# Patient Record
Sex: Male | Born: 1955 | Race: White | Hispanic: No | Marital: Married | Smoking: Never smoker
Health system: Southern US, Community
[De-identification: ages and names within clinical notes are randomized; demographics above are authoritative.]

## PROBLEM LIST (undated history)

## (undated) DIAGNOSIS — I10 Essential (primary) hypertension: Secondary | ICD-10-CM

## (undated) DIAGNOSIS — E119 Type 2 diabetes mellitus without complications: Secondary | ICD-10-CM

## (undated) DIAGNOSIS — M5010 Cervical disc disorder with radiculopathy, unspecified cervical region: Secondary | ICD-10-CM

## (undated) DIAGNOSIS — C187 Malignant neoplasm of sigmoid colon: Secondary | ICD-10-CM

## (undated) DIAGNOSIS — Z95828 Presence of other vascular implants and grafts: Secondary | ICD-10-CM

## (undated) DIAGNOSIS — G5600 Carpal tunnel syndrome, unspecified upper limb: Secondary | ICD-10-CM

## (undated) HISTORY — DX: Malignant neoplasm of sigmoid colon: C18.7

## (undated) HISTORY — PX: TONSILLECTOMY: SUR1361

## (undated) HISTORY — PX: NO PAST SURGERIES: SHX2092

## (undated) HISTORY — DX: Cervical disc disorder with radiculopathy, unspecified cervical region: M50.10

## (undated) HISTORY — DX: Carpal tunnel syndrome, unspecified upper limb: G56.00

## (undated) HISTORY — DX: Essential (primary) hypertension: I10

---
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
## 2003-07-05 ENCOUNTER — Other Ambulatory Visit: Admission: RE | Admit: 2003-07-05 | Discharge: 2003-07-05 | Payer: Self-pay | Admitting: General Surgery

## 2004-08-15 ENCOUNTER — Ambulatory Visit (HOSPITAL_COMMUNITY): Admission: RE | Admit: 2004-08-15 | Discharge: 2004-08-15 | Payer: Self-pay | Admitting: Internal Medicine

## 2006-08-25 ENCOUNTER — Encounter (INDEPENDENT_AMBULATORY_CARE_PROVIDER_SITE_OTHER): Payer: Self-pay | Admitting: General Surgery

## 2006-08-25 ENCOUNTER — Ambulatory Visit (HOSPITAL_COMMUNITY): Admission: RE | Admit: 2006-08-25 | Discharge: 2006-08-25 | Payer: Self-pay | Admitting: General Surgery

## 2007-04-01 ENCOUNTER — Ambulatory Visit (HOSPITAL_COMMUNITY): Admission: RE | Admit: 2007-04-01 | Discharge: 2007-04-01 | Payer: Self-pay | Admitting: Family Medicine

## 2007-11-09 ENCOUNTER — Ambulatory Visit (HOSPITAL_COMMUNITY): Admission: RE | Admit: 2007-11-09 | Discharge: 2007-11-09 | Payer: Self-pay | Admitting: Urology

## 2010-02-09 HISTORY — PX: OTHER SURGICAL HISTORY: SHX169

## 2010-03-19 ENCOUNTER — Emergency Department (HOSPITAL_COMMUNITY): Payer: 59

## 2010-03-19 ENCOUNTER — Ambulatory Visit (HOSPITAL_COMMUNITY)
Admission: EM | Admit: 2010-03-19 | Discharge: 2010-03-19 | Disposition: A | Discharge status: Home / Self Care | Payer: 59 | Attending: General Surgery | Admitting: General Surgery

## 2010-03-19 ENCOUNTER — Other Ambulatory Visit: Payer: Self-pay | Admitting: General Surgery

## 2010-03-19 DIAGNOSIS — K3580 Unspecified acute appendicitis: Secondary | ICD-10-CM | POA: Insufficient documentation

## 2010-03-19 DIAGNOSIS — Z79899 Other long term (current) drug therapy: Secondary | ICD-10-CM | POA: Insufficient documentation

## 2010-03-19 DIAGNOSIS — I10 Essential (primary) hypertension: Secondary | ICD-10-CM | POA: Insufficient documentation

## 2010-03-19 LAB — COMPREHENSIVE METABOLIC PANEL WITH GFR
AST: 23 U/L (ref 0–37)
Albumin: 3.6 g/dL (ref 3.5–5.2)
Alkaline Phosphatase: 47 U/L (ref 39–117)
BUN: 14 mg/dL (ref 6–23)
Calcium: 9.2 mg/dL (ref 8.4–10.5)
Creatinine, Ser: 1.18 mg/dL (ref 0.4–1.5)
GFR calc Af Amer: >60 mL/min (ref >60)
Glucose, Bld: 220 mg/dL — ABNORMAL HIGH (ref 70–99)
Potassium: 4.7 meq/L (ref 3.5–5.1)
Total Bilirubin: 1.1 mg/dL (ref 0.3–1.2)
Total Protein: 6.2 g/dL (ref 6.0–8.3)

## 2010-03-19 LAB — CBC
HCT: 35.2 % — ABNORMAL LOW (ref 39.0–52.0)
Hemoglobin: 12.4 g/dL — ABNORMAL LOW (ref 13.0–17.0)
MCH: 28.9 pg (ref 26.0–34.0)
MCHC: 35.2 g/dL (ref 30.0–36.0)
MCV: 82.1 fL (ref 78.0–100.0)
Platelets: 142 K/uL — ABNORMAL LOW (ref 150–400)
RBC: 4.29 MIL/uL (ref 4.22–5.81)
RDW: 14.1 % (ref 11.5–15.5)
WBC: 13.5 10*3/uL — ABNORMAL HIGH (ref 4.0–10.5)

## 2010-03-19 LAB — URINALYSIS, ROUTINE W REFLEX MICROSCOPIC
Bilirubin Urine: NEGATIVE
Hgb urine dipstick: NEGATIVE
Ketones, ur: NEGATIVE mg/dL
Nitrite: NEGATIVE
Protein, ur: NEGATIVE mg/dL
Specific Gravity, Urine: 1.025 (ref 1.005–1.030)
Urine Glucose, Fasting: NEGATIVE mg/dL
Urobilinogen, UA: 0.2 mg/dL (ref 0.0–1.0)
pH: 6 (ref 5.0–8.0)

## 2010-03-19 LAB — DIFFERENTIAL
Basophils Absolute: 0 K/uL (ref 0.0–0.1)
Basophils Relative: 0 % (ref 0–1)
Eosinophils Absolute: 0 10*3/uL (ref 0.0–0.7)
Eosinophils Relative: 0 % (ref 0–5)
Lymphocytes Relative: 5 % — ABNORMAL LOW (ref 12–46)
Lymphs Abs: 0.6 K/uL — ABNORMAL LOW (ref 0.7–4.0)
Monocytes Absolute: 0.6 10*3/uL (ref 0.1–1.0)
Monocytes Relative: 4 % (ref 3–12)
Neutro Abs: 12.3 K/uL — ABNORMAL HIGH (ref 1.7–7.7)
Neutrophils Relative %: 91 % — ABNORMAL HIGH (ref 43–77)

## 2010-03-19 LAB — COMPREHENSIVE METABOLIC PANEL
ALT: 31 U/L (ref 0–53)
CO2: 27 mEq/L (ref 19–32)
Chloride: 100 mEq/L (ref 96–112)
GFR calc non Af Amer: >60 mL/min (ref >60)
Sodium: 137 mEq/L (ref 135–145)

## 2010-03-19 LAB — LIPASE, BLOOD: Lipase: 35 U/L (ref 11–59)

## 2010-03-19 MED ORDER — IOHEXOL 300 MG/ML  SOLN
100.0000 mL | Freq: Once | INTRAMUSCULAR | Status: AC | PRN
  Administered 2010-03-19: 100 mL via INTRAVENOUS

## 2010-03-20 LAB — URINE CULTURE
Colony Count: NO GROWTH
Culture  Setup Time: 201202081410
Culture: NO GROWTH

## 2010-03-23 NOTE — Op Note (Signed)
NAME:  Troy Smith, Troy Smith NO.:  [id number]  MEDICAL RECORD NO.:  [id number]           PATIENT TYPE:  O  LOCATION:  DAYP                          FACILITY:  [facility]  PHYSICIAN:  Dalia Heading, M.D.  DATE OF BIRTH:  07/11/1955  DATE OF PROCEDURE:  03/19/2010 DATE OF DISCHARGE:                              OPERATIVE REPORT   PREOPERATIVE DIAGNOSIS:  Acute appendicitis.  POSTOPERATIVE DIAGNOSIS:  Acute appendicitis.  PROCEDURE:  Laparoscopic appendectomy.  SURGEON:  Dalia Heading, M.D.  ANESTHESIA:  General endotracheal.  INDICATIONS:  The patient is a 55 year old white male who presents with a 24-hour history of worsening right lower quadrant abdominal pain.  CT scan of the abdomen and pelvis reveals acute appendicitis.  The patient now comes to the operating for laparoscopic appendectomy.  The risks and benefits of the procedure including bleeding, infection, and the possibility of an open procedure were fully explained to the patient, gave informed consent.  PROCEDURE NOTE:  The patient was placed in the supine position.  After induction of general endotracheal anesthesia, the abdomen was prepped and draped using the usual sterile technique with DuraPrep.  Surgical site confirmation was performed.  A supraumbilical incision was made down the fascia.  A Veress needle was introduced into the abdominal cavity and confirmation of placement was done using the saline drop test.  The abdomen was then insufflated to 16 mmHg pressure.  An 11-mm trocar was introduced into the abdominal cavity under direct visualization without difficulty.  The patient was placed in deeper Trendelenburg position and additional 5-mm trocar was placed in the right lower quadrant and left lower quadrant regions and a 12-mm trocar was placed in the suprapubic region.  The appendix was visualized and noted be somewhat retrocecal in nature.  The mesentery was divided using the harmonic  scalpel.  There was some serous fluid around this, though there was no evidence of free perforation of the appendix.  The inflammatory portion was the distal half of the appendix.  The dissection was taken to the base of the appendix and a vascular Endo-GIA was placed across the base of the appendix and fired.  The appendix was then removed using an EndoCatch bag and sent to Pathology for further examination.  The staple line was inspected and noted to be within normal limits.  The right lower quadrant was copiously irrigated with normal saline.  Some Surgicel was placed into the area where it had been attached in the retrocecal fashion.  All air and fluid were then evacuate from the abdominal cavity prior to removal of the trocars.  All wounds were irrigated with normal saline.  All wounds were injected with 0.5% Sensorcaine.  The supraumbilical fascia was reapproximated using an 0 Vicryl interrupted suture.  All skin incisions were closed using staples.  Betadine ointment, dry sterile dressings were applied.  All tape and needle counts were correct at the end of the procedure. The patient was extubated in the operating room and went back to recovery room in awake and stable condition.  COMPLICATIONS:  None.  SPECIMEN:  None.  ESTIMATED BLOOD LOSS:  Minimal.  Dalia Heading, M.D.     MAJ/MEDQ  D:  03/19/2010  T:  03/20/2010  Job:  [id number]  cc:   Madelin Rear. Sherwood Gambler, MD Fax: [phone]  Electronically Signed by Franky Macho M.D. on 03/21/2010 01:24:58 PM

## 2010-06-24 NOTE — H&P (Signed)
NAME:  Troy Smith, Troy Smith NO.:  [id number]   MEDICAL RECORD NO.:  [id number]          PATIENT TYPE:  AMB   LOCATION:  DAY                           FACILITY:  [facility]   PHYSICIAN:  Dalia Heading, M.D.  DATE OF BIRTH:  Sep 24, 1955   DATE OF ADMISSION:  DATE OF DISCHARGE:  LH                              HISTORY & PHYSICAL   CHIEF COMPLAINT:  Need for screening colonoscopy.   HISTORY OF PRESENT ILLNESS:  Patient is a 55 year old white male who was  referred for endoscopic evaluation.  He needs a colonoscopy for  screening purposes.  No abdominal pain, weight loss, nausea, vomiting,  diarrhea, constipation, melena, hematochezia have been noted.  He has  never had a colonoscopy.  There is no family history of colon carcinoma.   PAST MEDICAL HISTORY:  Hypertension.   PAST SURGICAL HISTORY:  Unremarkable.   CURRENT MEDICATIONS:  Zestril.   ALLERGIES:  No known drug allergies.   REVIEW OF SYSTEMS:  Noncontributory.   PHYSICAL EXAMINATION:  GENERAL:  Patient is a well-developed, well-  nourished white male in no acute distress.  LUNGS:  Clear to auscultation with equal breath sounds bilaterally.  HEART:  Examination reveals a regular rate and rhythm without S3, S4,  murmurs.  ABDOMEN:  Soft, nontender, nondistended.  No hepatosplenomegaly or  masses are noted.  RECTAL:  Examination was deferred to the procedure.   IMPRESSION:  Need for screening colonoscopy.   PLAN:  The patient is scheduled for a colonoscopy on August 25, 2006.  The  risks and benefits of the procedure including bleeding and perforation  were fully explained to the patient, gave informed consent.      Dalia Heading, M.D.  Electronically Signed     MAJ/MEDQ  D:  08/03/2006  T:  08/03/2006  Job:  [id number]   cc:   Mila Homer. Sudie Bailey, M.D.  Fax: [phone]

## 2010-08-28 ENCOUNTER — Ambulatory Visit (HOSPITAL_COMMUNITY)
Admission: RE | Admit: 2010-08-28 | Discharge: 2010-08-28 | Disposition: A | Discharge status: Home / Self Care | Payer: 59 | Source: Ambulatory Visit | Attending: Family Medicine | Admitting: Family Medicine

## 2010-08-28 ENCOUNTER — Other Ambulatory Visit (HOSPITAL_COMMUNITY): Payer: Self-pay | Admitting: Family Medicine

## 2010-08-28 DIAGNOSIS — M25579 Pain in unspecified ankle and joints of unspecified foot: Secondary | ICD-10-CM

## 2010-08-28 DIAGNOSIS — M25473 Effusion, unspecified ankle: Secondary | ICD-10-CM | POA: Insufficient documentation

## 2010-08-28 DIAGNOSIS — M25476 Effusion, unspecified foot: Secondary | ICD-10-CM | POA: Insufficient documentation

## 2012-03-17 ENCOUNTER — Ambulatory Visit (HOSPITAL_COMMUNITY)
Admission: RE | Admit: 2012-03-17 | Discharge: 2012-03-17 | Disposition: A | Discharge status: Home / Self Care | Payer: 59 | Source: Ambulatory Visit | Attending: Family Medicine | Admitting: Family Medicine

## 2012-03-17 ENCOUNTER — Other Ambulatory Visit (HOSPITAL_COMMUNITY): Payer: Self-pay | Admitting: Family Medicine

## 2012-03-17 DIAGNOSIS — R05 Cough: Secondary | ICD-10-CM

## 2012-03-17 DIAGNOSIS — R059 Cough, unspecified: Secondary | ICD-10-CM | POA: Insufficient documentation

## 2014-03-06 ENCOUNTER — Other Ambulatory Visit (HOSPITAL_COMMUNITY): Payer: Self-pay | Admitting: Physical Medicine and Rehabilitation

## 2014-03-06 DIAGNOSIS — R52 Pain, unspecified: Secondary | ICD-10-CM

## 2014-03-13 ENCOUNTER — Other Ambulatory Visit (HOSPITAL_COMMUNITY): Payer: Self-pay | Admitting: Family Medicine

## 2014-03-13 DIAGNOSIS — R410 Disorientation, unspecified: Secondary | ICD-10-CM

## 2014-03-13 DIAGNOSIS — R51 Headache: Principal | ICD-10-CM

## 2014-03-13 DIAGNOSIS — R519 Headache, unspecified: Secondary | ICD-10-CM

## 2014-03-13 DIAGNOSIS — H53459 Other localized visual field defect, unspecified eye: Secondary | ICD-10-CM

## 2014-03-14 ENCOUNTER — Ambulatory Visit (HOSPITAL_COMMUNITY)
Admission: RE | Admit: 2014-03-14 | Discharge: 2014-03-14 | Disposition: A | Discharge status: Home / Self Care | Payer: 59 | Source: Ambulatory Visit | Attending: Physical Medicine and Rehabilitation | Admitting: Physical Medicine and Rehabilitation

## 2014-03-14 ENCOUNTER — Ambulatory Visit (HOSPITAL_COMMUNITY)
Admission: RE | Admit: 2014-03-14 | Discharge: 2014-03-14 | Disposition: A | Discharge status: Home / Self Care | Payer: 59 | Source: Ambulatory Visit | Attending: Family Medicine | Admitting: Family Medicine

## 2014-03-14 DIAGNOSIS — R200 Anesthesia of skin: Secondary | ICD-10-CM | POA: Diagnosis not present

## 2014-03-14 DIAGNOSIS — R519 Headache, unspecified: Secondary | ICD-10-CM

## 2014-03-14 DIAGNOSIS — H539 Unspecified visual disturbance: Secondary | ICD-10-CM | POA: Insufficient documentation

## 2014-03-14 DIAGNOSIS — M542 Cervicalgia: Secondary | ICD-10-CM | POA: Insufficient documentation

## 2014-03-14 DIAGNOSIS — R52 Pain, unspecified: Secondary | ICD-10-CM

## 2014-03-14 DIAGNOSIS — R410 Disorientation, unspecified: Secondary | ICD-10-CM | POA: Diagnosis not present

## 2014-03-14 DIAGNOSIS — R51 Headache: Secondary | ICD-10-CM | POA: Insufficient documentation

## 2014-03-14 DIAGNOSIS — H53459 Other localized visual field defect, unspecified eye: Secondary | ICD-10-CM

## 2014-03-14 LAB — POCT I-STAT CREATININE: CREATININE: 1.6 mg/dL — AB (ref 0.50–1.35)

## 2014-03-14 MED ORDER — GADOBENATE DIMEGLUMINE 529 MG/ML IV SOLN
10.0000 mL | Freq: Once | INTRAVENOUS | Status: AC | PRN
  Administered 2014-03-14: 10 mL via INTRAVENOUS

## 2014-05-02 ENCOUNTER — Encounter: Payer: Self-pay | Admitting: Neurology

## 2014-05-02 ENCOUNTER — Ambulatory Visit (INDEPENDENT_AMBULATORY_CARE_PROVIDER_SITE_OTHER): Payer: 59 | Admitting: Neurology

## 2014-05-02 ENCOUNTER — Ambulatory Visit (INDEPENDENT_AMBULATORY_CARE_PROVIDER_SITE_OTHER): Payer: Self-pay | Admitting: Neurology

## 2014-05-02 DIAGNOSIS — G5601 Carpal tunnel syndrome, right upper limb: Secondary | ICD-10-CM

## 2014-05-02 DIAGNOSIS — M5010 Cervical disc disorder with radiculopathy, unspecified cervical region: Secondary | ICD-10-CM

## 2014-05-02 DIAGNOSIS — G5600 Carpal tunnel syndrome, unspecified upper limb: Secondary | ICD-10-CM

## 2014-05-02 DIAGNOSIS — G5602 Carpal tunnel syndrome, left upper limb: Secondary | ICD-10-CM

## 2014-05-02 DIAGNOSIS — M5033 Other cervical disc degeneration, cervicothoracic region: Secondary | ICD-10-CM

## 2014-05-02 DIAGNOSIS — G5603 Carpal tunnel syndrome, bilateral upper limbs: Secondary | ICD-10-CM

## 2014-05-02 DIAGNOSIS — M4723 Other spondylosis with radiculopathy, cervicothoracic region: Secondary | ICD-10-CM

## 2014-05-02 HISTORY — DX: Cervical disc disorder with radiculopathy, unspecified cervical region: M50.10

## 2014-05-02 HISTORY — DX: Carpal tunnel syndrome, unspecified upper limb: G56.00

## 2014-05-02 NOTE — Procedures (Signed)
     HISTORY:  Troy Smith is a 59 year old gentleman with a history of diabetes. He reports onset of neck pain and left arm discomfort that began in December 2015. The patient began having numbness down the left arm and hand in February 2016. He is being evaluated for a possible neuropathy or a cervical radiculopathy.  NERVE CONDUCTION STUDIES:  Nerve conduction studies were performed on both upper extremities. The distal motor latencies for the median nerves were prolonged bilaterally, with a borderline normal motor amplitude on the left, normal on the right. The distal motor latencies for the ulnar nerves were normal bilaterally, with a low motor amplitude on the left, normal on the right. The F wave latencies for the median nerves were borderline normal on the left, normal on the right, and normal for the ulnar nerves bilaterally. The sensory latencies for the median nerves were prolonged bilaterally, normal for the ulnar nerves bilaterally.    EMG STUDIES:  EMG study was performed on the left upper extremity:  The first dorsal interosseous muscle reveals 2 to 3 K units with decreased recruitment. 2+ fibrillations and positive waves were noted. The abductor pollicis brevis muscle reveals 2 to 3 K units with mildly decreased recruitment. 1+ fibrillations and positive waves were noted. The extensor indicis proprius muscle reveals 1 to 3 K units with decreased recruitment. 3+ fibrillations and positive waves were noted. The pronator teres muscle reveals 2 to 3 K units with full recruitment. No fibrillations or positive waves were noted. The biceps muscle reveals 1 to 2 K units with full recruitment. No fibrillations or positive waves were noted. The triceps muscle reveals 2 to 4 K units with full recruitment. No fibrillations or positive waves were noted. The anterior deltoid muscle reveals 2 to 3 K units with full recruitment. No fibrillations or positive waves were noted at the lower  level, 1+ positive waves seen at the upper level. The cervical paraspinal muscles were tested at 2 levels. No abnormalities of insertional activity were seen at either level tested. There was good relaxation.   IMPRESSION:  Nerve conduction studies done on both upper extremities shows evidence of bilateral carpal tunnel syndrome of mild severity. There appears to be low motor amplitudes for the left ulnar nerve as well. EMG evaluation of the left upper extremity shows distal acute denervation with some minimal acute denervation in the upper cervical paraspinal muscles as well. This study is most consistent with a left C8 radiculopathy. Cannot completely rule out an overlying left ulnar neuropathy as well. Clinical correlation is required.   Jill Alexanders MD 05/02/2014 4:33 PM  [facility] [address] [place], [place] [address]  Phone [phone] Fax [phone]

## 2014-05-02 NOTE — Progress Notes (Signed)
Please refer to EMG and nerve conduction study procedure note. 

## 2014-05-04 ENCOUNTER — Ambulatory Visit (HOSPITAL_COMMUNITY): Payer: 59 | Admitting: Occupational Therapy

## 2014-05-11 ENCOUNTER — Encounter (HOSPITAL_COMMUNITY): Payer: Self-pay | Admitting: Occupational Therapy

## 2014-05-11 ENCOUNTER — Ambulatory Visit (HOSPITAL_COMMUNITY): Payer: 59 | Attending: Physical Medicine and Rehabilitation | Admitting: Occupational Therapy

## 2014-05-11 DIAGNOSIS — M25512 Pain in left shoulder: Secondary | ICD-10-CM

## 2014-05-11 DIAGNOSIS — M4802 Spinal stenosis, cervical region: Secondary | ICD-10-CM

## 2014-05-11 DIAGNOSIS — M542 Cervicalgia: Secondary | ICD-10-CM | POA: Diagnosis not present

## 2014-05-11 DIAGNOSIS — M6281 Muscle weakness (generalized): Secondary | ICD-10-CM

## 2014-05-11 NOTE — Therapy (Signed)
[place], [place], [address] Phone: [phone]   Fax:  [phone]  Occupational Therapy Evaluation  Patient Details  Name: Troy Smith MRN: [id number] Date of Birth: [birth year] Referring Provider:  Drema Dallas,*  Encounter Date: 05/11/2014      OT End of Session - 05/11/14 1524    Visit Number 1   Number of Visits 16   Date for OT Re-Evaluation 07/10/14  Mini-reassessment 06/09/14   Authorization Type UHC   OT Start Time 1430   OT Stop Time 1505   OT Time Calculation (min) 35 min   Activity Tolerance Patient tolerated treatment well   Behavior During Therapy [facility] for tasks assessed/performed      Past Medical History  Diagnosis Date  . Carpal tunnel syndrome 05/02/2014    Bilateral  . 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  There were no vitals filed for this visit.  Visit Diagnosis:  Spinal stenosis in cervical region  Left shoulder pain  Cervical pain (neck)  Muscle weakness of left upper extremity      Subjective Assessment - 05/11/14 1510    Symptoms S: The pain gets worse throughout the day.    Pertinent History Pt is a 59 y/o male diagnosed with stenosis and left shoulder pain, presenting with increased pain & fascial restrictions, decreased shoulder range of motion, decreased grip and pinch strength limiting ability to complete work tasks as he usually would. Pt reports he wakes up with pain around his shoulder blade and in neck region at 1/10 and it gradually progresses throughout the day. Pt reports no change in ability to complete B/IADLs, however the pain is limiting. Pt reports stretching, medication, & ice helps to relieve the pain. Dr. Niel Hummer referred pt to occupational therapy for evaluation and treatment.     Special Tests FOTO Score: 73/100 (27% impairment)   Patient Stated Goals  To be able to hold onto my instruments without dropping them.    Currently in Pain? Yes   Pain Score 4    Pain Location Shoulder  neck   Pain Orientation Left   Pain Descriptors / Indicators Aching   Pain Type Chronic pain           [facility] OT Assessment - 05/11/14 1430    Assessment   Diagnosis Stenosis & left shoulder pain   Onset Date 11/09/13   Prior Therapy None   Precautions   Precautions None   Balance Screen   Has the patient fallen in the past 6 months No   Has the patient had a decrease in activity level because of a fear of falling?  No   Is the patient reluctant to leave their home because of a fear of falling?  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    Leisure going to gym, watching tv   ADL   ADL comments Pt continues to be independent in B/IADLs, pain creates difficulty during work tasks, gym workouts, and routine house keeping. Pt reports difficulty holding onto dental instruments, and experiences discomfort when bending over patients.    Written Expression   Dominant  Hand Right   Vision - History   Baseline Vision No visual deficits   Cognition   Overall Cognitive Status Within Functional Limits for tasks assessed   ROM / Strength   AROM / PROM / Strength AROM;Strength   Palpation   Palpation Mod-max fascial restrictions in left biceps, upper arm, trapezius, scapularis, and bilateral cervical regions   AROM   Overall AROM Comments Assessed in standing, IR/ER adducted    AROM Assessment Site Shoulder   Right/Left Shoulder Left   Left Shoulder Flexion 145 Degrees   Left Shoulder ABduction 160 Degrees   Left Shoulder Internal Rotation 90 Degrees   Left Shoulder External Rotation 85 Degrees   Cervical Flexion 3cm   Cervical  Extension 2cm   Cervical - Right Side Bend 12cm   Cervical - Left Side Bend 11cm   Cervical - Right Rotation 14cm   Cervical - Left Rotation 15cm   Strength   Strength Assessment Site Shoulder   Right/Left Shoulder Left   Left Shoulder Flexion 4+/5   Left Shoulder ABduction 4+/5   Left Shoulder Internal Rotation 4/5   Left Shoulder External Rotation 4/5   Grip (lbs) R: 113   Right Hand Lateral Pinch 22 lbs   Right Hand 3 Point Pinch 19 lbs   Grip (lbs) L: 56   Left Hand Lateral Pinch 9 lbs   Left Hand 3 Point Pinch 8 lbs            OT Education - 05/11/14 1524    Education provided Yes   Education Details Cervical AROM, shoulder stretches   Person(s) Educated Patient   Methods Explanation;Demonstration;Handout   Comprehension Verbalized understanding;Returned demonstration          OT Short Term Goals - 05/11/14 1532    OT SHORT TERM GOAL #1   Title Pt will be educated on HEP.    Time 4   Period Weeks   Status New   OT SHORT TERM GOAL #2   Title Pt will decrease fascial restrictions in LUE & cervical region from max to mod amount to decrease pain.    Time 4   Period Weeks   Status New   OT SHORT TERM GOAL #3   Title Pt will increase AROM of LUE to [facility] to increase ability to complete daily and leisure tasks without pain.    Time 4   Period Weeks   Status New   OT SHORT TERM GOAL #4   Title Pt will decrease pain to 4/10 or less when completing daily activities.    Time 4   Period Weeks   Status New   OT SHORT TERM GOAL #5   Title Pt will increase LUE grip strength by 10# and pinch strength by 4# to increase ability to complete work tasks.    Time 4   Period Weeks   Status New           OT Long Term Goals - 05/11/14 1536    OT LONG TERM GOAL #1   Title Pt will return to prior level of functioning and independence in all B/IADL tasks.    Time 8   Period Weeks   Status New   OT LONG TERM GOAL #2   Title Pt will decrease pain to 2/10 or less during  all daily activities.    Time 8   Period Weeks   Status New   OT LONG TERM GOAL #3   Title Pt will increase LUE  grip strength by 15# and pinch strength by 7# to increase ability to hold onto dental instruments throughout the day.    Time 8   Period Weeks   Status New   OT LONG TERM GOAL #4   Title Pt will decrease fascial restrictions in LUE & cervical region from mod to min amounts or less.    Time 8   Period Weeks   Status New   OT LONG TERM GOAL #5   Title Pt will increase cervical AROM to WNL to increase ability to look over shoulder when driving without pain.    Time 8   Period Weeks   Status New               Plan - 05/11/14 1525    Clinical Impression Statement A: Pt is 59 y/o male presenting with increased pain and fascial restrictions, decreased shoulder range of motion, and decreased grip and pinch strength in LUE limiting ability to complete daily tasks in a normal way. Pt reports he experiences increased pain during work tasks and housekeeping tasks, as well as tingling and numbness along ulnar side of LUE at times. Pt continues to go to the gym, however is limited in ability to complete exercises due to pain. Dr. Niel Hummer referred pt to occupational therapy for evaluation and treatment.     Pt will benefit from skilled therapeutic intervention in order to improve on the following deficits (Retired) Decreased strength;Impaired sensation;Pain;Impaired UE functional use;Decreased activity tolerance;Decreased range of motion;Increased fascial restricitons;Improper body mechanics;Decreased coordination   Rehab Potential Good   OT Frequency 2x / week   OT Duration 8 weeks   OT Treatment/Interventions Self-care/ADL training;Passive range of motion;Patient/family education;Cryotherapy;Electrical Stimulation;Therapeutic exercise;Moist Heat;Manual Therapy;Therapeutic activities   Plan P: 9 hole peg test. Theraputty HEP. Skilled OT intervention to decrease pain and  restrictions and improve pain free mobility in order to return prior level of independence with B/IADLs and leisure activites. Treatment Plan: MFR and manual stretching, P/AROM exercises, grip and pinch strengthening exercises, general LUE strengthening, cervical AROM/stretching.    OT Home Exercise Plan Cervical AROM, shoulder stretches   Consulted and Agree with Plan of Care Patient        Problem List Patient Active Problem List   Diagnosis Date Noted  . Carpal tunnel syndrome 05/02/2014  . Cervical disc disorder with radiculopathy of cervical region 05/02/2014    Guadelupe Sabin, OTR/L  [phone]  05/11/2014, 3:47 PM  [place], [place], [address] Phone: [phone]   Fax:  [phone]

## 2014-05-11 NOTE — Patient Instructions (Signed)
  AROM: Lateral Neck Flexion   Slowly tilt head toward one shoulder, then the other. Hold each position __5__ seconds. Repeat __10__ times per set. Do _1___ sets per session. Do _1-2___ sessions per day.  http://orth.exer.us/296   Copyright  VHI. All rights reserved.  AROM: Neck Extension   Bend head backward. Hold __5__ seconds. Repeat _10___ times per set. Do __1__ sets per session. Do _1-2___ sessions per day.  http://orth.exer.us/300   Copyright  VHI. All rights reserved.  AROM: Neck Flexion   Bend head forward. Hold __5__ seconds. Repeat _10___ times per set. Do __1__ sets per session. Do _1-2___ sessions per day.  http://orth.exer.us/298   Copyright  VHI. All rights reserved.  AROM: Neck Rotation   Turn head slowly to look over one shoulder, then the other. Hold each position __5__ seconds. Repeat __10__ times per set. Do __1__ sets per session. Do _1-2___ sessions per day.  http://orth.exer.us/294   Copyright  VHI. All rights reserved.  arom

## 2014-05-18 ENCOUNTER — Encounter (HOSPITAL_COMMUNITY): Payer: Self-pay | Admitting: Occupational Therapy

## 2014-05-18 ENCOUNTER — Ambulatory Visit (HOSPITAL_COMMUNITY): Payer: 59 | Admitting: Occupational Therapy

## 2014-05-18 DIAGNOSIS — M25512 Pain in left shoulder: Secondary | ICD-10-CM

## 2014-05-18 DIAGNOSIS — M6281 Muscle weakness (generalized): Secondary | ICD-10-CM

## 2014-05-18 DIAGNOSIS — M542 Cervicalgia: Secondary | ICD-10-CM

## 2014-05-18 NOTE — Therapy (Signed)
[place], [place], [address] Phone: [phone]   Fax:  [phone]  Occupational Therapy Treatment  Patient Details  Name: Troy Smith MRN: [id number] Date of Birth: 01/23/1956 Referring Provider:  [name], MD  Encounter Date: 05/18/2014      OT End of Session - 05/18/14 1601    Visit Number 2   Number of Visits 16   Date for OT Re-Evaluation 07/10/14  Mini-reassessment 06/09/14   Authorization Type UHC   OT Start Time 1516   OT Stop Time 1557   OT Time Calculation (min) 41 min   Activity Tolerance Patient tolerated treatment well   Behavior During Therapy [facility] for tasks assessed/performed      Past Medical History  Diagnosis Date  . Carpal tunnel syndrome 05/02/2014    Bilateral  . 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  There were no vitals filed for this visit.  Visit Diagnosis:  Left shoulder pain  Cervical pain (neck)  Muscle weakness of left upper extremity      Subjective Assessment - 05/18/14 1518    Subjective  S: It's feeling pretty good right now, it was numb earlier.    Currently in Pain? Yes   Pain Score 2    Pain Location Shoulder   Pain Orientation Left   Pain Descriptors / Indicators Aching   Pain Type Chronic pain                    OT Treatments/Exercises (OP) - 05/18/14 1518    Exercises   Exercises Shoulder;Neck   Neck Exercises: Stretches   Upper Trapezius Stretch 3 reps;10 seconds   Levator Stretch 3 reps;10 seconds   Corner Stretch 3 reps;10 seconds   Lower Cervical/Upper Thoracic Stretch 3 reps;10 seconds   Other Neck Stretches neck flex/ext 3 reps 10 seconds each   Neck Exercises: Seated   Shoulder Shrugs 10 reps   Shoulder Rolls Backwards;Forwards;10 reps   Shoulder Exercises: Supine   Protraction PROM;5 reps;AROM;12 reps   Horizontal ABduction PROM;5  reps;AROM;12 reps   External Rotation PROM;5 reps;AROM;12 reps   Internal Rotation PROM;5 reps;AROM;12 reps   Flexion PROM;5 reps;AROM;12 reps   ABduction PROM;5 reps;AROM;12 reps   Shoulder Exercises: Standing   Protraction AROM;12 reps   Horizontal ABduction AROM;12 reps   External Rotation AROM;12 reps   Internal Rotation AROM;12 reps   Flexion AROM;12 reps   ABduction AROM;12 reps   Shoulder Exercises: ROM/Strengthening   Prot/Ret//Elev/Dep 1'   Manual Therapy   Manual Therapy Myofascial release   Myofascial Release Myofascial release to left bicep, upper arm, trapezius, and scapularis regions to decrease fascial restrictions and pain, and to increase joint mobility                   OT Short Term Goals - 05/18/14 1603    OT SHORT TERM GOAL #1   Title Pt will be educated on HEP.    Time 4   Period Weeks   Status On-going   OT SHORT TERM GOAL #2   Title Pt will decrease fascial restrictions in LUE & cervical region from max to mod amount to decrease pain.    Time 4   Period Weeks   Status On-going   OT SHORT TERM GOAL #3   Title Pt will increase AROM of LUE to [facility]  to increase ability to complete daily and leisure tasks without pain.    Time 4   Period Weeks   Status On-going   OT SHORT TERM GOAL #4   Title Pt will decrease pain to 4/10 or less when completing daily activities.    Time 4   Period Weeks   Status On-going   OT SHORT TERM GOAL #5   Title Pt will increase LUE grip strength by 10# and pinch strength by 4# to increase ability to complete work tasks.    Time 4   Period Weeks   Status On-going           OT Long Term Goals - 05/18/14 1604    OT LONG TERM GOAL #1   Title Pt will return to prior level of functioning and independence in all B/IADL tasks.    Time 8   Period Weeks   Status On-going   OT LONG TERM GOAL #2   Title Pt will decrease pain to 2/10 or less during all daily activities.    Time 8   Period Weeks   Status On-going    OT LONG TERM GOAL #3   Title Pt will increase LUE grip strength by 15# and pinch strength by 7# to increase ability to hold onto dental instruments throughout the day.    Time 8   Period Weeks   Status On-going   OT LONG TERM GOAL #4   Title Pt will decrease fascial restrictions in LUE & cervical region from mod to min amounts or less.    Time 8   Period Weeks   Status On-going   OT LONG TERM GOAL #5   Title Pt will increase cervical AROM to WNL to increase ability to look over shoulder when driving without pain.    Time 8   Period Weeks   Status On-going               Plan - 05/18/14 1601    Clinical Impression Statement A: Initiated myofascial release, manual stretching, PROM/AROM this date. Pt reports less pain today than usual during work tasks. Pt reports he has been completing his HEP every day. Patient tolerated treatment well.    Plan P: 9 hole peg test. Theraputty HEP. Add scapular theraband. Grip strengthening exercises.         Problem List Patient Active Problem List   Diagnosis Date Noted  . Carpal tunnel syndrome 05/02/2014  . Cervical disc disorder with radiculopathy of cervical region 05/02/2014    Guadelupe Sabin, OTR/L  [phone]  05/18/2014, 4:04 PM  [place], [place], [address] Phone: [phone]   Fax:  [phone]

## 2014-05-23 ENCOUNTER — Encounter (HOSPITAL_COMMUNITY): Payer: Self-pay | Admitting: Occupational Therapy

## 2014-05-23 ENCOUNTER — Ambulatory Visit (HOSPITAL_COMMUNITY): Payer: 59 | Admitting: Occupational Therapy

## 2014-05-23 DIAGNOSIS — M542 Cervicalgia: Secondary | ICD-10-CM

## 2014-05-23 DIAGNOSIS — M6281 Muscle weakness (generalized): Secondary | ICD-10-CM

## 2014-05-23 DIAGNOSIS — M25512 Pain in left shoulder: Secondary | ICD-10-CM

## 2014-05-23 NOTE — Patient Instructions (Signed)
Home Exercises Program Theraputty Exercises  Do the following exercises 2 times a day using your affected hand.  1. Roll putty into a ball.  2. Make into a pancake.  3. Roll putty into a roll.  4. Pinch along log with first finger and thumb.   5. Make into a ball.  6. Roll it back into a log.   7. Pinch using thumb and side of first finger.  8. Roll into a ball, then flatten into a pancake.  9. Using your fingers, make putty into a mountain.   

## 2014-05-23 NOTE — Therapy (Signed)
[place], [place], [address] Phone: [phone]   Fax:  [phone]  Occupational Therapy Treatment  Patient Details  Name: Troy Smith MRN: [id number] Date of Birth: December 05, 1955 Referring Provider:  Drema Dallas,*  Encounter Date: 05/23/2014      OT End of Session - 05/23/14 1446    Visit Number 3   Number of Visits 16   Date for OT Re-Evaluation 07/10/14  Mini-reassessment 06/09/14   Authorization Type U[employer]   OT Start Time 1300   OT Stop Time 1343   OT Time Calculation (min) 43 min   Activity Tolerance Patient tolerated treatment well   Behavior During Therapy [facility] for tasks assessed/performed      Past Medical History  Diagnosis Date  . Carpal tunnel syndrome 05/02/2014    Bilateral  . 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  There were no vitals filed for this visit.  Visit Diagnosis:  Left shoulder pain  Cervical pain (neck)  Muscle weakness of left upper extremity      Subjective Assessment - 05/23/14 1257    Subjective  S: It's not feeling too bad today, a little tingling in my hand.    Currently in Pain? No/denies           [facility] OT Assessment - 05/23/14 1317    Assessment   Diagnosis Stenosis & left shoulder pain   Precautions   Precautions None   Coordination   9 Hole Peg Test Right;Left   Right 9 Hole Peg Test 21"   Left 9 Hole Peg Test 24"                  OT Treatments/Exercises (OP) - 05/23/14 1314    Exercises   Exercises Shoulder;Neck;Hand   Neck Exercises: Seated   Shoulder Shrugs 10 reps   Shoulder Exercises: Supine   Protraction PROM;5 reps;AROM;12 reps   Horizontal ABduction PROM;5 reps;AROM;12 reps   External Rotation PROM;5 reps;AROM;12 reps   Internal Rotation PROM;5 reps;AROM;12 reps   Flexion PROM;5 reps;AROM;12 reps   ABduction PROM;5 reps;AROM;12  reps   Shoulder Exercises: Standing   Protraction AROM;12 reps   Horizontal ABduction AROM;12 reps   External Rotation AROM;12 reps   Internal Rotation AROM;12 reps   Flexion AROM;12 reps   ABduction AROM;12 reps   Hand Exercises   Theraputty - Flatten red   Theraputty - Roll red   Theraputty - Grip red-pronated & supinated   Theraputty - Pinch 3 point   Hand Gripper with Large Beads 50# 6/6 beads   Hand Gripper with Medium Beads 55# 13/13 beads   Hand Gripper with Small Beads 55# 17/17 Beads   Manual Therapy   Manual Therapy Myofascial release   Myofascial Release Myofascial release to left bicep, upper arm, trapezius, and scapularis regions to decrease fascial restrictions and pain, and to increase joint mobility                OT Education - 05/23/14 1445    Education provided Yes   Education Details red theraputty   Person(s) Educated Patient   Methods Explanation;Demonstration;Handout   Comprehension Verbalized understanding;Returned demonstration          OT Short Term Goals - 05/18/14 1603    OT SHORT TERM GOAL #1   Title Pt will be educated on HEP.    Time  4   Period Weeks   Status On-going   OT SHORT TERM GOAL #2   Title Pt will decrease fascial restrictions in LUE & cervical region from max to mod amount to decrease pain.    Time 4   Period Weeks   Status On-going   OT SHORT TERM GOAL #3   Title Pt will increase AROM of LUE to [facility] to increase ability to complete daily and leisure tasks without pain.    Time 4   Period Weeks   Status On-going   OT SHORT TERM GOAL #4   Title Pt will decrease pain to 4/10 or less when completing daily activities.    Time 4   Period Weeks   Status On-going   OT SHORT TERM GOAL #5   Title Pt will increase LUE grip strength by 10# and pinch strength by 4# to increase ability to complete work tasks.    Time 4   Period Weeks   Status On-going           OT Long Term Goals - 05/18/14 1604    OT LONG TERM GOAL  #1   Title Pt will return to prior level of functioning and independence in all B/IADL tasks.    Time 8   Period Weeks   Status On-going   OT LONG TERM GOAL #2   Title Pt will decrease pain to 2/10 or less during all daily activities.    Time 8   Period Weeks   Status On-going   OT LONG TERM GOAL #3   Title Pt will increase LUE grip strength by 15# and pinch strength by 7# to increase ability to hold onto dental instruments throughout the day.    Time 8   Period Weeks   Status On-going   OT LONG TERM GOAL #4   Title Pt will decrease fascial restrictions in LUE & cervical region from mod to min amounts or less.    Time 8   Period Weeks   Status On-going   OT LONG TERM GOAL #5   Title Pt will increase cervical AROM to WNL to increase ability to look over shoulder when driving without pain.    Time 8   Period Weeks   Status On-going               Plan - 05/23/14 1446    Clinical Impression Statement A: Continued manual therapy, PROM/AROM this date. Conducted 9 hole peg test, no significant coordination deficits noted. Added grip strengthening exercises. Pt required verbal cuing to slow down during exercises. Pt reports less soreness in arm and shoulder today versus last session. Continues to experience tingling along forearm into fingers. Provided and educated pt on red theraputty HEP. Pt tolerated treatment well.    Plan P: Follow up on theraputty HEP. Resume cervical stretches, provide HEP if appropriate.         Problem List Patient Active Problem List   Diagnosis Date Noted  . Carpal tunnel syndrome 05/02/2014  . Cervical disc disorder with radiculopathy of cervical region 05/02/2014    Guadelupe Sabin, OTR/L  [phone]  05/23/2014, 2:51 PM  [place], [place], [address] Phone: [phone]   Fax:  [phone]

## 2014-05-25 ENCOUNTER — Encounter (HOSPITAL_COMMUNITY): Payer: Self-pay | Admitting: Occupational Therapy

## 2014-05-25 ENCOUNTER — Ambulatory Visit (HOSPITAL_COMMUNITY): Payer: 59 | Admitting: Occupational Therapy

## 2014-05-25 DIAGNOSIS — M6281 Muscle weakness (generalized): Secondary | ICD-10-CM

## 2014-05-25 DIAGNOSIS — M542 Cervicalgia: Secondary | ICD-10-CM

## 2014-05-25 DIAGNOSIS — M25512 Pain in left shoulder: Secondary | ICD-10-CM

## 2014-05-25 NOTE — Therapy (Signed)
[place], [place], [address] Phone: [phone]   Fax:  [phone]  Occupational Therapy Treatment  Patient Details  Name: Troy Smith MRN: [id number] Date of Birth: 02-03-1956 Referring Provider:  Drema Dallas,*  Encounter Date: 05/25/2014      OT End of Session - 05/25/14 1603    Visit Number 4   Number of Visits 16   Date for OT Re-Evaluation 07/10/14  Mini-reassessment 06/09/14   Authorization Type UHC   OT Start Time 1515   OT Stop Time 1601   OT Time Calculation (min) 46 min   Activity Tolerance Patient tolerated treatment well   Behavior During Therapy [facility] for tasks assessed/performed      Past Medical History  Diagnosis Date  . Carpal tunnel syndrome 05/02/2014    Bilateral  . 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  There were no vitals filed for this visit.  Visit Diagnosis:  Left shoulder pain  Cervical pain (neck)  Muscle weakness of left upper extremity      Subjective Assessment - 05/25/14 1606    Subjective  S: I'm not feeling it much today, but I only worked until two.    Currently in Pain? Yes   Pain Score 2    Pain Location Shoulder   Pain Orientation Left   Pain Descriptors / Indicators Aching   Pain Type Chronic pain            [facility] OT Assessment - 05/25/14 1603    Assessment   Diagnosis Stenosis & left shoulder pain   Precautions   Precautions None                OT Treatments/Exercises (OP) - 05/25/14 1534    Exercises   Exercises Shoulder;Neck;Hand   Neck Exercises: Stretches   Upper Trapezius Stretch 3 reps;10 seconds   Levator Stretch 3 reps;10 seconds   Neck Stretch 3 reps;10 seconds   Corner Stretch 3 reps;10 seconds   Lower Cervical/Upper Thoracic Stretch 3 reps;10 seconds   Neck Exercises: Seated   Shoulder Shrugs 10 reps   Shoulder Rolls  Backwards;Forwards;10 reps   Shoulder Exercises: Standing   Protraction AROM;15 reps   Horizontal ABduction AROM;15 reps   External Rotation AROM;15 reps   Internal Rotation AROM;15 reps   Flexion AROM;15 reps   ABduction AROM;15 reps   Hand Exercises   Theraputty - Roll red   Theraputty - Grip red-pronated & supinated   Theraputty - Pinch 3 point & lateral   Hand Gripper with Large Beads 55# 6/6   Hand Gripper with Medium Beads 55# 13/13 beads   Hand Gripper with Small Beads 55# 17/17 Beads  3 rest breaks   Sponges 22, 22   Manual Therapy   Manual Therapy Myofascial release   Myofascial Release Myofascial release to left bicep, upper arm, trapezius, and scapularis regions to decrease fascial restrictions and pain, and to increase joint mobility                 OT Education - 05/25/14 1603    Education provided Yes   Education Details Cervical spine stretches   Person(s) Educated Patient   Methods Explanation;Demonstration;Handout   Comprehension Verbalized understanding;Returned demonstration          OT Short Term Goals - 05/18/14 1603    OT SHORT TERM GOAL #1  Title Pt will be educated on HEP.    Time 4   Period Weeks   Status On-going   OT SHORT TERM GOAL #2   Title Pt will decrease fascial restrictions in LUE & cervical region from max to mod amount to decrease pain.    Time 4   Period Weeks   Status On-going   OT SHORT TERM GOAL #3   Title Pt will increase AROM of LUE to [facility] to increase ability to complete daily and leisure tasks without pain.    Time 4   Period Weeks   Status On-going   OT SHORT TERM GOAL #4   Title Pt will decrease pain to 4/10 or less when completing daily activities.    Time 4   Period Weeks   Status On-going   OT SHORT TERM GOAL #5   Title Pt will increase LUE grip strength by 10# and pinch strength by 4# to increase ability to complete work tasks.    Time 4   Period Weeks   Status On-going           OT Long Term  Goals - 05/18/14 1604    OT LONG TERM GOAL #1   Title Pt will return to prior level of functioning and independence in all B/IADL tasks.    Time 8   Period Weeks   Status On-going   OT LONG TERM GOAL #2   Title Pt will decrease pain to 2/10 or less during all daily activities.    Time 8   Period Weeks   Status On-going   OT LONG TERM GOAL #3   Title Pt will increase LUE grip strength by 15# and pinch strength by 7# to increase ability to hold onto dental instruments throughout the day.    Time 8   Period Weeks   Status On-going   OT LONG TERM GOAL #4   Title Pt will decrease fascial restrictions in LUE & cervical region from mod to min amounts or less.    Time 8   Period Weeks   Status On-going   OT LONG TERM GOAL #5   Title Pt will increase cervical AROM to WNL to increase ability to look over shoulder when driving without pain.    Time 8   Period Weeks   Status On-going               Plan - 05/25/14 1603    Clinical Impression Statement A: Added sponges, increased gripper resistance to 55# with all size beads. Resumed cervical stretches. Provided and educated pt on cervical spine stretch HEP. Pt reports less pain/soreness today, possibly due to it being a shorter day at work. Pt tolerated treatment well. Pt is completing theraputty HEP.    Plan P: Follow up on cervical spine HEP. Increase gripper resistance for large & medium beads during grip strengthening. Add scapular theraband if appropriate.         Problem List Patient Active Problem List   Diagnosis Date Noted  . Carpal tunnel syndrome 05/02/2014  . Cervical disc disorder with radiculopathy of cervical region 05/02/2014    Guadelupe Sabin, OTR/L  [phone]  05/25/2014, 4:09 PM  [place] [address] [place], [place], [address] Phone: [phone]   Fax:  [phone]

## 2014-05-25 NOTE — Patient Instructions (Signed)
Flexibility: Neck Stretch   Grasp left arm above wrist and pull down across body while gently tilting head same direction. Hold for 3 seconds. Repeat 10 times.   Levator Scapula Stretch   Place left hand on same side shoulder blade. With other hand, gently stretch head down and away. Hold 3 seconds. Repeat 10 times.    Flexibility: Neck Retraction   Pull head straight back, keeping eyes and jaw level. *Give yourself a double chin.* Hold 3 seconds. Repeat 10 times.   Lower Cervical / Upper Thoracic Stretch   Clasp hands together in front with arms extended. Gently pull shoulder blades apart and bend head forward. Hold 3 seconds. Repeat 10 times.

## 2014-05-30 ENCOUNTER — Encounter (HOSPITAL_COMMUNITY): Payer: Self-pay | Admitting: Occupational Therapy

## 2014-05-30 ENCOUNTER — Ambulatory Visit (HOSPITAL_COMMUNITY): Payer: 59 | Admitting: Occupational Therapy

## 2014-05-30 DIAGNOSIS — M25512 Pain in left shoulder: Secondary | ICD-10-CM

## 2014-05-30 DIAGNOSIS — M542 Cervicalgia: Secondary | ICD-10-CM

## 2014-05-30 DIAGNOSIS — M6281 Muscle weakness (generalized): Secondary | ICD-10-CM

## 2014-05-30 NOTE — Therapy (Signed)
[place], [place], [address] Phone: [phone]   Fax:  [phone]  Occupational Therapy Treatment  Patient Details  Name: Troy Smith MRN: [id number] Date of Birth: February 20, 1955 Referring Provider:  Drema Dallas,*  Encounter Date: 05/30/2014      OT End of Session - 05/30/14 1436    Visit Number 5   Number of Visits 16   Date for OT Re-Evaluation 07/10/14  Mini-reassessment 06/09/14   Authorization Type UHC   OT Start Time 1301   OT Stop Time 1345   OT Time Calculation (min) 44 min   Activity Tolerance Patient tolerated treatment well   Behavior During Therapy [facility] for tasks assessed/performed      Past Medical History  Diagnosis Date  . Carpal tunnel syndrome 05/02/2014    Bilateral  . 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  There were no vitals filed for this visit.  Visit Diagnosis:  Left shoulder pain  Cervical pain (neck)  Muscle weakness of left upper extremity      Subjective Assessment - 05/30/14 1304    Subjective  S: The pain in my shoulder blade is much better. I haven't taken medicine in a few days.    Currently in Pain? No/denies            [facility] OT Assessment - 05/30/14 1436    Assessment   Diagnosis Stenosis & left shoulder pain   Precautions   Precautions None                  OT Treatments/Exercises (OP) - 05/30/14 1318    Exercises   Exercises Shoulder;Neck;Hand   Neck Exercises: Seated   Shoulder Shrugs 15 reps   Shoulder Rolls Backwards;Forwards;15 reps   Shoulder Exercises: Supine   Protraction PROM;5 reps;AROM;15 reps   Horizontal ABduction PROM;5 reps;AROM;15 reps   External Rotation PROM;5 reps;AROM;15 reps   Internal Rotation PROM;5 reps;AROM;15 reps   Flexion PROM;5 reps;AROM;15 reps   ABduction PROM;5 reps;AROM;15 reps   Shoulder Exercises:  Standing   Protraction AROM;15 reps   Horizontal ABduction AROM;15 reps   External Rotation AROM;15 reps   Internal Rotation AROM;15 reps   Flexion AROM;15 reps   ABduction AROM;15 reps   Extension Theraband;10 reps   Theraband Level (Shoulder Extension) Level 2 (Red)   Row Theraband;10 reps   Theraband Level (Shoulder Row) Level 2 (Red)   Retraction Theraband;10 reps   Theraband Level (Shoulder Retraction) Level 2 (Red)   Hand Exercises   Theraputty - Roll red   Theraputty - Grip red-pronated & supinated   Hand Gripper with Large Beads 61# 6/6   Hand Gripper with Medium Beads 4/13 61#; 13/13 55#   Hand Gripper with Small Beads 55# 17/17   Sponges 26, 26   Manual Therapy   Manual Therapy Myofascial release   Myofascial Release Myofascial release to left bicep, upper arm, trapezius, and scapularis regions to decrease fascial restrictions and pain, and to increase joint mobility                 OT Education - 05/30/14 1439    Education provided Yes   Education Details scapular theraband    Person(s) Educated Patient   Methods Explanation;Demonstration;Handout   Comprehension Verbalized understanding;Returned demonstration          OT Short Term Goals - 05/18/14 1603  OT SHORT TERM GOAL #1   Title Pt will be educated on HEP.    Time 4   Period Weeks   Status On-going   OT SHORT TERM GOAL #2   Title Pt will decrease fascial restrictions in LUE & cervical region from max to mod amount to decrease pain.    Time 4   Period Weeks   Status On-going   OT SHORT TERM GOAL #3   Title Pt will increase AROM of LUE to [facility] to increase ability to complete daily and leisure tasks without pain.    Time 4   Period Weeks   Status On-going   OT SHORT TERM GOAL #4   Title Pt will decrease pain to 4/10 or less when completing daily activities.    Time 4   Period Weeks   Status On-going   OT SHORT TERM GOAL #5   Title Pt will increase LUE grip strength by 10# and pinch  strength by 4# to increase ability to complete work tasks.    Time 4   Period Weeks   Status On-going           OT Long Term Goals - 05/18/14 1604    OT LONG TERM GOAL #1   Title Pt will return to prior level of functioning and independence in all B/IADL tasks.    Time 8   Period Weeks   Status On-going   OT LONG TERM GOAL #2   Title Pt will decrease pain to 2/10 or less during all daily activities.    Time 8   Period Weeks   Status On-going   OT LONG TERM GOAL #3   Title Pt will increase LUE grip strength by 15# and pinch strength by 7# to increase ability to hold onto dental instruments throughout the day.    Time 8   Period Weeks   Status On-going   OT LONG TERM GOAL #4   Title Pt will decrease fascial restrictions in LUE & cervical region from mod to min amounts or less.    Time 8   Period Weeks   Status On-going   OT LONG TERM GOAL #5   Title Pt will increase cervical AROM to WNL to increase ability to look over shoulder when driving without pain.    Time 8   Period Weeks   Status On-going               Plan - 05/30/14 1436    Clinical Impression Statement A: Added scapular theraband, increased gripper resistance for large beads, unable to complete all medium beads with increased resistance. Provided and educated pt on scapular theraband HEP. Pt reports he is completing his previous HEPs with no problems. Pt reports he is having less pain under his shoulder blade and in his upper arm; hasn't had to take any prescription medications in several days. Pt tolerated treatment well.    Plan P: Follow up on scapular theraband HEP. Attempt medium beads at 61#. Add x to v arms.         Problem List Patient Active Problem List   Diagnosis Date Noted  . Carpal tunnel syndrome 05/02/2014  . Cervical disc disorder with radiculopathy of cervical region 05/02/2014    Guadelupe Sabin, OTR/L  [phone]  05/30/2014, 2:40 PM  [place] [address] [place], [place], [address] Phone: [phone]   Fax:  [phone]

## 2014-05-30 NOTE — Patient Instructions (Signed)

## 2014-06-05 ENCOUNTER — Telehealth (HOSPITAL_COMMUNITY): Payer: Self-pay

## 2014-06-05 NOTE — Telephone Encounter (Signed)
4/26 OT asked that he be called to schedule more appointments.  He was supposed to do that after his last appointment but he didn't

## 2014-07-04 ENCOUNTER — Ambulatory Visit (HOSPITAL_COMMUNITY): Payer: 59 | Attending: Physical Medicine and Rehabilitation

## 2014-07-04 DIAGNOSIS — M6281 Muscle weakness (generalized): Secondary | ICD-10-CM | POA: Insufficient documentation

## 2014-07-04 DIAGNOSIS — M542 Cervicalgia: Secondary | ICD-10-CM | POA: Diagnosis not present

## 2014-07-04 DIAGNOSIS — M25512 Pain in left shoulder: Secondary | ICD-10-CM | POA: Diagnosis not present

## 2014-07-04 NOTE — Therapy (Signed)
[place], [place], [address] Phone: [phone]   Fax:  [phone]  Occupational Therapy Treatment & Reassessment  Patient Details  Name: Troy Smith MRN: [id number] Date of Birth: May 20, 1955 Referring Provider:  [name], MD  Encounter Date: 07/04/2014      OT End of Session - 07/04/14 1729    Visit Number 6   Number of Visits 16   Date for OT Re-Evaluation 08/29/14  Mini reassessment: 08/01/14   Authorization Type UHC   OT Start Time 1600   OT Stop Time 1645   OT Time Calculation (min) 45 min   Activity Tolerance Patient tolerated treatment well   Behavior During Therapy [facility] for tasks assessed/performed      Past Medical History  Diagnosis Date  . Carpal tunnel syndrome 05/02/2014    Bilateral  . 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  There were no vitals filed for this visit.  Visit Diagnosis:  Left shoulder pain - Plan: Ot plan of care cert/re-cert, CANCELED: Ot plan of care cert/re-cert  Cervical pain (neck) - Plan: Ot plan of care cert/re-cert, CANCELED: Ot plan of care cert/re-cert  Muscle weakness of left upper extremity - Plan: Ot plan of care cert/re-cert, CANCELED: Ot plan of care cert/re-cert      Subjective Assessment - 07/04/14 1727    Subjective  S: This past week I started to notice just slight numbness in my distal fingertip on my right hand.    Currently in Pain? Yes   Pain Score 2    Pain Location Shoulder   Pain Orientation Left            [facility] OT Assessment - 07/04/14 1604    Assessment   Diagnosis Stenosis & left shoulder pain   Precautions   Precautions None   AROM   Overall AROM Comments Assessed in standing, IR/ER adducted    AROM Assessment Site Shoulder   Right/Left Shoulder Left   Left Shoulder Flexion 174 Degrees  on eval: 145   Left Shoulder ABduction 171  Degrees  on eval: 160   Left Shoulder Internal Rotation 90 Degrees  same at eval   Left Shoulder External Rotation 90 Degrees  on eval: 85   Cervical Flexion 90 degrees   Cervical Extension 70 degrees   Cervical - Right Side Bend 32 degrees   Cervical - Left Side Bend 40 degrees   Cervical - Right Rotation 55 degrees   Cervical - Left Rotation 72 degrees   Strength   Left Shoulder Flexion 5/5   Left Shoulder ABduction 5/5   Left Shoulder Internal Rotation 4+/5   Left Shoulder External Rotation 5/5   Left Hand Grip (lbs) 65  on eval: 56   Left Hand Lateral Pinch 11 lbs  on eval: 9   Left Hand 3 Point Pinch 13 lbs  on eval: 8                  OT Treatments/Exercises (OP) - 07/04/14 1643    Exercises   Exercises Shoulder;Neck;Hand   Shoulder Exercises: Supine   Protraction PROM;5 reps   Horizontal ABduction PROM;5 reps   External Rotation PROM;5 reps   Internal Rotation PROM;5 reps   Flexion PROM;5 reps   ABduction PROM;5 reps   Shoulder Exercises: ROM/Strengthening   "W" Arms 15X   X to V  Arms 15X   Manual Therapy   Manual Therapy Myofascial release   Myofascial Release Myofascial release to left bicep, upper arm, trapezius,scapularis and bilateral cervical regions to decrease fascial restrictions and pain, and to increase joint mobility                   OT Short Term Goals - 07/04/14 1617    OT SHORT TERM GOAL #1   Title Pt will be educated on HEP.    Time 4   Period Weeks   Status Achieved   OT SHORT TERM GOAL #2   Title Pt will decrease fascial restrictions in LUE & cervical region from max to mod amount to decrease pain.    Time 4   Period Weeks   Status Achieved   OT SHORT TERM GOAL #3   Title Pt will increase AROM of LUE to WFL to increase ability to complete daily and leisure tasks without pain.    Time 4   Period Weeks   Status Achieved   OT SHORT TERM GOAL #4   Title Pt will decrease pain to 4/10 or less when completing daily  activities.    Time 4   Period Weeks   Status On-going   OT SHORT TERM GOAL #5   Title Pt will increase LUE grip strength by 10# and pinch strength by 4# to increase ability to complete work tasks.    Time 4   Period Weeks   Status Partially Met           OT Long Term Goals - 07/04/14 1622    OT LONG TERM GOAL #1   Title Pt will return to prior level of functioning and independence in all B/IADL tasks.    Time 8   Period Weeks   Status Achieved   OT LONG TERM GOAL #2   Title Pt will decrease pain to 2/10 or less during all daily activities.    Time 8   Period Weeks   Status On-going   OT LONG TERM GOAL #3   Title Pt will increase LUE grip strength by 15# and pinch strength by 7# to increase ability to hold onto dental instruments throughout the day.    Time 8   Period Weeks   Status On-going   OT LONG TERM GOAL #4   Title Pt will decrease fascial restrictions in LUE & cervical region from mod to min amounts or less.    Time 8   Period Weeks   Status On-going   OT LONG TERM GOAL #5   Title Pt will increase cervical AROM to WNL to increase ability to look over shoulder when driving without pain.    Time 8   Period Weeks   Status Partially Met               Plan - 07/04/14 1730    Clinical Impression Statement A: Reassessment completed this date. Pt met 3/5 STGs and another one partly met. Patient reports that his pain has decreased since he has started therapy. All measurements have increased.   Plan P: Attempt medium beads at 61#.        Problem List Patient Active Problem List   Diagnosis Date Noted  . Carpal tunnel syndrome 05/02/2014  . Cervical disc disorder with radiculopathy of cervical region 05/02/2014     , OTR/L,CBIS  [phone]  07/04/2014, 6:00 PM  [facility] [address] [place], [place],   [address] Phone: [phone]   Fax:  [phone]    

## 2014-07-11 ENCOUNTER — Ambulatory Visit (HOSPITAL_COMMUNITY): Payer: 59 | Attending: Physical Medicine and Rehabilitation

## 2014-07-11 DIAGNOSIS — M542 Cervicalgia: Secondary | ICD-10-CM | POA: Diagnosis not present

## 2014-07-11 DIAGNOSIS — M25512 Pain in left shoulder: Secondary | ICD-10-CM | POA: Diagnosis not present

## 2014-07-11 DIAGNOSIS — M6281 Muscle weakness (generalized): Secondary | ICD-10-CM | POA: Insufficient documentation

## 2014-07-11 NOTE — Therapy (Signed)
[place], [place], [address] Phone: [phone]   Fax:  [phone]  Occupational Therapy Treatment  Patient Details  Name: Troy Smith MRN: [id number] Date of Birth: 1955/07/04 Referring Provider:  [name], MD  Encounter Date: 07/11/2014      OT End of Session - 07/11/14 1611    Visit Number 7   Number of Visits 16   Date for OT Re-Evaluation 08/29/14  Mini reassessment: 08/01/14   Authorization Type U[employer]   OT Start Time 1526   OT Stop Time 1605   OT Time Calculation (min) 39 min   Activity Tolerance Patient tolerated treatment well   Behavior During Therapy [facility] for tasks assessed/performed      Past Medical History  Diagnosis Date  . Carpal tunnel syndrome 05/02/2014    Bilateral  . 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  There were no vitals filed for this visit.  Visit Diagnosis:  Left shoulder pain  Cervical pain (neck)  Muscle weakness of left upper extremity      Subjective Assessment - 07/11/14 1554    Subjective  S: This week has been a busy one at work for [employer] feeling it.    Currently in Pain? Yes   Pain Score 3    Pain Location Shoulder   Pain Orientation Left   Pain Descriptors / Indicators Aching   Pain Type Chronic pain            [facility] OT Assessment - 07/11/14 1547    Assessment   Diagnosis Stenosis & left shoulder pain   Precautions   Precautions None                  OT Treatments/Exercises (OP) - 07/11/14 1547    Exercises   Exercises Shoulder;Neck;Hand   Neck Exercises: Seated   Shoulder Rolls Backwards;15 reps   Shoulder Exercises: Standing   Extension Theraband;10 reps   Theraband Level (Shoulder Extension) Level 3 (Green)   Row [employer] reps   Theraband Level (Shoulder Row) Level 3 (Green)   Retraction Theraband;10 reps   Theraband Level (Shoulder  Retraction) Level 3 (Green)   Shoulder Exercises: ROM/Strengthening   UBE (Upper Arm Bike) Level 1 3' forward 3' reverse   "W" Arms 15X   X to V Arms 15X   Manual Therapy   Manual Therapy Myofascial release   Myofascial Release Myofascial release to left bicep, upper arm, trapezius,scapularis and bilateral cervical regions to decrease fascial restrictions and pain, and to increase joint mobility                   OT Short Term Goals - 07/11/14 1614    OT SHORT TERM GOAL #1   Title Pt will be educated on HEP.    Time 4   Period Weeks   OT SHORT TERM GOAL #2   Title Pt will decrease fascial restrictions in LUE & cervical region from max to mod amount to decrease pain.    Time 4   Period Weeks   OT SHORT TERM GOAL #3   Title Pt will increase AROM of LUE to [facility] to increase ability to complete daily and leisure tasks without pain.    Time 4   Period Weeks   OT SHORT TERM GOAL #4   Title Pt will decrease pain to 4/10 or less  when completing daily activities.    Time 4   Period Weeks   Status On-going   OT SHORT TERM GOAL #5   Title Pt will increase LUE grip strength by 10# and pinch strength by 4# to increase ability to complete work tasks.    Time 4   Period Weeks   Status Partially Met           OT Long Term Goals - 07/11/14 1614    OT LONG TERM GOAL #1   Title Pt will return to prior level of functioning and independence in all B/IADL tasks.    Time 8   Period Weeks   OT LONG TERM GOAL #2   Title Pt will decrease pain to 2/10 or less during all daily activities.    Time 8   Period Weeks   Status On-going   OT LONG TERM GOAL #3   Title Pt will increase LUE grip strength by 15# and pinch strength by 7# to increase ability to hold onto dental instruments throughout the day.    Time 8   Period Weeks   Status On-going   OT LONG TERM GOAL #4   Title Pt will decrease fascial restrictions in LUE & cervical region from mod to min amounts or less.    Time 8    Period Weeks   Status On-going   OT LONG TERM GOAL #5   Title Pt will increase cervical AROM to WNL to increase ability to look over shoulder when driving without pain.    Time 8   Period Weeks   Status Partially Met               Plan - 07/11/14 1611    Clinical Impression Statement A:Pt reporting increased numbness and pain that is starting his left shoulder and then radiating down his arm to his 3rd, 4th, and 5th digits. Possibly sounds like an ulnar nerve entrapment?   Plan P: Add ulnar nerve glides and add to HEP. Attempt medium beads at 61#.        Problem List Patient Active Problem List   Diagnosis Date Noted  . Carpal tunnel syndrome 05/02/2014  . Cervical disc disorder with radiculopathy of cervical region 05/02/2014    Ailene Ravel, OTR/L,CBIS  [phone]  07/11/2014, 4:15 PM  [place], [place], [address] Phone: [phone]   Fax:  [phone]

## 2014-07-13 ENCOUNTER — Encounter (HOSPITAL_COMMUNITY): Payer: 59 | Admitting: Occupational Therapy

## 2014-07-18 ENCOUNTER — Ambulatory Visit (HOSPITAL_COMMUNITY): Payer: 59 | Admitting: Occupational Therapy

## 2014-07-18 DIAGNOSIS — M25512 Pain in left shoulder: Secondary | ICD-10-CM

## 2014-07-18 DIAGNOSIS — M542 Cervicalgia: Secondary | ICD-10-CM

## 2014-07-18 DIAGNOSIS — M6281 Muscle weakness (generalized): Secondary | ICD-10-CM

## 2014-07-18 NOTE — Therapy (Signed)
[place], [place], [address] Phone: [phone]   Fax:  [phone]  Occupational Therapy Treatment  Patient Details  Name: Troy Smith MRN: [id number] Date of Birth: [birth year] Referring Provider:  [name], MD  Encounter Date: 07/18/2014      OT End of Session - 07/18/14 1605    Visit Number 8   Number of Visits 16   Date for OT Re-Evaluation 08/29/14  Mini reassessment: 08/01/14   Authorization Type UHC   OT Start Time 1519   OT Stop Time 1601   OT Time Calculation (min) 42 min   Activity Tolerance Patient tolerated treatment well   Behavior During Therapy [facility] for tasks assessed/performed      Past Medical History  Diagnosis Date  . Carpal tunnel syndrome 05/02/2014    Bilateral  . 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  There were no vitals filed for this visit.  Visit Diagnosis:  Left shoulder pain  Cervical pain (neck)  Muscle weakness of left upper extremity      Subjective Assessment - 07/18/14 1520    Subjective  S: The numbness and tingling come and go in my arm.    Currently in Pain? Yes   Pain Score 4    Pain Location Shoulder   Pain Orientation Left   Pain Descriptors / Indicators Aching   Pain Type Chronic pain            [facility] OT Assessment - 07/18/14 1518    Assessment   Diagnosis Stenosis & left shoulder pain   Precautions   Precautions None          OT Treatments/Exercises (OP) - 07/18/14 1521    Exercises   Exercises Shoulder;Neck;Hand   Neck Exercises: Stretches   Upper Trapezius Stretch 3 reps;10 seconds   Levator Stretch 3 reps;10 seconds   Neck Exercises: Seated   Shoulder Rolls Backwards;15 reps   Shoulder Exercises: Supine   Protraction PROM;5 reps   Horizontal ABduction PROM;5 reps   External Rotation PROM;5 reps   Internal Rotation PROM;5 reps   Flexion  PROM;5 reps   ABduction PROM;5 reps   Shoulder Exercises: Seated   Other Seated Exercises ulnar nerve glides 15X   Shoulder Exercises: ROM/Strengthening   UBE (Upper Arm Bike) Level 1 3' forward 3' reverse   "W" Arms 15X   X to V Arms 15X   Hand Exercises   Hand Gripper with Medium Beads 8/13 61# 5/8 55#   Hand Gripper with Small Beads 17/17 55#   Manual Therapy   Manual Therapy Myofascial release   Myofascial Release Myofascial release to left bicep, upper arm, trapezius,scapularis and bilateral cervical regions to decrease fascial restrictions and pain, and to increase joint mobility             OT Short Term Goals - 07/11/14 1614    OT SHORT TERM GOAL #1   Title Pt will be educated on HEP.    Time 4   Period Weeks   OT SHORT TERM GOAL #2   Title Pt will decrease fascial restrictions in LUE & cervical region from max to mod amount to decrease pain.    Time 4   Period Weeks   OT SHORT TERM GOAL #3   Title Pt will increase AROM of LUE to [facility] to increase ability to complete daily  and leisure tasks without pain.    Time 4   Period Weeks   OT SHORT TERM GOAL #4   Title Pt will decrease pain to 4/10 or less when completing daily activities.    Time 4   Period Weeks   Status On-going   OT SHORT TERM GOAL #5   Title Pt will increase LUE grip strength by 10# and pinch strength by 4# to increase ability to complete work tasks.    Time 4   Period Weeks   Status Partially Met           OT Long Term Goals - 07/11/14 1614    OT LONG TERM GOAL #1   Title Pt will return to prior level of functioning and independence in all B/IADL tasks.    Time 8   Period Weeks   OT LONG TERM GOAL #2   Title Pt will decrease pain to 2/10 or less during all daily activities.    Time 8   Period Weeks   Status On-going   OT LONG TERM GOAL #3   Title Pt will increase LUE grip strength by 15# and pinch strength by 7# to increase ability to hold onto dental instruments throughout the day.     Time 8   Period Weeks   Status On-going   OT LONG TERM GOAL #4   Title Pt will decrease fascial restrictions in LUE & cervical region from mod to min amounts or less.    Time 8   Period Weeks   Status On-going   OT LONG TERM GOAL #5   Title Pt will increase cervical AROM to WNL to increase ability to look over shoulder when driving without pain.    Time 8   Period Weeks   Status Partially Met               Plan - 07/18/14 1605    Clinical Impression Statement A: Pt able to grasp 8/13 beads with gripper at 61# versus previous 4/13. Pt reports numbness/tingling & pain that "comes and goes" in LUE, added ulnar nerve glides however pt reports increased tingling sensation, therefore did not provide HEP.    Plan P: Follow up on tingling/pain. Attempt tendon glides. Complete AROM in standing.         Problem List Patient Active Problem List   Diagnosis Date Noted  . Carpal tunnel syndrome 05/02/2014  . Cervical disc disorder with radiculopathy of cervical region 05/02/2014    Troy Smith, OTR/L  [phone]  07/18/2014, 4:09 PM  [place] [address] [place], [place], [address] Phone: [phone]   Fax:  [phone]

## 2014-07-20 ENCOUNTER — Encounter (HOSPITAL_COMMUNITY): Payer: 59 | Admitting: Occupational Therapy

## 2014-07-20 ENCOUNTER — Ambulatory Visit (HOSPITAL_COMMUNITY): Payer: 59 | Admitting: Occupational Therapy

## 2014-07-20 ENCOUNTER — Encounter (HOSPITAL_COMMUNITY): Payer: Self-pay | Admitting: Occupational Therapy

## 2014-07-20 DIAGNOSIS — M542 Cervicalgia: Secondary | ICD-10-CM

## 2014-07-20 DIAGNOSIS — M6281 Muscle weakness (generalized): Secondary | ICD-10-CM

## 2014-07-20 DIAGNOSIS — M25512 Pain in left shoulder: Secondary | ICD-10-CM

## 2014-07-20 NOTE — Therapy (Addendum)
[place], [place], [address] Phone: [phone]   Fax:  [phone]  Occupational Therapy Treatment  Patient Details  Name: Troy Smith MRN: [id number] Date of Birth: 17-Feb-1955 Referring Provider:  Drema Dallas,*  Encounter Date: 07/20/2014      OT End of Session - 07/20/14 1611    Visit Number 9   Number of Visits 16   Date for OT Re-Evaluation 08/29/14  Mini reassessment: 08/01/14   Authorization Type UHC   Activity Tolerance Patient tolerated treatment well   Behavior During Therapy [facility] for tasks assessed/performed      Past Medical History  Diagnosis Date  . Carpal tunnel syndrome 05/02/2014    Bilateral  . 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  There were no vitals filed for this visit.  Visit Diagnosis:  Left shoulder pain  Cervical pain (neck)  Muscle weakness of left upper extremity      Subjective Assessment - 07/20/14 1611    Subjective  S: Sometimes that muscles under my shoulder blade is really tight.    Currently in Pain? No/denies           OT Treatments/Exercises (OP) - 07/20/14 1536    Exercises   Exercises Shoulder;Neck;Hand   Neck Exercises: Stretches   Upper Trapezius Stretch 3 reps;10 seconds   Shoulder Exercises: Standing   Protraction AROM;15 reps   Horizontal ABduction AROM;15 reps   Flexion AROM;15 reps   ABduction AROM;15 reps   Extension Theraband;15 reps   Theraband Level (Shoulder Extension) Level 2 (Red)   Row Theraband;15 reps   Theraband Level (Shoulder Row) Level 2 (Red)   Retraction Theraband;15 reps   Theraband Level (Shoulder Retraction) Level 3 (Green)   Shoulder Exercises: ROM/Strengthening   UBE (Upper Arm Bike) Level 1 3' forward 3' reverse   "W" Arms 15X   X to V Arms 15X   Prot/Ret//Elev/Dep 1'   Hand Exercises   Theraputty - Roll green   Theraputty - Grip green-pronated & supinated   Hand Gripper with Medium Beads 13/13 beads 61#   2 extended rest breaks   Hand Gripper with Small Beads 17/17 55#   Other Hand Exercises Pt used green high resistance clothespin to pick up high resistance sponges. Pt was asked to open the clothespin all the way to cover the entire sponge. 25X            OT Short Term Goals - 07/11/14 1614    OT SHORT TERM GOAL #1   Title Pt will be educated on HEP.    Time 4   Period Weeks   OT SHORT TERM GOAL #2   Title Pt will decrease fascial restrictions in LUE & cervical region from max to mod amount to decrease pain.    Time 4   Period Weeks   OT SHORT TERM GOAL #3   Title Pt will increase AROM of LUE to [facility] to increase ability to complete daily and leisure tasks without pain.    Time 4   Period Weeks   OT SHORT TERM GOAL #4   Title Pt will decrease pain to 4/10 or less when completing daily activities.    Time 4   Period Weeks   Status On-going   OT SHORT TERM GOAL #5   Title Pt will increase LUE grip strength by 10# and pinch  strength by 4# to increase ability to complete work tasks.    Time 4   Period Weeks   Status Partially Met           OT Long Term Goals - 07/11/14 1614    OT LONG TERM GOAL #1   Title Pt will return to prior level of functioning and independence in all B/IADL tasks.    Time 8   Period Weeks   OT LONG TERM GOAL #2   Title Pt will decrease pain to 2/10 or less during all daily activities.    Time 8   Period Weeks   Status On-going   OT LONG TERM GOAL #3   Title Pt will increase LUE grip strength by 15# and pinch strength by 7# to increase ability to hold onto dental instruments throughout the day.    Time 8   Period Weeks   Status On-going   OT LONG TERM GOAL #4   Title Pt will decrease fascial restrictions in LUE & cervical region from mod to min amounts or less.    Time 8   Period Weeks   Status On-going   OT LONG TERM GOAL #5   Title Pt will  increase cervical AROM to WNL to increase ability to look over shoulder when driving without pain.    Time 8   Period Weeks   Status Partially Met               Plan - 07/20/14 1611    Clinical Impression Statement A: Pt able to complete hand gripper at 61# with medium beads with multiple rest breaks. Added tendon gildes, resumed AROM exercises in standing. Pt continues to report tingling comes and goes in bilateral hands. Provided pt with and educated pt on tendon gliding HEP.    Plan P: Increase UBE to level 2. Resume MFR.  Add 1# weight in supine. Follow-up on tendon glide HEP.         Problem List Patient Active Problem List   Diagnosis Date Noted  . Carpal tunnel syndrome 05/02/2014  . Cervical disc disorder with radiculopathy of cervical region 05/02/2014    Guadelupe Sabin, OTR/L  [phone]  07/20/2014, 4:18 PM  [place], [place], [address] Phone: [phone]   Fax:  [phone]

## 2014-07-25 ENCOUNTER — Ambulatory Visit (HOSPITAL_COMMUNITY): Payer: 59 | Admitting: Occupational Therapy

## 2014-07-27 ENCOUNTER — Ambulatory Visit (HOSPITAL_COMMUNITY): Payer: 59 | Admitting: Occupational Therapy

## 2014-07-27 DIAGNOSIS — M25512 Pain in left shoulder: Secondary | ICD-10-CM | POA: Diagnosis not present

## 2014-07-27 DIAGNOSIS — M6281 Muscle weakness (generalized): Secondary | ICD-10-CM

## 2014-07-27 DIAGNOSIS — M542 Cervicalgia: Secondary | ICD-10-CM

## 2014-07-27 NOTE — Therapy (Signed)
[place], [place], [address] Phone: [phone]   Fax:  [phone]  Occupational Therapy Treatment  Patient Details  Name: Troy Smith MRN: [id number] Date of Birth: 1955-07-24 Referring Provider:  Drema Dallas,*  Encounter Date: 07/27/2014      OT End of Session - 07/27/14 1615    Visit Number 10   Number of Visits 16   Date for OT Re-Evaluation 08/29/14  Mini reassessment: 08/01/14   Authorization Type UHC   OT Start Time 1540   OT Stop Time 1618   OT Time Calculation (min) 38 min   Activity Tolerance Patient tolerated treatment well   Behavior During Therapy [facility] for tasks assessed/performed      Past Medical History  Diagnosis Date  . Carpal tunnel syndrome 05/02/2014    Bilateral  . 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  There were no vitals filed for this visit.  Visit Diagnosis:  Left shoulder pain  Cervical pain (neck)  Muscle weakness of left upper extremity      Subjective Assessment - 07/27/14 1544    Subjective  S: It hasn't been bad this week at all.    Currently in Pain? Yes   Pain Score 2    Pain Location Shoulder   Pain Orientation Left   Pain Descriptors / Indicators Aching   Pain Type Chronic pain           OT Treatments/Exercises (OP) - 07/27/14 1545    Exercises   Exercises Shoulder;Neck;Hand   Shoulder Exercises: Supine   Protraction PROM;5 reps;Strengthening;Weights;15 reps   Protraction Weight (lbs) 1   Horizontal ABduction PROM;5 reps;Strengthening;Weights;15 reps   Horizontal ABduction Weight (lbs) 1   External Rotation PROM;5 reps;Strengthening;Weights;15 reps   External Rotation Weight (lbs) 1   Internal Rotation PROM;5 reps;Strengthening;Weights;15 reps   Internal Rotation Weight (lbs) 1   Flexion PROM;5 reps;Strengthening;Weights;15 reps   Shoulder  Flexion Weight (lbs) 1   ABduction PROM;5 reps;Strengthening;Weights;15 reps   Shoulder ABduction Weight (lbs) 1   Shoulder Exercises: Standing   Protraction Strengthening;Weights;12 reps   Protraction Weight (lbs) 1   Horizontal ABduction Strengthening;Weights;12 reps   Horizontal ABduction Weight (lbs) 1   External Rotation Strengthening;Weights;12 reps   External Rotation Weight (lbs) 1   Internal Rotation Strengthening;Weights;12 reps   Internal Rotation Weight (lbs) 1   Flexion Strengthening;Weights;12 reps   Shoulder Flexion Weight (lbs) 1   ABduction Strengthening;Weights;12 reps   Shoulder ABduction Weight (lbs) 1   Extension Theraband;15 reps   Theraband Level (Shoulder Extension) Level 3 (Green)   Row [employer] reps   Theraband Level (Shoulder Row) Level 3 (Green)   Retraction Theraband;15 reps   Theraband Level (Shoulder Retraction) Level 3 (Green)   Shoulder Exercises: ROM/Strengthening   UBE (Upper Arm Bike) Level 2 3' forward 3' reverse   X to V Arms 12X with 1# weight   Proximal Shoulder Strengthening, Supine 12X with 1# weight no rest break   Proximal Shoulder Strengthening, Seated 12X with 1# weight   Ball on Wall 1' flexion 1' abduction   Manual Therapy   Manual Therapy Myofascial release   Myofascial Release Myofascial release to left bicep, upper arm, trapezius,scapularis and bilateral cervical regions to decrease fascial restrictions and pain, and to increase joint mobility            OT Short Term  Goals - 07/11/14 1614    OT SHORT TERM GOAL #1   Title Pt will be educated on HEP.    Time 4   Period Weeks   OT SHORT TERM GOAL #2   Title Pt will decrease fascial restrictions in LUE & cervical region from max to mod amount to decrease pain.    Time 4   Period Weeks   OT SHORT TERM GOAL #3   Title Pt will increase AROM of LUE to [facility] to increase ability to complete daily and leisure tasks without pain.    Time 4   Period Weeks   OT SHORT TERM GOAL  #4   Title Pt will decrease pain to 4/10 or less when completing daily activities.    Time 4   Period Weeks   Status On-going   OT SHORT TERM GOAL #5   Title Pt will increase LUE grip strength by 10# and pinch strength by 4# to increase ability to complete work tasks.    Time 4   Period Weeks   Status Partially Met           OT Long Term Goals - 07/11/14 1614    OT LONG TERM GOAL #1   Title Pt will return to prior level of functioning and independence in all B/IADL tasks.    Time 8   Period Weeks   OT LONG TERM GOAL #2   Title Pt will decrease pain to 2/10 or less during all daily activities.    Time 8   Period Weeks   Status On-going   OT LONG TERM GOAL #3   Title Pt will increase LUE grip strength by 15# and pinch strength by 7# to increase ability to hold onto dental instruments throughout the day.    Time 8   Period Weeks   Status On-going   OT LONG TERM GOAL #4   Title Pt will decrease fascial restrictions in LUE & cervical region from mod to min amounts or less.    Time 8   Period Weeks   Status On-going   OT LONG TERM GOAL #5   Title Pt will increase cervical AROM to WNL to increase ability to look over shoulder when driving without pain.    Time 8   Period Weeks   Status Partially Met               Plan - 07/27/14 1616    Clinical Impression Statement A: Pt arrived late for session this date. Pt reports he has been feeling pretty good this week. Added 1# weight during shoulder exercises in supine and standing, added ball on wall, increased UBE to level 2. Pt tolerated exercises very well. Noted tightness along serratus anterior and along medial border of scapula. Pt report tendon glides are going well.    Plan P: Mini-reassess. Follow up on soreness after previous session with weights.         Problem List Patient Active Problem List   Diagnosis Date Noted  . Carpal tunnel syndrome 05/02/2014  . Cervical disc disorder with radiculopathy of  cervical region 05/02/2014    Guadelupe Sabin, OTR/L  [phone]  07/27/2014, 4:22 PM  [place] [address] [place], [place], [address] Phone: [phone]   Fax:  [phone]

## 2014-08-01 ENCOUNTER — Ambulatory Visit (HOSPITAL_COMMUNITY): Payer: 59 | Admitting: Specialist

## 2014-08-08 ENCOUNTER — Encounter (HOSPITAL_COMMUNITY): Payer: 59 | Admitting: Specialist

## 2014-08-10 ENCOUNTER — Encounter (HOSPITAL_COMMUNITY): Payer: Self-pay | Admitting: Occupational Therapy

## 2014-08-10 ENCOUNTER — Ambulatory Visit (HOSPITAL_COMMUNITY): Payer: 59 | Attending: Physical Medicine and Rehabilitation | Admitting: Occupational Therapy

## 2014-08-10 DIAGNOSIS — M542 Cervicalgia: Secondary | ICD-10-CM | POA: Diagnosis present

## 2014-08-10 DIAGNOSIS — M6281 Muscle weakness (generalized): Secondary | ICD-10-CM | POA: Diagnosis present

## 2014-08-10 DIAGNOSIS — M25512 Pain in left shoulder: Secondary | ICD-10-CM | POA: Insufficient documentation

## 2014-08-10 NOTE — Therapy (Signed)
[place], [place], [address] Phone: [phone]   Fax:  [phone]  Occupational Therapy Treatment  Patient Details  Name: Troy Smith MRN: [id number] Date of Birth: 13-Aug-1955 Referring Provider:  Drema Dallas,*  Encounter Date: 08/10/2014      OT End of Session - 08/10/14 1604    Visit Number 11   Number of Visits 16   Date for OT Re-Evaluation 08/29/14  Mini reassessment: 08/01/14   Authorization Type [employer]   OT Start Time 1522   OT Stop Time 1600   OT Time Calculation (min) 38 min   Activity Tolerance Patient tolerated treatment well   Behavior During Therapy [facility] for tasks assessed/performed      Past Medical History  Diagnosis Date  . Carpal tunnel syndrome 05/02/2014    Bilateral  . 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  There were no vitals filed for this visit.  Visit Diagnosis:  Left shoulder pain  Cervical pain (neck)  Muscle weakness of left upper extremity      Subjective Assessment - 08/10/14 1528    Subjective  S: I have a little tingling in my fingers at the end of the workday but otherwise no complaints.    Special Tests FOTO Score: 88/100 (12% impairment)   Currently in Pain? No/denies            [facility] OT Assessment - 08/10/14 1528    Assessment   Diagnosis Stenosis & left shoulder pain   Precautions   Precautions None   Coordination   9 Hole Peg Test Left   Left 9 Hole Peg Test 22"  24" previous   Palpation   Palpation comment Min fascial restrictions in LUE   AROM   Overall AROM Comments Assessed in standing, IR/ER adducted    AROM Assessment Site Shoulder   Right/Left Shoulder Left   Left Shoulder Flexion 160 Degrees  174 previous   Left Shoulder ABduction 167 Degrees  171 previous   Left Shoulder Internal Rotation 90 Degrees  same as previous   Left Shoulder  External Rotation 77 Degrees  90 previous   Cervical Flexion 90 degrees  same as previous   Cervical Extension 75 degrees  70 previous   Cervical - Right Side Bend 45 degrees  32 previous   Cervical - Left Side Bend 33 degrees  40 previous   Cervical - Right Rotation 79 degrees  55 previous   Cervical - Left Rotation 61 degrees  72 previous   Strength   Left Shoulder Flexion 5/5  same as previous   Left Shoulder ABduction 5/5  same as previous   Left Shoulder Internal Rotation 4+/5  same as previous   Left Shoulder External Rotation 5/5  same as previous   Left Hand Grip (lbs) 68  65 previous   Left Hand Lateral Pinch 12 lbs  11 previous   Left Hand 3 Point Pinch 16 lbs  13 previous                  OT Treatments/Exercises (OP) - 08/10/14 1609    Shoulder Exercises: Standing   Horizontal ABduction Theraband;5 reps   Theraband Level (Shoulder Horizontal ABduction) Level 3 (Green)   External Rotation Theraband;5 reps   Theraband Level (Shoulder External Rotation) Level 3 (Green)   Internal Rotation Theraband;5 reps   Theraband  Level (Shoulder Internal Rotation) Level 3 (Green)   Other Standing Exercises Green theraband-PNF patterns 5X each                OT Education - 08/10/14 1549    Education provided Yes   Education Details green theraband   Person(s) Educated Patient   Methods Explanation;Demonstration;Handout   Comprehension Verbalized understanding;Returned demonstration          OT Short Term Goals - 08/10/14 1541    OT SHORT TERM GOAL #1   Title Pt will be educated on HEP.    Time 4   Period Weeks   OT SHORT TERM GOAL #2   Title Pt will decrease fascial restrictions in LUE & cervical region from max to mod amount to decrease pain.    Time 4   Period Weeks   OT SHORT TERM GOAL #3   Title Pt will increase AROM of LUE to [facility] to increase ability to complete daily and leisure tasks without pain.    Time 4   Period Weeks   OT  SHORT TERM GOAL #4   Title Pt will decrease pain to 4/10 or less when completing daily activities.    Time 4   Period Weeks   Status Achieved   OT SHORT TERM GOAL #5   Title Pt will increase LUE grip strength by 10# and pinch strength by 4# to increase ability to complete work tasks.    Time 4   Period Weeks   Status Partially Met           OT Long Term Goals - 08/10/14 1542    OT LONG TERM GOAL #1   Title Pt will return to prior level of functioning and independence in all B/IADL tasks.    Time 8   Period Weeks   OT LONG TERM GOAL #2   Title Pt will decrease pain to 2/10 or less during all daily activities.    Time 8   Period Weeks   Status Not Met   OT LONG TERM GOAL #3   Title Pt will increase LUE grip strength by 15# and pinch strength by 7# to increase ability to hold onto dental instruments throughout the day.    Time 8   Period Weeks   Status Partially Met   OT LONG TERM GOAL #4   Title Pt will decrease fascial restrictions in LUE & cervical region from mod to min amounts or less.    Time 8   Period Weeks   Status Achieved   OT LONG TERM GOAL #5   Title Pt will increase cervical AROM to WNL to increase ability to look over shoulder when driving without pain.    Time 8   Period Weeks   Status Achieved               Plan - 08/10/14 1605    Clinical Impression Statement A: Mini-reassessment completed this session. Pt has met 4/5 STGs, 3/5 LTGs, and has partially met 1/5 STGs and 1/5 LTGs. Pt reports he is doing all daily and work tasks as he normally would, he does have mimial tingling after completing long procedures at work or at the end of the workday. Provided pt with and educated pt on green theraband exercises to continue at home on discharge. Pt is agreeable to discharge.    Plan P: Discharge pt.         Problem List Patient Active Problem List   Diagnosis Date  Noted  . Carpal tunnel syndrome 05/02/2014  . Cervical disc disorder with  radiculopathy of cervical region 05/02/2014    Guadelupe Sabin, OTR/L  [phone]  08/10/2014, 4:10 PM  [place], [place], [address] Phone: [phone]   Fax:  [phone]   OCCUPATIONAL THERAPY DISCHARGE SUMMARY  Visits from Start of Care: 1  Current functional level related to goals / functional outcomes: See above.    Remaining deficits: Pt reports he does have minimal tingling at the end of the work day or at the end of a long surgical procedure.    Education / Equipment: Provided pt with green theraband and HEP.  Plan: Patient agrees to discharge.  Patient goals were met. Patient is being discharged due to meeting the stated rehab goals.  ?????

## 2014-08-10 NOTE — Patient Instructions (Signed)
Strengthening: Chest Pull - Resisted   Hold Theraband in front of body with hands about shoulder width a part. Pull band a part and back together slowly. Repeat __15__ times. Complete _1___ set(s) per session.. Repeat _1-2___ session(s) per day.  http://orth.exer.us/926   Copyright  VHI. All rights reserved.   PNF Strengthening: Resisted   Standing with resistive band around each hand, bring right arm up and away, thumb back. Repeat _15___ times per set. Do __1__ sets per session. Do _1-2___ sessions per day.  http://orth.exer.us/918   Copyright  VHI. All rights reserved.   PNF Strengthening: Resisted   Standing with resistive band around each hand, bring right arm up and across body. Repeat __15__ times per set. Do ___1_ sets per session. Do __1-2__ sessions per day.  http://orth.exer.us/920   Copyright  VHI. All rights reserved.    Resisted External Rotation: in Neutral - Bilateral   Sit or stand, tubing in both hands, elbows at sides, bent to 90, forearms forward. Pinch shoulder blades together and rotate forearms out. Keep elbows at sides. Repeat __15__ times per set. Do __1__ sets per session. Do _1-2___ sessions per day.  http://orth.exer.us/966   Copyright  VHI. All rights reserved.   PNF Strengthening: Resisted   Standing, hold resistive band above head. Bring right arm down and out from side. Repeat _15___ times per set. Do __1__ sets per session. Do _1-2___ sessions per day.  http://orth.exer.us/922   Copyright  VHI. All rights reserved.

## 2014-08-15 ENCOUNTER — Encounter (HOSPITAL_COMMUNITY): Payer: 59

## 2014-09-14 ENCOUNTER — Other Ambulatory Visit: Payer: Self-pay | Admitting: Allergy

## 2014-09-14 ENCOUNTER — Ambulatory Visit
Admission: RE | Admit: 2014-09-14 | Discharge: 2014-09-14 | Disposition: A | Discharge status: Home / Self Care | Payer: 59 | Source: Ambulatory Visit | Attending: Allergy | Admitting: Allergy

## 2014-09-14 DIAGNOSIS — R059 Cough, unspecified: Secondary | ICD-10-CM

## 2014-09-14 DIAGNOSIS — R05 Cough: Secondary | ICD-10-CM

## 2015-11-11 ENCOUNTER — Encounter (INDEPENDENT_AMBULATORY_CARE_PROVIDER_SITE_OTHER): Payer: Self-pay | Admitting: Internal Medicine

## 2015-11-20 ENCOUNTER — Ambulatory Visit (INDEPENDENT_AMBULATORY_CARE_PROVIDER_SITE_OTHER): Payer: 59 | Admitting: Internal Medicine

## 2015-11-20 ENCOUNTER — Other Ambulatory Visit (INDEPENDENT_AMBULATORY_CARE_PROVIDER_SITE_OTHER): Payer: Self-pay | Admitting: Internal Medicine

## 2015-11-20 ENCOUNTER — Telehealth (INDEPENDENT_AMBULATORY_CARE_PROVIDER_SITE_OTHER): Payer: Self-pay | Admitting: *Deleted

## 2015-11-20 ENCOUNTER — Ambulatory Visit (HOSPITAL_COMMUNITY)
Admission: RE | Admit: 2015-11-20 | Discharge: 2015-11-20 | Disposition: A | Discharge status: Home / Self Care | Payer: 59 | Source: Ambulatory Visit | Attending: Internal Medicine | Admitting: Internal Medicine

## 2015-11-20 ENCOUNTER — Telehealth (INDEPENDENT_AMBULATORY_CARE_PROVIDER_SITE_OTHER): Payer: Self-pay | Admitting: Internal Medicine

## 2015-11-20 ENCOUNTER — Encounter (INDEPENDENT_AMBULATORY_CARE_PROVIDER_SITE_OTHER): Payer: Self-pay | Admitting: *Deleted

## 2015-11-20 ENCOUNTER — Encounter (INDEPENDENT_AMBULATORY_CARE_PROVIDER_SITE_OTHER): Payer: Self-pay | Admitting: Internal Medicine

## 2015-11-20 VITALS — BP 120/80 | HR 72 | Temp 98.4°F | Ht 71.0 in | Wt 236.6 lb

## 2015-11-20 DIAGNOSIS — R634 Abnormal weight loss: Secondary | ICD-10-CM

## 2015-11-20 DIAGNOSIS — N200 Calculus of kidney: Secondary | ICD-10-CM | POA: Diagnosis not present

## 2015-11-20 DIAGNOSIS — K5732 Diverticulitis of large intestine without perforation or abscess without bleeding: Secondary | ICD-10-CM

## 2015-11-20 DIAGNOSIS — N400 Enlarged prostate without lower urinary tract symptoms: Secondary | ICD-10-CM | POA: Insufficient documentation

## 2015-11-20 DIAGNOSIS — R195 Other fecal abnormalities: Secondary | ICD-10-CM | POA: Insufficient documentation

## 2015-11-20 DIAGNOSIS — M5136 Other intervertebral disc degeneration, lumbar region: Secondary | ICD-10-CM | POA: Diagnosis not present

## 2015-11-20 DIAGNOSIS — K625 Hemorrhage of anus and rectum: Secondary | ICD-10-CM

## 2015-11-20 DIAGNOSIS — I10 Essential (primary) hypertension: Secondary | ICD-10-CM | POA: Insufficient documentation

## 2015-11-20 LAB — COMPREHENSIVE METABOLIC PANEL
ALBUMIN: 3.8 g/dL (ref 3.6–5.1)
ALK PHOS: 58 U/L (ref 40–115)
ALT: 10 U/L (ref 9–46)
AST: 11 U/L (ref 10–35)
BILIRUBIN TOTAL: 0.9 mg/dL (ref 0.2–1.2)
BUN: 18 mg/dL (ref 7–25)
CALCIUM: 9.6 mg/dL (ref 8.6–10.3)
CO2: 29 mmol/L (ref 20–31)
Chloride: 100 mmol/L (ref 98–110)
Creat: 1.24 mg/dL (ref 0.70–1.33)
Glucose, Bld: 139 mg/dL — ABNORMAL HIGH (ref 65–99)
Potassium: 3.7 mmol/L (ref 3.5–5.3)
Sodium: 140 mmol/L (ref 135–146)
Total Protein: 6.3 g/dL (ref 6.1–8.1)

## 2015-11-20 LAB — CBC WITH DIFFERENTIAL/PLATELET
BASOS PCT: 0 %
Basophils Absolute: 0 cells/uL (ref 0–200)
EOS ABS: 158 {cells}/uL (ref 15–500)
Eosinophils Relative: 2 %
HEMATOCRIT: 34.7 % — AB (ref 38.5–50.0)
HEMOGLOBIN: 11.7 g/dL — AB (ref 13.2–17.1)
LYMPHS ABS: 1817 {cells}/uL (ref 850–3900)
Lymphocytes Relative: 23 %
MCH: 27 pg (ref 27.0–33.0)
MCHC: 33.7 g/dL (ref 32.0–36.0)
MCV: 80 fL (ref 80.0–100.0)
MONO ABS: 711 {cells}/uL (ref 200–950)
MPV: 10 fL (ref 7.5–12.5)
Monocytes Relative: 9 %
NEUTROS PCT: 66 %
Neutro Abs: 5214 cells/uL (ref 1500–7800)
Platelets: 232 10*3/uL (ref 140–400)
RBC: 4.34 MIL/uL (ref 4.20–5.80)
RDW: 14.1 % (ref 11.0–15.0)
WBC: 7.9 10*3/uL (ref 3.8–10.8)

## 2015-11-20 LAB — POCT I-STAT CREATININE: CREATININE: 1.2 mg/dL (ref 0.61–1.24)

## 2015-11-20 MED ORDER — PEG 3350-KCL-NA BICARB-NACL 420 G PO SOLR: 4000.0000 mL | Freq: Once | ORAL | 0 refills | Status: AC

## 2015-11-20 MED ORDER — IOPAMIDOL (ISOVUE-300) INJECTION 61%
100.0000 mL | Freq: Once | INTRAVENOUS | Status: AC | PRN
  Administered 2015-11-20: 100 mL via INTRAVENOUS

## 2015-11-20 MED ORDER — CIPROFLOXACIN HCL 500 MG PO TABS: 500.0000 mg | ORAL_TABLET | Freq: Two times a day (BID) | ORAL | 0 refills | Status: DC

## 2015-11-20 MED ORDER — IOPAMIDOL (ISOVUE-300) INJECTION 61%
INTRAVENOUS | Status: AC
Start: 2015-11-20 — End: 2015-11-20
  Administered 2015-11-20: 30 mL
  Filled 2015-11-20: qty 30

## 2015-11-20 MED ORDER — METRONIDAZOLE 500 MG PO TABS: 500.0000 mg | ORAL_TABLET | Freq: Two times a day (BID) | ORAL | 0 refills | Status: DC

## 2015-11-20 NOTE — Patient Instructions (Addendum)
CT abdomen/pelvis. CBC and CMET. Colonoscopy.

## 2015-11-20 NOTE — Telephone Encounter (Signed)
Patient needs trilyte 

## 2015-11-20 NOTE — Progress Notes (Addendum)
   Subjective:    Patient ID: Troy Smith, male    DOB: October 07, 1955, 60 y.o.   MRN: [id number]  HPI Referred by Dr. Hilma Favors for abdominal pain/diarrhea. He tells me he had been having diarrhea, x 4 months. States he would have up to 10 stools a day.   Saw  Dr Hilma Favors and was treated empirically with Flagyl and Septra x 10 days. He has lower abdominal pain.     He says he got better but symptoms returned after finishing antibiotics. . He has been treated x 3. The last round he was treated with Cipro. Finished Cipro 4-5 days ago.  He says he is 95% better.  Stools are formed. Recent stools studies were negative. He denies seeing any blood. States he stools have been orange in color.  He says he had a lot of flatus.  He is having 3-4 stools a day now which is normal.  Appetite is has not been good over the past 4 months because of the diarrhea. 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  He 07/04/2015 H and H 12. and 34.8. MCV 77. Platelet ct 230., Vitamin B12 373, Folate 13.8, Ferritin 77, TIBC 352. UIBC 285, Iron 57.  % sat 17.   No family hx of colon cancer.   Colonoscopy 2008 Dr. Arnoldo Morale.:  RECTUM, POLYPECTOMY: FRAGMENTS OF TUBULOVILLOUS ADENOMA. NO HIGH GRADE DYSPLASIA IS IDENTIFIED. SEE COMMENT.  Review of Systems Past Medical History:  Diagnosis Date  . Carpal tunnel syndrome 05/02/2014   Bilateral  . Cervical disc disorder with radiculopathy of cervical region 05/02/2014   Left C8  . Hypertension    Current Outpatient Prescriptions  Medication Sig Dispense Refill  . amLODipine (NORVASC) 5 MG tablet Take 5 mg by mouth daily.    . hydrochlorothiazide (HYDRODIURIL) 12.5 MG tablet Take 12.5 mg by mouth daily.    . insulin glargine (LANTUS) 100 UNIT/ML injection Inject into the skin at bedtime. Once a day, 50 units before hs    . metoprolol succinate (TOPROL-XL) 25 MG 24 hr tablet Take 25 mg by mouth  daily.     No current facility-administered medications for this visit.     No past surgical history on file.  No Known Allergies  No current outpatient prescriptions on file prior to visit.   No current facility-administered medications on file prior to visit.        Objective:   Physical Exam Blood pressure 120/80, pulse 72, temperature 98.4 F (36.9 C), height 5\' 11"  (1.803 m), weight 236 lb 9.6 oz (107.3 kg).  Alert and oriented. Skin warm and dry. Oral mucosa is moist.   . Sclera anicteric, conjunctivae is pink. Thyroid not enlarged. No cervical lymphadenopathy. Lungs clear. Heart regular rate and rhythm.  Abdomen is soft. Bowel sounds are positive. No hepatomegaly. No abdominal masses felt. No tenderness.  No edema to lower extremities. Stool brown, obvious blood. Guaiac positive.       Assessment & Plan:  Guaiac positive stool. Change in stool. Diarrhea. Weight loss. Lower abdominal pain. Colonic neoplasm needs to be ruled out.  I discussed with Dr. Oneida Alar. CT abdomen/pelvis with CM. CMET, CBC.  Will needs a colonoscopy next week. Order placed I discussed this with Dr. Oneida Alar.

## 2015-11-21 NOTE — Telephone Encounter (Signed)
error 

## 2015-11-22 ENCOUNTER — Encounter (INDEPENDENT_AMBULATORY_CARE_PROVIDER_SITE_OTHER): Payer: Self-pay

## 2015-11-26 ENCOUNTER — Encounter (HOSPITAL_COMMUNITY): Payer: Self-pay

## 2015-11-26 ENCOUNTER — Ambulatory Visit (HOSPITAL_COMMUNITY): Admit: 2015-11-26 | Payer: 59 | Admitting: Internal Medicine

## 2015-11-26 SURGERY — COLONOSCOPY
Anesthesia: Moderate Sedation

## 2016-02-25 ENCOUNTER — Encounter (INDEPENDENT_AMBULATORY_CARE_PROVIDER_SITE_OTHER): Payer: Self-pay | Admitting: *Deleted

## 2016-02-25 ENCOUNTER — Encounter (INDEPENDENT_AMBULATORY_CARE_PROVIDER_SITE_OTHER): Payer: Self-pay | Admitting: Internal Medicine

## 2016-02-25 ENCOUNTER — Ambulatory Visit (INDEPENDENT_AMBULATORY_CARE_PROVIDER_SITE_OTHER): Payer: 59 | Admitting: Internal Medicine

## 2016-02-25 ENCOUNTER — Telehealth (INDEPENDENT_AMBULATORY_CARE_PROVIDER_SITE_OTHER): Payer: Self-pay | Admitting: *Deleted

## 2016-02-25 ENCOUNTER — Encounter (INDEPENDENT_AMBULATORY_CARE_PROVIDER_SITE_OTHER): Payer: Self-pay

## 2016-02-25 VITALS — BP 118/80 | HR 72 | Temp 98.2°F | Resp 18 | Ht 71.0 in | Wt 224.1 lb

## 2016-02-25 DIAGNOSIS — R634 Abnormal weight loss: Secondary | ICD-10-CM

## 2016-02-25 DIAGNOSIS — K5732 Diverticulitis of large intestine without perforation or abscess without bleeding: Secondary | ICD-10-CM

## 2016-02-25 MED ORDER — PEG 3350-KCL-NA BICARB-NACL 420 G PO SOLR: 4000.0000 mL | Freq: Once | ORAL | 0 refills | Status: AC

## 2016-02-25 MED ORDER — CIPROFLOXACIN HCL 500 MG PO TABS: 500.0000 mg | ORAL_TABLET | Freq: Two times a day (BID) | ORAL | 0 refills | Status: DC

## 2016-02-25 MED ORDER — METRONIDAZOLE 500 MG PO TABS: 500.0000 mg | ORAL_TABLET | Freq: Two times a day (BID) | ORAL | 0 refills | Status: DC

## 2016-02-25 NOTE — Patient Instructions (Addendum)
Can stop antibiotics after 1 week. Call if pain recurs. Colonoscopy to be scheduled in 3 weeks. Weight check in 8 weeks.

## 2016-02-25 NOTE — Telephone Encounter (Signed)
Patient needs trilyte 

## 2016-02-25 NOTE — Progress Notes (Signed)
Presenting complaint;  Abdominal pain and weight loss.  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 His present illness began in August 2017 when he developed lower abdominal pain sigmoid diverticulitis and he was treated with Cipro and Flagyl by Dr. Hilma Favors with resolution of his symptoms.   he was referred to our office in October 2017 when he was complaining of recurrent lower abdominal pain and he also reported intermittent diarrhea. CBC and comprehensive chemistry panel were unremarkable. Abdominopelvic CT was obtained and it showed changes of sigmoid diverticulitis. He was retreated with antibiotics and his symptoms once again resolved. Was supposed to have colonoscopy but this has not been scheduled. Over the weekend he noted lower abdominal pain and started septra which she had left over from last prescription. Today he feels better. He has soreness. He has lost portal of 25 pounds since August last year. He feels weight loss is because he has changes eating habits in order to prevent his symptoms. He has 4-5 stools per day. His stools are not firm. Prior to August 2017 he had an average of 2 formed stools daily. He did experience rectal bleeding back in August 2017 but none since then.   His last colonoscopy was in 2008 by Dr. Arnoldo Morale. He was found to have small tubular adenoma.has not had a follow-up colonoscopy.  Family history is negative for CRC.   Current Medications: Outpatient Encounter Prescriptions as of 02/25/2016  Medication Sig  . amLODipine (NORVASC) 5 MG tablet Take 5 mg by mouth daily.  . hydrochlorothiazide (HYDRODIURIL) 12.5 MG tablet Take 12.5 mg by mouth daily.  . insulin glargine (LANTUS) 100 UNIT/ML injection Inject 30 Units into the skin at bedtime. Once a day, 50 units before hs   . metoprolol succinate (TOPROL-XL) 25 MG 24 hr tablet Take 25 mg by mouth daily.  .  Sulfamethoxazole-Trimethoprim (SEPTRA DS PO) Take by mouth daily. Patient started back taking this medication Monday ,15th of January after flare with Diverticulitis.  . [DISCONTINUED] ciprofloxacin (CIPRO) 500 MG tablet Take 1 tablet (500 mg total) by mouth 2 (two) times daily. (Patient not taking: Reported on 02/25/2016)  . [DISCONTINUED] metroNIDAZOLE (FLAGYL) 500 MG tablet Take 1 tablet (500 mg total) by mouth 2 (two) times daily. (Patient not taking: Reported on 02/25/2016)   No facility-administered encounter medications on file as of 02/25/2016.      Objective: Blood pressure 118/80, pulse 72, temperature 98.2 F (36.8 C), temperature source Oral, resp. rate 18, height 5\' 11"  (1.803 m), weight 224 lb 1.6 oz (101.7 kg).  Patient is alert and in no acute distress.  Conjunctiva is pink. Sclera is nonicteric Oropharyngeal mucosa is normal. No neck masses or thyromegaly noted. Cardiac exam with regular rhythm normal S1 and S2. No murmur or gallop noted. Lungs are clear to auscultation. Abdomen symmetrical. Bowel sounds are normal. On palpation abdomen is soft with mild discomfort in hypogastric region on deep palpation. No organomegaly or masses.  No LE edema or clubbing noted.  Labs/studies Results:   CT images from 11/19/2025 reviewed along with patient.   Assessment:  #1. Recurrent sigmoid colon diverticulitis. This is the third episode and appears to be the mildest of the 3. He needs to be retreated with antibiotic and undergo colonoscopy as soon as possible in order to make sure he does not have colonic neoplasm mimicking diverticulitis.  #2. weight loss secondary to limited food intake.  #3. History of  colonic adenoma discovered 10 years ago. Colonoscopy to be performed in near future.    Plan:   Cipro 500 mg by mouth twice a day. Patient given prescription for 2 weeks.   Metronidazole 500 mg by mouth twice a day. prescription given for 2 weeks.  Patient will stop antibiotics  in one week unless he still has some dscomfort. Colonoscopy in 3 weeks from now.

## 2016-02-28 ENCOUNTER — Other Ambulatory Visit (INDEPENDENT_AMBULATORY_CARE_PROVIDER_SITE_OTHER): Payer: Self-pay | Admitting: *Deleted

## 2016-02-28 DIAGNOSIS — R634 Abnormal weight loss: Secondary | ICD-10-CM

## 2016-02-28 DIAGNOSIS — K5732 Diverticulitis of large intestine without perforation or abscess without bleeding: Secondary | ICD-10-CM

## 2016-02-28 DIAGNOSIS — K921 Melena: Secondary | ICD-10-CM

## 2016-03-20 ENCOUNTER — Encounter (HOSPITAL_COMMUNITY): Payer: Self-pay | Admitting: *Deleted

## 2016-03-20 ENCOUNTER — Other Ambulatory Visit (INDEPENDENT_AMBULATORY_CARE_PROVIDER_SITE_OTHER): Payer: Self-pay | Admitting: Internal Medicine

## 2016-03-20 ENCOUNTER — Other Ambulatory Visit: Payer: Self-pay

## 2016-03-20 ENCOUNTER — Ambulatory Visit (HOSPITAL_COMMUNITY)
Admission: RE | Admit: 2016-03-20 | Discharge: 2016-03-20 | Disposition: A | Discharge status: Home / Self Care | Payer: 59 | Source: Ambulatory Visit | Attending: Internal Medicine | Admitting: Internal Medicine

## 2016-03-20 ENCOUNTER — Encounter (HOSPITAL_COMMUNITY)
Admit: 2016-03-20 | Discharge: 2016-03-20 | Disposition: A | Discharge status: Home / Self Care | Payer: 59 | Attending: General Surgery | Admitting: General Surgery

## 2016-03-20 ENCOUNTER — Encounter (HOSPITAL_COMMUNITY)
Admission: RE | Disposition: A | Discharge status: Home / Self Care | Payer: Self-pay | Source: Ambulatory Visit | Attending: Internal Medicine

## 2016-03-20 ENCOUNTER — Ambulatory Visit (HOSPITAL_COMMUNITY)
Admission: RE | Admit: 2016-03-20 | Discharge: 2016-03-20 | Disposition: A | Discharge status: Home / Self Care | Payer: 59 | Source: Ambulatory Visit | Attending: General Surgery | Admitting: General Surgery

## 2016-03-20 DIAGNOSIS — Z79899 Other long term (current) drug therapy: Secondary | ICD-10-CM | POA: Insufficient documentation

## 2016-03-20 DIAGNOSIS — Z794 Long term (current) use of insulin: Secondary | ICD-10-CM | POA: Insufficient documentation

## 2016-03-20 DIAGNOSIS — E119 Type 2 diabetes mellitus without complications: Secondary | ICD-10-CM

## 2016-03-20 DIAGNOSIS — K5732 Diverticulitis of large intestine without perforation or abscess without bleeding: Secondary | ICD-10-CM | POA: Diagnosis not present

## 2016-03-20 DIAGNOSIS — K921 Melena: Secondary | ICD-10-CM | POA: Insufficient documentation

## 2016-03-20 DIAGNOSIS — C187 Malignant neoplasm of sigmoid colon: Secondary | ICD-10-CM

## 2016-03-20 DIAGNOSIS — D490 Neoplasm of unspecified behavior of digestive system: Secondary | ICD-10-CM

## 2016-03-20 DIAGNOSIS — K56699 Other intestinal obstruction unspecified as to partial versus complete obstruction: Secondary | ICD-10-CM | POA: Diagnosis not present

## 2016-03-20 DIAGNOSIS — I10 Essential (primary) hypertension: Secondary | ICD-10-CM

## 2016-03-20 DIAGNOSIS — Z6831 Body mass index (BMI) 31.0-31.9, adult: Secondary | ICD-10-CM

## 2016-03-20 DIAGNOSIS — R634 Abnormal weight loss: Secondary | ICD-10-CM | POA: Insufficient documentation

## 2016-03-20 HISTORY — DX: Type 2 diabetes mellitus without complications: E11.9

## 2016-03-20 HISTORY — PX: BIOPSY: SHX5522

## 2016-03-20 HISTORY — PX: COLONOSCOPY: SHX5424

## 2016-03-20 LAB — CBC
HEMATOCRIT: 30.9 % — AB (ref 39.0–52.0)
HEMOGLOBIN: 10.4 g/dL — AB (ref 13.0–17.0)
MCH: 26.8 pg (ref 26.0–34.0)
MCHC: 33.7 g/dL (ref 30.0–36.0)
MCV: 79.6 fL (ref 78.0–100.0)
Platelets: 173 10*3/uL (ref 150–400)
RBC: 3.88 MIL/uL — ABNORMAL LOW (ref 4.22–5.81)
RDW: 14.6 % (ref 11.5–15.5)
WBC: 6.8 10*3/uL (ref 4.0–10.5)

## 2016-03-20 LAB — COMPREHENSIVE METABOLIC PANEL
ALBUMIN: 3.2 g/dL — AB (ref 3.5–5.0)
ALK PHOS: 53 U/L (ref 38–126)
ALT: 13 U/L — ABNORMAL LOW (ref 17–63)
ANION GAP: 8 (ref 5–15)
AST: 16 U/L (ref 15–41)
BUN: 12 mg/dL (ref 6–20)
CO2: 28 mmol/L (ref 22–32)
Calcium: 8.8 mg/dL — ABNORMAL LOW (ref 8.9–10.3)
Chloride: 103 mmol/L (ref 101–111)
Creatinine, Ser: 0.98 mg/dL (ref 0.61–1.24)
GFR calc Af Amer: >60 mL/min (ref >60)
GLUCOSE: 128 mg/dL — AB (ref 65–99)
Potassium: 3.2 mmol/L — ABNORMAL LOW (ref 3.5–5.1)
Sodium: 139 mmol/L (ref 135–145)
TOTAL PROTEIN: 5.5 g/dL — AB (ref 6.5–8.1)
Total Bilirubin: 0.8 mg/dL (ref 0.3–1.2)

## 2016-03-20 LAB — TYPE AND SCREEN
ABO/RH(D): A POS
Antibody Screen: NEGATIVE

## 2016-03-20 LAB — GLUCOSE, CAPILLARY: Glucose-Capillary: 148 mg/dL — ABNORMAL HIGH (ref 65–99)

## 2016-03-20 SURGERY — COLONOSCOPY
Anesthesia: Moderate Sedation

## 2016-03-20 MED ORDER — MIDAZOLAM HCL 5 MG/5ML IJ SOLN
INTRAMUSCULAR | Status: AC
  Filled 2016-03-20: qty 10

## 2016-03-20 MED ORDER — MEPERIDINE HCL 50 MG/ML IJ SOLN
INTRAMUSCULAR | Status: DC | PRN
  Administered 2016-03-20 (×2): 25 mg via INTRAVENOUS

## 2016-03-20 MED ORDER — MIDAZOLAM HCL 5 MG/5ML IJ SOLN
INTRAMUSCULAR | Status: DC | PRN
  Administered 2016-03-20 (×3): 2 mg via INTRAVENOUS

## 2016-03-20 MED ORDER — STERILE WATER FOR IRRIGATION IR SOLN
Status: DC | PRN
  Administered 2016-03-20: 08:00:00

## 2016-03-20 MED ORDER — MEPERIDINE HCL 50 MG/ML IJ SOLN
INTRAMUSCULAR | Status: AC
  Filled 2016-03-20: qty 1

## 2016-03-20 MED ORDER — POTASSIUM CHLORIDE CRYS ER 20 MEQ PO TBCR: 20.0000 meq | EXTENDED_RELEASE_TABLET | Freq: Every day | ORAL | 1 refills | Status: DC

## 2016-03-20 MED ORDER — SODIUM CHLORIDE 0.9 % IV SOLN
INTRAVENOUS | Status: DC
  Administered 2016-03-20: 07:00:00 via INTRAVENOUS

## 2016-03-20 NOTE — Progress Notes (Signed)
Dr. Arnoldo Morale in to discuss scheduled surgery with patient and wife.  Delphia Grates RN in to discuss preoperative instructions with wife and patient. Wife verbalized understanding of instructions.

## 2016-03-20 NOTE — H&P (Signed)
Troy Smith is an 61 y.o. male.   Chief Complaint: Sigmoid colon neoplasm, near obstructing HPI: Troy Smith is a 61 year old white male who underwent a colonoscopy by Dr. Laural Golden for blood per rectum and weight loss and was found to have a near obstructing sigmoid colon neoplasm. Biopsies are pending. Troy Smith was referred to my care for surgical intervention.  Past Medical History:  Diagnosis Date  . Carpal tunnel syndrome 05/02/2014   Bilateral  . Cervical disc disorder with radiculopathy of cervical region 05/02/2014   Left C8  . Diabetes mellitus without complication ([place])   . Hypertension     Past Surgical History:  Procedure Laterality Date  . NO PAST SURGERIES    Laparoscopic appendectomy in the past  Family History  Problem Relation Age of Onset  . COPD Mother   . Melanoma Father    Social History:  reports that he has never smoked. He has never used smokeless tobacco. He reports that he does not drink alcohol or use drugs.  Allergies: No Known Allergies  No prescriptions prior to admission.   Amlodipine, hydrodiuril, Lantus insulin, metoprolol Results for orders placed or performed during the hospital encounter of 03/20/16 (from the past 48 hour(s))  Glucose, capillary     Status: Abnormal   Collection Time: 03/20/16  7:20 AM  Result Value Ref Range   Glucose-Capillary 148 (H) 65 - 99 mg/dL  CBC     Status: Abnormal   Collection Time: 03/20/16  8:05 AM  Result Value Ref Range   WBC 6.8 4.0 - 10.5 K/uL   RBC 3.88 (L) 4.22 - 5.81 MIL/uL   Hemoglobin 10.4 (L) 13.0 - 17.0 g/dL   HCT 30.9 (L) 39.0 - 52.0 %   MCV 79.6 78.0 - 100.0 fL   MCH 26.8 26.0 - 34.0 pg   MCHC 33.7 30.0 - 36.0 g/dL   RDW 14.6 11.5 - 15.5 %   Platelets 173 150 - 400 K/uL  Comprehensive metabolic panel     Status: Abnormal   Collection Time: 03/20/16  8:42 AM  Result Value Ref Range   Sodium 139 135 - 145 mmol/L   Potassium 3.2 (L) 3.5 - 5.1 mmol/L   Chloride 103 101 - 111 mmol/L   CO2 28 22  - 32 mmol/L   Glucose, Bld 128 (H) 65 - 99 mg/dL   BUN 12 6 - 20 mg/dL   Creatinine, Ser 0.98 0.61 - 1.24 mg/dL   Calcium 8.8 (L) 8.9 - 10.3 mg/dL   Total Protein 5.5 (L) 6.5 - 8.1 g/dL   Albumin 3.2 (L) 3.5 - 5.0 g/dL   AST 16 15 - 41 U/L   ALT 13 (L) 17 - 63 U/L   Alkaline Phosphatase 53 38 - 126 U/L   Total Bilirubin 0.8 0.3 - 1.2 mg/dL   GFR calc non Af Amer >60 >60 mL/min   GFR calc Af Amer >60 >60 mL/min    Comment: (NOTE) The eGFR has been calculated using the CKD EPI equation. This calculation has not been validated in all clinical situations. eGFR's persistently <60 mL/min signify possible Chronic Kidney Disease.    Anion gap 8 5 - 15  Type and screen [facility]     Status: None   Collection Time: 03/20/16  8:42 AM  Result Value Ref Range   ABO/RH(D) A POS    Antibody Screen NEG    Sample Expiration 04/03/2016    Extend sample reason NO TRANSFUSIONS OR PREGNANCY IN THE  PAST 3 MONTHS    Chest 2 View  Result Date: 03/20/2016 CLINICAL DATA:  Preop colon cancer EXAM: CHEST  2 VIEW COMPARISON:  09/14/2014 FINDINGS: There is linear scarring in the lung bases. Heart is normal size. No acute airspace opacities or effusions. No acute bony abnormality. IMPRESSION: Bibasilar scarring, stable.  No active disease. Electronically Signed   By: Kevin  Dover M.D.   On: 03/20/2016 10:01    ROS  positive for weight loss and blood per rectum, otherwise negative.  There were no vitals taken for this visit. Physical Exam well-developed well-nourished white male in no acute distress. Head is normocephalic, atraumatic Neck is supple without lymphadenopathy or JVD. Lungs clear auscultation with breath sounds bilaterally. Heart examination reveals a regular rate and rhythm with a viscera, S4, murmurs. Abdomen is soft, nontender, nondistended. Colonoscopy images and report reviewed by myself with Dr.  Rehman.  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 The risks and benefits of the procedure including bleeding, infection, and the possibility of a blood transfusion were fully explained to the Troy Smith, who gave informed consent. Troy Smith to remain on clear liquid diet to just had his colonoscopy and bowel preparation today. Neomycin and Flagyl will be prescribed.  , A, MD 03/20/2016, 11:03 AM   

## 2016-03-20 NOTE — H&P (Signed)
Troy Smith is an 61 y.o. male.   Chief Complaint: Patient is here for colonoscopy. 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 He was seen about 3 weeks ago when he was recovering from an episode. He finished antibiotics over 2 weeks ago and has been pain-free. He has noted occasional blood in the stool with his bowel movement. He has not lost any more weight. Last colonoscopy was 10 years ago with removal of small adenoma but he is not had follow-up exam. That exam was done in [facility]. Family history is negative for CRC.  Past Medical History:  Diagnosis Date  . Carpal tunnel syndrome 05/02/2014   Bilateral  . Cervical disc disorder with radiculopathy of cervical region 05/02/2014   Left C8  . Diabetes mellitus without complication ([place])   . Hypertension     Past Surgical History:  Procedure Laterality Date  . NO PAST SURGERIES      Family History  Problem Relation Age of Onset  . COPD Mother   . Melanoma Father    Social History:  reports that he has never smoked. He has never used smokeless tobacco. He reports that he does not drink alcohol or use drugs.  Allergies: No Known Allergies  Medications Prior to Admission  Medication Sig Dispense Refill  . amLODipine (NORVASC) 10 MG tablet Take 10 mg by mouth daily.    . hydrochlorothiazide (HYDRODIURIL) 25 MG tablet Take 25 mg by mouth daily.     . insulin glargine (LANTUS) 100 UNIT/ML injection Inject 30 Units into the skin at bedtime.     . metoprolol succinate (TOPROL-XL) 50 MG 24 hr tablet Take 50 mg by mouth daily. Take with or immediately following a meal.      No results found for this or any previous visit (from the past 48 hour(s)). No results found.  ROS  Blood pressure (!) 128/94, pulse 81, temperature (!) 96.8 F (36 C), temperature source Oral, resp. rate (!) 23, height 5\' 11"  (1.803 m), weight 224 lb (101.6 kg), SpO2 99 %. Physical  Exam  Constitutional: He appears well-developed and well-nourished.  HENT:  Mouth/Throat: Oropharynx is clear and moist.  Eyes: Conjunctivae are normal. No scleral icterus.  Neck: No thyromegaly present.  Cardiovascular: Normal rate, regular rhythm and normal heart sounds.   No murmur heard. Respiratory: Effort normal and breath sounds normal.  GI: Soft. He exhibits no distension and no mass. There is no tenderness.  Musculoskeletal: He exhibits no edema.  Lymphadenopathy:    He has no cervical adenopathy.  Neurological: He is alert.  Skin: Skin is warm and dry.     Assessment/Plan History of significant diverticulitis. Weight loss. History of colonic adenoma. Diagnostic colonoscopy.  Hildred Laser, MD 03/20/2016, 7:29 AM

## 2016-03-20 NOTE — Op Note (Signed)
[facility] Patient Name: Troy Smith Procedure Date: 03/20/2016 7:14 AM MRN: [id number] Date of Birth: 03/15/1955 Attending MD: Hildred Laser , MD CSN: [id number] Age: 61 Admit Type: Outpatient Procedure:                Colonoscopy Indications:              Hematochezia, Follow-up of diverticulitis Providers:                Hildred Laser, MD, Otis Peak B. Sharon Seller, RN, Randa Spike, Technician Referring MD:             [employer], MD Medicines:                Meperidine 50 mg IV, Midazolam 6 mg IV Complications:            No immediate complications. Estimated Blood Loss:     Estimated blood loss was minimal. Procedure:                Pre-Anesthesia Assessment:                           - Prior to the procedure, a History and Physical                            was performed, and patient medications and                            allergies were reviewed. The patient's tolerance of                            previous anesthesia was also reviewed. The risks                            and benefits of the procedure and the sedation                            options and risks were discussed with the patient.                            All questions were answered, and informed consent                            was obtained. Prior Anticoagulants: The patient has                            taken no previous anticoagulant or antiplatelet                            agents. ASA Grade Assessment: II - A patient with                            mild systemic disease. After reviewing the risks  and benefits, the patient was deemed in                            satisfactory condition to undergo the procedure.                           After obtaining informed consent, the colonoscope                            was passed under direct vision. Throughout the                            procedure, the patient's blood pressure, pulse, and                             oxygen saturations were monitored continuously. The                            [id number]) was introduced through the                            anus and advanced to the the sigmoid colon. The                            colonoscopy was performed without difficulty. The                            patient tolerated the procedure well. The quality                            of the bowel preparation was excellent. The rectum                            was photographed. Scope In: 7:40:03 AM Scope Out: 7:55:29 AM Total Procedure Duration: 0 hours 15 minutes 26 seconds  Findings:      The perianal and digital rectal examinations were normal.      A fungating, polypoid and ulcerated partially obstructing large mass was       found in the distal sigmoid colon. The mass was circumferential with       high-grade stricture. Pediatric or slim scope could not be advanced       proximal to this mass. The mass measured five cm in length. Oozing was       present. Mucosa was biopsied with a cold forceps for histology.      No additional abnormalities were found on retroflexion. 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 Biopsied.                           - Incomplete exam. Moderate Sedation:      Moderate (conscious) sedation was administered by the endoscopy nurse  and supervised by the endoscopist. The following parameters were       monitored: oxygen saturation, heart rate, blood pressure, CO2       capnography and response to care. Total physician intraservice time was       21 minutes. Recommendation:           - Patient has a contact number available for                            emergencies. The signs and symptoms of potential                            delayed complications were discussed with the                            patient. Return to normal activities tomorrow.                             Written discharge instructions were provided to the                            patient.                           - Clear liquid diet today.                           - Continue present medications.                           - Await pathology results.                           - CBC, comprehensive chemistry panel and CEA today.                           - Surgical consultation with Dr. Arnoldo Morale.                           - Repeat colonoscopy in 3 months. Procedure Code(s):        --- Professional ---                           [phone], 53, Colonoscopy, flexible; diagnostic,                            including collection of specimen(s) by brushing or                            washing, when performed (separate procedure)                           99152, Moderate sedation services provided by the                            same physician or other qualified health care  professional performing the diagnostic or                            therapeutic service that the sedation supports,                            requiring the presence of an independent trained                            observer to assist in the monitoring of the                            patient's level of consciousness and physiological                            status; initial 15 minutes of intraservice time,                            patient age 73 years or older Diagnosis Code(s):        --- Professional ---                           C18.7, Malignant neoplasm of sigmoid colon                           K92.1, Melena (includes Hematochezia)                           K57.32, Diverticulitis of large intestine without                            perforation or abscess without bleeding CPT copyright 2016 [employer]. All rights reserved. The codes documented in this report are preliminary and upon coder review may  be revised to meet current compliance  requirements. Hildred Laser, MD Hildred Laser, MD 03/20/2016 8:24:05 AM This report has been signed electronically. Number of Addenda: 0

## 2016-03-20 NOTE — Discharge Instructions (Signed)
Resume usual medications. Low fiber diet. No driving for 24 hours. Physician will call results of biopsy and blood tests.   Colonoscopy, Adult, Care After This sheet gives you information about how to care for yourself after your procedure. Your health care provider may also give you more specific instructions. If you have problems or questions, contact your health care provider. What can I expect after the procedure? After the procedure, it is common to have:  A small amount of blood in your stool for 24 hours after the procedure.  Some gas.  Mild abdominal cramping or bloating. Follow these instructions at home: General instructions  For the first 24 hours after the procedure:  Do not drive or use machinery.  Do not sign important documents.  Do not drink alcohol.  Do your regular daily activities at a slower pace than normal.  Eat soft, easy-to-digest foods.  Rest often.  Take over-the-counter or prescription medicines only as told by your health care provider.  It is up to you to get the results of your procedure. Ask your health care provider, or the department performing the procedure, when your results will be ready. Relieving cramping and bloating  Try walking around when you have cramps or feel bloated.  Apply heat to your abdomen as told by your health care provider. Use a heat source that your health care provider recommends, such as a moist heat pack or a heating pad.  Place a towel between your skin and the heat source.  Leave the heat on for 20-30 minutes.  Remove the heat if your skin turns bright red. This is especially important if you are unable to feel pain, heat, or cold. You may have a greater risk of getting burned. Eating and drinking  Drink enough fluid to keep your urine clear or pale yellow.  Resume your normal diet as instructed by your health care provider. Avoid heavy or fried foods that are hard to digest.  Avoid drinking alcohol for  as long as instructed by your health care provider. Contact a health care provider if:  You have blood in your stool 2-3 days after the procedure. Get help right away if:  You have more than a small spotting of blood in your stool.  You pass large blood clots in your stool.  Your abdomen is swollen.  You have nausea or vomiting.  You have a fever.  You have increasing abdominal pain that is not relieved with medicine.      Low-Fiber Diet Fiber is found in fruits, vegetables, and whole grains. A low-fiber diet restricts fibrous foods that are not digested in the small intestine. A diet containing about 10-15 grams of fiber per day is considered low fiber. Low-fiber diets may be used to:  Promote healing and rest the bowel during intestinal flare-ups.  Prevent blockage of a partially obstructed or narrowed gastrointestinal tract.  Reduce fecal weight and volume.  Slow the movement of feces. You may be on a low-fiber diet as a transitional diet following surgery, after an injury (trauma), or because of a short (acute) or lifelong (chronic) illness. Your health care provider will determine the length of time you need to stay on this diet. What do I need to know about a low-fiber diet? Always check the fiber content on the packaging's Nutrition Facts label, especially on foods from the grains list. Ask your dietitian if you have questions about specific foods that are related to your condition, especially if the food is  not listed below. In general, a low-fiber food will have less than 2 g of fiber. What foods can I eat? Grains  All breads and crackers made with white flour. Sweet rolls, doughnuts, waffles, pancakes, [place] toast, bagels. Pretzels, Melba toast, zwieback. Well-cooked cereals, such as cornmeal, farina, or cream cereals. Dry cereals that do not contain whole grains, fruit, or nuts, such as refined corn, wheat, rice, and oat cereals. Potatoes prepared any way without  skins, plain pastas and noodles, refined white rice. Use white flour for baking and making sauces. Use allowed list of grains for casseroles, dumplings, and puddings. Vegetables  Strained tomato and vegetable juices. Fresh lettuce, cucumber, spinach. Well-cooked (no skin or pulp) or canned vegetables, such as asparagus, bean sprouts, beets, carrots, green beans, mushrooms, potatoes, pumpkin, spinach, yellow squash, tomato sauce/puree, turnips, yams, and zucchini. Keep servings limited to  cup. Fruits  All fruit juices except prune juice. Cooked or canned fruits without skin and seeds, such as applesauce, apricots, cherries, fruit cocktail, grapefruit, grapes, mandarin oranges, melons, peaches, pears, pineapple, and plums. Fresh fruits without skin, such as apricots, avocados, bananas, melons, pineapple, nectarines, and peaches. Keep servings limited to  cup or 1 piece. Meat and Other Protein Sources  Ground or well-cooked tender beef, ham, veal, lamb, pork, or poultry. Eggs, plain cheese. Fish, oysters, shrimp, lobster, and other seafood. Liver, organ meats. Smooth nut butters. Dairy  All milk products and alternative dairy substitutes, such as soy, rice, almond, and coconut, not containing added whole nuts, seeds, or added fruit. Beverages  Decaf coffee, fruit, and vegetable juices or smoothies (small amounts, with no pulp or skins, and with fruits from allowed list), sports drinks, herbal tea. Condiments  Ketchup, mustard, vinegar, cream sauce, cheese sauce, cocoa powder. Spices in moderation, such as allspice, basil, bay leaves, celery powder or leaves, cinnamon, cumin powder, curry powder, ginger, mace, marjoram, onion or garlic powder, oregano, paprika, parsley flakes, ground pepper, rosemary, sage, savory, tarragon, thyme, and turmeric. Sweets and Desserts  Plain cakes and cookies, pie made with allowed fruit, pudding, custard, cream pie. Gelatin, fruit, ice, sherbet, frozen ice pops. Ice  cream, ice milk without nuts. Plain hard candy, honey, jelly, molasses, syrup, sugar, chocolate syrup, gumdrops, marshmallows. Limit overall sugar intake. Fats and Oil  Margarine, butter, cream, mayonnaise, salad oils, plain salad dressings made from allowed foods. Choose healthy fats such as olive oil, canola oil, and omega-3 fatty acids (such as found in salmon or tuna) when possible. Other  Bouillon, broth, or cream soups made from allowed foods. Any strained soup. Casseroles or mixed dishes made with allowed foods. The items listed above may not be a complete list of recommended foods or beverages. Contact your dietitian for more options.  What foods are not recommended? Grains  All whole wheat and whole grain breads and crackers. Multigrains, rye, bran seeds, nuts, or coconut. Cereals containing whole grains, multigrains, bran, coconut, nuts, raisins. Cooked or dry oatmeal, steel-cut oats. Coarse wheat cereals, granola. Cereals advertised as high fiber. Potato skins. Whole grain pasta, wild or brown rice. Popcorn. Coconut flour. Bran, buckwheat, corn bread, multigrains, rye, wheat germ. Vegetables  Fresh, cooked or canned vegetables, such as artichokes, asparagus, beet greens, broccoli, Brussels sprouts, cabbage, celery, cauliflower, corn, eggplant, kale, legumes or beans, okra, peas, and tomatoes. Avoid large servings of any vegetables, especially raw vegetables. Fruits  Fresh fruits, such as apples with or without skin, berries, cherries, figs, grapes, grapefruit, guavas, kiwis, mangoes, oranges, papayas, pears, persimmons, pineapple, and  pomegranate. Prune juice and juices with pulp, stewed or dried prunes. Dried fruits, dates, raisins. Fruit seeds or skins. Avoid large servings of all fresh fruits. Meats and Other Protein Sources  Tough, fibrous meats with gristle. Chunky nut butter. Cheese made with seeds, nuts, or other foods not recommended. Nuts, seeds, legumes (beans, including baked  beans), dried peas, beans, lentils. Dairy  Yogurt or cheese that contains nuts, seeds, or added fruit. Beverages  Fruit juices with high pulp, prune juice. Caffeinated coffee and teas. Condiments  Coconut, maple syrup, pickles, olives. Sweets and Desserts  Desserts, cookies, or candies that contain nuts or coconut, chunky peanut butter, dried fruits. Jams, preserves with seeds, marmalade. Large amounts of sugar and sweets. Any other dessert made with fruits from the not recommended list. Other  Soups made from vegetables that are not recommended or that contain other foods not recommended. The items listed above may not be a complete list of foods and beverages to avoid. Contact your dietitian for more information.  This information is not intended to replace advice given to you by your health care provider. Make sure you discuss any questions you have with your health care provider. Document Released: 07/18/2001 Document Revised: 07/04/2015 Document Reviewed: 12/19/2012 Elsevier Interactive Patient Education  2017 [place].      Dr. Arnoldo Morale will be calling in a prescription to [facility] for Neomycin and Flagyl. You will be taking medication on Sunday as instructed on medication bottle.  Call Pharmacy tomorrow to verify prescription ready.

## 2016-03-20 NOTE — Progress Notes (Signed)
K+ 3.2 Hgb 10.4- Dr. Arnoldo Morale aware, placing patient on supplement.  Dr. Patsey Berthold aware to do istat morning of surgery.

## 2016-03-20 NOTE — Patient Instructions (Signed)
Your procedure is scheduled on: 03/23/2016  Report to [facility] at     AM.  Call this number if you have problems the morning of surgery: [phone]   Remember:   Do not drink or eat food:After Midnight.  :  Take these medicines the morning of surgery with A SIP OF WATER: Amlodipine and Metoprolol              Take only 1/2 dose lantus 15 units night before surgery   Do not wear jewelry, make-up or nail polish.  Do not wear lotions, powders, or perfumes. You may wear deodorant.  Do not shave 48 hours prior to surgery. Men may shave face and neck.  Do not bring valuables to the hospital.  Contacts, dentures or bridgework may not be worn into surgery.  Leave suitcase in the car. After surgery it may be brought to your room.  For patients admitted to the hospital, checkout time is 11:00 AM the day of discharge.   Patients discharged the day of surgery will not be allowed to drive home.    Special Instructions: Shower using CHG night before surgery and shower the day of surgery use CHG.  Use special wash - you have one bottle of CHG for all showers.  You should use approximately 1/2 of the bottle for each shower.   Colectomy An open colectomy is surgery to remove part or all of the large intestine (colon). This procedure may be used to treat several conditions, including:  Inflammation and infection of the colon (diverticulitis).  Tumors or masses in the colon.  Inflammatory bowel disease, such as Crohn disease or ulcerative colitis.  Bleeding from the colon.  Blockage or obstruction of the colon. Tell a health care provider about:  Any allergies you have.  All medicines you are taking, including vitamins, herbs, eye drops, creams, and over-the-counter medicines.  Any problems you or family members have had with anesthetic medicines.  Any blood disorders you have.  Any surgeries you have had.  Any medical conditions you have.  Whether you are pregnant or may be  pregnant.  Whether you smoke or use tobacco products. These can affect your body's reaction to anesthesia. What are the risks? Generally, this is a safe procedure. However, problems may occur, including:  Infection.  Bleeding.  Allergic reactions to medicines.  Damage to other structures or organs.  Pneumonia.  The incision opening up.  Tissues from inside the abdomen bulging through the incision (hernia).  Reopening of the colon where it was stitched or stapled together.  A blood clot forming in a vein and traveling to the lungs.  Future blockage of the small intestine from scar tissue. What happens before the procedure? Staying hydrated  Follow instructions from your health care provider about hydration, which may include:  Up to 2 hours before the procedure - you may continue to drink clear liquids, such as water, clear fruit juice, black coffee, and plain tea. Eating and drinking restrictions  Follow instructions from your health care provider about eating and drinking, which may include:  8 hours before the procedure - stop eating heavy meals or foods such as meat, fried foods, or fatty foods.  6 hours before the procedure - stop eating light meals or foods, such as toast or cereal.  6 hours before the procedure - stop drinking milk or drinks that contain milk.  2 hours before the procedure - stop drinking clear liquids. Bowel prep  In some cases,  you may be prescribed an oral bowel prep to clean out your colon. If so:  Take it as told by your health care provider. Starting the day before your procedure, you may need to drink a large amount of medicated liquid. The liquid will cause you to have multiple loose stools until your stool is almost clear or light green.  Follow instructions from your health care provider about eating and drinking restrictions during bowel prep. Medicines  Ask your health care provider about:  Changing or stopping your regular  medicines or vitamins. This is especially important if you are taking diabetes medicines, blood thinners, or vitamin E.  Taking medicines such as aspirin and ibuprofen. These medicines can thin your blood. Do not take these medicines before your procedure if your health care provider instructs you not to.  If you were prescribed an antibiotic medicine, take it as told by your health care provider. General instructions  Bring loose-fitting, comfortable clothing and slip-on shoes that you can put on without bending over.  Make sure to see your health care provider for any tests that you need before the procedure, such as:  Blood tests.  A test to check the heart's rhythm (electrocardiogram, ECG).  A CT scan of your abdomen.  Urine tests.  Colonoscopy.  Plan to have someone take you home from the hospital or clinic.  Arrange for someone to help you with your activities during your recovery. What happens during the procedure?  To reduce your risk of infection:  Your health care team will wash or sanitize their hands.  Your skin will be washed with soap.  Hair may be removed from the surgical area.  An IV tube will be inserted into one of your veins. The tube will be used to give you medicines and fluids.  You will be given a medicine to make you fall asleep (general anesthetic). You may also be given a medicine to help you relax (sedative).  Small monitors will be connected to your body. They will be used to check your heart, blood pressure, and oxygen level.  A breathing tube may be placed into your lungs during the procedure.  A thin, flexible tube (catheter) will be placed into your bladder to drain urine.  A tube may be inserted through your nose and into your stomach (nasogastric tube, or NG tube). The tube is used to remove stomach fluids after surgery until the intestines start working again.  An incision will be made in your abdomen.  Clamps or staples will be put  on your colon.  The part of the colon between the clamps or staples will be removed.  The ends of the colon that remain will be stitched or stapled together.  The incision in your abdomen will be closed with stitches (sutures) or staples.  The incision will be covered with a bandage (dressing).  A small opening (stoma) may be created in your lower abdomen. A removable, external pouch (ostomy pouch) will be attached to the stoma. This pouch will collect stool outside of your body. Stool passes through the stoma and into the pouch instead of through your anus. The procedure may vary among health care providers and hospitals. What happens after the procedure?  Your blood pressure, heart rate, breathing rate, and blood oxygen level will be monitored until the medicines you were given have worn off.  You may continue to receive fluids and medicines through an IV tube.  You will start on a clear liquid diet  and gradually go back to a normal diet.  Do not drive until your health care provider approves.  You may have some pain in your abdomen. You will be given pain medicine to control the pain.  You will be encouraged to do the following:  Do breathing exercises to prevent pneumonia.  Get up and start walking within a day after surgery. You should try to get up 5-6 times a day. This information is not intended to replace advice given to you by your health care provider. Make sure you discuss any questions you have with your health care provider. Document Released: 11/23/2008 Document Revised: 10/28/2015 Document Reviewed: 10/28/2015 Elsevier Interactive Patient Education  2017 [place] After This sheet gives you information about how to care for yourself after your procedure. Your health care provider may also give you more specific instructions. If you have problems or questions, contact your health care provider. What can I expect after the procedure? After the  procedure, it is common to have:  Pain in your abdomen, especially along your incision.  Tiredness. Your energy level will return to normal over the next several weeks.  Constipation.  Nausea.  Difficulty urinating. Follow these instructions at home: Activity  You may be able to return to most of your normal activities within 1-2 weeks, such as working, walking up stairs, and sexual activity.  Avoid activities that require a lot of energy for 4-6 weeks after surgery, such as running, climbing, and lifting heavy objects. Ask your health care provider what activities are safe for you.  Take rest breaks during the day as needed.  Do not drive for 1-2 weeks or until your health care provider says that it is safe.  Do not drive or use heavy machinery while taking prescription pain medicines.  Do not lift anything that is heavier than 10 lb (4.3 kg) until your health care provider says that it is safe. Incision care  Follow instructions from your health care provider about how to take care of your incision. Make sure you:  Wash your hands with soap and water before you change your bandage (dressing). If soap and water are not available, use hand sanitizer.  Change your dressing as told by your health care provider.  Leave stitches (sutures) or staples in place. These skin closures may need to stay in place for 2 weeks or longer.  Avoid wearing tight clothing around your incision.  Protect your incision area from the sun.  Check your incision area every day for signs of infection. Check for:  More redness, swelling, or pain.  More fluid or blood.  Warmth.  Pus or a bad smell. General instructions  Do not take baths, swim, or use a hot tub until your health care provider approves. Ask your health care provider when you may shower.  Take over-the-counter and prescription medicines, including stool softeners, only as told by your health care provider.  Eat a low-fat and  low-fiber diet for the first 4 weeks after surgery.  Keep all follow-up visits as told by your health care provider. This is important. Contact a health care provider if:  You have more redness, swelling, or pain around your incision.  You have more fluid or blood coming from your incision.  Your incision feels warm to the touch.  You have pus or a bad smell coming from your incision.  You have a fever or chills.  You do not have a bowel movement 2-3 days  after surgery.  You cannot eat or drink for 24 hours or more.  You have persistent nausea and vomiting.  You have abdominal pain that gets worse and does not get better with medicine. Get help right away if:  You have chest pain.  You have shortness of breath.  You have pain or swelling in your legs.  Your incision breaks open after your sutures or staples have been removed.  You have bleeding from the rectum. This information is not intended to replace advice given to you by your health care provider. Make sure you discuss any questions you have with your health care provider. Document Released: 08/19/2010 Document Revised: 10/28/2015 Document Reviewed: 10/28/2015 Elsevier Interactive Patient Education  2017 [place] Anesthesia, Adult, Care After These instructions provide you with information about caring for yourself after your procedure. Your health care provider may also give you more specific instructions. Your treatment has been planned according to current medical practices, but problems sometimes occur. Call your health care provider if you have any problems or questions after your procedure. What can I expect after the procedure? After the procedure, it is common to have:  Vomiting.  A sore throat.  Mental slowness. It is common to feel:  Nauseous.  Cold or shivery.  Sleepy.  Tired.  Sore or achy, even in parts of your body where you did not have surgery. Follow these instructions at  home: For at least 24 hours after the procedure:  Do not:  Participate in activities where you could fall or become injured.  Drive.  Use heavy machinery.  Drink alcohol.  Take sleeping pills or medicines that cause drowsiness.  Make important decisions or sign legal documents.  Take care of children on your own.  Rest. Eating and drinking  If you vomit, drink water, juice, or soup when you can drink without vomiting.  Drink enough fluid to keep your urine clear or pale yellow.  Make sure you have little or no nausea before eating solid foods.  Follow the diet recommended by your health care provider. General instructions  Have a responsible adult stay with you until you are awake and alert.  Return to your normal activities as told by your health care provider. Ask your health care provider what activities are safe for you.  Take over-the-counter and prescription medicines only as told by your health care provider.  If you smoke, do not smoke without supervision.  Keep all follow-up visits as told by your health care provider. This is important. Contact a health care provider if:  You continue to have nausea or vomiting at home, and medicines are not helpful.  You cannot drink fluids or start eating again.  You cannot urinate after 8-12 hours.  You develop a skin rash.  You have fever.  You have increasing redness at the site of your procedure. Get help right away if:  You have difficulty breathing.  You have chest pain.  You have unexpected bleeding.  You feel that you are having a life-threatening or urgent problem. This information is not intended to replace advice given to you by your health care provider. Make sure you discuss any questions you have with your health care provider. Document Released: 05/04/2000 Document Revised: 07/01/2015 Document Reviewed: 01/10/2015 Elsevier Interactive Patient Education  2017 [employer].

## 2016-03-21 LAB — CEA: CEA: 13.2 ng/mL — AB (ref 0.0–4.7)

## 2016-03-23 ENCOUNTER — Encounter (HOSPITAL_COMMUNITY)
Admission: RE | Disposition: A | Discharge status: Home / Self Care | Payer: Self-pay | Source: Ambulatory Visit | Attending: General Surgery

## 2016-03-23 ENCOUNTER — Inpatient Hospital Stay (HOSPITAL_COMMUNITY): Payer: 59 | Admitting: Anesthesiology

## 2016-03-23 ENCOUNTER — Inpatient Hospital Stay (HOSPITAL_COMMUNITY)
Admission: RE | Admit: 2016-03-23 | Discharge: 2016-03-25 | DRG: 330 | Disposition: A | Discharge status: Home / Self Care | Payer: 59 | Source: Ambulatory Visit | Attending: General Surgery | Admitting: General Surgery

## 2016-03-23 ENCOUNTER — Encounter (HOSPITAL_COMMUNITY): Payer: Self-pay | Admitting: *Deleted

## 2016-03-23 DIAGNOSIS — Z825 Family history of asthma and other chronic lower respiratory diseases: Secondary | ICD-10-CM | POA: Diagnosis not present

## 2016-03-23 DIAGNOSIS — E119 Type 2 diabetes mellitus without complications: Secondary | ICD-10-CM | POA: Diagnosis present

## 2016-03-23 DIAGNOSIS — D490 Neoplasm of unspecified behavior of digestive system: Principal | ICD-10-CM | POA: Diagnosis present

## 2016-03-23 DIAGNOSIS — I10 Essential (primary) hypertension: Secondary | ICD-10-CM | POA: Diagnosis present

## 2016-03-23 DIAGNOSIS — C187 Malignant neoplasm of sigmoid colon: Secondary | ICD-10-CM

## 2016-03-23 DIAGNOSIS — Z808 Family history of malignant neoplasm of other organs or systems: Secondary | ICD-10-CM

## 2016-03-23 DIAGNOSIS — Z794 Long term (current) use of insulin: Secondary | ICD-10-CM

## 2016-03-23 DIAGNOSIS — E118 Type 2 diabetes mellitus with unspecified complications: Secondary | ICD-10-CM | POA: Diagnosis present

## 2016-03-23 DIAGNOSIS — K625 Hemorrhage of anus and rectum: Secondary | ICD-10-CM | POA: Diagnosis present

## 2016-03-23 HISTORY — PX: BOWEL RESECTION: SHX1257

## 2016-03-23 HISTORY — DX: Malignant neoplasm of sigmoid colon: C18.7

## 2016-03-23 LAB — GLUCOSE, CAPILLARY
GLUCOSE-CAPILLARY: 122 mg/dL — AB (ref 65–99)
GLUCOSE-CAPILLARY: 166 mg/dL — AB (ref 65–99)
Glucose-Capillary: 133 mg/dL — ABNORMAL HIGH (ref 65–99)

## 2016-03-23 LAB — POCT I-STAT 4, (NA,K, GLUC, HGB,HCT)
GLUCOSE: 102 mg/dL — AB (ref 65–99)
HCT: 34 % — ABNORMAL LOW (ref 39.0–52.0)
Hemoglobin: 11.6 g/dL — ABNORMAL LOW (ref 13.0–17.0)
POTASSIUM: 3.1 mmol/L — AB (ref 3.5–5.1)
Sodium: 140 mmol/L (ref 135–145)

## 2016-03-23 LAB — HEMOGLOBIN AND HEMATOCRIT, BLOOD
HCT: 32.7 % — ABNORMAL LOW (ref 39.0–52.0)
HEMOGLOBIN: 11.1 g/dL — AB (ref 13.0–17.0)

## 2016-03-23 SURGERY — RESECTION, RECTUM, LOW ANTERIOR
Anesthesia: General

## 2016-03-23 MED ORDER — GLYCOPYRROLATE 0.2 MG/ML IJ SOLN
INTRAMUSCULAR | Status: DC | PRN
  Administered 2016-03-23: 0.6 mg via INTRAVENOUS

## 2016-03-23 MED ORDER — HYDROMORPHONE HCL 1 MG/ML IJ SOLN
0.2500 mg | INTRAMUSCULAR | Status: DC | PRN
  Administered 2016-03-23: 0.5 mg via INTRAVENOUS
  Filled 2016-03-23: qty 0.5

## 2016-03-23 MED ORDER — NEOSTIGMINE METHYLSULFATE 10 MG/10ML IV SOLN
INTRAVENOUS | Status: DC | PRN
  Administered 2016-03-23: 4 mg via INTRAVENOUS

## 2016-03-23 MED ORDER — AMLODIPINE BESYLATE 5 MG PO TABS
10.0000 mg | ORAL_TABLET | Freq: Every day | ORAL | Status: DC
  Administered 2016-03-24: 10 mg via ORAL
  Filled 2016-03-23 (×2): qty 2

## 2016-03-23 MED ORDER — ROCURONIUM BROMIDE 50 MG/5ML IV SOLN
INTRAVENOUS | Status: AC
  Filled 2016-03-23: qty 1

## 2016-03-23 MED ORDER — LACTATED RINGERS IV SOLN
INTRAVENOUS | Status: DC
  Administered 2016-03-23 (×2): via INTRAVENOUS

## 2016-03-23 MED ORDER — ENOXAPARIN SODIUM 40 MG/0.4ML ~~LOC~~ SOLN
40.0000 mg | Freq: Once | SUBCUTANEOUS | Status: AC
  Administered 2016-03-23: 40 mg via SUBCUTANEOUS
  Filled 2016-03-23: qty 0.4

## 2016-03-23 MED ORDER — BUPIVACAINE LIPOSOME 1.3 % IJ SUSP
INTRAMUSCULAR | Status: DC | PRN
  Administered 2016-03-23: 20 mL

## 2016-03-23 MED ORDER — ACETAMINOPHEN 325 MG PO TABS
650.0000 mg | ORAL_TABLET | Freq: Four times a day (QID) | ORAL | Status: DC | PRN
Start: 2016-03-23 — End: 2016-03-25

## 2016-03-23 MED ORDER — FENTANYL CITRATE (PF) 100 MCG/2ML IJ SOLN
INTRAMUSCULAR | Status: AC
  Filled 2016-03-23: qty 2

## 2016-03-23 MED ORDER — POVIDONE-IODINE 10 % EX OINT
TOPICAL_OINTMENT | CUTANEOUS | Status: AC
  Filled 2016-03-23: qty 1

## 2016-03-23 MED ORDER — HYDROCHLOROTHIAZIDE 25 MG PO TABS
25.0000 mg | ORAL_TABLET | Freq: Every day | ORAL | Status: DC
  Administered 2016-03-24: 25 mg via ORAL
  Filled 2016-03-23 (×2): qty 1

## 2016-03-23 MED ORDER — GLYCOPYRROLATE 0.2 MG/ML IJ SOLN
INTRAMUSCULAR | Status: AC
  Filled 2016-03-23: qty 4

## 2016-03-23 MED ORDER — NEOSTIGMINE METHYLSULFATE 10 MG/10ML IV SOLN
INTRAVENOUS | Status: AC
  Filled 2016-03-23: qty 1

## 2016-03-23 MED ORDER — 0.9 % SODIUM CHLORIDE (POUR BTL) OPTIME
TOPICAL | Status: DC | PRN
  Administered 2016-03-23: 1000 mL

## 2016-03-23 MED ORDER — CHLORHEXIDINE GLUCONATE CLOTH 2 % EX PADS: 6.0000 | MEDICATED_PAD | Freq: Once | CUTANEOUS | Status: DC

## 2016-03-23 MED ORDER — POVIDONE-IODINE 10 % OINT PACKET
TOPICAL_OINTMENT | CUTANEOUS | Status: DC | PRN
  Administered 2016-03-23: 1 via TOPICAL

## 2016-03-23 MED ORDER — ONDANSETRON HCL 4 MG/2ML IJ SOLN
INTRAMUSCULAR | Status: AC
  Filled 2016-03-23: qty 2

## 2016-03-23 MED ORDER — ONDANSETRON 4 MG PO TBDP: 4.0000 mg | ORAL_TABLET | Freq: Four times a day (QID) | ORAL | Status: DC | PRN

## 2016-03-23 MED ORDER — SIMETHICONE 80 MG PO CHEW: 40.0000 mg | CHEWABLE_TABLET | Freq: Four times a day (QID) | ORAL | Status: DC | PRN

## 2016-03-23 MED ORDER — ONDANSETRON HCL 4 MG/2ML IJ SOLN: 4.0000 mg | Freq: Four times a day (QID) | INTRAMUSCULAR | Status: DC | PRN

## 2016-03-23 MED ORDER — ROCURONIUM BROMIDE 100 MG/10ML IV SOLN
INTRAVENOUS | Status: DC | PRN
  Administered 2016-03-23: 40 mg via INTRAVENOUS
  Administered 2016-03-23 (×2): 10 mg via INTRAVENOUS

## 2016-03-23 MED ORDER — ALVIMOPAN 12 MG PO CAPS
12.0000 mg | ORAL_CAPSULE | Freq: Two times a day (BID) | ORAL | Status: DC
  Administered 2016-03-24 (×2): 12 mg via ORAL
  Filled 2016-03-23 (×3): qty 1

## 2016-03-23 MED ORDER — SODIUM CHLORIDE 0.9 % IJ SOLN
INTRAMUSCULAR | Status: AC
  Filled 2016-03-23: qty 10

## 2016-03-23 MED ORDER — FENTANYL CITRATE (PF) 100 MCG/2ML IJ SOLN
INTRAMUSCULAR | Status: DC | PRN
  Administered 2016-03-23 (×9): 50 ug via INTRAVENOUS

## 2016-03-23 MED ORDER — EPHEDRINE SULFATE 50 MG/ML IJ SOLN
INTRAMUSCULAR | Status: DC | PRN
  Administered 2016-03-23 (×2): 10 mg via INTRAVENOUS
  Administered 2016-03-23: 5 mg via INTRAVENOUS

## 2016-03-23 MED ORDER — DIPHENHYDRAMINE HCL 50 MG/ML IJ SOLN: 25.0000 mg | Freq: Four times a day (QID) | INTRAMUSCULAR | Status: DC | PRN

## 2016-03-23 MED ORDER — ACETAMINOPHEN 650 MG RE SUPP: 650.0000 mg | Freq: Four times a day (QID) | RECTAL | Status: DC | PRN

## 2016-03-23 MED ORDER — MIDAZOLAM HCL 2 MG/2ML IJ SOLN
1.0000 mg | INTRAMUSCULAR | Status: DC
  Administered 2016-03-23: 2 mg via INTRAVENOUS

## 2016-03-23 MED ORDER — FENTANYL CITRATE (PF) 250 MCG/5ML IJ SOLN
INTRAMUSCULAR | Status: AC
  Filled 2016-03-23: qty 5

## 2016-03-23 MED ORDER — PROPOFOL 10 MG/ML IV BOLUS
INTRAVENOUS | Status: AC
  Filled 2016-03-23: qty 40

## 2016-03-23 MED ORDER — LIDOCAINE HCL (CARDIAC) 20 MG/ML IV SOLN
INTRAVENOUS | Status: DC | PRN
  Administered 2016-03-23: 40 mg via INTRAVENOUS

## 2016-03-23 MED ORDER — METOPROLOL SUCCINATE ER 50 MG PO TB24
50.0000 mg | ORAL_TABLET | Freq: Every day | ORAL | Status: DC
  Administered 2016-03-24: 50 mg via ORAL
  Filled 2016-03-23 (×2): qty 1

## 2016-03-23 MED ORDER — PROPOFOL 10 MG/ML IV BOLUS
INTRAVENOUS | Status: DC | PRN
  Administered 2016-03-23: 150 mg via INTRAVENOUS

## 2016-03-23 MED ORDER — ONDANSETRON HCL 4 MG/2ML IJ SOLN
4.0000 mg | Freq: Once | INTRAMUSCULAR | Status: AC
  Administered 2016-03-23: 4 mg via INTRAVENOUS

## 2016-03-23 MED ORDER — SODIUM CHLORIDE 0.9 % IV SOLN
1.0000 g | INTRAVENOUS | Status: AC
  Administered 2016-03-23: 1 g via INTRAVENOUS
  Filled 2016-03-23: qty 1

## 2016-03-23 MED ORDER — MIDAZOLAM HCL 2 MG/2ML IJ SOLN
INTRAMUSCULAR | Status: AC
  Filled 2016-03-23: qty 2

## 2016-03-23 MED ORDER — KETOROLAC TROMETHAMINE 30 MG/ML IJ SOLN
INTRAMUSCULAR | Status: AC
  Filled 2016-03-23: qty 1

## 2016-03-23 MED ORDER — BUPIVACAINE LIPOSOME 1.3 % IJ SUSP
INTRAMUSCULAR | Status: AC
  Filled 2016-03-23: qty 20

## 2016-03-23 MED ORDER — LIDOCAINE HCL (PF) 1 % IJ SOLN
INTRAMUSCULAR | Status: AC
  Filled 2016-03-23: qty 5

## 2016-03-23 MED ORDER — EPHEDRINE SULFATE 50 MG/ML IJ SOLN
INTRAMUSCULAR | Status: AC
  Filled 2016-03-23: qty 1

## 2016-03-23 MED ORDER — LORAZEPAM 2 MG/ML IJ SOLN: 1.0000 mg | INTRAMUSCULAR | Status: DC | PRN

## 2016-03-23 MED ORDER — ARTIFICIAL TEARS OP OINT
TOPICAL_OINTMENT | OPHTHALMIC | Status: DC | PRN
  Administered 2016-03-23: 1 via OPHTHALMIC

## 2016-03-23 MED ORDER — ALVIMOPAN 12 MG PO CAPS
12.0000 mg | ORAL_CAPSULE | Freq: Once | ORAL | Status: AC
  Administered 2016-03-23: 12 mg via ORAL
  Filled 2016-03-23: qty 1

## 2016-03-23 MED ORDER — PHENYLEPHRINE 40 MCG/ML (10ML) SYRINGE FOR IV PUSH (FOR BLOOD PRESSURE SUPPORT)
PREFILLED_SYRINGE | INTRAVENOUS | Status: AC
  Filled 2016-03-23: qty 10

## 2016-03-23 MED ORDER — HYDROMORPHONE HCL 1 MG/ML IJ SOLN
1.0000 mg | INTRAMUSCULAR | Status: DC | PRN
  Administered 2016-03-23 – 2016-03-25 (×7): 1 mg via INTRAVENOUS
  Filled 2016-03-23 (×7): qty 1

## 2016-03-23 MED ORDER — DIPHENHYDRAMINE HCL 25 MG PO CAPS: 25.0000 mg | ORAL_CAPSULE | Freq: Four times a day (QID) | ORAL | Status: DC | PRN

## 2016-03-23 MED ORDER — LACTATED RINGERS IV SOLN
INTRAVENOUS | Status: DC
  Administered 2016-03-23 (×4): via INTRAVENOUS

## 2016-03-23 MED ORDER — INSULIN ASPART 100 UNIT/ML ~~LOC~~ SOLN
0.0000 [IU] | Freq: Three times a day (TID) | SUBCUTANEOUS | Status: DC
  Administered 2016-03-24: 8 [IU] via SUBCUTANEOUS
  Administered 2016-03-24: 3 [IU] via SUBCUTANEOUS

## 2016-03-23 MED ORDER — OXYCODONE-ACETAMINOPHEN 5-325 MG PO TABS: 1.0000 | ORAL_TABLET | ORAL | Status: DC | PRN

## 2016-03-23 MED ORDER — KETOROLAC TROMETHAMINE 30 MG/ML IJ SOLN
30.0000 mg | Freq: Once | INTRAMUSCULAR | Status: AC
  Administered 2016-03-23: 30 mg via INTRAVENOUS

## 2016-03-23 MED ORDER — ENOXAPARIN SODIUM 40 MG/0.4ML ~~LOC~~ SOLN
40.0000 mg | SUBCUTANEOUS | Status: DC
  Administered 2016-03-24: 40 mg via SUBCUTANEOUS
  Filled 2016-03-23 (×2): qty 0.4

## 2016-03-23 MED ORDER — SUCCINYLCHOLINE CHLORIDE 20 MG/ML IJ SOLN
INTRAMUSCULAR | Status: AC
  Filled 2016-03-23: qty 1

## 2016-03-23 SURGICAL SUPPLY — 73 items
APPLIER CLIP 11 MED OPEN (CLIP)
APPLIER CLIP 13 LRG OPEN (CLIP)
BAG HAMPER (MISCELLANEOUS) ×3 IMPLANT
BARRIER SKIN 2 3/4 (OSTOMY) IMPLANT
BARRIER SKIN 2 3/4 INCH (OSTOMY)
CELLS DAT CNTRL 66122 CELL SVR (MISCELLANEOUS) IMPLANT
CHLORAPREP W/TINT 26ML (MISCELLANEOUS) ×3 IMPLANT
CLAMP POUCH DRAINAGE QUIET (OSTOMY) IMPLANT
CLIP APPLIE 11 MED OPEN (CLIP) IMPLANT
CLIP APPLIE 13 LRG OPEN (CLIP) IMPLANT
CLOTH BEACON ORANGE TIMEOUT ST (SAFETY) ×3 IMPLANT
COVER LIGHT HANDLE STERIS (MISCELLANEOUS) ×6 IMPLANT
COVER MAYO STAND XLG (DRAPE) ×3 IMPLANT
DRAPE UTILITY W/TAPE 26X15 (DRAPES) ×6 IMPLANT
DRAPE WARM FLUID 44X44 (DRAPE) ×3 IMPLANT
DRSG OPSITE POSTOP 4X10 (GAUZE/BANDAGES/DRESSINGS) IMPLANT
DRSG OPSITE POSTOP 4X8 (GAUZE/BANDAGES/DRESSINGS) ×3 IMPLANT
ELECT BLADE 6 FLAT ULTRCLN (ELECTRODE) IMPLANT
ELECT REM PT RETURN 9FT ADLT (ELECTROSURGICAL) ×3
ELECTRODE REM PT RTRN 9FT ADLT (ELECTROSURGICAL) ×1 IMPLANT
FORMALIN 10 PREFIL 480ML (MISCELLANEOUS) IMPLANT
GLOVE BIOGEL PI IND STRL 7.0 (GLOVE) ×1 IMPLANT
GLOVE BIOGEL PI INDICATOR 7.0 (GLOVE) ×2
GLOVE SURG SS PI 7.5 STRL IVOR (GLOVE) ×9 IMPLANT
GOWN STRL REUS W/ TWL XL LVL3 (GOWN DISPOSABLE) ×2 IMPLANT
GOWN STRL REUS W/TWL LRG LVL3 (GOWN DISPOSABLE) ×12 IMPLANT
GOWN STRL REUS W/TWL XL LVL3 (GOWN DISPOSABLE) ×4
HANDLE SUCTION POOLE (INSTRUMENTS) ×1 IMPLANT
INST SET MAJOR GENERAL (KITS) ×3 IMPLANT
KIT BLADEGUARD II DBL (SET/KITS/TRAYS/PACK) ×3 IMPLANT
KIT ROOM TURNOVER APOR (KITS) ×3 IMPLANT
LIGASURE IMPACT 36 18CM CVD LR (INSTRUMENTS) ×3 IMPLANT
MANIFOLD NEPTUNE II (INSTRUMENTS) ×3 IMPLANT
NEEDLE HYPO 18GX1.5 BLUNT FILL (NEEDLE) ×3 IMPLANT
NEEDLE HYPO 21X1.5 SAFETY (NEEDLE) ×3 IMPLANT
NS IRRIG 1000ML POUR BTL (IV SOLUTION) ×6 IMPLANT
PACK ABDOMINAL MAJOR (CUSTOM PROCEDURE TRAY) ×3 IMPLANT
PAD ARMBOARD 7.5X6 YLW CONV (MISCELLANEOUS) ×3 IMPLANT
PENCIL HANDSWITCHING (ELECTRODE) ×3 IMPLANT
POUCH OSTOMY 2 3/4  H 3804 (WOUND CARE)
POUCH OSTOMY 2 PC DRNBL 2.25 (WOUND CARE) IMPLANT
POUCH OSTOMY 2 PC DRNBL 2.75 (WOUND CARE) IMPLANT
POUCH OSTOMY DRNBL 2 1/4 (WOUND CARE)
RELOAD LINEAR CUT PROX 55 BLUE (ENDOMECHANICALS) IMPLANT
RELOAD PROXIMATE 75MM BLUE (ENDOMECHANICALS) ×3 IMPLANT
RETRACTOR WND ALEXIS 25 LRG (MISCELLANEOUS) ×1 IMPLANT
RTRCTR WOUND ALEXIS 18CM MED (MISCELLANEOUS)
RTRCTR WOUND ALEXIS 25CM LRG (MISCELLANEOUS) ×3
SEALER TISSUE X1 CVD JAW (INSTRUMENTS) ×3 IMPLANT
SET BASIN LINEN APH (SET/KITS/TRAYS/PACK) ×3 IMPLANT
SPONGE LAP 18X18 X RAY DECT (DISPOSABLE) ×3 IMPLANT
STAPLER CUT CVD 40MM GREEN (STAPLE) ×3 IMPLANT
STAPLER ENDO ILS CVD 18 33 (STAPLE) ×3 IMPLANT
STAPLER GUN LINEAR PROX 60 (STAPLE) ×3 IMPLANT
STAPLER PROXIMATE 55 BLUE (STAPLE) IMPLANT
STAPLER PROXIMATE 75MM BLUE (STAPLE) ×3 IMPLANT
STAPLER SYS INTERNAL RELOAD SS (MISCELLANEOUS) ×3 IMPLANT
STAPLER VISISTAT (STAPLE) ×3 IMPLANT
SUCTION POOLE HANDLE (INSTRUMENTS) ×3
SUCTION YANKAUER HANDLE (MISCELLANEOUS) ×3 IMPLANT
SUT CHROMIC 0 SH (SUTURE) IMPLANT
SUT CHROMIC 2 0 SH (SUTURE) IMPLANT
SUT CHROMIC 3 0 SH 27 (SUTURE) ×3 IMPLANT
SUT NOVA NAB GS-26 0 60 (SUTURE) IMPLANT
SUT PDS AB 0 CTX 60 (SUTURE) IMPLANT
SUT SILK 2 0 (SUTURE) ×2
SUT SILK 2 0 REEL (SUTURE) ×3 IMPLANT
SUT SILK 2-0 18XBRD TIE 12 (SUTURE) ×1 IMPLANT
SUT SILK 3 0 SH CR/8 (SUTURE) ×3 IMPLANT
SYR 20CC LL (SYRINGE) ×3 IMPLANT
TOWEL BLUE STERILE X RAY DET (MISCELLANEOUS) IMPLANT
TOWEL OR 17X26 4PK STRL BLUE (TOWEL DISPOSABLE) ×3 IMPLANT
TRAY FOLEY CATH SILVER 16FR (SET/KITS/TRAYS/PACK) ×3 IMPLANT

## 2016-03-23 NOTE — Anesthesia Postprocedure Evaluation (Signed)
Anesthesia Post Note  Patient: Troy Smith  Procedure(s) Performed: Procedure(s) (LRB): PARTIAL COLECTOMY Low anterior resection (N/A)  Patient location during evaluation: PACU Anesthesia Type: General Level of consciousness: awake and alert and oriented Pain management: pain level controlled Vital Signs Assessment: post-procedure vital signs reviewed and stable Respiratory status: patient connected to face mask oxygen Cardiovascular status: stable Postop Assessment: no signs of nausea or vomiting Anesthetic complications: no     Last Vitals:  Vitals:   03/23/16 0706 03/23/16 0945  BP: 119/84 121/78  Pulse: 76   Resp: 18   Temp: 36.7 C 36.9 C    Last Pain:  Vitals:   03/23/16 0945  TempSrc:   PainSc: 4                  ADAMS, AMY A

## 2016-03-23 NOTE — Op Note (Signed)
Patient:  Troy Smith  DOB:  November 03, 1955  MRN:  [id number]   Preop Diagnosis:  Colon neoplasm  Postop Diagnosis:  Same  Procedure:  Low anterior resection with transanal anastomosis  Surgeon:  Aviva Signs, M.D.  Anes:  Gen. endotracheal  Indications:  Patient is a 61 year old white male who was recently found on colonoscopy to have a near obstructing sigmoid colon lesion. The patient now comes to the operating room for a partial colectomy. The risks and benefits of the procedure including bleeding, infection, cardiopulmonary difficulties, the possibility of a blood transfusion, and the possibility of an anastomotic leak were fully explained to the patient, who gave informed consent.  Procedure note:  The patient was placed in the low lithotomy position after induction of general endotracheal anesthesia. The abdomen was prepped with ChloraPrep and the perineum was prepped using Betadine. Surgical site confirmation was performed.  A midline incision was made at the umbilicus to suprapubic region. The peritoneal cavity was entered into without difficulty. The liver was palpated and noted to be within normal limits. No evidence of metastatic disease. The descending colon and sigmoid colon were mobilized along the peritoneal reflection. Care was taken to avoid the left ureter. The patient was noted to have a distal sigmoid colon mass which was adherent to the right pelvic wall. This was freed away sharply and bluntly. The peritoneal reflection on either side of the distal sigmoid colon and rectum was incised. A GIA stapler was placed across the mid sigmoid colon and fired. A TA stapler was placed high on the rectum and fired. The mesentery was then divided using the LigaSure. Any large vessels were ligated using 2-0 silk ties. A suture was placed proximally for orientation purposes. The specimen was then opened in the operating room and the distal margin was noted to be greater than 1 cm. The  specimen was then sent to pathology for examination. Then, a #33 circular EEA anastomosis was performed. The anvil was placed in the proximal colon and secured using a pursestring suture. The base was then passed transanally up. The anvil was then connected to the base and secured. The EEA circular stapler fired without difficulty. 2 well-formed donuts were noted. These were sent to pathology for examination. There was then instilled transanally and no anastomotic leak was found. The anastomosis was oversewn using 3-0 silk sutures. Gelfoam and Surgicel were placed along the sacrum posteriorly. The pelvis was then copiously irrigated with normal saline. Prior to closure, all operating room personnel changed her gown and gloves. A new setup was used.  The fascia was reapproximated using a looped 0 PDS running suture. The subcutaneous layer was irrigated with normal saline. Exparel was instilled into the surrounding wound. The skin was closed using staples. Betadine ointment and dry sterile dressings were applied.  All tape and needle counts were correct at the end of the procedure. The patient was extubated in the operating room and transferred to PACU in stable condition.  Complications:  None  EBL:  200 mL  Specimen:  Sigmoid colon, suture proximal

## 2016-03-23 NOTE — Anesthesia Procedure Notes (Signed)
Procedure Name: Intubation Date/Time: 03/23/2016 7:32 AM Performed by: Andree Elk, Argenis Kumari A Pre-anesthesia Checklist: Patient identified, Patient being monitored, Timeout performed, Emergency Drugs available and Suction available Patient Re-evaluated:Patient Re-evaluated prior to inductionOxygen Delivery Method: Circle System Utilized Preoxygenation: Pre-oxygenation with 100% oxygen Intubation Type: IV induction Ventilation: Mask ventilation without difficulty Laryngoscope Size: Miller and 3 Grade View: Grade I Tube type: Oral Tube size: 7.0 mm Number of attempts: 1 Airway Equipment and Method: Stylet Placement Confirmation: ETT inserted through vocal cords under direct vision,  positive ETCO2 and breath sounds checked- equal and bilateral Secured at: 21 cm Tube secured with: Tape Dental Injury: Teeth and Oropharynx as per pre-operative assessment

## 2016-03-23 NOTE — Progress Notes (Signed)
Patient seen in PACU earlier today. Operative findings noted. CEA is mildly elevated 13.2. Biopsy results reviewed with Dr. Tillman Sers of pathology service. Biopsy reveals adenocarcinoma.

## 2016-03-23 NOTE — Transfer of Care (Signed)
Immediate Anesthesia Transfer of Care Note  Patient: Olean Ree  Procedure(s) Performed: Procedure(s): PARTIAL COLECTOMY Low anterior resection (N/A)  Patient Location: PACU  Anesthesia Type:General  Level of Consciousness: awake, oriented and patient cooperative  Airway & Oxygen Therapy: Patient Spontanous Breathing and Patient connected to face mask oxygen  Post-op Assessment: Report given to RN and Post -op Vital signs reviewed and stable  Post vital signs: Reviewed and stable  Last Vitals:  Vitals:   03/23/16 0706  BP: 119/84  Pulse: 76  Resp: 18  Temp: 36.7 C    Last Pain:  Vitals:   03/23/16 0706  TempSrc: Oral      Patients Stated Pain Goal: 4 ([id number])  Complications: No apparent anesthesia complications

## 2016-03-23 NOTE — Interval H&P Note (Signed)
History and Physical Interval Note:  03/23/2016 7:13 AM  Troy Smith  has presented today for surgery, with the diagnosis of Colon Neoplasm  The various methods of treatment have been discussed with the patient and family. After consideration of risks, benefits and other options for treatment, the patient has consented to  Procedure(s): PARTIAL COLECTOMY (N/A) as a surgical intervention .  The patient's history has been reviewed, patient examined, no change in status, stable for surgery.  I have reviewed the patient's chart and labs.  Questions were answered to the patient's satisfaction.     Aviva Signs A

## 2016-03-23 NOTE — Anesthesia Preprocedure Evaluation (Signed)
Anesthesia Evaluation  Patient identified by MRN, date of birth, ID band Patient awake    Reviewed: Allergy & Precautions, NPO status , Patient's Chart, lab work & pertinent test results  Airway Mallampati: II  TM Distance: >3 FB     Dental  (+) Teeth Intact   Pulmonary neg pulmonary ROS,    breath sounds clear to auscultation       Cardiovascular hypertension, Pt. on medications  Rhythm:Regular Rate:Normal     Neuro/Psych  Neuromuscular disease    GI/Hepatic negative GI ROS,   Endo/Other  diabetes, Type 2, Oral Hypoglycemic Agents, Insulin Dependent  Renal/GU      Musculoskeletal   Abdominal   Peds  Hematology   Anesthesia Other Findings   Reproductive/Obstetrics                             Anesthesia Physical Anesthesia Plan  ASA: III  Anesthesia Plan: General   Post-op Pain Management:    Induction: Intravenous  Airway Management Planned: Oral ETT  Additional Equipment:   Intra-op Plan:   Post-operative Plan: Extubation in OR  Informed Consent: I have reviewed the patients History and Physical, chart, labs and discussed the procedure including the risks, benefits and alternatives for the proposed anesthesia with the patient or authorized representative who has indicated his/her understanding and acceptance.     Plan Discussed with:   Anesthesia Plan Comments:         Anesthesia Quick Evaluation

## 2016-03-24 ENCOUNTER — Encounter (HOSPITAL_COMMUNITY): Payer: Self-pay | Admitting: General Surgery

## 2016-03-24 LAB — BASIC METABOLIC PANEL
Anion gap: 7 (ref 5–15)
BUN: 9 mg/dL (ref 6–20)
CO2: 31 mmol/L (ref 22–32)
Calcium: 8.5 mg/dL — ABNORMAL LOW (ref 8.9–10.3)
Chloride: 99 mmol/L — ABNORMAL LOW (ref 101–111)
Creatinine, Ser: 0.98 mg/dL (ref 0.61–1.24)
GFR calc Af Amer: >60 mL/min (ref >60)
GFR calc non Af Amer: >60 mL/min (ref >60)
Glucose, Bld: 125 mg/dL — ABNORMAL HIGH (ref 65–99)
Potassium: 3.9 mmol/L (ref 3.5–5.1)
Sodium: 137 mmol/L (ref 135–145)

## 2016-03-24 LAB — CBC
HCT: 30.6 % — ABNORMAL LOW (ref 39.0–52.0)
Hemoglobin: 10.2 g/dL — ABNORMAL LOW (ref 13.0–17.0)
MCH: 26.7 pg (ref 26.0–34.0)
MCHC: 33.3 g/dL (ref 30.0–36.0)
MCV: 80.1 fL (ref 78.0–100.0)
Platelets: 192 10*3/uL (ref 150–400)
RBC: 3.82 MIL/uL — ABNORMAL LOW (ref 4.22–5.81)
RDW: 14.5 % (ref 11.5–15.5)
WBC: 7.9 10*3/uL (ref 4.0–10.5)

## 2016-03-24 LAB — GLUCOSE, CAPILLARY
GLUCOSE-CAPILLARY: 174 mg/dL — AB (ref 65–99)
Glucose-Capillary: 116 mg/dL — ABNORMAL HIGH (ref 65–99)
Glucose-Capillary: 157 mg/dL — ABNORMAL HIGH (ref 65–99)
Glucose-Capillary: 259 mg/dL — ABNORMAL HIGH (ref 65–99)

## 2016-03-24 LAB — PHOSPHORUS: Phosphorus: 3 mg/dL (ref 2.5–4.6)

## 2016-03-24 LAB — MAGNESIUM: Magnesium: 1.4 mg/dL — ABNORMAL LOW (ref 1.7–2.4)

## 2016-03-24 MED ORDER — MAGNESIUM SULFATE 4 GM/100ML IV SOLN
4.0000 g | Freq: Once | INTRAVENOUS | Status: AC
  Administered 2016-03-24: 4 g via INTRAVENOUS
  Filled 2016-03-24: qty 100

## 2016-03-24 MED ORDER — BOOST / RESOURCE BREEZE PO LIQD
1.0000 | Freq: Three times a day (TID) | ORAL | Status: DC
  Administered 2016-03-24 (×2): 1 via ORAL

## 2016-03-24 NOTE — Addendum Note (Signed)
Addendum  created 03/24/16 1102 by Mickel Baas, CRNA   Sign clinical note

## 2016-03-24 NOTE — Progress Notes (Signed)
Initial Nutrition Assessment  INTERVENTION:  Boost Breeze po TID, each supplement provides 250 kcal and 9 grams of protein   When diet is advanced fully will review intake and re-assess whether additional supplements are needed   NUTRITION DIAGNOSIS:   Inadequate oral intake related to inability to eat, altered GI function as evidenced by per patient/family report, pt is s/p partial colectomy 2/12.   GOAL:   Patient will meet greater than or equal to 90% of their needs   MONITOR:   Supplement acceptance, Diet advancement, Weight trends, PO intake  REASON FOR ASSESSMENT:   Malnutrition Screening Tool    ASSESSMENT: Patient hx is significant for diverticulitis, unplanned wt loss and sigmoid colon neoplasm. He underwent partial colectomy yesterday. He is progressing well per MD and diet is advancing.   The patient says he intake has been very limited especially the past 3 weeks. His weight has decreased since October (5%) but is not significant for the timeframe. The patient has been consuming primarily liquids such as juices, broth and milkshakes. His protein intake has been inadequate based on needs and his diet hx.  He is agreeable to drink supplements while his diet is being advanced. We trialed the [facility] and he likes the flavor. We talked about helping him  Increase his protein intake especially given his poor intake prior to surgery and increased needs for healing presently.   Nutrition focused exam findings: unremarkable    Recent Labs Lab 03/20/16 0842 03/23/16 0646 03/24/16 0623  NA 139 140 137  K 3.2* 3.1* 3.9  CL 103  --  99*  CO2 28  --  31  BUN 12  --  9  CREATININE 0.98  --  0.98  CALCIUM 8.8*  --  8.5*  MG  --   --  1.4*  PHOS  --   --  3.0  GLUCOSE 128* 102* 125*   Labs: Mag 1.4 (repleted)   Meds: Novolog  Diet Order:  Diet full liquid Room service appropriate? Yes; Fluid consistency: Thin  Skin:   surgical incision   Last BM:  2/13 smear    Height:   Ht Readings from Last 1 Encounters:  03/20/16 5\' 11"  (1.803 m)    Weight:   Wt Readings from Last 1 Encounters:  03/20/16 224 lb (101.6 kg)    Ideal Body Weight:  78 kg  BMI:  There is no height or weight on file to calculate BMI.  Estimated Nutritional Needs:   Kcal:  2000-2225   Protein:  117-122 gr  Fluid:  2.0-2.2 liters daily  EDUCATION NEEDS:   No education needs identified at this time  Colman Cater MS,RD,CSG,LDN Office: [phone] Pager: [phone]

## 2016-03-24 NOTE — Progress Notes (Signed)
1 Day Post-Op  Subjective: Patient progressing well. Minimal incisional pain. Foley catheter is out. He has voided. He has had small smears of stool.  Objective: Vital signs in last 24 hours: Temp:  [97.9 F (36.6 C)-98.5 F (36.9 C)] 98.4 F (36.9 C) (02/13 0425) Pulse Rate:  [75-91] 77 (02/13 0425) Resp:  [10-20] 18 (02/13 0425) BP: (101-121)/(58-78) 109/58 (02/13 0425) SpO2:  [92 %-100 %] 100 % (02/13 0425) Last BM Date: 03/24/16  Intake/Output from previous day: 02/12 0701 - 02/13 0700 In: 6190.7 [P.O.:504; I.V.:5636.7] Out: 875 [Urine:625; Blood:250] Intake/Output this shift: No intake/output data recorded.  General appearance: alert, cooperative and no distress Resp: clear to auscultation bilaterally Cardio: regular rate and rhythm, S1, S2 normal, no murmur, click, rub or gallop GI: Soft, incision healing well.  Lab Results:   Recent Labs  03/23/16 1000 03/24/16 0623  WBC  --  7.9  HGB 11.1* 10.2*  HCT 32.7* 30.6*  PLT  --  192   BMET  Recent Labs  03/23/16 0646 03/24/16 0623  NA 140 137  K 3.1* 3.9  CL  --  99*  CO2  --  31  GLUCOSE 102* 125*  BUN  --  9  CREATININE  --  0.98  CALCIUM  --  8.5*   PT/INR No results for input(s): LABPROT, INR in the last 72 hours.  Studies/Results: No results found.  Anti-infectives: Anti-infectives    Start     Dose/Rate Route Frequency Ordered Stop   03/23/16 0622  ertapenem [facility]) 1 g in sodium chloride 0.9 % 50 mL IVPB     1 g 100 mL/hr over 30 Minutes Intravenous On call to O.R. 03/23/16 0622 03/23/16 0803      Assessment/Plan: s/p Procedure(s): PARTIAL COLECTOMY Low anterior resection Impression: Stable on postoperative day 1. Final pathology pending. Hypomagnesemia will be addressed. We will adjust IV fluids. Will advance diet as tolerated.  LOS: 1 day    Sofi Bryars A 03/24/2016

## 2016-03-24 NOTE — Anesthesia Postprocedure Evaluation (Signed)
Anesthesia Post Note  Patient: KASSIUS FETHEROLF  Procedure(s) Performed: Procedure(s) (LRB): LOW ANTERIOR RESECTION (N/A)  Patient location during evaluation: Nursing Unit Anesthesia Type: General Level of consciousness: awake and alert and oriented Pain management: pain level controlled Vital Signs Assessment: post-procedure vital signs reviewed and stable Respiratory status: spontaneous breathing Cardiovascular status: stable Postop Assessment: no signs of nausea or vomiting Anesthetic complications: no     Last Vitals:  Vitals:   03/23/16 2115 03/24/16 0425  BP: 115/62 (!) 109/58  Pulse: 82 77  Resp: 18 18  Temp: 36.6 C 36.9 C    Last Pain:  Vitals:   03/24/16 0936  TempSrc:   PainSc: 2                  Jazlin Tapscott A

## 2016-03-25 LAB — BASIC METABOLIC PANEL
Anion gap: 8 (ref 5–15)
BUN: 8 mg/dL (ref 6–20)
CALCIUM: 8.7 mg/dL — AB (ref 8.9–10.3)
CO2: 30 mmol/L (ref 22–32)
CREATININE: 0.94 mg/dL (ref 0.61–1.24)
Chloride: 96 mmol/L — ABNORMAL LOW (ref 101–111)
GFR calc Af Amer: >60 mL/min (ref >60)
Glucose, Bld: 177 mg/dL — ABNORMAL HIGH (ref 65–99)
Potassium: 3.6 mmol/L (ref 3.5–5.1)
SODIUM: 134 mmol/L — AB (ref 135–145)

## 2016-03-25 LAB — CBC
HCT: 29.9 % — ABNORMAL LOW (ref 39.0–52.0)
Hemoglobin: 9.9 g/dL — ABNORMAL LOW (ref 13.0–17.0)
MCH: 26.5 pg (ref 26.0–34.0)
MCHC: 33.1 g/dL (ref 30.0–36.0)
MCV: 79.9 fL (ref 78.0–100.0)
PLATELETS: 224 10*3/uL (ref 150–400)
RBC: 3.74 MIL/uL — AB (ref 4.22–5.81)
RDW: 14.6 % (ref 11.5–15.5)
WBC: 9.3 10*3/uL (ref 4.0–10.5)

## 2016-03-25 LAB — MAGNESIUM: MAGNESIUM: 1.9 mg/dL (ref 1.7–2.4)

## 2016-03-25 LAB — GLUCOSE, CAPILLARY
Glucose-Capillary: 165 mg/dL — ABNORMAL HIGH (ref 65–99)
Glucose-Capillary: 195 mg/dL — ABNORMAL HIGH (ref 65–99)

## 2016-03-25 LAB — PHOSPHORUS: Phosphorus: 2.8 mg/dL (ref 2.5–4.6)

## 2016-03-25 MED ORDER — OXYCODONE-ACETAMINOPHEN 5-325 MG PO TABS: 1.0000 | ORAL_TABLET | ORAL | 0 refills | Status: DC | PRN

## 2016-03-25 NOTE — Progress Notes (Addendum)
Patient discharged with instructions, prescription, and care notes.  Verbalized understanding via teach back.  IV was removed and the site was WNL. Patient voiced no further complaints or concerns at the time of discharge.  Appointments scheduled per instructions.  Patient left the floor via w/c family  And staff in stable condition.    Incision cleansed prior to discharge.   Patient ambulated approx 200 feet.  He only complained of knee pain.  Otherwise tolerated the activity without difficulty.

## 2016-03-25 NOTE — Discharge Summary (Signed)
Physician Discharge Summary  Patient ID: Troy Smith MRN: [id number] DOB/AGE: 1955/02/28 61 y.o.  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 His post-operative recovery during this admission was uneventful, and he recovered bowel function with his pain well-controlled and has been able to ambulate without difficulty. Discharge planning was accordingly initiated, and patient is safe to be discharged home with appropriate follow-up and instructions.  Consults: None  Significant Diagnostic Studies: labs: WBC (03/24/2016) - 9.3, Hb/Hct (03/24/2016) - 9.9/29.9  Treatments: surgery: Open Low Anterior Resection with primary stapled trans-anal anastomosis (Dr. Aviva Signs, 03/23/2016)  Discharge Exam: Blood pressure 116/66, pulse 87, temperature 98 F (36.7 C), temperature source Oral, resp. rate 17, SpO2 100 %. General appearance: alert, cooperative and no distress GI: soft, non-tender; non-distended, incision well-approximated without erythema or drainage  Disposition: 01-Home or Self Care   Allergies as of 03/25/2016   No Known Allergies     Medication List    TAKE these medications   amLODipine 10 MG tablet Commonly known as:  NORVASC Take 10 mg by mouth daily.   hydrochlorothiazide 25 MG tablet Commonly known as:  HYDRODIURIL Take 25 mg by mouth daily.   insulin glargine 100 UNIT/ML injection Commonly known as:  LANTUS Inject 30 Units into the skin at bedtime.   metoprolol succinate 50 MG 24 hr tablet Commonly known as:  TOPROL-XL Take 50 mg by mouth daily. Take with or immediately following a meal.    oxyCODONE-acetaminophen 5-325 MG tablet Commonly known as:  PERCOCET/ROXICET Take 1-2 tablets by mouth every 4 (four) hours as needed for moderate pain.   potassium chloride SA 20 MEQ tablet Commonly known as:  K-DUR,KLOR-CON Take 1 tablet (20 mEq total) by mouth daily.      Follow-up Information    Jamesetta So, MD. Schedule an appointment as soon as possible for a visit on 03/31/2016.   Specialty:  General Surgery Why:  Please call to schedule surgical follow-up appointment for next Tuesday, 2/20. Contact information: [address] [id number] [phone]           Signed: Vickie Epley 03/25/2016, 11:00 AM

## 2016-03-25 NOTE — Discharge Instructions (Signed)
In addition to included general post-operative instructions for Open Colectomy,  Diet: Gradually resume home heart healthy diet (start with soft, low fiber foods and advance as tolerated, making sure to maintain adequate hydration).  Activity: No heavy lifting >20 pounds (children, pets, laundry, garbage) or strenuous activity until follow-up, but light activity and walking are encouraged. Do not drive or drink alcohol if taking narcotic pain medications.  Wound care: Dressing to be removed 2 days after surgery (prior to discharge home from [place]). Okay to shower/get incision wet with soapy water and pat dry (do not rub incisions), but no baths or submerging incision underwater until follow-up.  Medications: Resume all home medications. For mild to moderate pain: acetaminophen (Tylenol) or ibuprofen (if no kidney disease). Narcotic pain medications, if prescribed, can be used for severe pain, though may cause nausea, constipation, and drowsiness. Do not combine Tylenol and Percocet within a 6 hour period as Percocet contains Tylenol. If you do not need the narcotic pain medication, you do not need to fill the prescription.  Call office [phone]) at any time if any questions, worsening pain, fevers/chills, bleeding, drainage from incision site, or other concerns.

## 2016-03-26 ENCOUNTER — Encounter (HOSPITAL_COMMUNITY): Payer: Self-pay | Admitting: Internal Medicine

## 2016-04-01 ENCOUNTER — Encounter (HOSPITAL_COMMUNITY): Payer: Self-pay | Admitting: Oncology

## 2016-04-08 ENCOUNTER — Encounter (HOSPITAL_COMMUNITY): Payer: 59 | Attending: Oncology | Admitting: Oncology

## 2016-04-08 ENCOUNTER — Encounter (HOSPITAL_COMMUNITY): Payer: 59

## 2016-04-08 ENCOUNTER — Encounter (HOSPITAL_COMMUNITY): Payer: Self-pay | Admitting: Oncology

## 2016-04-08 VITALS — BP 133/83 | HR 93 | Temp 99.2°F | Resp 16 | Ht 71.0 in | Wt 216.5 lb

## 2016-04-08 DIAGNOSIS — C187 Malignant neoplasm of sigmoid colon: Secondary | ICD-10-CM | POA: Diagnosis present

## 2016-04-08 DIAGNOSIS — E119 Type 2 diabetes mellitus without complications: Secondary | ICD-10-CM

## 2016-04-08 DIAGNOSIS — I10 Essential (primary) hypertension: Secondary | ICD-10-CM | POA: Diagnosis not present

## 2016-04-08 LAB — CBC WITH DIFFERENTIAL/PLATELET
Basophils Absolute: 0 10*3/uL (ref 0.0–0.1)
Basophils Relative: 0 %
Eosinophils Absolute: 0.1 10*3/uL (ref 0.0–0.7)
Eosinophils Relative: 1 %
HEMATOCRIT: 33.2 % — AB (ref 39.0–52.0)
HEMOGLOBIN: 10.9 g/dL — AB (ref 13.0–17.0)
LYMPHS PCT: 11 %
Lymphs Abs: 1.6 10*3/uL (ref 0.7–4.0)
MCH: 26 pg (ref 26.0–34.0)
MCHC: 32.8 g/dL (ref 30.0–36.0)
MCV: 79 fL (ref 78.0–100.0)
MONO ABS: 1.2 10*3/uL — AB (ref 0.1–1.0)
Monocytes Relative: 8 %
NEUTROS ABS: 12.1 10*3/uL — AB (ref 1.7–7.7)
NEUTROS PCT: 80 %
Platelets: 300 10*3/uL (ref 150–400)
RBC: 4.2 MIL/uL — ABNORMAL LOW (ref 4.22–5.81)
RDW: 14.3 % (ref 11.5–15.5)
WBC: 15 10*3/uL — ABNORMAL HIGH (ref 4.0–10.5)

## 2016-04-08 LAB — COMPREHENSIVE METABOLIC PANEL
ALBUMIN: 3.5 g/dL (ref 3.5–5.0)
ALK PHOS: 70 U/L (ref 38–126)
ALT: 15 U/L — ABNORMAL LOW (ref 17–63)
AST: 15 U/L (ref 15–41)
Anion gap: 11 (ref 5–15)
BILIRUBIN TOTAL: 0.6 mg/dL (ref 0.3–1.2)
BUN: 23 mg/dL — AB (ref 6–20)
CALCIUM: 10.2 mg/dL (ref 8.9–10.3)
CO2: 28 mmol/L (ref 22–32)
Chloride: 93 mmol/L — ABNORMAL LOW (ref 101–111)
Creatinine, Ser: 1.39 mg/dL — ABNORMAL HIGH (ref 0.61–1.24)
GFR calc Af Amer: >60 mL/min (ref >60)
GFR, EST NON AFRICAN AMERICAN: 54 mL/min — AB (ref >60)
GLUCOSE: 183 mg/dL — AB (ref 65–99)
POTASSIUM: 3.6 mmol/L (ref 3.5–5.1)
Sodium: 132 mmol/L — ABNORMAL LOW (ref 135–145)
TOTAL PROTEIN: 7.5 g/dL (ref 6.5–8.1)

## 2016-04-08 NOTE — Patient Instructions (Addendum)
[place] at [facility] Discharge Instructions  RECOMMENDATIONS MADE BY THE CONSULTANT AND ANY TEST RESULTS WILL BE SENT TO YOUR REFERRING PHYSICIAN.  You were seen today by Kirby Crigler PA-C. Labs today, we will call you with results. Return in 3 months for labs, CT and follow up.    Thank you for choosing [place] at [facility] to provide your oncology and hematology care.  To afford each patient quality time with our provider, please arrive at least 15 minutes before your scheduled appointment time.    If you have a lab appointment with the [facility] please come in thru the  Main Entrance and check in at the main information desk  You need to re-schedule your appointment should you arrive 10 or more minutes late.  We strive to give you quality time with our providers, and arriving late affects you and other patients whose appointments are after yours.  Also, if you no show three or more times for appointments you may be dismissed from the clinic at the providers discretion.     Again, thank you for choosing [facility].  Our hope is that these requests will decrease the amount of time that you wait before being seen by our physicians.       _____________________________________________________________  Should you have questions after your visit to [facility], please contact our office at [phone] between the hours of 8:30 a.m. and 4:30 p.m.  Voicemails left after 4:30 p.m. will not be returned until the following business day.  For prescription refill requests, have your pharmacy contact our office.       Resources For Cancer Patients and their Caregivers ? American Cancer Society: Can assist with transportation, wigs, general needs, runs Look Good Feel Better.        570-822-8702 ? Cancer Care: Provides financial assistance, online support groups, medication/co-pay assistance.  1-800-813-HOPE  405-225-6695) ? [place] Assists [place] cancer patients and their families through emotional , educational and financial support.  412-057-1298 ? [place] DSS Where to apply for food stamps, Medicaid and utility assistance. (867) 435-4305 ? [facility]: Transportation to medical appointments. (720) 709-0172 ? Social Security Administration: May apply for disability if have a Stage IV cancer. (438)007-2328 947-860-1794 ? [employer], Disability and Transit Services: Assists with nutrition, care and transit needs. [place] Support Programs: @10RELATIVEDAYS @ > Cancer Support Group  2nd Tuesday of the month 1pm-2pm, Journey Room  > Creative Journey  3rd Tuesday of the month 1130am-1pm, Journey Room  > Look Good Feel Better  1st Wednesday of the month 10am-12 noon, Journey Room (Call [place] to register [phone])

## 2016-04-08 NOTE — Progress Notes (Signed)
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 Arnoldo Morale on 03/23/2016 with an elevated pre-operative CEA at 13.2.  HISTORY OF PRESENT ILLNESS:   Troy Smith is a 61 y.o. 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 Arnoldo Morale on 03/23/2016 with an elevated pre-operative CEA at 13.2.  He reports that he was seen by GI for abdominal discomfort.  He notes that CT imaging was performed.  Following CT scan, he was diagnosed with diverticulitis and started on antibiotics.  Antibiotics were completed and symptoms did not improve.  This resulted in the patient representing back to GI.  Saw Dr. [place] who recommended a colonoscopy.  Colonoscopy was performed on 03/20/2016 which demonstrated a fungating, polypoid and ulcerated partially obstructing large mass in the distal sigmoid mass was circumferential with high-grade stricture.  Mass measures 5 cm in length.  Oozing was present.  Mucosa was biopsied with cold forceps for histology.  Impression of this exam reads "malignant partially obstructing tumor in the distal colon, biopsied.  Incomplete exam."  He saw Dr. Arnoldo Morale who performed a LAR on 03/23/2016.  Pre-operative CEA was elevated at 13.2.  He is here today for consideration of adjuvant chemotherapy.  He is tolerated the procedure well without any issues.  He denies any fevers or chills.  He denies any drainage from the surgical site.   He notes that it is nearly healed completely.  He denies any issues with his bowels.  He denies any blood in his stools or black stools.  I reviewed the NCCN guidelines pertaining to treatment in the Stage II  setting of colorectal cancer.  I printed the guidelines as well for him to review.   Review of Systems  Constitutional: Negative.  Negative for chills, fever and weight loss.  HENT: Negative.   Eyes: Negative.   Respiratory: Negative.  Negative for cough.   Cardiovascular: Negative.  Negative for chest pain.  Gastrointestinal: Negative.  Negative for blood in stool, constipation, diarrhea, melena, nausea and vomiting.  Genitourinary: Negative.   Musculoskeletal: Negative.   Skin: Negative.   Neurological: Negative.  Negative for weakness.  Endo/Heme/Allergies: Negative.   Psychiatric/Behavioral: Negative.      PAST MEDICAL HISTORY:   Past Medical History:  Diagnosis Date  . Adenocarcinoma of sigmoid colon ([place]) 03/23/2016  . Carpal tunnel syndrome 05/02/2014   Bilateral  . Cervical disc disorder with radiculopathy of cervical region 05/02/2014   Left C8  . Diabetes mellitus without complication ([place])   . Hypertension     ALLERGIES: No Known Allergies    MEDICATIONS: I have reviewed the patient's current medications.    Current Outpatient Prescriptions on File Prior to Visit  Medication Sig Dispense Refill  . amLODipine (NORVASC) 10 MG tablet Take 10 mg by mouth daily.    . hydrochlorothiazide (HYDRODIURIL) 25 MG tablet Take 25 mg by mouth daily.     . insulin glargine (LANTUS) 100 UNIT/ML injection Inject  30 Units into the skin at bedtime.     . metoprolol succinate (TOPROL-XL) 50 MG 24 hr tablet Take 50 mg by mouth daily. Take with or immediately following a meal.    . oxyCODONE-acetaminophen (PERCOCET/ROXICET) 5-325 MG tablet Take 1-2 tablets by mouth every 4 (four) hours as needed for moderate pain. (Patient not taking: Reported on 04/08/2016) 30 tablet 0   No  current facility-administered medications on file prior to visit.      PAST SURGICAL HISTORY Past Surgical History:  Procedure Laterality Date  . BIOPSY  03/20/2016   Procedure: BIOPSY;  Surgeon: Rogene Houston, MD;  Location: [facility];  Service: Endoscopy;;  colon  . BOWEL RESECTION N/A 03/23/2016   Procedure: LOW ANTERIOR RESECTION;  Surgeon: Aviva Signs, MD;  Location: [facility] ORS;  Service: General;  Laterality: N/A;  . COLONOSCOPY N/A 03/20/2016   Procedure: COLONOSCOPY;  Surgeon: Rogene Houston, MD;  Location: [facility];  Service: Endoscopy;  Laterality: N/A;  730  . NO PAST SURGERIES      FAMILY HISTORY: Family History  Problem Relation Age of Onset  . COPD Mother   . Melanoma Father    Mother deceased at the age of 78 due to complications of COPD Father deceased at the age of 39 due to metastatic melanoma.  SOCIAL HISTORY:  reports that he has never smoked. He has never used smokeless tobacco. He reports that he does not drink alcohol or use drugs.  He is married x 33 years.  He does not have children.  He is a Public librarian.  He is [employer].  Social History   Social History  . Marital status: Married    Spouse name: N/A  . Number of children: N/A  . Years of education: N/A   Social History Main Topics  . Smoking status: Never Smoker  . Smokeless tobacco: Never Used  . Alcohol use No  . Drug use: No  . Sexual activity: Not Asked   Other Topics Concern  . None   Social History Narrative  . None    PERFORMANCE STATUS: The patient's performance status is 0 - Asymptomatic  PHYSICAL EXAM: Most Recent Vital Signs: Blood pressure 133/83, pulse 93, temperature 99.2 F (37.3 C), temperature source Oral, resp. rate 16, height _0  (1.803 m), weight 216 lb 8 oz (98.2 kg), SpO2 98 %. BP 133/83 (BP Location: Left Arm, Patient Position: Sitting)   Pulse 93   Temp 99.2 F (37.3 C) (Oral)   Resp 16   Ht _1  (1.803 m)   Wt 216 lb 8 oz (98.2 kg)   SpO2  98%   BMI 30.20 kg/m    General Appearance:    Alert, cooperative, no distress, appears stated age, accompanied by wife, Shirlean Mylar.  Head:    Normocephalic, without obvious abnormality, atraumatic  Eyes:    PERRL, conjunctiva/corneas clear, EOM's intact, fundi    benign, both eyes       Ears:    Not examined  Nose:   Nares normal, septum midline, mucosa normal, no drainage    or sinus tenderness  Throat:   Lips, mucosa, and tongue normal; teeth and gums normal  Neck:   Supple, symmetrical, trachea midline, no adenopathy;       thyroid:  No enlargement/tenderness/nodules; no carotid   bruit or JVD  Back:     Symmetric, no curvature, ROM normal, no CVA tenderness  Lungs:     Clear to auscultation bilaterally, respirations  unlabored  Chest wall:    No tenderness or deformity  Heart:    Regular rate and rhythm, S1 and S2 normal, no murmur, rub   or gallop  Abdomen:     Soft, non-tender, bowel sounds active all four quadrants,    no masses, no organomegaly.  Surgical scar is well healed.  Genitalia:    Not examined  Rectal:    Not examined  Extremities:   Extremities normal, atraumatic, no cyanosis or edema  Pulses:   2+ and symmetric all extremities  Skin:   Skin color, texture, turgor normal, no rashes or lesions  Lymph nodes:   Cervical, supraclavicular, and axillary nodes normal  Neurologic:   CNII-XII intact. Normal strength, sensation and reflexes      throughout    LABORATORY DATA:  Results for orders placed or performed in visit on 04/08/16 (from the past 48 hour(s))  CBC with Differential     Status: Abnormal   Collection Time: 04/08/16  3:56 PM  Result Value Ref Range   WBC 15.0 (H) 4.0 - 10.5 K/uL   RBC 4.20 (L) 4.22 - 5.81 MIL/uL   Hemoglobin 10.9 (L) 13.0 - 17.0 g/dL   HCT 33.2 (L) 39.0 - 52.0 %   MCV 79.0 78.0 - 100.0 fL   MCH 26.0 26.0 - 34.0 pg   MCHC 32.8 30.0 - 36.0 g/dL   RDW 14.3 11.5 - 15.5 %   Platelets 300 150 - 400 K/uL   Neutrophils Relative % 80 %    Neutro Abs 12.1 (H) 1.7 - 7.7 K/uL   Lymphocytes Relative 11 %   Lymphs Abs 1.6 0.7 - 4.0 K/uL   Monocytes Relative 8 %   Monocytes Absolute 1.2 (H) 0.1 - 1.0 K/uL   Eosinophils Relative 1 %   Eosinophils Absolute 0.1 0.0 - 0.7 K/uL   Basophils Relative 0 %   Basophils Absolute 0.0 0.0 - 0.1 K/uL  Comprehensive metabolic panel     Status: Abnormal   Collection Time: 04/08/16  3:56 PM  Result Value Ref Range   Sodium 132 (L) 135 - 145 mmol/L   Potassium 3.6 3.5 - 5.1 mmol/L   Chloride 93 (L) 101 - 111 mmol/L   CO2 28 22 - 32 mmol/L   Glucose, Bld 183 (H) 65 - 99 mg/dL   BUN 23 (H) 6 - 20 mg/dL   Creatinine, Ser 1.39 (H) 0.61 - 1.24 mg/dL   Calcium 10.2 8.9 - 10.3 mg/dL   Total Protein 7.5 6.5 - 8.1 g/dL   Albumin 3.5 3.5 - 5.0 g/dL   AST 15 15 - 41 U/L   ALT 15 (L) 17 - 63 U/L   Alkaline Phosphatase 70 38 - 126 U/L   Total Bilirubin 0.6 0.3 - 1.2 mg/dL   GFR calc non Af Amer 54 (L) >60 mL/min   GFR calc Af Amer >60 >60 mL/min    Comment: (NOTE) The eGFR has been calculated using the CKD EPI equation. This calculation has not been validated in all clinical situations. eGFR's persistently <60 mL/min signify possible Chronic Kidney Disease.    Anion gap 11 5 - 15     Lab Results  Component Value Date   CEA 13.2 (H) 03/20/2016     RADIOGRAPHY: CLINICAL DATA:  Bloody stool, rectal pain for 1 month.  EXAM: CT ABDOMEN AND PELVIS WITH CONTRAST  TECHNIQUE: Multidetector CT imaging of the abdomen and pelvis was performed using the standard protocol following bolus administration of  intravenous contrast.  CONTRAST:  23m ISOVUE-300 IOPAMIDOL (ISOVUE-300) INJECTION 61%, 1024mISOVUE-300 IOPAMIDOL (ISOVUE-300) INJECTION 61%  COMPARISON:  CT scan of March 19, 2010.  FINDINGS: Lower chest: Stable 6 mm nodule is seen laterally and right lung base. This can be considered benign at this point. No other significant abnormality seen in the visualized lung  bases.  Hepatobiliary: No gallstones are noted. Stable right hepatic cyst is noted.  Pancreas: Normal.  Spleen: Normal.  Adrenals/Urinary Tract: Adrenal glands appear normal. Bilateral nonobstructive nephrolithiasis is noted. No hydronephrosis is noted. No ureteral calculi are noted. Urinary bladder is unremarkable.  Stomach/Bowel: There is no evidence of bowel obstruction. Status post appendectomy. Focal sigmoid diverticulitis is noted. There is noted focal narrowing of wall thickening in this area of the sigmoid colon most likely due to inflammation, although neoplasm cannot be excluded.  Vascular/Lymphatic: No significant adenopathy is noted.  Reproductive: Mild prostatic enlargement is noted.  Other: No abnormal fluid collection is noted.  Musculoskeletal: Severe multilevel degenerative disc disease is noted in the lumbar spine.  IMPRESSION: Bilateral nonobstructive nephrolithiasis. No hydronephrosis is noted.  Stable mild prostatic enlargement.  Focal sigmoid diverticulitis is noted without abscess formation. Focal narrowing and wall thickening of the sigmoid colon is noted in this area most likely due to inflammation, but neoplasm cannot be excluded, and sigmoidoscopy is recommended for further evaluation. These results will be called to the ordering clinician or representative by the Radiologist Assistant, and communication documented in the PACS or zVision Dashboard.   Electronically Signed   By: JaMarijo ConceptionM.D.   On: 11/20/2015 14:18       PATHOLOGY:    Colon, segmental resection for tumor, sigmoid - INVASIVE WELL DIFFERENTIATED ADENOCARCINOMA, SPANNING 5 CM IN GREATEST DIMENSION. - TUMOR INVADES THROUGH MUSCULARIS PROPRIA TO INVOLVE SUBSEROSAL SOFT TISSUES. - UNDERLYING MARKED ACUTE AND CHRONIC INFLAMMATION WITH GIANT CELL REACTION AND ABSCESS FORMATION EXTENDING TO THE SEROSAL SURFACE AND INVOLVING THE MESENTERIC TISSUE. -  MARGINS ARE NEGATIVE FOR TUMOR. - FOURTEEN BENIGN LYMPH NODES WHICH DEMONSTRATE VARIABLE INVOLVEMENT WITH ACUTE AND CHRONIC INFLAMMATION WITH ASSOCIATED GIANT CELLS WITHOUT TUMOR IDENTIFIED (0/14).  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 JeArnoldo Moralen 03/23/2016 with an elevated pre-operative CEA at 13.2.  Oncology history developed.   Staging in [facility] problem list completed.  Labs today: CBC diff, CMET, CEA, iron/TIBC, ferritin.  He does not have high risk features:  1.  Lymph nodes analyzed after surgery (less than 12).  2.  Poor prognostic features:   A poorly differentiated histology (exclusive of those that are MSI-H)   B.  Lymphatic/vascular invasion   C.  Bowel obstruction   D.  Perineural invasion   E.  Localized perforation   F.  Close, indeterminate, or positive margins.  From uptodate: Predicted five year DFS estimates for patients with node-negative colon cancer        Disease stage Low grade High grade   Surgery alone, percent Surgery and chemo, percent Surgery alone, percent Surgery and chemo, percent  T3N0 73 (69 to 76) 77 (74 to 80) 65 (60 to 70) 70 (65 to 74)  T4N0 60 (54 to 68) 66 (59 to 73) 51 (43 to 60) 57 (49 to 66)  Range of numbers in parentheses represent 95 percent confidence intervals.  DFS: disease-free survival; chemo: chemotherapy.  Based upon a model derived from 1440 patients with node-negative colon cancer enrolled on seven randomized trials  comparing adjuvant 5-fluorouracil-based treatment with surgery alone Ferd Glassing, et al. J Clin Oncol 2004; 32:0233).  Graphic [phone] Version 3.0  With this information, adjuvant chemotherapy is not recommended.  I have reviewed the NCCN guidelines pertaining to surveillance regarding stage II colon cancer.  He is provided a copy of this.  He is also given patient information regarding colon cancer.  He is provided a clinical  information by NCCN.  Labs in 3 months: CBC diff, CMET, CEA.  He will return in 3 months for ongoing surveillance in accordance with the NCCN guidelines.   NCCN guidelines for surveillance for Colon cancer are as follows (1.2017):  A. Stage I   1. Colonoscopy at year 1    A. If advanced adenoma, repeat in 1 year    B. If no advanced adenoma, repeat in 3 years, and then every 5 years.  B. Stage II, Stage III   1. H+P every 3-6 months x 2 years and then every 6 months for a total of 5 years    2. CEA every 3-6 months x 2 years and then every 6 months for a total of 5 years    3. CT CAP every 6-12 months (category 2B for frequency < 12 months) for a total of 5 years .   4.  Colonoscopy in 1 year except if no preoperative colonoscopy due to obstructing lesion, colonoscopy in 3-6 months.     A. If advanced adenoma, repeat in 1 year    B. If no advanced adenoma, repeat in 3 years, then every 5 years   5. PET/CT scan is not recommended.  C. Stage IV   1. H+P every 3-6 months x 2 years and then every 6 months for a total of 5 years    2. CEA every 3 months x 2 years and then every 6 months for a total of 3- 5 years    3. CT CAP every 3-6 months (category 2B for frequency < 6 months) x 2 years., then every 6-12 months for a total of 5 years .   4. Colonoscopy in 1 year except if no preoperative colonoscopy due to obstructing lesion, colonoscopy in 3-6 months.     A. If advanced adenoma, repeat in 1 year    B. If no advanced adenoma, repeat in 3 years, then every 5 years    ORDERS PLACED FOR THIS ENCOUNTER: Orders Placed This Encounter  Procedures  . CT Abdomen Pelvis W Contrast  . CBC with Differential  . Comprehensive metabolic panel  . CEA  . CBC with Differential  . Comprehensive metabolic panel  . CEA  . Iron and TIBC  . Ferritin    MEDICATIONS PRESCRIBED THIS ENCOUNTER: No orders of the defined types were placed in this encounter.   All questions were answered. The patient  knows to call the clinic with any problems, questions or concerns. We can certainly see the patient much sooner if necessary.  Patient discussed with Dr. Talbert Cage and together we ascertained an up-to-date interval history, and examined the patient.  Dr. Talbert Cage developed the patient's assessment and plan.  This was a shared visit-consultation.  Her attestation will follow below.  This note is electronically signed by: Doy Mince 04/08/2016 5:32 PM

## 2016-04-08 NOTE — Assessment & Plan Note (Addendum)
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 Arnoldo Morale on 03/23/2016 with an elevated pre-operative CEA at 13.2.  Oncology history developed.   Staging in [facility] problem list completed.  Labs today: CBC diff, CMET, CEA, iron/TIBC, ferritin.  He does not have high risk features:  1.  Lymph nodes analyzed after surgery (less than 12).  2.  Poor prognostic features:   A poorly differentiated histology (exclusive of those that are MSI-H)   B.  Lymphatic/vascular invasion   C.  Bowel obstruction   D.  Perineural invasion   E.  Localized perforation   F.  Close, indeterminate, or positive margins.  From uptodate: Predicted five year DFS estimates for patients with node-negative colon cancer  Disease stage Low grade High grade   Surgery alone, percent Surgery and chemo, percent Surgery alone, percent Surgery and chemo, percent  T3N0 73 (69 to 76) 77 (74 to 80) 65 (60 to 70) 70 (65 to 74)  T4N0 60 (54 to 68) 66 (59 to 73) 51 (43 to 60) 57 (49 to 66)  Range of numbers in parentheses represent 95 percent confidence intervals.  DFS: disease-free survival; chemo: chemotherapy.  Based upon a model derived from 1440 patients with node-negative colon cancer enrolled on seven randomized trials comparing adjuvant 5-fluorouracil-based treatment with surgery alone Troy Smith, et al. J Clin Oncol 2004; 22:1797).  Graphic [phone] Version 3.0  With this information, adjuvant chemotherapy is not recommended.  I have reviewed the NCCN guidelines pertaining to surveillance regarding stage II colon cancer.  He is provided a copy of this.  He is also given patient information regarding colon cancer.  He is provided a clinical information by NCCN.  Labs in 3 months: CBC diff, CMET, CEA.  He will return in 3 months for ongoing surveillance in accordance with the NCCN guidelines.   NCCN guidelines for surveillance for Colon cancer are as follows (1.2017):  A. Stage I   1. Colonoscopy  at year 1    A. If advanced adenoma, repeat in 1 year    B. If no advanced adenoma, repeat in 3 years, and then every 5 years.  B. Stage II, Stage III   1. H+P every 3-6 months x 2 years and then every 6 months for a total of 5 years    2. CEA every 3-6 months x 2 years and then every 6 months for a total of 5 years    3. CT CAP every 6-12 months (category 2B for frequency < 12 months) for a total of 5 years .   4.  Colonoscopy in 1 year except if no preoperative colonoscopy due to obstructing lesion, colonoscopy in 3-6 months.     A. If advanced adenoma, repeat in 1 year    B. If no advanced adenoma, repeat in 3 years, then every 5 years   5. PET/CT scan is not recommended.  C. Stage IV   1. H+P every 3-6 months x 2 years and then every 6 months for a total of 5 years    2. CEA every 3 months x 2 years and then every 6 months for a total of 3- 5 years    3. CT CAP every 3-6 months (category 2B for frequency < 6 months) x 2 years., then every 6-12 months for a total of 5 years .   4. Colonoscopy in 1 year except if no preoperative colonoscopy due to obstructing lesion, colonoscopy in 3-6 months.  A. If advanced adenoma, repeat in 1 year    B. If no advanced adenoma, repeat in 3 years, then every 5 years

## 2016-04-09 LAB — CEA: CEA: 1.6 ng/mL (ref 0.0–4.7)

## 2016-04-15 ENCOUNTER — Encounter (HOSPITAL_COMMUNITY): Payer: 59 | Attending: Oncology

## 2016-04-15 DIAGNOSIS — D500 Iron deficiency anemia secondary to blood loss (chronic): Secondary | ICD-10-CM | POA: Diagnosis not present

## 2016-04-15 DIAGNOSIS — C187 Malignant neoplasm of sigmoid colon: Secondary | ICD-10-CM

## 2016-04-15 LAB — IRON AND TIBC
IRON: 41 ug/dL — AB (ref 45–182)
Saturation Ratios: 13 % — ABNORMAL LOW (ref 17.9–39.5)
TIBC: 305 ug/dL (ref 250–450)
UIBC: 264 ug/dL

## 2016-04-15 LAB — FERRITIN: Ferritin: 119 ng/mL (ref 24–336)

## 2016-04-20 ENCOUNTER — Other Ambulatory Visit (HOSPITAL_COMMUNITY): Payer: Self-pay | Admitting: Oncology

## 2016-04-20 DIAGNOSIS — D500 Iron deficiency anemia secondary to blood loss (chronic): Secondary | ICD-10-CM | POA: Insufficient documentation

## 2016-04-21 ENCOUNTER — Encounter (HOSPITAL_COMMUNITY): Payer: Self-pay

## 2016-04-21 ENCOUNTER — Encounter (HOSPITAL_BASED_OUTPATIENT_CLINIC_OR_DEPARTMENT_OTHER): Payer: 59

## 2016-04-21 VITALS — BP 144/88 | HR 85 | Temp 98.3°F | Resp 18

## 2016-04-21 DIAGNOSIS — D500 Iron deficiency anemia secondary to blood loss (chronic): Secondary | ICD-10-CM

## 2016-04-21 DIAGNOSIS — D509 Iron deficiency anemia, unspecified: Secondary | ICD-10-CM

## 2016-04-21 MED ORDER — SODIUM CHLORIDE 0.9 % IV SOLN
Freq: Once | INTRAVENOUS | Status: AC
  Administered 2016-04-21: 15:00:00 via INTRAVENOUS

## 2016-04-21 MED ORDER — SODIUM CHLORIDE 0.9 % IV SOLN
510.0000 mg | Freq: Once | INTRAVENOUS | Status: AC
  Administered 2016-04-21: 510 mg via INTRAVENOUS
  Filled 2016-04-21: qty 17

## 2016-04-21 NOTE — Patient Instructions (Signed)
[facility] Discharge Instructions  RECOMMENDATIONS MADE BY THE CONSULTANT AND ANY TEST RESULTS WILL BE SENT TO YOUR REFERRING PHYSICIAN.  Received Feraheme infusion today.Follow-up as scheduled. Call clinic for any questions or concerns  Thank you for choosing [facility] at [facility] to provide your oncology and hematology care.  To afford each patient quality time with our provider, please arrive at least 15 minutes before your scheduled appointment time.    If you have a lab appointment with the Cancer Center please come in thru the  Main Entrance and check in at the main information desk  You need to re-schedule your appointment should you arrive 10 or more minutes late.  We strive to give you quality time with our providers, and arriving late affects you and other patients whose appointments are after yours.  Also, if you no show three or more times for appointments you may be dismissed from the clinic at the providers discretion.     Again, thank you for choosing [facility].  Our hope is that these requests will decrease the amount of time that you wait before being seen by our physicians.       _____________________________________________________________  Should you have questions after your visit to [facility], please contact our office at [phone] between the hours of 8:30 a.m. and 4:30 p.m.  Voicemails left after 4:30 p.m. will not be returned until the following business day.  For prescription refill requests, have your pharmacy contact our office.       Resources For Cancer Patients and their Caregivers ? American Cancer Society: Can assist with transportation, wigs, general needs, runs Look Good Feel Better.        1-888-227-6333 ? Cancer Care: Provides financial assistance, online support groups, medication/co-pay assistance.  1-800-813-HOPE (4673) ? [facility] Assists [place] cancer patients and their families through emotional , educational and financial support.  336-427-4357 ? [place] DSS Where to apply for food stamps, Medicaid and utility assistance. 336-342-1394 ? [facility]: Transportation to medical appointments. 336-347-2287 ? Social Security Administration: May apply for disability if have a Stage IV cancer. 336-342-7796 1-800-772-1213 ? [place] Aging, Disability and Transit Services: Assists with nutrition, care and transit needs. 336-349-2343  Cancer Center Support Programs: @10RELATIVEDAYS@ > Cancer Support Group  2nd Tuesday of the month 1pm-2pm, Journey Room  > Creative Journey  3rd Tuesday of the month 1130am-1pm, Journey Room  > Look Good Feel Better  1st Wednesday of the month 10am-12 noon, Journey Room (Call American Cancer Society to register 1-800-395-5775)   

## 2016-04-21 NOTE — Progress Notes (Signed)
Grace Bushy Clippinger tolerated Feraheme well without complaints or incident. VSS upon discharge. Pt discharged self ambulatory in satisfactory condition

## 2016-06-16 ENCOUNTER — Encounter (INDEPENDENT_AMBULATORY_CARE_PROVIDER_SITE_OTHER): Payer: Self-pay | Admitting: *Deleted

## 2016-06-16 ENCOUNTER — Other Ambulatory Visit (INDEPENDENT_AMBULATORY_CARE_PROVIDER_SITE_OTHER): Payer: Self-pay | Admitting: *Deleted

## 2016-06-16 ENCOUNTER — Telehealth (INDEPENDENT_AMBULATORY_CARE_PROVIDER_SITE_OTHER): Payer: Self-pay | Admitting: *Deleted

## 2016-06-16 DIAGNOSIS — C189 Malignant neoplasm of colon, unspecified: Secondary | ICD-10-CM | POA: Insufficient documentation

## 2016-06-16 MED ORDER — PEG 3350-KCL-NA BICARB-NACL 420 G PO SOLR: 4000.0000 mL | Freq: Once | ORAL | 0 refills | Status: AC

## 2016-06-16 NOTE — Telephone Encounter (Signed)
Patient needs trilyte 

## 2016-06-16 NOTE — Telephone Encounter (Signed)
agree

## 2016-06-16 NOTE — Telephone Encounter (Signed)
Referring MD/PCP: fusco   Procedure: tcs  Reason/Indication:  Colon ca  Has patient had this procedure before?  Yes, 03/2016  If so, when, by whom and where?    Is there a family history of colon cancer?    Who?  What age when diagnosed?    Is patient diabetic?   yes      Does patient have prosthetic heart valve or mechanical valve?  no  Do you have a pacemaker?  no  Has patient ever had endocarditis? no  Has patient had joint replacement within last 12 months?  no  Does patient tend to be constipated or take laxatives? no  Does patient have a history of alcohol/drug use?  no  Is patient on Coumadin, Plavix and/or Aspirin? no  Medications: see epic  Allergies: nkda  Medication Adjustment per Dr Laural Golden: take 1/2 normal dose of Lantus day before procedure  Procedure date & time: 07/10/16 at 1040

## 2016-07-01 ENCOUNTER — Other Ambulatory Visit (HOSPITAL_COMMUNITY): Payer: 59

## 2016-07-08 ENCOUNTER — Encounter (HOSPITAL_COMMUNITY): Payer: Self-pay

## 2016-07-08 ENCOUNTER — Ambulatory Visit (HOSPITAL_COMMUNITY)
Admission: RE | Admit: 2016-07-08 | Discharge: 2016-07-08 | Disposition: A | Discharge status: Home / Self Care | Payer: 59 | Source: Ambulatory Visit | Attending: Oncology | Admitting: Oncology

## 2016-07-08 DIAGNOSIS — N200 Calculus of kidney: Secondary | ICD-10-CM | POA: Diagnosis not present

## 2016-07-08 DIAGNOSIS — N400 Enlarged prostate without lower urinary tract symptoms: Secondary | ICD-10-CM | POA: Insufficient documentation

## 2016-07-08 DIAGNOSIS — C187 Malignant neoplasm of sigmoid colon: Secondary | ICD-10-CM | POA: Diagnosis present

## 2016-07-08 DIAGNOSIS — N320 Bladder-neck obstruction: Secondary | ICD-10-CM | POA: Diagnosis not present

## 2016-07-08 MED ORDER — IOPAMIDOL (ISOVUE-300) INJECTION 61%
100.0000 mL | Freq: Once | INTRAVENOUS | Status: AC | PRN
  Administered 2016-07-08: 100 mL via INTRAVENOUS

## 2016-07-09 NOTE — Progress Notes (Signed)
Attempted x 2 to call the patient for endoscopy procedure.  No answer.

## 2016-07-10 ENCOUNTER — Ambulatory Visit (HOSPITAL_COMMUNITY)
Admission: RE | Admit: 2016-07-10 | Discharge: 2016-07-10 | Disposition: A | Discharge status: Home / Self Care | Payer: 59 | Source: Ambulatory Visit | Attending: Internal Medicine | Admitting: Internal Medicine

## 2016-07-10 ENCOUNTER — Encounter (HOSPITAL_COMMUNITY)
Admission: RE | Disposition: A | Discharge status: Home / Self Care | Payer: Self-pay | Source: Ambulatory Visit | Attending: Internal Medicine

## 2016-07-10 ENCOUNTER — Encounter (HOSPITAL_COMMUNITY): Payer: Self-pay | Admitting: *Deleted

## 2016-07-10 DIAGNOSIS — Z79899 Other long term (current) drug therapy: Secondary | ICD-10-CM | POA: Insufficient documentation

## 2016-07-10 DIAGNOSIS — D123 Benign neoplasm of transverse colon: Secondary | ICD-10-CM

## 2016-07-10 DIAGNOSIS — Z85038 Personal history of other malignant neoplasm of large intestine: Secondary | ICD-10-CM

## 2016-07-10 DIAGNOSIS — K644 Residual hemorrhoidal skin tags: Secondary | ICD-10-CM | POA: Insufficient documentation

## 2016-07-10 DIAGNOSIS — Z808 Family history of malignant neoplasm of other organs or systems: Secondary | ICD-10-CM | POA: Insufficient documentation

## 2016-07-10 DIAGNOSIS — Z08 Encounter for follow-up examination after completed treatment for malignant neoplasm: Secondary | ICD-10-CM | POA: Diagnosis not present

## 2016-07-10 DIAGNOSIS — Z794 Long term (current) use of insulin: Secondary | ICD-10-CM | POA: Diagnosis not present

## 2016-07-10 DIAGNOSIS — E119 Type 2 diabetes mellitus without complications: Secondary | ICD-10-CM | POA: Insufficient documentation

## 2016-07-10 DIAGNOSIS — C189 Malignant neoplasm of colon, unspecified: Secondary | ICD-10-CM | POA: Insufficient documentation

## 2016-07-10 DIAGNOSIS — Z980 Intestinal bypass and anastomosis status: Secondary | ICD-10-CM | POA: Insufficient documentation

## 2016-07-10 DIAGNOSIS — Z1211 Encounter for screening for malignant neoplasm of colon: Secondary | ICD-10-CM | POA: Diagnosis not present

## 2016-07-10 DIAGNOSIS — I10 Essential (primary) hypertension: Secondary | ICD-10-CM | POA: Diagnosis not present

## 2016-07-10 DIAGNOSIS — Z825 Family history of asthma and other chronic lower respiratory diseases: Secondary | ICD-10-CM | POA: Insufficient documentation

## 2016-07-10 DIAGNOSIS — D124 Benign neoplasm of descending colon: Secondary | ICD-10-CM

## 2016-07-10 HISTORY — PX: COLONOSCOPY: SHX5424

## 2016-07-10 HISTORY — PX: POLYPECTOMY: SHX5525

## 2016-07-10 LAB — GLUCOSE, CAPILLARY: Glucose-Capillary: 161 mg/dL — ABNORMAL HIGH (ref 65–99)

## 2016-07-10 SURGERY — COLONOSCOPY
Anesthesia: Moderate Sedation

## 2016-07-10 MED ORDER — MIDAZOLAM HCL 5 MG/5ML IJ SOLN
INTRAMUSCULAR | Status: DC | PRN
  Administered 2016-07-10 (×2): 2 mg via INTRAVENOUS
  Administered 2016-07-10: 1 mg via INTRAVENOUS
  Administered 2016-07-10: 2 mg via INTRAVENOUS

## 2016-07-10 MED ORDER — STERILE WATER FOR IRRIGATION IR SOLN
Status: DC | PRN
  Administered 2016-07-10: 10:00:00

## 2016-07-10 MED ORDER — MEPERIDINE HCL 50 MG/ML IJ SOLN
INTRAMUSCULAR | Status: AC
  Filled 2016-07-10: qty 1

## 2016-07-10 MED ORDER — SODIUM CHLORIDE 0.9 % IV SOLN
INTRAVENOUS | Status: DC
  Administered 2016-07-10: 1000 mL via INTRAVENOUS

## 2016-07-10 MED ORDER — MEPERIDINE HCL 50 MG/ML IJ SOLN
INTRAMUSCULAR | Status: DC | PRN
Start: 2016-07-10 — End: 2016-07-10
  Administered 2016-07-10 (×2): 25 mg via INTRAVENOUS

## 2016-07-10 MED ORDER — MIDAZOLAM HCL 5 MG/5ML IJ SOLN
INTRAMUSCULAR | Status: AC
  Filled 2016-07-10: qty 10

## 2016-07-10 NOTE — H&P (Signed)
Troy Smith is an 61 y.o. male.   Chief Complaint: Patient is here for colonoscopy. 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 Examination could not be completed because of high-grade stenosis. He underwent sigmoid colon resection. His disease was staged IIA/pT3 N0 M0. He did not require adjuvant therapy. He feels fine. He is returning for colonoscopy in order to do a complete exam. He denies abdominal pain rectal bleeding. Family history is negative for CRC.  Past Medical History:  Diagnosis Date  . 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 Carpal tunnel syndrome 05/02/2014   Bilateral  . Cervical disc disorder with radiculopathy of cervical region 05/02/2014   Left C8  . Diabetes mellitus without complication ([place])   . Hypertension     Past Surgical History:  Procedure Laterality Date  . BIOPSY  03/20/2016   Procedure: BIOPSY;  Surgeon: Rogene Houston, MD;  Location: [facility];  Service: Endoscopy;;  colon  . BOWEL RESECTION N/A 03/23/2016   Procedure: LOW ANTERIOR RESECTION;  Surgeon: Aviva Signs, MD;  Location: [facility];  Service: General;  Laterality: N/A;  . COLONOSCOPY N/A 03/20/2016   Procedure: COLONOSCOPY;  Surgeon: Rogene Houston, MD;  Location: [facility];  Service: Endoscopy;  Laterality: N/A;  730  . NO PAST SURGERIES    . TONSILLECTOMY      Family History  Problem Relation Age of Onset  . COPD Mother   . Melanoma Father    Social History:  reports that he has never smoked. He has never used smokeless tobacco. He reports that he drinks alcohol. He reports that he does not use drugs.  Allergies: No Known Allergies  Medications Prior to Admission  Medication Sig Dispense Refill  . amLODipine (NORVASC) 10 MG tablet Take 10 mg by mouth daily.    . hydrochlorothiazide (HYDRODIURIL) 25 MG tablet Take 25 mg by mouth daily.     . insulin glargine (LANTUS) 100 UNIT/ML injection  Inject 30 Units into the skin at bedtime.     . metoprolol succinate (TOPROL-XL) 50 MG 24 hr tablet Take 50 mg by mouth daily. Take with or immediately following a meal.    . oxyCODONE-acetaminophen (PERCOCET/ROXICET) 5-325 MG tablet Take 1-2 tablets by mouth every 4 (four) hours as needed for moderate pain. (Patient not taking: Reported on 04/08/2016) 30 tablet 0    Results for orders placed or performed during the hospital encounter of 07/10/16 (from the past 48 hour(s))  Glucose, capillary     Status: Abnormal   Collection Time: 07/10/16 10:00 AM  Result Value Ref Range   Glucose-Capillary 161 (H) 65 - 99 mg/dL   Ct Abdomen Pelvis W Contrast  Result Date: 07/09/2016 CLINICAL DATA:  Sigmoid colon adenocarcinoma. Status post low anterior resection. EXAM: CT ABDOMEN AND PELVIS WITH CONTRAST TECHNIQUE: Multidetector CT imaging of the abdomen and pelvis was performed using the standard protocol following bolus administration of intravenous contrast. CONTRAST:  177mL ISOVUE-300 IOPAMIDOL (ISOVUE-300) INJECTION 61% COMPARISON:  11/20/2015 and 11/09/2007 FINDINGS: Lower Chest: No acute findings. Stable tiny sub-cm pulmonary nodules in both lung bases, consistent with benign etiology. Hepatobiliary: A few tiny sub-cm cysts are stable. No evidence of hepatic masses. Gallbladder is unremarkable. Pancreas:  No mass or inflammatory changes. Spleen: Within normal limits in size and appearance. Adrenals/Urinary Tract: No masses identified. Stable mild bilateral renal parenchymal scarring. Tiny less than 5 mm nonobstructive calculi are again  seen in both kidneys. No evidence of ureteral calculi or hydronephrosis. Stable mild diffuse bladder wall thickening, consistent with chronic bladder outlet obstruction . Stomach/Bowel: Surgical staples seen near rectosigmoid junction. No significant bowel wall thickening or mass identified. No evidence of inflammatory process or abnormal fluid collections. Vascular/Lymphatic: No  pathologically enlarged lymph nodes. No abdominal aortic aneurysm. Reproductive: Stable mildly enlarged prostate gland with median lobe hypertrophy indenting bladder base. Other:  None. Musculoskeletal:  No suspicious bone lesions identified. IMPRESSION: No evidence of metastatic disease or other acute findings. Bilateral nonobstructing renal calculi and mild bilateral renal parenchymal scarring. Stable mildly enlarged prostate gland and findings of chronic bladder outlet obstruction. Electronically Signed   By: Earle Gell M.D.   On: 07/09/2016 09:25    ROS  Blood pressure 133/82, pulse 74, temperature 98.1 F (36.7 C), temperature source Oral, resp. rate 16, height 5\' 11"  (1.803 m), weight 220 lb (99.8 kg), SpO2 97 %. Physical Exam  Constitutional: He appears well-developed and well-nourished.  HENT:  Mouth/Throat: Oropharynx is clear and moist.  Eyes: Conjunctivae are normal. No scleral icterus.  Neck: No thyromegaly present.  Cardiovascular: Normal rate, regular rhythm and normal heart sounds.   No murmur heard. Respiratory: Effort normal and breath sounds normal.  GI:  Abdomen is full with lower midline scar. It is soft and nontender without organomegaly or masses.  Musculoskeletal: He exhibits no edema.  Lymphadenopathy:    He has no cervical adenopathy.  Neurological: He is alert.  Skin: Skin is warm and dry.     Assessment/Plan History of colon carcinoma.. Status post sigmoid colon resection in February 2018. Examination was incomplete. Patient here for surveillance colonoscopy.  Hildred Laser, MD 07/10/2016, 10:15 AM

## 2016-07-10 NOTE — Op Note (Signed)
[facility] Patient Name: Troy Smith Procedure Date: 07/10/2016 9:56 AM MRN: [id number] Date of Birth: 1955/06/29 Attending MD: Hildred Laser , MD CSN: [id number] Age: 61 Admit Type: Outpatient Procedure:                Colonoscopy Indications:              High risk colon cancer surveillance: Personal                            history of colon cancer Providers:                Hildred Laser, MD, Janeece Riggers, RN, Otis Peak B. Sharon Seller, RN Referring MD:             Halford Chessman, MD Medicines:                Meperidine 50 mg IV, Midazolam 7 mg IV Complications:            No immediate complications. Estimated Blood Loss:     Estimated blood loss: none. Procedure:                Pre-Anesthesia Assessment:                           - Prior to the procedure, a History and Physical                            was performed, and patient medications and                            allergies were reviewed. The patient's tolerance of                            previous anesthesia was also reviewed. The risks                            and benefits of the procedure and the sedation                            options and risks were discussed with the patient.                            All questions were answered, and informed consent                            was obtained. Prior Anticoagulants: The patient has                            taken no previous anticoagulant or antiplatelet                            agents. ASA Grade Assessment: II - A patient with  mild systemic disease. After reviewing the risks                            and benefits, the patient was deemed in                            satisfactory condition to undergo the procedure.                           After obtaining informed consent, the colonoscope                            was passed under direct vision. Throughout the                            procedure,  the patient's blood pressure, pulse, and                            oxygen saturations were monitored continuously. The                            EC-[id number] ([id number]) scope was introduced through                            the anus and advanced to the the cecum, identified                            by appendiceal orifice and ileocecal valve. The                            colonoscopy was performed without difficulty. The                            patient tolerated the procedure well. The quality                            of the bowel preparation was adequate. The                            ileocecal valve, appendiceal orifice, and rectum                            were photographed. Scope In: 10:25:14 AM Scope Out: 10:47:00 AM Scope Withdrawal Time: 0 hours 16 minutes 7 seconds  Total Procedure Duration: 0 hours 21 minutes 46 seconds  Findings:      The perianal and digital rectal examinations were normal.      Two sessile polyps were found in the splenic flexure and transverse       colon. The polyps were small in size. These were biopsied with a cold       forceps for histology. The pathology specimen was placed into Bottle       Number 1.      Four sessile polyps were found in the descending colon and transverse       colon. The polyps  were small in size. These polyps were removed with a       cold snare. Resection and retrieval were complete. The pathology       specimen was placed into Bottle Number 1.      There was evidence of a prior end-to-end colo-colonic anastomosis in the       recto-sigmoid colon. This was patent and was characterized by healthy       appearing mucosa.      External hemorrhoids were found during retroflexion. The hemorrhoids       were small. Impression:               - Two small polyps at the splenic flexure and in                            the transverse colon. Biopsied.                           - Four small polyps in the descending colon and in                             the transverse colon, removed with a cold snare.                            Resected and retrieved.                           - Patent end-to-end colo-colonic anastomosis,                            characterized by healthy appearing mucosa.                           - External hemorrhoids. Moderate Sedation:      Moderate (conscious) sedation was administered by the endoscopy nurse       and supervised by the endoscopist. The following parameters were       monitored: oxygen saturation, heart rate, blood pressure, CO2       capnography and response to care. Total physician intraservice time was       26 minutes. Recommendation:           - Patient has a contact number available for                            emergencies. The signs and symptoms of potential                            delayed complications were discussed with the                            patient. Return to normal activities tomorrow.                            Written discharge instructions were provided to the                            patient.                           -  Resume previous diet today.                           - Continue present medications.                           - Await pathology results.                           - Repeat colonoscopy in 3 years for surveillance. Procedure Code(s):        --- Professional ---                           [phone], Colonoscopy, flexible; with removal of                            tumor(s), polyp(s), or other lesion(s) by snare                            technique                           45380, 59, Colonoscopy, flexible; with biopsy,                            single or multiple                           99152, Moderate sedation services provided by the                            same physician or other qualified health care                            professional performing the diagnostic or                            therapeutic service that  the sedation supports,                            requiring the presence of an independent trained                            observer to assist in the monitoring of the                            patient's level of consciousness and physiological                            status; initial 15 minutes of intraservice time,                            patient age 26 years or older                           [phone], Moderate sedation services; each additional  15 minutes intraservice time Diagnosis Code(s):        --- Professional ---                           [phone], Personal history of other malignant                            neoplasm of large intestine                           D12.4, Benign neoplasm of descending colon                           D12.3, Benign neoplasm of transverse colon (hepatic                            flexure or splenic flexure)                           Z98.0, Intestinal bypass and anastomosis status                           K64.4, Residual hemorrhoidal skin tags CPT copyright 2016 American Medical Association. All rights reserved. The codes documented in this report are preliminary and upon coder review may  be revised to meet current compliance requirements. Hildred Laser, MD Hildred Laser, MD 07/10/2016 10:54:42 AM This report has been signed electronically. Number of Addenda: 0

## 2016-07-10 NOTE — Discharge Instructions (Signed)
Resume usual medications and diet. No driving for 24 hours. Physician will call with biopsy results.   Colonoscopy, Adult, Care After This sheet gives you information about how to care for yourself after your procedure. Your health care provider may also give you more specific instructions. If you have problems or questions, contact your health care provider. What can I expect after the procedure? After the procedure, it is common to have:  A small amount of blood in your stool for 24 hours after the procedure.  Some gas.  Mild abdominal cramping or bloating.  Follow these instructions at home: General instructions   For the first 24 hours after the procedure: ? Do not drive or use machinery. ? Do not sign important documents. ? Do not drink alcohol. ? Do your regular daily activities at a slower pace than normal. ? Eat soft, easy-to-digest foods. ? Rest often.  Take over-the-counter or prescription medicines only as told by your health care provider.  It is up to you to get the results of your procedure. Ask your health care provider, or the department performing the procedure, when your results will be ready. Relieving cramping and bloating  Try walking around when you have cramps or feel bloated.  Apply heat to your abdomen as told by your health care provider. Use a heat source that your health care provider recommends, such as a moist heat pack or a heating pad. ? Place a towel between your skin and the heat source. ? Leave the heat on for 20-30 minutes. ? Remove the heat if your skin turns bright red. This is especially important if you are unable to feel pain, heat, or cold. You may have a greater risk of getting burned. Eating and drinking  Drink enough fluid to keep your urine clear or pale yellow.  Resume your normal diet as instructed by your health care provider. Avoid heavy or fried foods that are hard to digest.  Avoid drinking alcohol for as long as  instructed by your health care provider. Contact a health care provider if:  You have blood in your stool 2-3 days after the procedure. Get help right away if:  You have more than a small spotting of blood in your stool.  You pass large blood clots in your stool.  Your abdomen is swollen.  You have nausea or vomiting.  You have a fever.  You have increasing abdominal pain that is not relieved with medicine. This information is not intended to replace advice given to you by your health care provider. Make sure you discuss any questions you have with your health care provider. Document Released: 09/10/2003 Document Revised: 10/21/2015 Document Reviewed: 04/09/2015 Elsevier Interactive Patient Education  [employer].

## 2016-07-15 ENCOUNTER — Encounter (HOSPITAL_COMMUNITY): Payer: 59 | Attending: Oncology | Admitting: Oncology

## 2016-07-15 ENCOUNTER — Encounter (HOSPITAL_COMMUNITY): Payer: Self-pay

## 2016-07-15 ENCOUNTER — Ambulatory Visit (HOSPITAL_COMMUNITY): Payer: 59

## 2016-07-15 VITALS — BP 144/82 | HR 73 | Temp 98.2°F | Resp 18 | Wt 236.7 lb

## 2016-07-15 DIAGNOSIS — C187 Malignant neoplasm of sigmoid colon: Secondary | ICD-10-CM | POA: Diagnosis not present

## 2016-07-15 DIAGNOSIS — I10 Essential (primary) hypertension: Secondary | ICD-10-CM | POA: Diagnosis not present

## 2016-07-15 DIAGNOSIS — E119 Type 2 diabetes mellitus without complications: Secondary | ICD-10-CM | POA: Diagnosis not present

## 2016-07-15 DIAGNOSIS — D500 Iron deficiency anemia secondary to blood loss (chronic): Secondary | ICD-10-CM | POA: Insufficient documentation

## 2016-07-15 NOTE — Patient Instructions (Signed)
[facility] at [facility] Discharge Instructions  RECOMMENDATIONS MADE BY THE CONSULTANT AND ANY TEST RESULTS WILL BE SENT TO YOUR REFERRING PHYSICIAN.  You saw Dr. Zhou today.  Thank you for choosing [facility] at [facility] to provide your oncology and hematology care.  To afford each patient quality time with our provider, please arrive at least 15 minutes before your scheduled appointment time.    If you have a lab appointment with the Cancer Center please come in thru the  Main Entrance and check in at the main information desk  You need to re-schedule your appointment should you arrive 10 or more minutes late.  We strive to give you quality time with our providers, and arriving late affects you and other patients whose appointments are after yours.  Also, if you no show three or more times for appointments you may be dismissed from the clinic at the providers discretion.     Again, thank you for choosing [facility].  Our hope is that these requests will decrease the amount of time that you wait before being seen by our physicians.       _____________________________________________________________  Should you have questions after your visit to [facility], please contact our office at [phone] between the hours of 8:30 a.m. and 4:30 p.m.  Voicemails left after 4:30 p.m. will not be returned until the following business day.  For prescription refill requests, have your pharmacy contact our office.       Resources For Cancer Patients and their Caregivers ? American Cancer Society: Can assist with transportation, wigs, general needs, runs Look Good Feel Better.        1-888-227-6333 ? Cancer Care: Provides financial assistance, online support groups, medication/co-pay assistance.  1-800-813-HOPE (4673) ? [facility] Assists [place] cancer patients and their families through  emotional , educational and financial support.  336-427-4357 ? [place] DSS Where to apply for food stamps, Medicaid and utility assistance. 336-342-1394 ? [facility]: Transportation to medical appointments. 336-347-2287 ? Social Security Administration: May apply for disability if have a Stage IV cancer. 336-342-7796 1-800-772-1213 ? [place] [facility]ging, Disability and Transit Services: Assists with nutrition, care and transit needs. 336-349-2343  Cancer Center Support Programs: @10RELATIVEDAYS@ > Cancer Support Group  2nd Tuesday of the month 1pm-2pm, Journey Room  > Creative Journey  3rd Tuesday of the month 1130am-1pm, Journey Room  > Look Good Feel Better  1st Wednesday of the month 10am-12 noon, Journey Room (Call American Cancer Society to register 1-800-395-5775)    

## 2016-07-16 NOTE — Progress Notes (Signed)
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 Arnoldo Morale on 03/23/2016 with an elevated pre-operative CEA at 13.2.  HISTORY OF PRESENT ILLNESS:   Troy Smith is a 61 y.o. 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 Arnoldo Morale on 03/23/2016 with an elevated pre-operative CEA at 13.2.  He reports that he was seen by GI for abdominal discomfort.  He notes that CT imaging was performed.  Following CT scan, he was diagnosed with diverticulitis and started on antibiotics.  Antibiotics were completed and symptoms did not improve.  This resulted in the patient representing back to GI.  Saw Dr. [place] who recommended a colonoscopy.  Colonoscopy was performed on 03/20/2016 which demonstrated a fungating, polypoid and ulcerated partially obstructing large mass in the distal sigmoid mass was circumferential with high-grade stricture.  Mass measures 5 cm in length.  Oozing was present.  Mucosa was biopsied with cold forceps for histology.  Impression of this exam reads "malignant partially obstructing tumor in the distal colon, biopsied.  Incomplete exam."  He saw Dr. Arnoldo Morale who performed a LAR on 03/23/2016.  Pre-operative CEA was elevated at 13.2.  INTERVAL HISTORY: Patient presented for follow up today. He states that he is doing well. He has not had any issues with his bowel movements or any GI bleeding. He had a repeat colonoscopy on 07/10/16  which was negative for malignancy.   Review of Systems  Constitutional: Negative.  Negative for chills, fever and weight loss.  HENT: Negative.   Eyes: Negative.   Respiratory: Negative.  Negative for cough.   Cardiovascular: Negative.  Negative for chest pain.  Gastrointestinal: Negative.  Negative for blood in stool, constipation, diarrhea, melena, nausea and vomiting.  Genitourinary: Negative.   Musculoskeletal: Negative.   Skin: Negative.   Neurological: Negative.  Negative for weakness.  Endo/Heme/Allergies: Negative.   Psychiatric/Behavioral: Negative.      PAST MEDICAL HISTORY:   Past Medical History:  Diagnosis Date  . Adenocarcinoma of sigmoid colon ([place]) 03/23/2016  . Carpal tunnel syndrome 05/02/2014   Bilateral  . Cervical disc disorder with radiculopathy of cervical region 05/02/2014   Left C8  . Diabetes mellitus without complication ([place])   . Hypertension     ALLERGIES: No Known Allergies    MEDICATIONS: I have reviewed the patient's current medications.    Current Outpatient Prescriptions on File Prior to Visit  Medication Sig Dispense Refill  . amLODipine (NORVASC) 10 MG tablet Take 10 mg by mouth daily.    . hydrochlorothiazide (HYDRODIURIL) 25 MG tablet Take 25 mg by mouth daily.     . insulin glargine (LANTUS) 100 UNIT/ML injection Inject 30 Units into the skin at bedtime.     . metoprolol succinate (TOPROL-XL) 50 MG 24 hr tablet Take 50 mg by mouth daily. Take with or immediately following a meal.     No current facility-administered medications on file prior to visit.  PAST SURGICAL HISTORY Past Surgical History:  Procedure Laterality Date  . BIOPSY  03/20/2016   Procedure: BIOPSY;  Surgeon: Rogene Houston, MD;  Location: [facility];  Service: Endoscopy;;  colon  . BOWEL RESECTION N/A 03/23/2016   Procedure: LOW ANTERIOR RESECTION;  Surgeon: Aviva Signs, MD;  Location: [facility] ORS;  Service: General;  Laterality: N/A;  . COLONOSCOPY N/A 03/20/2016    Procedure: COLONOSCOPY;  Surgeon: Rogene Houston, MD;  Location: [facility];  Service: Endoscopy;  Laterality: N/A;  730  . NO PAST SURGERIES    . TONSILLECTOMY      FAMILY HISTORY: Family History  Problem Relation Age of Onset  . COPD Mother   . Melanoma Father    Mother deceased at the age of 17 due to complications of COPD Father deceased at the age of 37 due to metastatic melanoma.  SOCIAL HISTORY:  reports that he has never smoked. He has never used smokeless tobacco. He reports that he drinks alcohol. He reports that he does not use drugs.  He is married x 33 years.  He does not have children.  He is a Public librarian.  He is [employer].  Social History   Social History  . Marital status: Married    Spouse name: N/A  . Number of children: N/A  . Years of education: N/A   Social History Main Topics  . Smoking status: Never Smoker  . Smokeless tobacco: Never Used  . Alcohol use Yes     Comment: rarely  . Drug use: No  . Sexual activity: Not Asked   Other Topics Concern  . None   Social History Narrative  . None    PERFORMANCE STATUS: The patient's performance status is 0 - Asymptomatic  PHYSICAL EXAM: Most Recent Vital Signs: Blood pressure (!) 144/82, pulse 73, temperature 98.2 F (36.8 C), temperature source Oral, resp. rate 18, weight 236 lb 11.2 oz (107.4 kg), SpO2 95 %. BP (!) 144/82 (BP Location: Left Arm, Patient Position: Sitting)   Pulse 73   Temp 98.2 F (36.8 C) (Oral)   Resp 18   Wt 236 lb 11.2 oz (107.4 kg)   SpO2 95%   BMI 33.01 kg/m   Physical Exam  Constitutional: He is oriented to person, place, and time and well-developed, well-nourished, and in no distress. No distress.  HENT:  Head: Normocephalic and atraumatic.  Mouth/Throat: No oropharyngeal exudate.  Eyes: Conjunctivae are normal. Pupils are equal, round, and reactive to light. No scleral icterus.  Neck: Normal range of motion. Neck supple. No JVD present.    Cardiovascular: Normal rate, regular rhythm and normal heart sounds.  Exam reveals no gallop and no friction rub.   No murmur heard. Pulmonary/Chest: Breath sounds normal. No respiratory distress. He has no wheezes. He has no rales.  Abdominal: Soft. Bowel sounds are normal. He exhibits no distension. There is no tenderness. There is no guarding.  Musculoskeletal: He exhibits no edema or tenderness.  Lymphadenopathy:    He has no cervical adenopathy.  Neurological: He is alert and oriented to person, place, and time. No cranial nerve deficit.  Skin: Skin is warm and dry. No rash noted. No erythema. No pallor.  Psychiatric: Affect and judgment normal.    LABORATORY DATA:  No results found for this or any previous visit (from the past 48 hour(s)).   Lab Results  Component Value Date   CEA 1.6 04/08/2016     RADIOGRAPHY: CLINICAL DATA:  Bloody stool,  rectal pain for 1 month.  EXAM: CT ABDOMEN AND PELVIS WITH CONTRAST  TECHNIQUE: Multidetector CT imaging of the abdomen and pelvis was performed using the standard protocol following bolus administration of intravenous contrast.  CONTRAST:  49mL ISOVUE-300 IOPAMIDOL (ISOVUE-300) INJECTION 61%, 158mL ISOVUE-300 IOPAMIDOL (ISOVUE-300) INJECTION 61%  COMPARISON:  CT scan of March 19, 2010.  FINDINGS: Lower chest: Stable 6 mm nodule is seen laterally and right lung base. This can be considered benign at this point. No other significant abnormality seen in the visualized lung bases.  Hepatobiliary: No gallstones are noted. Stable right hepatic cyst is noted.  Pancreas: Normal.  Spleen: Normal.  Adrenals/Urinary Tract: Adrenal glands appear normal. Bilateral nonobstructive nephrolithiasis is noted. No hydronephrosis is noted. No ureteral calculi are noted. Urinary bladder is unremarkable.  Stomach/Bowel: There is no evidence of bowel obstruction. Status post appendectomy. Focal sigmoid diverticulitis is noted.  There is noted focal narrowing of wall thickening in this area of the sigmoid colon most likely due to inflammation, although neoplasm cannot be excluded.  Vascular/Lymphatic: No significant adenopathy is noted.  Reproductive: Mild prostatic enlargement is noted.  Other: No abnormal fluid collection is noted.  Musculoskeletal: Severe multilevel degenerative disc disease is noted in the lumbar spine.  IMPRESSION: Bilateral nonobstructive nephrolithiasis. No hydronephrosis is noted.  Stable mild prostatic enlargement.  Focal sigmoid diverticulitis is noted without abscess formation. Focal narrowing and wall thickening of the sigmoid colon is noted in this area most likely due to inflammation, but neoplasm cannot be excluded, and sigmoidoscopy is recommended for further evaluation. These results will be called to the ordering clinician or representative by the Radiologist Assistant, and communication documented in the PACS or zVision Dashboard.   Electronically Signed   By: Marijo Conception, M.D.   On: 11/20/2015 14:18       PATHOLOGY:    Colon, segmental resection for tumor, sigmoid - INVASIVE WELL DIFFERENTIATED ADENOCARCINOMA, SPANNING 5 CM IN GREATEST DIMENSION. - TUMOR INVADES THROUGH MUSCULARIS PROPRIA TO INVOLVE SUBSEROSAL SOFT TISSUES. - UNDERLYING MARKED ACUTE AND CHRONIC INFLAMMATION WITH GIANT CELL REACTION AND ABSCESS FORMATION EXTENDING TO THE SEROSAL SURFACE AND INVOLVING THE MESENTERIC TISSUE. - MARGINS ARE NEGATIVE FOR TUMOR. - FOURTEEN BENIGN LYMPH NODES WHICH DEMONSTRATE VARIABLE INVOLVEMENT WITH ACUTE AND CHRONIC INFLAMMATION WITH ASSOCIATED GIANT CELLS WITHOUT TUMOR IDENTIFIED (0/14).  Colonoscopy 07/10/16: - Two small polyps at the splenic flexure and in the transverse colon. Biopsied. - Four small polyps in the descending colon and in the transverse colon, removed with a cold snare. Resected and retrieved. - Patent end-to-end colo-colonic  anastomosis, characterized by healthy appearing mucosa. - External hemorrhoids.  Diagnosis Colon, polyp(s), transverse, splenic flexure, descending - TUBULAR ADENOMA(S). - HIGH GRADE DYSPLASIA IS NOT IDENTIFIED. 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 Arnoldo Morale on 03/23/2016 with an elevated pre-operative CEA at 13.2.  PLAN: Clinically NED. I have ordered a restaging CT chest/abd/pelvis with contrast prior to next visit. Labs prior to next visit.  RTC in 3 months for follow up.    ORDERS PLACED FOR THIS ENCOUNTER: Orders Placed This Encounter  Procedures  . CT Abdomen Pelvis W Contrast  . CT Chest W Contrast  . CBC with Differential  . Comprehensive metabolic panel  . CEA    All questions were answered. The patient knows to call the clinic with any problems, questions or concerns. We can certainly see the patient much sooner if necessary.  Patient discussed with Dr. Talbert Cage and  together we ascertained an up-to-date interval history, and examined the patient.  Dr. Talbert Cage developed the patient's assessment and plan.  This was a shared visit-consultation.  Her attestation will follow below.  This note is electronically signed by: Twana First, MD 07/16/2016 9:49 AM

## 2016-07-20 ENCOUNTER — Encounter (HOSPITAL_COMMUNITY): Payer: Self-pay | Admitting: Internal Medicine

## 2016-07-24 ENCOUNTER — Ambulatory Visit: Payer: 59 | Admitting: Urology

## 2016-10-14 ENCOUNTER — Ambulatory Visit (HOSPITAL_COMMUNITY)
Admission: RE | Admit: 2016-10-14 | Discharge: 2016-10-14 | Disposition: A | Discharge status: Home / Self Care | Payer: 59 | Source: Ambulatory Visit | Attending: Oncology | Admitting: Oncology

## 2016-10-14 ENCOUNTER — Other Ambulatory Visit (HOSPITAL_COMMUNITY): Payer: 59

## 2016-10-14 DIAGNOSIS — R161 Splenomegaly, not elsewhere classified: Secondary | ICD-10-CM | POA: Diagnosis not present

## 2016-10-14 DIAGNOSIS — I700 Atherosclerosis of aorta: Secondary | ICD-10-CM | POA: Insufficient documentation

## 2016-10-14 DIAGNOSIS — C187 Malignant neoplasm of sigmoid colon: Secondary | ICD-10-CM | POA: Diagnosis present

## 2016-10-14 DIAGNOSIS — N400 Enlarged prostate without lower urinary tract symptoms: Secondary | ICD-10-CM | POA: Insufficient documentation

## 2016-10-14 DIAGNOSIS — I2510 Atherosclerotic heart disease of native coronary artery without angina pectoris: Secondary | ICD-10-CM | POA: Diagnosis not present

## 2016-10-14 DIAGNOSIS — N200 Calculus of kidney: Secondary | ICD-10-CM | POA: Diagnosis not present

## 2016-10-14 LAB — POCT I-STAT CREATININE: Creatinine, Ser: 1.1 mg/dL (ref 0.61–1.24)

## 2016-10-14 MED ORDER — IOPAMIDOL (ISOVUE-300) INJECTION 61%
100.0000 mL | Freq: Once | INTRAVENOUS | Status: AC | PRN
  Administered 2016-10-14: 100 mL via INTRAVENOUS

## 2016-10-21 ENCOUNTER — Encounter (HOSPITAL_COMMUNITY): Payer: Self-pay | Admitting: Oncology

## 2016-10-21 ENCOUNTER — Encounter (HOSPITAL_COMMUNITY): Payer: 59 | Attending: Oncology | Admitting: Oncology

## 2016-10-21 ENCOUNTER — Encounter (HOSPITAL_COMMUNITY): Payer: 59

## 2016-10-21 VITALS — BP 142/85 | HR 77 | Temp 98.2°F | Resp 18 | Wt 241.9 lb

## 2016-10-21 DIAGNOSIS — C187 Malignant neoplasm of sigmoid colon: Secondary | ICD-10-CM | POA: Diagnosis not present

## 2016-10-21 DIAGNOSIS — D500 Iron deficiency anemia secondary to blood loss (chronic): Secondary | ICD-10-CM | POA: Diagnosis not present

## 2016-10-21 LAB — CBC WITH DIFFERENTIAL/PLATELET
BASOS ABS: 0 10*3/uL (ref 0.0–0.1)
Basophils Relative: 0 %
EOS ABS: 0.2 10*3/uL (ref 0.0–0.7)
Eosinophils Relative: 3 %
HEMATOCRIT: 37.6 % — AB (ref 39.0–52.0)
Hemoglobin: 12.6 g/dL — ABNORMAL LOW (ref 13.0–17.0)
Lymphocytes Relative: 25 %
Lymphs Abs: 2 10*3/uL (ref 0.7–4.0)
MCH: 28.3 pg (ref 26.0–34.0)
MCHC: 33.5 g/dL (ref 30.0–36.0)
MCV: 84.3 fL (ref 78.0–100.0)
MONO ABS: 0.5 10*3/uL (ref 0.1–1.0)
Monocytes Relative: 7 %
NEUTROS ABS: 5.2 10*3/uL (ref 1.7–7.7)
Neutrophils Relative %: 65 %
PLATELETS: 211 10*3/uL (ref 150–400)
RBC: 4.46 MIL/uL (ref 4.22–5.81)
RDW: 13.4 % (ref 11.5–15.5)
WBC: 7.9 10*3/uL (ref 4.0–10.5)

## 2016-10-21 LAB — COMPREHENSIVE METABOLIC PANEL
ALBUMIN: 4.1 g/dL (ref 3.5–5.0)
ALT: 15 U/L — ABNORMAL LOW (ref 17–63)
ANION GAP: 9 (ref 5–15)
AST: 16 U/L (ref 15–41)
Alkaline Phosphatase: 61 U/L (ref 38–126)
BUN: 16 mg/dL (ref 6–20)
CHLORIDE: 100 mmol/L — AB (ref 101–111)
CO2: 26 mmol/L (ref 22–32)
Calcium: 9.5 mg/dL (ref 8.9–10.3)
Creatinine, Ser: 1.19 mg/dL (ref 0.61–1.24)
GFR calc Af Amer: >60 mL/min (ref >60)
GFR calc non Af Amer: >60 mL/min (ref >60)
GLUCOSE: 141 mg/dL — AB (ref 65–99)
POTASSIUM: 3.5 mmol/L (ref 3.5–5.1)
SODIUM: 135 mmol/L (ref 135–145)
TOTAL PROTEIN: 7.2 g/dL (ref 6.5–8.1)
Total Bilirubin: 0.8 mg/dL (ref 0.3–1.2)

## 2016-10-21 NOTE — Patient Instructions (Addendum)
[place] at [facility] Discharge Instructions  RECOMMENDATIONS MADE BY THE CONSULTANT AND ANY TEST RESULTS WILL BE SENT TO YOUR REFERRING PHYSICIAN.  You were seen today by Dr. Twana First Follow up in 3 months with lab work We will get your referred to orthopedics  Thank you for choosing [place] at [facility] to provide your oncology and hematology care.  To afford each patient quality time with our provider, please arrive at least 15 minutes before your scheduled appointment time.    If you have a lab appointment with the [place] please come in thru the  Main Entrance and check in at the main information desk  You need to re-schedule your appointment should you arrive 10 or more minutes late.  We strive to give you quality time with our providers, and arriving late affects you and other patients whose appointments are after yours.  Also, if you no show three or more times for appointments you may be dismissed from the clinic at the providers discretion.     Again, thank you for choosing [facility].  Our hope is that these requests will decrease the amount of time that you wait before being seen by our physicians.       _____________________________________________________________  Should you have questions after your visit to [facility], please contact our office at [phone] between the hours of 8:30 a.m. and 4:30 p.m.  Voicemails left after 4:30 p.m. will not be returned until the following business day.  For prescription refill requests, have your pharmacy contact our office.       Resources For Cancer Patients and their Caregivers ? American Cancer Society: Can assist with transportation, wigs, general needs, runs Look Good Feel Better.        (267)236-7707 ? Cancer Care: Provides financial assistance, online support groups, medication/co-pay assistance.  1-800-813-HOPE 9012547714) ? [place] Assists [place] cancer patients and their families through emotional , educational and financial support.  (269)060-9395 ? [place] DSS Where to apply for food stamps, Medicaid and utility assistance. (540)251-0066 ? [facility]: Transportation to medical appointments. 9173636205 ? Social Security Administration: May apply for disability if have a Stage IV cancer. 618-876-4376 331-562-3332 ? [employer], Disability and Transit Services: Assists with nutrition, care and transit needs. [place] Support Programs: @10RELATIVEDAYS @ > Cancer Support Group  2nd Tuesday of the month 1pm-2pm, Journey Room  > Creative Journey  3rd Tuesday of the month 1130am-1pm, Journey Room  > Look Good Feel Better  1st Wednesday of the month 10am-12 noon, Journey Room (Call [place] to register [phone])

## 2016-10-21 NOTE — Progress Notes (Signed)
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 Arnoldo Morale on 03/23/2016 with an elevated pre-operative CEA at 13.2.  HISTORY OF PRESENT ILLNESS:   Troy Smith is a 61 y.o. 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 Arnoldo Morale on 03/23/2016 with an elevated pre-operative CEA at 13.2.  He reports that he was seen by GI for abdominal discomfort.  He notes that CT imaging was performed.  Following CT scan, he was diagnosed with diverticulitis and started on antibiotics.  Antibiotics were completed and symptoms did not improve.  This resulted in the patient representing back to GI.  Saw Dr. [place] who recommended a colonoscopy.  Colonoscopy was performed on 03/20/2016 which demonstrated a fungating, polypoid and ulcerated partially obstructing large mass in the distal sigmoid mass was circumferential with high-grade stricture.  Mass measures 5 cm in length.  Oozing was present.  Mucosa was biopsied with cold forceps for histology.  Impression of this exam reads "malignant partially obstructing tumor in the distal colon, biopsied.  Incomplete exam."  He saw Dr. Arnoldo Morale who performed a LAR on 03/23/2016.  Pre-operative CEA was elevated at 13.2.  He had a repeat colonoscopy on 07/10/16 which was negative for malignancy.   INTERVAL HISTORY:  Patient presented for follow up today and review of his restaging scans. He states that he is doing  well. He has not had any issues with his bowel movements or any GI bleeding. His appetite is good and he is maintaining his weight. He has some fatigue. He states he has intermittent constipation and sometimes diarrhea depending on what he eats. He states he's been having more neck pain, and used to get steroid injections for his cervical radiculopathy, and wanted a referral to be made.   Review of Systems  Constitutional: Positive for malaise/fatigue. Negative for chills, fever and weight loss.  HENT: Negative.   Eyes: Negative.   Respiratory: Negative.  Negative for cough.   Cardiovascular: Negative.  Negative for chest pain.  Gastrointestinal: Positive for constipation. Negative for blood in stool, diarrhea, melena, nausea and vomiting.  Genitourinary: Negative.   Musculoskeletal: Negative.   Skin: Negative.   Neurological: Negative.  Negative for weakness.  Endo/Heme/Allergies: Negative.   Psychiatric/Behavioral: Negative.      PAST MEDICAL HISTORY:   Past Medical History:  Diagnosis Date  . Adenocarcinoma of sigmoid colon ([place]) 03/23/2016  . Carpal tunnel syndrome 05/02/2014   Bilateral  . Cervical disc disorder with radiculopathy of cervical region 05/02/2014   Left C8  . Diabetes mellitus without complication ([place])   . Hypertension     ALLERGIES: No Known Allergies    MEDICATIONS: I have reviewed the patient's current medications.    Current Outpatient Prescriptions on File Prior to Visit  Medication Sig Dispense Refill  . amLODipine (NORVASC) 10 MG tablet Take 10 mg by mouth daily.    . hydrochlorothiazide (HYDRODIURIL) 25 MG tablet Take 25 mg by mouth daily.     Marland Kitchen  insulin glargine (LANTUS) 100 UNIT/ML injection Inject 30 Units into the skin at bedtime.     . metoprolol succinate (TOPROL-XL) 50 MG 24 hr tablet Take 50 mg by mouth daily. Take with or immediately following a meal.     No current facility-administered medications on file prior to visit.      PAST  SURGICAL HISTORY Past Surgical History:  Procedure Laterality Date  . BIOPSY  03/20/2016   Procedure: BIOPSY;  Surgeon: Rogene Houston, MD;  Location: [facility];  Service: Endoscopy;;  colon  . BOWEL RESECTION N/A 03/23/2016   Procedure: LOW ANTERIOR RESECTION;  Surgeon: Aviva Signs, MD;  Location: [facility] OR[facility];  Service: General;  Laterality: N/A;  . COLONOSCOPY N/A 03/20/2016   Procedure: COLONOSCOPY;  Surgeon: Rogene Houston, MD;  Location: [facility];  Service: Endoscopy;  Laterality: N/A;  730  . COLONOSCOPY N/A 07/10/2016   Procedure: COLONOSCOPY;  Surgeon: Rogene Houston, MD;  Location: [facility];  Service: Endoscopy;  Laterality: N/A;  1040  . NO PAST SURGERIES    . POLYPECTOMY  07/10/2016   Procedure: POLYPECTOMY;  Surgeon: Rogene Houston, MD;  Location: [facility];  Service: Endoscopy;;  colon  . TONSILLECTOMY      FAMILY HISTORY: Family History  Problem Relation Age of Onset  . COPD Mother   . Melanoma Father    Mother deceased at the age of 78 due to complications of COPD Father deceased at the age of 57 due to metastatic melanoma.  SOCIAL HISTORY:  reports that he has never smoked. He has never used smokeless tobacco. He reports that he drinks alcohol. He reports that he does not use drugs.  He is married x 33 years.  He does not have children.  He is a Public librarian.  He is [employer].  Social History   Social History  . Marital status: Married    Spouse name: N/A  . Number of children: N/A  . Years of education: N/A   Social History Main Topics  . Smoking status: Never Smoker  . Smokeless tobacco: Never Used  . Alcohol use Yes     Comment: rarely  . Drug use: No  . Sexual activity: Not Asked   Other Topics Concern  . None   Social History Narrative  . None    PERFORMANCE STATUS: The patient's performance status is 0 - Asymptomatic  PHYSICAL EXAM: Most Recent Vital Signs: Blood pressure (!) 142/85, pulse 77, temperature 98.2 F (36.8 C),  temperature source Oral, resp. rate 18, weight 241 lb 14.4 oz (109.7 kg), SpO2 99 %. BP (!) 142/85 (BP Location: Left Arm, Patient Position: Sitting)   Pulse 77   Temp 98.2 F (36.8 C) (Oral)   Resp 18   Wt 241 lb 14.4 oz (109.7 kg)   SpO2 99%   BMI 33.74 kg/m   Physical Exam  Constitutional: He is oriented to person, place, and time and well-developed, well-nourished, and in no distress. No distress.  HENT:  Head: Normocephalic and atraumatic.  Mouth/Throat: No oropharyngeal exudate.  Eyes: Pupils are equal, round, and reactive to light. Conjunctivae are normal. No scleral icterus.  Neck: Normal range of motion. Neck supple. No JVD present.  Cardiovascular: Normal rate, regular rhythm and normal heart sounds.  Exam reveals no gallop and no friction rub.   No murmur heard. Pulmonary/Chest: Breath sounds normal. No respiratory distress. He has no wheezes. He has no rales.  Abdominal: Soft. Bowel sounds are  normal. He exhibits no distension. There is no tenderness. There is no guarding.  Musculoskeletal: He exhibits no edema or tenderness.  Lymphadenopathy:    He has no cervical adenopathy.  Neurological: He is alert and oriented to person, place, and time. No cranial nerve deficit.  Skin: Skin is warm and dry. No rash noted. No erythema. No pallor.  Psychiatric: Affect and judgment normal.    LABORATORY DATA:  No results found for this or any previous visit (from the past 48 hour(s)).   Lab Results  Component Value Date   CEA 1.6 04/08/2016     RADIOGRAPHY: CLINICAL DATA:  Bloody stool, rectal pain for 1 month.  EXAM: CT ABDOMEN AND PELVIS WITH CONTRAST  TECHNIQUE: Multidetector CT imaging of the abdomen and pelvis was performed using the standard protocol following bolus administration of intravenous contrast.  CONTRAST:  42mL ISOVUE-300 IOPAMIDOL (ISOVUE-300) INJECTION 61%, 137mL ISOVUE-300 IOPAMIDOL (ISOVUE-300) INJECTION 61%  COMPARISON:  CT scan of March 19, 2010.  FINDINGS: Lower chest: Stable 6 mm nodule is seen laterally and right lung base. This can be considered benign at this point. No other significant abnormality seen in the visualized lung bases.  Hepatobiliary: No gallstones are noted. Stable right hepatic cyst is noted.  Pancreas: Normal.  Spleen: Normal.  Adrenals/Urinary Tract: Adrenal glands appear normal. Bilateral nonobstructive nephrolithiasis is noted. No hydronephrosis is noted. No ureteral calculi are noted. Urinary bladder is unremarkable.  Stomach/Bowel: There is no evidence of bowel obstruction. Status post appendectomy. Focal sigmoid diverticulitis is noted. There is noted focal narrowing of wall thickening in this area of the sigmoid colon most likely due to inflammation, although neoplasm cannot be excluded.  Vascular/Lymphatic: No significant adenopathy is noted.  Reproductive: Mild prostatic enlargement is noted.  Other: No abnormal fluid collection is noted.  Musculoskeletal: Severe multilevel degenerative disc disease is noted in the lumbar spine.  IMPRESSION: Bilateral nonobstructive nephrolithiasis. No hydronephrosis is noted.  Stable mild prostatic enlargement.  Focal sigmoid diverticulitis is noted without abscess formation. Focal narrowing and wall thickening of the sigmoid colon is noted in this area most likely due to inflammation, but neoplasm cannot be excluded, and sigmoidoscopy is recommended for further evaluation. These results will be called to the ordering clinician or representative by the Radiologist Assistant, and communication documented in the PACS or zVision Dashboard.   Electronically Signed   By: Marijo Conception, M.D.   On: 11/20/2015 14:18    CT Chest W Contrast (Accession [id number]) (Order [id number])  Imaging  Date: 10/14/2016 Department: [facility] Released By: Brett Fairy Authorizing: Twana First, MD  Exam Information    Status Exam Begun  Exam Ended   Final [99] 10/14/2016 5:12 PM 10/14/2016 5:53 PM  PACS Images   Show images for CT Chest W Contrast  Study Result   CLINICAL DATA:  Stage IIA sigmoid colon adenocarcinoma status post resection 03/23/2016. Restaging.  EXAM: CT CHEST, ABDOMEN, AND PELVIS WITH CONTRAST  TECHNIQUE: Multidetector CT imaging of the chest, abdomen and pelvis was performed following the standard protocol during bolus administration of intravenous contrast.  CONTRAST:  146mL ISOVUE-300 IOPAMIDOL (ISOVUE-300) INJECTION 61%  COMPARISON:  03/20/2016 chest radiograph. 07/08/2016 CT abdomen/pelvis.  FINDINGS: CT CHEST FINDINGS  Cardiovascular: Normal heart size. No significant pericardial fluid/thickening. Left anterior descending coronary atherosclerosis. Great vessels are normal in course and caliber. No central pulmonary emboli.  Mediastinum/Nodes: No discrete thyroid nodules. Unremarkable esophagus. No pathologically enlarged axillary, mediastinal or hilar lymph nodes.  Lungs/Pleura:  No pneumothorax. No pleural effusion. No acute consolidative airspace disease or lung masses. Five scattered solid pulmonary nodules in both lungs, largest 6 mm in the peripheral right lower lobe (series 7/ image 94), all stable since 03/19/2010 and considered benign. No new significant pulmonary nodules. Scattered small parenchymal bands in the mid to lower lungs are compatible with mild scarring or atelectasis.  Musculoskeletal: No aggressive appearing focal osseous lesions. Mild thoracic spondylosis. Mild gynecomastia, slightly asymmetric to the right.  CT ABDOMEN PELVIS FINDINGS  Hepatobiliary: Normal liver size. Three scattered subcentimeter hypodense liver lesions are too small to characterize and are all stable since 11/20/2015 CT, considered benign. No new liver lesions. Normal gallbladder with no radiopaque cholelithiasis. No biliary ductal  dilatation.  Pancreas: Normal, with no mass or duct dilation.  Spleen: Stable borderline mild splenomegaly (craniocaudal splenic length 13.3 cm). No splenic mass.  Adrenals/Urinary Tract: Normal adrenals. At least 3 nonobstructing stones scattered in both kidneys, largest 3 mm in the interpolar right kidney and 4 mm in the upper left kidney. No hydronephrosis. A few scattered subcentimeter hypodense renal cortical lesions in both kidneys are too small to characterize and appear unchanged. No new renal lesions. Normal bladder.  Stomach/Bowel: Grossly normal stomach. Normal caliber small bowel with no small bowel wall thickening. Appendix not discretely visualized. No pericecal inflammatory changes. Stable postsurgical changes from subtotal distal colectomy with intact appearing colorectal anastomosis. No anastomotic wall thickening or discrete mass. No large bowel wall thickening or pericolonic fat stranding. Oral contrast progresses to the distal colon. Upper presacral space fat stranding is slightly decreased and compatible with postsurgical/postinflammatory change (series 5/image 117).  Vascular/Lymphatic: Atherosclerotic nonaneurysmal abdominal aorta. Patent portal, splenic, hepatic and renal veins. No pathologically enlarged lymph nodes in the abdomen or pelvis.  Reproductive: Stable moderately enlarged prostate with mass effect on the bladder base by the enlarged nodular median lobe of the prostate.  Other: No pneumoperitoneum, ascites or focal fluid collection.  Musculoskeletal: No aggressive appearing focal osseous lesions. Marked lumbar spondylosis.  IMPRESSION: 1. No evidence of local tumor recurrence at the colorectal anastomosis. 2. No evidence of metastatic disease in the chest, abdomen or pelvis . 3. Stable borderline mild splenomegaly. 4. Nonobstructing bilateral nephrolithiasis. 5. Moderately enlarged prostate. 6. Aortic Atherosclerosis  (ICD10-I70.0). One vessel coronary atherosclerosis.   Electronically Signed   By: Ilona Sorrel M.D.   On: 10/15/2016 09:43     PATHOLOGY:    Colon, segmental resection for tumor, sigmoid - INVASIVE WELL DIFFERENTIATED ADENOCARCINOMA, SPANNING 5 CM IN GREATEST DIMENSION. - TUMOR INVADES THROUGH MUSCULARIS PROPRIA TO INVOLVE SUBSEROSAL SOFT TISSUES. - UNDERLYING MARKED ACUTE AND CHRONIC INFLAMMATION WITH GIANT CELL REACTION AND ABSCESS FORMATION EXTENDING TO THE SEROSAL SURFACE AND INVOLVING THE MESENTERIC TISSUE. - MARGINS ARE NEGATIVE FOR TUMOR. - FOURTEEN BENIGN LYMPH NODES WHICH DEMONSTRATE VARIABLE INVOLVEMENT WITH ACUTE AND CHRONIC INFLAMMATION WITH ASSOCIATED GIANT CELLS WITHOUT TUMOR IDENTIFIED (0/14).  Colonoscopy 07/10/16: - Two small polyps at the splenic flexure and in the transverse colon. Biopsied. - Four small polyps in the descending colon and in the transverse colon, removed with a cold snare. Resected and retrieved. - Patent end-to-end colo-colonic anastomosis, characterized by healthy appearing mucosa. - External hemorrhoids.  Diagnosis Colon, polyp(s), transverse, splenic flexure, descending - TUBULAR ADENOMA(S). - HIGH GRADE DYSPLASIA IS NOT IDENTIFIED. 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 Arnoldo Morale on 03/23/2016 with an elevated pre-operative CEA at 13.2.  PLAN: Clinically NED. I have reviewed restaging CT  chest/abd/pelvis with contrast with the patient. He has no evidence of recurrence or metastatic disease. Plan to repeat restaging CT CAP in 6 months, will order on the next visit. He will need a repeat colonoscopy in 1 year which will be 03/2017. He will set up an appointment with Dr. Laural Golden. Refer to orthopedics for his neck pain with history of cervical disc disorder with radiculopathy of cervical region. Labs today and prior to next visit with CBC, CMP, CEA.  RTC in 3 months for follow up.    NCCN guidelines for surveillance for Colon cancer are as follows (1.2017):  B. Stage II, Stage III 1. H+P every 3-6 months x 2 years and then every 6 months for a total of 5 years  2. CEA every 3-6 months x 2 years and then every 6 months for a total of 5 years  3. CT CAP every 6-12 months (category 2B for frequency < 12 months) for a total of 5 years . 4.  Colonoscopy in 1 year except if no preoperative colonoscopy due to obstructing lesion, colonoscopy in 3-6 months.  A. If advanced adenoma, repeat in 1 year B. If no advanced adenoma, repeat in 3 years, then every 5 years 5. PET/CT scan is not recommended.    ORDERS PLACED FOR THIS ENCOUNTER: Orders Placed This Encounter  Procedures  . CBC with Differential  . Comprehensive metabolic panel  . CEA    All questions were answered. The patient knows to call the clinic with any problems, questions or concerns. We can certainly see the patient much sooner if necessary.   This note is electronically signed by: Twana First, MD 10/21/2016 3:58 PM

## 2016-10-22 LAB — CEA: CEA1: 2.4 ng/mL (ref 0.0–4.7)

## 2016-10-29 ENCOUNTER — Encounter (HOSPITAL_COMMUNITY): Payer: Self-pay | Admitting: Emergency Medicine

## 2016-11-25 ENCOUNTER — Encounter (INDEPENDENT_AMBULATORY_CARE_PROVIDER_SITE_OTHER): Payer: Self-pay | Admitting: Physical Medicine and Rehabilitation

## 2016-11-25 ENCOUNTER — Ambulatory Visit (INDEPENDENT_AMBULATORY_CARE_PROVIDER_SITE_OTHER): Payer: 59 | Admitting: Physical Medicine and Rehabilitation

## 2016-11-25 VITALS — BP 141/90 | HR 74

## 2016-11-25 DIAGNOSIS — M5412 Radiculopathy, cervical region: Secondary | ICD-10-CM

## 2016-11-25 DIAGNOSIS — M542 Cervicalgia: Secondary | ICD-10-CM | POA: Diagnosis not present

## 2016-11-25 NOTE — Progress Notes (Deleted)
Neck pain equal on both sides. Did have pain under left shoulder a few years ago. Had injections then, and that helped with the shoulder pain. Has tingling in thrid, fourth, and fifth fingers of both hands.

## 2016-12-07 ENCOUNTER — Encounter (INDEPENDENT_AMBULATORY_CARE_PROVIDER_SITE_OTHER): Payer: Self-pay | Admitting: Physical Medicine and Rehabilitation

## 2016-12-07 NOTE — Progress Notes (Signed)
Troy Smith - 61 y.o. male MRN [id number]  Date of birth: 08-08-1955  Office Visit Note: Visit Date: 11/25/2016 PCP: [name], MD Referred by: [name], MD  Subjective: Chief Complaint  Patient presents with  . Neck - Pain  . Left Hand - Numbness  . 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  He is present today with his wife who provides some of the history.  He comes in today with complaints of fairly severe at times axial neck pain bilaterally.  He gets some tingling numbness at times in both hands in the third fourth and fifth digits.  He reports the tingling and numbness are mostly with working.  Reports the axial neck pain worse with extension and rotation but can be worse at any time.  He does not necessarily relate the worsening neck pain to go along with the hand numbness.  He has had no new injuries are no new problems of the cervical spine.  He had an issue in 2016 where he had severe left scapular pain and neck pain this was resolved with 2 epidural injections by Dr. Neomia Dear.  Unfortunately we do not have her notes to review.  She did obtain an MRI at the time which is reviewed below.  This basically shows some straightening of the normal cervical lordosis with degenerative changes and facet arthropathy particularly C2 on 3 and C3 on 4 and C4 on 5.  There was some mild central canal stenosis.  No focal disc conditions.  He is not noted any weakness in the hands.  Not had any associated headaches.  He has had therapy in the remote past but none recently.  He has not had any chiropractic care.  He has been taking anti-inflammatory to some degree.  His history however is complicated by fairly recent diagnosis of adenocarcinoma of the colon but this appears to be under control.  He does have insulin-dependent diabetes without complication.  He has no history of peripheral  polyneuropathy.  He does carry a diagnosis of carpal tunnel syndrome bilaterally.  He has not had electrodiagnostic studies.    Review of Systems  Constitutional: Negative for chills, fever, malaise/fatigue and weight loss.  HENT: Negative for hearing loss and sinus pain.   Eyes: Negative for blurred vision, double vision and photophobia.  Respiratory: Negative for cough and shortness of breath.   Cardiovascular: Negative for chest pain, palpitations and leg swelling.  Gastrointestinal: Negative for abdominal pain, nausea and vomiting.  Genitourinary: Negative for flank pain.  Musculoskeletal: Positive for neck pain. Negative for myalgias.  Skin: Negative for itching and rash.  Neurological: Positive for tingling. Negative for tremors, focal weakness and weakness.  Endo/Heme/Allergies: Negative.   Psychiatric/Behavioral: Negative for depression.  All other systems reviewed and are negative.  Otherwise per HPI.  Assessment & Plan: Visit Diagnoses:  1. Cervicalgia   2. Cervical radiculopathy     Plan: Findings:  Chronic worsening bilateral neck pain which seems to be consistent with facet mediated neck pain versus myofascial pain or combination.  He does get some nondermatomal tingling and numbness in the bilateral third fourth and fifth fingers of both hands.  This would really be consistent with carpal tunnel syndrome and exam is really not consistent with a focal nerve compression.  He may be having referral pattern from the trigger points that are present versus tight scalene muscles and almost a mild brachial  plexitis.  Could not rule out a neurogenic thoracic outlet syndrome.  In terms of his neck pain I think the best approach is to regroup with a good physical therapist to look at his posterior as well as mechanical treatment and dry needling.  He does not really have symptoms that would warrant right now epidural injection although we could contemplate this.  He might benefit from  diagnostic facet joint blocks and radiofrequency ablation as well.  Neck without any red flag complaints I do not see any need to repeat imaging at this point although with the history of cancer we could look at cervical MRI but this cancer so far has not been deemed to be metastatic.  We will see how he does with physical therapy will continue to follow him.  We spoke up for over 50% of the 45-minute visit about ocular pain and carpal tunnel syndrome.  We discussed the appropriateness of electrodiagnostic studies if it made sense.  We talked about the appropriateness of cervical epidural injections in the risk.  We will get the notes from Dr. Darrick Grinder A to make sure exactly what she did with him 2 years ago.  I do think the physical therapy will be beneficial we can follow him after that to see if he needs anything more direct.    Meds & Orders: No orders of the defined types were placed in this encounter.  No orders of the defined types were placed in this encounter.   Follow-up: Return if symptoms worsen or fail to improve, for Recheck spine.   Procedures: No procedures performed  No notes on file   Clinical History: MRI CERVICAL SPINE WITHOUT CONTRAST  TECHNIQUE: Multiplanar, multisequence MR imaging of the cervical spine was performed. No intravenous contrast was administered.  COMPARISON:  None.  FINDINGS: There is straightening of the normal cervical lordosis with slight anterolisthesis of C2 on C3, C3 on C4, and C4 on C5 and slight retrolisthesis of C5 on C6. Vertebral body heights are preserved. Moderate disc space narrowing is present at C5-6 and C6-7 with mild degenerative marrow changes noted. No significant vertebral marrow edema is identified. Craniocervical junction is unremarkable. There is a punctate focus of T2 hyperintensity in the right aspect of the spinal cord at C5-6 which may reflect minimal gliosis or edema related to degenerative disc disease and stenosis.  Paraspinal soft tissues are unremarkable.  C2-3: Mild disc bulging, right greater than left uncovertebral hypertrophy, and moderate to severe right facet arthrosis result in moderate right and mild left neural foraminal stenosis without spinal stenosis.  C3-4: Disc bulging, uncovertebral spurring, and mild bilateral facet hypertrophy result in minimal bilateral neural foraminal stenosis without spinal stenosis.  C4-5: Broad-based posterior disc osteophyte complex asymmetric to the right and asymmetric right uncovertebral and facet hypertrophy result in moderate right neural foraminal stenosis without spinal stenosis.  C5-6: Broad-based posterior disc osteophyte complex and uncovertebral spurring result in severe bilateral neural foraminal stenosis and mild spinal stenosis.  C6-7: Broad-based posterior disc osteophyte complex and uncovertebral spurring result in mild spinal stenosis and severe bilateral neural foraminal stenosis.  C7-T1: Mild disc bulging and uncovertebral spurring result in moderate bilateral neural foraminal stenosis without spinal stenosis.  IMPRESSION: Moderate to severe multilevel cervical disc degeneration and facet arthrosis, worst at C5-6 and C6-7 where there is mild spinal stenosis and severe bilateral neural foraminal stenosis.   Electronically Signed   By: Logan Bores   On: 03/14/2014 09:12  He reports that he has  never smoked. He has never used smokeless tobacco. No results for input(s): HGBA1C, LABURIC in the last 8760 hours.  Objective:  VS:  HT:    WT:   BMI:     BP:(!) 141/90  HR:74bpm  TEMP: ( )  RESP:  Physical Exam  Constitutional: He is oriented to person, place, and time. He appears well-developed and well-nourished. No distress.  HENT:  Head: Normocephalic and atraumatic.  Nose: Nose normal.  Mouth/Throat: Oropharynx is clear and moist.  Eyes: Pupils are equal, round, and reactive to light. Conjunctivae are normal.    Neck: Neck supple. No tracheal deviation present.  Cardiovascular: Regular rhythm and intact distal pulses.   Pulmonary/Chest: Effort normal and breath sounds normal.  Abdominal: Soft. He exhibits no distension.  Musculoskeletal: He exhibits no deformity.  Cervical range of motion is limited with extension and rotation bilaterally.  He has good forward flexion sits with forward flexion.  He does have increased pain with extension rotation but really a negative Spurling sign bilaterally.  He has a negative Lhermitte sign.  He has a negative Hoffman's test bilaterally.  He has 2+ muscle stretch reflexes at the biceps and brachioradialis bilaterally.  He has a negative Tinel's over the elbow and wrist bilaterally.  He has intact sensation all dermatomes and peripheral nerve distributions to light touch.  He does have some positive shoulder impingement on the right more than left.  He has positive and active trigger points in the levator scapula rhomboid and cervical paraspinal.  Lymphadenopathy:    He has no cervical adenopathy.  Neurological: He is alert and oriented to person, place, and time. He exhibits normal muscle tone. Coordination normal.  Skin: Skin is warm. No rash noted.  Psychiatric: He has a normal mood and affect. His behavior is normal.  Nursing note and vitals reviewed.   Ortho Exam Imaging: No results found.  Past Medical/Family/Surgical/Social History: Medications & Allergies reviewed per EMR Patient Active Problem List   Diagnosis Date Noted  . Malignant neoplasm of colon ([place]) 06/16/2016  . Iron deficiency anemia due to chronic blood loss 04/20/2016  . 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 Blood in stool 02/28/2016  . Weight loss 02/28/2016  . Diverticulitis of sigmoid colon 02/28/2016  . Essential hypertension 11/20/2015  . Loss of weight 11/20/2015  . Guaiac positive stools 11/20/2015  . Change in stool 11/20/2015  . Carpal tunnel syndrome 05/02/2014   . Cervical disc disorder with radiculopathy of cervical region 05/02/2014   Past Medical History:  Diagnosis Date  . Adenocarcinoma of sigmoid colon ([place]) 03/23/2016  . Carpal tunnel syndrome 05/02/2014   Bilateral  . Cervical disc disorder with radiculopathy of cervical region 05/02/2014   Left C8  . Diabetes mellitus without complication ([place])   . Hypertension    Family History  Problem Relation Age of Onset  . COPD Mother   . Melanoma Father    Past Surgical History:  Procedure Laterality Date  . BIOPSY  03/20/2016   Procedure: BIOPSY;  Surgeon: Rogene Houston, MD;  Location: [facility];  Service: Endoscopy;;  colon  . BOWEL RESECTION N/A 03/23/2016   Procedure: LOW ANTERIOR RESECTION;  Surgeon: Aviva Signs, MD;  Location: [facility];  Service: General;  Laterality: N/A;  . COLONOSCOPY N/A 03/20/2016   Procedure: COLONOSCOPY;  Surgeon: Rogene Houston, MD;  Location: [facility];  Service: Endoscopy;  Laterality: N/A;  730  . COLONOSCOPY N/A 07/10/2016   Procedure: COLONOSCOPY;  Surgeon:  Rogene Houston, MD;  Location: [facility];  Service: Endoscopy;  Laterality: N/A;  1040  . NO PAST SURGERIES    . POLYPECTOMY  07/10/2016   Procedure: POLYPECTOMY;  Surgeon: Rogene Houston, MD;  Location: [facility];  Service: Endoscopy;;  colon  . TONSILLECTOMY     Social History   Occupational History  . Not on file.   Social History Main Topics  . Smoking status: Never Smoker  . Smokeless tobacco: Never Used  . Alcohol use Yes     Comment: rarely  . Drug use: No  . Sexual activity: Not on file

## 2017-01-13 ENCOUNTER — Encounter (HOSPITAL_COMMUNITY): Payer: 59 | Attending: Oncology

## 2017-01-13 DIAGNOSIS — D500 Iron deficiency anemia secondary to blood loss (chronic): Secondary | ICD-10-CM | POA: Insufficient documentation

## 2017-01-13 DIAGNOSIS — C187 Malignant neoplasm of sigmoid colon: Secondary | ICD-10-CM

## 2017-01-13 LAB — COMPREHENSIVE METABOLIC PANEL
ALBUMIN: 4.1 g/dL (ref 3.5–5.0)
ALT: 18 U/L (ref 17–63)
ANION GAP: 9 (ref 5–15)
AST: 18 U/L (ref 15–41)
Alkaline Phosphatase: 66 U/L (ref 38–126)
BUN: 18 mg/dL (ref 6–20)
CHLORIDE: 106 mmol/L (ref 101–111)
CO2: 24 mmol/L (ref 22–32)
Calcium: 9.4 mg/dL (ref 8.9–10.3)
Creatinine, Ser: 1.03 mg/dL (ref 0.61–1.24)
GFR calc Af Amer: >60 mL/min (ref >60)
GFR calc non Af Amer: >60 mL/min (ref >60)
GLUCOSE: 171 mg/dL — AB (ref 65–99)
POTASSIUM: 3.6 mmol/L (ref 3.5–5.1)
Sodium: 139 mmol/L (ref 135–145)
Total Bilirubin: 0.9 mg/dL (ref 0.3–1.2)
Total Protein: 7 g/dL (ref 6.5–8.1)

## 2017-01-13 LAB — CBC WITH DIFFERENTIAL/PLATELET
BASOS ABS: 0 10*3/uL (ref 0.0–0.1)
BASOS PCT: 0 %
EOS ABS: 0.3 10*3/uL (ref 0.0–0.7)
EOS PCT: 3 %
HCT: 37.4 % — ABNORMAL LOW (ref 39.0–52.0)
Hemoglobin: 12.3 g/dL — ABNORMAL LOW (ref 13.0–17.0)
Lymphocytes Relative: 23 %
Lymphs Abs: 1.9 10*3/uL (ref 0.7–4.0)
MCH: 27.8 pg (ref 26.0–34.0)
MCHC: 32.9 g/dL (ref 30.0–36.0)
MCV: 84.6 fL (ref 78.0–100.0)
MONO ABS: 0.6 10*3/uL (ref 0.1–1.0)
Monocytes Relative: 7 %
NEUTROS ABS: 5.7 10*3/uL (ref 1.7–7.7)
Neutrophils Relative %: 67 %
PLATELETS: 191 10*3/uL (ref 150–400)
RBC: 4.42 MIL/uL (ref 4.22–5.81)
RDW: 13.9 % (ref 11.5–15.5)
WBC: 8.5 10*3/uL (ref 4.0–10.5)

## 2017-01-14 LAB — CEA: CEA: 3.2 ng/mL (ref 0.0–4.7)

## 2017-01-20 ENCOUNTER — Encounter (HOSPITAL_BASED_OUTPATIENT_CLINIC_OR_DEPARTMENT_OTHER): Payer: 59 | Admitting: Oncology

## 2017-01-20 ENCOUNTER — Encounter (HOSPITAL_COMMUNITY): Payer: Self-pay

## 2017-01-20 VITALS — BP 145/98 | HR 81 | Temp 98.1°F | Resp 16 | Wt 243.0 lb

## 2017-01-20 DIAGNOSIS — Z85038 Personal history of other malignant neoplasm of large intestine: Secondary | ICD-10-CM

## 2017-01-20 DIAGNOSIS — C187 Malignant neoplasm of sigmoid colon: Secondary | ICD-10-CM

## 2017-01-20 NOTE — Progress Notes (Addendum)
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 Arnoldo Morale on 03/23/2016 with an elevated pre-operative CEA at 13.2.  HISTORY OF PRESENT ILLNESS:   Troy Smith is a 61 y.o. 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 Arnoldo Morale on 03/23/2016 with an elevated pre-operative CEA at 13.2.  He reports that he was seen by GI for abdominal discomfort.  He notes that CT imaging was performed.  Following CT scan, he was diagnosed with diverticulitis and started on antibiotics.  Antibiotics were completed and symptoms did not improve.  This resulted in the patient representing back to GI.  Saw Dr. [place] who recommended a colonoscopy.  Colonoscopy was performed on 03/20/2016 which demonstrated a fungating, polypoid and ulcerated partially obstructing large mass in the distal sigmoid mass was circumferential with high-grade stricture.  Mass measures 5 cm in length.  Oozing was present.  Mucosa was biopsied with cold forceps for histology.  Impression of this exam reads "malignant partially obstructing tumor in the distal colon, biopsied.  Incomplete exam."  He saw Dr. Arnoldo Morale who performed a LAR on 03/23/2016.  Pre-operative CEA was elevated at 13.2.  He had a repeat colonoscopy on 07/10/16 which was negative for malignancy.   10/14/16: CT C/A/P: negative for evidence of recurrence or metastatic disease.   INTERVAL HISTORY:  Patient presented  for follow up today. He states that he is doing well. He has not had any issues with his bowel movements or any GI bleeding. His appetite is good and he is maintaining his weight. He has some fatigue. 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 He has not been taking his oral iron daily supplement as prescribed.  Review of Systems  Constitutional: Positive for malaise/fatigue. Negative for chills, fever and weight loss.  HENT: Negative.   Eyes: Negative.   Respiratory: Negative.  Negative for cough.   Cardiovascular: Negative.  Negative for chest pain.  Gastrointestinal: Positive for constipation (occasional). Negative for blood in stool, diarrhea, melena, nausea and vomiting.  Genitourinary: Negative.   Musculoskeletal: Negative.   Skin: Negative.   Neurological: Negative.  Negative for weakness.  Endo/Heme/Allergies: Negative.   Psychiatric/Behavioral: Negative.      PAST MEDICAL HISTORY:   Past Medical History:  Diagnosis Date  . Adenocarcinoma of sigmoid colon ([place]) 03/23/2016  . Carpal tunnel syndrome 05/02/2014   Bilateral  . Cervical disc disorder with radiculopathy of cervical region 05/02/2014   Left C8  . Diabetes mellitus without complication ([place])   . Hypertension     ALLERGIES: No Known Allergies    MEDICATIONS: I have reviewed the patient's current medications.    Current Outpatient Medications on File Prior to Visit  Medication Sig Dispense Refill  . amLODipine (NORVASC) 10 MG tablet Take 10 mg by mouth daily.    . hydrochlorothiazide (HYDRODIURIL) 25 MG tablet Take 25  mg by mouth daily.     . insulin glargine (LANTUS) 100 UNIT/ML injection Inject 30 Units into the skin at bedtime.     . metoprolol succinate (TOPROL-XL) 50 MG 24 hr tablet Take 50 mg by mouth daily. Take with or immediately following a meal.     No current facility-administered medications on file prior to visit.      PAST  SURGICAL HISTORY Past Surgical History:  Procedure Laterality Date  . BIOPSY  03/20/2016   Procedure: BIOPSY;  Surgeon: Rogene Houston, MD;  Location: [facility];  Service: Endoscopy;;  colon  . BOWEL RESECTION N/A 03/23/2016   Procedure: LOW ANTERIOR RESECTION;  Surgeon: Aviva Signs, MD;  Location: [facility] ORS;  Service: General;  Laterality: N/A;  . COLONOSCOPY N/A 03/20/2016   Procedure: COLONOSCOPY;  Surgeon: Rogene Houston, MD;  Location: [facility];  Service: Endoscopy;  Laterality: N/A;  730  . COLONOSCOPY N/A 07/10/2016   Procedure: COLONOSCOPY;  Surgeon: Rogene Houston, MD;  Location: [facility];  Service: Endoscopy;  Laterality: N/A;  1040  . NO PAST SURGERIES    . POLYPECTOMY  07/10/2016   Procedure: POLYPECTOMY;  Surgeon: Rogene Houston, MD;  Location: [facility];  Service: Endoscopy;;  colon  . TONSILLECTOMY      FAMILY HISTORY: Family History  Problem Relation Age of Onset  . COPD Mother   . Melanoma Father    Mother deceased at the age of 14 due to complications of COPD Father deceased at the age of 33 due to metastatic melanoma.  SOCIAL HISTORY:  reports that  has never smoked. he has never used smokeless tobacco. He reports that he drinks alcohol. He reports that he does not use drugs.  He is married x 33 years.  He does not have children.  He is a Public librarian.  He is [employer].  Social History   Socioeconomic History  . Marital status: Married    Spouse name: Not on file  . Number of children: Not on file  . Years of education: Not on file  . Highest education level: Not on file  Social Needs  . Financial resource strain: Not on file  . Food insecurity - worry: Not on file  . Food insecurity - inability: Not on file  . Transportation needs - medical: Not on file  . Transportation needs - non-medical: Not on file  Occupational History  . Not on file  Tobacco Use  . Smoking status: Never Smoker  . Smokeless tobacco: Never Used  Substance and  Sexual Activity  . Alcohol use: Yes    Comment: rarely  . Drug use: No  . Sexual activity: Not on file  Other Topics Concern  . Not on file  Social History Narrative  . Not on file    PERFORMANCE STATUS: The patient's performance status is 0 - Asymptomatic  PHYSICAL EXAM: Most Recent Vital Signs: Blood pressure (!) 145/98, pulse 81, temperature 98.1 F (36.7 C), temperature source Oral, resp. rate 16, weight 243 lb (110.2 kg), SpO2 98 %. BP (!) 145/98 (BP Location: Left Arm, Patient Position: Sitting)   Pulse 81   Temp 98.1 F (36.7 C) (Oral)   Resp 16   Wt 243 lb (110.2 kg)   SpO2 98%   BMI 33.89 kg/m   Physical Exam  Constitutional: He is oriented to person, place, and time and well-developed, well-nourished, and in no distress. No distress.  HENT:  Head: Normocephalic and  atraumatic.  Mouth/Throat: No oropharyngeal exudate.  Eyes: Conjunctivae are normal. Pupils are equal, round, and reactive to light. No scleral icterus.  Neck: Normal range of motion. Neck supple. No JVD present.  Cardiovascular: Normal rate, regular rhythm and normal heart sounds. Exam reveals no gallop and no friction rub.  No murmur heard. Pulmonary/Chest: Breath sounds normal. No respiratory distress. He has no wheezes. He has no rales.  Abdominal: Soft. Bowel sounds are normal. He exhibits no distension. There is no tenderness. There is no guarding.  Musculoskeletal: He exhibits no edema or tenderness.  Lymphadenopathy:    He has no cervical adenopathy.  Neurological: He is alert and oriented to person, place, and time. No cranial nerve deficit.  Skin: Skin is warm and dry. No rash noted. No erythema. No pallor.  Psychiatric: Affect and judgment normal.    LABORATORY DATA:  No results found for this or any previous visit (from the past 48 hour(s)).   Lab Results  Component Value Date   CEA 1.6 04/08/2016   CBC    Component Value Date/Time   WBC 8.5 01/13/2017 1556   RBC 4.42 01/13/2017  1556   HGB 12.3 (L) 01/13/2017 1556   HCT 37.4 (L) 01/13/2017 1556   PLT 191 01/13/2017 1556   MCV 84.6 01/13/2017 1556   MCH 27.8 01/13/2017 1556   MCHC 32.9 01/13/2017 1556   RDW 13.9 01/13/2017 1556   LYMPHSABS 1.9 01/13/2017 1556   MONOABS 0.6 01/13/2017 1556   EOSABS 0.3 01/13/2017 1556   BASOSABS 0.0 01/13/2017 1556   CMP Latest Ref Rng & Units 01/13/2017 10/21/2016 10/14/2016  Glucose 65 - 99 mg/dL 171(H) 141(H) -  BUN 6 - 20 mg/dL 18 16 -  Creatinine 0.61 - 1.24 mg/dL 1.03 1.19 1.10  Sodium 135 - 145 mmol/L 139 135 -  Potassium 3.5 - 5.1 mmol/L 3.6 3.5 -  Chloride 101 - 111 mmol/L 106 100(L) -  CO2 22 - 32 mmol/L 24 26 -  Calcium 8.9 - 10.3 mg/dL 9.4 9.5 -  Total Protein 6.5 - 8.1 g/dL 7.0 7.2 -  Total Bilirubin 0.3 - 1.2 mg/dL 0.9 0.8 -  Alkaline Phos 38 - 126 U/L 66 61 -  AST 15 - 41 U/L 18 16 -  ALT 17 - 63 U/L 18 15(L) -   Results for AESON, SAWYERS (MRN [id number]) as of 01/21/2017 14:10  Ref. Range 03/20/2016 08:05 04/08/2016 15:56  CEA Latest Ref Range: 0.0 - 4.7 ng/mL 13.2 (H) 1.6   Results for FREDRIK, MOGEL (MRN [id number]) as of 01/21/2017 14:10  Ref. Range 10/21/2016 15:40 01/13/2017 15:56  CEA Latest Ref Range: 0.0 - 4.7 ng/mL 2.4 3.2  RADIOGRAPHY: CLINICAL DATA:  Bloody stool, rectal pain for 1 month.  EXAM: CT ABDOMEN AND PELVIS WITH CONTRAST  TECHNIQUE: Multidetector CT imaging of the abdomen and pelvis was performed using the standard protocol following bolus administration of intravenous contrast.  CONTRAST:  43mL ISOVUE-300 IOPAMIDOL (ISOVUE-300) INJECTION 61%, 162mL ISOVUE-300 IOPAMIDOL (ISOVUE-300) INJECTION 61%  COMPARISON:  CT scan of March 19, 2010.  FINDINGS: Lower chest: Stable 6 mm nodule is seen laterally and right lung base. This can be considered benign at this point. No other significant abnormality seen in the visualized lung bases.  Hepatobiliary: No gallstones are noted. Stable right hepatic cyst  is noted.  Pancreas: Normal.  Spleen: Normal.  Adrenals/Urinary Tract: Adrenal glands appear normal. Bilateral nonobstructive nephrolithiasis is noted. No hydronephrosis is noted. No ureteral calculi are noted. Urinary  bladder is unremarkable.  Stomach/Bowel: There is no evidence of bowel obstruction. Status post appendectomy. Focal sigmoid diverticulitis is noted. There is noted focal narrowing of wall thickening in this area of the sigmoid colon most likely due to inflammation, although neoplasm cannot be excluded.  Vascular/Lymphatic: No significant adenopathy is noted.  Reproductive: Mild prostatic enlargement is noted.  Other: No abnormal fluid collection is noted.  Musculoskeletal: Severe multilevel degenerative disc disease is noted in the lumbar spine.  IMPRESSION: Bilateral nonobstructive nephrolithiasis. No hydronephrosis is noted.  Stable mild prostatic enlargement.  Focal sigmoid diverticulitis is noted without abscess formation. Focal narrowing and wall thickening of the sigmoid colon is noted in this area most likely due to inflammation, but neoplasm cannot be excluded, and sigmoidoscopy is recommended for further evaluation. These results will be called to the ordering clinician or representative by the Radiologist Assistant, and communication documented in the PACS or zVision Dashboard.   Electronically Signed   By: Marijo Conception, M.D.   On: 11/20/2015 14:18    CT Chest W Contrast (Accession [id number]) (Order [id number])  Imaging  Date: 10/14/2016 Department: [facility] Released By: Brett Fairy Authorizing: Twana First, MD  Exam Information   Status Exam Begun  Exam Ended   Final [99] 10/14/2016 5:12 PM 10/14/2016 5:53 PM  PACS Images   Show images for CT Chest W Contrast  Study Result   CLINICAL DATA:  Stage IIA sigmoid colon adenocarcinoma status post resection 03/23/2016. Restaging.  EXAM: CT CHEST, ABDOMEN,  AND PELVIS WITH CONTRAST  TECHNIQUE: Multidetector CT imaging of the chest, abdomen and pelvis was performed following the standard protocol during bolus administration of intravenous contrast.  CONTRAST:  19mL ISOVUE-300 IOPAMIDOL (ISOVUE-300) INJECTION 61%  COMPARISON:  03/20/2016 chest radiograph. 07/08/2016 CT abdomen/pelvis.  FINDINGS: CT CHEST FINDINGS  Cardiovascular: Normal heart size. No significant pericardial fluid/thickening. Left anterior descending coronary atherosclerosis. Great vessels are normal in course and caliber. No central pulmonary emboli.  Mediastinum/Nodes: No discrete thyroid nodules. Unremarkable esophagus. No pathologically enlarged axillary, mediastinal or hilar lymph nodes.  Lungs/Pleura: No pneumothorax. No pleural effusion. No acute consolidative airspace disease or lung masses. Five scattered solid pulmonary nodules in both lungs, largest 6 mm in the peripheral right lower lobe (series 7/ image 94), all stable since 03/19/2010 and considered benign. No new significant pulmonary nodules. Scattered small parenchymal bands in the mid to lower lungs are compatible with mild scarring or atelectasis.  Musculoskeletal: No aggressive appearing focal osseous lesions. Mild thoracic spondylosis. Mild gynecomastia, slightly asymmetric to the right.  CT ABDOMEN PELVIS FINDINGS  Hepatobiliary: Normal liver size. Three scattered subcentimeter hypodense liver lesions are too small to characterize and are all stable since 11/20/2015 CT, considered benign. No new liver lesions. Normal gallbladder with no radiopaque cholelithiasis. No biliary ductal dilatation.  Pancreas: Normal, with no mass or duct dilation.  Spleen: Stable borderline mild splenomegaly (craniocaudal splenic length 13.3 cm). No splenic mass.  Adrenals/Urinary Tract: Normal adrenals. At least 3 nonobstructing stones scattered in both kidneys, largest 3 mm in the  interpolar right kidney and 4 mm in the upper left kidney. No hydronephrosis. A few scattered subcentimeter hypodense renal cortical lesions in both kidneys are too small to characterize and appear unchanged. No new renal lesions. Normal bladder.  Stomach/Bowel: Grossly normal stomach. Normal caliber small bowel with no small bowel wall thickening. Appendix not discretely visualized. No pericecal inflammatory changes. Stable postsurgical changes from subtotal distal colectomy with intact appearing colorectal anastomosis. No anastomotic  wall thickening or discrete mass. No large bowel wall thickening or pericolonic fat stranding. Oral contrast progresses to the distal colon. Upper presacral space fat stranding is slightly decreased and compatible with postsurgical/postinflammatory change (series 5/image 117).  Vascular/Lymphatic: Atherosclerotic nonaneurysmal abdominal aorta. Patent portal, splenic, hepatic and renal veins. No pathologically enlarged lymph nodes in the abdomen or pelvis.  Reproductive: Stable moderately enlarged prostate with mass effect on the bladder base by the enlarged nodular median lobe of the prostate.  Other: No pneumoperitoneum, ascites or focal fluid collection.  Musculoskeletal: No aggressive appearing focal osseous lesions. Marked lumbar spondylosis.  IMPRESSION: 1. No evidence of local tumor recurrence at the colorectal anastomosis. 2. No evidence of metastatic disease in the chest, abdomen or pelvis . 3. Stable borderline mild splenomegaly. 4. Nonobstructing bilateral nephrolithiasis. 5. Moderately enlarged prostate. 6. Aortic Atherosclerosis (ICD10-I70.0). One vessel coronary atherosclerosis.   Electronically Signed   By: Ilona Sorrel M.D.   On: 10/15/2016 09:43     PATHOLOGY:    Colon, segmental resection for tumor, sigmoid - INVASIVE WELL DIFFERENTIATED ADENOCARCINOMA, SPANNING 5 CM IN GREATEST DIMENSION. - TUMOR INVADES  THROUGH MUSCULARIS PROPRIA TO INVOLVE SUBSEROSAL SOFT TISSUES. - UNDERLYING MARKED ACUTE AND CHRONIC INFLAMMATION WITH GIANT CELL REACTION AND ABSCESS FORMATION EXTENDING TO THE SEROSAL SURFACE AND INVOLVING THE MESENTERIC TISSUE. - MARGINS ARE NEGATIVE FOR TUMOR. - FOURTEEN BENIGN LYMPH NODES WHICH DEMONSTRATE VARIABLE INVOLVEMENT WITH ACUTE AND CHRONIC INFLAMMATION WITH ASSOCIATED GIANT CELLS WITHOUT TUMOR IDENTIFIED (0/14).  Colonoscopy 07/10/16: - Two small polyps at the splenic flexure and in the transverse colon. Biopsied. - Four small polyps in the descending colon and in the transverse colon, removed with a cold snare. Resected and retrieved. - Patent end-to-end colo-colonic anastomosis, characterized by healthy appearing mucosa. - External hemorrhoids.  Diagnosis Colon, polyp(s), transverse, splenic flexure, descending - TUBULAR ADENOMA(S). - HIGH GRADE DYSPLASIA IS NOT IDENTIFIED. 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 Arnoldo Morale on 03/23/2016 with an elevated pre-operative CEA at 13.2. 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 Plan to repeat restaging CT CAP in 3 months, ordered today. He will need a repeat colonoscopy in 1 year which will be 03/2017. He will set up an appointment with Dr. Laural Golden. Reviewed his labs (CBC, CMP, CEA) with the patient in detail. His CEA has been slowly trending up, most recently 3.2 last week. Will continue to monitor. Labs today and prior to next visit with CBC, CMP, CEA.  RTC in 3 months for follow up with labs and CT CAP. Recommended for him to restart taking his oral iron and increase to BID since he is mildly anemic now. Will check iron studies on next visit. If he has continued issues with compliance, may consider giving him another dose of IV iron.   NCCN guidelines for surveillance for Colon cancer are as follows (1.2017):  B. Stage II, Stage III 1.  H+P every 3-6 months x 2 years and then every 6 months for a total of 5 years  2. CEA every 3-6 months x 2 years and then every 6 months for a total of 5 years  3. CT CAP every 6-12 months (category 2B for frequency < 12 months) for a total of 5 years . 4.  Colonoscopy in 1 year except if no preoperative colonoscopy due to obstructing lesion, colonoscopy in 3-6 months.  A. If advanced adenoma, repeat in 1 year B. If no advanced adenoma, repeat  in 3 years, then every 5 years 5. PET/CT scan is not recommended.    ORDERS PLACED FOR THIS ENCOUNTER: Orders Placed This Encounter  Procedures  . CT Abdomen Pelvis W Contrast  . CT Chest W Contrast  . CBC with Differential  . Comprehensive metabolic panel  . CEA  . Iron and TIBC  . Ferritin    All questions were answered. The patient knows to call the clinic with any problems, questions or concerns. We can certainly see the patient much sooner if necessary.   This note is electronically signed by: Twana First, MD 01/20/2017 4:00 PM

## 2017-01-20 NOTE — Patient Instructions (Signed)
[facility]  Discharge Instructions:  You were seen by Dr. Zhou today. _______________________________________________________________  Thank you for choosing [facility] at [facility] to provide your oncology and hematology care.  To afford each patient quality time with our providers, please arrive at least 15 minutes before your scheduled appointment.  You need to re-schedule your appointment if you arrive 10 or more minutes late.  We strive to give you quality time with our providers, and arriving late affects you and other patients whose appointments are after yours.  Also, if you no show three or more times for appointments you may be dismissed from the clinic.  Again, thank you for choosing [facility] at [facility]. Our hope is that these requests will allow you access to exceptional care and in a timely manner. _______________________________________________________________  If you have questions after your visit, please contact our office at [phone] between the hours of 8:30 a.m. and 5:00 p.m. Voicemails left after 4:30 p.m. will not be returned until the following business day. _______________________________________________________________  For prescription refill requests, have your pharmacy contact our office. _______________________________________________________________  Recommendations made by the consultant and any test results will be sent to your referring physician. _______________________________________________________________ 

## 2017-04-13 ENCOUNTER — Other Ambulatory Visit (HOSPITAL_COMMUNITY): Payer: Self-pay

## 2017-04-13 DIAGNOSIS — C187 Malignant neoplasm of sigmoid colon: Secondary | ICD-10-CM

## 2017-04-13 DIAGNOSIS — D500 Iron deficiency anemia secondary to blood loss (chronic): Secondary | ICD-10-CM

## 2017-04-14 ENCOUNTER — Inpatient Hospital Stay (HOSPITAL_COMMUNITY): Payer: 59 | Attending: Internal Medicine

## 2017-04-14 DIAGNOSIS — D500 Iron deficiency anemia secondary to blood loss (chronic): Secondary | ICD-10-CM

## 2017-04-14 DIAGNOSIS — C187 Malignant neoplasm of sigmoid colon: Secondary | ICD-10-CM | POA: Diagnosis present

## 2017-04-14 LAB — CBC WITH DIFFERENTIAL/PLATELET
BASOS PCT: 0 %
Basophils Absolute: 0 10*3/uL (ref 0.0–0.1)
Eosinophils Absolute: 0.2 10*3/uL (ref 0.0–0.7)
Eosinophils Relative: 2 %
HEMATOCRIT: 40.3 % (ref 39.0–52.0)
HEMOGLOBIN: 13.4 g/dL (ref 13.0–17.0)
LYMPHS PCT: 29 %
Lymphs Abs: 2.1 10*3/uL (ref 0.7–4.0)
MCH: 28 pg (ref 26.0–34.0)
MCHC: 33.3 g/dL (ref 30.0–36.0)
MCV: 84.1 fL (ref 78.0–100.0)
MONOS PCT: 6 %
Monocytes Absolute: 0.4 10*3/uL (ref 0.1–1.0)
NEUTROS ABS: 4.6 10*3/uL (ref 1.7–7.7)
NEUTROS PCT: 63 %
Platelets: 206 10*3/uL (ref 150–400)
RBC: 4.79 MIL/uL (ref 4.22–5.81)
RDW: 13.2 % (ref 11.5–15.5)
WBC: 7.4 10*3/uL (ref 4.0–10.5)

## 2017-04-14 LAB — COMPREHENSIVE METABOLIC PANEL
ALBUMIN: 4 g/dL (ref 3.5–5.0)
ALK PHOS: 69 U/L (ref 38–126)
ALT: 19 U/L (ref 17–63)
ANION GAP: 13 (ref 5–15)
AST: 17 U/L (ref 15–41)
BILIRUBIN TOTAL: 1 mg/dL (ref 0.3–1.2)
BUN: 20 mg/dL (ref 6–20)
CALCIUM: 9.9 mg/dL (ref 8.9–10.3)
CO2: 27 mmol/L (ref 22–32)
Chloride: 99 mmol/L — ABNORMAL LOW (ref 101–111)
Creatinine, Ser: 1.22 mg/dL (ref 0.61–1.24)
GFR calc Af Amer: >60 mL/min (ref >60)
GFR calc non Af Amer: >60 mL/min (ref >60)
Glucose, Bld: 204 mg/dL — ABNORMAL HIGH (ref 65–99)
Potassium: 4.2 mmol/L (ref 3.5–5.1)
Sodium: 139 mmol/L (ref 135–145)
TOTAL PROTEIN: 7.4 g/dL (ref 6.5–8.1)

## 2017-04-14 LAB — IRON AND TIBC
Iron: 84 ug/dL (ref 45–182)
Saturation Ratios: 25 % (ref 17.9–39.5)
TIBC: 336 ug/dL (ref 250–450)
UIBC: 252 ug/dL

## 2017-04-14 LAB — FERRITIN: Ferritin: 74 ng/mL (ref 24–336)

## 2017-04-15 LAB — CEA: CEA1: 3.6 ng/mL (ref 0.0–4.7)

## 2017-04-21 ENCOUNTER — Ambulatory Visit (HOSPITAL_COMMUNITY)
Admission: RE | Admit: 2017-04-21 | Discharge: 2017-04-21 | Disposition: A | Discharge status: Home / Self Care | Payer: 59 | Source: Ambulatory Visit | Attending: Oncology | Admitting: Oncology

## 2017-04-21 DIAGNOSIS — N400 Enlarged prostate without lower urinary tract symptoms: Secondary | ICD-10-CM | POA: Diagnosis not present

## 2017-04-21 DIAGNOSIS — N210 Calculus in bladder: Secondary | ICD-10-CM | POA: Diagnosis not present

## 2017-04-21 DIAGNOSIS — I2510 Atherosclerotic heart disease of native coronary artery without angina pectoris: Secondary | ICD-10-CM | POA: Insufficient documentation

## 2017-04-21 DIAGNOSIS — I700 Atherosclerosis of aorta: Secondary | ICD-10-CM | POA: Diagnosis not present

## 2017-04-21 DIAGNOSIS — C187 Malignant neoplasm of sigmoid colon: Secondary | ICD-10-CM | POA: Diagnosis not present

## 2017-04-21 DIAGNOSIS — N200 Calculus of kidney: Secondary | ICD-10-CM | POA: Diagnosis not present

## 2017-04-21 DIAGNOSIS — Z9889 Other specified postprocedural states: Secondary | ICD-10-CM | POA: Insufficient documentation

## 2017-04-21 DIAGNOSIS — R161 Splenomegaly, not elsewhere classified: Secondary | ICD-10-CM | POA: Insufficient documentation

## 2017-04-21 MED ORDER — IOPAMIDOL (ISOVUE-300) INJECTION 61%
100.0000 mL | Freq: Once | INTRAVENOUS | Status: AC | PRN
  Administered 2017-04-21: 100 mL via INTRAVENOUS

## 2017-04-28 ENCOUNTER — Ambulatory Visit (HOSPITAL_COMMUNITY): Payer: 59 | Admitting: Internal Medicine

## 2017-04-29 ENCOUNTER — Ambulatory Visit (HOSPITAL_COMMUNITY): Payer: 59 | Admitting: Hematology

## 2017-05-05 ENCOUNTER — Other Ambulatory Visit: Payer: Self-pay

## 2017-05-05 ENCOUNTER — Inpatient Hospital Stay (HOSPITAL_COMMUNITY): Payer: 59 | Admitting: Hematology

## 2017-05-05 ENCOUNTER — Encounter (HOSPITAL_COMMUNITY): Payer: Self-pay | Admitting: Hematology

## 2017-05-05 DIAGNOSIS — C187 Malignant neoplasm of sigmoid colon: Secondary | ICD-10-CM

## 2017-05-05 NOTE — Assessment & Plan Note (Signed)
1.  Stage IIA (PT3PN0) sigmoid colon adenocarcinoma(date of surgery 04/01/16): He denies any change in his bowel habits.  No bleeding per rectum was reported.  I have reviewed the results of the CEA which was within normal limits.  His LFTs were also within normal limits.  Abdominal examination was benign.  I have reviewed the results of the CT scan of the abdomen and pelvis with him in detail.  He will need a colonoscopy 1 year after diagnosis.  He will contact Dr. Osborne Casco office.  He will come back in 3 months with repeat CEA and blood work.  We will plan to repeat CT scans once every 6 months for the first 2 years followed by once a year during years 3, 4 and 5.

## 2017-05-05 NOTE — Progress Notes (Signed)
Patient Care Team: [name], MD as PCP - General (Family Medicine) Dalton-Bethea, Fabio Asa, MD as Referring Physician (Physical Medicine and Rehabilitation)  DIAGNOSIS:  Encounter Diagnosis  Name Primary?  . 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 Focal narrowing and wall thickening of the sigmoid colon is noted in this area most likely due to inflammation, but neoplasm cannot be excluded, and sigmoidoscopy is recommended for further evaluation.      03/20/2016 Procedure    Colonoscopy by Dr. Laural Golden- Malignant partially obstructing tumor in the distal sigmoid colon. Biopsied. - Incomplete exam.      03/23/2016 Pathology Results    Colon, biopsy, sigmoid mass - ADENOCARCINOMA.      03/23/2016 Procedure    Low anterior resection with transanal anastomosis      03/26/2016 Pathology Results    Colon, segmental resection for tumor, sigmoid - INVASIVE WELL DIFFERENTIATED ADENOCARCINOMA, SPANNING 5 CM IN GREATEST DIMENSION. - TUMOR INVADES THROUGH MUSCULARIS PROPRIA TO INVOLVE SUBSEROSAL SOFT TISSUES. - UNDERLYING MARKED ACUTE AND CHRONIC INFLAMMATION WITH GIANT CELL REACTION AND ABSCESS FORMATION EXTENDING TO THE SEROSAL SURFACE AND INVOLVING THE MESENTERIC TISSUE. - MARGINS ARE NEGATIVE FOR TUMOR. - FOURTEEN BENIGN LYMPH NODES WHICH DEMONSTRATE VARIABLE INVOLVEMENT WITH ACUTE AND CHRONIC INFLAMMATION WITH ASSOCIATED GIANT CELLS WITHOUT TUMOR IDENTIFIED (0/14).      04/08/2016 Cancer Staging    Cancer Staging Adenocarcinoma of sigmoid colon [facility]) Staging form: Colon and Rectum, AJCC 8th Edition - Pathologic stage from 04/01/2016: Stage IIA (pT3, pN0, cM0) - Signed by Baird Cancer, PA-C on 04/01/2016       07/09/2016 Imaging    CT abd/pelvis- No evidence of metastatic disease or other acute  findings.  Bilateral nonobstructing renal calculi and mild bilateral renal parenchymal scarring.  Stable mildly enlarged prostate gland and findings of chronic bladder outlet obstruction.       CHIEF COMPLIANT: Follow-up of colon cancer.  INTERVAL HISTORY: Troy Smith is seen today for follow-up of his stage II colon cancer.  He denies any major changes in his bowels.  He denies any bleeding per rectum or melena.  He denies any new onset pains.  He works as a Pharmacist, community in [place].  Reports no complaints.  REVIEW OF SYSTEMS:   Constitutional: Denies fevers, chills or abnormal weight loss Eyes: Denies blurriness of vision Ears, nose, mouth, throat, and face: Denies mucositis or sore throat Respiratory: Denies cough, dyspnea or wheezes Cardiovascular: Denies palpitation, chest discomfort Gastrointestinal:  Denies nausea, heartburn or change in bowel habits Skin: Denies abnormal skin rashes Lymphatics: Denies new lymphadenopathy or easy bruising Neurological:Denies numbness, tingling or new weaknesses Behavioral/Psych: Mood is stable, no new changes  Extremities: No lower extremity edema All other systems were reviewed with the patient and are negative.  I have reviewed the past medical history, past surgical history, social history and family history with the patient and they are unchanged from previous note.  ALLERGIES:  has No Known Allergies.  MEDICATIONS:  Current Outpatient Medications  Medication Sig Dispense Refill  . amLODipine (NORVASC) 10 MG tablet Take 10 mg by mouth daily.    . hydrochlorothiazide (HYDRODIURIL) 25 MG tablet Take 25 mg by mouth daily.     . insulin glargine (LANTUS) 100 UNIT/ML injection Inject 30 Units into the skin at bedtime.     Marland Kitchen  metoprolol succinate (TOPROL-XL) 50 MG 24 hr tablet Take 50 mg by mouth daily. Take with or immediately following a meal.     No current facility-administered medications for this visit.     PHYSICAL  EXAMINATION: ECOG PERFORMANCE STATUS: 0 - Asymptomatic  Vitals:   05/05/17 0827  BP: (!) 134/94  Pulse: 78  Resp: 18  Temp: 98.2 F (36.8 C)  SpO2: 98%   Filed Weights   05/05/17 0827  Weight: 242 lb 9.6 oz (110 kg)    GENERAL:alert, no distress and comfortable SKIN: skin color, texture, turgor are normal, no rashes or significant lesions EYES: normal, Conjunctiva are pink and non-injected, sclera clear ABDOMEN:abdomen soft, non-tender and normal bowel sounds MUSCULOSKELETAL:no cyanosis of digits and no clubbing   EXTREMITIES: No lower extremity edema   LABORATORY DATA:  I have reviewed the data as listed CMP Latest Ref Rng & Units 04/14/2017 01/13/2017 10/21/2016  Glucose 65 - 99 mg/dL 204(H) 171(H) 141(H)  BUN 6 - 20 mg/dL 20 18 16   Creatinine 0.61 - 1.24 mg/dL 1.22 1.03 1.19  Sodium 135 - 145 mmol/L 139 139 135  Potassium 3.5 - 5.1 mmol/L 4.2 3.6 3.5  Chloride 101 - 111 mmol/L 99(L) 106 100(L)  CO2 22 - 32 mmol/L 27 24 26   Calcium 8.9 - 10.3 mg/dL 9.9 9.4 9.5  Total Protein 6.5 - 8.1 g/dL 7.4 7.0 7.2  Total Bilirubin 0.3 - 1.2 mg/dL 1.0 0.9 0.8  Alkaline Phos 38 - 126 U/L 69 66 61  AST 15 - 41 U/L 17 18 16   ALT 17 - 63 U/L 19 18 15(L)   No results found for: CAN153   Lab Results  Component Value Date   WBC 7.4 04/14/2017   HGB 13.4 04/14/2017   HCT 40.3 04/14/2017   MCV 84.1 04/14/2017   PLT 206 04/14/2017   NEUTROABS 4.6 04/14/2017    ASSESSMENT & PLAN:  Adenocarcinoma of sigmoid colon (HCC) 1.  Stage IIA (PT3PN0) sigmoid colon adenocarcinoma(date of surgery 04/01/16): He denies any change in his bowel habits.  No bleeding per rectum was reported.  I have reviewed the results of the CEA which was within normal limits.  His LFTs were also within normal limits.  Abdominal examination was benign.  I have reviewed the results of the CT scan of the abdomen and pelvis with him in detail.  He will need a colonoscopy 1 year after diagnosis.  He will contact Dr.  Osborne Casco office.  He will come back in 3 months with repeat CEA and blood work.  We will plan to repeat CT scans once every 6 months for the first 2 years followed by once a year during years 3, 4 and 5.        Derek Jack, MD 05/05/17

## 2017-05-05 NOTE — Patient Instructions (Signed)
[facility] at [facility] Discharge Instructions  You were seen today by Dr. Katragadda    Thank you for choosing [facility] at [facility] to provide your oncology and hematology care.  To afford each patient quality time with our provider, please arrive at least 15 minutes before your scheduled appointment time.   If you have a lab appointment with the Cancer Center please come in thru the  Main Entrance and check in at the main information desk  You need to re-schedule your appointment should you arrive 10 or more minutes late.  We strive to give you quality time with our providers, and arriving late affects you and other patients whose appointments are after yours.  Also, if you no show three or more times for appointments you may be dismissed from the clinic at the providers discretion.     Again, thank you for choosing [facility].  Our hope is that these requests will decrease the amount of time that you wait before being seen by our physicians.       _____________________________________________________________  Should you have questions after your visit to [facility], please contact our office at [phone] between the hours of 8:30 a.m. and 4:30 p.m.  Voicemails left after 4:30 p.m. will not be returned until the following business day.  For prescription refill requests, have your pharmacy contact our office.       Resources For Cancer Patients and their Caregivers ? American Cancer Society: Can assist with transportation, wigs, general needs, runs Look Good Feel Better.        1-888-227-6333 ? Cancer Care: Provides financial assistance, online support groups, medication/co-pay assistance.  1-800-813-HOPE (4673) ? [facility] Assists [place] cancer patients and their families through emotional , educational and financial support.  336-427-4357 ? [place] DSS Where to  apply for food stamps, Medicaid and utility assistance. 336-342-1394 ? [facility]: Transportation to medical appointments. 336-347-2287 ? Social Security Administration: May apply for disability if have a Stage IV cancer. 336-342-7796 1-800-772-1213 ? [place] Aging, Disability and Transit Services: Assists with nutrition, care and transit needs. 336-349-2343  Cancer Center Support Programs:   > Cancer Support Group  2nd Tuesday of the month 1pm-2pm, Journey Room   > Creative Journey  3rd Tuesday of the month 1130am-1pm, Journey Room    

## 2017-07-27 ENCOUNTER — Other Ambulatory Visit (HOSPITAL_COMMUNITY): Payer: Self-pay | Admitting: *Deleted

## 2017-07-27 DIAGNOSIS — C187 Malignant neoplasm of sigmoid colon: Secondary | ICD-10-CM

## 2017-07-28 ENCOUNTER — Other Ambulatory Visit (HOSPITAL_COMMUNITY): Payer: 59

## 2017-08-04 ENCOUNTER — Ambulatory Visit (HOSPITAL_COMMUNITY): Payer: 59 | Admitting: Hematology

## 2018-06-17 ENCOUNTER — Other Ambulatory Visit: Payer: Self-pay

## 2018-06-17 ENCOUNTER — Inpatient Hospital Stay (HOSPITAL_COMMUNITY): Payer: 59 | Attending: Hematology

## 2018-06-17 DIAGNOSIS — Z79899 Other long term (current) drug therapy: Secondary | ICD-10-CM | POA: Diagnosis not present

## 2018-06-17 DIAGNOSIS — Z794 Long term (current) use of insulin: Secondary | ICD-10-CM | POA: Diagnosis not present

## 2018-06-17 DIAGNOSIS — C187 Malignant neoplasm of sigmoid colon: Secondary | ICD-10-CM | POA: Insufficient documentation

## 2018-06-17 LAB — COMPREHENSIVE METABOLIC PANEL
ALT: 18 U/L (ref 0–44)
AST: 20 U/L (ref 15–41)
Albumin: 4.2 g/dL (ref 3.5–5.0)
Alkaline Phosphatase: 50 U/L (ref 38–126)
Anion gap: 10 (ref 5–15)
BUN: 22 mg/dL (ref 8–23)
CO2: 28 mmol/L (ref 22–32)
Calcium: 10 mg/dL (ref 8.9–10.3)
Chloride: 103 mmol/L (ref 98–111)
Creatinine, Ser: 1.29 mg/dL — ABNORMAL HIGH (ref 0.61–1.24)
GFR calc Af Amer: >60 mL/min (ref >60)
GFR calc non Af Amer: 59 mL/min — ABNORMAL LOW (ref >60)
Glucose, Bld: 122 mg/dL — ABNORMAL HIGH (ref 70–99)
Potassium: 3.9 mmol/L (ref 3.5–5.1)
Sodium: 141 mmol/L (ref 135–145)
Total Bilirubin: 0.8 mg/dL (ref 0.3–1.2)
Total Protein: 7.3 g/dL (ref 6.5–8.1)

## 2018-06-17 LAB — CBC WITH DIFFERENTIAL/PLATELET
Abs Immature Granulocytes: 0.03 10*3/uL (ref 0.00–0.07)
Basophils Absolute: 0 10*3/uL (ref 0.0–0.1)
Basophils Relative: 0 %
Eosinophils Absolute: 0.2 10*3/uL (ref 0.0–0.5)
Eosinophils Relative: 3 %
HCT: 37.3 % — ABNORMAL LOW (ref 39.0–52.0)
Hemoglobin: 12.2 g/dL — ABNORMAL LOW (ref 13.0–17.0)
Immature Granulocytes: 0 %
Lymphocytes Relative: 29 %
Lymphs Abs: 2.1 10*3/uL (ref 0.7–4.0)
MCH: 28.5 pg (ref 26.0–34.0)
MCHC: 32.7 g/dL (ref 30.0–36.0)
MCV: 87.1 fL (ref 80.0–100.0)
Monocytes Absolute: 0.4 10*3/uL (ref 0.1–1.0)
Monocytes Relative: 6 %
Neutro Abs: 4.4 10*3/uL (ref 1.7–7.7)
Neutrophils Relative %: 62 %
Platelets: 200 10*3/uL (ref 150–400)
RBC: 4.28 MIL/uL (ref 4.22–5.81)
RDW: 14 % (ref 11.5–15.5)
WBC: 7.2 10*3/uL (ref 4.0–10.5)
nRBC: 0 % (ref 0.0–0.2)

## 2018-06-18 LAB — CEA: CEA: 36.7 ng/mL — ABNORMAL HIGH (ref 0.0–4.7)

## 2018-07-01 ENCOUNTER — Inpatient Hospital Stay (HOSPITAL_COMMUNITY): Payer: 59 | Admitting: Hematology

## 2018-07-01 ENCOUNTER — Encounter (HOSPITAL_COMMUNITY): Payer: Self-pay | Admitting: Hematology

## 2018-07-01 ENCOUNTER — Other Ambulatory Visit: Payer: Self-pay

## 2018-07-01 VITALS — BP 154/90 | HR 76 | Temp 97.8°F | Resp 18 | Wt 231.6 lb

## 2018-07-01 DIAGNOSIS — E119 Type 2 diabetes mellitus without complications: Secondary | ICD-10-CM | POA: Diagnosis not present

## 2018-07-01 DIAGNOSIS — I10 Essential (primary) hypertension: Secondary | ICD-10-CM | POA: Diagnosis not present

## 2018-07-01 DIAGNOSIS — C187 Malignant neoplasm of sigmoid colon: Secondary | ICD-10-CM

## 2018-07-01 NOTE — Progress Notes (Signed)
Patient Care Team: [name], MD as PCP - General (Family Medicine) Dalton-Bethea, Fabio Asa, MD as Referring Physician (Physical Medicine and Rehabilitation)  DIAGNOSIS:  Encounter Diagnosis  Name Primary?  . 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 Focal narrowing and wall thickening of the sigmoid colon is noted in this area most likely due to inflammation, but neoplasm cannot be excluded, and sigmoidoscopy is recommended for further evaluation.    03/20/2016 Procedure    Colonoscopy by Dr. Laural Golden- Malignant partially obstructing tumor in the distal sigmoid colon. Biopsied. - Incomplete exam.    03/23/2016 Pathology Results    Colon, biopsy, sigmoid mass - ADENOCARCINOMA.    03/23/2016 Procedure    Low anterior resection with transanal anastomosis    03/26/2016 Pathology Results    Colon, segmental resection for tumor, sigmoid - INVASIVE WELL DIFFERENTIATED ADENOCARCINOMA, SPANNING 5 CM IN GREATEST DIMENSION. - TUMOR INVADES THROUGH MUSCULARIS PROPRIA TO INVOLVE SUBSEROSAL SOFT TISSUES. - UNDERLYING MARKED ACUTE AND CHRONIC INFLAMMATION WITH GIANT CELL REACTION AND ABSCESS FORMATION EXTENDING TO THE SEROSAL SURFACE AND INVOLVING THE MESENTERIC TISSUE. - MARGINS ARE NEGATIVE FOR TUMOR. - FOURTEEN BENIGN LYMPH NODES WHICH DEMONSTRATE VARIABLE INVOLVEMENT WITH ACUTE AND CHRONIC INFLAMMATION WITH ASSOCIATED GIANT CELLS WITHOUT TUMOR IDENTIFIED (0/14).    04/08/2016 Cancer Staging    Cancer Staging Adenocarcinoma of sigmoid colon [facility]) Staging form: Colon and Rectum, AJCC 8th Edition - Pathologic stage from 04/01/2016: Stage IIA (pT3, pN0, cM0) - Signed by Baird Cancer, PA-C on 04/01/2016     07/09/2016 Imaging    CT abd/pelvis- No evidence of metastatic disease or other acute findings.   Bilateral nonobstructing renal calculi and mild bilateral renal parenchymal scarring.  Stable mildly enlarged prostate gland and findings of chronic bladder outlet obstruction.     CHIEF COMPLIANT: Follow-up of colon cancer.  INTERVAL HISTORY: Troy Smith is seen for follow-up of his colon cancer.  He has missed his follow-up appointment in June 2019.  Denies any nausea vomiting or diarrhea.  He does report change in bowel habits including constipation.  He occasionally sees blood streaked stools.  Denies any weight loss.  Denies any new onset pains.  Appetite has been great.  No frank bleeding per rectum.  Energy levels are reported at 100%.  No recent infections or hospitalizations.  REVIEW OF SYSTEMS:   Constitutional: Denies fevers, chills or abnormal weight loss Eyes: Denies blurriness of vision Ears, nose, mouth, throat, and face: Denies mucositis or sore throat Respiratory: Denies cough, dyspnea or wheezes Cardiovascular: Denies palpitation, chest discomfort Gastrointestinal:  Denies nausea, heartburn or change in bowel habits Skin: Denies abnormal skin rashes Lymphatics: Denies new lymphadenopathy or easy bruising Neurological:Denies numbness, tingling or new weaknesses Behavioral/Psych: Mood is stable, no new changes  Extremities: No lower extremity edema All other systems were reviewed with the patient and are negative.  I have reviewed the past medical history, past surgical history, social history and family history with the patient and they are unchanged from previous note.  ALLERGIES:  has No Known Allergies.  MEDICATIONS:  Current Outpatient Medications  Medication Sig Dispense Refill  . amLODipine (NORVASC) 10 MG tablet Take 10 mg by mouth daily.    . hydrochlorothiazide (HYDRODIURIL) 25 MG tablet Take 25 mg by mouth daily.     . insulin glargine (LANTUS) 100  UNIT/ML injection Inject 30 Units into the skin at bedtime.     . metoprolol succinate (TOPROL-XL) 50  MG 24 hr tablet Take 50 mg by mouth daily. Take with or immediately following a meal.     No current facility-administered medications for this visit.     PHYSICAL EXAMINATION: ECOG PERFORMANCE STATUS: 0 - Asymptomatic  Vitals:   07/01/18 0818  BP: (!) 154/90  Pulse: 76  Resp: 18  Temp: 97.8 F (36.6 C)  SpO2: 98%   Filed Weights   07/01/18 0818  Weight: 231 lb 9.6 oz (105.1 kg)    GENERAL:alert, no distress and comfortable SKIN: skin color, texture, turgor are normal, no rashes or significant lesions EYES: normal, Conjunctiva are pink and non-injected, sclera clear ABDOMEN:abdomen soft, non-tender and normal bowel sounds MUSCULOSKELETAL:no cyanosis of digits and no clubbing  Chest: Bilateral clear to auscultation CVS: S1-S2 regular rate and rhythm EXTREMITIES: No lower extremity edema   LABORATORY DATA:  I have reviewed the data as listed CMP Latest Ref Rng & Units 06/17/2018 04/14/2017 01/13/2017  Glucose 70 - 99 mg/dL 122(H) 204(H) 171(H)  BUN 8 - 23 mg/dL 22 20 18   Creatinine 0.61 - 1.24 mg/dL 1.29(H) 1.22 1.03  Sodium 135 - 145 mmol/L 141 139 139  Potassium 3.5 - 5.1 mmol/L 3.9 4.2 3.6  Chloride 98 - 111 mmol/L 103 99(L) 106  CO2 22 - 32 mmol/L 28 27 24   Calcium 8.9 - 10.3 mg/dL 10.0 9.9 9.4  Total Protein 6.5 - 8.1 g/dL 7.3 7.4 7.0  Total Bilirubin 0.3 - 1.2 mg/dL 0.8 1.0 0.9  Alkaline Phos 38 - 126 U/L 50 69 66  AST 15 - 41 U/L 20 17 18   ALT 0 - 44 U/L 18 19 18    No results found for: [id number]   Lab Results  Component Value Date   WBC 7.2 06/17/2018   HGB 12.2 (L) 06/17/2018   HCT 37.3 (L) 06/17/2018   MCV 87.1 06/17/2018   PLT 200 06/17/2018   NEUTROABS 4.4 06/17/2018    ASSESSMENT & PLAN:  Adenocarcinoma of sigmoid colon (HCC) 1.  Stage IIA (PT3PN0) sigmoid colon adenocarcinoma: - Status post surgical resection on 03/23/2016, well-differentiated, margins negative, MS-stable. -He had a completion colonoscopy on 07/10/2016 and had few polyps removed.  -Last seen in our clinic in March 2019 with a normal CEA level.  He missed follow-up appointments. - He reported change in bowel habits to constipation.  Occasional blood-streaked stools present. -I have reviewed his blood work.  CEA went up to 36 from 3.6 a year ago. - This is highly suggestive of local recurrence or metastatic disease.  I have recommended CT CAP with contrast as soon as possible. - If CT CAP is negative, will consider doing PET scan.  He will also be referred to Dr. Laural Golden for colonoscopy next visit.   Total time spent is 25 minutes with more than 50% of the time spent face-to-face discussing new lab findings, further plan of action and coordination of care.     Derek Jack, MD 07/01/18

## 2018-07-01 NOTE — Assessment & Plan Note (Signed)
1.  Stage IIA (PT3PN0) sigmoid colon adenocarcinoma: - Status post surgical resection on 03/23/2016, well-differentiated, margins negative, MS-stable. -He had a completion colonoscopy on 07/10/2016 and had few polyps removed. -Last seen in our clinic in March 2019 with a normal CEA level.  He missed follow-up appointments. - He reported change in bowel habits to constipation.  Occasional blood-streaked stools present. -I have reviewed his blood work.  CEA went up to 36 from 3.6 a year ago. - This is highly suggestive of local recurrence or metastatic disease.  I have recommended CT CAP with contrast as soon as possible. - If CT CAP is negative, will consider doing PET scan.  He will also be referred to Dr. Laural Golden for colonoscopy next visit.

## 2018-07-07 ENCOUNTER — Ambulatory Visit (HOSPITAL_COMMUNITY)
Admission: RE | Admit: 2018-07-07 | Discharge: 2018-07-07 | Disposition: A | Discharge status: Home / Self Care | Payer: 59 | Source: Ambulatory Visit | Attending: Hematology | Admitting: Hematology

## 2018-07-07 ENCOUNTER — Other Ambulatory Visit: Payer: Self-pay

## 2018-07-07 DIAGNOSIS — C187 Malignant neoplasm of sigmoid colon: Secondary | ICD-10-CM | POA: Diagnosis present

## 2018-07-07 MED ORDER — IOHEXOL 300 MG/ML  SOLN
100.0000 mL | Freq: Once | INTRAMUSCULAR | Status: AC | PRN
  Administered 2018-07-07: 10:00:00 100 mL via INTRAVENOUS

## 2018-07-08 ENCOUNTER — Encounter (HOSPITAL_COMMUNITY): Payer: Self-pay | Admitting: Hematology

## 2018-07-08 ENCOUNTER — Inpatient Hospital Stay (HOSPITAL_COMMUNITY): Payer: 59 | Admitting: Hematology

## 2018-07-08 VITALS — BP 137/91 | HR 85 | Temp 97.6°F | Resp 16 | Wt 221.9 lb

## 2018-07-08 DIAGNOSIS — I10 Essential (primary) hypertension: Secondary | ICD-10-CM | POA: Diagnosis not present

## 2018-07-08 DIAGNOSIS — C187 Malignant neoplasm of sigmoid colon: Secondary | ICD-10-CM

## 2018-07-08 DIAGNOSIS — E119 Type 2 diabetes mellitus without complications: Secondary | ICD-10-CM

## 2018-07-08 NOTE — Assessment & Plan Note (Signed)
1.  Metastatic colon cancer: -History of stage II (T3N0) sigmoid colon adenocarcinoma, status post surgical resection on 03/23/2016, well-differentiated, margins negative, mustache stable. - Completion colonoscopy on 07/10/2016 and had few polyps removed. -Recent CEA was found to be 36.7. -He also reported ribbonlike stools and blood-streaked stools. -We reviewed results of the CT CAP from 07/07/2018.  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  Development of locally recurrent and regional disease including a 2.2 x 2.5 cm area of soft tissue thickening along the posterior portion of the surgical site, extending into the presacral space.  8 mm perirectal node at 3 o'clock position is enlarged from 5 mm on the prior. - I have recommended doing a whole-body PET CT scan.  I have also recommended doing an MRI of the liver with and without contrast.  If there is an isolated liver metastasis, we could still aim for cure. - We will see him back after the scans to discuss further plan.

## 2018-07-08 NOTE — Patient Instructions (Addendum)
[facility] Discharge Instructions  You were seen today by Dr. Delton Coombes. He went over your recent test results. He will see you back after your scans for follow up.   Thank you for choosing [place] at [facility] to provide your oncology and hematology care.  To afford each patient quality time with our provider, please arrive at least 15 minutes before your scheduled appointment time.   If you have a lab appointment with the Center please come in thru the  Main Entrance and check in at the main information desk  You need to re-schedule your appointment should you arrive 10 or more minutes late.  We strive to give you quality time with our providers, and arriving late affects you and other patients whose appointments are after yours.  Also, if you no show three or more times for appointments you may be dismissed from the clinic at the providers discretion.     Again, thank you for choosing [facility].  Our hope is that these requests will decrease the amount of time that you wait before being seen by our physicians.       _____________________________________________________________  Should you have questions after your visit to [facility], please contact our office at [phone] between the hours of 8:00 a.m. and 4:30 p.m.  Voicemails left after 4:00 p.m. will not be returned until the following business day.  For prescription refill requests, have your pharmacy contact our office and allow 72 hours.    Cancer Center Support Programs:   > Cancer Support Group  2nd Tuesday of the month 1pm-2pm, Journey Room

## 2018-07-08 NOTE — Progress Notes (Signed)
[place], [place] [address]   CLINIC:  Medical Oncology/Hematology  PCP:  Troy Smith, [place] [address] [phone]   REASON FOR VISIT:  Follow-up for Adenocarcinoma of sigmoid colon    BRIEF ONCOLOGIC HISTORY:    Adenocarcinoma of sigmoid colon ([place])   11/20/2015 Imaging    CT abd/pelvis- Focal sigmoid diverticulitis is noted without abscess formation. Focal narrowing and wall thickening of the sigmoid colon is noted in this area most likely due to inflammation, but neoplasm cannot be excluded, and sigmoidoscopy is recommended for further evaluation.    03/20/2016 Procedure    Colonoscopy by Dr. Laural Golden- Malignant partially obstructing tumor in the distal sigmoid colon. Biopsied. - Incomplete exam.    03/23/2016 Pathology Results    Colon, biopsy, sigmoid mass - ADENOCARCINOMA.    03/23/2016 Procedure    Low anterior resection with transanal anastomosis    03/26/2016 Pathology Results    Colon, segmental resection for tumor, sigmoid - INVASIVE WELL DIFFERENTIATED ADENOCARCINOMA, SPANNING 5 CM IN GREATEST DIMENSION. - TUMOR INVADES THROUGH MUSCULARIS PROPRIA TO INVOLVE SUBSEROSAL SOFT TISSUES. - UNDERLYING MARKED ACUTE AND CHRONIC INFLAMMATION WITH GIANT CELL REACTION AND ABSCESS FORMATION EXTENDING TO THE SEROSAL SURFACE AND INVOLVING THE MESENTERIC TISSUE. - MARGINS ARE NEGATIVE FOR TUMOR. - FOURTEEN BENIGN LYMPH NODES WHICH DEMONSTRATE VARIABLE INVOLVEMENT WITH ACUTE AND CHRONIC INFLAMMATION WITH ASSOCIATED GIANT CELLS WITHOUT TUMOR IDENTIFIED (0/14).    04/08/2016 Cancer Staging    Cancer Staging Adenocarcinoma of sigmoid colon [facility]) Staging form: Colon and Rectum, AJCC 8th Edition - Pathologic stage from 04/01/2016: Stage IIA (pT3, pN0, cM0) - Signed by Baird Cancer, PA-C on 04/01/2016     07/09/2016 Imaging    CT abd/pelvis- No evidence of metastatic disease or other acute findings.   Bilateral nonobstructing renal calculi and mild bilateral renal parenchymal scarring.  Stable mildly enlarged prostate gland and findings of chronic bladder outlet obstruction.      CANCER STAGING: Cancer Staging Adenocarcinoma of sigmoid colon [facility]) Staging form: Colon and Rectum, AJCC 8th Edition - Pathologic stage from 04/01/2016: Stage IIA (pT3, pN0, cM0) - Signed by Baird Cancer, PA-C on 04/01/2016    INTERVAL HISTORY:  Troy Smith 63 y.o. male returns for routine follow-up. He is here today alone. He states that. Denies any nausea, vomiting, or diarrhea. Denies any new pains. Had not noticed any recent bleeding such as epistaxis, hematuria or hematochezia. Denies recent chest pain on exertion, shortness of breath on minimal exertion, pre-syncopal episodes, or palpitations. Denies any numbness or tingling in hands or feet. Denies any recent fevers, infections, or recent hospitalizations. Patient reports appetite at 75% and energy level at 75%.    REVIEW OF SYSTEMS:  Review of Systems  All other systems reviewed and are negative.    PAST MEDICAL/SURGICAL HISTORY:  Past Medical History:  Diagnosis Date  . Adenocarcinoma of sigmoid colon ([place]) 03/23/2016  . Carpal tunnel syndrome 05/02/2014   Bilateral  . Cervical disc disorder with radiculopathy of cervical region 05/02/2014   Left C8  . Diabetes mellitus without complication ([place])   . Hypertension    Past Surgical History:  Procedure Laterality Date  . BIOPSY  03/20/2016   Procedure: BIOPSY;  Surgeon: Rogene Houston, MD;  [facility];  Service: Endoscopy;;  colon  . BOWEL RESECTION N/A 03/23/2016   Procedure: LOW ANTERIOR RESECTION;  Surgeon: Aviva Signs, MD;  Location: [facility] ORS;  Service: General;  Laterality:  N/A;  . COLONOSCOPY N/A 03/20/2016   Procedure: COLONOSCOPY;  Surgeon: Rogene Houston, MD;  [facility];  Service: Endoscopy;  Laterality: N/A;  730  . COLONOSCOPY N/A 07/10/2016    Procedure: COLONOSCOPY;  Surgeon: Rogene Houston, MD;  Location: [facility];  Service: Endoscopy;  Laterality: N/A;  1040  . NO PAST SURGERIES    . POLYPECTOMY  07/10/2016   Procedure: POLYPECTOMY;  Surgeon: Rogene Houston, MD;  Location[facility];  Service: Endoscopy;;  colon  . TONSILLECTOMY       SOCIAL HISTORY:  Social History   Socioeconomic History  . Marital status: Married    Spouse name: Not on file  . Number of children: Not on file  . Years of education: Not on file  . Highest education level: Not on file  Occupational History  . Not on file  Social Needs  . Financial resource strain: Not on file  . Food insecurity:    Worry: Not on file    Inability: Not on file  . Transportation needs:    Medical: Not on file    Non-medical: Not on file  Tobacco Use  . Smoking status: Never Smoker  . Smokeless tobacco: Never Used  Substance and Sexual Activity  . Alcohol use: Yes    Comment: rarely  . Drug use: No  . Sexual activity: Not on file  Lifestyle  . Physical activity:    Days per week: Not on file    Minutes per session: Not on file  . Stress: Not on file  Relationships  . Social connections:    Talks on phone: Not on file    Gets together: Not on file    Attends religious service: Not on file    Active member of club or organization: Not on file    Attends meetings of clubs or organizations: Not on file    Relationship status: Not on file  . Intimate partner violence:    Fear of current or ex partner: Not on file    Emotionally abused: Not on file    Physically abused: Not on file    Forced sexual activity: Not on file  Other Topics Concern  . Not on file  Social History Narrative  . Not on file    FAMILY HISTORY:  Family History  Problem Relation Age of Onset  . COPD Mother   . Melanoma Father     CURRENT MEDICATIONS:  Outpatient Encounter Medications as of 07/08/2018  Medication Sig  . amLODipine (NORVASC) 10 MG tablet Take 10 mg  by mouth daily.  . hydrochlorothiazide (HYDRODIURIL) 25 MG tablet Take 25 mg by mouth daily.   . insulin glargine (LANTUS) 100 UNIT/ML injection Inject 30 Units into the skin at bedtime.   . metoprolol succinate (TOPROL-XL) 50 MG 24 hr tablet Take 50 mg by mouth daily. Take with or immediately following a meal.   No facility-administered encounter medications on file as of 07/08/2018.     ALLERGIES:  No Known Allergies   PHYSICAL EXAM:  ECOG Performance status: 0  Vitals:   07/08/18 0803  BP: (!) 137/91  Pulse: 85  Resp: 16  Temp: 97.6 F (36.4 C)  SpO2: 98%   Filed Weights   07/08/18 0803  Weight: 221 lb 14.4 oz (100.7 kg)    Physical Exam Vitals signs reviewed.  Constitutional:      Appearance: Normal appearance.  Cardiovascular:     Rate and Rhythm:  Normal rate and regular rhythm.     Heart sounds: Normal heart sounds.  Pulmonary:     Effort: Pulmonary effort is normal.     Breath sounds: Normal breath sounds.  Abdominal:     General: There is no distension.     Palpations: Abdomen is soft. There is no mass.  Musculoskeletal:        General: No swelling.  Skin:    General: Skin is warm.  Neurological:     General: No focal deficit present.     Mental Status: He is alert and oriented to person, place, and time.  Psychiatric:        Mood and Affect: Mood normal.        Behavior: Behavior normal.      LABORATORY DATA:  I have reviewed the labs as listed.  CBC    Component Value Date/Time   WBC 7.2 06/17/2018 1500   RBC 4.28 06/17/2018 1500   HGB 12.2 (L) 06/17/2018 1500   HCT 37.3 (L) 06/17/2018 1500   PLT 200 06/17/2018 1500   MCV 87.1 06/17/2018 1500   MCH 28.5 06/17/2018 1500   MCHC 32.7 06/17/2018 1500   RDW 14.0 06/17/2018 1500   LYMPHSABS 2.1 06/17/2018 1500   MONOABS 0.4 06/17/2018 1500   EOSABS 0.2 06/17/2018 1500   BASOSABS 0.0 06/17/2018 1500   CMP Latest Ref Rng & Units 06/17/2018 04/14/2017 01/13/2017  Glucose 70 - 99 mg/dL 122(H)  204(H) 171(H)  BUN 8 - 23 mg/dL 22 20 18   Creatinine 0.61 - 1.24 mg/dL 1.29(H) 1.22 1.03  Sodium 135 - 145 mmol/L 141 139 139  Potassium 3.5 - 5.1 mmol/L 3.9 4.2 3.6  Chloride 98 - 111 mmol/L 103 99(L) 106  CO2 22 - 32 mmol/L 28 27 24   Calcium 8.9 - 10.3 mg/dL 10.0 9.9 9.4  Total Protein 6.5 - 8.1 g/dL 7.3 7.4 7.0  Total Bilirubin 0.3 - 1.2 mg/dL 0.8 1.0 0.9  Alkaline Phos 38 - 126 U/L 50 69 66  AST 15 - 41 U/L 20 17 18   ALT 0 - 44 U/L 18 19 18        DIAGNOSTIC IMAGING:  I have independently reviewed the scans and discussed with the patient.   I have reviewed Venita Lick LPN's note and agree with the documentation.  I personally performed a face-to-face visit, made revisions and my assessment and plan is as follows.    ASSESSMENT & PLAN:   Adenocarcinoma of sigmoid colon ([place]) 1.  Metastatic colon cancer: -History of stage II (T3N0) sigmoid colon adenocarcinoma, status post surgical resection on 03/23/2016, well-differentiated, margins negative, mustache stable. - Completion colonoscopy on 07/10/2016 and had few polyps removed. -Recent CEA was found to be 36.7. -He also reported ribbonlike stools and blood-streaked stools. -We reviewed results of the CT CAP from 07/07/2018.  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  Development of locally recurrent and regional disease including a 2.2 x 2.5 cm area of soft tissue thickening along the posterior portion of the surgical site, extending into the presacral space.  8 mm perirectal node at 3 o'clock position is enlarged from 5 mm on the prior. - I have recommended doing a whole-body PET CT scan.  I have also recommended doing an MRI of the liver with and without contrast.  If there is an isolated liver metastasis, we could still aim for cure. - We will see him back after the scans to discuss further plan.  Orders placed this encounter:  Orders Placed This Encounter  Procedures  . NM PET  Image Restag (PS) Skull Base To Thigh  . MR Abdomen W [place], [place] [phone]

## 2018-07-14 ENCOUNTER — Other Ambulatory Visit: Payer: Self-pay

## 2018-07-14 ENCOUNTER — Ambulatory Visit (HOSPITAL_COMMUNITY)
Admission: RE | Admit: 2018-07-14 | Discharge: 2018-07-14 | Disposition: A | Discharge status: Home / Self Care | Payer: 59 | Source: Ambulatory Visit | Attending: Hematology | Admitting: Hematology

## 2018-07-14 DIAGNOSIS — C187 Malignant neoplasm of sigmoid colon: Secondary | ICD-10-CM | POA: Diagnosis present

## 2018-07-14 MED ORDER — GADOBUTROL 1 MMOL/ML IV SOLN
10.0000 mL | Freq: Once | INTRAVENOUS | Status: AC | PRN
  Administered 2018-07-14: 10 mL via INTRAVENOUS

## 2018-07-15 ENCOUNTER — Ambulatory Visit (HOSPITAL_COMMUNITY)
Admission: RE | Admit: 2018-07-15 | Discharge: 2018-07-15 | Disposition: A | Discharge status: Home / Self Care | Payer: 59 | Source: Ambulatory Visit | Attending: Hematology | Admitting: Hematology

## 2018-07-15 DIAGNOSIS — C187 Malignant neoplasm of sigmoid colon: Secondary | ICD-10-CM | POA: Diagnosis present

## 2018-07-15 LAB — GLUCOSE, CAPILLARY: Glucose-Capillary: 160 mg/dL — ABNORMAL HIGH (ref 70–99)

## 2018-07-15 MED ORDER — FLUDEOXYGLUCOSE F - 18 (FDG) INJECTION
11.6000 | Freq: Once | INTRAVENOUS | Status: AC | PRN
  Administered 2018-07-15: 11.6 via INTRAVENOUS

## 2018-07-19 ENCOUNTER — Encounter (HOSPITAL_COMMUNITY): Payer: Self-pay | Admitting: Hematology

## 2018-07-19 ENCOUNTER — Inpatient Hospital Stay (HOSPITAL_COMMUNITY): Payer: 59

## 2018-07-19 ENCOUNTER — Inpatient Hospital Stay (HOSPITAL_COMMUNITY): Payer: 59 | Attending: Hematology | Admitting: Hematology

## 2018-07-19 ENCOUNTER — Other Ambulatory Visit: Payer: Self-pay

## 2018-07-19 ENCOUNTER — Ambulatory Visit (HOSPITAL_COMMUNITY): Payer: 59 | Admitting: Hematology

## 2018-07-19 VITALS — BP 148/61 | HR 78 | Temp 98.6°F | Resp 16 | Wt 228.0 lb

## 2018-07-19 DIAGNOSIS — C187 Malignant neoplasm of sigmoid colon: Secondary | ICD-10-CM | POA: Insufficient documentation

## 2018-07-19 DIAGNOSIS — K769 Liver disease, unspecified: Secondary | ICD-10-CM | POA: Diagnosis not present

## 2018-07-19 DIAGNOSIS — I10 Essential (primary) hypertension: Secondary | ICD-10-CM | POA: Insufficient documentation

## 2018-07-19 DIAGNOSIS — E119 Type 2 diabetes mellitus without complications: Secondary | ICD-10-CM | POA: Insufficient documentation

## 2018-07-19 LAB — COMPREHENSIVE METABOLIC PANEL
ALT: 17 U/L (ref 0–44)
AST: 16 U/L (ref 15–41)
Albumin: 4.1 g/dL (ref 3.5–5.0)
Alkaline Phosphatase: 50 U/L (ref 38–126)
Anion gap: 12 (ref 5–15)
BUN: 26 mg/dL — ABNORMAL HIGH (ref 8–23)
CO2: 26 mmol/L (ref 22–32)
Calcium: 10.2 mg/dL (ref 8.9–10.3)
Chloride: 103 mmol/L (ref 98–111)
Creatinine, Ser: 1.47 mg/dL — ABNORMAL HIGH (ref 0.61–1.24)
GFR calc Af Amer: 58 mL/min — ABNORMAL LOW (ref >60)
GFR calc non Af Amer: 50 mL/min — ABNORMAL LOW (ref >60)
Glucose, Bld: 193 mg/dL — ABNORMAL HIGH (ref 70–99)
Potassium: 3.8 mmol/L (ref 3.5–5.1)
Sodium: 141 mmol/L (ref 135–145)
Total Bilirubin: 1.1 mg/dL (ref 0.3–1.2)
Total Protein: 7.2 g/dL (ref 6.5–8.1)

## 2018-07-19 LAB — CBC WITH DIFFERENTIAL/PLATELET
Abs Immature Granulocytes: 0.04 10*3/uL (ref 0.00–0.07)
Basophils Absolute: 0 10*3/uL (ref 0.0–0.1)
Basophils Relative: 0 %
Eosinophils Absolute: 0.3 10*3/uL (ref 0.0–0.5)
Eosinophils Relative: 3 %
HCT: 39.5 % (ref 39.0–52.0)
Hemoglobin: 13.3 g/dL (ref 13.0–17.0)
Immature Granulocytes: 0 %
Lymphocytes Relative: 18 %
Lymphs Abs: 2 10*3/uL (ref 0.7–4.0)
MCH: 28.8 pg (ref 26.0–34.0)
MCHC: 33.7 g/dL (ref 30.0–36.0)
MCV: 85.5 fL (ref 80.0–100.0)
Monocytes Absolute: 0.5 10*3/uL (ref 0.1–1.0)
Monocytes Relative: 5 %
Neutro Abs: 7.9 10*3/uL — ABNORMAL HIGH (ref 1.7–7.7)
Neutrophils Relative %: 74 %
Platelets: 236 10*3/uL (ref 150–400)
RBC: 4.62 MIL/uL (ref 4.22–5.81)
RDW: 13.6 % (ref 11.5–15.5)
WBC: 10.8 10*3/uL — ABNORMAL HIGH (ref 4.0–10.5)
nRBC: 0 % (ref 0.0–0.2)

## 2018-07-19 NOTE — Assessment & Plan Note (Addendum)
1.  Metastatic colon cancer: -History of stage II (T3N0) sigmoid colon adenocarcinoma, status post surgical resection on 03/23/2016, well-differentiated, margins negative, mustache stable. - Completion colonoscopy on 07/10/2016 and had few polyps removed. -Recent CEA was found to be 36.7. -He also reported ribbonlike stools and blood-streaked stools. -CT CAP on 07/07/2018 showed isolated segment 4B hepatic metastasis measuring 3.8 x 3.4 cm.  Involvement of locally recurrent regional disease including 2.2 x 2.5 cm area of soft tissue thickening along the posterior portion of the surgical site, extending into the presacral space.  8 mm perirectal node at 3 o'clock position is enlarged from 5 mm on the prior. - MRI of the abdomen on 07/14/2018 shows segment 4B liver lesion measuring 2.9 x 3.6 x 3.2 cm.  No other metastatic lesions seen. - PET CT scan on 07/15/2018 shows hypermetabolic segment for a liver lesion with SUV of 10.4.  Hypermetabolic him corresponding to the previously described sigmoid colon recurrence and direct extension into the presacral space with SUV of 16.4. - He reports on and off pain in the sacral area for the last 1 week. - I have called and talked to Dr. Aviva Signs.  He was recommending a referral to colorectal surgeon.  I have requested him to place a Port-A-Cath for chemotherapy administration. -I will contact Dr. Jyl Heinz at [facility] for surgical opinion. - He will require ultrasound-guided biopsy of the liver lesion to confirm stage IV disease.  I plan to send tumor for foundation 1 testing.  He will require chemotherapy in the neoadjuvant setting.

## 2018-07-19 NOTE — Progress Notes (Signed)
[place], [place] [address]   CLINIC:  Medical Oncology/Hematology  PCP:  [name], [place] [address] [phone]   REASON FOR VISIT:  Follow-up for Adenocarcinoma of sigmoid colon    BRIEF ONCOLOGIC HISTORY:    Adenocarcinoma of sigmoid colon ([place])   11/20/2015 Imaging    CT abd/pelvis- Focal sigmoid diverticulitis is noted without abscess formation. Focal narrowing and wall thickening of the sigmoid colon is noted in this area most likely due to inflammation, but neoplasm cannot be excluded, and sigmoidoscopy is recommended for further evaluation.    03/20/2016 Procedure    Colonoscopy by Dr. Laural Golden- Malignant partially obstructing tumor in the distal sigmoid colon. Biopsied. - Incomplete exam.    03/23/2016 Pathology Results    Colon, biopsy, sigmoid mass - ADENOCARCINOMA.    03/23/2016 Procedure    Low anterior resection with transanal anastomosis    03/26/2016 Pathology Results    Colon, segmental resection for tumor, sigmoid - INVASIVE WELL DIFFERENTIATED ADENOCARCINOMA, SPANNING 5 CM IN GREATEST DIMENSION. - TUMOR INVADES THROUGH MUSCULARIS PROPRIA TO INVOLVE SUBSEROSAL SOFT TISSUES. - UNDERLYING MARKED ACUTE AND CHRONIC INFLAMMATION WITH GIANT CELL REACTION AND ABSCESS FORMATION EXTENDING TO THE SEROSAL SURFACE AND INVOLVING THE MESENTERIC TISSUE. - MARGINS ARE NEGATIVE FOR TUMOR. - FOURTEEN BENIGN LYMPH NODES WHICH DEMONSTRATE VARIABLE INVOLVEMENT WITH ACUTE AND CHRONIC INFLAMMATION WITH ASSOCIATED GIANT CELLS WITHOUT TUMOR IDENTIFIED (0/14).    04/08/2016 Cancer Staging    Cancer Staging Adenocarcinoma of sigmoid colon [facility]) Staging form: Colon and Rectum, AJCC 8th Edition - Pathologic stage from 04/01/2016: Stage IIA (pT3, pN0, cM0) - Signed by Baird Cancer, PA-C on 04/01/2016     07/09/2016 Imaging    CT abd/pelvis- No evidence of metastatic disease or other acute findings.   Bilateral nonobstructing renal calculi and mild bilateral renal parenchymal scarring.  Stable mildly enlarged prostate gland and findings of chronic bladder outlet obstruction.      CANCER STAGING: Cancer Staging Adenocarcinoma of sigmoid colon [facility]) Staging form: Colon and Rectum, AJCC 8th Edition - Pathologic stage from 04/01/2016: Stage IIA (pT3, pN0, cM0) - Signed by Baird Cancer, PA-C on 04/01/2016    INTERVAL HISTORY:  Troy Smith 63 y.o. male seen for follow-up after the PET scan and MRI.  He reports on and off lower sacral pain since last visit.  He is not requiring any pain medication.  Appetite and energy levels are 100%.  Is continuing to work full-time job.  Denies any nausea or vomiting.  Reports ribbonlike stools and blood-streaked stools.  Denies any ER visits or hospitalizations.   REVIEW OF SYSTEMS:  Review of Systems  Gastrointestinal: Positive for blood in stool.  Musculoskeletal: Positive for back pain.  All other systems reviewed and are negative.    PAST MEDICAL/SURGICAL HISTORY:  Past Medical History:  Diagnosis Date  . Adenocarcinoma of sigmoid colon ([place]) 03/23/2016  . Carpal tunnel syndrome 05/02/2014   Bilateral  . Cervical disc disorder with radiculopathy of cervical region 05/02/2014   Left C8  . Diabetes mellitus without complication ([place])   . Hypertension    Past Surgical History:  Procedure Laterality Date  . BIOPSY  03/20/2016   Procedure: BIOPSY;  Surgeon: Rogene Houston, MD;  [facility];  Service: Endoscopy;;  colon  . BOWEL RESECTION N/A 03/23/2016   Procedure: LOW ANTERIOR RESECTION;  Surgeon: Aviva Signs, MD;  Location: [facility] ORS;  Service: General;  Laterality: N/A;  .  COLONOSCOPY N/A 03/20/2016   Procedure: COLONOSCOPY;  Surgeon: Rogene Houston, MD;  [facility];  Service: Endoscopy;  Laterality: N/A;  730  . COLONOSCOPY N/A 07/10/2016   Procedure: COLONOSCOPY;  Surgeon: Rogene Houston, MD;  Location[facility];  Service: Endoscopy;  Laterality: N/A;  1040  . NO PAST SURGERIES    . POLYPECTOMY  07/10/2016   Procedure: POLYPECTOMY;  Surgeon: Rogene Houston, MD;  [facility];  Service: Endoscopy;;  colon  . TONSILLECTOMY       SOCIAL HISTORY:  Social History   Socioeconomic History  . Marital status: Married    Spouse name: Not on file  . Number of children: Not on file  . Years of education: Not on file  . Highest education level: Not on file  Occupational History  . Not on file  Social Needs  . Financial resource strain: Not on file  . Food insecurity:    Worry: Not on file    Inability: Not on file  . Transportation needs:    Medical: Not on file    Non-medical: Not on file  Tobacco Use  . Smoking status: Never Smoker  . Smokeless tobacco: Never Used  Substance and Sexual Activity  . Alcohol use: Yes    Comment: rarely  . Drug use: No  . Sexual activity: Not on file  Lifestyle  . Physical activity:    Days per week: Not on file    Minutes per session: Not on file  . Stress: Not on file  Relationships  . Social connections:    Talks on phone: Not on file    Gets together: Not on file    Attends religious service: Not on file    Active member of club or organization: Not on file    Attends meetings of clubs or organizations: Not on file    Relationship status: Not on file  . Intimate partner violence:    Fear of current or ex partner: Not on file    Emotionally abused: Not on file    Physically abused: Not on file    Forced sexual activity: Not on file  Other Topics Concern  . Not on file  Social History Narrative  . Not on file    FAMILY HISTORY:  Family History  Problem Relation Age of Onset  . COPD Mother   . Melanoma Father     CURRENT MEDICATIONS:  Outpatient Encounter Medications as of 07/19/2018  Medication Sig  . amLODipine (NORVASC) 10 MG tablet Take 10 mg by mouth daily.  . hydrochlorothiazide (HYDRODIURIL) 25 MG tablet Take 25  mg by mouth daily.   . insulin glargine (LANTUS) 100 UNIT/ML injection Inject 30 Units into the skin at bedtime.   . metoprolol succinate (TOPROL-XL) 50 MG 24 hr tablet Take 50 mg by mouth daily. Take with or immediately following a meal.   No facility-administered encounter medications on file as of 07/19/2018.     ALLERGIES:  No Known Allergies   PHYSICAL EXAM:  ECOG Performance status: 0  Vitals:   07/19/18 0900  BP: (!) 148/61  Pulse: 78  Resp: 16  Temp: 98.6 F (37 C)  SpO2: 100%   Filed Weights   07/19/18 0900  Weight: 228 lb (103.4 kg)    Physical Exam Vitals signs reviewed.  Constitutional:      Appearance: Normal appearance.  Cardiovascular:     Rate and Rhythm: Normal rate and regular rhythm.  Heart sounds: Normal heart sounds.  Pulmonary:     Effort: Pulmonary effort is normal.     Breath sounds: Normal breath sounds.  Abdominal:     General: There is no distension.     Palpations: Abdomen is soft. There is no mass.  Musculoskeletal:        General: No swelling.  Skin:    General: Skin is warm.  Neurological:     General: No focal deficit present.     Mental Status: He is alert and oriented to person, place, and time.  Psychiatric:        Mood and Affect: Mood normal.        Behavior: Behavior normal.      LABORATORY DATA:  I have reviewed the labs as listed.  CBC    Component Value Date/Time   WBC 10.8 (H) 07/19/2018 1037   RBC 4.62 07/19/2018 1037   HGB 13.3 07/19/2018 1037   HCT 39.5 07/19/2018 1037   PLT 236 07/19/2018 1037   MCV 85.5 07/19/2018 1037   MCH 28.8 07/19/2018 1037   MCHC 33.7 07/19/2018 1037   RDW 13.6 07/19/2018 1037   LYMPHSABS 2.0 07/19/2018 1037   MONOABS 0.5 07/19/2018 1037   EOSABS 0.3 07/19/2018 1037   BASOSABS 0.0 07/19/2018 1037   CMP Latest Ref Rng & Units 07/19/2018 06/17/2018 04/14/2017  Glucose 70 - 99 mg/dL 193(H) 122(H) 204(H)  BUN 8 - 23 mg/dL 26(H) 22 20  Creatinine 0.61 - 1.24 mg/dL 1.47(H) 1.29(H)  1.22  Sodium 135 - 145 mmol/L 141 141 139  Potassium 3.5 - 5.1 mmol/L 3.8 3.9 4.2  Chloride 98 - 111 mmol/L 103 103 99(L)  CO2 22 - 32 mmol/L 26 28 27   Calcium 8.9 - 10.3 mg/dL 10.2 10.0 9.9  Total Protein 6.5 - 8.1 g/dL 7.2 7.3 7.4  Total Bilirubin 0.3 - 1.2 mg/dL 1.1 0.8 1.0  Alkaline Phos 38 - 126 U/L 50 50 69  AST 15 - 41 U/L 16 20 17   ALT 0 - 44 U/L 17 18 19        DIAGNOSTIC IMAGING:  I have independently reviewed the scans and discussed with the patient.   I have reviewed Venita Lick LPN's note and agree with the documentation.  I personally performed a face-to-face visit, made revisions and my assessment and plan is as follows.    ASSESSMENT & PLAN:   Adenocarcinoma of sigmoid colon ([place]) 1.  Metastatic colon cancer: -History of stage II (T3N0) sigmoid colon adenocarcinoma, status post surgical resection on 03/23/2016, well-differentiated, margins negative, mustache stable. - Completion colonoscopy on 07/10/2016 and had few polyps removed. -Recent CEA was found to be 36.7. -He also reported ribbonlike stools and blood-streaked stools. -CT CAP on 07/07/2018 showed isolated segment 4B hepatic metastasis measuring 3.8 x 3.4 cm.  Involvement of locally recurrent regional disease including 2.2 x 2.5 cm area of soft tissue thickening along the posterior portion of the surgical site, extending into the presacral space.  8 mm perirectal node at 3 o'clock position is enlarged from 5 mm on the prior. - MRI of the abdomen on 07/14/2018 shows segment 4B liver lesion measuring 2.9 x 3.6 x 3.2 cm.  No other metastatic lesions seen. - PET CT scan on 07/15/2018 shows hypermetabolic segment for a liver lesion with SUV of 10.4.  Hypermetabolic him corresponding to the previously described sigmoid colon recurrence and direct extension into the presacral space with SUV of 16.4. - He reports on and off pain  in the sacral area for the last 1 week. - I have called and talked to Dr. Aviva Signs.  He  was recommending a referral to colorectal surgeon.  I have requested him to place a Port-A-Cath for chemotherapy administration. -I will contact Dr. Jyl Heinz at [facility] for surgical opinion. - He will require ultrasound-guided biopsy of the liver lesion to confirm stage IV disease.  I plan to send tumor for foundation 1 testing.  He will require chemotherapy in the neoadjuvant setting.   Total time spent is 40 minutes with more than 50% of the time spent face-to-face discussing scan results, further management plan and coordination of care.  Orders placed this encounter:  Orders Placed This Encounter  Procedures  . [place] CORE BIOPSY (LIVER)  . CBC with Differential/Platelet  . Comprehensive metabolic panel      Derek Jack, MD [employer] [phone]

## 2018-07-19 NOTE — Patient Instructions (Addendum)
Troy Smith at [name] Discharge Instructions  You were seen today by Dr. Delton Coombes. He went over your recent lab and scan results. He would like you to have a biopsy of the liver lesion. He will see you back after your biopsy for follow up.   Thank you for choosing [place] at [facility] to provide your oncology and hematology care.  To afford each patient quality time with our provider, please arrive at least 15 minutes before your scheduled appointment time.   If you have a lab appointment with the [facility] please come in thru the  Main Entrance and check in at the main information desk  You need to re-schedule your appointment should you arrive 10 or more minutes late.  We strive to give you quality time with our providers, and arriving late affects you and other patients whose appointments are after yours.  Also, if you no show three or more times for appointments you may be dismissed from the clinic at the providers discretion.     Again, thank you for choosing [facility].  Our hope is that these requests will decrease the amount of time that you wait before being seen by our physicians.       _____________________________________________________________  Should you have questions after your visit to [facility], please contact our office at [phone] between the hours of 8:00 a.m. and 4:30 p.m.  Voicemails left after 4:00 p.m. will not be returned until the following business day.  For prescription refill requests, have your pharmacy contact our office and allow 72 hours.    Cancer Center Support Programs:   > Cancer Support Group  2nd Tuesday of the month 1pm-2pm, Journey Room

## 2018-07-19 NOTE — H&P (Signed)
Troy Smith is an 63 y.o. male.   Chief Complaint: Colon cancer HPI: Patient is a 63 year old white male status post sigmoid colon resection in 2018 who was referred to my care by Dr. Delton Coombes for Port-A-Cath placement.  He has recurrence of the colon cancer possibly in the liver and in the pelvis.  He currently only suffers from low left back and buttock pain.  He is about to undergo neoadjuvant chemotherapy.  Past Medical History:  Diagnosis Date  . 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 Carpal tunnel syndrome 05/02/2014   Bilateral  . Cervical disc disorder with radiculopathy of cervical region 05/02/2014   Left C8  . Diabetes mellitus without complication ([place])   . Hypertension     Past Surgical History:  Procedure Laterality Date  . BIOPSY  03/20/2016   Procedure: BIOPSY;  Surgeon: Rogene Houston, MD;  [facility]: [facility];  Service: Endoscopy;;  colon  . BOWEL RESECTION N/A 03/23/2016   Procedure: LOW ANTERIOR RESECTION;  Surgeon: Aviva Signs, MD;  Location: [facility];  Service: General;  Laterality: N/A;  . COLONOSCOPY N/A 03/20/2016   Procedure: COLONOSCOPY;  Surgeon: Rogene Houston, MD;  [facility];  Service: Endoscopy;  Laterality: N/A;  730  . COLONOSCOPY N/A 07/10/2016   Procedure: COLONOSCOPY;  Surgeon: Rogene Houston, MD;  Location: [facility];  Service: Endoscopy;  Laterality: N/A;  1040  . NO PAST SURGERIES    . POLYPECTOMY  07/10/2016   Procedure: POLYPECTOMY;  Surgeon: Rogene Houston, MD;  [facility];  Service: Endoscopy;;  colon  . TONSILLECTOMY      Family History  Problem Relation Age of Onset  . COPD Mother   . Melanoma Father    Social History:  reports that he has never smoked. He has never used smokeless tobacco. He reports current alcohol use. He reports that he does not use drugs.  Allergies: No Known Allergies  No medications prior to admission.    Results for orders placed or performed in visit on  07/19/18 (from the past 48 hour(s))  CBC with Differential/Platelet     Status: Abnormal   Collection Time: 07/19/18 10:37 AM  Result Value Ref Range   WBC 10.8 (H) 4.0 - 10.5 K/uL   RBC 4.62 4.22 - 5.81 MIL/uL   Hemoglobin 13.3 13.0 - 17.0 g/dL   HCT 39.5 39.0 - 52.0 %   MCV 85.5 80.0 - 100.0 fL   MCH 28.8 26.0 - 34.0 pg   MCHC 33.7 30.0 - 36.0 g/dL   RDW 13.6 11.5 - 15.5 %   Platelets 236 150 - 400 K/uL   nRBC 0.0 0.0 - 0.2 %   Neutrophils Relative % 74 %   Neutro Abs 7.9 (H) 1.7 - 7.7 K/uL   Lymphocytes Relative 18 %   Lymphs Abs 2.0 0.7 - 4.0 K/uL   Monocytes Relative 5 %   Monocytes Absolute 0.5 0.1 - 1.0 K/uL   Eosinophils Relative 3 %   Eosinophils Absolute 0.3 0.0 - 0.5 K/uL   Basophils Relative 0 %   Basophils Absolute 0.0 0.0 - 0.1 K/uL   Immature Granulocytes 0 %   Abs Immature Granulocytes 0.04 0.00 - 0.07 K/uL    Comment: Performed at [facility], [address] [place], [place] [address]  Comprehensive metabolic panel     Status: Abnormal   Collection Time: 07/19/18 10:37 AM  Result Value Ref Range   Sodium 141  135 - 145 mmol/L   Potassium 3.8 3.5 - 5.1 mmol/L   Chloride 103 98 - 111 mmol/L   CO2 26 22 - 32 mmol/L   Glucose, Bld 193 (H) 70 - 99 mg/dL   BUN 26 (H) 8 - 23 mg/dL   Creatinine, Ser 1.47 (H) 0.61 - 1.24 mg/dL   Calcium 10.2 8.9 - 10.3 mg/dL   Total Protein 7.2 6.5 - 8.1 g/dL   Albumin 4.1 3.5 - 5.0 g/dL   AST 16 15 - 41 U/L   ALT 17 0 - 44 U/L   Alkaline Phosphatase 50 38 - 126 U/L   Total Bilirubin 1.1 0.3 - 1.2 mg/dL   GFR calc non Af Amer 50 (L) >60 mL/min   GFR calc Af Amer 58 (L) >60 mL/min   Anion gap 12 5 - 15    Comment: Performed at [facility], [address], [place], [place] [address]   No results found.  Review of Systems  Constitutional: Negative.   HENT: Negative.   Eyes: Negative.   Respiratory: Negative.   Cardiovascular: Negative.   Gastrointestinal: Negative.   Genitourinary: Negative.   Musculoskeletal: Positive  for back pain.  Skin: Negative.   Neurological: Negative.   Endo/Heme/Allergies: Negative.   Psychiatric/Behavioral: Negative.     There were no vitals taken for this visit. Physical Exam  Vitals reviewed. Constitutional: He is oriented to person, place, and time. He appears well-developed and well-nourished. No distress.  HENT:  Head: Normocephalic and atraumatic.  Cardiovascular: Normal rate, regular rhythm and normal heart sounds. Exam reveals no gallop and no friction rub.  No murmur heard. Respiratory: Effort normal and breath sounds normal. No respiratory distress. He has no wheezes. He has no rales.  Neurological: He is alert and oriented to person, place, and time.  Skin: Skin is warm and dry.     Assessment/Plan Impression: Recurrent colon carcinoma Plan: Patient will undergo Port-A-Cath insertion on 07/29/2018.  The risks and benefits of the procedure including bleeding, infection, and pneumothorax were fully explained to the patient, who gave informed consent.  Aviva Signs, MD 07/19/2018, 4:03 PM

## 2018-07-20 ENCOUNTER — Other Ambulatory Visit (HOSPITAL_COMMUNITY): Payer: Self-pay | Admitting: *Deleted

## 2018-07-20 DIAGNOSIS — C187 Malignant neoplasm of sigmoid colon: Secondary | ICD-10-CM

## 2018-07-20 DIAGNOSIS — K769 Liver disease, unspecified: Secondary | ICD-10-CM

## 2018-07-20 NOTE — Patient Instructions (Signed)
Your procedure is scheduled on: 07/29/2018  Report to [facility] at 6:15     AM.  Call this number if you have problems the morning of surgery: [phone]   Remember:   Do not Eat or Drink after midnight   :  Take these medicines the morning of surgery with A SIP OF WATER: Amlodipine and Metoprolol   Do not wear jewelry, make-up or nail polish.  Do not wear lotions, powders, or perfumes. You may wear deodorant.  Do not shave 48 hours prior to surgery. Men may shave face and neck.  Do not bring valuables to the hospital.  Contacts, dentures or bridgework may not be worn into surgery.  Leave suitcase in the car. After surgery it may be brought to your room.  For patients admitted to the hospital, checkout time is 11:00 AM the day of discharge.   Patients discharged the day of surgery will not be allowed to drive home.    Special Instructions: Shower using CHG night before surgery and shower the day of surgery use CHG.  Use special wash - you have one bottle of CHG for all showers.  You should use approximately 1/2 of the bottle for each shower.  Implanted Port Insertion, Care After This sheet gives you information about how to care for yourself after your procedure. Your health care provider may also give you more specific instructions. If you have problems or questions, contact your health care provider. What can I expect after the procedure? After the procedure, it is common to have:  Discomfort at the port insertion site.  Bruising on the skin over the port. This should improve over 3-4 days. Follow these instructions at home: [facility] care  After your port is placed, you will get a manufacturer's information card. The card has information about your port. Keep this card with you at all times.  Take care of the port as told by your health care provider. Ask your health care provider if you or a family member can get training for taking care of the port at home. A home health care  nurse may also take care of the port.  Make sure to remember what type of port you have. Incision care      Follow instructions from your health care provider about how to take care of your port insertion site. Make sure you: ? Wash your hands with soap and water before and after you change your bandage (dressing). If soap and water are not available, use hand sanitizer. ? Change your dressing as told by your health care provider. ? Leave stitches (sutures), skin glue, or adhesive strips in place. These skin closures may need to stay in place for 2 weeks or longer. If adhesive strip edges start to loosen and curl up, you may trim the loose edges. Do not remove adhesive strips completely unless your health care provider tells you to do that.  Check your port insertion site every day for signs of infection. Check for: ? Redness, swelling, or pain. ? Fluid or blood. ? Warmth. ? Pus or a bad smell. Activity  Return to your normal activities as told by your health care provider. Ask your health care provider what activities are safe for you.  Do not lift anything that is heavier than 10 lb (4.5 kg), or the limit that you are told, until your health care provider says that it is safe. General instructions  Take over-the-counter and prescription medicines only as told  by your health care provider.  Do not take baths, swim, or use a hot tub until your health care provider approves. Ask your health care provider if you may take showers. You may only be allowed to take sponge baths.  Do not drive for 24 hours if you were given a sedative during your procedure.  Wear a medical alert bracelet in case of an emergency. This will tell any health care providers that you have a port.  Keep all follow-up visits as told by your health care provider. This is important. Contact a health care provider if:  You cannot flush your port with saline as directed, or you cannot draw blood from the port.   You have a fever or chills.  You have redness, swelling, or pain around your port insertion site.  You have fluid or blood coming from your port insertion site.  Your port insertion site feels warm to the touch.  You have pus or a bad smell coming from the port insertion site. Get help right away if:  You have chest pain or shortness of breath.  You have bleeding from your port that you cannot control. Summary  Take care of the port as told by your health care provider. Keep the manufacturer's information card with you at all times.  Change your dressing as told by your health care provider.  Contact a health care provider if you have a fever or chills or if you have redness, swelling, or pain around your port insertion site.  Keep all follow-up visits as told by your health care provider. This information is not intended to replace advice given to you by your health care provider. Make sure you discuss any questions you have with your health care provider. Document Released: 11/16/2012 Document Revised: 08/24/2017 Document Reviewed: 08/24/2017 Elsevier Interactive Patient Education  2019 [place], Care After These instructions provide you with information about caring for yourself after your procedure. Your health care provider may also give you more specific instructions. Your treatment has been planned according to current medical practices, but problems sometimes occur. Call your health care provider if you have any problems or questions after your procedure. What can I expect after the procedure? After your procedure, you may:  Feel sleepy for several hours.  Feel clumsy and have poor balance for several hours.  Feel forgetful about what happened after the procedure.  Have poor judgment for several hours.  Feel nauseous or vomit.  Have a sore throat if you had a breathing tube during the procedure. Follow these instructions at home:  For at least 24 hours after the procedure:      Have a responsible adult stay with you. It is important to have someone help care for you until you are awake and alert.  Rest as needed.  Do not: ? Participate in activities in which you could fall or become injured. ? Drive. ? Use heavy machinery. ? Drink alcohol. ? Take sleeping pills or medicines that cause drowsiness. ? Make important decisions or sign legal documents. ? Take care of children on your own. Eating and drinking  Follow the diet that is recommended by your health care provider.  If you vomit, drink water, juice, or soup when you can drink without vomiting.  Make sure you have little or no nausea before eating solid foods. General instructions  Take over-the-counter and prescription medicines only as told by your health care provider.  If you have sleep  apnea, surgery and certain medicines can increase your risk for breathing problems. Follow instructions from your health care provider about wearing your sleep device: ? Anytime you are sleeping, including during daytime naps. ? While taking prescription pain medicines, sleeping medicines, or medicines that make you drowsy.  If you smoke, do not smoke without supervision.  Keep all follow-up visits as told by your health care provider. This is important. Contact a health care provider if:  You keep feeling nauseous or you keep vomiting.  You feel light-headed.  You develop a rash.  You have a fever. Get help right away if:  You have trouble breathing. Summary  For several hours after your procedure, you may feel sleepy and have poor judgment.  Have a responsible adult stay with you for at least 24 hours or until you are awake and alert. This information is not intended to replace advice given to you by your health care provider. Make sure you discuss any questions you have with your health care provider. Document Released: 05/19/2015 Document  Revised: 09/11/2016 Document Reviewed: 05/19/2015 Elsevier Interactive Patient Education  2019 [employer].

## 2018-07-26 ENCOUNTER — Inpatient Hospital Stay (HOSPITAL_COMMUNITY)
Admission: RE | Admit: 2018-07-26 | Discharge: 2018-07-26 | Disposition: A | Discharge status: Home / Self Care | Payer: 59 | Source: Ambulatory Visit

## 2018-07-26 ENCOUNTER — Ambulatory Visit: Payer: 59 | Admitting: General Surgery

## 2018-07-26 ENCOUNTER — Other Ambulatory Visit (HOSPITAL_COMMUNITY): Payer: 59

## 2018-07-29 ENCOUNTER — Ambulatory Visit (HOSPITAL_COMMUNITY): Payer: 59

## 2018-07-29 DIAGNOSIS — E119 Type 2 diabetes mellitus without complications: Secondary | ICD-10-CM | POA: Insufficient documentation

## 2018-08-01 ENCOUNTER — Encounter (HOSPITAL_COMMUNITY)
Admission: RE | Admit: 2018-08-01 | Discharge: 2018-08-01 | Disposition: A | Discharge status: Home / Self Care | Payer: 59 | Source: Ambulatory Visit | Attending: General Surgery | Admitting: General Surgery

## 2018-08-01 ENCOUNTER — Encounter (HOSPITAL_COMMUNITY): Payer: Self-pay

## 2018-08-01 ENCOUNTER — Other Ambulatory Visit: Payer: Self-pay

## 2018-08-02 ENCOUNTER — Other Ambulatory Visit (HOSPITAL_COMMUNITY)
Admission: RE | Admit: 2018-08-02 | Discharge: 2018-08-02 | Disposition: A | Discharge status: Home / Self Care | Payer: 59 | Source: Ambulatory Visit | Attending: General Surgery | Admitting: General Surgery

## 2018-08-02 DIAGNOSIS — Z1159 Encounter for screening for other viral diseases: Secondary | ICD-10-CM | POA: Insufficient documentation

## 2018-08-03 ENCOUNTER — Ambulatory Visit (HOSPITAL_COMMUNITY): Payer: 59 | Admitting: Hematology

## 2018-08-03 ENCOUNTER — Other Ambulatory Visit: Payer: Self-pay | Admitting: Radiology

## 2018-08-03 LAB — NOVEL CORONAVIRUS, NAA (HOSP ORDER, SEND-OUT TO REF LAB; TAT 18-24 HRS): SARS-CoV-2, NAA: NOT DETECTED

## 2018-08-04 ENCOUNTER — Other Ambulatory Visit (HOSPITAL_COMMUNITY): Payer: Self-pay | Admitting: Hematology

## 2018-08-04 ENCOUNTER — Encounter (HOSPITAL_COMMUNITY): Payer: Self-pay

## 2018-08-04 ENCOUNTER — Other Ambulatory Visit: Payer: Self-pay

## 2018-08-04 ENCOUNTER — Ambulatory Visit (HOSPITAL_COMMUNITY)
Admission: RE | Admit: 2018-08-04 | Discharge: 2018-08-04 | Disposition: A | Discharge status: Home / Self Care | Payer: 59 | Source: Ambulatory Visit | Attending: Hematology | Admitting: Hematology

## 2018-08-04 ENCOUNTER — Ambulatory Visit (HOSPITAL_COMMUNITY)
Admission: RE | Admit: 2018-08-04 | Discharge: 2018-08-04 | Disposition: A | Discharge status: Home / Self Care | Payer: 59 | Source: Ambulatory Visit | Attending: Student | Admitting: Student

## 2018-08-04 DIAGNOSIS — Z85038 Personal history of other malignant neoplasm of large intestine: Secondary | ICD-10-CM | POA: Diagnosis not present

## 2018-08-04 DIAGNOSIS — Z794 Long term (current) use of insulin: Secondary | ICD-10-CM | POA: Insufficient documentation

## 2018-08-04 DIAGNOSIS — I10 Essential (primary) hypertension: Secondary | ICD-10-CM | POA: Diagnosis not present

## 2018-08-04 DIAGNOSIS — K769 Liver disease, unspecified: Secondary | ICD-10-CM | POA: Diagnosis present

## 2018-08-04 DIAGNOSIS — I700 Atherosclerosis of aorta: Secondary | ICD-10-CM | POA: Insufficient documentation

## 2018-08-04 DIAGNOSIS — Z79899 Other long term (current) drug therapy: Secondary | ICD-10-CM | POA: Insufficient documentation

## 2018-08-04 DIAGNOSIS — C187 Malignant neoplasm of sigmoid colon: Secondary | ICD-10-CM

## 2018-08-04 DIAGNOSIS — Z808 Family history of malignant neoplasm of other organs or systems: Secondary | ICD-10-CM | POA: Diagnosis not present

## 2018-08-04 DIAGNOSIS — E119 Type 2 diabetes mellitus without complications: Secondary | ICD-10-CM | POA: Diagnosis not present

## 2018-08-04 LAB — CBC
HCT: 40.6 % (ref 39.0–52.0)
Hemoglobin: 13.5 g/dL (ref 13.0–17.0)
MCH: 28.2 pg (ref 26.0–34.0)
MCHC: 33.3 g/dL (ref 30.0–36.0)
MCV: 84.9 fL (ref 80.0–100.0)
Platelets: 241 10*3/uL (ref 150–400)
RBC: 4.78 MIL/uL (ref 4.22–5.81)
RDW: 13.5 % (ref 11.5–15.5)
WBC: 9.6 10*3/uL (ref 4.0–10.5)
nRBC: 0 % (ref 0.0–0.2)

## 2018-08-04 LAB — PROTIME-INR
INR: 0.9 (ref 0.8–1.2)
Prothrombin Time: 12.5 seconds (ref 11.4–15.2)

## 2018-08-04 LAB — APTT: aPTT: 29 seconds (ref 24–36)

## 2018-08-04 MED ORDER — FENTANYL CITRATE (PF) 100 MCG/2ML IJ SOLN
INTRAMUSCULAR | Status: AC
  Filled 2018-08-04: qty 2

## 2018-08-04 MED ORDER — LIDOCAINE HCL 1 % IJ SOLN
INTRAMUSCULAR | Status: AC
  Filled 2018-08-04: qty 20

## 2018-08-04 MED ORDER — NALOXONE HCL 0.4 MG/ML IJ SOLN
INTRAMUSCULAR | Status: AC
  Filled 2018-08-04: qty 1

## 2018-08-04 MED ORDER — HYDROCODONE-ACETAMINOPHEN 5-325 MG PO TABS: 1.0000 | ORAL_TABLET | ORAL | Status: DC | PRN

## 2018-08-04 MED ORDER — MIDAZOLAM HCL 2 MG/2ML IJ SOLN
INTRAMUSCULAR | Status: AC | PRN
  Administered 2018-08-04: 1 mg via INTRAVENOUS

## 2018-08-04 MED ORDER — FENTANYL CITRATE (PF) 100 MCG/2ML IJ SOLN
INTRAMUSCULAR | Status: AC | PRN
  Administered 2018-08-04 (×2): 50 ug via INTRAVENOUS

## 2018-08-04 MED ORDER — MIDAZOLAM HCL 2 MG/2ML IJ SOLN
INTRAMUSCULAR | Status: AC
  Filled 2018-08-04: qty 2

## 2018-08-04 MED ORDER — SODIUM CHLORIDE 0.9 % IV SOLN
INTRAVENOUS | Status: DC
  Administered 2018-08-04: 12:00:00 via INTRAVENOUS

## 2018-08-04 MED ORDER — FENTANYL CITRATE (PF) 100 MCG/2ML IJ SOLN
INTRAMUSCULAR | Status: AC | PRN
  Administered 2018-08-04: 50 ug via INTRAVENOUS

## 2018-08-04 MED ORDER — FLUMAZENIL 0.5 MG/5ML IV SOLN
INTRAVENOUS | Status: AC
  Filled 2018-08-04: qty 5

## 2018-08-04 NOTE — Sedation Documentation (Signed)
Unable to Ultrasound patient.  Patient moved to IR holding Room 7.  Waiting for CT room availability.

## 2018-08-04 NOTE — Discharge Instructions (Signed)
Liver Biopsy, Care After °These instructions give you information on caring for yourself after your procedure. Your doctor may also give you more specific instructions. Call your doctor if you have any problems or questions after your procedure. °What can I expect after the procedure? °After the procedure, it is common to have: °· Pain and soreness where the biopsy was done. °· Bruising around the area where the biopsy was done. °· Sleepiness and be tired for a few days. °Follow these instructions at home: °Medicines °· Take over-the-counter and prescription medicines only as told by your doctor. °· If you were prescribed an antibiotic medicine, take it as told by your doctor. Do not stop taking the antibiotic even if you start to feel better. °· Do not take medicines such as aspirin and ibuprofen. These medicines can thin your blood. Do not take these medicines unless your doctor tells you to take them. °· If you are taking prescription pain medicine, take actions to prevent or treat constipation. Your doctor may recommend that you: °? Drink enough fluid to keep your pee (urine) clear or pale yellow. °? Take over-the-counter or prescription medicines. °? Eat foods that are high in fiber, such as fresh fruits and vegetables, whole grains, and beans. °? Limit foods that are high in fat and processed sugars, such as fried and sweet foods. °Caring for your cut °· Follow instructions from your doctor about how to take care of your cuts from surgery (incisions). Make sure you: °? Wash your hands with soap and water before you change your bandage (dressing). If you cannot use soap and water, use hand sanitizer. °? Change your bandage as told by your doctor. °? Leave stitches (sutures), skin glue, or skin tape (adhesive) strips in place. They may need to stay in place for 2 weeks or longer. If tape strips get loose and curl up, you may trim the loose edges. Do not remove tape strips completely unless your doctor says it is  okay. °· Check your cuts every day for signs of infection. Check for: °? Redness, swelling, or more pain. °? Fluid or blood. °? Pus or a bad smell. °? Warmth. °· Do not take baths, swim, or use a hot tub until your doctor says it is okay to do so. °Activity ° °· Rest at home for 1-2 days or as told by your doctor. °? Avoid sitting for a long time without moving. Get up to take short walks every 1-2 hours. °· Return to your normal activities as told by your doctor. Ask what activities are safe for you. °· Do not do these things in the first 24 hours: °? Drive. °? Use machinery. °? Take a bath or shower. °· Do not lift more than 10 pounds (4.5 kg) or play contact sports for the first 2 weeks. °General instructions ° °· Do not drink alcohol in the first week after the procedure. °· Have someone stay with you for at least 24 hours after the procedure. °· Get your test results. Ask your doctor or the department that is doing the test: °? When will my results be ready? °? How will I get my results? °? What are my treatment options? °? What other tests do I need? °? What are my next steps? °· Keep all follow-up visits as told by your doctor. This is important. °Contact a doctor if: °· A cut bleeds and leaves more than just a small spot of blood. °· A cut is red, puffs up (  swells), or hurts more than before. °· Fluid or something else comes from a cut. °· A cut smells bad. °· You have a fever or chills. °Get help right away if: °· You have swelling, bloating, or pain in your belly (abdomen). °· You get dizzy or faint. °· You have a rash. °· You feel sick to your stomach (nauseous) or throw up (vomit). °· You have trouble breathing, feel short of breath, or feel faint. °· Your chest hurts. °· You have problems talking or seeing. °· You have trouble with your balance or moving your arms or legs. °Summary °· After the procedure, it is common to have pain, soreness, bruising, and tiredness. °· Your doctor will tell you how to  take care of yourself at home. Change your bandage, take your medicines, and limit your activities as told by your doctor. °· Call your doctor if you have symptoms of infection. Get help right away if your belly swells, your cut bleeds a lot, or you have trouble talking or breathing. °This information is not intended to replace advice given to you by your health care provider. Make sure you discuss any questions you have with your health care provider. °Document Released: 11/05/2007 Document Revised: 02/05/2017 Document Reviewed: 02/05/2017 °Elsevier Interactive Patient Education © 2019 Else[employer] Inc[employer] °Moderate Conscious Sedation, Adult, Care After °These instructions provide you with information about caring for yourself after your procedure. Your health care provider may also give you more specific instructions. Your treatment has been planned according to current medical practices, but problems sometimes occur. Call your health care provider if you have any problems or questions after your procedure. °What can I expect after the procedure? °After your procedure, it is common: °· To feel sleepy for several hours. °· To feel clumsy and have poor balance for several hours. °· To have poor judgment for several hours. °· To vomit if you eat too soon. °Follow these instructions at home: °For at least 24 hours after the procedure: ° °· Do not: °? Participate in activities where you could fall or become injured. °? Drive. °? Use heavy machinery. °? Drink alcohol. °? Take sleeping pills or medicines that cause drowsiness. °? Make important decisions or sign legal documents. °? Take care of children on your own. °· Rest. °Eating and drinking °· Follow the diet recommended by your health care provider. °· If you vomit: °? Drink water, juice, or soup when you can drink without vomiting. °? Make sure you have little or no nausea before eating solid foods. °General instructions °· Have a responsible adult stay with you until  you are awake and alert. °· Take over-the-counter and prescription medicines only as told by your health care provider. °· If you smoke, do not smoke without supervision. °· Keep all follow-up visits as told by your health care provider. This is important. °Contact a health care provider if: °· You keep feeling nauseous or you keep vomiting. °· You feel light-headed. °· You develop a rash. °· You have a fever. °Get help right away if: °· You have trouble breathing. °This information is not intended to replace advice given to you by your health care provider. Make sure you discuss any questions you have with your health care provider. °Document Released: 11/16/2012 Document Revised: 07/01/2015 Document Reviewed: 05/18/2015 °Elsevier Interactive Patient Education © 2019 Elsevier Inc. ° °

## 2018-08-04 NOTE — Procedures (Signed)
  Procedure: CT core liver lesion  18g x3 EBL:   minimal Complications:  none immediate  See full dictation in [facility].  Dillard Cannon MD Main # [phone] Pager  [phone]

## 2018-08-04 NOTE — H&P (Signed)
Chief Complaint: Patient was seen in consultation today for liver lesion biopsy at the request of Katragadda,Sreedhar  Referring Physician(s): Katragadda,Sreedhar  Supervising Physician: Arne Cleveland  Patient Status: [place]  History of Present Illness: Troy Smith is a 63 y.o. male with hx of colon cancer. He had recent PET scan which finds new hepatic lesion. He is referred for image guided biopsy of this lesion. PMHx, meds, labs, imaging, allergies reviewed. Feels well, no recent fevers, chills, illness. Has been NPO today as directed.   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   Carpal tunnel syndrome 05/02/2014   Bilateral   Cervical disc disorder with radiculopathy of cervical region 05/02/2014   Left C8   Diabetes mellitus without complication ([place])    Hypertension     Past Surgical History:  Procedure Laterality Date   BIOPSY  03/20/2016   Procedure: BIOPSY;  Surgeon: Rogene Houston, MD;  Location: [facility];  Service: Endoscopy;;  colon   BOWEL RESECTION N/A 03/23/2016   Procedure: LOW ANTERIOR RESECTION;  Surgeon: Aviva Signs, MD;  Location: [facility];  Service: General;  Laterality: N/A;   COLONOSCOPY N/A 03/20/2016   Procedure: COLONOSCOPY;  Surgeon: Rogene Houston, MD;  Location: [facility];  Service: Endoscopy;  Laterality: N/A;  730   COLONOSCOPY N/A 07/10/2016   Procedure: COLONOSCOPY;  Surgeon: Rogene Houston, MD;  Location: [facility];  Service: Endoscopy;  Laterality: N/A;  1040   NO PAST SURGERIES     POLYPECTOMY  07/10/2016   Procedure: POLYPECTOMY;  Surgeon: Rogene Houston, MD;  Location: [facility];  Service: Endoscopy;;  colon   TONSILLECTOMY      Allergies: Patient has no known allergies.  Medications: Prior to Admission medications   Medication Sig Start Date End Date Taking? Authorizing Provider  amLODipine (NORVASC) 10 MG tablet Take 10 mg by mouth  daily.   Yes [provider]  hydrochlorothiazide (HYDRODIURIL) 25 MG tablet Take 25 mg by mouth daily.    Yes [provider]  insulin glargine (LANTUS) 100 UNIT/ML injection Inject 30 Units into the skin at bedtime.    Yes [provider]  metoprolol succinate (TOPROL-XL) 50 MG 24 hr tablet Take 50 mg by mouth daily. Take with or immediately following a meal.   Yes [provider]     Family History  Problem Relation Age of Onset   COPD Mother    Melanoma Father     Social History   Socioeconomic History   Marital status: Married    Spouse name: Not on file   Number of children: Not on file   Years of education: Not on file   Highest education level: Not on file  Occupational History   Not on file  Social Needs   Financial resource strain: Not on file   Food insecurity    Worry: Not on file    Inability: Not on file   Transportation needs    Medical: Not on file    Non-medical: Not on file  Tobacco Use   Smoking status: Never Smoker   Smokeless tobacco: Never Used  Substance and Sexual Activity   Alcohol use: Yes    Comment: rarely   Drug use: No   Sexual activity: Not on file  Lifestyle   Physical activity    Days per week: Not on file    Minutes per session: Not on file  Stress: Not on file  Relationships   Social connections    Talks on phone: Not on file    Gets together: Not on file    Attends religious service: Not on file    Active member of club or organization: Not on file    Attends meetings of clubs or organizations: Not on file    Relationship status: Not on file  Other Topics Concern   Not on file  Social History Narrative   Not on file     Review of Systems: A 12 point ROS discussed and pertinent positives are indicated in the HPI above.  All other systems are negative.  Review of Systems  Vital Signs: BP (!) 151/98    Pulse 72    Temp 98.8 F (37.1 C) (Oral)    Resp 18    SpO2  100%   Physical Exam Constitutional:      Appearance: Normal appearance.  HENT:     Mouth/Throat:     Mouth: Mucous membranes are moist.     Pharynx: Oropharynx is clear.  Cardiovascular:     Rate and Rhythm: Normal rate and regular rhythm.     Heart sounds: Normal heart sounds.  Pulmonary:     Effort: Pulmonary effort is normal. No respiratory distress.     Breath sounds: Normal breath sounds.  Abdominal:     General: Abdomen is flat. There is no distension.     Palpations: Abdomen is soft. There is no mass.  Skin:    General: Skin is warm and dry.  Neurological:     General: No focal deficit present.     Mental Status: He is alert and oriented to person, place, and time.  Psychiatric:        Mood and Affect: Mood normal.        Judgment: Judgment normal.      Imaging: Ct Chest W Contrast  Result Date: 07/07/2018 CLINICAL DATA:  Adenocarcinoma of the sigmoid colon. Bowel resection in 2018. Elevated Cea. Change in bowel habits. Diabetes. Hypertension. EXAM: CT CHEST, ABDOMEN, AND PELVIS WITH CONTRAST TECHNIQUE: Multidetector CT imaging of the chest, abdomen and pelvis was performed following the standard protocol during bolus administration of intravenous contrast. CONTRAST:  146mL OMNIPAQUE IOHEXOL 300 MG/ML  SOLN COMPARISON:  04/21/2017 FINDINGS: CT CHEST FINDINGS Cardiovascular: Tortuous thoracic aorta. Normal heart size, without pericardial effusion. Aortic valve calcification. Lad coronary artery calcification on image 29/2. No central pulmonary embolism, on this non-dedicated study. Normal adrenal glands. Mediastinum/Nodes: No supraclavicular adenopathy. No mediastinal or hilar adenopathy. Lungs/Pleura: No pleural fluid. Similar bilateral pulmonary nodules, consistent with a benign etiology. Example at maximally 6 mm in the right lower lobe on image 93/4. There remainder of nodules are identified on series 4. Musculoskeletal: Mild right-sided gynecomastia. No acute osseous  abnormality. CT ABDOMEN PELVIS FINDINGS Hepatobiliary: Scattered low-density subcentimeter liver lesions are unchanged and can be presumed benign. A segment 4 B hypoattenuating liver lesion is new at 3.8 x 3.4 cm on image 56/2. Normal gallbladder, without biliary ductal dilatation. Pancreas: Mild pancreatic atrophy, without duct dilatation or acute inflammation. Spleen: Improvement to resolution of splenomegaly, 12.4 cm craniocaudal. Adrenals/Urinary Tract: Normal adrenal glands. Bilateral renal cortical scarring. Right greater than left renal collecting system calculi, on the order of 5 mm and less. No hydronephrosis. Too small to characterize upper pole left renal lesion. Median lobe impression of the prostate into the urinary bladder. Stomach/Bowel: Normal stomach, without wall thickening. Surgical sutures within the sigmoid. Development  of locally recurrent and regional disease, including a 2.2 x 2.5 cm area of soft tissue thickening along the posterior portion of the surgical site, extending into the presacral space. Example image 99/2 and sagittal image 104/6. No high-grade obstruction at this site. Large colonic stool burden throughout. Normal terminal ileum. Normal small bowel. Vascular/Lymphatic: Aortic and branch vessel atherosclerosis. Prominent porta hepatis nodes are similar and not pathologic by size criteria. An 8 mm perirectal node at the 3 o'clock position is enlarged from 5 mm on the prior. Reproductive: Moderate prostatomegaly. Other: No significant free fluid. No evidence of omental or peritoneal disease. Musculoskeletal: Lumbosacral spondylosis. IMPRESSION: 1. Development of isolated segment 4 B hepatic metastasis. 2. Surgical changes in the sigmoid with locally recurrent/regional disease as evidenced by soft tissue nodularity about the posterior aspect of the surgical site, extending in the presacral space. 3. Enlarging perirectal lymph node, suspicious for nodal metastasis. 4.  No acute  process or evidence of metastatic disease in the chest. 5. Bilateral nephrolithiasis. 6.  Possible constipation. 7. Coronary artery atherosclerosis. Aortic Atherosclerosis (ICD10-I70.0). 8. Prostatomegaly. Electronically Signed   By: Abigail Miyamoto M.D.   On: 07/07/2018 10:50   Mr Abdomen W Wo Contrast  Result Date: 07/14/2018 CLINICAL DATA:  63 year old male with history of recurrent colon carcinoma status post bowel resection in February 2018. Change in bowel habits with blood streak stools over the past year. New lesion noted in segment 4B of the liver on prior CT of the abdomen. Follow-up study. EXAM: MRI ABDOMEN WITHOUT AND WITH CONTRAST TECHNIQUE: Multiplanar multisequence MR imaging of the abdomen was performed both before and after the administration of intravenous contrast. CONTRAST:  10 mL of Gadavist. COMPARISON:  No prior abdominal MRI. CT the abdomen and pelvis 06/29/2018. FINDINGS: Lower chest: Unremarkable. Hepatobiliary: In segment 4B of the liver (axial image 34 of series 13 and coronal image 79 of series 21) there is a 2.9 x 3.6 x 3.2 cm lesion which is T1 hypointense, isointense on T2 weighted imaging, and demonstrates a hypovascular enhancement pattern, likely to represent a solitary hepatic metastasis. Several other subcentimeter T1 hypointense, T2 hyperintense, nonenhancing liver lesions are also noted, compatible with tiny cysts and/or biliary hamartomas. No intra or extrahepatic biliary ductal dilatation. Gallbladder is normal in appearance. Pancreas: No pancreatic mass. No pancreatic ductal dilatation. No pancreatic or peripancreatic fluid or inflammatory changes. Spleen:  Unremarkable. Adrenals/Urinary Tract: Multiple subcentimeter T1 hypointense, T2 hyperintense, nonenhancing lesions in both kidneys, compatible with tiny simple cysts. No hydroureteronephrosis in the visualized portions of the abdomen. Bilateral adrenal glands are normal in appearance. Stomach/Bowel: Visualized portions  are unremarkable. Vascular/Lymphatic: Aortic atherosclerosis, without evidence of aneurysm in the abdominal vasculature. No lymphadenopathy noted in the abdomen. Other: No significant volume of ascites noted in the visualized portions of the peritoneal cavity. Musculoskeletal: No aggressive appearing osseous lesions are noted in the visualized portions of the skeleton. IMPRESSION: 1. The lesion of concern in segment 4B of the liver has imaging characteristics most suggestive of a solitary hepatic metastasis, currently measuring 2.9 x 3.6 x 3.2 cm. 2. Aortic atherosclerosis. 3. Additional incidental findings, as above. Electronically Signed   By: Vinnie Langton M.D.   On: 07/14/2018 11:25   Ct Abdomen Pelvis W Contrast  Result Date: 07/07/2018 CLINICAL DATA:  Adenocarcinoma of the sigmoid colon. Bowel resection in 2018. Elevated Cea. Change in bowel habits. Diabetes. Hypertension. EXAM: CT CHEST, ABDOMEN, AND PELVIS WITH CONTRAST TECHNIQUE: Multidetector CT imaging of the chest, abdomen and pelvis was performed  following the standard protocol during bolus administration of intravenous contrast. CONTRAST:  148mL OMNIPAQUE IOHEXOL 300 MG/ML  SOLN COMPARISON:  04/21/2017 FINDINGS: CT CHEST FINDINGS Cardiovascular: Tortuous thoracic aorta. Normal heart size, without pericardial effusion. Aortic valve calcification. Lad coronary artery calcification on image 29/2. No central pulmonary embolism, on this non-dedicated study. Normal adrenal glands. Mediastinum/Nodes: No supraclavicular adenopathy. No mediastinal or hilar adenopathy. Lungs/Pleura: No pleural fluid. Similar bilateral pulmonary nodules, consistent with a benign etiology. Example at maximally 6 mm in the right lower lobe on image 93/4. There remainder of nodules are identified on series 4. Musculoskeletal: Mild right-sided gynecomastia. No acute osseous abnormality. CT ABDOMEN PELVIS FINDINGS Hepatobiliary: Scattered low-density subcentimeter liver lesions  are unchanged and can be presumed benign. A segment 4 B hypoattenuating liver lesion is new at 3.8 x 3.4 cm on image 56/2. Normal gallbladder, without biliary ductal dilatation. Pancreas: Mild pancreatic atrophy, without duct dilatation or acute inflammation. Spleen: Improvement to resolution of splenomegaly, 12.4 cm craniocaudal. Adrenals/Urinary Tract: Normal adrenal glands. Bilateral renal cortical scarring. Right greater than left renal collecting system calculi, on the order of 5 mm and less. No hydronephrosis. Too small to characterize upper pole left renal lesion. Median lobe impression of the prostate into the urinary bladder. Stomach/Bowel: Normal stomach, without wall thickening. Surgical sutures within the sigmoid. Development of locally recurrent and regional disease, including a 2.2 x 2.5 cm area of soft tissue thickening along the posterior portion of the surgical site, extending into the presacral space. Example image 99/2 and sagittal image 104/6. No high-grade obstruction at this site. Large colonic stool burden throughout. Normal terminal ileum. Normal small bowel. Vascular/Lymphatic: Aortic and branch vessel atherosclerosis. Prominent porta hepatis nodes are similar and not pathologic by size criteria. An 8 mm perirectal node at the 3 o'clock position is enlarged from 5 mm on the prior. Reproductive: Moderate prostatomegaly. Other: No significant free fluid. No evidence of omental or peritoneal disease. Musculoskeletal: Lumbosacral spondylosis. IMPRESSION: 1. Development of isolated segment 4 B hepatic metastasis. 2. Surgical changes in the sigmoid with locally recurrent/regional disease as evidenced by soft tissue nodularity about the posterior aspect of the surgical site, extending in the presacral space. 3. Enlarging perirectal lymph node, suspicious for nodal metastasis. 4.  No acute process or evidence of metastatic disease in the chest. 5. Bilateral nephrolithiasis. 6.  Possible  constipation. 7. Coronary artery atherosclerosis. Aortic Atherosclerosis (ICD10-I70.0). 8. Prostatomegaly. Electronically Signed   By: Abigail Miyamoto M.D.   On: 07/07/2018 10:50   Nm Pet Image Restag (ps) Skull Base To Thigh  Result Date: 07/15/2018 CLINICAL DATA:  Initial treatment strategy for adenocarcinoma of the sigmoid colon. EXAM: NUCLEAR MEDICINE PET SKULL BASE TO THIGH TECHNIQUE: 11.6 mCi F-18 FDG was injected intravenously. Full-ring PET imaging was performed from the skull base to thigh after the radiotracer. CT data was obtained and used for attenuation correction and anatomic localization. Fasting blood glucose: 160 mg/dl COMPARISON:  MRI of the abdomen of 07/14/2018. FINDINGS: Mediastinal blood pool activity: SUV max 3.5 Liver activity: SUV max NA NECK: No areas of abnormal hypermetabolism. Incidental CT findings: No cervical adenopathy. CHEST: No pulmonary parenchymal or thoracic nodal hypermetabolism. Incidental CT findings: Deferred to recent diagnostic CT. Faint lad coronary artery calcification. Mild bilateral gynecomastia, worse on the right. Scattered pulmonary nodules as detailed on that exam. ABDOMEN/PELVIS: Hypermetabolic segment 4A liver lesion measures 3.0 cm and a S.U.V. max of 10.4 on image 113/4. Hypermetabolism corresponding to the previously described sigmoid colon recurrence and direct extension  into the presacral space. Example at a S.U.V. max of 16.4 on image 174/4. Probable urinary contamination about the right hemiscrotum including on image 229/4. Incidental CT findings: Deferred to prior abdominopelvic diagnostic CTs. Bilateral renal collecting system calculi. Bladder distension with small left-sided bladder stones. Moderate prostatomegaly. SKELETON: No abnormal marrow activity. Incidental CT findings: none IMPRESSION: 1. Hypermetabolism corresponding to isolated segment 4A liver metastasis. 2. Hypermetabolic sigmoid colon local recurrence and adjacent extension into the  presacral space, as on CT. 3. Incidental findings, including prostatomegaly, bladder calculi, aortic and coronary artery atherosclerosis. Electronically Signed   By: Abigail Miyamoto M.D.   On: 07/15/2018 16:17    Labs:  CBC: Recent Labs    06/17/18 1500 07/19/18 1037 08/04/18 1200  WBC 7.2 10.8* 9.6  HGB 12.2* 13.3 13.5  HCT 37.3* 39.5 40.6  PLT 200 236 241    COAGS: No results for input(s): INR, APTT in the last 8760 hours.  BMP: Recent Labs    06/17/18 1500 07/19/18 1037  NA 141 141  K 3.9 3.8  CL 103 103  CO2 28 26  GLUCOSE 122* 193*  BUN 22 26*  CALCIUM 10.0 10.2  CREATININE 1.29* 1.47*  GFRNONAA 59* 50*  GFRAA >60 58*    LIVER FUNCTION TESTS: Recent Labs    06/17/18 1500 07/19/18 1037  BILITOT 0.8 1.1  AST 20 16  ALT 18 17  ALKPHOS 50 50  PROT 7.3 7.2  ALBUMIN 4.2 4.1    TUMOR MARKERS: No results for input(s): AFPTM, CEA, CA199, CHROMGRNA in the last 8760 hours.  Assessment and Plan: Hepatic lesion Hx of colon cancer For US guided liver lesion biopsy Labs ok Risks and benefits of liver biopsy was discussed with the patient and/or patient's family including, but not limited to bleeding, infection, damage to adjacent structures or low yield requiring additional tests.  All of the questions were answered and there is agreement to proceed.  Consent signed and in chart.    Thank you for this interesting consult.  I greatly enjoyed meeting Troy Smith and look forward to participating in their care.  A copy of this report was sent to the requesting provider on this date.  Electronically Signed: Ascencion Dike, PA-C 08/04/2018, 12:31 PM   I spent a total of 20 minutes in face to face in clinical consultation, greater than 50% of which was counseling/coordinating care for liver biopsy

## 2018-08-05 ENCOUNTER — Ambulatory Visit (HOSPITAL_COMMUNITY)
Admission: RE | Admit: 2018-08-05 | Discharge: 2018-08-05 | Disposition: A | Discharge status: Home / Self Care | Payer: 59 | Attending: General Surgery | Admitting: General Surgery

## 2018-08-05 ENCOUNTER — Ambulatory Visit (HOSPITAL_COMMUNITY): Payer: 59 | Admitting: Anesthesiology

## 2018-08-05 ENCOUNTER — Ambulatory Visit (HOSPITAL_COMMUNITY): Payer: 59

## 2018-08-05 ENCOUNTER — Encounter (HOSPITAL_COMMUNITY)
Admission: RE | Disposition: A | Discharge status: Home / Self Care | Payer: Self-pay | Source: Home / Self Care | Attending: General Surgery

## 2018-08-05 ENCOUNTER — Other Ambulatory Visit: Payer: Self-pay

## 2018-08-05 DIAGNOSIS — C189 Malignant neoplasm of colon, unspecified: Secondary | ICD-10-CM | POA: Diagnosis not present

## 2018-08-05 DIAGNOSIS — I10 Essential (primary) hypertension: Secondary | ICD-10-CM | POA: Diagnosis not present

## 2018-08-05 DIAGNOSIS — C187 Malignant neoplasm of sigmoid colon: Secondary | ICD-10-CM

## 2018-08-05 DIAGNOSIS — E119 Type 2 diabetes mellitus without complications: Secondary | ICD-10-CM | POA: Insufficient documentation

## 2018-08-05 DIAGNOSIS — C787 Secondary malignant neoplasm of liver and intrahepatic bile duct: Secondary | ICD-10-CM | POA: Diagnosis not present

## 2018-08-05 DIAGNOSIS — Z9049 Acquired absence of other specified parts of digestive tract: Secondary | ICD-10-CM | POA: Diagnosis not present

## 2018-08-05 DIAGNOSIS — G5603 Carpal tunnel syndrome, bilateral upper limbs: Secondary | ICD-10-CM | POA: Insufficient documentation

## 2018-08-05 DIAGNOSIS — Z95828 Presence of other vascular implants and grafts: Secondary | ICD-10-CM

## 2018-08-05 HISTORY — PX: PORTACATH PLACEMENT: SHX2246

## 2018-08-05 LAB — GLUCOSE, CAPILLARY: Glucose-Capillary: 155 mg/dL — ABNORMAL HIGH (ref 70–99)

## 2018-08-05 SURGERY — INSERTION, TUNNELED CENTRAL VENOUS DEVICE, WITH PORT
Anesthesia: Monitor Anesthesia Care | Site: Chest | Laterality: Left

## 2018-08-05 MED ORDER — PROMETHAZINE HCL 25 MG/ML IJ SOLN: 6.2500 mg | INTRAMUSCULAR | Status: DC | PRN

## 2018-08-05 MED ORDER — MIDAZOLAM HCL 2 MG/2ML IJ SOLN
INTRAMUSCULAR | Status: AC
  Filled 2018-08-05: qty 2

## 2018-08-05 MED ORDER — LACTATED RINGERS IV SOLN: INTRAVENOUS | Status: DC

## 2018-08-05 MED ORDER — LIDOCAINE HCL (CARDIAC) PF 100 MG/5ML IV SOSY
PREFILLED_SYRINGE | INTRAVENOUS | Status: DC | PRN
  Administered 2018-08-05: 40 mg via INTRAVENOUS

## 2018-08-05 MED ORDER — MIDAZOLAM HCL 5 MG/5ML IJ SOLN
INTRAMUSCULAR | Status: DC | PRN
  Administered 2018-08-05 (×2): 2 mg via INTRAVENOUS

## 2018-08-05 MED ORDER — HEPARIN SOD (PORK) LOCK FLUSH 100 UNIT/ML IV SOLN
INTRAVENOUS | Status: AC
  Filled 2018-08-05: qty 5

## 2018-08-05 MED ORDER — GLYCOPYRROLATE 0.2 MG/ML IJ SOLN
INTRAMUSCULAR | Status: DC | PRN
  Administered 2018-08-05: 0.2 mg via INTRAVENOUS

## 2018-08-05 MED ORDER — LIDOCAINE HCL (PF) 1 % IJ SOLN
INTRAMUSCULAR | Status: DC | PRN
  Administered 2018-08-05: 8 mL

## 2018-08-05 MED ORDER — HYDROMORPHONE HCL 1 MG/ML IJ SOLN: 0.2500 mg | INTRAMUSCULAR | Status: DC | PRN

## 2018-08-05 MED ORDER — CHLORHEXIDINE GLUCONATE CLOTH 2 % EX PADS: 6.0000 | MEDICATED_PAD | Freq: Once | CUTANEOUS | Status: DC

## 2018-08-05 MED ORDER — SODIUM CHLORIDE (PF) 0.9 % IJ SOLN
INTRAMUSCULAR | Status: DC | PRN
  Administered 2018-08-05: 50 mL

## 2018-08-05 MED ORDER — ONDANSETRON HCL 4 MG/2ML IJ SOLN
INTRAMUSCULAR | Status: AC
  Filled 2018-08-05: qty 2

## 2018-08-05 MED ORDER — KETOROLAC TROMETHAMINE 30 MG/ML IJ SOLN
30.0000 mg | Freq: Once | INTRAMUSCULAR | Status: AC
  Administered 2018-08-05: 30 mg via INTRAVENOUS
  Filled 2018-08-05: qty 1

## 2018-08-05 MED ORDER — PROPOFOL 10 MG/ML IV BOLUS
INTRAVENOUS | Status: AC
  Filled 2018-08-05: qty 20

## 2018-08-05 MED ORDER — HEPARIN SOD (PORK) LOCK FLUSH 100 UNIT/ML IV SOLN
INTRAVENOUS | Status: DC | PRN
  Administered 2018-08-05: 500 [IU] via INTRAVENOUS

## 2018-08-05 MED ORDER — HYDROCODONE-ACETAMINOPHEN 7.5-325 MG PO TABS
1.0000 | ORAL_TABLET | Freq: Once | ORAL | Status: AC | PRN
  Administered 2018-08-05: 10:00:00 1 via ORAL
  Filled 2018-08-05: qty 1

## 2018-08-05 MED ORDER — HYDROCODONE-ACETAMINOPHEN 5-325 MG PO TABS: 1.0000 | ORAL_TABLET | ORAL | 0 refills | Status: DC | PRN

## 2018-08-05 MED ORDER — LIDOCAINE HCL (PF) 1 % IJ SOLN
INTRAMUSCULAR | Status: AC
  Filled 2018-08-05: qty 30

## 2018-08-05 MED ORDER — CEFAZOLIN SODIUM-DEXTROSE 2-4 GM/100ML-% IV SOLN
2.0000 g | INTRAVENOUS | Status: AC
  Administered 2018-08-05: 2 g via INTRAVENOUS
  Filled 2018-08-05: qty 100

## 2018-08-05 MED ORDER — MEPERIDINE HCL 50 MG/ML IJ SOLN: 6.2500 mg | INTRAMUSCULAR | Status: DC | PRN

## 2018-08-05 MED ORDER — KETAMINE HCL 50 MG/5ML IJ SOSY
PREFILLED_SYRINGE | INTRAMUSCULAR | Status: AC
  Filled 2018-08-05: qty 5

## 2018-08-05 MED ORDER — LACTATED RINGERS IV SOLN
INTRAVENOUS | Status: DC
  Administered 2018-08-05: 09:00:00 via INTRAVENOUS

## 2018-08-05 MED ORDER — GLYCOPYRROLATE PF 0.2 MG/ML IJ SOSY
PREFILLED_SYRINGE | INTRAMUSCULAR | Status: AC
  Filled 2018-08-05: qty 1

## 2018-08-05 MED ORDER — LIDOCAINE 2% (20 MG/ML) 5 ML SYRINGE
INTRAMUSCULAR | Status: AC
  Filled 2018-08-05: qty 5

## 2018-08-05 SURGICAL SUPPLY — 30 items
BAG DECANTER FOR FLEXI CONT (MISCELLANEOUS) ×3 IMPLANT
CHLORAPREP W/TINT 10.5 ML (MISCELLANEOUS) ×3 IMPLANT
CLOTH BEACON ORANGE TIMEOUT ST (SAFETY) ×3 IMPLANT
COVER LIGHT HANDLE STERIS (MISCELLANEOUS) ×6 IMPLANT
COVER WAND RF STERILE (DRAPES) ×3 IMPLANT
DECANTER SPIKE VIAL GLASS SM (MISCELLANEOUS) ×3 IMPLANT
DERMABOND ADVANCED (GAUZE/BANDAGES/DRESSINGS) ×2
DERMABOND ADVANCED .7 DNX12 (GAUZE/BANDAGES/DRESSINGS) ×1 IMPLANT
DRAPE C-ARM FOLDED MOBILE STRL (DRAPES) ×3 IMPLANT
ELECT REM PT RETURN 9FT ADLT (ELECTROSURGICAL) ×3
ELECTRODE REM PT RTRN 9FT ADLT (ELECTROSURGICAL) ×1 IMPLANT
GLOVE BIOGEL PI IND STRL 7.0 (GLOVE) ×2 IMPLANT
GLOVE BIOGEL PI INDICATOR 7.0 (GLOVE) ×4
GLOVE ECLIPSE 6.5 STRL STRAW (GLOVE) ×2 IMPLANT
GLOVE SURG SS PI 7.5 STRL IVOR (GLOVE) ×3 IMPLANT
GOWN STRL REUS W/TWL LRG LVL3 (GOWN DISPOSABLE) ×6 IMPLANT
IV NS 500ML (IV SOLUTION) ×2
IV NS 500ML BAXH (IV SOLUTION) ×1 IMPLANT
KIT PORT POWER 8FR ISP MRI (Port) ×3 IMPLANT
KIT TURNOVER KIT A (KITS) ×3 IMPLANT
NDL HYPO 25X1 1.5 SAFETY (NEEDLE) ×1 IMPLANT
NEEDLE HYPO 25X1 1.5 SAFETY (NEEDLE) ×3 IMPLANT
PACK MINOR (CUSTOM PROCEDURE TRAY) ×3 IMPLANT
PAD ARMBOARD 7.5X6 YLW CONV (MISCELLANEOUS) ×3 IMPLANT
SET BASIN LINEN APH (SET/KITS/TRAYS/PACK) ×3 IMPLANT
SUT MNCRL AB 4-0 PS2 18 (SUTURE) ×3 IMPLANT
SUT VIC AB 3-0 SH 27 (SUTURE) ×2
SUT VIC AB 3-0 SH 27X BRD (SUTURE) ×1 IMPLANT
SYR 5ML LL (SYRINGE) ×3 IMPLANT
SYR CONTROL 10ML LL (SYRINGE) ×3 IMPLANT

## 2018-08-05 NOTE — Anesthesia Preprocedure Evaluation (Signed)
Anesthesia Evaluation    Airway Mallampati: II       Dental  (+) Teeth Intact   Pulmonary    breath sounds clear to auscultation       Cardiovascular hypertension,  Rhythm:regular     Neuro/Psych  Neuromuscular disease    GI/Hepatic   Endo/Other  diabetes, Type 2  Renal/GU      Musculoskeletal   Abdominal   Peds  Hematology  (+) Blood dyscrasia, anemia ,   Anesthesia Other Findings Recurrent Colon Ca  When asked NPO status, started by saying a glass of orange Juice- and then said he was kidding- didn't drink or eat anything.     Reproductive/Obstetrics                             Anesthesia Physical Anesthesia Plan  ASA: III  Anesthesia Plan: MAC   Post-op Pain Management:    Induction:   PONV Risk Score and Plan:   Airway Management Planned:   Additional Equipment:   Intra-op Plan:   Post-operative Plan:   Informed Consent: I have reviewed the patients History and Physical, chart, labs and discussed the procedure including the risks, benefits and alternatives for the proposed anesthesia with the patient or authorized representative who has indicated his/her understanding and acceptance.       Plan Discussed with: Anesthesiologist  Anesthesia Plan Comments:         Anesthesia Quick Evaluation

## 2018-08-05 NOTE — Interval H&P Note (Signed)
History and Physical Interval Note:  08/05/2018 8:47 AM  Troy Smith  has presented today for surgery, with the diagnosis of colon cancer.  The various methods of treatment have been discussed with the patient and family. After consideration of risks, benefits and other options for treatment, the patient has consented to  Procedure(s): INSERTION PORT-A-CATH (Left) as a surgical intervention.  The patient's history has been reviewed, patient examined, no change in status, stable for surgery.  I have reviewed the patient's chart and labs.  Questions were answered to the patient's satisfaction.     Aviva Signs

## 2018-08-05 NOTE — Op Note (Signed)
Patient:  Troy Smith  DOB:  06/07/1955  MRN:  [id number]   Preop Diagnosis: Recurrent colon carcinoma with metastatic disease to liver  Postop Diagnosis: Same  Procedure: Port-A-Cath insertion  Surgeon: Aviva Signs, MD  Anes: MAC  Indications: Patient is a 63 year old white male with a history of sigmoid colon carcinoma who presents with recurrence of the cancer.  He is undergoing neoadjuvant chemotherapy and needs central venous access.  The risks and benefits of the procedure including bleeding, infection, and pneumothorax were fully explained to the patient, who gave informed consent.  Procedure note: The patient was placed in the supine position.  The left upper chest was prepped and draped using usual sterile technique with ChloraPrep.  Surgical site confirmation was performed.  1% Xylocaine was used for local anesthesia.  An incision was made below the left clavicle.  A subcutaneous pocket was formed.  A needle was advanced into the left subclavian vein using the Seldinger technique without difficulty.  A guidewire was then advanced into the right atrium under fluoroscopic guidance.  An introducer and peel-away sheath were placed over the guidewire.  The catheter was inserted through the peel-away sheath and the peel-away sheath was removed.  The catheter was then attached to the port and the port placed in subcutaneous pocket.  Adequate positioning was confirmed by fluoroscopy.  Good backflow of venous blood was noted on aspiration of the port.  Port was flushed with heparin flush.  The subcutaneous layer was reapproximated using 3-0 Vicryl interrupted sutures.  The skin was closed using a 4-0 Monocryl subcuticular suture.  Dermabond was applied.  All tape and needle counts were correct at the end of the procedure.  The patient was transferred to PACU in stable condition.  A chest x-ray will be performed at that time.  Complications: None  EBL: Minimal  Specimen:  None

## 2018-08-05 NOTE — Transfer of Care (Signed)
Immediate Anesthesia Transfer of Care Note  Patient: Troy Smith  Procedure(s) Performed: INSERTION PORT-A-CATH (Left Chest)  Patient Location: PACU  Anesthesia Type:MAC  Level of Consciousness: awake, alert , oriented and patient cooperative  Airway & Oxygen Therapy: Patient Spontanous Breathing  Post-op Assessment: Report given to RN and Post -op Vital signs reviewed and stable  Post vital signs: Reviewed and stable  Last Vitals:  Vitals Value Taken Time  BP 124/84 08/05/18 0946  Temp    Pulse 71 08/05/18 0948  Resp 20 08/05/18 0948  SpO2 100 % 08/05/18 0948  Vitals shown include unvalidated device data.  Last Pain:  Vitals:   08/05/18 0912  TempSrc: Oral  PainSc: 0-No pain         Complications: No apparent anesthesia complications

## 2018-08-05 NOTE — Anesthesia Postprocedure Evaluation (Signed)
Anesthesia Post Note  Patient: Troy Smith  Procedure(s) Performed: INSERTION PORT-A-CATH (Left Chest)  Anesthesia Post Evaluation   Last Vitals:  Vitals:   08/05/18 0912 08/05/18 0948  BP: 124/82   Pulse: 72   Resp: 19   Temp: 36.8 C 36.9 C  SpO2: 97%     Last Pain:  Vitals:   08/05/18 0912  TempSrc: Oral  PainSc: 0-No pain                 Ermon Sagan A

## 2018-08-05 NOTE — Anesthesia Procedure Notes (Signed)
Procedure Name: MAC Date/Time: 08/05/2018 9:10 AM Performed by: Andree Elk Bertine Schlottman A, CRNA Pre-anesthesia Checklist: Patient identified, Emergency Drugs available, Suction available, Timeout performed and Patient being monitored Patient Re-evaluated:Patient Re-evaluated prior to induction Oxygen Delivery Method: Nasal Cannula

## 2018-08-05 NOTE — Anesthesia Postprocedure Evaluation (Signed)
Anesthesia Post Note  Patient: Troy Smith  Procedure(s) Performed: INSERTION PORT-A-CATH (Left Chest)  Patient location during evaluation: PACU Anesthesia Type: MAC Level of consciousness: awake and alert and oriented Pain management: pain level controlled Vital Signs Assessment: post-procedure vital signs reviewed and stable Respiratory status: spontaneous breathing and respiratory function stable Postop Assessment: no apparent nausea or vomiting Anesthetic complications: no     Last Vitals:  Vitals:   08/05/18 0912 08/05/18 0948  BP: 124/82   Pulse: 72   Resp: 19   Temp: 36.8 C 36.9 C  SpO2: 97%     Last Pain:  Vitals:   08/05/18 0912  TempSrc: Oral  PainSc: 0-No pain                 ADAMS, AMY A

## 2018-08-05 NOTE — Discharge Instructions (Signed)
Implanted Port Insertion, Care After  This sheet gives you information about how to care for yourself after your procedure. Your health care provider may also give you more specific instructions. If you have problems or questions, contact your health care provider.  What can I expect after the procedure?  After the procedure, it is common to have:   Discomfort at the port insertion site.   Bruising on the skin over the port. This should improve over 3-4 days.  Follow these instructions at home:  Port care   After your port is placed, you will get a manufacturer's information card. The card has information about your port. Keep this card with you at all times.   Take care of the port as told by your health care provider. Ask your health care provider if you or a family member can get training for taking care of the port at home. A home health care nurse may also take care of the port.   Make sure to remember what type of port you have.  Incision care          Follow instructions from your health care provider about how to take care of your port insertion site. Make sure you:  ? Wash your hands with soap and water before and after you change your bandage (dressing). If soap and water are not available, use hand sanitizer.  ? Change your dressing as told by your health care provider.  ? Leave stitches (sutures), skin glue, or adhesive strips in place. These skin closures may need to stay in place for 2 weeks or longer. If adhesive strip edges start to loosen and curl up, you may trim the loose edges. Do not remove adhesive strips completely unless your health care provider tells you to do that.   Check your port insertion site every day for signs of infection. Check for:  ? Redness, swelling, or pain.  ? Fluid or blood.  ? Warmth.  ? Pus or a bad smell.  Activity   Return to your normal activities as told by your health care provider. Ask your health care provider what activities are safe for you.   Do not  lift anything that is heavier than 10 lb (4.5 kg), or the limit that you are told, until your health care provider says that it is safe.  General instructions   Take over-the-counter and prescription medicines only as told by your health care provider.   Do not take baths, swim, or use a hot tub until your health care provider approves. Ask your health care provider if you may take showers. You may only be allowed to take sponge baths.   Do not drive for 24 hours if you were given a sedative during your procedure.   Wear a medical alert bracelet in case of an emergency. This will tell any health care providers that you have a port.   Keep all follow-up visits as told by your health care provider. This is important.  Contact a health care provider if:   You cannot flush your port with saline as directed, or you cannot draw blood from the port.   You have a fever or chills.   You have redness, swelling, or pain around your port insertion site.   You have fluid or blood coming from your port insertion site.   Your port insertion site feels warm to the touch.   You have pus or a bad smell coming from the port   insertion site.  Get help right away if:   You have chest pain or shortness of breath.   You have bleeding from your port that you cannot control.  Summary   Take care of the port as told by your health care provider. Keep the manufacturer's information card with you at all times.   Change your dressing as told by your health care provider.   Contact a health care provider if you have a fever or chills or if you have redness, swelling, or pain around your port insertion site.   Keep all follow-up visits as told by your health care provider.  This information is not intended to replace advice given to you by your health care provider. Make sure you discuss any questions you have with your health care provider.  Document Released: 11/16/2012 Document Revised: 08/24/2017 Document Reviewed:  08/24/2017  [employer] Interactive Patient Education  2019 [employer]    Monitored Anesthesia Care, Care After  These instructions provide you with information about caring for yourself after your procedure. Your health care provider may also give you more specific instructions. Your treatment has been planned according to current medical practices, but problems sometimes occur. Call your health care provider if you have any problems or questions after your procedure.  What can I expect after the procedure?  After your procedure, you may:   Feel sleepy for several hours.   Feel clumsy and have poor balance for several hours.   Feel forgetful about what happened after the procedure.   Have poor judgment for several hours.   Feel nauseous or vomit.   Have a sore throat if you had a breathing tube during the procedure.  Follow these instructions at home:  For at least 24 hours after the procedure:          Have a responsible adult stay with you. It is important to have someone help care for you until you are awake and alert.   Rest as needed.   Do not:  ? Participate in activities in which you could fall or become injured.  ? Drive.  ? Use heavy machinery.  ? Drink alcohol.  ? Take sleeping pills or medicines that cause drowsiness.  ? Make important decisions or sign legal documents.  ? Take care of children on your own.  Eating and drinking   Follow the diet that is recommended by your health care provider.   If you vomit, drink water, juice, or soup when you can drink without vomiting.   Make sure you have little or no nausea before eating solid foods.  General instructions   Take over-the-counter and prescription medicines only as told by your health care provider.   If you have sleep apnea, surgery and certain medicines can increase your risk for breathing problems. Follow instructions from your health care provider about wearing your sleep device:  ? Anytime you are sleeping, including during  daytime naps.  ? While taking prescription pain medicines, sleeping medicines, or medicines that make you drowsy.   If you smoke, do not smoke without supervision.   Keep all follow-up visits as told by your health care provider. This is important.  Contact a health care provider if:   You keep feeling nauseous or you keep vomiting.   You feel light-headed.   You develop a rash.   You have a fever.  Get help right away if:   You have trouble breathing.  Summary   For several hours   after your procedure, you may feel sleepy and have poor judgment.   Have a responsible adult stay with you for at least 24 hours or until you are awake and alert.  This information is not intended to replace advice given to you by your health care provider. Make sure you discuss any questions you have with your health care provider.  Document Released: 05/19/2015 Document Revised: 09/11/2016 Document Reviewed: 05/19/2015  [employer] Interactive Patient Education  2019 [employer]

## 2018-08-08 ENCOUNTER — Encounter (HOSPITAL_COMMUNITY): Payer: Self-pay | Admitting: General Surgery

## 2018-08-10 ENCOUNTER — Other Ambulatory Visit: Payer: Self-pay

## 2018-08-10 ENCOUNTER — Encounter (HOSPITAL_COMMUNITY): Payer: Self-pay | Admitting: *Deleted

## 2018-08-10 ENCOUNTER — Inpatient Hospital Stay (HOSPITAL_COMMUNITY): Payer: 59 | Attending: Hematology | Admitting: Hematology

## 2018-08-10 ENCOUNTER — Encounter (HOSPITAL_COMMUNITY): Payer: Self-pay

## 2018-08-10 ENCOUNTER — Encounter (HOSPITAL_COMMUNITY): Payer: Self-pay | Admitting: Hematology

## 2018-08-10 DIAGNOSIS — R634 Abnormal weight loss: Secondary | ICD-10-CM | POA: Insufficient documentation

## 2018-08-10 DIAGNOSIS — Z5112 Encounter for antineoplastic immunotherapy: Secondary | ICD-10-CM | POA: Insufficient documentation

## 2018-08-10 DIAGNOSIS — Z7189 Other specified counseling: Secondary | ICD-10-CM

## 2018-08-10 DIAGNOSIS — C787 Secondary malignant neoplasm of liver and intrahepatic bile duct: Secondary | ICD-10-CM | POA: Insufficient documentation

## 2018-08-10 DIAGNOSIS — R110 Nausea: Secondary | ICD-10-CM | POA: Diagnosis not present

## 2018-08-10 DIAGNOSIS — C187 Malignant neoplasm of sigmoid colon: Secondary | ICD-10-CM

## 2018-08-10 DIAGNOSIS — I10 Essential (primary) hypertension: Secondary | ICD-10-CM | POA: Insufficient documentation

## 2018-08-10 DIAGNOSIS — E119 Type 2 diabetes mellitus without complications: Secondary | ICD-10-CM | POA: Insufficient documentation

## 2018-08-10 DIAGNOSIS — E876 Hypokalemia: Secondary | ICD-10-CM | POA: Insufficient documentation

## 2018-08-10 DIAGNOSIS — Z5111 Encounter for antineoplastic chemotherapy: Secondary | ICD-10-CM | POA: Diagnosis present

## 2018-08-10 DIAGNOSIS — K625 Hemorrhage of anus and rectum: Secondary | ICD-10-CM | POA: Diagnosis not present

## 2018-08-10 DIAGNOSIS — Z95828 Presence of other vascular implants and grafts: Secondary | ICD-10-CM

## 2018-08-10 HISTORY — DX: Presence of other vascular implants and grafts: Z95.828

## 2018-08-10 MED ORDER — LOPERAMIDE HCL 2 MG PO TABS: ORAL_TABLET | ORAL | 1 refills | Status: DC

## 2018-08-10 MED ORDER — PROCHLORPERAZINE MALEATE 10 MG PO TABS: 10.0000 mg | ORAL_TABLET | Freq: Four times a day (QID) | ORAL | 1 refills | Status: DC | PRN

## 2018-08-10 MED ORDER — LIDOCAINE-PRILOCAINE 2.5-2.5 % EX CREA: TOPICAL_CREAM | CUTANEOUS | 3 refills | Status: DC

## 2018-08-10 NOTE — Patient Instructions (Addendum)
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  You will be treated with a chemotherapy regimen called FOLFIRINOX every 14 days.  The medications in this regimen include oxaliplatin, leucovorin, irinotecan, and fluorouracil (5-FU). You will also receive a medication named bevacizumab (Avastin) that works with your immune system to help cut off the blood supply to the cancer cells preventing their spread and causing them to die.  The intent of treatment is to cure this cancer.  You will see the doctor regularly throughout treatment.  We monitor your lab work prior to every treatment. The doctor monitors your response to treatment by the way you are feeling, your blood work, and scans periodically. There will be wait times while you are here for treatment.  It will take about 30 minutes to 1 hour for your lab work to result.  Then there will be wait times while pharmacy mixes your medications.   Medications you will receive in the clinic prior to your chemotherapy medications:  Aloxi:  ALOXI is used in adults to help prevent the nausea and vomiting that happens with certain anti-cancer medicines (chemotherapy).  Aloxi is a long acting medication, and will remain in your system for 24-36 hours.   Emend:  This is an anti-nausea medication that is used with Aloxi to help prevent nausea and vomiting caused by chemotherapy.  Dexamethasone:  This is a steroid given prior to chemotherapy to help prevent allergic reactions; it may also help prevent and control nausea and diarrhea.    Oxaliplatin (Eloxatin)  About This Drug  Oxaliplatin is used to treat cancer. It is given in the vein (IV).  It takes two hours to infuse.  Possible Side Effects  . Bone marrow suppression. This is a decrease in the number of white blood cells, red blood cells, and platelets. This may raise your risk of infection, make you tired and  weak (fatigue), and raise your risk of bleeding.  . Tiredness  . Soreness of the mouth and throat. You may have red areas, white patches, or sores that hurt.  . Nausea and vomiting (throwing up)  . Diarrhea (loose bowel movements)  . Changes in your liver function  . Effects on the nerves called peripheral neuropathy. You may feel numbness, tingling, or pain in your hands and feet, and may be worse in cold temperatures. It may be hard for you to button your clothes, open jars, or walk as usual. The effect on the nerves may get worse with more doses of the drug. These effects get better in some people after the drug is stopped but it does not get better in all people  Note: Each of the side effects above was reported in 40% or greater of patients treated with oxaliplatin. Not all possible side effects are included above.  Warnings and Precautions  . Allergic reactions, including anaphylaxis, which may be life-threatening are rare but may happen in some patients. Signs of allergic reaction to this drug may be swelling of the face, feeling like your tongue or throat are swelling, trouble breathing, rash, itching, fever, chills, feeling dizzy, and/or feeling that your heart is beating in a fast or not normal way. If this happens, do not take another dose of this drug. You should get urgent medical treatment.  . Inflammation (swelling) of the lungs, which may be life-threatening. You may have a dry cough or trouble breathing.  . Effects  on the nerves (neuropathy) may resolve within 14 days, or it may persist beyond 14 days.  . Severe decrease in white blood cells when combined with the chemotherapy agents 5-fluorouracil and leucovorin. This may be life-threatening.  . Severe changes in your liver function  . Abnormal heart beat and/or EKG, which can be life-threatening  . Rhabdomyolysis- damage to your muscles which may release proteins in your blood and affect how your kidneys  work, which can be life-threatening. You may have severe muscle weakness and/or pain, or dark urine.  Important Information  . This drug may impair your ability to drive or use machinery. Talk to your doctor and/or nurse about precautions you may need to take.  . This drug may be present in the saliva, tears, sweat, urine, stool, vomit, semen, and vaginal secretions. Talk to your doctor and/or your nurse about the necessary precautions to take during this time.  . The effects on the nerves can be aggravated by exposure to cold. Avoid cold beverages, use of ice and make sure you cover your skin and dress warmly prior to being exposed to cold temperatures while you are receiving treatment with oxaliplatin.  Treating Side Effects  . Manage tiredness by pacing your activities for the day.  . Be sure to include periods of rest between energy-draining activities.  . To decrease the risk of infection, wash your hands regularly.  . Avoid close contact with people who have a cold, the flu, or other infections. . Take your temperature as your doctor or nurse tells you, and whenever you feel like you may have a fever.  . To help decrease the risk of bleeding, use a soft toothbrush. Check with your nurse before using dental floss.  . Be very careful when using knives or tools.  . Use an electric shaver instead of a razor.  . Drink plenty of fluids (a minimum of eight glasses per day is recommended).  . Mouth care is very important. Your mouth care should consist of routine, gentle cleaning of your teeth or dentures and rinsing your mouth with a mixture of 1/2 teaspoon of salt in 8 ounces of water or 1/2 teaspoon of baking soda in 8 ounces of water. This should be done at least after each meal and at bedtime.  . If you have mouth sores, avoid mouthwash that has alcohol. Also avoid alcohol and smoking because they can bother your mouth and throat.  . To help with nausea and vomiting,  eat small, frequent meals instead of three large meals a day. Choose foods and drinks that are at room temperature. Ask your nurse or doctor about other helpful tips and medicine that is available to help stop or lessen these symptoms.  . If you throw up or have loose bowel movements, you should drink more fluids so that you do not become dehydrated (lack of water in the body from losing too much fluid).  . If you have diarrhea, eat low-fiber foods that are high in protein and calories and avoid foods that can irritate your digestive tracts or lead to cramping.  . Ask your nurse or doctor about medicine that can lessen or stop your diarrhea.  . If you have numbness and tingling in your hands and feet, be careful when cooking, walking, and handling sharp objects and hot liquids.  . Do not drink cold drinks or use ice in beverages. Drink fluids at room temperature or warmer, and drink through a straw.  Langley Gauss gloves  to touch cold objects, and wear warm clothing and cover you skin during cold weather.  Food and Drug Interactions  . There are no known interactions of oxaliplatin with food and other medications.  . This drug may interact with other medicines. Tell your doctor and pharmacist about all the prescription and over-the-counter medicines and dietary supplements (vitamins, minerals, herbs and others) that you are taking at this time. Also, check with your doctor or pharmacist before starting any new prescription or over-the-counter medicines, or dietary supplements to make sure that there are no interactions  When to Call the Doctor  Call your doctor or nurse if you have any of these symptoms and/or any new or unusual symptoms:  . Fever of 100.4 F (38 C) or higher  . Chills  . Tiredness that interferes with your daily activities  . Feeling dizzy or lightheaded  . Easy bleeding or bruising  . Feeling that your heart is beating in a fast or not normal way  (palpitations)  . Pain in your chest  . Dry cough  . Trouble breathing  . Pain in your mouth or throat that makes it hard to eat or drink  . Nausea that stops you from eating or drinking and/or is not relieved by prescribed medicines  . Throwing up more than 3 times a day  . Diarrhea, 4 times in one day or diarrhea with lack of strength or a feeling of being dizzy  . Numbness, tingling, or pain in your hands and feet  . Signs of possible liver problems: dark urine, pale bowel movements, bad stomach pain, feeling very tired and weak, unusual itching, or yellowing of the eyes or skin  . Signs of rhabdomyolysis: decreased urine, very dark urine, muscle pain in the shoulders, thighs, or lower back; muscle weakness or trouble moving arms and legs  . Signs of allergic reaction: swelling of the face, feeling like your tongue or throat are swelling, trouble breathing, rash, itching, fever, chills, feeling dizzy, and/or feeling that your heart is beating in a fast or not normal way. If this happens, call 911 for emergency care.  . If you think you may be pregnant  Reproduction Warnings  . Pregnancy warning: This drug may have harmful effects on the unborn baby. Women of childbearing potential should use effective methods of birth control during your cancer treatment. Let your doctor know right away if you think you may be pregnant or may have impregnated your partner.  . Breastfeeding warning: It is not known if this drug passes into breast milk. For this reason, women should talk to their doctor about the risks and benefits of breastfeeding during treatment with this drug because this drug may enter the breast milk and cause harm to a breastfeeding baby.  . Fertility warning: Human fertility studies have not been done with this drug. Talk with your doctor or nurse if you plan to have children. Ask for information on sperm or egg banking.   Leucovorin Calcium  About This  Drug  Leucovorin is a vitamin. It is used in combination with other cancer fighting drugs such as 5-fluorouracil and methotrexate. Leucovorin is given in the vein (IV).  It is given to enhance the anti-cancer effects of fluorouracil (5FU). It will infuse along with the oxaliplatin over 2 hours.   Possible Side Effects . Rash and itching  Note: Leucovorin by itself has very few side effects. Other side effects you may have can be caused by the other drugs you  are taking, such as 5-fluorouracil.  Warnings and Precautions  . Allergic reactions, including anaphylaxis are rare but may happen in some patients. Signs of allergic reaction to this drug may be swelling of the face, feeling like your tongue or throat are swelling, trouble breathing, rash, itching, fever, chills, feeling dizzy, and/or feeling that your heart is beating in a fast or not normal way. If this happens, do not take another dose of this drug. You should get urgent medical treatment.  Food and Drug Interactions  . There are no known interactions of leucovorin with food.  . This drug may interact with other medicines. Tell your doctor and pharmacist about all the prescription and over-the-counter medicines and dietary supplements (vitamins, minerals, herbs and others) that you are taking at this time.  . Also, check with your doctor or pharmacist before starting any new prescription or over-the-counter medicines, or dietary supplements to make sure that there are no interactions.  When to Call the Doctor  Call your doctor or nurse if you have any of these symptoms and/or any new or unusual symptoms:  . A new rash or a rash that is not relieved by prescribed medicines  . Signs of allergic reaction: swelling of the face, feeling like your tongue or throat are swelling, trouble  breathing, rash, itching, fever, chills, feeling dizzy, and/or feeling that your heart is beating in a fast or not normal way. If this  happens, call 911 for emergency care.  . If you think you may be pregnant  Reproduction Warnings  . Pregnancy warning: It is not known if this drug may harm an unborn child. For this reason, be sure to talk with your doctor if you are pregnant or planning to become pregnant while receiving this drug. Let your doctor know right away if you think you may be pregnant  . Breastfeeding warning: It is not known if this drug passes into breast milk. For this reason, women should talk to their doctor about the risks and benefits of breastfeeding during treatment with this drug because this drug may enter the breast milk and cause harm to a breastfeeding baby.  . Fertility warning: Human fertility studies have not been done with this drug. Talk with your doctor or nurse if you plan to have children. Ask for information on sperm or egg banking.    Irinotecan Hydrochloride (Generic Name) Other Names: Camptosar, camptothecin-11, CPT-11   About this drug Irinotecan hydrochloride is used to treat cancer. This drug is given in the vein (IV). It takes 1.5 hours to infuse)   Possible side effects (more common) . Loose bowel movements (diarrhea) that may last for a few days . Bone marrow depression. This is a decrease in the number of white blood cells, red blood cells, and platelets. This may raise your risk of infection, make you tired and weak (fatigue), and raise your risk of bleeding. . Nausea and throwing up (vomiting). These symptoms may happen within a few hours or many hours after your treatment and may last up to 24 hours. Medicines are available to stop or lessen these side effects. . Hair loss: Most often hair loss is temporary; your hair should grow back when treatment is done.   Possible side effects (less common) . Skin and tissue irritation. You may have redness, pain, warmth, or swelling at the IV site. . Weakness . Trouble Breathing . Decreased Appetite (decreased hunger)    Treating side effects . Drink 6-8 cups of fluids each  day unless your doctor has told you to limit your fluid intake due to some other health problem. A cup is 8 ounces of fluid. If you throw up or have loose bowel movements, you should drink more fluids so that you do not become dehydrated (lack water in the body from losing too much fluid). . Ask your doctor or nurse about medicine that is available to help stop or lessen the loose bowel movements. . Talk with your nurse about getting a wig before you lose your hair. Also, call the [employer] at 800-ACS-2345 to find out information about the "Look Good, Feel Better" program close to where you live. It is a free program where women getting chemotherapy can learn about wigs, turbans and scarves as well as makeup techniques and skin and nail care. . Use effective methods of birth control during your cancer treatment. Talk with your doctor or nurse about options for birth control. . Vaginal lubricants can be used to lessen vaginal dryness, itching, and pain during sexual relations.   Food and drug interactions There are no known interactions of irinotecan hydrochloride with food. This drug may interact with other medicines. Tell your doctor and pharmacist about all the medicines and dietary supplements (vitamins, minerals, herbs and others) that you are taking at this time. The safety and use of dietary supplements and alternative diets are often not known. Using these might affect your cancer or interfere with your treatment. Until more is known, you should not use dietary supplements or alternative diets without your cancer doctor's help.   When to call the doctor Call your doctor or nurse right away if you have any of these symptoms: . Loose bowel movements (diarrhea) more than 5 or 6 times a day or loose bowel movements with weakness or feeling lightheaded . Temperature of 100.4 F (38 C) or above . Chills . Trouble breathing or  feeling short of breath . Easy bruising or bleeding . Nausea that stops you from eating or drinking . Throwing up more than three times in one day . Redness, pain, warmth, or swelling at the IV site . Feeling dizzy, lightheaded, or if you pass out   Call your doctor or nurse as soon as possible if you have any of these symptoms: . Nausea that is not relieved by prescribed medicines . Extreme tiredness that interferes with normal activities   Sexual problems and reproduction concerns . Infertility warning: Sexual problems and reproduction concerns may happen. In both men and women, this drug may affect your ability to have children. This cannot be determined before your treatment. Talk with your doctor or nurse if you plan to have children. Ask for information on sperm or egg banking. . In men, this drug may interfere with your ability to make sperm, but it should not change your ability to have sexual relations. . In women, menstrual bleeding may become irregular or stop while you are getting this drug. Do not assume that you cannot become pregnant if you do not have a menstrual period. . Women may go through signs of menopause (change of life) like vaginal dryness or itching. Vaginal lubricants can be used to lessen vaginal dryness, itching, and pain during sexual relations. . Genetic counseling is available for you to talk about the effects of this drug therapy on future pregnancies. Also, a genetic counselor can look at the possible risk of problems in the unborn baby due to this medicine if an exposure happens during pregnancy. Marland Kitchen  Pregnancy warning: This drug may have harmful effects on the unborn child, so effective methods of birth control should be used during your cancer treatment. . Breast feeding warning: Women should not breast feed during treatment because this drug could enter the breast milk and badly harm a breast feeding baby.     5-Fluorouracil (Adrucil; 5FU)  About This  Drug  Fluorouracil is used to treat cancer. It is given in the vein (IV). It is given as an IV push from a syringe and also as a continuous infusion given via an ambulatory pump (a pump you take home and wear for a specified amount of time).  Possible Side Effects  . Bone marrow suppression. This is a decrease in the number of white blood cells, red blood cells, and platelets. This may raise your risk of infection, make you tired and weak (fatigue), and raise your risk of bleeding  . Changes in the tissue of the heart and/or heart attack. Some changes may happen that can cause your heart to have less ability to pump blood.  . Blurred vision or other changes in eyesight  . Nausea and throwing up (vomiting)  . Diarrhea (loose bowel movements)  . Ulcers - sores that may cause pain or bleeding in your digestive tract, which includes your mouth, esophagus, stomach, small/large intestines and rectum  . Soreness of the mouth and throat. You may have red areas, white patches, or sores that hurt.  . Allergic reactions, including anaphylaxis are rare but may happen in some patients. Signs of allergic reaction to this drug may be swelling of the face, feeling like your tongue or throat are swelling, trouble breathing, rash, itching, fever, chills, feeling dizzy, and/or feeling that your heart is beating in a fast or not normal way. If this happens, do not take another dose of this drug. You should get urgent medical treatment.  . Sensitivity to light (photosensitivity). Photosensitivity means that you may become more sensitive to the sun and/or light. You may get a skin rash/reaction if you are in the sun or are exposed to sun lamps and tanning beds. Your eyes may water more, mostly in bright light.  . Changes in your nail color, nail loss and/or brittle nail  . Darkening of the skin, or changes to the color of your skin and/or veins used for infusion  . Rash, dry skin, or itching  Note:  Not all possible side effects are included above.  Warnings and Precautions  . Hand-and-foot syndrome. The palms of your hands or soles of your feet may tingle, become numb, painful, swollen, or red.  . Changes in your central nervous system can happen. The central nervous system is made up of your brain and spinal cord. You could feel extreme tiredness, agitation, confusion, hallucinations (see or hear things that are not there), trouble understanding or speaking, loss of control of your bowels or bladder, eyesight changes, numbness or lack of strength to your arms, legs, face, or body, or coma. If you start to have any of these symptoms let your doctor know right away.  . Side effects of this drug may be unexpectedly severe in some patients  Note: Some of the side effects above are very rare. If you have concerns and/or questions, please discuss them with your medical team.  Important Information  . This drug may be present in the saliva, tears, sweat, urine, stool, vomit, semen, and vaginal secretions. Talk to your doctor and/or your nurse about the necessary precautions  to take during this time.  Treating Side Effects  . Manage tiredness by pacing your activities for the day.  . Be sure to include periods of rest between energy-draining activities.  . To help decrease the risk of infections, wash your hands regularly.  . Avoid close contact with people who have a cold, the flu, or other infections.  . Take your temperature as your doctor or nurse tells you, and whenever you feel like you may have a fever.  . Use a soft toothbrush. Check with your nurse before using dental floss.  . Be very careful when using knives or tools.  . Use an electric shaver instead of a razor.  . If you have a nose bleed, sit with your head tipped slightly forward. Apply pressure by lightly pinching the bridge of your nose between your thumb and forefinger. Call your doctor if you feel dizzy  or faint or if the bleeding doesn't stop after 10 to 15 minutes.  . Drink plenty of fluids (a minimum of eight glasses per day is recommended).  . If you throw up or have loose bowel movements, you should drink more fluids so that you do not  become dehydrated (lack of water in the body from losing too much fluid).  . To help with nausea and vomiting, eat small, frequent meals instead of three large meals a day. Choose foods and drinks that are at room temperature. Ask your nurse or doctor about other helpful tips and medicine that is available to help, stop, or lessen these symptoms.  . If you have diarrhea, eat low-fiber foods that are high in protein and calories and avoid foods that can irritate your digestive tracts or lead to cramping.  . Ask your nurse or doctor about medicine that can lessen or stop your diarrhea.  . Mouth care is very important. Your mouth care should consist of routine, gentle cleaning of your teeth or dentures and rinsing your mouth with a mixture of 1/2 teaspoon of salt in 8 ounces of water or 1/2 teaspoon of baking soda in 8 ounces of water. This should be done at least after each meal and at bedtime.  . If you have mouth sores, avoid mouthwash that has alcohol. Also avoid alcohol and smoking because they can bother your mouth and throat.  Marland Kitchen Keeping your nails moisturized may help with brittleness.  . To help with itching, moisturize your skin several times day.  . Use sunscreen with SPF 30 or higher when you are outdoors even for a short time. Cover up when you are out in the sun. Wear wide-brimmed hats, long-sleeved shirts, and pants. Keep your neck, chest, and back covered. Wear dark sun glasses when in the sun or bright lights.  . If you get a rash do not put anything on it unless your doctor or nurse says you may. Keep the area around the rash clean and dry. Ask your doctor for medicine if your rash bothers you.  Marland Kitchen Keeping your pain under control  is important to your well-being. Please tell your doctor or nurse if you are experiencing pain.  Food and Drug Interactions  . There are no known interactions of fluorouracil with food.  . Check with your doctor or pharmacist about all other prescription medicines and over-the-counter medicines and dietary supplements (vitamins, minerals, herbs and others) you are taking before starting this medicine as there are known drug interactions with 5-fluoroucacil. Also, check with your doctor or pharmacist before starting  any new prescription or over-the-counter medicines, or dietary supplements to make sure that there are no interactions.  When to Call the Doctor  Call your doctor or nurse if you have any of these symptoms and/or any new or unusual symptoms:  . Fever of 100.4 F (38 C) or higher  . Chills  . Easy bleeding or bruising  . Nose bleed that doesn't stop bleeding after 10-15 minutes  . Trouble breathing  . Feeling dizzy or lightheaded  . Feeling that your heart is beating in a fast or not normal way (palpitations)  . Chest pain or symptoms of a heart attack. Most heart attacks involve pain in the center of the chest that lasts more than a few minutes. The pain may go away and come back or it can be constant. It can feel like pressure, squeezing, fullness, or pain. Sometimes pain is felt in one or both arms, the back, neck, jaw, or stomach. If any of these symptoms last 2 minutes, call 911.  Marland Kitchen Confusion and/or agitation  . Hallucinations  . Trouble understanding or speaking  . Loss of control of bowels or bladder  . Blurry vision or changes in your eyesight  . Headache that does not go away  . Numbness or lack of strength to your arms, legs, face, or body  . Nausea that stops you from eating or drinking and/or is not relieved by prescribed medicines  . Throwing up more than 3 times a day  . Diarrhea, 4 times in one day or diarrhea with lack of strength or a  feeling of being dizzy  . Pain in your mouth or throat that makes it hard to eat or drink  . Pain along the digestive tract - especially if worse after eating  . Blood in your vomit (bright red or coffee-ground) and/or stools (bright red, or black/tarry)  . Coughing up blood  . Tiredness that interferes with your daily activities  . Painful, red, or swollen areas on your hands or feet or around your nails  . A new rash or a rash that is not relieved by prescribed medicines  . Develop sensitivity to sunlight/light  . Numbness and/or tingling of your hands and/or feet  . Signs of allergic reaction: swelling of the face, feeling like your tongue or throat are swelling, trouble breathing, rash, itching, fever, chills, feeling dizzy, and/or feeling that your heart is beating in a fast or not normal way. If this happens, call 911 for emergency care.  . If you think you are pregnant or may have impregnated your partner  Reproduction Warnings  . Pregnancy warning: This drug may have harmful effects on the unborn baby. Women of child bearing potential should use effective methods of birth control during your cancer treatment and 3 months after treatment. Men with male partners of childbearing potential should use effective methods of birth control during your cancer treatment and for 3 months after your cancer treatment. Let your doctor know right away if you think you may be pregnant or may have impregnated your partner.  . Breastfeeding warning: It is not known if this drug passes into breast milk. For this reason, Women should not breastfeed during treatment because this drug could enter the breast milk and cause harm to a breastfeeding baby.  . Fertility warning: In men and women both, this drug may affect your ability to have children in the future. Talk with your doctor or nurse if you plan to have children. Ask for  information on sperm or egg banking.  Bevacizumab  (Avastin)  About This Drug Bevacizumab is used to treat cancer. It is given in the vein (IV).  Possible Side Effects . Teary eyes . Runny/stuffy nose . Nosebleed . Changes in the way food and drinks taste . Headache . Back pain . Protein in your urine . Bleeding in your rectum . Dry skin . A red skin rash which can be peeling or scaling . High blood pressure  Note: Each of the side effects above was reported in 10% or greater of patients treated with bevacizumab-xxxx. Not all possible side effects are included above.  Warnings and Precautions  . Perforation or fistula- an abnormal hole in your stomach, intestine, esophagus, or other organ, which can be life-threatening . Slow wound healing, which can be life-threatening . Abnormal bleeding which can be life-threatening - symptoms may be coughing up blood, throwing up blood (may look like coffee grounds), red or black tarry bowel movements, abnormally heavy menstrual flow, nosebleeds or any other unusual bleeding. . Blood clots and events such as stroke and heart attack. A blood clot in your leg may cause your leg to swell, appear red and warm, and/or cause pain. A blood clot in your lungs may cause trouble breathing, pain when breathing, and/or chest pain. . Severe high blood pressure . Changes in your central nervous system can happen. The central nervous system is made up of your brain and spinal cord. You could feel extreme tiredness, agitation, confusion, hallucinations . This drug may interact with other medicines. Tell your doctor and pharmacist about all the medicines and dietary supplements (vitamins, minerals, herbs and others) that you are taking at this time. Also, check with your doctor or pharmacist before starting any new prescription or over-thecounter medicines, or dietary supplements to make sure that there are no interactions.  When to Call the Doctor Call your doctor or nurse if you have any of these  symptoms and/or any new or unusual symptoms:  . Fever of 100.4 F (38 C) or higher . Chills . Confusion and/or agitation . Hallucinations . Trouble understanding or speaking . Headache that does not go away . Nose bleed that doesn't stop bleeding after 10 -15 minutes . Feeling dizzy or lightheaded . Blurry vision or changes in your eyesight . Difficulty swallowing . Easy bleeding or bruising . Blood in your urine, vomit (bright red or coffee-ground) and/or stools ( bright red, or black/tarry) . Coughing up blood . Wheezing and/or trouble breathing . Chest pain or symptoms of a heart attack. Most heart attacks involve pain in the center of the chest that lasts more than a few minutes. The pain may go away and come back. It can feel like pressure, squeezing, fullness, or pain. Sometimes pain is felt in one or both arms, the back, neck, jaw, or stomach. If any of these symptoms last 2 minutes, call 911. Marland Kitchen Symptoms of a stroke such as sudden numbness or weakness of your face, arm, or leg, mostly on one side of your body; sudden confusion, trouble speaking or understanding; sudden trouble seeing in one or both eyes; sudden trouble walking, feeling dizzy, loss of balance or coordination; or sudden, bad headache with no known cause. If you have any of these symptoms for 2 minutes, call 911. . Numbness or lack of strength to your arms, legs, face, or body . Nausea that stops you from eating or drinking and/or relieved by prescribed medicine . Throwing up more than 3  times a day . Pain in your abdomen that does not go away . Foamy or bubbly-looking urine . Signs of infusion reaction: fever or shaking chills, flushing, facial swelling, feeling dizzy, headache, trouble breathing, rash, itching, chest tightness, or chest pain. . Pain that does not go away or is not relieved by prescribed medicine . Your leg or arm is swollen, red, warm and/or painful . Swelling of arms, hands, legs and/or  feet . Weight gain of 5 pounds in one week (fluid retention) . If you think you may be pregnant  Reproduction Warnings . Pregnancy warning: This drug can have harmful effects on the unborn baby. Women of child bearing potential should use effective methods of birth control during your cancer treatment and for 6 months after treatment. In women, changes in your ovaries may happen that may cause menstrual bleeding to become irregular or stop, do not assume you cannot get pregnant. Let your doctor know right away if you think you may be pregnant . Breastfeeding warning: Women should not breastfeed during treatment and for 6 months after treatment because this drug could enter the breast milk and cause harm to a breastfeeding baby. . Fertility warning: In women, this drug may affect your ability to have children in the future. Talk with your doctor or nurse if you plan to have children. Ask for information on egg banking.  SELF CARE ACTIVITIES WHILE ON CHEMOTHERAPY:  Hydration Increase your fluid intake 48 hours prior to treatment and drink at least 8 to 12 cups (64 ounces) of water/decaffeinated beverages per day after treatment. You can still have your cup of coffee or soda but these beverages do not count as part of your 8 to 12 cups that you need to drink daily. No alcohol intake.  Medications Continue taking your normal prescription medication as prescribed.  If you start any new herbal or new supplements please let us know first to make sure it is safe.  Mouth Care Have teeth cleaned professionally before starting treatment. Keep dentures and partial plates clean. Use soft toothbrush and do not use mouthwashes that contain alcohol. Biotene is a good mouthwash that is available at most pharmacies or may be ordered by calling [phone]. Use warm salt water gargles (1 teaspoon salt per 1 quart warm water) before and after meals and at bedtime. Or you may rinse with 2 tablespoons of  three-percent hydrogen peroxide mixed in eight ounces of water. If you are still having problems with your mouth or sores in your mouth please call the clinic. If you need dental work, please let the doctor know before you go for your appointment so that we can coordinate the best possible time for you in regards to your chemo regimen. You need to also let your dentist know that you are actively taking chemo. We may need to do labs prior to your dental appointment.  Skin Care Always use sunscreen that has not expired and with SPF (Sun Protection Factor) of 50 or higher. Wear hats to protect your head from the sun. Remember to use sunscreen on your hands, ears, face, & feet.  Use good moisturizing lotions such as udder cream, eucerin, or even Vaseline. Some chemotherapies can cause dry skin, color changes in your skin and nails.    . Avoid long, hot showers or baths. . Use gentle, fragrance-free soaps and laundry detergent. . Use moisturizers, preferably creams or ointments rather than lotions because the thicker consistency is better at preventing skin dehydration. Apply  the cream or ointment within 15 minutes of showering. Reapply moisturizer at night, and moisturize your hands every time after you wash them.  Hair Loss (if your doctor says your hair will fall out)  . If your doctor says that your hair is likely to fall out, decide before you begin chemo whether you want to wear a wig. You may want to shop before treatment to match your hair color. . Hats, turbans, and scarves can also camouflage hair loss, although some people prefer to leave their heads uncovered. If you go bare-headed outdoors, be sure to use sunscreen on your scalp. . Cut your hair short. It eases the inconvenience of shedding lots of hair, but it also can reduce the emotional impact of watching your hair fall out. . Don't perm or color your hair during chemotherapy. Those chemical treatments are already damaging to hair and can  enhance hair loss. Once your chemo treatments are done and your hair has grown back, it's OK to resume dyeing or perming hair.  With chemotherapy, hair loss is almost always temporary. But when it grows back, it may be a different color or texture. In older adults who still had hair color before chemotherapy, the new growth may be completely gray.  Often, new hair is very fine and soft.  Infection Prevention Please wash your hands for at least 30 seconds using warm soapy water. Handwashing is the #1 way to prevent the spread of germs. Stay away from sick people or people who are getting over a cold. If you develop respiratory systems such as green/yellow mucus production or productive cough or persistent cough let us know and we will see if you need an antibiotic. It is a good idea to keep a pair of gloves on when going into grocery stores/[employer] to decrease your risk of coming into contact with germs on the carts, etc. Carry alcohol hand gel with you at all times and use it frequently if out in public. If your temperature reaches 100.5 or higher please call the clinic and let us know.  If it is after hours or on the weekend please go to the ER if your temperature is over 100.5.  Please have your own personal thermometer at home to use.    Sex and bodily fluids If you are going to have sex, a condom must be used to protect the person that isn't taking chemotherapy. Chemo can decrease your libido (sex drive). For a few days after chemotherapy, chemotherapy can be excreted through your bodily fluids.  When using the toilet please close the lid and flush the toilet twice.  Do this for a few day after you have had chemotherapy.   Effects of chemotherapy on your sex life Some changes are simple and won't last long. They won't affect your sex life permanently.  Sometimes you may feel: . too tired . not strong enough to be very active . sick or sore  . not in the mood . anxious or low Your anxiety might  not seem related to sex. For example, you may be worried about the cancer and how your treatment is going. Or you may be worried about money, or about how you family are coping with your illness. These things can cause stress, which can affect your interest in sex. It's important to talk to your partner about how you feel. Remember - the changes to your sex life don't usually last long. There's usually no medical reason to stop having sex  during chemo. The drugs won't have any long term physical effects on your performance or enjoyment of sex. Cancer can't be passed on to your partner during sex  Contraception It's important to use reliable contraception during treatment. Avoid getting pregnant while you or your partner are having chemotherapy. This is because the drugs may harm the baby. Sometimes chemotherapy drugs can leave a man or woman infertile.  This means you would not be able to have children in the future. You might want to talk to someone about permanent infertility. It can be very difficult to learn that you may no longer be able to have children. Some people find counselling helpful. There might be ways to preserve your fertility, although this is easier for men than for women. You may want to speak to a fertility expert. You can talk about sperm banking or harvesting your eggs. You can also ask about other fertility options, such as donor eggs. If you have or have had breast cancer, your doctor might advise you not to take the contraceptive pill. This is because the hormones in it might affect the cancer.  It is not known for sure whether or not chemotherapy drugs can be passed on through semen or secretions from the vagina. Because of this some doctors advise people to use a barrier method if you have sex during treatment. This applies to vaginal, anal or oral sex. Generally, doctors advise a barrier method only for the time you are actually having the treatment and for about a week after  your treatment. Advice like this can be worrying, but this does not mean that you have to avoid being intimate with your partner. You can still have close contact with your partner and continue to enjoy sex.  Animals If you have cats or birds we just ask that you not change the litter or change the cage.  Please have someone else do this for you while you are on chemotherapy.   Food Safety During and After Cancer Treatment Food safety is important for people both during and after cancer treatment. Cancer and cancer treatments, such as chemotherapy, radiation therapy, and stem cell/bone marrow transplantation, often weaken the immune system. This makes it harder for your body to protect itself from foodborne illness, also called food poisoning. Foodborne illness is caused by eating food that contains harmful bacteria, parasites, or viruses.  Foods to avoid Some foods have a higher risk of becoming tainted with bacteria. These include: Marland Kitchen Unwashed fresh fruit and vegetables, especially leafy vegetables that can hide dirt and other contaminants . Raw sprouts, such as alfalfa sprouts . Raw or undercooked beef, especially ground beef, or other raw or undercooked meat and poultry . Fatty, fried, or spicy foods immediately before or after treatment.  These can sit heavy on your stomach and make you feel nauseous. . Raw or undercooked shellfish, such as oysters. . Sushi and sashimi, which often contain raw fish.  . Unpasteurized beverages, such as unpasteurized fruit juices, raw milk, raw yogurt, or cider . Undercooked eggs, such as soft boiled, over easy, and poached; raw, unpasteurized eggs; or foods made with raw egg, such as homemade raw cookie dough and homemade mayonnaise  Simple steps for food safety  Shop smart. . Do not buy food stored or displayed in an unclean area. . Do not buy bruised or damaged fruits or vegetables. . Do not buy cans that have cracks, dents, or bulges. . Pick up foods  that can spoil at the  end of your shopping trip and store them in a cooler on the way home.  Prepare and clean up foods carefully. . Rinse all fresh fruits and vegetables under running water, and dry them with a clean towel or paper towel. . Clean the top of cans before opening them. . After preparing food, wash your hands for 20 seconds with hot water and soap. Pay special attention to areas between fingers and under nails. . Clean your utensils and dishes with hot water and soap. Marland Kitchen Disinfect your kitchen and cutting boards using 1 teaspoon of liquid, unscented bleach mixed into 1 quart of water.    Dispose of old food. . Eat canned and packaged food before its expiration date (the "use by" or "best before" date). . Consume refrigerated leftovers within 3 to 4 days. After that time, throw out the food. Even if the food does not smell or look spoiled, it still may be unsafe. Some bacteria, such as Listeria, can grow even on foods stored in the refrigerator if they are kept for too long.  Take precautions when eating out. . At restaurants, avoid buffets and salad bars where food sits out for a long time and comes in contact with many people. Food can become contaminated when someone with a virus, often a norovirus, or another "bug" handles it. . Put any leftover food in a "to-go" container yourself, rather than having the server do it. And, refrigerate leftovers as soon as you get home. . Choose restaurants that are clean and that are willing to prepare your food as you order it cooked.   MEDICATIONS:                                                                                                                                                                Compazine/Prochlorperazine 10mg  tablet. Take 1 tablet every 6 hours as needed for nausea/vomiting. (This can make you sleepy)   EMLA cream. Apply a quarter size amount to port site 1 hour prior to chemo. Do not rub in. Cover with plastic  wrap.   Over-the-Counter Meds:  Colace - 100 mg capsules - take 2 capsules daily.  If this doesn't help then you can increase to 2 capsules twice daily.  Call us if this does not help your bowels move.   Imodium 2mg  capsule. Take 2 capsules after the 1st loose stool and then 1 capsule every 2 hours until you go a total of 12 hours without having a loose stool. Call the [facility] if loose stools continue. If diarrhea occurs at bedtime, take 2 capsules at bedtime. Then take 2 capsules every 4 hours until morning. Call [facility].    Diarrhea Sheet   If you are having loose stools/diarrhea, please purchase Imodium and  begin taking as outlined:  At the first sign of poorly formed or loose stools you should begin taking Imodium (loperamide) 2 mg capsules.  Take two tablets (4mg ) followed by one tablet (2mg ) every 2 hours - DO NOT EXCEED 8 tablets in 24 hours.  If it is bedtime and you are having loose stools, take 2 tablets at bedtime, then 2 tablets every 4 hours until morning.   Always call the [facility] if you are having loose stools/diarrhea that you can't get under control.  Loose stools/diarrhea leads to dehydration (loss of water) in your body.  We have other options of trying to get the loose stools/diarrhea to stop but you must let us know!   Constipation Sheet  Colace - 100 mg capsules - take 2 capsules daily.  If this doesn't help then you can increase to 2 capsules twice daily.  Please call if the above does not work for you.   Do not go more than 2 days without a bowel movement.  It is very important that you do not become constipated.  It will make you feel sick to your stomach (nausea) and can cause abdominal pain and vomiting.   Nausea Sheet   Compazine/Prochlorperazine 10mg  tablet. Take 1 tablet every 6 hours as needed for nausea/vomiting. (This can make you sleepy)  If you are having persistent nausea (nausea that does not stop) please call the [facility]  and let us know the amount of nausea that you are experiencing.  If you begin to vomit, you need to call the [facility] and if it is the weekend and you have vomited more than one time and can't get it to stop-go to the Emergency Room.  Persistent nausea/vomiting can lead to dehydration (loss of fluid in your body) and will make you feel terrible.   Ice chips, sips of clear liquids, foods that are @ room temperature, crackers, and toast tend to be better tolerated.   SYMPTOMS TO REPORT AS SOON AS POSSIBLE AFTER TREATMENT:   FEVER GREATER THAN 100.5 F  CHILLS WITH OR WITHOUT FEVER  NAUSEA AND VOMITING THAT IS NOT CONTROLLED WITH YOUR NAUSEA MEDICATION  UNUSUAL SHORTNESS OF BREATH  UNUSUAL BRUISING OR BLEEDING  TENDERNESS IN MOUTH AND THROAT WITH OR WITHOUT PRESENCE OF ULCERS  URINARY PROBLEMS  BOWEL PROBLEMS  UNUSUAL RASH      Wear comfortable clothing and clothing appropriate for easy access to any Portacath or PICC line. Let us know if there is anything that we can do to make your therapy better!    What to do if you need assistance after hours or on the weekends: CALL [phone].  HOLD on the line, do not hang up.  You will hear multiple messages but at the end you will be connected with a nurse triage line.  They will contact the doctor if necessary.  Most of the time they will be able to assist you.  Do not call the hospital operator.      I have been informed and understand all of the instructions given to me and have received a copy. I have been instructed to call the clinic [phone] or my family physician as soon as possible for continued medical care, if indicated. I do not have any more questions at this time but understand that I may call the [facility] or the Patient Navigator at [phone] during office hours should I have questions or need assistance in obtaining follow-up care.

## 2018-08-10 NOTE — Progress Notes (Signed)
I spoke with Suanne Marker in pathology and ordered foundation one on accession [id number].  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

## 2018-08-10 NOTE — Assessment & Plan Note (Signed)
1.  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 - Completion colonoscopy on 07/10/2016 had few polyps removed. - Recent CEA found to be elevated at 36.7.  He also reported ribbonlike stools and blood-streaked stools. - CT CAP on 07/07/2018 showed isolated segment 4B hepatic metastasis measuring 3.8 x 3.4 cm.  Involvement of locally recurrent regional disease including 2.2 x 2.5 cm area of soft tissue thickening along the posterior portion of the surgical site, extending into the presacral space.  8 mm perirectal lymph node at 3 o'clock position is enlarged from 5 on the prior. -MRI of the abdomen on 07/14/2018 shows segment 4B liver lesion measuring 2.9 x 3.6 x 3.2 cm.  No other metastatic lesions seen. -PET scan on 07/15/2018 shows hypermetabolic segment 4 liver lesion with SUV of 10.4.  Hypermetabolism corresponding to the previously described sigmoid colon recurrence and direct extension into the presacral space with SUV of 16.4. - He was evaluated by Dr. Jyl Heinz at [facility] and recommended neoadjuvant chemotherapy and rescanning after 4 cycles. - Liver biopsy on 08/04/2018 shows metastatic adenocarcinoma consistent with colon primary. -I have recommended foundation 1 testing on the liver biopsy specimen. - We had a prolonged discussion about management of his stage IV oligo metastatic sigmoid colon cancer.  With neoadjuvant chemotherapy, we can achieve downsizing of the primary as well as metastatic site, for effective surgical resection.  I have quoted about 35% cure rate with this approach. -I have recommended combination chemotherapy with FOLFOXIRI every 2 weeks for at least 4 cycles before restaging scans.  I plan to also add bevacizumab to the regimen for high response rates. -We talked about side effects of this regimen in detail.   Of chemotherapy nurse will give him more education. -We plan to start his treatment next week.

## 2018-08-10 NOTE — Progress Notes (Signed)
[place], [place] [address]   CLINIC:  Medical Oncology/Hematology  PCP:  [name], [place] [address] [phone]   REASON FOR VISIT:  Follow-up for Adenocarcinoma of sigmoid colon    BRIEF ONCOLOGIC HISTORY:  Oncology History  Adenocarcinoma of sigmoid colon ([place])  11/20/2015 Imaging   CT abd/pelvis- Focal sigmoid diverticulitis is noted without abscess formation. Focal narrowing and wall thickening of the sigmoid colon is noted in this area most likely due to inflammation, but neoplasm cannot be excluded, and sigmoidoscopy is recommended for further evaluation.   03/20/2016 Procedure   Colonoscopy by Dr. Laural Golden- Malignant partially obstructing tumor in the distal sigmoid colon. Biopsied. - Incomplete exam.   03/23/2016 Pathology Results   Colon, biopsy, sigmoid mass - ADENOCARCINOMA.   03/23/2016 Procedure   Low anterior resection with transanal anastomosis   03/26/2016 Pathology Results   Colon, segmental resection for tumor, sigmoid - INVASIVE WELL DIFFERENTIATED ADENOCARCINOMA, SPANNING 5 CM IN GREATEST DIMENSION. - TUMOR INVADES THROUGH MUSCULARIS PROPRIA TO INVOLVE SUBSEROSAL SOFT TISSUES. - UNDERLYING MARKED ACUTE AND CHRONIC INFLAMMATION WITH GIANT CELL REACTION AND ABSCESS FORMATION EXTENDING TO THE SEROSAL SURFACE AND INVOLVING THE MESENTERIC TISSUE. - MARGINS ARE NEGATIVE FOR TUMOR. - FOURTEEN BENIGN LYMPH NODES WHICH DEMONSTRATE VARIABLE INVOLVEMENT WITH ACUTE AND CHRONIC INFLAMMATION WITH ASSOCIATED GIANT CELLS WITHOUT TUMOR IDENTIFIED (0/14).   04/08/2016 Cancer Staging   Cancer Staging Adenocarcinoma of sigmoid colon [facility]) Staging form: Colon and Rectum, AJCC 8th Edition - Pathologic stage from 04/01/2016: Stage IIA (pT3, pN0, cM0) - Signed by Baird Cancer, PA-C on 04/01/2016    07/09/2016 Imaging   CT abd/pelvis- No evidence of metastatic disease or other acute findings.   Bilateral nonobstructing renal calculi and mild bilateral renal parenchymal scarring.  Stable mildly enlarged prostate gland and findings of chronic bladder outlet obstruction.      CANCER STAGING: Cancer Staging Adenocarcinoma of sigmoid colon [facility]) Staging form: Colon and Rectum, AJCC 8th Edition - Pathologic stage from 04/01/2016: Stage IIA (pT3, pN0, cM0) - Signed by Baird Cancer, PA-C on 04/01/2016    INTERVAL HISTORY:  Troy Smith 63 y.o. male seen for further management of metastatic sigmoid colon cancer to the liver.  He was recently evaluated by Dr. Jyl Heinz at [facility].  He was recommended to have neoadjuvant chemotherapy and reimaging after 4 cycles to see potential resectability.  He also had liver biopsy done on 08/04/2018.  He denies any back pain or blood in the stools.  Denies any neuropathy in the extremities.  Denies any right upper quadrant pain.  Appetite and energy levels are 100%.   REVIEW OF SYSTEMS:  Review of Systems  All other systems reviewed and are negative.    PAST MEDICAL/SURGICAL HISTORY:  Past Medical History:  Diagnosis Date  . Adenocarcinoma of sigmoid colon ([place]) 03/23/2016  . Carpal tunnel syndrome 05/02/2014   Bilateral  . Cervical disc disorder with radiculopathy of cervical region 05/02/2014   Left C8  . Diabetes mellitus without complication ([place])   . Hypertension    Past Surgical History:  Procedure Laterality Date  . BIOPSY  03/20/2016   Procedure: BIOPSY;  Surgeon: Rogene Houston, MD;  Location[facility];  Service: Endoscopy;;  colon  . BOWEL RESECTION N/A 03/23/2016   Procedure: LOW ANTERIOR RESECTION;  Surgeon: Aviva Signs, MD;  Location: [facility] ORS;  Service: General;  Laterality: N/A;  . COLONOSCOPY N/A  03/20/2016   Procedure: COLONOSCOPY;  Surgeon: Rogene Houston, MD;  Location: [facility];  Service: Endoscopy;  Laterality: N/A;  730  . COLONOSCOPY N/A 07/10/2016   Procedure:  COLONOSCOPY;  Surgeon: Rogene Houston, MD;  Location: [facility];  Service: Endoscopy;  Laterality: N/A;  1040  . NO PAST SURGERIES    . POLYPECTOMY  07/10/2016   Procedure: POLYPECTOMY;  Surgeon: Rogene Houston, MD;  Location: [facility];  Service: Endoscopy;;  colon  . PORTACATH PLACEMENT Left 08/05/2018   Procedure: INSERTION PORT-A-CATH;  Surgeon: Aviva Signs, MD;  Location: [facility] ORS;  Service: General;  Laterality: Left;  . TONSILLECTOMY       SOCIAL HISTORY:  Social History   Socioeconomic History  . Marital status: Married    Spouse name: Not on file  . Number of children: Not on file  . Years of education: Not on file  . Highest education level: Not on file  Occupational History  . Not on file  Social Needs  . Financial resource strain: Not on file  . Food insecurity    Worry: Not on file    Inability: Not on file  . Transportation needs    Medical: Not on file    Non-medical: Not on file  Tobacco Use  . Smoking status: Never Smoker  . Smokeless tobacco: Never Used  Substance and Sexual Activity  . Alcohol use: Yes    Comment: rarely  . Drug use: No  . Sexual activity: Not on file  Lifestyle  . Physical activity    Days per week: Not on file    Minutes per session: Not on file  . Stress: Not on file  Relationships  . Social Herbalist on phone: Not on file    Gets together: Not on file    Attends religious service: Not on file    Active member of club or organization: Not on file    Attends meetings of clubs or organizations: Not on file    Relationship status: Not on file  . Intimate partner violence    Fear of current or ex partner: Not on file    Emotionally abused: Not on file    Physically abused: Not on file    Forced sexual activity: Not on file  Other Topics Concern  . Not on file  Social History Narrative  . Not on file    FAMILY HISTORY:  Family History  Problem Relation Age of Onset  . COPD Mother   . Melanoma Father      CURRENT MEDICATIONS:  Outpatient Encounter Medications as of 08/10/2018  Medication Sig  . amLODipine (NORVASC) 10 MG tablet Take 10 mg by mouth daily.  . hydrochlorothiazide (HYDRODIURIL) 25 MG tablet Take 25 mg by mouth daily.   Marland Kitchen HYDROcodone-acetaminophen (NORCO) 5-325 MG tablet Take 1 tablet by mouth every 4 (four) hours as needed for moderate pain.  Marland Kitchen insulin glargine (LANTUS) 100 UNIT/ML injection Inject 30 Units into the skin at bedtime.   . metoprolol succinate (TOPROL-XL) 50 MG 24 hr tablet Take 50 mg by mouth daily. Take with or immediately following a meal.   No facility-administered encounter medications on file as of 08/10/2018.     ALLERGIES:  No Known Allergies   PHYSICAL EXAM:  ECOG Performance status: 0  Vitals:   08/10/18 0800  BP: 139/88  Pulse: 88  Resp: 16  Temp: (!) 96.8 F (36 C)  SpO2: 95%  Filed Weights   08/10/18 0800  Weight: 223 lb 7 oz (101.4 kg)    Physical Exam Vitals signs reviewed.  Constitutional:      Appearance: Normal appearance.  Cardiovascular:     Rate and Rhythm: Normal rate and regular rhythm.     Heart sounds: Normal heart sounds.  Pulmonary:     Effort: Pulmonary effort is normal.     Breath sounds: Normal breath sounds.  Abdominal:     General: There is no distension.     Palpations: Abdomen is soft. There is no mass.  Musculoskeletal:        General: No swelling.  Skin:    General: Skin is warm.  Neurological:     General: No focal deficit present.     Mental Status: He is alert and oriented to person, place, and time.  Psychiatric:        Mood and Affect: Mood normal.        Behavior: Behavior normal.      LABORATORY DATA:  I have reviewed the labs as listed.  CBC    Component Value Date/Time   WBC 9.6 08/04/2018 1200   RBC 4.78 08/04/2018 1200   HGB 13.5 08/04/2018 1200   HCT 40.6 08/04/2018 1200   PLT 241 08/04/2018 1200   MCV 84.9 08/04/2018 1200   MCH 28.2 08/04/2018 1200   MCHC 33.3  08/04/2018 1200   RDW 13.5 08/04/2018 1200   LYMPHSABS 2.0 07/19/2018 1037   MONOABS 0.5 07/19/2018 1037   EOSABS 0.3 07/19/2018 1037   BASOSABS 0.0 07/19/2018 1037   CMP Latest Ref Rng & Units 07/19/2018 06/17/2018 04/14/2017  Glucose 70 - 99 mg/dL 193(H) 122(H) 204(H)  BUN 8 - 23 mg/dL 26(H) 22 20  Creatinine 0.61 - 1.24 mg/dL 1.47(H) 1.29(H) 1.22  Sodium 135 - 145 mmol/L 141 141 139  Potassium 3.5 - 5.1 mmol/L 3.8 3.9 4.2  Chloride 98 - 111 mmol/L 103 103 99(L)  CO2 22 - 32 mmol/L '26 28 27  ' Calcium 8.9 - 10.3 mg/dL 10.2 10.0 9.9  Total Protein 6.5 - 8.1 g/dL 7.2 7.3 7.4  Total Bilirubin 0.3 - 1.2 mg/dL 1.1 0.8 1.0  Alkaline Phos 38 - 126 U/L 50 50 69  AST 15 - 41 U/L '16 20 17  ' ALT 0 - 44 U/L '17 18 19       ' DIAGNOSTIC IMAGING:  I have independently reviewed the scans and discussed with the patient.   I have reviewed Venita Lick LPN's note and agree with the documentation.  I personally performed a face-to-face visit, made revisions and my assessment and plan is as follows.    ASSESSMENT & PLAN:   Adenocarcinoma of sigmoid colon ([place]) 1.  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 - Completion colonoscopy on 07/10/2016 had few polyps removed. - Recent CEA found to be elevated at 36.7.  He also reported ribbonlike stools and blood-streaked stools. - CT CAP on 07/07/2018 showed isolated segment 4B hepatic metastasis measuring 3.8 x 3.4 cm.  Involvement of locally recurrent regional disease including 2.2 x 2.5 cm area of soft tissue thickening along the posterior portion of the surgical site, extending into the presacral space.  8 mm perirectal lymph node at 3 o'clock position is enlarged from 5 on the prior. -MRI of the abdomen on 07/14/2018 shows segment 4B liver lesion measuring 2.9 x  3.6 x 3.2 cm.  No other metastatic lesions  seen. -PET scan on 07/15/2018 shows hypermetabolic segment 4 liver lesion with SUV of 10.4.  Hypermetabolism corresponding to the previously described sigmoid colon recurrence and direct extension into the presacral space with SUV of 16.4. - He was evaluated by Dr. Jyl Heinz at [facility] and recommended neoadjuvant chemotherapy and rescanning after 4 cycles. - Liver biopsy on 08/04/2018 shows metastatic adenocarcinoma consistent with colon primary. -I have recommended foundation 1 testing on the liver biopsy specimen. - We had a prolonged discussion about management of his stage IV oligo metastatic sigmoid colon cancer.  With neoadjuvant chemotherapy, we can achieve downsizing of the primary as well as metastatic site, for effective surgical resection.  I have quoted about 35% cure rate with this approach. -I have recommended combination chemotherapy with FOLFOXIRI every 2 weeks for at least 4 cycles before restaging scans.  I plan to also add bevacizumab to the regimen for high response rates. -We talked about side effects of this regimen in detail.  Of chemotherapy nurse will give him more education. -We plan to start his treatment next week.   Total time spent is 40 minutes with more than 50% of the time spent face-to-face discussing scan results, further management plan and coordination of care.  Orders placed this encounter:  No orders of the defined types were placed in this encounter.     Derek Jack, MD [place] [phone]

## 2018-08-10 NOTE — Progress Notes (Signed)
START OFF PATHWAY REGIMEN - Colorectal   [id number]:FOLFOXIRI + Bevacizumab (Leucovorin IV + Fluorouracil IV/CIV + Oxaliplatin IV + Irinotecan IV + Bevacizumab IV) q14 Days:   A cycle is every 14 days:     Bevacizumab-xxxx      Irinotecan      Oxaliplatin      Leucovorin      Fluorouracil   **Always confirm dose/schedule in your pharmacy ordering system**  Patient Characteristics: Preoperative or Nonsurgical Candidate (Clinical Staging), Distant Metastasis Tumor Location: Colon Therapeutic Status: Preoperative or Nonsurgical Candidate (Clinical Staging) AJCC T Category: cTX AJCC N Category: cNX AJCC M Category: cM1a AJCC 8 Stage Grouping: IVA Intent of Therapy: Curative Intent, Discussed with Patient

## 2018-08-10 NOTE — Patient Instructions (Addendum)
[place] at [facility] Discharge Instructions  You were seen today by Dr. Delton Coombes. He went over your recent test results and plan of care. He will see you back next wednesday for labs, treatment and follow up.   Thank you for choosing [place] [facility] to provide your oncology and hematology care.  To afford each patient quality time with our provider, please arrive at least 15 minutes before your scheduled appointment time.   If you have a lab appointment with the [facility] please come in thru the  Main Entrance and check in at the main information desk  You need to re-schedule your appointment should you arrive 10 or more minutes late.  We strive to give you quality time with our providers, and arriving late affects you and other patients whose appointments are after yours.  Also, if you no show three or more times for appointments you may be dismissed from the clinic at the providers discretion.     Again, thank you for choosing [facility].  Our hope is that these requests will decrease the amount of time that you wait before being seen by our physicians.       _____________________________________________________________  Should you have questions after your visit to [facility], please contact our office at [phone] between the hours of 8:00 a.m. and 4:30 p.m.  Voicemails left after 4:00 p.m. will not be returned until the following business day.  For prescription refill requests, have your pharmacy contact our office and allow 72 hours.    Cancer Center Support Programs:   > Cancer Support Group  2nd Tuesday of the month 1pm-2pm, Journey Room

## 2018-08-16 ENCOUNTER — Inpatient Hospital Stay (HOSPITAL_COMMUNITY): Payer: 59

## 2018-08-16 ENCOUNTER — Encounter (HOSPITAL_COMMUNITY): Payer: Self-pay | Admitting: Hematology

## 2018-08-16 ENCOUNTER — Other Ambulatory Visit: Payer: Self-pay

## 2018-08-16 ENCOUNTER — Inpatient Hospital Stay (HOSPITAL_COMMUNITY): Payer: 59 | Admitting: Hematology

## 2018-08-16 VITALS — BP 124/68 | HR 67

## 2018-08-16 DIAGNOSIS — C787 Secondary malignant neoplasm of liver and intrahepatic bile duct: Secondary | ICD-10-CM

## 2018-08-16 DIAGNOSIS — K625 Hemorrhage of anus and rectum: Secondary | ICD-10-CM

## 2018-08-16 DIAGNOSIS — C187 Malignant neoplasm of sigmoid colon: Secondary | ICD-10-CM

## 2018-08-16 DIAGNOSIS — Z5112 Encounter for antineoplastic immunotherapy: Secondary | ICD-10-CM | POA: Diagnosis not present

## 2018-08-16 DIAGNOSIS — Z95828 Presence of other vascular implants and grafts: Secondary | ICD-10-CM

## 2018-08-16 DIAGNOSIS — E119 Type 2 diabetes mellitus without complications: Secondary | ICD-10-CM

## 2018-08-16 DIAGNOSIS — I10 Essential (primary) hypertension: Secondary | ICD-10-CM

## 2018-08-16 LAB — COMPREHENSIVE METABOLIC PANEL
ALT: 16 U/L (ref 0–44)
AST: 16 U/L (ref 15–41)
Albumin: 3.8 g/dL (ref 3.5–5.0)
Alkaline Phosphatase: 56 U/L (ref 38–126)
Anion gap: 12 (ref 5–15)
BUN: 23 mg/dL (ref 8–23)
CO2: 28 mmol/L (ref 22–32)
Calcium: 9.6 mg/dL (ref 8.9–10.3)
Chloride: 101 mmol/L (ref 98–111)
Creatinine, Ser: 1.3 mg/dL — ABNORMAL HIGH (ref 0.61–1.24)
GFR calc Af Amer: >60 mL/min (ref >60)
GFR calc non Af Amer: 58 mL/min — ABNORMAL LOW (ref >60)
Glucose, Bld: 159 mg/dL — ABNORMAL HIGH (ref 70–99)
Potassium: 3.1 mmol/L — ABNORMAL LOW (ref 3.5–5.1)
Sodium: 141 mmol/L (ref 135–145)
Total Bilirubin: 0.9 mg/dL (ref 0.3–1.2)
Total Protein: 6.9 g/dL (ref 6.5–8.1)

## 2018-08-16 LAB — CBC WITH DIFFERENTIAL/PLATELET
Abs Immature Granulocytes: 0.03 10*3/uL (ref 0.00–0.07)
Basophils Absolute: 0 10*3/uL (ref 0.0–0.1)
Basophils Relative: 0 %
Eosinophils Absolute: 0.2 10*3/uL (ref 0.0–0.5)
Eosinophils Relative: 3 %
HCT: 35.2 % — ABNORMAL LOW (ref 39.0–52.0)
Hemoglobin: 12.1 g/dL — ABNORMAL LOW (ref 13.0–17.0)
Immature Granulocytes: 0 %
Lymphocytes Relative: 22 %
Lymphs Abs: 1.9 10*3/uL (ref 0.7–4.0)
MCH: 29.2 pg (ref 26.0–34.0)
MCHC: 34.4 g/dL (ref 30.0–36.0)
MCV: 85 fL (ref 80.0–100.0)
Monocytes Absolute: 0.6 10*3/uL (ref 0.1–1.0)
Monocytes Relative: 7 %
Neutro Abs: 5.8 10*3/uL (ref 1.7–7.7)
Neutrophils Relative %: 68 %
Platelets: 217 10*3/uL (ref 150–400)
RBC: 4.14 MIL/uL — ABNORMAL LOW (ref 4.22–5.81)
RDW: 13.9 % (ref 11.5–15.5)
WBC: 8.5 10*3/uL (ref 4.0–10.5)
nRBC: 0 % (ref 0.0–0.2)

## 2018-08-16 LAB — URINALYSIS, DIPSTICK ONLY
Bilirubin Urine: NEGATIVE
Glucose, UA: 50 mg/dL — AB
Hgb urine dipstick: NEGATIVE
Ketones, ur: NEGATIVE mg/dL
Leukocytes,Ua: NEGATIVE
Nitrite: NEGATIVE
Protein, ur: NEGATIVE mg/dL
Specific Gravity, Urine: 1.014 (ref 1.005–1.030)
pH: 6 (ref 5.0–8.0)

## 2018-08-16 MED ORDER — SODIUM CHLORIDE 0.9 % IV SOLN
165.0000 mg/m2 | Freq: Once | INTRAVENOUS | Status: AC
  Administered 2018-08-16: 380 mg via INTRAVENOUS
  Filled 2018-08-16: qty 15

## 2018-08-16 MED ORDER — SODIUM CHLORIDE 0.9 % IV SOLN
5.0000 mg/kg | Freq: Once | INTRAVENOUS | Status: AC
  Administered 2018-08-16: 500 mg via INTRAVENOUS
  Filled 2018-08-16: qty 4

## 2018-08-16 MED ORDER — PALONOSETRON HCL INJECTION 0.25 MG/5ML
INTRAVENOUS | Status: AC
  Filled 2018-08-16: qty 5

## 2018-08-16 MED ORDER — SODIUM CHLORIDE 0.9 % IV SOLN
2400.0000 mg/m2 | INTRAVENOUS | Status: DC
  Administered 2018-08-16: 5400 mg via INTRAVENOUS
  Filled 2018-08-16: qty 108

## 2018-08-16 MED ORDER — LEUCOVORIN CALCIUM INJECTION 350 MG
400.0000 mg/m2 | Freq: Once | INTRAVENOUS | Status: AC
  Administered 2018-08-16: 900 mg via INTRAVENOUS
  Filled 2018-08-16: qty 45

## 2018-08-16 MED ORDER — ATROPINE SULFATE 1 MG/ML IJ SOLN
INTRAMUSCULAR | Status: AC
  Filled 2018-08-16: qty 1

## 2018-08-16 MED ORDER — SODIUM CHLORIDE 0.9% FLUSH
10.0000 mL | INTRAVENOUS | Status: DC | PRN
  Administered 2018-08-16: 10 mL
  Filled 2018-08-16: qty 10

## 2018-08-16 MED ORDER — DEXTROSE 5 % IV SOLN
Freq: Once | INTRAVENOUS | Status: AC
  Administered 2018-08-16: 09:00:00 via INTRAVENOUS

## 2018-08-16 MED ORDER — PALONOSETRON HCL INJECTION 0.25 MG/5ML
0.2500 mg | Freq: Once | INTRAVENOUS | Status: AC
  Administered 2018-08-16: 0.25 mg via INTRAVENOUS

## 2018-08-16 MED ORDER — SODIUM CHLORIDE 0.9 % IV SOLN
Freq: Once | INTRAVENOUS | Status: AC
  Administered 2018-08-16: 09:00:00 via INTRAVENOUS
  Filled 2018-08-16: qty 5

## 2018-08-16 MED ORDER — OXALIPLATIN CHEMO INJECTION 100 MG/20ML
85.0000 mg/m2 | Freq: Once | INTRAVENOUS | Status: AC
  Administered 2018-08-16: 190 mg via INTRAVENOUS
  Filled 2018-08-16: qty 38

## 2018-08-16 MED ORDER — ATROPINE SULFATE 1 MG/ML IJ SOLN
0.5000 mg | Freq: Once | INTRAMUSCULAR | Status: AC | PRN
  Administered 2018-08-16: 0.5 mg via INTRAVENOUS

## 2018-08-16 NOTE — Assessment & Plan Note (Addendum)
1.  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  Completion colonoscopy on 07/10/2016 and had few polyps removed. - Recent CEA found to be elevated at 36.7.  He also reported ribbonlike stools and blood-streaked stools. -CT CAP on 07/07/2018 shows isolated segment 4B hepatic metastasis measuring 3.8 x 3.4 cm.  Involvement of locally recurrent regional disease including 2.2 x 2.5 cm area of soft tissue thickening along the posterior portion of the surgical site, extending into the presacral space.  8 mm perirectal lymph node at 3 o'clock position is enlarged from 5 mm on the prior. -MRI of the abdomen on 07/14/2018 shows segment 4B liver lesion measuring 2.9 x 3.6 x 3.2 cm.  No other metastatic lesions. -PET scan on 07/15/2018 shows hypermetabolic segment 4 liver lesion with SUV of 10.4.  Hypermetabolic him corresponding to the previously described sigmoid colon resection with direct extension into the presacral space with SUV of 16.4. - Dr. Jyl Heinz at [facility] recommended neoadjuvant chemotherapy and rescanning after 4 cycles. -Liver biopsy on 08/04/2018 confirmed metastatic adenocarcinoma. - We have sent foundation 1 testing on the liver biopsy specimen. - We had a prolonged discussion about management of stage IV oligo metastatic sigmoid colon cancer.  With neoadjuvant chemotherapy, we can achieve downsizing of the primary as well as the metastatic site, for effective surgical resection. - I have recommended combination chemotherapy with FOLFOXIRI and bevacizumab.  We talked about the side effects in detail. -I have reviewed his blood work.  He may proceed with his cycle 1 today.  2.  Bleeding per rectum: -He did not have any bleeding per rectum for the last 3 to 4 weeks.  He had occasional blood-streaked stool prior to that. -We have  discussed the possibility of worsening of the rectal bleeding with bevacizumab on board.  We will monitor him closely.

## 2018-08-16 NOTE — Progress Notes (Signed)
Troy Smith tolerated chemo treatment without incident or complaint. VSS prior to and after administration. 5FU infusing through home infusion pump without difficulties. Chemo spill kit given to patient to take home. Discharged self ambulatory in satisfactory condition.

## 2018-08-16 NOTE — Patient Instructions (Signed)
[facility] Discharge Instructions for Patients Receiving Chemotherapy   Beginning January 23rd 2017 lab work for the [facility] will be done in the  Main lab at [facility] on 1st floor. If you have a lab appointment with the [facility] please come in thru the  Main Entrance and check in at the main information desk   Today you received the following chemotherapy agents Avastin, Oxaliplatin, Irinotecan, Leucovoring, and 5FU.  To help prevent nausea and vomiting after your treatment, we encourage you to take your nausea medication   If you develop nausea and vomiting, or diarrhea that is not controlled by your medication, call the clinic.  The clinic phone number is [phone]. Office hours are Monday-Friday 8:30am-5:00pm.  BELOW ARE SYMPTOMS THAT SHOULD BE REPORTED IMMEDIATELY:  *FEVER GREATER THAN 101.0 F  *CHILLS WITH OR WITHOUT FEVER  NAUSEA AND VOMITING THAT IS NOT CONTROLLED WITH YOUR NAUSEA MEDICATION  *UNUSUAL SHORTNESS OF BREATH  *UNUSUAL BRUISING OR BLEEDING  TENDERNESS IN MOUTH AND THROAT WITH OR WITHOUT PRESENCE OF ULCERS  *URINARY PROBLEMS  *BOWEL PROBLEMS  UNUSUAL RASH Items with * indicate a potential emergency and should be followed up as soon as possible. If you have an emergency after office hours please contact your primary care physician or go to the nearest emergency department.  Please call the clinic during office hours if you have any questions or concerns.   You may also contact the Patient Navigator at [phone] should you have any questions or need assistance in obtaining follow up care.      Resources For Cancer Patients and their Caregivers ? American Cancer Society: Can assist with transportation, wigs, general needs, runs Look Good Feel Better.        703 884 9518 ? Cancer Care: Provides financial assistance, online support groups, medication/co-pay assistance.  1-800-813-HOPE 630-385-1196) ? [facility] Assists [place] cancer patients and their families through emotional , educational and financial support.  302-870-1606 ? [place] DSS Where to apply for food stamps, Medicaid and utility assistance. 269-663-0556 ? [facility]: Transportation to medical appointments. 6131498451 ? Social Security Administration: May apply for disability if have a Stage IV cancer. 6185597730 432-116-5134 ? [employer], Disability and Transit Services: Assists with nutrition, care and transit needs. ([phone])564-0254

## 2018-08-16 NOTE — Progress Notes (Signed)
Chemotherapy/immunotherapy education packet given and discussed with pt in detail.  Discussed diagnosis and staging, and tx regimen.  Reviewed chemotherapy and immunotherapy medications and side effects, as well as pre-medications.  Instructed on how to manage side effects at home, and when to call the clinic.  Importance of fever/chills discussed with pt and family. Discussed precautions to implement at home after receiving tx, as well as self care strategies. Phone numbers provided for clinic during regular working hours, also how to reach the clinic after hours and on weekends. Pt provided the opportunity to ask questions - all questions answered to pt's satisfaction.

## 2018-08-16 NOTE — Progress Notes (Signed)
Troy Smith,  [id number]   CLINIC:  Medical Oncology/Hematology  PCP:  [name], [place] [address] [phone]   REASON FOR VISIT:  Follow-up for Adenocarcinoma of sigmoid colon    BRIEF ONCOLOGIC HISTORY:  Oncology History  Adenocarcinoma of sigmoid colon ([place])  11/20/2015 Imaging   CT abd/pelvis- Focal sigmoid diverticulitis is noted without abscess formation. Focal narrowing and wall thickening of the sigmoid colon is noted in this area most likely due to inflammation, but neoplasm cannot be excluded, and sigmoidoscopy is recommended for further evaluation.   03/20/2016 Procedure   Colonoscopy by Dr. Laural Golden- Malignant partially obstructing tumor in the distal sigmoid colon. Biopsied. - Incomplete exam.   03/23/2016 Pathology Results   Colon, biopsy, sigmoid mass - ADENOCARCINOMA.   03/23/2016 Procedure   Low anterior resection with transanal anastomosis   03/26/2016 Pathology Results   Colon, segmental resection for tumor, sigmoid - INVASIVE WELL DIFFERENTIATED ADENOCARCINOMA, SPANNING 5 CM IN GREATEST DIMENSION. - TUMOR INVADES THROUGH MUSCULARIS PROPRIA TO INVOLVE SUBSEROSAL SOFT TISSUES. - UNDERLYING MARKED ACUTE AND CHRONIC INFLAMMATION WITH GIANT CELL REACTION AND ABSCESS FORMATION EXTENDING TO THE SEROSAL SURFACE AND INVOLVING THE MESENTERIC TISSUE. - MARGINS ARE NEGATIVE FOR TUMOR. - FOURTEEN BENIGN LYMPH NODES WHICH DEMONSTRATE VARIABLE INVOLVEMENT WITH ACUTE AND CHRONIC INFLAMMATION WITH ASSOCIATED GIANT CELLS WITHOUT TUMOR IDENTIFIED (0/14).   04/08/2016 Cancer Staging   Cancer Staging Adenocarcinoma of sigmoid colon [facility]) Staging form: Colon and Rectum, AJCC 8th Edition - Pathologic stage from 04/01/2016: Stage IIA (pT3, pN0, cM0) - Signed by Baird Cancer, PA-C on 04/01/2016    07/09/2016 Imaging   CT abd/pelvis- No evidence of metastatic disease or other acute  findings.  Bilateral nonobstructing renal calculi and mild bilateral renal parenchymal scarring.  Stable mildly enlarged prostate gland and findings of chronic bladder outlet obstruction.   08/16/2018 -  Chemotherapy   The patient had palonosetron (ALOXI) injection 0.25 mg, 0.25 mg, Intravenous,  Once, 1 of 6 cycles Administration: 0.25 mg (08/16/2018) irinotecan (CAMPTOSAR) 380 mg in sodium chloride 0.9 % 500 mL chemo infusion, 165 mg/m2 = 380 mg, Intravenous,  Once, 1 of 6 cycles Administration: 380 mg (08/16/2018) leucovorin 900 mg in dextrose 5 % 250 mL infusion, 400 mg/m2 = 900 mg, Intravenous,  Once, 1 of 6 cycles Administration: 900 mg (08/16/2018) oxaliplatin (ELOXATIN) 190 mg in dextrose 5 % 500 mL chemo infusion, 85 mg/m2 = 190 mg, Intravenous,  Once, 1 of 6 cycles Administration: 190 mg (08/16/2018) fosaprepitant (EMEND) 150 mg, dexamethasone (DECADRON) 12 mg in sodium chloride 0.9 % 145 mL IVPB, , Intravenous,  Once, 1 of 6 cycles Administration:  (08/16/2018) fluorouracil (ADRUCIL) 5,400 mg in sodium chloride 0.9 % 142 mL chemo infusion, 2,400 mg/m2 = 5,400 mg, Intravenous, 1 Day/Dose, 1 of 6 cycles bevacizumab-awwb (MVASI) 500 mg in sodium chloride 0.9 % 100 mL chemo infusion, 5 mg/kg = 500 mg (100 % of original dose 5 mg/kg), Intravenous,  Once, 1 of 6 cycles Dose modification: 5 mg/kg (original dose 5 mg/kg, Cycle 1) Administration: 500 mg (08/16/2018)  for chemotherapy treatment.       CANCER STAGING: Cancer Staging Adenocarcinoma of sigmoid colon [facility]) Staging form: Colon and Rectum, AJCC 8th Edition - Pathologic stage from 04/01/2016: Stage IIA (pT3, pN0, cM0) - Signed by Baird Cancer, PA-C on 04/01/2016    INTERVAL HISTORY:  Troy Smith 63 y.o. male seen for follow-up and initiate chemotherapy for his metastatic colon  cancer.  He denies any tingling or numbness in the extremities.  Denies any bleeding per rectum at this time.  Appetite is 100%.  Energy levels are 75%.   Pain is reported as 0.  No longer has low back pain.  Denies any nausea vomiting diarrhea or constipation.  REVIEW OF SYSTEMS:  Review of Systems  All other systems reviewed and are negative.    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[place]) 03/23/2016   Carpal tunnel syndrome 05/02/2014   Bilateral   Cervical disc disorder with radiculopathy of cervical region 05/02/2014   Left C8   Diabetes mellitus without complication ([place])    Hypertension    Port-A-Cath in place 08/10/2018   Past Surgical History:  Procedure Laterality Date   BIOPSY  03/20/2016   Procedure: BIOPSY;  Surgeon: Rogene Houston, MD;  [facility];  Service: Endoscopy;;  colon   BOWEL RESECTION N/A 03/23/2016   Procedure: LOW ANTERIOR RESECTION;  Surgeon: Aviva Signs, MD;  Location: [facility] ORS;  Service: General;  Laterality: N/A;   COLONOSCOPY N/A 03/20/2016   Procedure: COLONOSCOPY;  Surgeon: Rogene Houston, MD;  Location: [facility];  Service: Endoscopy;  Laterality: N/A;  730   COLONOSCOPY N/A 07/10/2016   Procedure: COLONOSCOPY;  Surgeon: Rogene Houston, MD;  Location: [facility];  Service: Endoscopy;  Laterality: N/A;  1040   NO PAST SURGERIES     POLYPECTOMY  07/10/2016   Procedure: POLYPECTOMY;  Surgeon: Rogene Houston, MD;  Location: [facility];  Service: Endoscopy;;  colon   PORTACATH PLACEMENT Left 08/05/2018   Procedure: INSERTION PORT-A-CATH;  Surgeon: Aviva Signs, MD;  Location: [facility] ORS;  Service: General;  Laterality: Left;   TONSILLECTOMY       SOCIAL HISTORY:  Social History   Socioeconomic History   Marital status: Married    Spouse name: Not on file   Number of children: Not on file   Years of education: Not on file   Highest education level: Not on file  Occupational History   Not on file  Social Needs   Financial resource strain: Not on file   Food insecurity    Worry: Not on file     Inability: Not on file   Transportation needs    Medical: Not on file    Non-medical: Not on file  Tobacco Use   Smoking status: Never Smoker   Smokeless tobacco: Never Used  Substance and Sexual Activity   Alcohol use: Yes    Comment: rarely   Drug use: No   Sexual activity: Not on file  Lifestyle   Physical activity    Days per week: Not on file    Minutes per session: Not on file   Stress: Not on file  Relationships   Social connections    Talks on phone: Not on file    Gets together: Not on file    Attends religious service: Not on file    Active member of club or organization: Not on file    Attends meetings of clubs or organizations: Not on file    Relationship status: Not on file   Intimate partner violence    Fear of current or ex partner: Not on file    Emotionally abused: Not on file    Physically abused: Not on file    Forced sexual activity: Not on file  Other Topics Concern  Not on file  Social History Narrative   Not on file    FAMILY HISTORY:  Family History  Problem Relation Age of Onset   COPD Mother    Melanoma Father     CURRENT MEDICATIONS:  Outpatient Encounter Medications as of 08/16/2018  Medication Sig   amLODipine (NORVASC) 10 MG tablet Take 10 mg by mouth daily.   Bevacizumab (AVASTIN IV) Inject into the vein every 14 (fourteen) days.   dextrose 5 % SOLN 1,000 mL with fluorouracil 5 GM/100ML SOLN Inject into the vein over 48 hr.   FLUOROURACIL IV Inject into the vein every 14 (fourteen) days.   hydrochlorothiazide (HYDRODIURIL) 25 MG tablet Take 25 mg by mouth daily.    HYDROcodone-acetaminophen (NORCO) 5-325 MG tablet Take 1 tablet by mouth every 4 (four) hours as needed for moderate pain.   insulin glargine (LANTUS) 100 UNIT/ML injection Inject 30 Units into the skin at bedtime.    IRINOTECAN HCL IV Inject into the vein every 14 (fourteen) days.   LEUCOVORIN CALCIUM IV Inject into the vein every 14 (fourteen)  days.   lidocaine-prilocaine (EMLA) cream Apply a small amount to port a cath site and cover with plastic wrap 1 hour prior to chemotherapy appointments   loperamide (IMODIUM A-D) 2 MG tablet Take 2 at onset of diarrhea, then 1 after each watery bowel movement.   metoprolol succinate (TOPROL-XL) 50 MG 24 hr tablet Take 50 mg by mouth daily. Take with or immediately following a meal.   OXALIPLATIN IV Inject into the vein every 14 (fourteen) days.   prochlorperazine (COMPAZINE) 10 MG tablet Take 1 tablet (10 mg total) by mouth every 6 (six) hours as needed (Nausea or vomiting).   No facility-administered encounter medications on file as of 08/16/2018.     ALLERGIES:  Allergies  Allergen Reactions   Penicillin G Nausea And Vomiting     PHYSICAL EXAM:  ECOG Performance status: 0  Vitals:   08/16/18 0801 08/16/18 0818  BP: 131/73 113/80  Pulse: 73 76  Resp: 18 16  Temp: (!) 97.2 F (36.2 C) (!) 97.2 F (36.2 C)  SpO2: 99% 98%   Filed Weights   08/16/18 0801  Weight: 221 lb (100.2 kg)    Physical Exam Vitals signs reviewed.  Constitutional:      Appearance: Normal appearance.  Cardiovascular:     Rate and Rhythm: Normal rate and regular rhythm.     Heart sounds: Normal heart sounds.  Pulmonary:     Effort: Pulmonary effort is normal.     Breath sounds: Normal breath sounds.  Abdominal:     General: There is no distension.     Palpations: Abdomen is soft. There is no mass.  Musculoskeletal:        General: No swelling.  Skin:    General: Skin is warm.  Neurological:     General: No focal deficit present.     Mental Status: He is alert and oriented to person, place, and time.  Psychiatric:        Mood and Affect: Mood normal.        Behavior: Behavior normal.      LABORATORY DATA:  I have reviewed the labs as listed.  CBC    Component Value Date/Time   WBC 8.5 08/16/2018 0801   RBC 4.14 (L) 08/16/2018 0801   HGB 12.1 (L) 08/16/2018 0801   HCT 35.2 (L)  08/16/2018 0801   PLT 217 08/16/2018 0801   MCV 85.0 08/16/2018  0801   MCH 29.2 08/16/2018 0801   MCHC 34.4 08/16/2018 0801   RDW 13.9 08/16/2018 0801   LYMPHSABS 1.9 08/16/2018 0801   MONOABS 0.6 08/16/2018 0801   EOSABS 0.2 08/16/2018 0801   BASOSABS 0.0 08/16/2018 0801   CMP Latest Ref Rng & Units 08/16/2018 07/19/2018 06/17/2018  Glucose 70 - 99 mg/dL 159(H) 193(H) 122(H)  BUN 8 - 23 mg/dL 23 26(H) 22  Creatinine 0.61 - 1.24 mg/dL 1.30(H) 1.47(H) 1.29(H)  Sodium 135 - 145 mmol/L 141 141 141  Potassium 3.5 - 5.1 mmol/L 3.1(L) 3.8 3.9  Chloride 98 - 111 mmol/L 101 103 103  CO2 22 - 32 mmol/L '28 26 28  ' Calcium 8.9 - 10.3 mg/dL 9.6 10.2 10.0  Total Protein 6.5 - 8.1 g/dL 6.9 7.2 7.3  Total Bilirubin 0.3 - 1.2 mg/dL 0.9 1.1 0.8  Alkaline Phos 38 - 126 U/L 56 50 50  AST 15 - 41 U/L '16 16 20  ' ALT 0 - 44 U/L '16 17 18       ' DIAGNOSTIC IMAGING:  I have independently reviewed the scans and discussed with the patient.   I have reviewed Venita Lick LPN's note and agree with the documentation.  I personally performed a face-to-face visit, made revisions and my assessment and plan is as follows.    ASSESSMENT & PLAN:   Adenocarcinoma of sigmoid colon ([place]) 1.  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  Completion colonoscopy on 07/10/2016 and had few polyps removed. - Recent CEA found to be elevated at 36.7.  He also reported ribbonlike stools and blood-streaked stools. -CT CAP on 07/07/2018 shows isolated segment 4B hepatic metastasis measuring 3.8 x 3.4 cm.  Involvement of locally recurrent regional disease including 2.2 x 2.5 cm area of soft tissue thickening along the posterior portion of the surgical site, extending into the presacral space.  8 mm perirectal lymph node at 3 o'clock position is enlarged from 5 mm on the  prior. -MRI of the abdomen on 07/14/2018 shows segment 4B liver lesion measuring 2.9 x 3.6 x 3.2 cm.  No other metastatic lesions. -PET scan on 07/15/2018 shows hypermetabolic segment 4 liver lesion with SUV of 10.4.  Hypermetabolic him corresponding to the previously described sigmoid colon resection with direct extension into the presacral space with SUV of 16.4. - Dr. Jyl Heinz at [facility] recommended neoadjuvant chemotherapy and rescanning after 4 cycles. -Liver biopsy on 08/04/2018 confirmed metastatic adenocarcinoma. - We have sent foundation 1 testing on the liver biopsy specimen. - We had a prolonged discussion about management of stage IV oligo metastatic sigmoid colon cancer.  With neoadjuvant chemotherapy, we can achieve downsizing of the primary as well as the metastatic site, for effective surgical resection. - I have recommended combination chemotherapy with FOLFOXIRI and bevacizumab.  We talked about the side effects in detail. -I have reviewed his blood work.  He may proceed with his cycle 1 today.  2.  Bleeding per rectum: -He did not have any bleeding per rectum for the last 3 to 4 weeks.  He had occasional blood-streaked stool prior to that. -We have discussed the possibility of worsening of the rectal bleeding with bevacizumab on board.  We will monitor him closely.   Total time spent is 40 minutes with more than 50% of the time spent face-to-face discussing scan results, further management plan and coordination  of care.  Orders placed this encounter:  No orders of the defined types were placed in this encounter.     Derek Jack, MD [place] [phone]

## 2018-08-16 NOTE — Patient Instructions (Addendum)
[place] at [facility] Discharge Instructions  You were seen today by Dr. Delton Coombes. He went over your recent lab results. He will see you back in 2 week for labs and follow up.   Thank you for choosing [place] at [facility] to provide your oncology and hematology care.  To afford each patient quality time with our provider, please arrive at least 15 minutes before your scheduled appointment time.   If you have a lab appointment with the [facility] please come in thru the  Main Entrance and check in at the main information desk  You need to re-schedule your appointment should you arrive 10 or more minutes late.  We strive to give you quality time with our providers, and arriving late affects you and other patients whose appointments are after yours.  Also, if you no show three or more times for appointments you may be dismissed from the clinic at the providers discretion.     Again, thank you for choosing [facility].  Our hope is that these requests will decrease the amount of time that you wait before being seen by our physicians.       _____________________________________________________________  Should you have questions after your visit to [facility], please contact our office at [phone] between the hours of 8:00 a.m. and 4:30 p.m.  Voicemails left after 4:00 p.m. will not be returned until the following business day.  For prescription refill requests, have your pharmacy contact our office and allow 72 hours.    Cancer Center Support Programs:   > Cancer Support Group  2nd Tuesday of the month 1pm-2pm, Journey Room

## 2018-08-16 NOTE — Progress Notes (Signed)
08/15/18  Received notification from PA team insurance requires patient receive biosimilar - Mvasi and not Avastin.  Orders changed to reflect new medication Mvasi.  Henreitta Leber, PharmD

## 2018-08-17 LAB — CEA: CEA: 76 ng/mL — ABNORMAL HIGH (ref 0.0–4.7)

## 2018-08-18 ENCOUNTER — Inpatient Hospital Stay (HOSPITAL_COMMUNITY): Payer: 59

## 2018-08-18 ENCOUNTER — Other Ambulatory Visit: Payer: Self-pay

## 2018-08-18 VITALS — BP 132/82 | HR 80 | Temp 97.1°F | Resp 18

## 2018-08-18 DIAGNOSIS — Z95828 Presence of other vascular implants and grafts: Secondary | ICD-10-CM

## 2018-08-18 DIAGNOSIS — Z5112 Encounter for antineoplastic immunotherapy: Secondary | ICD-10-CM | POA: Diagnosis not present

## 2018-08-18 DIAGNOSIS — C187 Malignant neoplasm of sigmoid colon: Secondary | ICD-10-CM

## 2018-08-18 MED ORDER — PEGFILGRASTIM INJECTION 6 MG/0.6ML ~~LOC~~
6.0000 mg | PREFILLED_SYRINGE | Freq: Once | SUBCUTANEOUS | Status: AC
  Administered 2018-08-18: 6 mg via SUBCUTANEOUS
  Filled 2018-08-18: qty 0.6

## 2018-08-18 MED ORDER — HEPARIN SOD (PORK) LOCK FLUSH 100 UNIT/ML IV SOLN
500.0000 [IU] | Freq: Once | INTRAVENOUS | Status: AC | PRN
  Administered 2018-08-18: 500 [IU]

## 2018-08-18 MED ORDER — SODIUM CHLORIDE 0.9% FLUSH
10.0000 mL | INTRAVENOUS | Status: DC | PRN
  Administered 2018-08-18: 10 mL
  Filled 2018-08-18: qty 10

## 2018-08-18 NOTE — Progress Notes (Signed)
Patient doing well at the completion of the 56fu completion. Patient discharged ambulatory out of clinic by self.

## 2018-08-18 NOTE — Progress Notes (Signed)
Neulasta approved per Angie. Neulasta not entered into tx plan until today. Neulasta given in the abdomen.

## 2018-08-22 ENCOUNTER — Encounter (HOSPITAL_COMMUNITY): Payer: Self-pay | Admitting: Hematology

## 2018-08-22 ENCOUNTER — Inpatient Hospital Stay (HOSPITAL_COMMUNITY): Payer: 59

## 2018-08-22 ENCOUNTER — Telehealth (HOSPITAL_COMMUNITY): Payer: Self-pay | Admitting: Surgery

## 2018-08-22 ENCOUNTER — Inpatient Hospital Stay (HOSPITAL_BASED_OUTPATIENT_CLINIC_OR_DEPARTMENT_OTHER): Payer: 59 | Admitting: Hematology

## 2018-08-22 ENCOUNTER — Other Ambulatory Visit: Payer: Self-pay

## 2018-08-22 ENCOUNTER — Encounter (HOSPITAL_COMMUNITY): Payer: Self-pay

## 2018-08-22 VITALS — BP 136/85 | HR 73 | Temp 97.3°F | Resp 20 | Wt 210.0 lb

## 2018-08-22 DIAGNOSIS — C787 Secondary malignant neoplasm of liver and intrahepatic bile duct: Secondary | ICD-10-CM

## 2018-08-22 DIAGNOSIS — R110 Nausea: Secondary | ICD-10-CM

## 2018-08-22 DIAGNOSIS — C187 Malignant neoplasm of sigmoid colon: Secondary | ICD-10-CM

## 2018-08-22 DIAGNOSIS — Z5112 Encounter for antineoplastic immunotherapy: Secondary | ICD-10-CM | POA: Diagnosis not present

## 2018-08-22 LAB — CBC WITH DIFFERENTIAL/PLATELET
Abs Immature Granulocytes: 0.03 10*3/uL (ref 0.00–0.07)
Basophils Absolute: 0 10*3/uL (ref 0.0–0.1)
Basophils Relative: 1 %
Eosinophils Absolute: 0 10*3/uL (ref 0.0–0.5)
Eosinophils Relative: 1 %
HCT: 37.8 % — ABNORMAL LOW (ref 39.0–52.0)
Hemoglobin: 13 g/dL (ref 13.0–17.0)
Immature Granulocytes: 1 %
Lymphocytes Relative: 18 %
Lymphs Abs: 1.1 10*3/uL (ref 0.7–4.0)
MCH: 28.8 pg (ref 26.0–34.0)
MCHC: 34.4 g/dL (ref 30.0–36.0)
MCV: 83.8 fL (ref 80.0–100.0)
Monocytes Absolute: 0.3 10*3/uL (ref 0.1–1.0)
Monocytes Relative: 6 %
Neutro Abs: 4.3 10*3/uL (ref 1.7–7.7)
Neutrophils Relative %: 73 %
Platelets: 185 10*3/uL (ref 150–400)
RBC: 4.51 MIL/uL (ref 4.22–5.81)
RDW: 13.1 % (ref 11.5–15.5)
WBC Morphology: INCREASED
WBC: 5.8 10*3/uL (ref 4.0–10.5)
nRBC: 0 % (ref 0.0–0.2)

## 2018-08-22 LAB — COMPREHENSIVE METABOLIC PANEL
ALT: 24 U/L (ref 0–44)
AST: 19 U/L (ref 15–41)
Albumin: 3.8 g/dL (ref 3.5–5.0)
Alkaline Phosphatase: 83 U/L (ref 38–126)
Anion gap: 13 (ref 5–15)
BUN: 32 mg/dL — ABNORMAL HIGH (ref 8–23)
CO2: 25 mmol/L (ref 22–32)
Calcium: 9.4 mg/dL (ref 8.9–10.3)
Chloride: 93 mmol/L — ABNORMAL LOW (ref 98–111)
Creatinine, Ser: 1.85 mg/dL — ABNORMAL HIGH (ref 0.61–1.24)
GFR calc Af Amer: 44 mL/min — ABNORMAL LOW (ref >60)
GFR calc non Af Amer: 38 mL/min — ABNORMAL LOW (ref >60)
Glucose, Bld: 217 mg/dL — ABNORMAL HIGH (ref 70–99)
Potassium: 4 mmol/L (ref 3.5–5.1)
Sodium: 131 mmol/L — ABNORMAL LOW (ref 135–145)
Total Bilirubin: 1.1 mg/dL (ref 0.3–1.2)
Total Protein: 7 g/dL (ref 6.5–8.1)

## 2018-08-22 LAB — MAGNESIUM: Magnesium: 1.9 mg/dL (ref 1.7–2.4)

## 2018-08-22 MED ORDER — SODIUM CHLORIDE 0.9 % IV SOLN
INTRAVENOUS | Status: DC
  Administered 2018-08-22: 15:00:00 via INTRAVENOUS

## 2018-08-22 MED ORDER — HEPARIN SOD (PORK) LOCK FLUSH 100 UNIT/ML IV SOLN
500.0000 [IU] | Freq: Once | INTRAVENOUS | Status: AC
  Administered 2018-08-22: 500 [IU] via INTRAVENOUS

## 2018-08-22 MED ORDER — ONDANSETRON 8 MG PO TBDP: 8.0000 mg | ORAL_TABLET | Freq: Three times a day (TID) | ORAL | 2 refills | Status: DC | PRN

## 2018-08-22 MED ORDER — ONDANSETRON HCL 4 MG/2ML IJ SOLN
8.0000 mg | Freq: Once | INTRAMUSCULAR | Status: AC
  Administered 2018-08-22: 8 mg via INTRAVENOUS
  Filled 2018-08-22: qty 4

## 2018-08-22 NOTE — Progress Notes (Signed)
[place], [place] [address]   CLINIC:  Medical Oncology/Hematology  PCP:  [name], [place] [address] [phone]   REASON FOR VISIT:  Follow-up for Adenocarcinoma of sigmoid colon    BRIEF ONCOLOGIC HISTORY:  Oncology History  Adenocarcinoma of sigmoid colon ([place])  11/20/2015 Imaging   CT abd/pelvis- Focal sigmoid diverticulitis is noted without abscess formation. Focal narrowing and wall thickening of the sigmoid colon is noted in this area most likely due to inflammation, but neoplasm cannot be excluded, and sigmoidoscopy is recommended for further evaluation.   03/20/2016 Procedure   Colonoscopy by Dr. Laural Golden- Malignant partially obstructing tumor in the distal sigmoid colon. Biopsied. - Incomplete exam.   03/23/2016 Pathology Results   Colon, biopsy, sigmoid mass - ADENOCARCINOMA.   03/23/2016 Procedure   Low anterior resection with transanal anastomosis   03/26/2016 Pathology Results   Colon, segmental resection for tumor, sigmoid - INVASIVE WELL DIFFERENTIATED ADENOCARCINOMA, SPANNING 5 CM IN GREATEST DIMENSION. - TUMOR INVADES THROUGH MUSCULARIS PROPRIA TO INVOLVE SUBSEROSAL SOFT TISSUES. - UNDERLYING MARKED ACUTE AND CHRONIC INFLAMMATION WITH GIANT CELL REACTION AND ABSCESS FORMATION EXTENDING TO THE SEROSAL SURFACE AND INVOLVING THE MESENTERIC TISSUE. - MARGINS ARE NEGATIVE FOR TUMOR. - FOURTEEN BENIGN LYMPH NODES WHICH DEMONSTRATE VARIABLE INVOLVEMENT WITH ACUTE AND CHRONIC INFLAMMATION WITH ASSOCIATED GIANT CELLS WITHOUT TUMOR IDENTIFIED (0/14).   04/08/2016 Cancer Staging   Cancer Staging Adenocarcinoma of sigmoid colon [facility]) Staging form: Colon and Rectum, AJCC 8th Edition - Pathologic stage from 04/01/2016: Stage IIA (pT3, pN0, cM0) - Signed by Baird Cancer, PA-C on 04/01/2016    07/09/2016 Imaging   CT abd/pelvis- No evidence of metastatic disease or other acute findings.   Bilateral nonobstructing renal calculi and mild bilateral renal parenchymal scarring.  Stable mildly enlarged prostate gland and findings of chronic bladder outlet obstruction.   08/16/2018 -  Chemotherapy   The patient had palonosetron (ALOXI) injection 0.25 mg, 0.25 mg, Intravenous,  Once, 1 of 6 cycles Administration: 0.25 mg (08/16/2018) pegfilgrastim (NEULASTA) injection 6 mg, 6 mg, Subcutaneous, Once, 1 of 6 cycles Administration: 6 mg (08/18/2018) irinotecan (CAMPTOSAR) 380 mg in sodium chloride 0.9 % 500 mL chemo infusion, 165 mg/m2 = 380 mg, Intravenous,  Once, 1 of 6 cycles Administration: 380 mg (08/16/2018) leucovorin 900 mg in dextrose 5 % 250 mL infusion, 400 mg/m2 = 900 mg, Intravenous,  Once, 1 of 6 cycles Administration: 900 mg (08/16/2018) oxaliplatin (ELOXATIN) 190 mg in dextrose 5 % 500 mL chemo infusion, 85 mg/m2 = 190 mg, Intravenous,  Once, 1 of 6 cycles Administration: 190 mg (08/16/2018) fosaprepitant (EMEND) 150 mg, dexamethasone (DECADRON) 12 mg in sodium chloride 0.9 % 145 mL IVPB, , Intravenous,  Once, 1 of 6 cycles Administration:  (08/16/2018) fluorouracil (ADRUCIL) 5,400 mg in sodium chloride 0.9 % 142 mL chemo infusion, 2,400 mg/m2 = 5,400 mg, Intravenous, 1 Day/Dose, 1 of 6 cycles Administration: 5,400 mg (08/16/2018) bevacizumab-awwb (MVASI) 500 mg in sodium chloride 0.9 % 100 mL chemo infusion, 5 mg/kg = 500 mg (100 % of original dose 5 mg/kg), Intravenous,  Once, 1 of 6 cycles Dose modification: 5 mg/kg (original dose 5 mg/kg, Cycle 1) Administration: 500 mg (08/16/2018)  for chemotherapy treatment.       CANCER STAGING: Cancer Staging Adenocarcinoma of sigmoid colon [facility]) Staging form: Colon and Rectum, AJCC 8th Edition - Pathologic stage from 04/01/2016: Stage IIA (pT3, pN0, cM0) - Signed by Baird Cancer, PA-C on 04/01/2016  INTERVAL HISTORY:  Mr. Yanik 63 y.o. male seen on an unscheduled visit today.  His wife called told us that he has been having  severe nausea since yesterday.  He denied any vomiting, diarrhea or constipation.  Denied any cold sensitivity or tingling or numbness next images.  Denies any nosebleeds, bleeding per rectum or melena.  He was taking Compazine which is not helping with the nausea.  REVIEW OF SYSTEMS:  Review of Systems  Constitutional: Positive for fatigue.  Gastrointestinal: Positive for nausea.  All other systems reviewed and are negative.    PAST MEDICAL/SURGICAL HISTORY:  Past Medical History:  Diagnosis Date  . Adenocarcinoma of sigmoid colon ([place]) 03/23/2016  . Carpal tunnel syndrome 05/02/2014   Bilateral  . Cervical disc disorder with radiculopathy of cervical region 05/02/2014   Left C8  . Diabetes mellitus without complication ([place])   . Hypertension   . Port-A-Cath in place 08/10/2018   Past Surgical History:  Procedure Laterality Date  . BIOPSY  03/20/2016   Procedure: BIOPSY;  Surgeon: Rogene Houston, MD;  [facility];  Service: Endoscopy;;  colon  . BOWEL RESECTION N/A 03/23/2016   Procedure: LOW ANTERIOR RESECTION;  Surgeon: Aviva Signs, MD;  Location: [facility] ORS;  Service: General;  Laterality: N/A;  . COLONOSCOPY N/A 03/20/2016   Procedure: COLONOSCOPY;  Surgeon: Rogene Houston, MD;  Location: [facility];  Service: Endoscopy;  Laterality: N/A;  730  . COLONOSCOPY N/A 07/10/2016   Procedure: COLONOSCOPY;  Surgeon: Rogene Houston, MD;  Location: [facility];  Service: Endoscopy;  Laterality: N/A;  1040  . NO PAST SURGERIES    . POLYPECTOMY  07/10/2016   Procedure: POLYPECTOMY;  Surgeon: Rogene Houston, MD;  Location[facility];  Service: Endoscopy;;  colon  . PORTACATH PLACEMENT Left 08/05/2018   Procedure: INSERTION PORT-A-CATH;  Surgeon: Aviva Signs, MD;  Location: [facility] ORS;  Service: General;  Laterality: Left;  . TONSILLECTOMY       SOCIAL HISTORY:  Social History   Socioeconomic History  . Marital status: Married    Spouse name: Not on file  . Number of  children: Not on file  . Years of education: Not on file  . Highest education level: Not on file  Occupational History  . Not on file  Social Needs  . Financial resource strain: Not on file  . Food insecurity    Worry: Not on file    Inability: Not on file  . Transportation needs    Medical: Not on file    Non-medical: Not on file  Tobacco Use  . Smoking status: Never Smoker  . Smokeless tobacco: Never Used  Substance and Sexual Activity  . Alcohol use: Yes    Comment: rarely  . Drug use: No  . Sexual activity: Not on file  Lifestyle  . Physical activity    Days per week: Not on file    Minutes per session: Not on file  . Stress: Not on file  Relationships  . Social Herbalist on phone: Not on file    Gets together: Not on file    Attends religious service: Not on file    Active member of club or organization: Not on file    Attends meetings of clubs or organizations: Not on file    Relationship status: Not on file  . Intimate partner violence    Fear of current or ex partner: Not on file  Emotionally abused: Not on file    Physically abused: Not on file    Forced sexual activity: Not on file  Other Topics Concern  . Not on file  Social History Narrative  . Not on file    FAMILY HISTORY:  Family History  Problem Relation Age of Onset  . COPD Mother   . Melanoma Father     CURRENT MEDICATIONS:  Outpatient Encounter Medications as of 08/22/2018  Medication Sig  . amLODipine (NORVASC) 10 MG tablet Take 10 mg by mouth daily.  . Bevacizumab (AVASTIN IV) Inject into the vein every 14 (fourteen) days.  Marland Kitchen dextrose 5 % SOLN 1,000 mL with fluorouracil 5 GM/100ML SOLN Inject into the vein over 48 hr.  . FLUOROURACIL IV Inject into the vein every 14 (fourteen) days.  . hydrochlorothiazide (HYDRODIURIL) 25 MG tablet Take 25 mg by mouth daily.   Marland Kitchen HYDROcodone-acetaminophen (NORCO) 5-325 MG tablet Take 1 tablet by mouth every 4 (four) hours as needed for  moderate pain.  Marland Kitchen insulin glargine (LANTUS) 100 UNIT/ML injection Inject 30 Units into the skin at bedtime.   . IRINOTECAN HCL IV Inject into the vein every 14 (fourteen) days.  Marland Kitchen LEUCOVORIN CALCIUM IV Inject into the vein every 14 (fourteen) days.  Marland Kitchen lidocaine-prilocaine (EMLA) cream Apply a small amount to port a cath site and cover with plastic wrap 1 hour prior to chemotherapy appointments  . loperamide (IMODIUM A-D) 2 MG tablet Take 2 at onset of diarrhea, then 1 after each watery bowel movement.  . metoprolol succinate (TOPROL-XL) 50 MG 24 hr tablet Take 50 mg by mouth daily. Take with or immediately following a meal.  . OXALIPLATIN IV Inject into the vein every 14 (fourteen) days.  . prochlorperazine (COMPAZINE) 10 MG tablet Take 1 tablet (10 mg total) by mouth every 6 (six) hours as needed (Nausea or vomiting).   No facility-administered encounter medications on file as of 08/22/2018.     ALLERGIES:  Allergies  Allergen Reactions  . Penicillin G Nausea And Vomiting     PHYSICAL EXAM:  ECOG Performance status: 0  There were no vitals filed for this visit. There were no vitals filed for this visit. I have reviewed his vitals.  Blood pressure is 119/82, heart rate 88, respiratory rate 20, temperature 97.3.  Saturations are 99%.  Physical Exam Vitals signs reviewed.  Constitutional:      Appearance: Normal appearance.  Cardiovascular:     Rate and Rhythm: Normal rate and regular rhythm.     Heart sounds: Normal heart sounds.  Pulmonary:     Effort: Pulmonary effort is normal.     Breath sounds: Normal breath sounds.  Abdominal:     General: There is no distension.     Palpations: Abdomen is soft. There is no mass.  Musculoskeletal:        General: No swelling.  Skin:    General: Skin is warm.  Neurological:     General: No focal deficit present.     Mental Status: He is alert and oriented to person, place, and time.  Psychiatric:        Mood and Affect: Mood normal.         Behavior: Behavior normal.      LABORATORY DATA:  I have reviewed the labs as listed.  CBC    Component Value Date/Time   WBC 5.8 08/22/2018 1459   RBC 4.51 08/22/2018 1459   HGB 13.0 08/22/2018 1459   HCT 37.8 (L)  08/22/2018 1459   PLT 185 08/22/2018 1459   MCV 83.8 08/22/2018 1459   MCH 28.8 08/22/2018 1459   MCHC 34.4 08/22/2018 1459   RDW 13.1 08/22/2018 1459   LYMPHSABS 1.1 08/22/2018 1459   MONOABS 0.3 08/22/2018 1459   EOSABS 0.0 08/22/2018 1459   BASOSABS 0.0 08/22/2018 1459   CMP Latest Ref Rng & Units 08/22/2018 08/16/2018 07/19/2018  Glucose 70 - 99 mg/dL 217(H) 159(H) 193(H)  BUN 8 - 23 mg/dL 32(H) 23 26(H)  Creatinine 0.61 - 1.24 mg/dL 1.85(H) 1.30(H) 1.47(H)  Sodium 135 - 145 mmol/L 131(L) 141 141  Potassium 3.5 - 5.1 mmol/L 4.0 3.1(L) 3.8  Chloride 98 - 111 mmol/L 93(L) 101 103  CO2 22 - 32 mmol/L 25 28 26   Calcium 8.9 - 10.3 mg/dL 9.4 9.6 10.2  Total Protein 6.5 - 8.1 g/dL 7.0 6.9 7.2  Total Bilirubin 0.3 - 1.2 mg/dL 1.1 0.9 1.1  Alkaline Phos 38 - 126 U/L 83 56 50  AST 15 - 41 U/L 19 16 16   ALT 0 - 44 U/L 24 16 17        DIAGNOSTIC IMAGING:  I have independently reviewed the scans and discussed with the patient.   I have reviewed Venita Lick LPN's note and agree with the documentation.  I personally performed a face-to-face visit, made revisions and my assessment and plan is as follows.    ASSESSMENT & PLAN:   Adenocarcinoma of sigmoid colon ([place]) 1.  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 - He did not experience any vomiting or diarrhea.  No cold sensitivity reported.  No tingling or numbness in extremities.  Denied any bleeding per rectum. -However he started developing severe nausea since yesterday.  Compazine did not work. - We have done blood work today which showed renal insufficiency with creatinine of 1.85.  BUN is elevated at 32. - I have reviewed his medications.  We  will discontinue hydrochlorothiazide. - We have given 1 L of fluid with electrolytes today.  He also received Zofran 8 mg IV for intractable nausea. - After completion of fluid, his nausea has improved. - I will schedule him for fluid tomorrow.  We will plan to repeat his BMP tomorrow prior to fluids.  2.  Intractable nausea: -He was using Compazine 10 mg every 6 hours at home which did not help. -He received Zofran 8 mg IV which helped with nausea.  He did not have any vomiting. -We have called in Zofran 8 mg ODT to be taken every 8 hours as needed. - If there is no improvement with Zofran, will consider adding scopolamine patch.   Total time spent is 25 minutes with more than 50% of the time spent face-to-face discussing treatment plan, counseling and coordination of care.  Orders placed this encounter:  Orders Placed This Encounter  Procedures  . CBC with Differential/Platelet  . Comprehensive metabolic panel  . Magnesium      Derek Jack, MD [place] [phone]

## 2018-08-22 NOTE — Assessment & Plan Note (Signed)
1.  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 - He did not experience any vomiting or diarrhea.  No cold sensitivity reported.  No tingling or numbness in extremities.  Denied any bleeding per rectum. -However he started developing severe nausea since yesterday.  Compazine did not work. - We have done blood work today which showed renal insufficiency with creatinine of 1.85.  BUN is elevated at 32. - I have reviewed his medications.  We will discontinue hydrochlorothiazide. - We have given 1 L of fluid with electrolytes today.  He also received Zofran 8 mg IV for intractable nausea. - After completion of fluid, his nausea has improved. - I will schedule him for fluid tomorrow.  We will plan to repeat his BMP tomorrow prior to fluids.  2.  Intractable nausea: -He was using Compazine 10 mg every 6 hours at home which did not help. -He received Zofran 8 mg IV which helped with nausea.  He did not have any vomiting. -We have called in Zofran 8 mg ODT to be taken every 8 hours as needed. - If there is no improvement with Zofran, will consider adding scopolamine patch.

## 2018-08-22 NOTE — Progress Notes (Signed)
Patient tolerated hydration with no complaints voiced.  Port site redressed with antimicrobial disc with good blood return noted.  No bruising or swelling noted at site.  VSs with discharge and left ambulatory with no s/s of distress noted.

## 2018-08-22 NOTE — Telephone Encounter (Signed)
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  He has been taking the compazine with no relief.  Per Dr Delton Coombes, Luiz Iron called the pt's wife back and he is being added to Randi's schedule for today.

## 2018-08-23 ENCOUNTER — Inpatient Hospital Stay (HOSPITAL_COMMUNITY): Payer: 59

## 2018-08-23 DIAGNOSIS — Z5112 Encounter for antineoplastic immunotherapy: Secondary | ICD-10-CM | POA: Diagnosis not present

## 2018-08-23 DIAGNOSIS — R110 Nausea: Secondary | ICD-10-CM

## 2018-08-23 DIAGNOSIS — C187 Malignant neoplasm of sigmoid colon: Secondary | ICD-10-CM

## 2018-08-23 LAB — BASIC METABOLIC PANEL
Anion gap: 10 (ref 5–15)
BUN: 32 mg/dL — ABNORMAL HIGH (ref 8–23)
CO2: 25 mmol/L (ref 22–32)
Calcium: 9.2 mg/dL (ref 8.9–10.3)
Chloride: 96 mmol/L — ABNORMAL LOW (ref 98–111)
Creatinine, Ser: 1.94 mg/dL — ABNORMAL HIGH (ref 0.61–1.24)
GFR calc Af Amer: 42 mL/min — ABNORMAL LOW (ref >60)
GFR calc non Af Amer: 36 mL/min — ABNORMAL LOW (ref >60)
Glucose, Bld: 137 mg/dL — ABNORMAL HIGH (ref 70–99)
Potassium: 3.3 mmol/L — ABNORMAL LOW (ref 3.5–5.1)
Sodium: 131 mmol/L — ABNORMAL LOW (ref 135–145)

## 2018-08-23 MED ORDER — SODIUM CHLORIDE 0.9 % IV SOLN
INTRAVENOUS | Status: DC
  Administered 2018-08-23: 15:00:00 via INTRAVENOUS

## 2018-08-23 MED ORDER — SODIUM CHLORIDE 0.9% FLUSH
10.0000 mL | Freq: Once | INTRAVENOUS | Status: AC
  Administered 2018-08-23: 15:00:00 10 mL via INTRAVENOUS

## 2018-08-23 MED ORDER — HEPARIN SOD (PORK) LOCK FLUSH 100 UNIT/ML IV SOLN
500.0000 [IU] | Freq: Once | INTRAVENOUS | Status: AC
  Administered 2018-08-23: 16:00:00 500 [IU] via INTRAVENOUS

## 2018-08-23 NOTE — Progress Notes (Signed)
Spoke with Dr. Delton Coombes pertaining to lab results creat 1.94 today. VSS. Per MD, " patient to return tomorrow for IV fluids." Spoke with scheduling and appt made for 2pm tomorrow by ANance.   IV fluids given today per MD orders. Tolerated infusion without adverse affects. Vital signs stable. No complaints at this time. Discharged from clinic ambulatory. F/U with [facility] as scheduled.

## 2018-08-23 NOTE — Progress Notes (Signed)
To treatment area for hydration and labs.  Patient stated he feels better but prn nausea.  Using nausea medication as directed.  Dr. Delton Coombes made aware the patient is here for hydration.

## 2018-08-23 NOTE — Patient Instructions (Signed)
[facility] at [facility]  Discharge Instructions:   _______________________________________________________________  Thank you for choosing [facility] at [facility] to provide your oncology and hematology care.  To afford each patient quality time with our providers, please arrive at least 15 minutes before your scheduled appointment.  You need to re-schedule your appointment if you arrive 10 or more minutes late.  We strive to give you quality time with our providers, and arriving late affects you and other patients whose appointments are after yours.  Also, if you no show three or more times for appointments you may be dismissed from the clinic.  Again, thank you for choosing [facility] at [facility]. Our hope is that these requests will allow you access to exceptional care and in a timely manner. _______________________________________________________________  If you have questions after your visit, please contact our office at [phone] between the hours of 8:30 a.m. and 5:00 p.m. Voicemails left after 4:30 p.m. will not be returned until the following business day. _______________________________________________________________  For prescription refill requests, have your pharmacy contact our office. _______________________________________________________________  Recommendations made by the consultant and any test results will be sent to your referring physician. _______________________________________________________________ 

## 2018-08-24 ENCOUNTER — Inpatient Hospital Stay (HOSPITAL_COMMUNITY): Payer: 59

## 2018-08-24 ENCOUNTER — Other Ambulatory Visit: Payer: Self-pay

## 2018-08-24 VITALS — BP 125/83 | HR 77 | Temp 96.8°F | Resp 16

## 2018-08-24 DIAGNOSIS — C187 Malignant neoplasm of sigmoid colon: Secondary | ICD-10-CM

## 2018-08-24 DIAGNOSIS — Z5112 Encounter for antineoplastic immunotherapy: Secondary | ICD-10-CM | POA: Diagnosis not present

## 2018-08-24 MED ORDER — ONDANSETRON HCL 4 MG/2ML IJ SOLN
8.0000 mg | Freq: Once | INTRAMUSCULAR | Status: AC
  Administered 2018-08-24: 8 mg via INTRAVENOUS
  Filled 2018-08-24: qty 4

## 2018-08-24 MED ORDER — SODIUM CHLORIDE 0.9 % IV SOLN
INTRAVENOUS | Status: DC
  Administered 2018-08-24: 14:00:00 via INTRAVENOUS

## 2018-08-24 MED ORDER — ONDANSETRON HCL 4 MG/2ML IJ SOLN
INTRAMUSCULAR | Status: AC
  Filled 2018-08-24: qty 2

## 2018-08-24 MED ORDER — HEPARIN SOD (PORK) LOCK FLUSH 100 UNIT/ML IV SOLN
500.0000 [IU] | Freq: Once | INTRAVENOUS | Status: AC
  Administered 2018-08-24: 500 [IU] via INTRAVENOUS

## 2018-08-24 MED ORDER — SODIUM CHLORIDE 0.9% FLUSH
10.0000 mL | Freq: Once | INTRAVENOUS | Status: AC
  Administered 2018-08-24: 10 mL via INTRAVENOUS

## 2018-08-24 NOTE — Patient Instructions (Signed)
[facility] at [facility]  Discharge Instructions:   _______________________________________________________________  Thank you for choosing [facility] at [facility] to provide your oncology and hematology care.  To afford each patient quality time with our providers, please arrive at least 15 minutes before your scheduled appointment.  You need to re-schedule your appointment if you arrive 10 or more minutes late.  We strive to give you quality time with our providers, and arriving late affects you and other patients whose appointments are after yours.  Also, if you no show three or more times for appointments you may be dismissed from the clinic.  Again, thank you for choosing [facility] at [facility]. Our hope is that these requests will allow you access to exceptional care and in a timely manner. _______________________________________________________________  If you have questions after your visit, please contact our office at [phone] between the hours of 8:30 a.m. and 5:00 p.m. Voicemails left after 4:30 p.m. will not be returned until the following business day. _______________________________________________________________  For prescription refill requests, have your pharmacy contact our office. _______________________________________________________________  Recommendations made by the consultant and any test results will be sent to your referring physician. _______________________________________________________________ 

## 2018-08-24 NOTE — Progress Notes (Signed)
Patient here today for fluids, stated he did have a small amount of nausea today, no vomiting, eating a little, drinking 2-3 ensures a day.

## 2018-08-26 ENCOUNTER — Other Ambulatory Visit: Payer: Self-pay

## 2018-08-26 ENCOUNTER — Inpatient Hospital Stay (HOSPITAL_COMMUNITY): Payer: 59

## 2018-08-26 VITALS — BP 139/78 | HR 84 | Temp 97.5°F | Resp 18

## 2018-08-26 DIAGNOSIS — E876 Hypokalemia: Secondary | ICD-10-CM

## 2018-08-26 DIAGNOSIS — C187 Malignant neoplasm of sigmoid colon: Secondary | ICD-10-CM

## 2018-08-26 DIAGNOSIS — Z5112 Encounter for antineoplastic immunotherapy: Secondary | ICD-10-CM | POA: Diagnosis not present

## 2018-08-26 LAB — COMPREHENSIVE METABOLIC PANEL
ALT: 17 U/L (ref 0–44)
AST: 13 U/L — ABNORMAL LOW (ref 15–41)
Albumin: 3.2 g/dL — ABNORMAL LOW (ref 3.5–5.0)
Alkaline Phosphatase: 64 U/L (ref 38–126)
Anion gap: 10 (ref 5–15)
BUN: 21 mg/dL (ref 8–23)
CO2: 27 mmol/L (ref 22–32)
Calcium: 9.3 mg/dL (ref 8.9–10.3)
Chloride: 96 mmol/L — ABNORMAL LOW (ref 98–111)
Creatinine, Ser: 1.48 mg/dL — ABNORMAL HIGH (ref 0.61–1.24)
GFR calc Af Amer: 58 mL/min — ABNORMAL LOW (ref >60)
GFR calc non Af Amer: 50 mL/min — ABNORMAL LOW (ref >60)
Glucose, Bld: 144 mg/dL — ABNORMAL HIGH (ref 70–99)
Potassium: 3.1 mmol/L — ABNORMAL LOW (ref 3.5–5.1)
Sodium: 133 mmol/L — ABNORMAL LOW (ref 135–145)
Total Bilirubin: 0.8 mg/dL (ref 0.3–1.2)
Total Protein: 6.4 g/dL — ABNORMAL LOW (ref 6.5–8.1)

## 2018-08-26 MED ORDER — SODIUM CHLORIDE 0.9 % IV SOLN
8.0000 mg | Freq: Once | INTRAVENOUS | Status: AC
  Administered 2018-08-26: 12:00:00 8 mg via INTRAVENOUS
  Filled 2018-08-26: qty 4

## 2018-08-26 MED ORDER — SODIUM CHLORIDE 0.9 % IV SOLN
Freq: Once | INTRAVENOUS | Status: AC
  Administered 2018-08-26: 12:00:00 via INTRAVENOUS
  Filled 2018-08-26: qty 1000

## 2018-08-26 MED ORDER — DIPHENOXYLATE-ATROPINE 2.5-0.025 MG PO TABS: 1.0000 | ORAL_TABLET | Freq: Four times a day (QID) | ORAL | 0 refills | Status: DC | PRN

## 2018-08-26 MED ORDER — POTASSIUM CHLORIDE CRYS ER 20 MEQ PO TBCR
40.0000 meq | EXTENDED_RELEASE_TABLET | Freq: Once | ORAL | Status: AC
  Administered 2018-08-26: 14:00:00 40 meq via ORAL
  Filled 2018-08-26: qty 2

## 2018-08-26 MED ORDER — ONDANSETRON HCL 4 MG/2ML IJ SOLN: 8.0000 mg | Freq: Once | INTRAMUSCULAR | Status: DC

## 2018-08-26 MED ORDER — ONDANSETRON HCL 4 MG/2ML IJ SOLN
INTRAMUSCULAR | Status: AC
  Filled 2018-08-26: qty 4

## 2018-08-26 MED ORDER — SODIUM CHLORIDE 0.9% FLUSH
10.0000 mL | Freq: Once | INTRAVENOUS | Status: AC
  Administered 2018-08-26: 10 mL

## 2018-08-26 MED ORDER — HEPARIN SOD (PORK) LOCK FLUSH 100 UNIT/ML IV SOLN
500.0000 [IU] | Freq: Once | INTRAVENOUS | Status: AC
  Administered 2018-08-26: 15:00:00 500 [IU] via INTRAVENOUS

## 2018-08-26 NOTE — Progress Notes (Signed)
Received telephone order from K. Nester, NP for 1000 mL of 0.9% sodium chloride + 20 mEq potassium chloride + 2 grams magnesium sulfate given once at 500 mL/hr. Orders entered per request.   Thank you for allowing pharmacy to be a part of this patient's care.  Leron Croak, PharmD PGY2 Oncology/Hematology Pharmacy Resident 08/26/2018 12:01 PM

## 2018-08-26 NOTE — Progress Notes (Signed)
Pt presents today for lab draw, IV hydration, Zofran 8mg  IVPB, 57meq of k+. VSS. Pt seen by RNester NP. MAR reviewed. Pt has no complaints of vomiting or nausea at this time.    IV hydration given today per MD orders. Tolerated infusion without adverse affects. Vital signs stable. No complaints at this time. Discharged from clinic ambulatory. F/U with [facility] as scheduled.

## 2018-08-26 NOTE — Patient Instructions (Signed)
[facility] at [facility]  Discharge Instructions:   _______________________________________________________________  Thank you for choosing [facility] at [facility] to provide your oncology and hematology care.  To afford each patient quality time with our providers, please arrive at least 15 minutes before your scheduled appointment.  You need to re-schedule your appointment if you arrive 10 or more minutes late.  We strive to give you quality time with our providers, and arriving late affects you and other patients whose appointments are after yours.  Also, if you no show three or more times for appointments you may be dismissed from the clinic.  Again, thank you for choosing [facility] at [facility]. Our hope is that these requests will allow you access to exceptional care and in a timely manner. _______________________________________________________________  If you have questions after your visit, please contact our office at [phone] between the hours of 8:30 a.m. and 5:00 p.m. Voicemails left after 4:30 p.m. will not be returned until the following business day. _______________________________________________________________  For prescription refill requests, have your pharmacy contact our office. _______________________________________________________________  Recommendations made by the consultant and any test results will be sent to your referring physician. _______________________________________________________________ 

## 2018-08-29 ENCOUNTER — Inpatient Hospital Stay (HOSPITAL_COMMUNITY): Payer: 59 | Admitting: Nurse Practitioner

## 2018-08-29 ENCOUNTER — Inpatient Hospital Stay (HOSPITAL_COMMUNITY): Payer: 59

## 2018-08-29 ENCOUNTER — Other Ambulatory Visit (HOSPITAL_COMMUNITY): Payer: Self-pay | Admitting: Nurse Practitioner

## 2018-08-29 ENCOUNTER — Other Ambulatory Visit: Payer: Self-pay

## 2018-08-29 ENCOUNTER — Encounter (HOSPITAL_COMMUNITY): Payer: Self-pay | Admitting: *Deleted

## 2018-08-29 VITALS — BP 111/82 | HR 94 | Temp 98.4°F | Resp 18 | Wt 203.0 lb

## 2018-08-29 DIAGNOSIS — I10 Essential (primary) hypertension: Secondary | ICD-10-CM

## 2018-08-29 DIAGNOSIS — E119 Type 2 diabetes mellitus without complications: Secondary | ICD-10-CM

## 2018-08-29 DIAGNOSIS — C187 Malignant neoplasm of sigmoid colon: Secondary | ICD-10-CM

## 2018-08-29 DIAGNOSIS — C787 Secondary malignant neoplasm of liver and intrahepatic bile duct: Secondary | ICD-10-CM | POA: Diagnosis not present

## 2018-08-29 DIAGNOSIS — E876 Hypokalemia: Secondary | ICD-10-CM

## 2018-08-29 DIAGNOSIS — R110 Nausea: Secondary | ICD-10-CM | POA: Diagnosis not present

## 2018-08-29 DIAGNOSIS — R634 Abnormal weight loss: Secondary | ICD-10-CM

## 2018-08-29 DIAGNOSIS — Z5112 Encounter for antineoplastic immunotherapy: Secondary | ICD-10-CM | POA: Diagnosis not present

## 2018-08-29 LAB — CBC WITH DIFFERENTIAL/PLATELET
Abs Immature Granulocytes: 0.06 10*3/uL (ref 0.00–0.07)
Basophils Absolute: 0 10*3/uL (ref 0.0–0.1)
Basophils Relative: 0 %
Eosinophils Absolute: 0.1 10*3/uL (ref 0.0–0.5)
Eosinophils Relative: 2 %
HCT: 36.2 % — ABNORMAL LOW (ref 39.0–52.0)
Hemoglobin: 12.6 g/dL — ABNORMAL LOW (ref 13.0–17.0)
Immature Granulocytes: 1 %
Lymphocytes Relative: 20 %
Lymphs Abs: 1.2 10*3/uL (ref 0.7–4.0)
MCH: 28.8 pg (ref 26.0–34.0)
MCHC: 34.8 g/dL (ref 30.0–36.0)
MCV: 82.8 fL (ref 80.0–100.0)
Monocytes Absolute: 0.7 10*3/uL (ref 0.1–1.0)
Monocytes Relative: 11 %
Neutro Abs: 3.9 10*3/uL (ref 1.7–7.7)
Neutrophils Relative %: 66 %
Platelets: 263 10*3/uL (ref 150–400)
RBC: 4.37 MIL/uL (ref 4.22–5.81)
RDW: 13.6 % (ref 11.5–15.5)
WBC: 6 10*3/uL (ref 4.0–10.5)
nRBC: 0 % (ref 0.0–0.2)

## 2018-08-29 LAB — COMPREHENSIVE METABOLIC PANEL
ALT: 21 U/L (ref 0–44)
AST: 18 U/L (ref 15–41)
Albumin: 3 g/dL — ABNORMAL LOW (ref 3.5–5.0)
Alkaline Phosphatase: 74 U/L (ref 38–126)
Anion gap: 12 (ref 5–15)
BUN: 17 mg/dL (ref 8–23)
CO2: 23 mmol/L (ref 22–32)
Calcium: 8.9 mg/dL (ref 8.9–10.3)
Chloride: 97 mmol/L — ABNORMAL LOW (ref 98–111)
Creatinine, Ser: 2.15 mg/dL — ABNORMAL HIGH (ref 0.61–1.24)
GFR calc Af Amer: 37 mL/min — ABNORMAL LOW (ref >60)
GFR calc non Af Amer: 32 mL/min — ABNORMAL LOW (ref >60)
Glucose, Bld: 179 mg/dL — ABNORMAL HIGH (ref 70–99)
Potassium: 3.4 mmol/L — ABNORMAL LOW (ref 3.5–5.1)
Sodium: 132 mmol/L — ABNORMAL LOW (ref 135–145)
Total Bilirubin: 0.7 mg/dL (ref 0.3–1.2)
Total Protein: 6.7 g/dL (ref 6.5–8.1)

## 2018-08-29 LAB — MAGNESIUM: Magnesium: 1.7 mg/dL (ref 1.7–2.4)

## 2018-08-29 MED ORDER — SODIUM CHLORIDE 0.9 % IV SOLN
Freq: Once | INTRAVENOUS | Status: AC
  Administered 2018-08-29: 14:00:00 via INTRAVENOUS

## 2018-08-29 MED ORDER — ONDANSETRON HCL 4 MG/2ML IJ SOLN
INTRAMUSCULAR | Status: AC
  Filled 2018-08-29: qty 2

## 2018-08-29 MED ORDER — ONDANSETRON HCL 4 MG/2ML IJ SOLN
4.0000 mg | Freq: Once | INTRAMUSCULAR | Status: AC
  Administered 2018-08-29: 4 mg via INTRAVENOUS

## 2018-08-29 MED ORDER — SCOPOLAMINE 1 MG/3DAYS TD PT72: 1.0000 | MEDICATED_PATCH | TRANSDERMAL | 12 refills | Status: DC

## 2018-08-29 MED ORDER — SODIUM CHLORIDE 0.9 % IV SOLN: INTRAVENOUS | Status: DC

## 2018-08-29 MED ORDER — SODIUM CHLORIDE 0.9 % IV SOLN
Freq: Once | INTRAVENOUS | Status: AC
  Administered 2018-08-29: 15:00:00 via INTRAVENOUS
  Filled 2018-08-29: qty 10

## 2018-08-29 MED ORDER — HEPARIN SOD (PORK) LOCK FLUSH 100 UNIT/ML IV SOLN
500.0000 [IU] | Freq: Once | INTRAVENOUS | Status: AC
  Administered 2018-08-29: 500 [IU] via INTRAVENOUS

## 2018-08-29 MED ORDER — SCOPOLAMINE 1 MG/3DAYS TD PT72
1.0000 | MEDICATED_PATCH | Freq: Once | TRANSDERMAL | Status: DC
  Administered 2018-08-29: 1.5 mg via TRANSDERMAL
  Filled 2018-08-29: qty 1

## 2018-08-29 NOTE — Progress Notes (Signed)
1330 patient presents to clinic for IV hydration after reporting increased nausea, vomiting, and diarrhea over the weekend. Pt states he has no appetite and gets nauseous every time he tries to eat. Patient has vomited several times over the weekend. He states he ate ham and biscuits this morning for breakfast and was able to keep that down. He reports that he is drinking gatorade to stay hydrated. He also reports that he is continuing to have diarrhea at least 2 times a day and is taking his Lomotil twice daily. Francene Finders, NP in to assess patient. He was told he is able to take his Lomotil up to four times a day as needed. He will be given 1L NS with Mag and K+ while he is here today. NP also ordered an additional 500cc NS since his creatinine has elevated to 2.15. 4mg  Zofran ordered for nausea and a Scopalamine patch will be placed prior to discharge. Pt to return to clinic tomorrow for additional hydration and the following day he will have repeat lab work and will see Dr. Delton Coombes. Pt agreeable and understanding of his plan of care at this time.   Grace Bushy Julio tolerated IV hydration and Zofran without incident or complaint. Scopolamine patch applied. Port flushed and left accessed for use tomorrow. Discharged self ambulatory in satisfactory condition.

## 2018-08-29 NOTE — Patient Instructions (Signed)
[place] at [facility] Discharge Instructions  Follow-up tomorrow with IV fluids.   Thank you for choosing [place] at [facility] to provide your oncology and hematology care.  To afford each patient quality time with our provider, please arrive at least 15 minutes before your scheduled appointment time.   If you have a lab appointment with the [facility] please come in thru the  Main Entrance and check in at the main information desk  You need to re-schedule your appointment should you arrive 10 or more minutes late.  We strive to give you quality time with our providers, and arriving late affects you and other patients whose appointments are after yours.  Also, if you no show three or more times for appointments you may be dismissed from the clinic at the providers discretion.     Again, thank you for choosing [facility].  Our hope is that these requests will decrease the amount of time that you wait before being seen by our physicians.       _____________________________________________________________  Should you have questions after your visit to [facility], please contact our office at [phone] between the hours of 8:00 a.m. and 4:30 p.m.  Voicemails left after 4:00 p.m. will not be returned until the following business day.  For prescription refill requests, have your pharmacy contact our office and allow 72 hours.    Cancer Center Support Programs:   > Cancer Support Group  2nd Tuesday of the month 1pm-2pm, Journey Room

## 2018-08-29 NOTE — Progress Notes (Signed)
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 Focal narrowing and wall thickening of the sigmoid colon is noted in this area most likely due to inflammation, but neoplasm cannot be excluded, and sigmoidoscopy is recommended for further evaluation.   03/20/2016 Procedure   Colonoscopy by Dr. Laural Golden- Malignant partially obstructing tumor in the distal sigmoid colon. Biopsied. - Incomplete exam.   03/23/2016 Pathology Results   Colon, biopsy, sigmoid mass - ADENOCARCINOMA.   03/23/2016 Procedure   Low anterior resection with transanal anastomosis   03/26/2016 Pathology Results   Colon, segmental resection for tumor, sigmoid - INVASIVE WELL DIFFERENTIATED ADENOCARCINOMA, SPANNING 5 CM IN GREATEST DIMENSION. - TUMOR INVADES THROUGH MUSCULARIS PROPRIA TO INVOLVE SUBSEROSAL SOFT TISSUES. - UNDERLYING MARKED ACUTE AND CHRONIC INFLAMMATION WITH GIANT CELL REACTION AND ABSCESS FORMATION EXTENDING TO THE SEROSAL SURFACE AND INVOLVING THE MESENTERIC TISSUE. - MARGINS ARE NEGATIVE FOR TUMOR. - FOURTEEN BENIGN LYMPH NODES WHICH DEMONSTRATE VARIABLE INVOLVEMENT WITH ACUTE AND CHRONIC INFLAMMATION WITH ASSOCIATED GIANT CELLS WITHOUT TUMOR IDENTIFIED (0/14).   04/08/2016 Cancer Staging   Cancer Staging Adenocarcinoma of sigmoid colon [facility]) Staging form: Colon and Rectum, AJCC 8th Edition - Pathologic stage from 04/01/2016: Stage IIA (pT3, pN0, cM0) - Signed by Baird Cancer, PA-C on 04/01/2016    07/09/2016 Imaging   CT abd/pelvis- No evidence of metastatic disease or  other acute findings.  Bilateral nonobstructing renal calculi and mild bilateral renal parenchymal scarring.  Stable mildly enlarged prostate gland and findings of chronic bladder outlet obstruction.   08/16/2018 -  Chemotherapy   The patient had palonosetron (ALOXI) injection 0.25 mg, 0.25 mg, Intravenous,  Once, 1 of 6 cycles Administration: 0.25 mg (08/16/2018) pegfilgrastim (NEULASTA) injection 6 mg, 6 mg, Subcutaneous, Once, 1 of 6 cycles Administration: 6 mg (08/18/2018) irinotecan (CAMPTOSAR) 380 mg in sodium chloride 0.9 % 500 mL chemo infusion, 165 mg/m2 = 380 mg, Intravenous,  Once, 1 of 6 cycles Administration: 380 mg (08/16/2018) leucovorin 900 mg in dextrose 5 % 250 mL infusion, 400 mg/m2 = 900 mg, Intravenous,  Once, 1 of 6 cycles Administration: 900 mg (08/16/2018) oxaliplatin (ELOXATIN) 190 mg in dextrose 5 % 500 mL chemo infusion, 85 mg/m2 = 190 mg, Intravenous,  Once, 1 of 6 cycles Administration: 190 mg (08/16/2018) fosaprepitant (EMEND) 150 mg, dexamethasone (DECADRON) 12 mg in sodium chloride 0.9 % 145 mL IVPB, , Intravenous,  Once, 1 of 6 cycles Administration:  (08/16/2018) fluorouracil (ADRUCIL) 5,400 mg in sodium chloride 0.9 % 142 mL chemo infusion, 2,400 mg/m2 = 5,400 mg, Intravenous, 1 Day/Dose, 1 of 6 cycles Administration: 5,400 mg (08/16/2018) bevacizumab-awwb (MVASI) 500 mg in sodium chloride 0.9 % 100 mL chemo infusion, 5 mg/kg = 500 mg (100 % of original dose 5 mg/kg), Intravenous,  Once, 1 of 6 cycles Dose modification: 5 mg/kg (original dose 5 mg/kg, Cycle 1) Administration: 500 mg (08/16/2018)  for chemotherapy treatment.       CANCER STAGING: Cancer Staging Adenocarcinoma of sigmoid colon [facility]) Staging form: Colon and Rectum, AJCC 8th Edition - Pathologic stage from 04/01/2016: Stage IIA (pT3, pN0, cM0) - Signed by Robynn Pane  S, PA-C on 04/01/2016    INTERVAL HISTORY:  [name] 63 y.o. male returns for routine follow-up for sigmoid colon cancer.  He  reports he is feeling very fatigued and nauseous during the day.  He is not eating well or drinking very much.  He reports he is also having diarrhea daily.  He is only taken 1-2 Lomotil a day.  He was educated to increase that amount to 4 if needed. Denies any new pains. Had not noticed any recent bleeding such as epistaxis, hematuria or hematochezia. Denies recent chest pain on exertion, shortness of breath on minimal exertion, pre-syncopal episodes, or palpitations. Denies any numbness or tingling in hands or feet. Denies any recent fevers, infections, or recent hospitalizations. Patient reports appetite at 0% and energy level at 25%.  He has lost 20 pounds since August 10, 2018.     REVIEW OF SYSTEMS:  Review of Systems  Constitutional: Positive for appetite change and fatigue.  Gastrointestinal: Positive for diarrhea and nausea.  All other systems reviewed and are negative.    PAST MEDICAL/SURGICAL HISTORY:  Past Medical History:  Diagnosis Date  . 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 Carpal tunnel syndrome 05/02/2014   Bilateral  . Cervical disc disorder with radiculopathy of cervical region 05/02/2014   Left C8  . Diabetes mellitus without complication ([place])   . Hypertension   . Port-A-Cath in place 08/10/2018   Past Surgical History:  Procedure Laterality Date  . BIOPSY  03/20/2016   Procedure: BIOPSY;  Surgeon: Rogene Houston, MD;  [facility];  Service: Endoscopy;;  colon  . BOWEL RESECTION N/A 03/23/2016   Procedure: LOW ANTERIOR RESECTION;  Surgeon: Aviva Signs, MD;  Location: [facility] ORS;  Service: General;  Laterality: N/A;  . COLONOSCOPY N/A 03/20/2016   Procedure: COLONOSCOPY;  Surgeon: Rogene Houston, MD;  Location[facility];  Service: Endoscopy;  Laterality: N/A;  730  . COLONOSCOPY N/A 07/10/2016   Procedure: COLONOSCOPY;  Surgeon: Rogene Houston, MD;  [facility];  Service: Endoscopy;  Laterality: N/A;  1040  . NO PAST SURGERIES     . POLYPECTOMY  07/10/2016   Procedure: POLYPECTOMY;  Surgeon: Rogene Houston, MD;  [facility];  Service: Endoscopy;;  colon  . PORTACATH PLACEMENT Left 08/05/2018   Procedure: INSERTION PORT-A-CATH;  Surgeon: Aviva Signs, MD;  Location: [facility] OR[facility];  Service: General;  Laterality: Left;  . TONSILLECTOMY       SOCIAL HISTORY:  Social History   Socioeconomic History  . Marital status: Married    Spouse name: Not on file  . Number of children: Not on file  . Years of education: Not on file  . Highest education level: Not on file  Occupational History  . Not on file  Social Needs  . Financial resource strain: Not on file  . Food insecurity    Worry: Not on file    Inability: Not on file  . Transportation needs    Medical: Not on file    Non-medical: Not on file  Tobacco Use  . Smoking status: Never Smoker  . Smokeless tobacco: Never Used  Substance and Sexual Activity  . Alcohol use: Yes    Comment: rarely  . Drug use: No  . Sexual activity: Not on file  Lifestyle  . Physical activity    Days per week: Not on file    Minutes per session: Not on file  . Stress: Not on  file  Relationships  . Social Herbalist on phone: Not on file    Gets together: Not on file    Attends religious service: Not on file    Active member of club or organization: Not on file    Attends meetings of clubs or organizations: Not on file    Relationship status: Not on file  . Intimate partner violence    Fear of current or ex partner: Not on file    Emotionally abused: Not on file    Physically abused: Not on file    Forced sexual activity: Not on file  Other Topics Concern  . Not on file  Social History Narrative  . Not on file    FAMILY HISTORY:  Family History  Problem Relation Age of Onset  . COPD Mother   . Melanoma Father     CURRENT MEDICATIONS:  Outpatient Encounter Medications as of 08/29/2018  Medication Sig  . amLODipine (NORVASC) 10 MG tablet Take  10 mg by mouth daily.  . Bevacizumab (AVASTIN IV) Inject into the vein every 14 (fourteen) days.  Marland Kitchen dextrose 5 % SOLN 1,000 mL with fluorouracil 5 GM/100ML SOLN Inject into the vein over 48 hr.  . diphenoxylate-atropine (LOMOTIL) 2.5-0.025 MG tablet Take 1 tablet by mouth 4 (four) times daily as needed for diarrhea or loose stools.  Marland Kitchen FLUOROURACIL IV Inject into the vein every 14 (fourteen) days.  . hydrochlorothiazide (HYDRODIURIL) 25 MG tablet Take 25 mg by mouth daily.   Marland Kitchen HYDROcodone-acetaminophen (NORCO) 5-325 MG tablet Take 1 tablet by mouth every 4 (four) hours as needed for moderate pain.  Marland Kitchen insulin glargine (LANTUS) 100 UNIT/ML injection Inject 30 Units into the skin at bedtime.   . IRINOTECAN HCL IV Inject into the vein every 14 (fourteen) days.  Marland Kitchen LEUCOVORIN CALCIUM IV Inject into the vein every 14 (fourteen) days.  Marland Kitchen lidocaine-prilocaine (EMLA) cream Apply a small amount to port a cath site and cover with plastic wrap 1 hour prior to chemotherapy appointments  . loperamide (IMODIUM A-D) 2 MG tablet Take 2 at onset of diarrhea, then 1 after each watery bowel movement.  . metoprolol succinate (TOPROL-XL) 50 MG 24 hr tablet Take 50 mg by mouth daily. Take with or immediately following a meal.  . ondansetron (ZOFRAN ODT) 8 MG disintegrating tablet Take 1 tablet (8 mg total) by mouth every 8 (eight) hours as needed for nausea or vomiting.  . OXALIPLATIN IV Inject into the vein every 14 (fourteen) days.  . prochlorperazine (COMPAZINE) 10 MG tablet Take 1 tablet (10 mg total) by mouth every 6 (six) hours as needed (Nausea or vomiting).   Facility-Administered Encounter Medications as of 08/29/2018  Medication Note  . [COMPLETED] ondansetron (ZOFRAN) injection 4 mg   . [DISCONTINUED] sodium chloride 0.9 % 1,000 mL with potassium chloride 20 mEq infusion 08/29/2018: entered into bag    ALLERGIES:  Allergies  Allergen Reactions  . Penicillin G Nausea And Vomiting     PHYSICAL EXAM:   ECOG Performance status: 1  Vital signs: -Blood pressure 111/82 -Heart rate 94 -Respirations 18 -Temperature 98.4  Physical Exam Constitutional:      Appearance: He is ill-appearing.  Cardiovascular:     Rate and Rhythm: Normal rate and regular rhythm.     Heart sounds: Normal heart sounds.  Pulmonary:     Effort: Pulmonary effort is normal.     Breath sounds: Normal breath sounds.  Abdominal:     General: Bowel  sounds are normal.     Palpations: Abdomen is soft.  Musculoskeletal: Normal range of motion.  Skin:    General: Skin is warm and dry.  Neurological:     Mental Status: He is alert and oriented to person, place, and time. Mental status is at baseline.  Psychiatric:        Mood and Affect: Mood normal.        Behavior: Behavior normal.        Thought Content: Thought content normal.        Judgment: Judgment normal.      LABORATORY DATA:  I have reviewed the labs as listed.  CBC    Component Value Date/Time   WBC 6.0 08/29/2018 1247   RBC 4.37 08/29/2018 1247   HGB 12.6 (L) 08/29/2018 1247   HCT 36.2 (L) 08/29/2018 1247   PLT 263 08/29/2018 1247   MCV 82.8 08/29/2018 1247   MCH 28.8 08/29/2018 1247   MCHC 34.8 08/29/2018 1247   RDW 13.6 08/29/2018 1247   LYMPHSABS 1.2 08/29/2018 1247   MONOABS 0.7 08/29/2018 1247   EOSABS 0.1 08/29/2018 1247   BASOSABS 0.0 08/29/2018 1247   CMP Latest Ref Rng & Units 08/29/2018 08/26/2018 08/23/2018  Glucose 70 - 99 mg/dL 179(H) 144(H) 137(H)  BUN 8 - 23 mg/dL 17 21 32(H)  Creatinine 0.61 - 1.24 mg/dL 2.15(H) 1.48(H) 1.94(H)  Sodium 135 - 145 mmol/L 132(L) 133(L) 131(L)  Potassium 3.5 - 5.1 mmol/L 3.4(L) 3.1(L) 3.3(L)  Chloride 98 - 111 mmol/L 97(L) 96(L) 96(L)  CO2 22 - 32 mmol/L 23 27 25   Calcium 8.9 - 10.3 mg/dL 8.9 9.3 9.2  Total Protein 6.5 - 8.1 g/dL 6.7 6.4(L) -  Total Bilirubin 0.3 - 1.2 mg/dL 0.7 0.8 -  Alkaline Phos 38 - 126 U/L 74 64 -  AST 15 - 41 U/L 18 13(L) -  ALT 0 - 44 U/L 21 17 -     I  personally performed a face-to-face visit.  All questions were answered to patient's stated satisfaction. Encouraged patient to call with any new concerns or questions before his next visit to the cancer center and we can certain see him sooner, if needed.     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 - He did not experience any vomiting or diarrhea.  No cold sensitivity reported.  No tingling or numbness in his extremities.  Denied any bleeding per rectum.  After his first cycle - He now reports diarrhea and severe nausea.  Compazine did not work.  We prescribed him Zofran which helps better.  We also prescribed him Lomotil.  He reports only taking 1-2 a day.  I have recommended that he can take up to 4 a day if he needs it. -Today on 08/29/2018 we will give him 4 mg of IV Zofran.  We will also place a scopolamine patch prior to him leaving today.  He reports he gets nauseous with meals.  He will let us know if this helps. -He reports he is not eating or drinking very much during the day.  I have recommended he drink at least 3 Ensure or boost daily if he is not eating. - Labs done on 08/29/2018 showed his creatinine had increased to 2.15.  We are giving him a liter and a half of IV fluids today. - Magnesium was 1.7 and potassium  was 3.4 we will also give him IV mag and potassium in his fluids. - The rest of his labs were within normal limits. - He will be placed on the schedule for more IV fluids tomorrow on 08/30/2018. -She will follow-up with Dr. Delton Coombes on Wednesday, 08/31/2018 for his second cycle of treatment.  2.  Intractable nausea: - He was first prescribed Compazine 10 mg to take every 6 hours at home which did not help. -He was then prescribed Zofran 8 mg ODT to be taken every 8 hours as needed.  He reports this helps a little.  But he is still getting nauseous with  meals. - I have prescribed him a scopolamine patch to place at the end of his visit today on 08/29/2018.  He will let us know if this helps and we can send him a prescription for home. -I will give him 4 mg of IV Zofran with his IV fluids today on 08/29/2018.  3.  Weight loss: - He has lost 20 pounds since 08/10/2018. -He reports he is unable to eat due to the nausea. - I am sending him home with a scopolamine patch.  I have also told him to drink 3-4 Ensure or boost high-calorie a day.      Orders placed this encounter:  No orders of the defined types were placed in this encounter.    Francene Finders, FNP-C [place] [phone]

## 2018-08-29 NOTE — Progress Notes (Signed)
Patient's wife Shirlean Mylar, called this morning. She had questions about side effects and timeline of him feeling better. She states that patient has been taking lomotil and it has not helped his diarrhea at all. She describes his stools as constant watery stools.  He is unable to eat much if anything. He has not felt like doing much, no energy.    I told her that we would assess at today's visit.  I explained to her that we can manage the side effects and our intentions are not to decrease his quality of life.  I explained to her that if he was getting dehydrated that could be the cause of him not feeling well and not having any energy or appetite.  We will assess and treat as necessary today. She verbalizes understanding.

## 2018-08-29 NOTE — Assessment & Plan Note (Addendum)
1.  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 - He did not experience any vomiting or diarrhea.  No cold sensitivity reported.  No tingling or numbness in his extremities.  Denied any bleeding per rectum.  After his first cycle - He now reports diarrhea and severe nausea.  Compazine did not work.  We prescribed him Zofran which helps better.  We also prescribed him Lomotil.  He reports only taking 1-2 a day.  I have recommended that he can take up to 4 a day if he needs it. -Today on 08/29/2018 we will give him 4 mg of IV Zofran.  We will also place a scopolamine patch prior to him leaving today.  He reports he gets nauseous with meals.  He will let us know if this helps. -He reports he is not eating or drinking very much during the day.  I have recommended he drink at least 3 Ensure or boost daily if he is not eating. - Labs done on 08/29/2018 showed his creatinine had increased to 2.15.  We are giving him a liter and a half of IV fluids today. - Magnesium was 1.7 and potassium was 3.4 we will also give him IV mag and potassium in his fluids. - The rest of his labs were within normal limits. - He will be placed on the schedule for more IV fluids tomorrow on 08/30/2018. -She will follow-up with Dr. Delton Coombes on Wednesday, 08/31/2018 for his second cycle of treatment.  2.  Intractable nausea: - He was first prescribed Compazine 10 mg to take every 6 hours at home which did not help. -He was then prescribed Zofran 8 mg ODT to be taken every 8 hours as needed.  He reports this helps a little.  But he is still getting nauseous with meals. - I have prescribed him a scopolamine patch to place at the end of his visit today on 08/29/2018.  He will let us know if this helps and we can send him a prescription for home. -I will give him 4 mg of IV Zofran with his IV fluids today on 08/29/2018.  3.  Weight loss: - He has lost 20 pounds since 08/10/2018. -He  reports he is unable to eat due to the nausea. - I am sending him home with a scopolamine patch.  I have also told him to drink 3-4 Ensure or boost high-calorie a day.

## 2018-08-29 NOTE — Patient Instructions (Signed)
[place] at [facility]  Discharge Instructions:  Today you received IV hydration, Zofran IV, and a scopalamine patch for nausea. You will return tomorrow for additional IV hydration. _______________________________________________________________  Thank you for choosing [place] at [facility] to provide your oncology and hematology care.  To afford each patient quality time with our providers, please arrive at least 15 minutes before your scheduled appointment.  You need to re-schedule your appointment if you arrive 10 or more minutes late.  We strive to give you quality time with our providers, and arriving late affects you and other patients whose appointments are after yours.  Also, if you no show three or more times for appointments you may be dismissed from the clinic.  Again, thank you for choosing [place] at [place] hope is that these requests will allow you access to exceptional care and in a timely manner. _______________________________________________________________  If you have questions after your visit, please contact our office at [phone] between the hours of 8:30 a.m. and 5:00 p.m. Voicemails left after 4:30 p.m. will not be returned until the following business day. _______________________________________________________________  For prescription refill requests, have your pharmacy contact our office. _______________________________________________________________  Recommendations made by the consultant and any test results will be sent to your referring physician. _______________________________________________________________

## 2018-08-30 ENCOUNTER — Ambulatory Visit (HOSPITAL_COMMUNITY): Payer: 59

## 2018-08-30 ENCOUNTER — Encounter (HOSPITAL_COMMUNITY): Payer: Self-pay

## 2018-08-30 ENCOUNTER — Inpatient Hospital Stay (HOSPITAL_COMMUNITY): Payer: 59

## 2018-08-30 VITALS — BP 112/75 | HR 69 | Temp 97.7°F | Resp 18 | Wt 204.0 lb

## 2018-08-30 DIAGNOSIS — Z5112 Encounter for antineoplastic immunotherapy: Secondary | ICD-10-CM | POA: Diagnosis not present

## 2018-08-30 DIAGNOSIS — C187 Malignant neoplasm of sigmoid colon: Secondary | ICD-10-CM

## 2018-08-30 DIAGNOSIS — R110 Nausea: Secondary | ICD-10-CM

## 2018-08-30 MED ORDER — SODIUM CHLORIDE 0.9 % IV SOLN
INTRAVENOUS | Status: DC
  Administered 2018-08-30: 14:00:00 via INTRAVENOUS
  Filled 2018-08-30 (×4): qty 1000

## 2018-08-30 MED ORDER — SODIUM CHLORIDE 0.9% FLUSH
10.0000 mL | Freq: Once | INTRAVENOUS | Status: AC
  Administered 2018-08-30: 10 mL

## 2018-08-30 MED ORDER — HEPARIN SOD (PORK) LOCK FLUSH 100 UNIT/ML IV SOLN
500.0000 [IU] | Freq: Once | INTRAVENOUS | Status: AC
  Administered 2018-08-30: 500 [IU] via INTRAVENOUS

## 2018-08-30 NOTE — Patient Instructions (Signed)
[facility] at [facility]  Discharge Instructions:   _______________________________________________________________  Thank you for choosing [facility] at [facility] to provide your oncology and hematology care.  To afford each patient quality time with our providers, please arrive at least 15 minutes before your scheduled appointment.  You need to re-schedule your appointment if you arrive 10 or more minutes late.  We strive to give you quality time with our providers, and arriving late affects you and other patients whose appointments are after yours.  Also, if you no show three or more times for appointments you may be dismissed from the clinic.  Again, thank you for choosing [facility] at [facility]. Our hope is that these requests will allow you access to exceptional care and in a timely manner. _______________________________________________________________  If you have questions after your visit, please contact our office at [phone] between the hours of 8:30 a.m. and 5:00 p.m. Voicemails left after 4:30 p.m. will not be returned until the following business day. _______________________________________________________________  For prescription refill requests, have your pharmacy contact our office. _______________________________________________________________  Recommendations made by the consultant and any test results will be sent to your referring physician. _______________________________________________________________ 

## 2018-08-30 NOTE — Progress Notes (Signed)
Pt presents today for IV fluids only. VSS. Pt has no complaints of any pain today. Pt states his nausea is much better. Pt states, "I am still having diarrhea but it's better". Spoke with RLockamy NP. VO received to administer house wine at 549ml/hr x 2 hours.   IV fluids/ house wine given today per MD orders. Tolerated infusion without adverse affects. Vital signs stable. No complaints at this time. Discharged from clinic ambulatory. F/U with [facility] as scheduled.

## 2018-08-31 ENCOUNTER — Encounter (HOSPITAL_COMMUNITY): Payer: Self-pay | Admitting: Hematology

## 2018-08-31 ENCOUNTER — Inpatient Hospital Stay (HOSPITAL_COMMUNITY): Payer: 59

## 2018-08-31 ENCOUNTER — Encounter (HOSPITAL_COMMUNITY): Payer: Self-pay | Admitting: *Deleted

## 2018-08-31 ENCOUNTER — Other Ambulatory Visit: Payer: Self-pay

## 2018-08-31 ENCOUNTER — Inpatient Hospital Stay (HOSPITAL_BASED_OUTPATIENT_CLINIC_OR_DEPARTMENT_OTHER): Payer: 59 | Admitting: Hematology

## 2018-08-31 ENCOUNTER — Encounter (HOSPITAL_COMMUNITY): Payer: Self-pay

## 2018-08-31 ENCOUNTER — Other Ambulatory Visit (HOSPITAL_COMMUNITY): Payer: Self-pay | Admitting: *Deleted

## 2018-08-31 VITALS — BP 101/60 | HR 64 | Temp 97.9°F | Resp 18

## 2018-08-31 VITALS — BP 125/80 | HR 82 | Temp 97.9°F | Resp 18 | Wt 206.0 lb

## 2018-08-31 DIAGNOSIS — E876 Hypokalemia: Secondary | ICD-10-CM

## 2018-08-31 DIAGNOSIS — C187 Malignant neoplasm of sigmoid colon: Secondary | ICD-10-CM

## 2018-08-31 DIAGNOSIS — R110 Nausea: Secondary | ICD-10-CM

## 2018-08-31 DIAGNOSIS — D500 Iron deficiency anemia secondary to blood loss (chronic): Secondary | ICD-10-CM

## 2018-08-31 DIAGNOSIS — R197 Diarrhea, unspecified: Secondary | ICD-10-CM

## 2018-08-31 DIAGNOSIS — C787 Secondary malignant neoplasm of liver and intrahepatic bile duct: Secondary | ICD-10-CM

## 2018-08-31 DIAGNOSIS — Z5112 Encounter for antineoplastic immunotherapy: Secondary | ICD-10-CM | POA: Diagnosis not present

## 2018-08-31 LAB — CBC WITH DIFFERENTIAL/PLATELET
Abs Immature Granulocytes: 0.09 10*3/uL — ABNORMAL HIGH (ref 0.00–0.07)
Basophils Absolute: 0 10*3/uL (ref 0.0–0.1)
Basophils Relative: 0 %
Eosinophils Absolute: 0.3 10*3/uL (ref 0.0–0.5)
Eosinophils Relative: 3 %
HCT: 32.9 % — ABNORMAL LOW (ref 39.0–52.0)
Hemoglobin: 11.5 g/dL — ABNORMAL LOW (ref 13.0–17.0)
Immature Granulocytes: 1 %
Lymphocytes Relative: 24 %
Lymphs Abs: 2.1 10*3/uL (ref 0.7–4.0)
MCH: 29.3 pg (ref 26.0–34.0)
MCHC: 35 g/dL (ref 30.0–36.0)
MCV: 83.7 fL (ref 80.0–100.0)
Monocytes Absolute: 0.9 10*3/uL (ref 0.1–1.0)
Monocytes Relative: 11 %
Neutro Abs: 5.5 10*3/uL (ref 1.7–7.7)
Neutrophils Relative %: 61 %
Platelets: 282 10*3/uL (ref 150–400)
RBC: 3.93 MIL/uL — ABNORMAL LOW (ref 4.22–5.81)
RDW: 13.7 % (ref 11.5–15.5)
WBC: 9 10*3/uL (ref 4.0–10.5)
nRBC: 0 % (ref 0.0–0.2)

## 2018-08-31 LAB — COMPREHENSIVE METABOLIC PANEL
ALT: 22 U/L (ref 0–44)
AST: 19 U/L (ref 15–41)
Albumin: 2.9 g/dL — ABNORMAL LOW (ref 3.5–5.0)
Alkaline Phosphatase: 66 U/L (ref 38–126)
Anion gap: 12 (ref 5–15)
BUN: 15 mg/dL (ref 8–23)
CO2: 23 mmol/L (ref 22–32)
Calcium: 8.7 mg/dL — ABNORMAL LOW (ref 8.9–10.3)
Chloride: 101 mmol/L (ref 98–111)
Creatinine, Ser: 1.63 mg/dL — ABNORMAL HIGH (ref 0.61–1.24)
GFR calc Af Amer: 52 mL/min — ABNORMAL LOW (ref >60)
GFR calc non Af Amer: 44 mL/min — ABNORMAL LOW (ref >60)
Glucose, Bld: 106 mg/dL — ABNORMAL HIGH (ref 70–99)
Potassium: 2.8 mmol/L — ABNORMAL LOW (ref 3.5–5.1)
Sodium: 136 mmol/L (ref 135–145)
Total Bilirubin: 1 mg/dL (ref 0.3–1.2)
Total Protein: 6.1 g/dL — ABNORMAL LOW (ref 6.5–8.1)

## 2018-08-31 MED ORDER — POTASSIUM CHLORIDE CRYS ER 20 MEQ PO TBCR: 20.0000 meq | EXTENDED_RELEASE_TABLET | Freq: Every day | ORAL | 3 refills | Status: DC

## 2018-08-31 MED ORDER — POTASSIUM CHLORIDE CRYS ER 20 MEQ PO TBCR
40.0000 meq | EXTENDED_RELEASE_TABLET | ORAL | Status: AC
  Administered 2018-08-31 (×2): 40 meq via ORAL
  Filled 2018-08-31: qty 2

## 2018-08-31 MED ORDER — SODIUM CHLORIDE 0.9 % IV SOLN
Freq: Once | INTRAVENOUS | Status: AC
  Administered 2018-08-31: 10:00:00 via INTRAVENOUS
  Filled 2018-08-31: qty 10

## 2018-08-31 MED ORDER — POTASSIUM CHLORIDE CRYS ER 20 MEQ PO TBCR
EXTENDED_RELEASE_TABLET | ORAL | Status: AC
  Filled 2018-08-31: qty 2

## 2018-08-31 MED ORDER — HEPARIN SOD (PORK) LOCK FLUSH 100 UNIT/ML IV SOLN
500.0000 [IU] | Freq: Once | INTRAVENOUS | Status: AC
  Administered 2018-08-31: 500 [IU] via INTRAVENOUS

## 2018-08-31 NOTE — Progress Notes (Signed)
Pt presents today for f/u appointment with Dr. Delton Coombes and treatment. VS within parameters for treatment. Labs pending. Pt has no complaints of pain today. Pt denies any significant changes since the last visit. Nausea is somewhat better per pt's words. Scopolamine patch on. Pt has concerns of waking up in the night with diarrhea. Pt taking Lomotil and Imodium as directed.   Per ATravis LPN. Hold tx today. Pt to receive 40mg  PO K+ pre and post house hydration fluids today.    IV hydration infused today per MD orders. Tolerated infusion without adverse affects. Vital signs stable. No complaints at this time. Discharged from clinic ambulatory. F/U with [facility] as scheduled.

## 2018-08-31 NOTE — Progress Notes (Signed)
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 Focal narrowing and wall thickening of the sigmoid colon is noted in this area most likely due to inflammation, but neoplasm cannot be excluded, and sigmoidoscopy is recommended for further evaluation.   03/20/2016 Procedure   Colonoscopy by Dr. Laural Golden- Malignant partially obstructing tumor in the distal sigmoid colon. Biopsied. - Incomplete exam.   03/23/2016 Pathology Results   Colon, biopsy, sigmoid mass - ADENOCARCINOMA.   03/23/2016 Procedure   Low anterior resection with transanal anastomosis   03/26/2016 Pathology Results   Colon, segmental resection for tumor, sigmoid - INVASIVE WELL DIFFERENTIATED ADENOCARCINOMA, SPANNING 5 CM IN GREATEST DIMENSION. - TUMOR INVADES THROUGH MUSCULARIS PROPRIA TO INVOLVE SUBSEROSAL SOFT TISSUES. - UNDERLYING MARKED ACUTE AND CHRONIC INFLAMMATION WITH GIANT CELL REACTION AND ABSCESS FORMATION EXTENDING TO THE SEROSAL SURFACE AND INVOLVING THE MESENTERIC TISSUE. - MARGINS ARE NEGATIVE FOR TUMOR. - FOURTEEN BENIGN LYMPH NODES WHICH DEMONSTRATE VARIABLE INVOLVEMENT WITH ACUTE AND CHRONIC INFLAMMATION WITH ASSOCIATED GIANT CELLS WITHOUT TUMOR IDENTIFIED (0/14).   04/08/2016 Cancer Staging   Cancer Staging Adenocarcinoma of sigmoid colon [facility]) Staging form: Colon and Rectum, AJCC 8th Edition - Pathologic stage from 04/01/2016: Stage IIA (pT3, pN0, cM0) - Signed by Baird Cancer, PA-C on 04/01/2016    07/09/2016 Imaging   CT abd/pelvis- No evidence of metastatic disease or  other acute findings.  Bilateral nonobstructing renal calculi and mild bilateral renal parenchymal scarring.  Stable mildly enlarged prostate gland and findings of chronic bladder outlet obstruction.   08/16/2018 -  Chemotherapy   The patient had palonosetron (ALOXI) injection 0.25 mg, 0.25 mg, Intravenous,  Once, 1 of 6 cycles Administration: 0.25 mg (08/16/2018) pegfilgrastim (NEULASTA) injection 6 mg, 6 mg, Subcutaneous, Once, 1 of 6 cycles Administration: 6 mg (08/18/2018) irinotecan (CAMPTOSAR) 380 mg in sodium chloride 0.9 % 500 mL chemo infusion, 165 mg/m2 = 380 mg, Intravenous,  Once, 1 of 6 cycles Administration: 380 mg (08/16/2018) leucovorin 900 mg in dextrose 5 % 250 mL infusion, 400 mg/m2 = 900 mg, Intravenous,  Once, 1 of 6 cycles Administration: 900 mg (08/16/2018) oxaliplatin (ELOXATIN) 190 mg in dextrose 5 % 500 mL chemo infusion, 85 mg/m2 = 190 mg, Intravenous,  Once, 1 of 6 cycles Administration: 190 mg (08/16/2018) fosaprepitant (EMEND) 150 mg, dexamethasone (DECADRON) 12 mg in sodium chloride 0.9 % 145 mL IVPB, , Intravenous,  Once, 1 of 6 cycles Administration:  (08/16/2018) fluorouracil (ADRUCIL) 5,400 mg in sodium chloride 0.9 % 142 mL chemo infusion, 2,400 mg/m2 = 5,400 mg, Intravenous, 1 Day/Dose, 1 of 6 cycles Administration: 5,400 mg (08/16/2018) bevacizumab-awwb (MVASI) 500 mg in sodium chloride 0.9 % 100 mL chemo infusion, 5 mg/kg = 500 mg (100 % of original dose 5 mg/kg), Intravenous,  Once, 1 of 6 cycles Dose modification: 5 mg/kg (original dose 5 mg/kg, Cycle 1) Administration: 500 mg (08/16/2018)  for chemotherapy treatment.       CANCER STAGING: Cancer Staging Adenocarcinoma of sigmoid colon [facility]) Staging form: Colon and Rectum, AJCC 8th Edition - Pathologic stage from 04/01/2016: Stage IIA (pT3, pN0, cM0) - Signed by Robynn Pane  S, PA-C on 04/01/2016    INTERVAL HISTORY:  [name] 63 y.o. male seen for follow-up and cycle 2 of chemotherapy.  Appetite is  50%.  Energy levels are 50%.  Complains of fatigue.  He started having diarrhea while he was waiting for him.  He describes 2 watery bowel movements per day.  He is taking Lomotil twice daily.  He also developed severe nausea and takes Compazine twice daily as needed.  Denied any vomiting all this time.  Denied any cold sensitivity.  No tingling or numbness in the extremities reported.  Eating is picking up at this time.  Still continues to be very weak.  Denies any fevers or chills.    REVIEW OF SYSTEMS:  Review of Systems  Constitutional: Positive for fatigue. Negative for appetite change.  Gastrointestinal: Positive for diarrhea and nausea.  All other systems reviewed and are negative.    PAST MEDICAL/SURGICAL HISTORY:  Past Medical History:  Diagnosis Date  . 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 Carpal tunnel syndrome 05/02/2014   Bilateral  . Cervical disc disorder with radiculopathy of cervical region 05/02/2014   Left C8  . Diabetes mellitus without complication ([place])   . Hypertension   . Port-A-Cath in place 08/10/2018   Past Surgical History:  Procedure Laterality Date  . BIOPSY  03/20/2016   Procedure: BIOPSY;  Surgeon: Rogene Houston, MD;  [facility];  Service: Endoscopy;;  colon  . BOWEL RESECTION N/A 03/23/2016   Procedure: LOW ANTERIOR RESECTION;  Surgeon: Aviva Signs, MD;  Location: [facility] ORS;  Service: General;  Laterality: N/A;  . COLONOSCOPY N/A 03/20/2016   Procedure: COLONOSCOPY;  Surgeon: Rogene Houston, MD;  Location: [facility];  Service: Endoscopy;  Laterality: N/A;  730  . COLONOSCOPY N/A 07/10/2016   Procedure: COLONOSCOPY;  Surgeon: Rogene Houston, MD;  Location: [facility];  Service: Endoscopy;  Laterality: N/A;  1040  . NO PAST SURGERIES    . POLYPECTOMY  07/10/2016   Procedure: POLYPECTOMY;  Surgeon: Rogene Houston, MD;  Location[facility];  Service: Endoscopy;;  colon  . PORTACATH PLACEMENT Left 08/05/2018   Procedure:  INSERTION PORT-A-CATH;  Surgeon: Aviva Signs, MD;  Location: [facility] ORS;  Service: General;  Laterality: Left;  . TONSILLECTOMY       SOCIAL HISTORY:  Social History   Socioeconomic History  . Marital status: Married    Spouse name: Not on file  . Number of children: Not on file  . Years of education: Not on file  . Highest education level: Not on file  Occupational History  . Not on file  Social Needs  . Financial resource strain: Not on file  . Food insecurity    Worry: Not on file    Inability: Not on file  . Transportation needs    Medical: Not on file    Non-medical: Not on file  Tobacco Use  . Smoking status: Never Smoker  . Smokeless tobacco: Never Used  Substance and Sexual Activity  . Alcohol use: Yes    Comment: rarely  . Drug use: No  . Sexual activity: Not on file  Lifestyle  . Physical activity    Days per week: Not on file    Minutes per session: Not on file  . Stress: Not on file  Relationships  . Social Herbalist on phone: Not on file    Gets together: Not on file  Attends religious service: Not on file    Active member of club or organization: Not on file    Attends meetings of clubs or organizations: Not on file    Relationship status: Not on file  . Intimate partner violence    Fear of current or ex partner: Not on file    Emotionally abused: Not on file    Physically abused: Not on file    Forced sexual activity: Not on file  Other Topics Concern  . Not on file  Social History Narrative  . Not on file    FAMILY HISTORY:  Family History  Problem Relation Age of Onset  . COPD Mother   . Melanoma Father     CURRENT MEDICATIONS:  Outpatient Encounter Medications as of 08/31/2018  Medication Sig  . amLODipine (NORVASC) 10 MG tablet Take 10 mg by mouth daily.  . Bevacizumab (AVASTIN IV) Inject into the vein every 14 (fourteen) days.  Marland Kitchen dextrose 5 % SOLN 1,000 mL with fluorouracil 5 GM/100ML SOLN Inject into the vein over 48  hr.  . diphenoxylate-atropine (LOMOTIL) 2.5-0.025 MG tablet Take 1 tablet by mouth 4 (four) times daily as needed for diarrhea or loose stools.  Marland Kitchen FLUOROURACIL IV Inject into the vein every 14 (fourteen) days.  . hydrochlorothiazide (HYDRODIURIL) 25 MG tablet Take 25 mg by mouth daily.   Marland Kitchen HYDROcodone-acetaminophen (NORCO) 5-325 MG tablet Take 1 tablet by mouth every 4 (four) hours as needed for moderate pain.  Marland Kitchen insulin glargine (LANTUS) 100 UNIT/ML injection Inject 30 Units into the skin at bedtime.   . IRINOTECAN HCL IV Inject into the vein every 14 (fourteen) days.  Marland Kitchen LEUCOVORIN CALCIUM IV Inject into the vein every 14 (fourteen) days.  Marland Kitchen lidocaine-prilocaine (EMLA) cream Apply a small amount to port a cath site and cover with plastic wrap 1 hour prior to chemotherapy appointments  . loperamide (IMODIUM A-D) 2 MG tablet Take 2 at onset of diarrhea, then 1 after each watery bowel movement.  . metoprolol succinate (TOPROL-XL) 50 MG 24 hr tablet Take 50 mg by mouth daily. Take with or immediately following a meal.  . ondansetron (ZOFRAN ODT) 8 MG disintegrating tablet Take 1 tablet (8 mg total) by mouth every 8 (eight) hours as needed for nausea or vomiting.  . OXALIPLATIN IV Inject into the vein every 14 (fourteen) days.  . potassium chloride SA (K-DUR) 20 MEQ tablet Take 1 tablet (20 mEq total) by mouth daily.  . prochlorperazine (COMPAZINE) 10 MG tablet Take 1 tablet (10 mg total) by mouth every 6 (six) hours as needed (Nausea or vomiting).  Marland Kitchen scopolamine (TRANSDERM-SCOP) 1 MG/3DAYS Place 1 patch (1.5 mg total) onto the skin every 3 (three) days.   No facility-administered encounter medications on file as of 08/31/2018.     ALLERGIES:  Allergies  Allergen Reactions  . Penicillin G Nausea And Vomiting     PHYSICAL EXAM:  ECOG Performance status: 1  Vital signs: Blood pressure is 125/80.  Pulse rate is 82.  Respiratory rate is 18.  Temperature 98.  Saturations 99%.  Physical Exam  Cardiovascular:     Rate and Rhythm: Normal rate and regular rhythm.     Heart sounds: Normal heart sounds.  Pulmonary:     Effort: Pulmonary effort is normal.     Breath sounds: Normal breath sounds.  Abdominal:     General: There is no distension.     Palpations: Abdomen is soft. There is no mass.  Musculoskeletal: Normal  range of motion.  Skin:    General: Skin is warm and dry.  Neurological:     Mental Status: He is alert and oriented to person, place, and time. Mental status is at baseline.  Psychiatric:        Mood and Affect: Mood normal.        Behavior: Behavior normal.        Thought Content: Thought content normal.        Judgment: Judgment normal.      LABORATORY DATA:  I have reviewed the labs as listed.  CBC    Component Value Date/Time   WBC 9.0 08/31/2018 0800   RBC 3.93 (L) 08/31/2018 0800   HGB 11.5 (L) 08/31/2018 0800   HCT 32.9 (L) 08/31/2018 0800   PLT 282 08/31/2018 0800   MCV 83.7 08/31/2018 0800   MCH 29.3 08/31/2018 0800   MCHC 35.0 08/31/2018 0800   RDW 13.7 08/31/2018 0800   LYMPHSABS 2.1 08/31/2018 0800   MONOABS 0.9 08/31/2018 0800   EOSABS 0.3 08/31/2018 0800   BASOSABS 0.0 08/31/2018 0800   CMP Latest Ref Rng & Units 08/31/2018 08/29/2018 08/26/2018  Glucose 70 - 99 mg/dL 106(H) 179(H) 144(H)  BUN 8 - 23 mg/dL 15 17 21   Creatinine 0.61 - 1.24 mg/dL 1.63(H) 2.15(H) 1.48(H)  Sodium 135 - 145 mmol/L 136 132(L) 133(L)  Potassium 3.5 - 5.1 mmol/L 2.8(L) 3.4(L) 3.1(L)  Chloride 98 - 111 mmol/L 101 97(L) 96(L)  CO2 22 - 32 mmol/L 23 23 27   Calcium 8.9 - 10.3 mg/dL 8.7(L) 8.9 9.3  Total Protein 6.5 - 8.1 g/dL 6.1(L) 6.7 6.4(L)  Total Bilirubin 0.3 - 1.2 mg/dL 1.0 0.7 0.8  Alkaline Phos 38 - 126 U/L 66 74 64  AST 15 - 41 U/L 19 18 13(L)  ALT 0 - 44 U/L 22 21 17        ASSESSMENT & PLAN:   Adenocarcinoma of sigmoid colon (H[facility]) 1.  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 - He  had experienced severe nausea and diarrhea after first cycle. -He had to come to our clinic for several days for IV fluids. -His creatinine today is elevated at 1.6.  I will hold his chemotherapy today as he is still weak.  He will receive IV hydration today. -I will reevaluate him in 1 week.  2.  Intractable nausea: -He is taking Compazine twice a day as needed.  I have told him to take Compazine in the morning and evening as a standing dose. -He also has Zofran 8 mg ODT as needed.  3.  Diarrhea: - His diarrhea started while he was wearing pump.  He was described as watery, 2 times per day.  He is taking Lomotil twice daily. -I have told him to increase Lomotil to 2 tablets at the first onset of diarrhea followed by 1 tablet after each watery bowel movement. - Because of the severe side effects we will send his sample for UGT1A1 testing. - We will also include atropine as a standing premedication prior to next cycle. -We will hold his chemotherapy until next week.  4.  Hypokalemia: -His potassium is low at 2.8. -His potassium was repleted today intravenously and orally.  We will start him on potassium 20 mEq daily.      Orders placed this encounter:  No orders of the defined types were placed in this encounter.    Francene Finders, FNP-C [facility] [phone]

## 2018-08-31 NOTE — Progress Notes (Signed)
I was asked to call patient's wife by Dr. Delton Coombes to ensure that patient is taking his medications correctly.  He is to take his nausea medication twice daily scheduled not as needed.  He can take additional as needed but should take it twice daily regardless.  He is also to take lomotil 2 tablets at onset of diarrhea and then take as instructed after that.    I was unable to get wife on the phone at this time. I will attempt to call her again after lunch today.

## 2018-08-31 NOTE — Progress Notes (Signed)
08/31/18  Received order for today:  1 Liter NS + Potassium Chloride 20 mEq + magnesium sulfate 2 gm to run over 2 hours x 1 bag.  Potassium chloride oral 40 mEq x 2 doses, 1 prior to IVF and 1 post IVF.  T.O. Dr Beckey Downing LPN/Zianne Schubring Ronnald Ramp, PharmD

## 2018-08-31 NOTE — Patient Instructions (Signed)
[facility] at [facility]  Discharge Instructions:   _______________________________________________________________  Thank you for choosing [facility] at [facility] to provide your oncology and hematology care.  To afford each patient quality time with our providers, please arrive at least 15 minutes before your scheduled appointment.  You need to re-schedule your appointment if you arrive 10 or more minutes late.  We strive to give you quality time with our providers, and arriving late affects you and other patients whose appointments are after yours.  Also, if you no show three or more times for appointments you may be dismissed from the clinic.  Again, thank you for choosing [facility] at [facility]. Our hope is that these requests will allow you access to exceptional care and in a timely manner. _______________________________________________________________  If you have questions after your visit, please contact our office at [phone] between the hours of 8:30 a.m. and 5:00 p.m. Voicemails left after 4:30 p.m. will not be returned until the following business day. _______________________________________________________________  For prescription refill requests, have your pharmacy contact our office. _______________________________________________________________  Recommendations made by the consultant and any test results will be sent to your referring physician. _______________________________________________________________ 

## 2018-08-31 NOTE — Assessment & Plan Note (Signed)
1.  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 - He had experienced severe nausea and diarrhea after first cycle. -He had to come to our clinic for several days for IV fluids. -His creatinine today is elevated at 1.6.  I will hold his chemotherapy today as he is still weak.  He will receive IV hydration today. -I will reevaluate him in 1 week.  2.  Intractable nausea: -He is taking Compazine twice a day as needed.  I have told him to take Compazine in the morning and evening as a standing dose. -He also has Zofran 8 mg ODT as needed.  3.  Diarrhea: - His diarrhea started while he was wearing pump.  He was described as watery, 2 times per day.  He is taking Lomotil twice daily. -I have told him to increase Lomotil to 2 tablets at the first onset of diarrhea followed by 1 tablet after each watery bowel movement. - Because of the severe side effects we will send his sample for UGT1A1 testing. - We will also include atropine as a standing premedication prior to next cycle. -We will hold his chemotherapy until next week.  4.  Hypokalemia: -His potassium is low at 2.8. -His potassium was repleted today intravenously and orally.  We will start him on potassium 20 mEq daily.

## 2018-08-31 NOTE — Patient Instructions (Signed)
[place] at [facility] Discharge Instructions  You were seen today by Dr. Delton Coombes. He went over your recent lab results. He will see you back in 1 week for labs and follow up.  STOP taking the Hydrochlorothiazide. Take your nausea medication every day twice a day even if you're not nauseated to stay ahead of the nausea. START taking the potassium daily, Rx sent to your pharmacy.  Thank you for choosing [place] at [facility] to provide your oncology and hematology care.  To afford each patient quality time with our provider, please arrive at least 15 minutes before your scheduled appointment time.   If you have a lab appointment with the [place] [place] please come in thru the  Main Entrance and check in at the main information desk  You need to re-schedule your appointment should you arrive 10 or more minutes late.  We strive to give you quality time with our providers, and arriving late affects you and other patients whose appointments are after yours.  Also, if you no show three or more times for appointments you may be dismissed from the clinic at the providers discretion.     Again, thank you for choosing [facility]  Our hope is that these requests will decrease the amount of time that you wait before being seen by our physicians.       _____________________________________________________________  Should you have questions after your visit to [facility], please contact our office at [phone] between the hours of 8:00 a.m. and 4:30 p.m.  Voicemails left after 4:00 p.m. will not be returned until the following business day.  For prescription refill requests, have your pharmacy contact our office and allow 72 hours.    Cancer Center Support Programs:   > Cancer Support Group  2nd Tuesday of the month 1pm-2pm, Journey Room

## 2018-09-02 ENCOUNTER — Inpatient Hospital Stay (HOSPITAL_COMMUNITY): Payer: 59

## 2018-09-02 ENCOUNTER — Other Ambulatory Visit: Payer: Self-pay

## 2018-09-02 ENCOUNTER — Encounter (HOSPITAL_COMMUNITY): Payer: 59

## 2018-09-02 ENCOUNTER — Ambulatory Visit (HOSPITAL_COMMUNITY): Payer: 59

## 2018-09-02 VITALS — BP 96/63 | HR 60 | Temp 96.8°F | Resp 18

## 2018-09-02 DIAGNOSIS — D500 Iron deficiency anemia secondary to blood loss (chronic): Secondary | ICD-10-CM

## 2018-09-02 DIAGNOSIS — Z5112 Encounter for antineoplastic immunotherapy: Secondary | ICD-10-CM | POA: Diagnosis not present

## 2018-09-02 MED ORDER — HEPARIN SOD (PORK) LOCK FLUSH 100 UNIT/ML IV SOLN
500.0000 [IU] | Freq: Once | INTRAVENOUS | Status: AC
  Administered 2018-09-02: 500 [IU] via INTRAVENOUS

## 2018-09-02 MED ORDER — SODIUM CHLORIDE 0.9 % IV SOLN
Freq: Once | INTRAVENOUS | Status: AC
  Administered 2018-09-02: 10:00:00 via INTRAVENOUS
  Filled 2018-09-02: qty 10

## 2018-09-02 NOTE — Patient Instructions (Signed)
[facility] at [facility]  Discharge Instructions:  IV hydration received today. _______________________________________________________________  Thank you for choosing [facility] at [facility] to provide your oncology and hematology care.  To afford each patient quality time with our providers, please arrive at least 15 minutes before your scheduled appointment.  You need to re-schedule your appointment if you arrive 10 or more minutes late.  We strive to give you quality time with our providers, and arriving late affects you and other patients whose appointments are after yours.  Also, if you no show three or more times for appointments you may be dismissed from the clinic.  Again, thank you for choosing [facility] at [facility]. Our hope is that these requests will allow you access to exceptional care and in a timely manner. _______________________________________________________________  If you have questions after your visit, please contact our office at [phone] between the hours of 8:30 a.m. and 5:00 p.m. Voicemails left after 4:30 p.m. will not be returned until the following business day. _______________________________________________________________  For prescription refill requests, have your pharmacy contact our office. _______________________________________________________________  Recommendations made by the consultant and any test results will be sent to your referring physician. _______________________________________________________________ 

## 2018-09-02 NOTE — Progress Notes (Signed)
Troy Smith presents today for IV hydration. Hydration given per orders. Pt tolerated without incident or complaint. VSS. Discharged self ambulatory in satisfactory condition.

## 2018-09-07 ENCOUNTER — Encounter (HOSPITAL_COMMUNITY): Payer: Self-pay | Admitting: Hematology

## 2018-09-07 ENCOUNTER — Inpatient Hospital Stay (HOSPITAL_COMMUNITY): Payer: 59

## 2018-09-07 ENCOUNTER — Other Ambulatory Visit (HOSPITAL_COMMUNITY): Payer: Self-pay | Admitting: *Deleted

## 2018-09-07 ENCOUNTER — Telehealth (HOSPITAL_COMMUNITY): Payer: Self-pay | Admitting: *Deleted

## 2018-09-07 ENCOUNTER — Other Ambulatory Visit: Payer: Self-pay

## 2018-09-07 ENCOUNTER — Inpatient Hospital Stay (HOSPITAL_BASED_OUTPATIENT_CLINIC_OR_DEPARTMENT_OTHER): Payer: 59 | Admitting: Hematology

## 2018-09-07 VITALS — BP 110/69 | HR 75 | Temp 97.6°F | Resp 18 | Wt 199.0 lb

## 2018-09-07 DIAGNOSIS — C787 Secondary malignant neoplasm of liver and intrahepatic bile duct: Secondary | ICD-10-CM

## 2018-09-07 DIAGNOSIS — Z95828 Presence of other vascular implants and grafts: Secondary | ICD-10-CM

## 2018-09-07 DIAGNOSIS — R110 Nausea: Secondary | ICD-10-CM | POA: Diagnosis not present

## 2018-09-07 DIAGNOSIS — C187 Malignant neoplasm of sigmoid colon: Secondary | ICD-10-CM

## 2018-09-07 DIAGNOSIS — E876 Hypokalemia: Secondary | ICD-10-CM

## 2018-09-07 DIAGNOSIS — E119 Type 2 diabetes mellitus without complications: Secondary | ICD-10-CM

## 2018-09-07 DIAGNOSIS — R197 Diarrhea, unspecified: Secondary | ICD-10-CM | POA: Diagnosis not present

## 2018-09-07 DIAGNOSIS — D500 Iron deficiency anemia secondary to blood loss (chronic): Secondary | ICD-10-CM

## 2018-09-07 DIAGNOSIS — I10 Essential (primary) hypertension: Secondary | ICD-10-CM

## 2018-09-07 DIAGNOSIS — Z5112 Encounter for antineoplastic immunotherapy: Secondary | ICD-10-CM | POA: Diagnosis not present

## 2018-09-07 LAB — CBC WITH DIFFERENTIAL/PLATELET
Abs Immature Granulocytes: 0.08 10*3/uL — ABNORMAL HIGH (ref 0.00–0.07)
Basophils Absolute: 0.1 10*3/uL (ref 0.0–0.1)
Basophils Relative: 1 %
Eosinophils Absolute: 0 10*3/uL (ref 0.0–0.5)
Eosinophils Relative: 0 %
HCT: 31.7 % — ABNORMAL LOW (ref 39.0–52.0)
Hemoglobin: 10.9 g/dL — ABNORMAL LOW (ref 13.0–17.0)
Immature Granulocytes: 1 %
Lymphocytes Relative: 14 %
Lymphs Abs: 1.4 10*3/uL (ref 0.7–4.0)
MCH: 28.5 pg (ref 26.0–34.0)
MCHC: 34.4 g/dL (ref 30.0–36.0)
MCV: 82.8 fL (ref 80.0–100.0)
Monocytes Absolute: 0.9 10*3/uL (ref 0.1–1.0)
Monocytes Relative: 9 %
Neutro Abs: 7.8 10*3/uL — ABNORMAL HIGH (ref 1.7–7.7)
Neutrophils Relative %: 75 %
Platelets: 394 10*3/uL (ref 150–400)
RBC: 3.83 MIL/uL — ABNORMAL LOW (ref 4.22–5.81)
RDW: 14 % (ref 11.5–15.5)
WBC: 10.3 10*3/uL (ref 4.0–10.5)
nRBC: 0 % (ref 0.0–0.2)

## 2018-09-07 LAB — COMPREHENSIVE METABOLIC PANEL
ALT: 21 U/L (ref 0–44)
AST: 16 U/L (ref 15–41)
Albumin: 2.8 g/dL — ABNORMAL LOW (ref 3.5–5.0)
Alkaline Phosphatase: 73 U/L (ref 38–126)
Anion gap: 12 (ref 5–15)
BUN: 16 mg/dL (ref 8–23)
CO2: 25 mmol/L (ref 22–32)
Calcium: 9.2 mg/dL (ref 8.9–10.3)
Chloride: 96 mmol/L — ABNORMAL LOW (ref 98–111)
Creatinine, Ser: 2.18 mg/dL — ABNORMAL HIGH (ref 0.61–1.24)
GFR calc Af Amer: 36 mL/min — ABNORMAL LOW (ref >60)
GFR calc non Af Amer: 31 mL/min — ABNORMAL LOW (ref >60)
Glucose, Bld: 173 mg/dL — ABNORMAL HIGH (ref 70–99)
Potassium: 2.9 mmol/L — ABNORMAL LOW (ref 3.5–5.1)
Sodium: 133 mmol/L — ABNORMAL LOW (ref 135–145)
Total Bilirubin: 0.9 mg/dL (ref 0.3–1.2)
Total Protein: 6.3 g/dL — ABNORMAL LOW (ref 6.5–8.1)

## 2018-09-07 MED ORDER — SODIUM CHLORIDE 0.9% FLUSH
10.0000 mL | INTRAVENOUS | Status: DC | PRN
  Administered 2018-09-07: 10 mL
  Filled 2018-09-07: qty 10

## 2018-09-07 MED ORDER — SODIUM CHLORIDE 0.9 % IV SOLN
5.0000 mg/kg | Freq: Once | INTRAVENOUS | Status: AC
  Administered 2018-09-07: 500 mg via INTRAVENOUS
  Filled 2018-09-07: qty 4

## 2018-09-07 MED ORDER — PALONOSETRON HCL INJECTION 0.25 MG/5ML
0.2500 mg | Freq: Once | INTRAVENOUS | Status: AC
  Administered 2018-09-07: 0.25 mg via INTRAVENOUS
  Filled 2018-09-07: qty 5

## 2018-09-07 MED ORDER — DEXTROSE 5 % IV SOLN
Freq: Once | INTRAVENOUS | Status: AC
  Administered 2018-09-07: 12:00:00 via INTRAVENOUS

## 2018-09-07 MED ORDER — MAGIC MOUTHWASH W/LIDOCAINE: 5.0000 mL | Freq: Four times a day (QID) | ORAL | 3 refills | Status: DC | PRN

## 2018-09-07 MED ORDER — SODIUM CHLORIDE 0.9 % IV SOLN
2400.0000 mg/m2 | INTRAVENOUS | Status: DC
  Administered 2018-09-07: 5400 mg via INTRAVENOUS
  Filled 2018-09-07: qty 100

## 2018-09-07 MED ORDER — ATROPINE SULFATE 1 MG/ML IJ SOLN
0.5000 mg | Freq: Once | INTRAMUSCULAR | Status: AC | PRN
  Administered 2018-09-07: 0.5 mg via INTRAVENOUS
  Filled 2018-09-07: qty 1

## 2018-09-07 MED ORDER — SODIUM CHLORIDE 0.9 % IV SOLN
INTRAVENOUS | Status: DC
  Administered 2018-09-07: 10:00:00 via INTRAVENOUS

## 2018-09-07 MED ORDER — OXALIPLATIN CHEMO INJECTION 100 MG/20ML
66.0000 mg/m2 | Freq: Once | INTRAVENOUS | Status: AC
  Administered 2018-09-07: 12:00:00 150 mg via INTRAVENOUS
  Filled 2018-09-07: qty 20

## 2018-09-07 MED ORDER — LEUCOVORIN CALCIUM INJECTION 350 MG
400.0000 mg/m2 | Freq: Once | INTRAVENOUS | Status: AC
  Administered 2018-09-07: 12:00:00 900 mg via INTRAVENOUS
  Filled 2018-09-07: qty 35

## 2018-09-07 MED ORDER — SODIUM CHLORIDE 0.9 % IV SOLN
Freq: Once | INTRAVENOUS | Status: AC
  Administered 2018-09-07: 10:00:00 via INTRAVENOUS
  Filled 2018-09-07: qty 5

## 2018-09-07 MED ORDER — SODIUM CHLORIDE 0.9 % IV SOLN
Freq: Once | INTRAVENOUS | Status: AC
  Administered 2018-09-07: 09:00:00 via INTRAVENOUS
  Filled 2018-09-07: qty 10

## 2018-09-07 NOTE — Patient Instructions (Addendum)
[place] at [facility] Discharge Instructions  You were seen today by Dr. Delton Coombes. He went over your recent lab results. He will see you back in 2 week for labs, treatment and follow up. He will schedule you for fluids Friday and again on Monday.  STOP taking HCTZ!  Thank you for choosing [place] at [facility] to provide your oncology and hematology care.  To afford each patient quality time with our provider, please arrive at least 15 minutes before your scheduled appointment time.   If you have a lab appointment with the [facility] please come in thru the  Main Entrance and check in at the main information desk  You need to re-schedule your appointment should you arrive 10 or more minutes late.  We strive to give you quality time with our providers, and arriving late affects you and other patients whose appointments are after yours.  Also, if you no show three or more times for appointments you may be dismissed from the clinic at the providers discretion.     Again, thank you for choosing [facility].  Our hope is that these requests will decrease the amount of time that you wait before being seen by our physicians.       _____________________________________________________________  Should you have questions after your visit to [facility], please contact our office at [phone] between the hours of 8:00 a.m. and 4:30 p.m.  Voicemails left after 4:00 p.m. will not be returned until the following business day.  For prescription refill requests, have your pharmacy contact our office and allow 72 hours.    Cancer Center Support Programs:   > Cancer Support Group  2nd Tuesday of the month 1pm-2pm, Journey Room

## 2018-09-07 NOTE — Telephone Encounter (Signed)
Wife called into clinic stating this patient has mouth sores and mouth pain. Dr. Delton Coombes made aware. Provider prescribed magic mouth wash with lidocaine 5 ml by mouth 4 times a day as needed for mouth sore and pain. Called wife back to let her know, what Dr. Raliegh Ip prescribed she verbalized understanding.

## 2018-09-07 NOTE — Progress Notes (Signed)
0830 Labs reviewed with and pt seen by Dr. Delton Coombes and pt approved for chemo tx today without the Irinotecan and with added IV hydration including potassium and magnesium per MD                                                                  Troy Smith tolerated chemo tx with IV hydration well without complaints or incident. Pt discharged with 5FU pump infusing without issues. VSS upon discharge. Pt discharged self ambulatory in satisfactory condition

## 2018-09-07 NOTE — Progress Notes (Signed)
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 Focal narrowing and wall thickening of the sigmoid colon is noted in this area most likely due to inflammation, but neoplasm cannot be excluded, and sigmoidoscopy is recommended for further evaluation.   03/20/2016 Procedure   Colonoscopy by Dr. Laural Golden- Malignant partially obstructing tumor in the distal sigmoid colon. Biopsied. - Incomplete exam.   03/23/2016 Pathology Results   Colon, biopsy, sigmoid mass - ADENOCARCINOMA.   03/23/2016 Procedure   Low anterior resection with transanal anastomosis   03/26/2016 Pathology Results   Colon, segmental resection for tumor, sigmoid - INVASIVE WELL DIFFERENTIATED ADENOCARCINOMA, SPANNING 5 CM IN GREATEST DIMENSION. - TUMOR INVADES THROUGH MUSCULARIS PROPRIA TO INVOLVE SUBSEROSAL SOFT TISSUES. - UNDERLYING MARKED ACUTE AND CHRONIC INFLAMMATION WITH GIANT CELL REACTION AND ABSCESS FORMATION EXTENDING TO THE SEROSAL SURFACE AND INVOLVING THE MESENTERIC TISSUE. - MARGINS ARE NEGATIVE FOR TUMOR. - FOURTEEN BENIGN LYMPH NODES WHICH DEMONSTRATE VARIABLE INVOLVEMENT WITH ACUTE AND CHRONIC INFLAMMATION WITH ASSOCIATED GIANT CELLS WITHOUT TUMOR IDENTIFIED (0/14).   04/08/2016 Cancer Staging   Cancer Staging Adenocarcinoma of sigmoid colon [facility]) Staging form: Colon and Rectum, AJCC 8th Edition - Pathologic stage from 04/01/2016: Stage IIA (pT3, pN0, cM0) - Signed by Baird Cancer, PA-C on 04/01/2016    07/09/2016 Imaging   CT abd/pelvis- No evidence of metastatic disease or  other acute findings.  Bilateral nonobstructing renal calculi and mild bilateral renal parenchymal scarring.  Stable mildly enlarged prostate gland and findings of chronic bladder outlet obstruction.   08/16/2018 -  Chemotherapy   The patient had palonosetron (ALOXI) injection 0.25 mg, 0.25 mg, Intravenous,  Once, 2 of 6 cycles Administration: 0.25 mg (08/16/2018), 0.25 mg (09/07/2018) pegfilgrastim (NEULASTA) injection 6 mg, 6 mg, Subcutaneous, Once, 2 of 6 cycles Administration: 6 mg (08/18/2018) irinotecan (CAMPTOSAR) 380 mg in sodium chloride 0.9 % 500 mL chemo infusion, 165 mg/m2 = 380 mg, Intravenous,  Once, 1 of 5 cycles Administration: 380 mg (08/16/2018) leucovorin 900 mg in dextrose 5 % 250 mL infusion, 400 mg/m2 = 900 mg, Intravenous,  Once, 2 of 6 cycles Administration: 900 mg (08/16/2018), 900 mg (09/07/2018) oxaliplatin (ELOXATIN) 190 mg in dextrose 5 % 500 mL chemo infusion, 85 mg/m2 = 190 mg, Intravenous,  Once, 2 of 6 cycles Dose modification: 68 mg/m2 (80 % of original dose 85 mg/m2, Cycle 2, Reason: Provider Judgment) Administration: 190 mg (08/16/2018), 150 mg (09/07/2018) fosaprepitant (EMEND) 150 mg, dexamethasone (DECADRON) 12 mg in sodium chloride 0.9 % 145 mL IVPB, , Intravenous,  Once, 2 of 6 cycles Administration:  (08/16/2018),  (09/07/2018) fluorouracil (ADRUCIL) 5,400 mg in sodium chloride 0.9 % 142 mL chemo infusion, 2,400 mg/m2 = 5,400 mg, Intravenous, 1 Day/Dose, 2 of 6 cycles Administration: 5,400 mg (08/16/2018) bevacizumab-awwb (MVASI) 500 mg in sodium chloride 0.9 % 100 mL chemo infusion, 5 mg/kg = 500 mg (100 % of original dose 5 mg/kg), Intravenous,  Once, 2 of 6 cycles Dose modification: 5 mg/kg (original dose 5 mg/kg, Cycle 1), 5 mg/kg (original dose 5 mg/kg, Cycle 2) Administration: 500 mg (08/16/2018), 500 mg (09/07/2018)  for chemotherapy treatment.  CANCER STAGING: Cancer Staging Adenocarcinoma of sigmoid colon [facility]) Staging form: Colon and Rectum, AJCC  8th Edition - Pathologic stage from 04/01/2016: Stage IIA (pT3, pN0, cM0) - Signed by Baird Cancer, PA-C on 04/01/2016    INTERVAL HISTORY:  Troy Smith 63 y.o. male seen for follow-up and cycle 2 of chemotherapy.  He has on and off diarrhea for which she required Lomotil 3 times last week.  He is drinking 1 Ensure per day.  Denies any tingling or numbness next 20s.  He had episode of nausea and vomited last night.  He is reportedly continuing to take hydrochlorothiazide.  I have told him to stop it 2 weeks ago.  His appetite is 25%.  Energy levels are 50%.    REVIEW OF SYSTEMS:  Review of Systems  Constitutional: Positive for fatigue. Negative for appetite change.  Gastrointestinal: Positive for diarrhea and nausea.  All other systems reviewed and are negative.    PAST MEDICAL/SURGICAL HISTORY:  Past Medical History:  Diagnosis Date  . 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 Carpal tunnel syndrome 05/02/2014   Bilateral  . Cervical disc disorder with radiculopathy of cervical region 05/02/2014   Left C8  . Diabetes mellitus without complication ([place])   . Hypertension   . Port-A-Cath in place 08/10/2018   Past Surgical History:  Procedure Laterality Date  . BIOPSY  03/20/2016   Procedure: BIOPSY;  Surgeon: Rogene Houston, MD;  Location[facility];  Service: Endoscopy;;  colon  . BOWEL RESECTION N/A 03/23/2016   Procedure: LOW ANTERIOR RESECTION;  Surgeon: Aviva Signs, MD;  Location: [facility] ORS;  Service: General;  Laterality: N/A;  . COLONOSCOPY N/A 03/20/2016   Procedure: COLONOSCOPY;  Surgeon: Rogene Houston, MD;  Location: [facility] ENDO SU[facility];  Service: Endoscopy;  Laterality: N/A;  730  . COLONOSCOPY N/A 07/10/2016   Procedure: COLONOSCOPY;  Surgeon: Rogene Houston, MD;  Location: [facility];  Service: Endoscopy;  Laterality: N/A;  1040  . NO PAST SURGERIES    . POLYPECTOMY  07/10/2016   Procedure: POLYPECTOMY;  Surgeon: Rogene Houston, MD;  Location: [facility];   Service: Endoscopy;;  colon  . PORTACATH PLACEMENT Left 08/05/2018   Procedure: INSERTION PORT-A-CATH;  Surgeon: Aviva Signs, MD;  Location: [facility] ORS;  Service: General;  Laterality: Left;  . TONSILLECTOMY       SOCIAL HISTORY:  Social History   Socioeconomic History  . Marital status: Married    Spouse name: Not on file  . Number of children: Not on file  . Years of education: Not on file  . Highest education level: Not on file  Occupational History  . Not on file  Social Needs  . Financial resource strain: Not on file  . Food insecurity    Worry: Not on file    Inability: Not on file  . Transportation needs    Medical: Not on file    Non-medical: Not on file  Tobacco Use  . Smoking status: Never Smoker  . Smokeless tobacco: Never Used  Substance and Sexual Activity  . Alcohol use: Yes    Comment: rarely  . Drug use: No  . Sexual activity: Not on file  Lifestyle  . Physical activity    Days per week: Not on file    Minutes per session: Not on file  . Stress: Not on file  Relationships  . Social connections    Talks on phone: Not on file  Gets together: Not on file    Attends religious service: Not on file    Active member of club or organization: Not on file    Attends meetings of clubs or organizations: Not on file    Relationship status: Not on file  . Intimate partner violence    Fear of current or ex partner: Not on file    Emotionally abused: Not on file    Physically abused: Not on file    Forced sexual activity: Not on file  Other Topics Concern  . Not on file  Social History Narrative  . Not on file    FAMILY HISTORY:  Family History  Problem Relation Age of Onset  . COPD Mother   . Melanoma Father     CURRENT MEDICATIONS:  Outpatient Encounter Medications as of 09/07/2018  Medication Sig  . amLODipine (NORVASC) 10 MG tablet Take 10 mg by mouth daily.  . Bevacizumab (AVASTIN IV) Inject into the vein every 14 (fourteen) days.  Marland Kitchen dextrose 5  % SOLN 1,000 mL with fluorouracil 5 GM/100ML SOLN Inject into the vein over 48 hr.  . diphenoxylate-atropine (LOMOTIL) 2.5-0.025 MG tablet Take 1 tablet by mouth 4 (four) times daily as needed for diarrhea or loose stools.  Marland Kitchen FLUOROURACIL IV Inject into the vein every 14 (fourteen) days.  . hydrochlorothiazide (HYDRODIURIL) 25 MG tablet Take 25 mg by mouth daily.   Marland Kitchen HYDROcodone-acetaminophen (NORCO) 5-325 MG tablet Take 1 tablet by mouth every 4 (four) hours as needed for moderate pain.  Marland Kitchen insulin glargine (LANTUS) 100 UNIT/ML injection Inject 30 Units into the skin at bedtime.   . IRINOTECAN HCL IV Inject into the vein every 14 (fourteen) days.  Marland Kitchen LEUCOVORIN CALCIUM IV Inject into the vein every 14 (fourteen) days.  Marland Kitchen lidocaine-prilocaine (EMLA) cream Apply a small amount to port a cath site and cover with plastic wrap 1 hour prior to chemotherapy appointments  . loperamide (IMODIUM A-D) 2 MG tablet Take 2 at onset of diarrhea, then 1 after each watery bowel movement.  . metoprolol succinate (TOPROL-XL) 50 MG 24 hr tablet Take 50 mg by mouth daily. Take with or immediately following a meal.  . ondansetron (ZOFRAN ODT) 8 MG disintegrating tablet Take 1 tablet (8 mg total) by mouth every 8 (eight) hours as needed for nausea or vomiting.  . OXALIPLATIN IV Inject into the vein every 14 (fourteen) days.  . potassium chloride SA (K-DUR) 20 MEQ tablet Take 1 tablet (20 mEq total) by mouth daily.  . prochlorperazine (COMPAZINE) 10 MG tablet Take 1 tablet (10 mg total) by mouth every 6 (six) hours as needed (Nausea or vomiting).  Marland Kitchen scopolamine (TRANSDERM-SCOP) 1 MG/3DAYS Place 1 patch (1.5 mg total) onto the skin every 3 (three) days.   No facility-administered encounter medications on file as of 09/07/2018.     ALLERGIES:  Allergies  Allergen Reactions  . Penicillin G Nausea And Vomiting     PHYSICAL EXAM:  ECOG Performance status: 1  Vital signs: Blood pressure is 111/85.  Pulse rate is 84.   Respiratory is 18.  Temperature 98.1.  Oxygen saturations are 100.  Physical Exam Cardiovascular:     Rate and Rhythm: Normal rate and regular rhythm.     Heart sounds: Normal heart sounds.  Pulmonary:     Effort: Pulmonary effort is normal.     Breath sounds: Normal breath sounds.  Abdominal:     General: There is no distension.     Palpations: Abdomen  is soft. There is no mass.  Musculoskeletal: Normal range of motion.  Skin:    General: Skin is warm and dry.  Neurological:     Mental Status: He is alert and oriented to person, place, and time. Mental status is at baseline.  Psychiatric:        Mood and Affect: Mood normal.        Behavior: Behavior normal.        Thought Content: Thought content normal.        Judgment: Judgment normal.      LABORATORY DATA:  I have reviewed the labs as listed.  CBC    Component Value Date/Time   WBC 10.3 09/07/2018 0747   RBC 3.83 (L) 09/07/2018 0747   HGB 10.9 (L) 09/07/2018 0747   HCT 31.7 (L) 09/07/2018 0747   PLT 394 09/07/2018 0747   MCV 82.8 09/07/2018 0747   MCH 28.5 09/07/2018 0747   MCHC 34.4 09/07/2018 0747   RDW 14.0 09/07/2018 0747   LYMPHSABS 1.4 09/07/2018 0747   MONOABS 0.9 09/07/2018 0747   EOSABS 0.0 09/07/2018 0747   BASOSABS 0.1 09/07/2018 0747   CMP Latest Ref Rng & Units 09/07/2018 08/31/2018 08/29/2018  Glucose 70 - 99 mg/dL 173(H) 106(H) 179(H)  BUN 8 - 23 mg/dL 16 15 17   Creatinine 0.61 - 1.24 mg/dL 2.18(H) 1.63(H) 2.15(H)  Sodium 135 - 145 mmol/L 133(L) 136 132(L)  Potassium 3.5 - 5.1 mmol/L 2.9(L) 2.8(L) 3.4(L)  Chloride 98 - 111 mmol/L 96(L) 101 97(L)  CO2 22 - 32 mmol/L 25 23 23   Calcium 8.9 - 10.3 mg/dL 9.2 8.7(L) 8.9  Total Protein 6.5 - 8.1 g/dL 6.3(L) 6.1(L) 6.7  Total Bilirubin 0.3 - 1.2 mg/dL 0.9 1.0 0.7  Alkaline Phos 38 - 126 U/L 73 66 74  AST 15 - 41 U/L 16 19 18   ALT 0 - 44 U/L 21 22 21        ASSESSMENT & PLAN:   Adenocarcinoma of sigmoid colon (H[facility]) 1.  Metastatic sigmoid colon  cancer to the liver: - Cycle 1 of FOLFOXIRI and bevacizumab on 08/16/2018. - He had experienced severe nausea and diarrhea after first cycle. -He had to come to our clinic for several days for IV fluids. - He had one episode of nausea and vomiting last night.  He is drinking about 1 Ensure per day.  Denies any tingling or numbness in extremities. -We reviewed his labs today.  He will proceed with cycle 2 of FOLFOX.  I will discontinue Irinotecan until we find out UGT1A1 status.  I will also dose reduce oxaliplatin. - He will receive fluids for elevated creatinine today.  I have told him to discontinue hydrochlorothiazide.  He will come back Friday and will receive fluids.  Will repeat labs on Monday and give him fluids. -His wife reportedly called our clinic and told us that he had mucositis.  We will call in Magic mouthwash.  2.  Intractable nausea: -He was told to take Compazine twice daily.  He will also use Zofran 8 mg ODT as needed.  3.  Diarrhea: - This is the most bothering side effect from his chemo. - Because of the severe side effects we will send his sample for UGT1A1 testing. - I have discontinued Irinotecan with cycle 2.  He took Lomotil 3 times in the last 1 week.  4.  Hypokalemia: -Potassium today is low at 2.9. -He is not taking potassium on a regular basis.  I have reinforced him  to take potassium every day. -He will receive potassium by IV and p.o. today.      Orders placed this encounter:  No orders of the defined types were placed in this encounter.    Derek Jack, MD [place] [phone]

## 2018-09-07 NOTE — Assessment & Plan Note (Addendum)
1.  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 - He had experienced severe nausea and diarrhea after first cycle. -He had to come to our clinic for several days for IV fluids. - He had one episode of nausea and vomiting last night.  He is drinking about 1 Ensure per day.  Denies any tingling or numbness in extremities. -We reviewed his labs today.  He will proceed with cycle 2 of FOLFOX.  I will discontinue Irinotecan until we find out UGT1A1 status.  I will also dose reduce oxaliplatin. - He will receive fluids for elevated creatinine today.  I have told him to discontinue hydrochlorothiazide.  He will come back Friday and will receive fluids.  Will repeat labs on Monday and give him fluids. -His wife reportedly called our clinic and told us that he had mucositis.  We will call in Magic mouthwash.  2.  Intractable nausea: -He was told to take Compazine twice daily.  He will also use Zofran 8 mg ODT as needed.  3.  Diarrhea: - This is the most bothering side effect from his chemo. - Because of the severe side effects we will send his sample for UGT1A1 testing. - I have discontinued Irinotecan with cycle 2.  He took Lomotil 3 times in the last 1 week.  4.  Hypokalemia: -Potassium today is low at 2.9. -He is not taking potassium on a regular basis.  I have reinforced him to take potassium every day. -He will receive potassium by IV and p.o. today.

## 2018-09-07 NOTE — Progress Notes (Signed)
09/07/18  Give Atropine with treatment today due to issues with diarrhea post infusion at home.  T.O. Dr Beckey Downing LPN/Lonna Rabold Ronnald Ramp, PharmD

## 2018-09-07 NOTE — Patient Instructions (Addendum)
[facility] Discharge Instructions for Patients Receiving Chemotherapy   Beginning January 23rd 2017 lab work for the [facility] will be done in the  Main lab at [facility] on 1st floor. If you have a lab appointment with the [facility] please come in thru the  Main Entrance and check in at the main information desk   Today you received the following chemotherapy agents MVASI,Oxaliplatin,Leucovorin and 5FU as well as IV hydration with magnesium and potassium.. Follow-up as scheduled. Call clinic for any questions or concerns  To help prevent nausea and vomiting after your treatment, we encourage you to take your nausea medication   If you develop nausea and vomiting, or diarrhea that is not controlled by your medication, call the clinic.  The clinic phone number is [phone]. Office hours are Monday-Friday 8:30am-5:00pm.  BELOW ARE SYMPTOMS THAT SHOULD BE REPORTED IMMEDIATELY:  *FEVER GREATER THAN 101.0 F  *CHILLS WITH OR WITHOUT FEVER  NAUSEA AND VOMITING THAT IS NOT CONTROLLED WITH YOUR NAUSEA MEDICATION  *UNUSUAL SHORTNESS OF BREATH  *UNUSUAL BRUISING OR BLEEDING  TENDERNESS IN MOUTH AND THROAT WITH OR WITHOUT PRESENCE OF ULCERS  *URINARY PROBLEMS  *BOWEL PROBLEMS  UNUSUAL RASH Items with * indicate a potential emergency and should be followed up as soon as possible. If you have an emergency after office hours please contact your primary care physician or go to the nearest emergency department.  Please call the clinic during office hours if you have any questions or concerns.   You may also contact the Patient Navigator at [phone] should you have any questions or need assistance in obtaining follow up care.      Resources For Cancer Patients and their Caregivers ? American Cancer Society: Can assist with transportation, wigs, general needs, runs Look Good Feel Better.        670-763-1742 ? Cancer Care: Provides financial  assistance, online support groups, medication/co-pay assistance.  1-800-813-HOPE (705) 775-4063) ? [place] Assists [place] cancer patients and their families through emotional , educational and financial support.  319-347-0652 ? [place] DSS Where to apply for food stamps, Medicaid and utility assistance. 918-535-6883 ? [facility]: Transportation to medical appointments. (724)704-9640 ? Social Security Administration: May apply for disability if have a Stage IV cancer. 6817664783 (260)087-7478 ? [employer], Disability and Transit Services: Assists with nutrition, care and transit needs. 6184583194

## 2018-09-08 LAB — MISC LABCORP TEST (SEND OUT)

## 2018-09-09 ENCOUNTER — Other Ambulatory Visit: Payer: Self-pay

## 2018-09-09 ENCOUNTER — Inpatient Hospital Stay (HOSPITAL_COMMUNITY): Payer: 59

## 2018-09-09 ENCOUNTER — Telehealth (HOSPITAL_COMMUNITY): Payer: Self-pay | Admitting: *Deleted

## 2018-09-09 VITALS — BP 116/72 | HR 63 | Temp 96.9°F | Resp 16

## 2018-09-09 DIAGNOSIS — Z95828 Presence of other vascular implants and grafts: Secondary | ICD-10-CM

## 2018-09-09 DIAGNOSIS — C187 Malignant neoplasm of sigmoid colon: Secondary | ICD-10-CM

## 2018-09-09 DIAGNOSIS — D500 Iron deficiency anemia secondary to blood loss (chronic): Secondary | ICD-10-CM

## 2018-09-09 DIAGNOSIS — Z5112 Encounter for antineoplastic immunotherapy: Secondary | ICD-10-CM | POA: Diagnosis not present

## 2018-09-09 MED ORDER — PEGFILGRASTIM INJECTION 6 MG/0.6ML ~~LOC~~
6.0000 mg | PREFILLED_SYRINGE | Freq: Once | SUBCUTANEOUS | Status: AC
  Administered 2018-09-09: 6 mg via SUBCUTANEOUS
  Filled 2018-09-09: qty 0.6

## 2018-09-09 MED ORDER — HEPARIN SOD (PORK) LOCK FLUSH 100 UNIT/ML IV SOLN
500.0000 [IU] | Freq: Once | INTRAVENOUS | Status: AC | PRN
  Administered 2018-09-09: 500 [IU]

## 2018-09-09 MED ORDER — SODIUM CHLORIDE 0.9 % IV SOLN
Freq: Once | INTRAVENOUS | Status: AC
  Administered 2018-09-09: 12:00:00 via INTRAVENOUS
  Filled 2018-09-09: qty 10

## 2018-09-09 MED ORDER — SODIUM CHLORIDE 0.9% FLUSH
10.0000 mL | INTRAVENOUS | Status: DC | PRN
  Administered 2018-09-09: 10 mL
  Filled 2018-09-09: qty 10

## 2018-09-09 NOTE — Progress Notes (Signed)
Fluids and neulasta given today per orders.  Patient tolerated it well without problems. Vitals stable and discharged home from clinic ambulatory. Follow up as scheduled.

## 2018-09-09 NOTE — Progress Notes (Signed)
Upon arrival to the clinic, pt stated that his port needle came out at home around 0400. He put the needle inside of the clear chemo biohazard bag which housed his continuous infusion pump. He did not stop the pump from infusing. The pump continued to infuse. He did not call the clinic upon opening hours or the on-call service to seek further help. Patient arrived with previously stated items in the chemo biohazard bag inside of a food lion bag. No chemo spill occurred in the CC or was visible on the patient. [employer] (ambulatory pump company) was notified and they gave instructions on how to safely ship the ambulatory infusion pump back to them. Chemotherapy and all contaminants were placed in a chemo bin.

## 2018-09-09 NOTE — Telephone Encounter (Signed)
Case Manger Asencion Partridge from [employer] called and needed to get in touch with this patient to see if needed assistance with managing his chemotherapy side effects. Called wife to let her know and give her the contact information for the case manger. Wife verbalized understanding.

## 2018-09-12 ENCOUNTER — Other Ambulatory Visit: Payer: Self-pay

## 2018-09-12 ENCOUNTER — Inpatient Hospital Stay (HOSPITAL_COMMUNITY): Payer: 59 | Attending: Hematology

## 2018-09-12 ENCOUNTER — Inpatient Hospital Stay (HOSPITAL_COMMUNITY): Payer: 59

## 2018-09-12 VITALS — BP 124/77 | HR 73 | Temp 97.8°F | Resp 18 | Wt 195.4 lb

## 2018-09-12 DIAGNOSIS — C187 Malignant neoplasm of sigmoid colon: Secondary | ICD-10-CM

## 2018-09-12 DIAGNOSIS — R197 Diarrhea, unspecified: Secondary | ICD-10-CM | POA: Diagnosis not present

## 2018-09-12 DIAGNOSIS — E876 Hypokalemia: Secondary | ICD-10-CM | POA: Diagnosis present

## 2018-09-12 DIAGNOSIS — R110 Nausea: Secondary | ICD-10-CM | POA: Insufficient documentation

## 2018-09-12 DIAGNOSIS — D500 Iron deficiency anemia secondary to blood loss (chronic): Secondary | ICD-10-CM

## 2018-09-12 DIAGNOSIS — R5383 Other fatigue: Secondary | ICD-10-CM | POA: Diagnosis not present

## 2018-09-12 DIAGNOSIS — C787 Secondary malignant neoplasm of liver and intrahepatic bile duct: Secondary | ICD-10-CM | POA: Diagnosis not present

## 2018-09-12 LAB — COMPREHENSIVE METABOLIC PANEL
ALT: 21 U/L (ref 0–44)
AST: 20 U/L (ref 15–41)
Albumin: 3 g/dL — ABNORMAL LOW (ref 3.5–5.0)
Alkaline Phosphatase: 151 U/L — ABNORMAL HIGH (ref 38–126)
Anion gap: 12 (ref 5–15)
BUN: 16 mg/dL (ref 8–23)
CO2: 24 mmol/L (ref 22–32)
Calcium: 9.2 mg/dL (ref 8.9–10.3)
Chloride: 102 mmol/L (ref 98–111)
Creatinine, Ser: 1.74 mg/dL — ABNORMAL HIGH (ref 0.61–1.24)
GFR calc Af Amer: 48 mL/min — ABNORMAL LOW (ref >60)
GFR calc non Af Amer: 41 mL/min — ABNORMAL LOW (ref >60)
Glucose, Bld: 83 mg/dL (ref 70–99)
Potassium: 4 mmol/L (ref 3.5–5.1)
Sodium: 138 mmol/L (ref 135–145)
Total Bilirubin: 0.8 mg/dL (ref 0.3–1.2)
Total Protein: 6.5 g/dL (ref 6.5–8.1)

## 2018-09-12 LAB — CBC WITH DIFFERENTIAL/PLATELET
Abs Immature Granulocytes: 0.94 10*3/uL — ABNORMAL HIGH (ref 0.00–0.07)
Basophils Absolute: 0.1 10*3/uL (ref 0.0–0.1)
Basophils Relative: 0 %
Eosinophils Absolute: 0.1 10*3/uL (ref 0.0–0.5)
Eosinophils Relative: 0 %
HCT: 35 % — ABNORMAL LOW (ref 39.0–52.0)
Hemoglobin: 11.5 g/dL — ABNORMAL LOW (ref 13.0–17.0)
Immature Granulocytes: 3 %
Lymphocytes Relative: 8 %
Lymphs Abs: 3 10*3/uL (ref 0.7–4.0)
MCH: 28.3 pg (ref 26.0–34.0)
MCHC: 32.9 g/dL (ref 30.0–36.0)
MCV: 86 fL (ref 80.0–100.0)
Monocytes Absolute: 0.8 10*3/uL (ref 0.1–1.0)
Monocytes Relative: 2 %
Neutro Abs: 32.3 10*3/uL — ABNORMAL HIGH (ref 1.7–7.7)
Neutrophils Relative %: 87 %
Platelets: 413 10*3/uL — ABNORMAL HIGH (ref 150–400)
RBC: 4.07 MIL/uL — ABNORMAL LOW (ref 4.22–5.81)
RDW: 13.9 % (ref 11.5–15.5)
WBC: 37.3 10*3/uL — ABNORMAL HIGH (ref 4.0–10.5)
nRBC: 0 % (ref 0.0–0.2)

## 2018-09-12 MED ORDER — SODIUM CHLORIDE 0.9% FLUSH
10.0000 mL | Freq: Once | INTRAVENOUS | Status: AC | PRN
  Administered 2018-09-12: 10 mL

## 2018-09-12 MED ORDER — HEPARIN SOD (PORK) LOCK FLUSH 100 UNIT/ML IV SOLN
500.0000 [IU] | Freq: Once | INTRAVENOUS | Status: AC | PRN
  Administered 2018-09-12: 16:00:00 500 [IU]

## 2018-09-12 MED ORDER — SODIUM CHLORIDE 0.9 % IV SOLN
Freq: Once | INTRAVENOUS | Status: AC
  Administered 2018-09-12: 14:00:00 via INTRAVENOUS
  Filled 2018-09-12: qty 10

## 2018-09-12 NOTE — Progress Notes (Signed)
Pt presents today for fluids. VSS. Pt has no complaints of any changes since the last visit. MAR reviewed and updated. Pt states, " I feel much better than I did last week." Pt states he is taking his K-DUR as prescribed by physician. Pt denies any nausea or diarrhea.   IV fluids given today per MD orders. Tolerated infusion without adverse affects. Vital signs stable. No complaints at this time. Discharged from clinic ambulatory. F/U with [facility] as scheduled.

## 2018-09-12 NOTE — Patient Instructions (Signed)
[facility] at [facility]  Discharge Instructions:   _______________________________________________________________  Thank you for choosing [facility] at [facility] to provide your oncology and hematology care.  To afford each patient quality time with our providers, please arrive at least 15 minutes before your scheduled appointment.  You need to re-schedule your appointment if you arrive 10 or more minutes late.  We strive to give you quality time with our providers, and arriving late affects you and other patients whose appointments are after yours.  Also, if you no show three or more times for appointments you may be dismissed from the clinic.  Again, thank you for choosing [facility] at [facility]. Our hope is that these requests will allow you access to exceptional care and in a timely manner. _______________________________________________________________  If you have questions after your visit, please contact our office at [phone] between the hours of 8:30 a.m. and 5:00 p.m. Voicemails left after 4:30 p.m. will not be returned until the following business day. _______________________________________________________________  For prescription refill requests, have your pharmacy contact our office. _______________________________________________________________  Recommendations made by the consultant and any test results will be sent to your referring physician. _______________________________________________________________ 

## 2018-09-14 ENCOUNTER — Ambulatory Visit (HOSPITAL_COMMUNITY): Payer: 59 | Admitting: Hematology

## 2018-09-14 ENCOUNTER — Other Ambulatory Visit (HOSPITAL_COMMUNITY): Payer: 59

## 2018-09-14 ENCOUNTER — Ambulatory Visit (HOSPITAL_COMMUNITY): Payer: 59

## 2018-09-16 ENCOUNTER — Telehealth (HOSPITAL_COMMUNITY): Payer: Self-pay | Admitting: Hematology

## 2018-09-16 ENCOUNTER — Ambulatory Visit (HOSPITAL_COMMUNITY): Payer: 59

## 2018-09-16 ENCOUNTER — Other Ambulatory Visit: Payer: Self-pay

## 2018-09-16 ENCOUNTER — Ambulatory Visit (HOSPITAL_COMMUNITY)
Admission: RE | Admit: 2018-09-16 | Discharge: 2018-09-16 | Disposition: A | Discharge status: Home / Self Care | Payer: 59 | Source: Ambulatory Visit | Attending: Hematology | Admitting: Hematology

## 2018-09-16 ENCOUNTER — Encounter (HOSPITAL_COMMUNITY): Payer: 59

## 2018-09-16 DIAGNOSIS — C187 Malignant neoplasm of sigmoid colon: Secondary | ICD-10-CM | POA: Diagnosis present

## 2018-09-16 MED ORDER — GADOBUTROL 1 MMOL/ML IV SOLN
10.0000 mL | Freq: Once | INTRAVENOUS | Status: AC | PRN
  Administered 2018-09-16: 12:00:00 10 mL via INTRAVENOUS

## 2018-09-16 NOTE — Telephone Encounter (Signed)
Per Mandy/RN a peer to peer must be done to change neulasta to [facility] and it must be medically necessary to change. Spk with Carol/pharmacist and she stated it was ok to withdrawn request for [place].

## 2018-09-16 NOTE — Telephone Encounter (Signed)
Submitted a new PA request to [facility] to change pts Neulasta to [place]. Case pending [id number]

## 2018-09-21 ENCOUNTER — Encounter (HOSPITAL_COMMUNITY): Payer: Self-pay | Admitting: Hematology

## 2018-09-21 ENCOUNTER — Other Ambulatory Visit: Payer: Self-pay

## 2018-09-21 ENCOUNTER — Inpatient Hospital Stay (HOSPITAL_BASED_OUTPATIENT_CLINIC_OR_DEPARTMENT_OTHER): Payer: 59 | Admitting: Hematology

## 2018-09-21 ENCOUNTER — Ambulatory Visit (HOSPITAL_COMMUNITY)
Admission: RE | Admit: 2018-09-21 | Discharge: 2018-09-21 | Disposition: A | Discharge status: Home / Self Care | Payer: 59 | Source: Ambulatory Visit | Attending: Hematology | Admitting: Hematology

## 2018-09-21 ENCOUNTER — Inpatient Hospital Stay (HOSPITAL_COMMUNITY): Payer: 59

## 2018-09-21 VITALS — BP 103/76 | HR 80 | Temp 97.9°F | Resp 18 | Wt 190.2 lb

## 2018-09-21 VITALS — BP 116/73 | HR 76 | Temp 97.6°F | Resp 18

## 2018-09-21 DIAGNOSIS — D500 Iron deficiency anemia secondary to blood loss (chronic): Secondary | ICD-10-CM

## 2018-09-21 DIAGNOSIS — M7989 Other specified soft tissue disorders: Secondary | ICD-10-CM

## 2018-09-21 DIAGNOSIS — M25661 Stiffness of right knee, not elsewhere classified: Secondary | ICD-10-CM

## 2018-09-21 DIAGNOSIS — C187 Malignant neoplasm of sigmoid colon: Secondary | ICD-10-CM

## 2018-09-21 DIAGNOSIS — E876 Hypokalemia: Secondary | ICD-10-CM

## 2018-09-21 LAB — COMPREHENSIVE METABOLIC PANEL WITH GFR
ALT: 16 U/L (ref 0–44)
AST: 16 U/L (ref 15–41)
Albumin: 2.8 g/dL — ABNORMAL LOW (ref 3.5–5.0)
Alkaline Phosphatase: 77 U/L (ref 38–126)
Anion gap: 13 (ref 5–15)
BUN: 15 mg/dL (ref 8–23)
CO2: 21 mmol/L — ABNORMAL LOW (ref 22–32)
Calcium: 9.1 mg/dL (ref 8.9–10.3)
Chloride: 102 mmol/L (ref 98–111)
Creatinine, Ser: 1.6 mg/dL — ABNORMAL HIGH (ref 0.61–1.24)
GFR calc Af Amer: 53 mL/min — ABNORMAL LOW
GFR calc non Af Amer: 45 mL/min — ABNORMAL LOW
Glucose, Bld: 111 mg/dL — ABNORMAL HIGH (ref 70–99)
Potassium: 2.7 mmol/L — CL (ref 3.5–5.1)
Sodium: 136 mmol/L (ref 135–145)
Total Bilirubin: 1.1 mg/dL (ref 0.3–1.2)
Total Protein: 6.2 g/dL — ABNORMAL LOW (ref 6.5–8.1)

## 2018-09-21 LAB — CBC WITH DIFFERENTIAL/PLATELET
Abs Immature Granulocytes: 0.09 10*3/uL — ABNORMAL HIGH (ref 0.00–0.07)
Basophils Absolute: 0 10*3/uL (ref 0.0–0.1)
Basophils Relative: 0 %
Eosinophils Absolute: 0.1 10*3/uL (ref 0.0–0.5)
Eosinophils Relative: 1 %
HCT: 30.4 % — ABNORMAL LOW (ref 39.0–52.0)
Hemoglobin: 9.9 g/dL — ABNORMAL LOW (ref 13.0–17.0)
Immature Granulocytes: 1 %
Lymphocytes Relative: 22 %
Lymphs Abs: 2.1 10*3/uL (ref 0.7–4.0)
MCH: 28.1 pg (ref 26.0–34.0)
MCHC: 32.6 g/dL (ref 30.0–36.0)
MCV: 86.4 fL (ref 80.0–100.0)
Monocytes Absolute: 0.8 10*3/uL (ref 0.1–1.0)
Monocytes Relative: 9 %
Neutro Abs: 6.1 10*3/uL (ref 1.7–7.7)
Neutrophils Relative %: 67 %
Platelets: 307 10*3/uL (ref 150–400)
RBC: 3.52 MIL/uL — ABNORMAL LOW (ref 4.22–5.81)
RDW: 14.7 % (ref 11.5–15.5)
WBC: 9.3 10*3/uL (ref 4.0–10.5)
nRBC: 0 % (ref 0.0–0.2)

## 2018-09-21 LAB — URIC ACID: Uric Acid, Serum: 10.8 mg/dL — ABNORMAL HIGH (ref 3.7–8.6)

## 2018-09-21 MED ORDER — POTASSIUM CHLORIDE CRYS ER 20 MEQ PO TBCR
40.0000 meq | EXTENDED_RELEASE_TABLET | ORAL | Status: AC
  Administered 2018-09-21 (×2): 40 meq via ORAL
  Filled 2018-09-21 (×2): qty 2

## 2018-09-21 MED ORDER — COLCHICINE 0.6 MG PO TABS: 0.6000 mg | ORAL_TABLET | Freq: Two times a day (BID) | ORAL | 0 refills | Status: DC

## 2018-09-21 MED ORDER — METHYLPREDNISOLONE 4 MG PO TBPK: ORAL_TABLET | ORAL | 0 refills | Status: DC

## 2018-09-21 MED ORDER — SODIUM CHLORIDE 0.9% FLUSH
10.0000 mL | Freq: Once | INTRAVENOUS | Status: AC | PRN
  Administered 2018-09-21: 10 mL

## 2018-09-21 MED ORDER — ALLOPURINOL 300 MG PO TABS: 300.0000 mg | ORAL_TABLET | Freq: Every day | ORAL | 3 refills | Status: DC

## 2018-09-21 MED ORDER — HEPARIN SOD (PORK) LOCK FLUSH 100 UNIT/ML IV SOLN
500.0000 [IU] | Freq: Once | INTRAVENOUS | Status: AC | PRN
  Administered 2018-09-21: 13:00:00 500 [IU]

## 2018-09-21 MED ORDER — SODIUM CHLORIDE 0.9 % IV SOLN
Freq: Once | INTRAVENOUS | Status: AC
  Administered 2018-09-21: 11:00:00 via INTRAVENOUS
  Filled 2018-09-21: qty 10

## 2018-09-21 NOTE — Patient Instructions (Signed)
[place] at [facility]  Discharge Instructions:  Three new prescriptions were called into your pharmacy. Start taking your potassium tabs twice a day. _______________________________________________________________  Thank you for choosing [place] at [facility] to provide your oncology and hematology care.  To afford each patient quality time with our providers, please arrive at least 15 minutes before your scheduled appointment.  You need to re-schedule your appointment if you arrive 10 or more minutes late.  We strive to give you quality time with our providers, and arriving late affects you and other patients whose appointments are after yours.  Also, if you no show three or more times for appointments you may be dismissed from the clinic.  Again, thank you for choosing [place] at [place] hope is that these requests will allow you access to exceptional care and in a timely manner. _______________________________________________________________  If you have questions after your visit, please contact our office at [phone] between the hours of 8:30 a.m. and 5:00 p.m. Voicemails left after 4:30 p.m. will not be returned until the following business day. _______________________________________________________________  For prescription refill requests, have your pharmacy contact our office. _______________________________________________________________  Recommendations made by the consultant and any test results will be sent to your referring physician. _______________________________________________________________

## 2018-09-21 NOTE — Progress Notes (Signed)
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 Focal narrowing and wall thickening of the sigmoid colon is noted in this area most likely due to inflammation, but neoplasm cannot be excluded, and sigmoidoscopy is recommended for further evaluation.   03/20/2016 Procedure   Colonoscopy by Dr. Laural Golden- Malignant partially obstructing tumor in the distal sigmoid colon. Biopsied. - Incomplete exam.   03/23/2016 Pathology Results   Colon, biopsy, sigmoid mass - ADENOCARCINOMA.   03/23/2016 Procedure   Low anterior resection with transanal anastomosis   03/26/2016 Pathology Results   Colon, segmental resection for tumor, sigmoid - INVASIVE WELL DIFFERENTIATED ADENOCARCINOMA, SPANNING 5 CM IN GREATEST DIMENSION. - TUMOR INVADES THROUGH MUSCULARIS PROPRIA TO INVOLVE SUBSEROSAL SOFT TISSUES. - UNDERLYING MARKED ACUTE AND CHRONIC INFLAMMATION WITH GIANT CELL REACTION AND ABSCESS FORMATION EXTENDING TO THE SEROSAL SURFACE AND INVOLVING THE MESENTERIC TISSUE. - MARGINS ARE NEGATIVE FOR TUMOR. - FOURTEEN BENIGN LYMPH NODES WHICH DEMONSTRATE VARIABLE INVOLVEMENT WITH ACUTE AND CHRONIC INFLAMMATION WITH ASSOCIATED GIANT CELLS WITHOUT TUMOR IDENTIFIED (0/14).   04/08/2016 Cancer Staging   Cancer Staging Adenocarcinoma of sigmoid colon [facility]) Staging form: Colon and Rectum, AJCC 8th Edition - Pathologic stage from 04/01/2016: Stage IIA (pT3, pN0, cM0) - Signed by Baird Cancer, PA-C on 04/01/2016    07/09/2016 Imaging   CT abd/pelvis- No evidence of metastatic disease or  other acute findings.  Bilateral nonobstructing renal calculi and mild bilateral renal parenchymal scarring.  Stable mildly enlarged prostate gland and findings of chronic bladder outlet obstruction.   08/16/2018 -  Chemotherapy   The patient had palonosetron (ALOXI) injection 0.25 mg, 0.25 mg, Intravenous,  Once, 2 of 6 cycles Administration: 0.25 mg (08/16/2018), 0.25 mg (09/07/2018) pegfilgrastim (NEULASTA) injection 6 mg, 6 mg, Subcutaneous, Once, 2 of 6 cycles Administration: 6 mg (08/18/2018), 6 mg (09/09/2018) irinotecan (CAMPTOSAR) 380 mg in sodium chloride 0.9 % 500 mL chemo infusion, 165 mg/m2 = 380 mg, Intravenous,  Once, 1 of 5 cycles Administration: 380 mg (08/16/2018) leucovorin 900 mg in dextrose 5 % 250 mL infusion, 400 mg/m2 = 900 mg, Intravenous,  Once, 2 of 6 cycles Administration: 900 mg (08/16/2018), 900 mg (09/07/2018) oxaliplatin (ELOXATIN) 190 mg in dextrose 5 % 500 mL chemo infusion, 85 mg/m2 = 190 mg, Intravenous,  Once, 2 of 6 cycles Dose modification: 68 mg/m2 (80 % of original dose 85 mg/m2, Cycle 2, Reason: Provider Judgment) Administration: 190 mg (08/16/2018), 150 mg (09/07/2018) fosaprepitant (EMEND) 150 mg, dexamethasone (DECADRON) 12 mg in sodium chloride 0.9 % 145 mL IVPB, , Intravenous,  Once, 2 of 6 cycles Administration:  (08/16/2018),  (09/07/2018) fluorouracil (ADRUCIL) 5,400 mg in sodium chloride 0.9 % 142 mL chemo infusion, 2,400 mg/m2 = 5,400 mg, Intravenous, 1 Day/Dose, 2 of 6 cycles Administration: 5,400 mg (08/16/2018), 5,400 mg (09/07/2018) bevacizumab-awwb (MVASI) 500 mg in sodium chloride 0.9 % 100 mL chemo infusion, 5 mg/kg = 500 mg (100 % of original dose 5 mg/kg), Intravenous,  Once, 2 of 6 cycles Dose modification: 5 mg/kg (original dose 5 mg/kg, Cycle 1), 5 mg/kg (original dose 5 mg/kg, Cycle 2) Administration: 500 mg (08/16/2018), 500  mg (09/07/2018)  for chemotherapy treatment.       CANCER STAGING: Cancer Staging Adenocarcinoma of sigmoid colon [facility])  Staging form: Colon and Rectum, AJCC 8th Edition - Pathologic stage from 04/01/2016: Stage IIA (pT3, pN0, cM0) - Signed by Baird Cancer, PA-C on 04/01/2016    INTERVAL HISTORY:  Troy Smith 63 y.o. male seen for cycle 3 of chemotherapy.  Cycle 2 was FOLFOX on 09/07/2018.  He had severe weakness and decrease in appetite.  He lost more weight.  He complained of pain in the right knee and right middle finger medial joint for the last 2 days.  Denies any fevers or chills.  On questioning he reports he had a history of gout with the right great toe many years ago.  Denies any tingling or numbness.  However had diarrhea which was watery.  No nausea or vomiting was reported.    REVIEW OF SYSTEMS:  Review of Systems  Constitutional: Positive for fatigue and unexpected weight change. Negative for appetite change.  Gastrointestinal: Positive for diarrhea.  Musculoskeletal: Positive for arthralgias.  All other systems reviewed and are negative.    PAST MEDICAL/SURGICAL HISTORY:  Past Medical History:  Diagnosis Date  . 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 Carpal tunnel syndrome 05/02/2014   Bilateral  . Cervical disc disorder with radiculopathy of cervical region 05/02/2014   Left C8  . Diabetes mellitus without complication ([place])   . Hypertension   . Port-A-Cath in place 08/10/2018   Past Surgical History:  Procedure Laterality Date  . BIOPSY  03/20/2016   Procedure: BIOPSY;  Surgeon: Rogene Houston, MD;  Location[facility] END[facility];  Service: Endoscopy;;  colon  . BOWEL RESECTION N/A 03/23/2016   Procedure: LOW ANTERIOR RESECTION;  Surgeon: Aviva Signs, MD;  Location: [facility] ORS;  Service: General;  Laterality: N/A;  . COLONOSCOPY N/A 03/20/2016   Procedure: COLONOSCOPY;  Surgeon: Rogene Houston, MD;  Location: [facility] ENDO SUITE;  Service: Endoscopy;  Laterality: N/A;  730  . COLONOSCOPY N/A 07/10/2016   Procedure: COLONOSCOPY;  Surgeon: Rogene Houston, MD;  Location: [facility] END[facility];   Service: Endoscopy;  Laterality: N/A;  1040  . NO PAST SURGERIES    . POLYPECTOMY  07/10/2016   Procedure: POLYPECTOMY;  Surgeon: Rogene Houston, MD;  Location: [facility] END[facility];  Service: Endoscopy;;  colon  . PORTACATH PLACEMENT Left 08/05/2018   Procedure: INSERTION PORT-A-CATH;  Surgeon: Aviva Signs, MD;  Location: [facility] ORS;  Service: General;  Laterality: Left;  . TONSILLECTOMY       SOCIAL HISTORY:  Social History   Socioeconomic History  . Marital status: Married    Spouse name: Not on file  . Number of children: Not on file  . Years of education: Not on file  . Highest education level: Not on file  Occupational History  . Not on file  Social Needs  . Financial resource strain: Not on file  . Food insecurity    Worry: Not on file    Inability: Not on file  . Transportation needs    Medical: Not on file    Non-medical: Not on file  Tobacco Use  . Smoking status: Never Smoker  . Smokeless tobacco: Never Used  Substance and Sexual Activity  . Alcohol use: Yes    Comment: rarely  . Drug use: No  . Sexual activity: Not on file  Lifestyle  . Physical activity    Days per week: Not on file  Minutes per session: Not on file  . Stress: Not on file  Relationships  . Social Herbalist on phone: Not on file    Gets together: Not on file    Attends religious service: Not on file    Active member of club or organization: Not on file    Attends meetings of clubs or organizations: Not on file    Relationship status: Not on file  . Intimate partner violence    Fear of current or ex partner: Not on file    Emotionally abused: Not on file    Physically abused: Not on file    Forced sexual activity: Not on file  Other Topics Concern  . Not on file  Social History Narrative  . Not on file    FAMILY HISTORY:  Family History  Problem Relation Age of Onset  . COPD Mother   . Melanoma Father     CURRENT MEDICATIONS:  Outpatient Encounter Medications as of  09/21/2018  Medication Sig  . amLODipine (NORVASC) 10 MG tablet Take 10 mg by mouth daily.  . Bevacizumab (AVASTIN IV) Inject into the vein every 14 (fourteen) days.  Marland Kitchen dextrose 5 % SOLN 1,000 mL with fluorouracil 5 GM/100ML SOLN Inject into the vein over 48 hr.  . diphenoxylate-atropine (LOMOTIL) 2.5-0.025 MG tablet Take 1 tablet by mouth 4 (four) times daily as needed for diarrhea or loose stools.  Marland Kitchen FLUOROURACIL IV Inject into the vein every 14 (fourteen) days.  . hydrochlorothiazide (HYDRODIURIL) 25 MG tablet Take 25 mg by mouth daily.   Marland Kitchen HYDROcodone-acetaminophen (NORCO) 5-325 MG tablet Take 1 tablet by mouth every 4 (four) hours as needed for moderate pain.  Marland Kitchen insulin glargine (LANTUS) 100 UNIT/ML injection Inject 30 Units into the skin at bedtime.   . IRINOTECAN HCL IV Inject into the vein every 14 (fourteen) days.  Marland Kitchen LEUCOVORIN CALCIUM IV Inject into the vein every 14 (fourteen) days.  Marland Kitchen lidocaine-prilocaine (EMLA) cream Apply a small amount to port a cath site and cover with plastic wrap 1 hour prior to chemotherapy appointments  . loperamide (IMODIUM A-D) 2 MG tablet Take 2 at onset of diarrhea, then 1 after each watery bowel movement.  . magic mouthwash w/lidocaine SOLN Take 5 mLs by mouth 4 (four) times daily as needed for mouth pain.  . metoprolol succinate (TOPROL-XL) 50 MG 24 hr tablet Take 50 mg by mouth daily. Take with or immediately following a meal.  . ondansetron (ZOFRAN ODT) 8 MG disintegrating tablet Take 1 tablet (8 mg total) by mouth every 8 (eight) hours as needed for nausea or vomiting.  . OXALIPLATIN IV Inject into the vein every 14 (fourteen) days.  . potassium chloride SA (K-DUR) 20 MEQ tablet Take 1 tablet (20 mEq total) by mouth daily.  . prochlorperazine (COMPAZINE) 10 MG tablet Take 1 tablet (10 mg total) by mouth every 6 (six) hours as needed (Nausea or vomiting).  Marland Kitchen scopolamine (TRANSDERM-SCOP) 1 MG/3DAYS Place 1 patch (1.5 mg total) onto the skin every 3  (three) days.  Marland Kitchen allopurinol (ZYLOPRIM) 300 MG tablet Take 1 tablet (300 mg total) by mouth daily.  . colchicine 0.6 MG tablet Take 1 tablet (0.6 mg total) by mouth 2 (two) times daily.  . methylPREDNISolone (MEDROL DOSEPAK) 4 MG TBPK tablet Take as directed   No facility-administered encounter medications on file as of 09/21/2018.     ALLERGIES:  Allergies  Allergen Reactions  . Penicillin G Nausea And Vomiting  PHYSICAL EXAM:  ECOG Performance status: 1  Vital signs: Blood pressure is 103/76 pulse rate is 84.  Respiratory is 18.  Temperature 98.1.  Oxygen saturations are 100.  Physical Exam Cardiovascular:     Rate and Rhythm: Normal rate and regular rhythm.     Heart sounds: Normal heart sounds.  Pulmonary:     Effort: Pulmonary effort is normal.     Breath sounds: Normal breath sounds.  Abdominal:     General: There is no distension.     Palpations: Abdomen is soft. There is no mass.  Musculoskeletal: Normal range of motion.  Skin:    General: Skin is warm and dry.  Neurological:     Mental Status: He is alert and oriented to person, place, and time. Mental status is at baseline.  Psychiatric:        Mood and Affect: Mood normal.        Behavior: Behavior normal.        Thought Content: Thought content normal.        Judgment: Judgment normal.   Swollen right middle finger mid joint.  Swollen right knee.  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   LABORATORY DATA:  I have reviewed the labs as listed.  CBC    Component Value Date/Time   WBC 9.3 09/21/2018 0844   RBC 3.52 (L) 09/21/2018 0844   HGB 9.9 (L) 09/21/2018 0844   HCT 30.4 (L) 09/21/2018 0844   PLT 307 09/21/2018 0844   MCV 86.4 09/21/2018 0844   MCH 28.1 09/21/2018 0844   MCHC 32.6 09/21/2018 0844   RDW 14.7 09/21/2018 0844   LYMPHSABS 2.1 09/21/2018 0844   MONOABS 0.8 09/21/2018 0844   EOSABS 0.1 09/21/2018 0844   BASOSABS 0.0 09/21/2018 0844   CMP Latest Ref Rng & Units  09/21/2018 09/12/2018 09/07/2018  Glucose 70 - 99 mg/dL 111(H) 83 173(H)  BUN 8 - 23 mg/dL 15 16 16   Creatinine 0.61 - 1.24 mg/dL 1.60(H) 1.74(H) 2.18(H)  Sodium 135 - 145 mmol/L 136 138 133(L)  Potassium 3.5 - 5.1 mmol/L 2.7(LL) 4.0 2.9(L)  Chloride 98 - 111 mmol/L 102 102 96(L)  CO2 22 - 32 mmol/L 21(L) 24 25  Calcium 8.9 - 10.3 mg/dL 9.1 9.2 9.2  Total Protein 6.5 - 8.1 g/dL 6.2(L) 6.5 6.3(L)  Total Bilirubin 0.3 - 1.2 mg/dL 1.1 0.8 0.9  Alkaline Phos 38 - 126 U/L 77 151(H) 73  AST 15 - 41 U/L 16 20 16   ALT 0 - 44 U/L 16 21 21      I have independently reviewed scans and discussed with the patient.  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  Metastatic sigmoid colon cancer to the liver: -Foundation 1 shows MS-stable, RAS-wild-type -Cycle 1 of FOLFOXIRI and Bevacizumab on 08/16/2018. -He has tolerated it very poorly with severe nausea and diarrhea and worsening of renal function and requiring multiple IV infusions of fluids. 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 - Cycle 2 of FOLFOX on 09/07/2018 with oxaliplatin dose reduced by 20%. - He had watery diarrhea.  He also had nausea and some soreness in the mouth. -He did have severely decreased energy and appetite.  He has lost more weight. -I will hold off on his treatment today.  2.  Hypokalemia: -Potassium is low at 2.7 today. - We have repleted potassium today.  I have reiterated to him that he should  take potassium supplements at home.  He is on 20 mEq daily.  3.  Nausea: - He is on Compazine and Zofran ODT.  4.  Acute gout flare: - He had a history of gout in the right great toe. -However he complained of swollen right middle finger PIP joint and right knee for the past few days. -I have checked a uric acid which was about 10.  I have done x-rays in the office and reviewed them.  No clear fracture seen.  Soft tissue swelling was present. -I  have given prescription for colchicine to be taken 0.6 mg twice daily.  He was also given allopurinol 300 mg daily.  He was also given Medrol Dosepak for acute control of symptoms.  4.  Diarrhea: - He had severe diarrhea after first cycle.  Diarrhea is better after cycle 2. -He will take Lomotil as needed.   Total time spent is 40 minutes with more than 50% of the time spent face-to-face discussing scan results, lab results, treatment plan, counseling and coordination of care.  Orders placed this encounter:  Orders Placed This Encounter  Procedures  . DG Finger Middle Right  . DG Knee Complete 4 Views Right  . Uric acid     Derek Jack, MD [place] [phone]

## 2018-09-21 NOTE — Patient Instructions (Addendum)
[facility] at [facility] Discharge Instructions  You were seen today by Dr. Katragadda. He went over your recent lab results. He will see you back in  for labs and follow up.   Thank you for choosing [facility] at [facility] to provide your oncology and hematology care.  To afford each patient quality time with our provider, please arrive at least 15 minutes before your scheduled appointment time.   If you have a lab appointment with the Cancer Center please come in thru the  Main Entrance and check in at the main information desk  You need to re-schedule your appointment should you arrive 10 or more minutes late.  We strive to give you quality time with our providers, and arriving late affects you and other patients whose appointments are after yours.  Also, if you no show three or more times for appointments you may be dismissed from the clinic at the providers discretion.     Again, thank you for choosing [facility].  Our hope is that these requests will decrease the amount of time that you wait before being seen by our physicians.       _____________________________________________________________  Should you have questions after your visit to [facility], please contact our office at [phone] between the hours of 8:00 a.m. and 4:30 p.m.  Voicemails left after 4:00 p.m. will not be returned until the following business day.  For prescription refill requests, have your pharmacy contact our office and allow 72 hours.    Cancer Center Support Programs:   > Cancer Support Group  2nd Tuesday of the month 1pm-2pm, Journey Room    

## 2018-09-21 NOTE — Progress Notes (Signed)
Patient to treatment room for oncology follow up and chemotherapy.  Patient having knee pain and swollen finger joints on right hand.  Having difficulty standing and walking with right knee per patient.  Uric acid added to labs with knee xrays today verbal order Dr. Delton Coombes.  No s/s of distress noted.  Family at side.    No treatment today verbal order Dr Delton Coombes.  Patient to receive potassium by mouth and hydration verbal order Dr. Delton Coombes.    Patient tolerated hydration with no complaints voiced.  Dr. Delton Coombes in to see the patient to review labs and xrays.  Patient instructed to pick up new prescriptions and next appointment times with understanding verbalized.  Copy given for home.  Port site clean and dry with no bruising or swelling noted.  Good blood return noted before and after hydration.  Band aid applied.  VSs with discharge and left by wheelchair with no s/s of distress noted.

## 2018-09-21 NOTE — Progress Notes (Signed)
Critical value **  Potassium 2.7  Dr. Delton Coombes aware, no orders received, he will update the treating RN during visit.

## 2018-09-22 ENCOUNTER — Encounter (HOSPITAL_COMMUNITY): Payer: Self-pay | Admitting: Hematology

## 2018-09-22 NOTE — Assessment & Plan Note (Addendum)
1.  Metastatic sigmoid colon cancer to the liver: -Foundation 1 shows MS-stable, RAS-wild-type -Cycle 1 of FOLFOXIRI and Bevacizumab on 08/16/2018. -He has tolerated it very poorly with severe nausea and diarrhea and worsening of renal function and requiring multiple IV infusions of fluids. 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 - Cycle 2 of FOLFOX on 09/07/2018 with oxaliplatin dose reduced by 20%. - He had watery diarrhea.  He also had nausea and some soreness in the mouth. -He did have severely decreased energy and appetite.  He has lost more weight. -I will hold off on his treatment today.  2.  Hypokalemia: -Potassium is low at 2.7 today. - We have repleted potassium today.  I have reiterated to him that he should take potassium supplements at home.  He is on 20 mEq daily.  3.  Nausea: - He is on Compazine and Zofran ODT.  4.  Acute gout flare: - He had a history of gout in the right great toe. -However he complained of swollen right middle finger PIP joint and right knee for the past few days. -I have checked a uric acid which was about 10.  I have done x-rays in the office and reviewed them.  No clear fracture seen.  Soft tissue swelling was present. -I have given prescription for colchicine to be taken 0.6 mg twice daily.  He was also given allopurinol 300 mg daily.  He was also given Medrol Dosepak for acute control of symptoms.  4.  Diarrhea: - He had severe diarrhea after first cycle.  Diarrhea is better after cycle 2. -He will take Lomotil as needed.

## 2018-09-23 ENCOUNTER — Ambulatory Visit (HOSPITAL_COMMUNITY): Payer: 59

## 2018-09-27 ENCOUNTER — Other Ambulatory Visit (HOSPITAL_COMMUNITY): Payer: Self-pay | Admitting: Nurse Practitioner

## 2018-09-27 DIAGNOSIS — M25661 Stiffness of right knee, not elsewhere classified: Secondary | ICD-10-CM

## 2018-09-27 DIAGNOSIS — M7989 Other specified soft tissue disorders: Secondary | ICD-10-CM

## 2018-09-27 MED ORDER — COLCHICINE 0.6 MG PO TABS: 0.3000 mg | ORAL_TABLET | Freq: Two times a day (BID) | ORAL | 0 refills | Status: DC

## 2018-09-28 ENCOUNTER — Ambulatory Visit (HOSPITAL_COMMUNITY): Payer: 59 | Admitting: Hematology

## 2018-09-28 ENCOUNTER — Other Ambulatory Visit (HOSPITAL_COMMUNITY): Payer: 59

## 2018-09-28 ENCOUNTER — Ambulatory Visit (HOSPITAL_COMMUNITY): Payer: 59

## 2018-09-30 ENCOUNTER — Encounter (HOSPITAL_COMMUNITY): Payer: 59

## 2018-09-30 ENCOUNTER — Ambulatory Visit (HOSPITAL_COMMUNITY): Payer: 59

## 2018-10-17 ENCOUNTER — Encounter (HOSPITAL_COMMUNITY): Payer: Self-pay

## 2018-10-19 ENCOUNTER — Encounter (HOSPITAL_COMMUNITY): Payer: Self-pay | Admitting: Hematology

## 2018-10-19 ENCOUNTER — Inpatient Hospital Stay (HOSPITAL_COMMUNITY): Payer: 59

## 2018-10-19 ENCOUNTER — Inpatient Hospital Stay (HOSPITAL_BASED_OUTPATIENT_CLINIC_OR_DEPARTMENT_OTHER): Payer: 59 | Admitting: Hematology

## 2018-10-19 ENCOUNTER — Inpatient Hospital Stay (HOSPITAL_COMMUNITY): Payer: 59 | Attending: Hematology

## 2018-10-19 ENCOUNTER — Other Ambulatory Visit: Payer: Self-pay

## 2018-10-19 ENCOUNTER — Other Ambulatory Visit (HOSPITAL_COMMUNITY): Payer: Self-pay | Admitting: *Deleted

## 2018-10-19 VITALS — BP 116/82 | HR 78 | Temp 98.5°F | Resp 18

## 2018-10-19 VITALS — BP 124/98 | HR 81 | Temp 97.9°F | Resp 18 | Wt 196.5 lb

## 2018-10-19 DIAGNOSIS — C187 Malignant neoplasm of sigmoid colon: Secondary | ICD-10-CM

## 2018-10-19 DIAGNOSIS — N189 Chronic kidney disease, unspecified: Secondary | ICD-10-CM | POA: Insufficient documentation

## 2018-10-19 DIAGNOSIS — Z95828 Presence of other vascular implants and grafts: Secondary | ICD-10-CM

## 2018-10-19 DIAGNOSIS — F09 Unspecified mental disorder due to known physiological condition: Secondary | ICD-10-CM

## 2018-10-19 DIAGNOSIS — C787 Secondary malignant neoplasm of liver and intrahepatic bile duct: Secondary | ICD-10-CM | POA: Diagnosis not present

## 2018-10-19 DIAGNOSIS — Z23 Encounter for immunization: Secondary | ICD-10-CM | POA: Diagnosis not present

## 2018-10-19 DIAGNOSIS — Z5111 Encounter for antineoplastic chemotherapy: Secondary | ICD-10-CM | POA: Diagnosis present

## 2018-10-19 DIAGNOSIS — Z5112 Encounter for antineoplastic immunotherapy: Secondary | ICD-10-CM | POA: Insufficient documentation

## 2018-10-19 DIAGNOSIS — Z5189 Encounter for other specified aftercare: Secondary | ICD-10-CM | POA: Diagnosis not present

## 2018-10-19 DIAGNOSIS — M109 Gout, unspecified: Secondary | ICD-10-CM | POA: Insufficient documentation

## 2018-10-19 LAB — CBC WITH DIFFERENTIAL/PLATELET
Abs Immature Granulocytes: 0.01 10*3/uL (ref 0.00–0.07)
Basophils Absolute: 0 10*3/uL (ref 0.0–0.1)
Basophils Relative: 1 %
Eosinophils Absolute: 0.2 10*3/uL (ref 0.0–0.5)
Eosinophils Relative: 5 %
HCT: 32 % — ABNORMAL LOW (ref 39.0–52.0)
Hemoglobin: 10 g/dL — ABNORMAL LOW (ref 13.0–17.0)
Immature Granulocytes: 0 %
Lymphocytes Relative: 30 %
Lymphs Abs: 1.6 10*3/uL (ref 0.7–4.0)
MCH: 28.5 pg (ref 26.0–34.0)
MCHC: 31.3 g/dL (ref 30.0–36.0)
MCV: 91.2 fL (ref 80.0–100.0)
Monocytes Absolute: 0.3 10*3/uL (ref 0.1–1.0)
Monocytes Relative: 5 %
Neutro Abs: 3 10*3/uL (ref 1.7–7.7)
Neutrophils Relative %: 59 %
Platelets: 175 10*3/uL (ref 150–400)
RBC: 3.51 MIL/uL — ABNORMAL LOW (ref 4.22–5.81)
RDW: 15.1 % (ref 11.5–15.5)
WBC: 5.3 10*3/uL (ref 4.0–10.5)
nRBC: 0 % (ref 0.0–0.2)

## 2018-10-19 LAB — COMPREHENSIVE METABOLIC PANEL
ALT: 16 U/L (ref 0–44)
AST: 14 U/L — ABNORMAL LOW (ref 15–41)
Albumin: 3.4 g/dL — ABNORMAL LOW (ref 3.5–5.0)
Alkaline Phosphatase: 67 U/L (ref 38–126)
Anion gap: 7 (ref 5–15)
BUN: 18 mg/dL (ref 8–23)
CO2: 22 mmol/L (ref 22–32)
Calcium: 8.8 mg/dL — ABNORMAL LOW (ref 8.9–10.3)
Chloride: 107 mmol/L (ref 98–111)
Creatinine, Ser: 1.12 mg/dL (ref 0.61–1.24)
GFR calc Af Amer: >60 mL/min (ref >60)
GFR calc non Af Amer: >60 mL/min (ref >60)
Glucose, Bld: 160 mg/dL — ABNORMAL HIGH (ref 70–99)
Potassium: 3.6 mmol/L (ref 3.5–5.1)
Sodium: 136 mmol/L (ref 135–145)
Total Bilirubin: 0.9 mg/dL (ref 0.3–1.2)
Total Protein: 5.8 g/dL — ABNORMAL LOW (ref 6.5–8.1)

## 2018-10-19 LAB — MAGNESIUM: Magnesium: 1.8 mg/dL (ref 1.7–2.4)

## 2018-10-19 LAB — URINALYSIS, DIPSTICK ONLY
Bilirubin Urine: NEGATIVE
Glucose, UA: NEGATIVE mg/dL
Hgb urine dipstick: NEGATIVE
Ketones, ur: NEGATIVE mg/dL
Leukocytes,Ua: NEGATIVE
Nitrite: NEGATIVE
Protein, ur: NEGATIVE mg/dL
Specific Gravity, Urine: 1.012 (ref 1.005–1.030)
pH: 5 (ref 5.0–8.0)

## 2018-10-19 MED ORDER — DEXTROSE 5 % IV SOLN
Freq: Once | INTRAVENOUS | Status: AC
  Administered 2018-10-19: 13:00:00 via INTRAVENOUS

## 2018-10-19 MED ORDER — ATROPINE SULFATE 1 MG/ML IJ SOLN
0.5000 mg | Freq: Once | INTRAMUSCULAR | Status: AC | PRN
  Administered 2018-10-19: 0.5 mg via INTRAVENOUS
  Filled 2018-10-19: qty 1

## 2018-10-19 MED ORDER — PALONOSETRON HCL INJECTION 0.25 MG/5ML
0.2500 mg | Freq: Once | INTRAVENOUS | Status: AC
  Administered 2018-10-19: 0.25 mg via INTRAVENOUS

## 2018-10-19 MED ORDER — LEUCOVORIN CALCIUM INJECTION 350 MG
400.0000 mg/m2 | Freq: Once | INTRAVENOUS | Status: AC
  Administered 2018-10-19: 900 mg via INTRAVENOUS
  Filled 2018-10-19: qty 10

## 2018-10-19 MED ORDER — OXALIPLATIN CHEMO INJECTION 100 MG/20ML: 68.0000 mg/m2 | Freq: Once | INTRAVENOUS | Status: DC

## 2018-10-19 MED ORDER — SODIUM CHLORIDE 0.9 % IV SOLN
1920.0000 mg/m2 | INTRAVENOUS | Status: DC
  Administered 2018-10-19: 17:00:00 4300 mg via INTRAVENOUS
  Filled 2018-10-19: qty 86

## 2018-10-19 MED ORDER — SODIUM CHLORIDE 0.9 % IV SOLN
5.0000 mg/kg | Freq: Once | INTRAVENOUS | Status: AC
  Administered 2018-10-19: 500 mg via INTRAVENOUS
  Filled 2018-10-19: qty 4

## 2018-10-19 MED ORDER — SODIUM CHLORIDE 0.9% FLUSH
10.0000 mL | INTRAVENOUS | Status: DC | PRN
  Administered 2018-10-19: 10 mL
  Filled 2018-10-19: qty 10

## 2018-10-19 MED ORDER — SODIUM CHLORIDE 0.9 % IV SOLN
Freq: Once | INTRAVENOUS | Status: AC
  Administered 2018-10-19: 12:00:00 via INTRAVENOUS
  Filled 2018-10-19: qty 5

## 2018-10-19 MED ORDER — SODIUM CHLORIDE 0.9 % IV SOLN
INTRAVENOUS | Status: DC
  Administered 2018-10-19: 11:00:00 via INTRAVENOUS

## 2018-10-19 MED ORDER — OXALIPLATIN CHEMO INJECTION 100 MG/20ML
66.0000 mg/m2 | Freq: Once | INTRAVENOUS | Status: AC
  Administered 2018-10-19: 150 mg via INTRAVENOUS
  Filled 2018-10-19: qty 20

## 2018-10-19 MED ORDER — DONEPEZIL HCL 5 MG PO TABS: 5.0000 mg | ORAL_TABLET | Freq: Every day | ORAL | 0 refills | Status: DC

## 2018-10-19 MED ORDER — PALONOSETRON HCL INJECTION 0.25 MG/5ML
INTRAVENOUS | Status: AC
  Filled 2018-10-19: qty 5

## 2018-10-19 NOTE — Progress Notes (Signed)
Keep Mvasi dose at 500mg  (5 mg/kg) today per Dr. Cranston Neighbor, PharmD, [facility] Phone: [phone] 10/19/2018

## 2018-10-19 NOTE — Assessment & Plan Note (Addendum)
1.  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 - Cycle 1 of FOLFOX ED and bevacizumab on 08/16/2018. -Cycle 1 was tolerated very poorly with severe nausea and diarrhea and worsening renal function. 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 -Cycle 2 of FOLFOX on 09/07/2018 with oxaliplatin dose reduced by 20%. - He had watery diarrhea and some soreness in the mouth along with nausea after cycle 2.  Cycle 3 was held on 09/22/2018 secondary to gout of right hand middle finger and right knee. - We reviewed his labs today which are much better than before.  He will proceed with cycle 3.  I will dose reduce infusional 5-FU by 20%. -I will see him back in 1 week for follow-up.  2.  Gout: -He had acute flareup of gout on 09/22/2018 which was treated with Medrol Dosepak, colchicine. -He was told to go back to taking allopurinol for prophylaxis.  3.  Cognitive dysfunction: - He has difficulty understanding the directions to take medications.  Wife is present at bedside today.  She reported that he forgot that he had the car which he bought 20 years ago.  He is also forgetful of minor things at home. - No family history of dementia.  Cognitive dysfunction could be related to his malignancy. - We talked about giving a trial of donepezil 5 mg at bedtime, to be increased to 10 mg if needed in 4 to 6 weeks.  4.  CKD: -His creatinine improved to 1.12 today.

## 2018-10-19 NOTE — Progress Notes (Signed)
1038 Labs reviewed with and pt seen by Dr. Delton Coombes and pt approved for chemo tx today with dose reduction per MD                                                                                            1420 IV pump alarming,nurse entered pt's room to find the pt returning from the bathroom and the IV pump with chemotherapy hanging disconnected from the port access with chemotherapy infusing in the floor. Pt sat back down and proceeded to reconnect his chemotherapy to his port access. Pt was questioned and he did admit to disconnecting his chemo from his port access to go to the bathroom.Chemotherapy spill, which was < 10 ml, was cleaned up using chemo spill kit per protocol and pt was moved to a different room. Dr. Delton Coombes was notified and chemotherapy to be resumed per MD order. Port de-accessed then re-accessed in order to continue chemotherapy                                                                      KAYDE WAREHIME tolerated chemotherapy tx well. 5FU pump infusing without issues upon discharge. VSS upon discharge. Reminded pt several times prior to discharge not to disconnect the 5FU pump at all but to press the stop button if the pump accidentally becomes disconnected. Pt verbalized understanding. Pt discharged self ambulatory in satisfactory condition

## 2018-10-19 NOTE — Patient Instructions (Addendum)
[facility] at [facility] Discharge Instructions  You were seen today by Dr. Katragadda. He went over your recent lab results. He will see you back in 1 week for labs and follow up.   Thank you for choosing [facility] at [facility] to provide your oncology and hematology care.  To afford each patient quality time with our provider, please arrive at least 15 minutes before your scheduled appointment time.   If you have a lab appointment with the Cancer Center please come in thru the  Main Entrance and check in at the main information desk  You need to re-schedule your appointment should you arrive 10 or more minutes late.  We strive to give you quality time with our providers, and arriving late affects you and other patients whose appointments are after yours.  Also, if you no show three or more times for appointments you may be dismissed from the clinic at the providers discretion.     Again, thank you for choosing [facility].  Our hope is that these requests will decrease the amount of time that you wait before being seen by our physicians.       _____________________________________________________________  Should you have questions after your visit to [facility], please contact our office at [phone] between the hours of 8:00 a.m. and 4:30 p.m.  Voicemails left after 4:00 p.m. will not be returned until the following business day.  For prescription refill requests, have your pharmacy contact our office and allow 72 hours.    Cancer Center Support Programs:   > Cancer Support Group  2nd Tuesday of the month 1pm-2pm, Journey Room    

## 2018-10-19 NOTE — Patient Instructions (Addendum)
[facility] Discharge Instructions for Patients Receiving Chemotherapy   Beginning January 23rd 2017 lab work for the [facility] will be done in the  Main lab at [facility] on 1st floor. If you have a lab appointment with the [facility] please come in thru the  Main Entrance and check in at the main information desk   Today you received the following chemotherapy agents Bevacizumab,Oxaliplatin and 5FU. Follow-up as scheduled. Call clinic for any questions or concerns  To help prevent nausea and vomiting after your treatment, we encourage you to take your nausea medication   If you develop nausea and vomiting, or diarrhea that is not controlled by your medication, call the clinic.  The clinic phone number is [phone]. Office hours are Monday-Friday 8:30am-5:00pm.  BELOW ARE SYMPTOMS THAT SHOULD BE REPORTED IMMEDIATELY:  *FEVER GREATER THAN 101.0 F  *CHILLS WITH OR WITHOUT FEVER  NAUSEA AND VOMITING THAT IS NOT CONTROLLED WITH YOUR NAUSEA MEDICATION  *UNUSUAL SHORTNESS OF BREATH  *UNUSUAL BRUISING OR BLEEDING  TENDERNESS IN MOUTH AND THROAT WITH OR WITHOUT PRESENCE OF ULCERS  *URINARY PROBLEMS  *BOWEL PROBLEMS  UNUSUAL RASH Items with * indicate a potential emergency and should be followed up as soon as possible. If you have an emergency after office hours please contact your primary care physician or go to the nearest emergency department.  Please call the clinic during office hours if you have any questions or concerns.   You may also contact the Patient Navigator at [phone] should you have any questions or need assistance in obtaining follow up care.      Resources For Cancer Patients and their Caregivers ? American Cancer Society: Can assist with transportation, wigs, general needs, runs Look Good Feel Better.        831-139-4103 ? Cancer Care: Provides financial assistance, online support groups, medication/co-pay assistance.   1-800-813-HOPE 272-515-1801) ? [place] Assists [place] cancer patients and their families through emotional , educational and financial support.  778-168-3139 ? [place] DSS Where to apply for food stamps, Medicaid and utility assistance. (725) 367-7348 ? [facility]: Transportation to medical appointments. 204-354-6022 ? Social Security Administration: May apply for disability if have a Stage IV cancer. [phone] 803-663-7622 ? [employer], Disability and Transit Services: Assists with nutrition, care and transit needs. [phone]

## 2018-10-19 NOTE — Progress Notes (Signed)
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 Focal narrowing and wall thickening of the sigmoid colon is noted in this area most likely due to inflammation, but neoplasm cannot be excluded, and sigmoidoscopy is recommended for further evaluation.   03/20/2016 Procedure   Colonoscopy by Dr. Laural Golden- Malignant partially obstructing tumor in the distal sigmoid colon. Biopsied. - Incomplete exam.   03/23/2016 Pathology Results   Colon, biopsy, sigmoid mass - ADENOCARCINOMA.   03/23/2016 Procedure   Low anterior resection with transanal anastomosis   03/26/2016 Pathology Results   Colon, segmental resection for tumor, sigmoid - INVASIVE WELL DIFFERENTIATED ADENOCARCINOMA, SPANNING 5 CM IN GREATEST DIMENSION. - TUMOR INVADES THROUGH MUSCULARIS PROPRIA TO INVOLVE SUBSEROSAL SOFT TISSUES. - UNDERLYING MARKED ACUTE AND CHRONIC INFLAMMATION WITH GIANT CELL REACTION AND ABSCESS FORMATION EXTENDING TO THE SEROSAL SURFACE AND INVOLVING THE MESENTERIC TISSUE. - MARGINS ARE NEGATIVE FOR TUMOR. - FOURTEEN BENIGN LYMPH NODES WHICH DEMONSTRATE VARIABLE INVOLVEMENT WITH ACUTE AND CHRONIC INFLAMMATION WITH ASSOCIATED GIANT CELLS WITHOUT TUMOR IDENTIFIED (0/14).   04/08/2016 Cancer Staging   Cancer Staging Adenocarcinoma of sigmoid colon [facility]) Staging form: Colon and Rectum, AJCC 8th Edition - Pathologic stage from 04/01/2016: Stage IIA (pT3, pN0, cM0) - Signed by Baird Cancer, PA-C on 04/01/2016    07/09/2016 Imaging   CT abd/pelvis- No evidence of metastatic disease or  other acute findings.  Bilateral nonobstructing renal calculi and mild bilateral renal parenchymal scarring.  Stable mildly enlarged prostate gland and findings of chronic bladder outlet obstruction.   08/16/2018 -  Chemotherapy   The patient had palonosetron (ALOXI) injection 0.25 mg, 0.25 mg, Intravenous,  Once, 3 of 6 cycles Administration: 0.25 mg (08/16/2018), 0.25 mg (09/07/2018), 0.25 mg (10/19/2018) pegfilgrastim (NEULASTA) injection 6 mg, 6 mg, Subcutaneous, Once, 3 of 6 cycles Administration: 6 mg (08/18/2018), 6 mg (09/09/2018) irinotecan (CAMPTOSAR) 380 mg in sodium chloride 0.9 % 500 mL chemo infusion, 165 mg/m2 = 380 mg, Intravenous,  Once, 1 of 4 cycles Administration: 380 mg (08/16/2018) leucovorin 900 mg in dextrose 5 % 250 mL infusion, 400 mg/m2 = 900 mg, Intravenous,  Once, 3 of 6 cycles Administration: 900 mg (08/16/2018), 900 mg (09/07/2018) oxaliplatin (ELOXATIN) 190 mg in dextrose 5 % 500 mL chemo infusion, 85 mg/m2 = 190 mg, Intravenous,  Once, 3 of 6 cycles Dose modification: 68 mg/m2 (80 % of original dose 85 mg/m2, Cycle 2, Reason: Provider Judgment), 68 mg/m2 (80 % of original dose 85 mg/m2, Cycle 3, Reason: Provider Judgment) Administration: 190 mg (08/16/2018), 150 mg (09/07/2018) fosaprepitant (EMEND) 150 mg, dexamethasone (DECADRON) 12 mg in sodium chloride 0.9 % 145 mL IVPB, , Intravenous,  Once, 3 of 6 cycles Administration:  (08/16/2018),  (09/07/2018),  (10/19/2018) fluorouracil (ADRUCIL) 5,400 mg in sodium chloride 0.9 % 142 mL chemo infusion, 2,400 mg/m2 = 5,400 mg, Intravenous, 1 Day/Dose, 3 of 6 cycles Dose modification: 1,920 mg/m2 (80 % of original dose 2,400 mg/m2, Cycle 3, Reason: Provider Judgment) Administration: 5,400 mg (08/16/2018), 5,400 mg (09/07/2018) bevacizumab-awwb (MVASI) 500 mg in sodium chloride 0.9 % 100 mL chemo infusion, 5 mg/kg = 500 mg (100 %  of original dose 5 mg/kg), Intravenous,  Once, 3 of 6 cycles Dose modification: 5 mg/kg (original dose 5 mg/kg,  Cycle 1), 5 mg/kg (original dose 5 mg/kg, Cycle 2) Administration: 500 mg (08/16/2018), 500 mg (09/07/2018)  for chemotherapy treatment.       CANCER STAGING: Cancer Staging Adenocarcinoma of sigmoid colon [facility]) Staging form: Colon and Rectum, AJCC 8th Edition - Pathologic stage from 04/01/2016: Stage IIA (pT3, pN0, cM0) - Signed by Baird Cancer, PA-C on 04/01/2016    INTERVAL HISTORY:  Mr. Zubia 63 y.o. male seen for follow-up of metastatic sigmoid colon cancer to the liver.  Last chemotherapy was on 09/07/2018.  His chemotherapy was delayed on 09/22/2018 secondary to gouty episode of right middle finger and right knee.  Patient's wife is accompanying him today reports that he has been having cognitive dysfunction with memory issues.  Wife reported that he developed a rash around his ankles.  No rash was seen today.  He also gained 6 pounds since last visit.  He is eating better.  Denies any nausea vomiting diarrhea or constipation.  He reported knee pains at times.    REVIEW OF SYSTEMS:  Review of Systems  All other systems reviewed and are negative.    PAST MEDICAL/SURGICAL HISTORY:  Past Medical History:  Diagnosis Date  . 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 Carpal tunnel syndrome 05/02/2014   Bilateral  . Cervical disc disorder with radiculopathy of cervical region 05/02/2014   Left C8  . Diabetes mellitus without complication ([place])   . Hypertension   . Port-A-Cath in place 08/10/2018   Past Surgical History:  Procedure Laterality Date  . BIOPSY  03/20/2016   Procedure: BIOPSY;  Surgeon: Rogene Houston, MD;  Location: [facility] ENDO [facility];  Service: Endoscopy;;  colon  . BOWEL RESECTION N/A 03/23/2016   Procedure: LOW ANTERIOR RESECTION;  Surgeon: Aviva Signs, MD;  Location: [facility] ORS;  Service: General;  Laterality: N/A;  . COLONOSCOPY N/A 03/20/2016   Procedure: COLONOSCOPY;  Surgeon: Rogene Houston, MD;  Location: [facility] ENDO SUITE;  Service: Endoscopy;  Laterality:  N/A;  730  . COLONOSCOPY N/A 07/10/2016   Procedure: COLONOSCOPY;  Surgeon: Rogene Houston, MD;  Location: [facility] ENDO SUITE;  Service: Endoscopy;  Laterality: N/A;  1040  . NO PAST SURGERIES    . POLYPECTOMY  07/10/2016   Procedure: POLYPECTOMY;  Surgeon: Rogene Houston, MD;  Location: [facility] END[facility];  Service: Endoscopy;;  colon  . PORTACATH PLACEMENT Left 08/05/2018   Procedure: INSERTION PORT-A-CATH;  Surgeon: Aviva Signs, MD;  Location: [facility] ORS;  Service: General;  Laterality: Left;  . TONSILLECTOMY       SOCIAL HISTORY:  Social History   Socioeconomic History  . Marital status: Married    Spouse name: Not on file  . Number of children: Not on file  . Years of education: Not on file  . Highest education level: Not on file  Occupational History  . Not on file  Social Needs  . Financial resource strain: Not on file  . Food insecurity    Worry: Not on file    Inability: Not on file  . Transportation needs    Medical: Not on file    Non-medical: Not on file  Tobacco Use  . Smoking status: Never Smoker  . Smokeless tobacco: Never Used  Substance and Sexual Activity  . Alcohol use: Yes    Comment: rarely  . Drug use: No  . Sexual activity:  Not on file  Lifestyle  . Physical activity    Days per week: Not on file    Minutes per session: Not on file  . Stress: Not on file  Relationships  . Social Herbalist on phone: Not on file    Gets together: Not on file    Attends religious service: Not on file    Active member of club or organization: Not on file    Attends meetings of clubs or organizations: Not on file    Relationship status: Not on file  . Intimate partner violence    Fear of current or ex partner: Not on file    Emotionally abused: Not on file    Physically abused: Not on file    Forced sexual activity: Not on file  Other Topics Concern  . Not on file  Social History Narrative  . Not on file    FAMILY HISTORY:  Family History  Problem  Relation Age of Onset  . COPD Mother   . Melanoma Father     CURRENT MEDICATIONS:  Outpatient Encounter Medications as of 10/19/2018  Medication Sig  . allopurinol (ZYLOPRIM) 300 MG tablet Take 1 tablet (300 mg total) by mouth daily.  Marland Kitchen amLODipine (NORVASC) 10 MG tablet Take 10 mg by mouth daily.  . Bevacizumab (AVASTIN IV) Inject into the vein every 14 (fourteen) days.  . colchicine 0.6 MG tablet Take 0.5 tablets (0.3 mg total) by mouth 2 (two) times daily.  Marland Kitchen dextrose 5 % SOLN 1,000 mL with fluorouracil 5 GM/100ML SOLN Inject into the vein over 48 hr.  . diphenoxylate-atropine (LOMOTIL) 2.5-0.025 MG tablet Take 1 tablet by mouth 4 (four) times daily as needed for diarrhea or loose stools.  . donepezil (ARICEPT) 5 MG tablet Take 1 tablet (5 mg total) by mouth at bedtime.  Marland Kitchen FLUOROURACIL IV Inject into the vein every 14 (fourteen) days.  . hydrochlorothiazide (HYDRODIURIL) 25 MG tablet Take 25 mg by mouth daily.   Marland Kitchen HYDROcodone-acetaminophen (NORCO) 5-325 MG tablet Take 1 tablet by mouth every 4 (four) hours as needed for moderate pain.  Marland Kitchen insulin glargine (LANTUS) 100 UNIT/ML injection Inject 30 Units into the skin at bedtime.   . IRINOTECAN HCL IV Inject into the vein every 14 (fourteen) days.  Marland Kitchen LEUCOVORIN CALCIUM IV Inject into the vein every 14 (fourteen) days.  Marland Kitchen lidocaine-prilocaine (EMLA) cream Apply a small amount to port a cath site and cover with plastic wrap 1 hour prior to chemotherapy appointments  . loperamide (IMODIUM A-D) 2 MG tablet Take 2 at onset of diarrhea, then 1 after each watery bowel movement.  . magic mouthwash w/lidocaine SOLN Take 5 mLs by mouth 4 (four) times daily as needed for mouth pain.  . methylPREDNISolone (MEDROL DOSEPAK) 4 MG TBPK tablet Take as directed  . metoprolol succinate (TOPROL-XL) 50 MG 24 hr tablet Take 50 mg by mouth daily. Take with or immediately following a meal.  . ondansetron (ZOFRAN ODT) 8 MG disintegrating tablet Take 1 tablet (8 mg  total) by mouth every 8 (eight) hours as needed for nausea or vomiting.  . OXALIPLATIN IV Inject into the vein every 14 (fourteen) days.  . potassium chloride SA (K-DUR) 20 MEQ tablet Take 1 tablet (20 mEq total) by mouth daily.  . prochlorperazine (COMPAZINE) 10 MG tablet Take 1 tablet (10 mg total) by mouth every 6 (six) hours as needed (Nausea or vomiting).  Marland Kitchen scopolamine (TRANSDERM-SCOP) 1 MG/3DAYS Place 1 patch (1.5  mg total) onto the skin every 3 (three) days.   No facility-administered encounter medications on file as of 10/19/2018.     ALLERGIES:  Allergies  Allergen Reactions  . Penicillin G Nausea And Vomiting     PHYSICAL EXAM:  ECOG Performance status: 1  Vital signs: Blood pressure is 124/98.  Pulse rate is 84.  Respiratory is 18.  Temperature 98.1.  Oxygen saturations are 100.  Physical Exam Cardiovascular:     Rate and Rhythm: Normal rate and regular rhythm.     Heart sounds: Normal heart sounds.  Pulmonary:     Effort: Pulmonary effort is normal.     Breath sounds: Normal breath sounds.  Abdominal:     General: There is no distension.     Palpations: Abdomen is soft. There is no mass.  Musculoskeletal: Normal range of motion.  Skin:    General: Skin is warm and dry.  Neurological:     Mental Status: He is alert and oriented to person, place, and time. Mental status is at baseline.  Psychiatric:        Mood and Affect: Mood normal.        Behavior: Behavior normal.        Thought Content: Thought content normal.        Judgment: Judgment normal.      LABORATORY DATA:  I have reviewed the labs as listed.  CBC    Component Value Date/Time   WBC 5.3 10/19/2018 0921   RBC 3.51 (L) 10/19/2018 0921   HGB 10.0 (L) 10/19/2018 0921   HCT 32.0 (L) 10/19/2018 0921   PLT 175 10/19/2018 0921   MCV 91.2 10/19/2018 0921   MCH 28.5 10/19/2018 0921   MCHC 31.3 10/19/2018 0921   RDW 15.1 10/19/2018 0921   LYMPHSABS 1.6 10/19/2018 0921   MONOABS 0.3 10/19/2018  0921   EOSABS 0.2 10/19/2018 0921   BASOSABS 0.0 10/19/2018 0921   CMP Latest Ref Rng & Units 10/19/2018 09/21/2018 09/12/2018  Glucose 70 - 99 mg/dL 160(H) 111(H) 83  BUN 8 - 23 mg/dL 18 15 16   Creatinine 0.61 - 1.24 mg/dL 1.12 1.60(H) 1.74(H)  Sodium 135 - 145 mmol/L 136 136 138  Potassium 3.5 - 5.1 mmol/L 3.6 2.7(LL) 4.0  Chloride 98 - 111 mmol/L 107 102 102  CO2 22 - 32 mmol/L 22 21(L) 24  Calcium 8.9 - 10.3 mg/dL 8.8(L) 9.1 9.2  Total Protein 6.5 - 8.1 g/dL 5.8(L) 6.2(L) 6.5  Total Bilirubin 0.3 - 1.2 mg/dL 0.9 1.1 0.8  Alkaline Phos 38 - 126 U/L 67 77 151(H)  AST 15 - 41 U/L 14(L) 16 20  ALT 0 - 44 U/L 16 16 21      I have independently reviewed scans and discussed with the patient.  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 - Cycle 1 of FOLFOX ED and bevacizumab on 08/16/2018. -Cycle 1 was tolerated very poorly with severe nausea and diarrhea and worsening renal function. 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 -Cycle 2 of FOLFOX on 09/07/2018 with oxaliplatin dose reduced by 20%. - He had watery diarrhea and some soreness in the mouth along with nausea after cycle 2.  Cycle 3 was held on 09/22/2018 secondary to gout of right hand middle finger and right knee. - We reviewed his labs today which are much better  than before.  He will proceed with cycle 3.  I will dose reduce infusional 5-FU by 20%. -I will see him back in 1 week for follow-up.  2.  Gout: -He had acute flareup of gout on 09/22/2018 which was treated with Medrol Dosepak, colchicine. -He was told to go back to taking allopurinol for prophylaxis.  3.  Cognitive dysfunction: - He has difficulty understanding the directions to take medications.  Wife is present at bedside today.  She reported that he forgot that he had the car which he bought 20  years ago.  He is also forgetful of minor things at home. - No family history of dementia.  Cognitive dysfunction could be related to his malignancy. - We talked about giving a trial of donepezil 5 mg at bedtime, to be increased to 10 mg if needed in 4 to 6 weeks.  4.  CKD: -His creatinine improved to 1.12 today.   Total time spent is 40 minutes with more than 50% of the time spent face-to-face discussing lab results, treatment plan, counseling and coordination of care.  Orders placed this encounter:  Orders Placed This Encounter  Procedures  . CBC with Differential/Platelet  . Comprehensive metabolic panel  . Magnesium  . Uric acid     Derek Jack, MD [place] [phone]

## 2018-10-20 LAB — CEA: CEA: 5.3 ng/mL — ABNORMAL HIGH (ref 0.0–4.7)

## 2018-10-21 ENCOUNTER — Encounter (HOSPITAL_COMMUNITY): Payer: Self-pay

## 2018-10-21 ENCOUNTER — Other Ambulatory Visit: Payer: Self-pay

## 2018-10-21 ENCOUNTER — Inpatient Hospital Stay (HOSPITAL_COMMUNITY): Payer: 59

## 2018-10-21 VITALS — BP 122/74 | HR 66 | Temp 97.8°F | Resp 18

## 2018-10-21 DIAGNOSIS — Z5112 Encounter for antineoplastic immunotherapy: Secondary | ICD-10-CM | POA: Diagnosis not present

## 2018-10-21 DIAGNOSIS — Z95828 Presence of other vascular implants and grafts: Secondary | ICD-10-CM

## 2018-10-21 DIAGNOSIS — C187 Malignant neoplasm of sigmoid colon: Secondary | ICD-10-CM

## 2018-10-21 MED ORDER — HEPARIN SOD (PORK) LOCK FLUSH 100 UNIT/ML IV SOLN
500.0000 [IU] | Freq: Once | INTRAVENOUS | Status: AC | PRN
  Administered 2018-10-21: 13:00:00 500 [IU]

## 2018-10-21 MED ORDER — SODIUM CHLORIDE 0.9% FLUSH
10.0000 mL | INTRAVENOUS | Status: DC | PRN
  Administered 2018-10-21: 13:00:00 10 mL
  Filled 2018-10-21: qty 10

## 2018-10-21 MED ORDER — PEGFILGRASTIM INJECTION 6 MG/0.6ML ~~LOC~~
6.0000 mg | PREFILLED_SYRINGE | Freq: Once | SUBCUTANEOUS | Status: AC
  Administered 2018-10-21: 6 mg via SUBCUTANEOUS
  Filled 2018-10-21: qty 0.6

## 2018-10-21 NOTE — Progress Notes (Signed)
Patient arrived early for pump disconnect.  Volume left in bag 12.2 ml of chemotherapy.  Instructed the patient he had time left on his chemotherapy pump.  Patient asked to have the pump disconnected due to returning to work and having patients waiting on him.  Patient stated he did not want to wait or return at the correct time for pump removal.  Dr. Delton Coombes notified.  Patients port flushed without difficulty.  Good blood return noted with no bruising or swelling noted at site.  Band aid applied.  VSS with discharge and left ambulatory with no s/s of distress noted.

## 2018-10-26 ENCOUNTER — Encounter (HOSPITAL_COMMUNITY): Payer: Self-pay | Admitting: Hematology

## 2018-10-26 ENCOUNTER — Other Ambulatory Visit: Payer: Self-pay

## 2018-10-26 ENCOUNTER — Inpatient Hospital Stay (HOSPITAL_COMMUNITY): Payer: 59

## 2018-10-26 ENCOUNTER — Inpatient Hospital Stay (HOSPITAL_BASED_OUTPATIENT_CLINIC_OR_DEPARTMENT_OTHER): Payer: 59 | Admitting: Hematology

## 2018-10-26 DIAGNOSIS — Z5112 Encounter for antineoplastic immunotherapy: Secondary | ICD-10-CM | POA: Diagnosis not present

## 2018-10-26 DIAGNOSIS — C187 Malignant neoplasm of sigmoid colon: Secondary | ICD-10-CM

## 2018-10-26 LAB — COMPREHENSIVE METABOLIC PANEL
ALT: 12 U/L (ref 0–44)
AST: 14 U/L — ABNORMAL LOW (ref 15–41)
Albumin: 3.4 g/dL — ABNORMAL LOW (ref 3.5–5.0)
Alkaline Phosphatase: 116 U/L (ref 38–126)
Anion gap: 9 (ref 5–15)
BUN: 12 mg/dL (ref 8–23)
CO2: 23 mmol/L (ref 22–32)
Calcium: 9.2 mg/dL (ref 8.9–10.3)
Chloride: 109 mmol/L (ref 98–111)
Creatinine, Ser: 1.34 mg/dL — ABNORMAL HIGH (ref 0.61–1.24)
GFR calc Af Amer: >60 mL/min (ref >60)
GFR calc non Af Amer: 56 mL/min — ABNORMAL LOW (ref >60)
Glucose, Bld: 144 mg/dL — ABNORMAL HIGH (ref 70–99)
Potassium: 4.1 mmol/L (ref 3.5–5.1)
Sodium: 141 mmol/L (ref 135–145)
Total Bilirubin: 0.6 mg/dL (ref 0.3–1.2)
Total Protein: 6 g/dL — ABNORMAL LOW (ref 6.5–8.1)

## 2018-10-26 LAB — CBC WITH DIFFERENTIAL/PLATELET
Abs Immature Granulocytes: 0.14 10*3/uL — ABNORMAL HIGH (ref 0.00–0.07)
Basophils Absolute: 0.1 10*3/uL (ref 0.0–0.1)
Basophils Relative: 1 %
Eosinophils Absolute: 0.3 10*3/uL (ref 0.0–0.5)
Eosinophils Relative: 2 %
HCT: 31.3 % — ABNORMAL LOW (ref 39.0–52.0)
Hemoglobin: 9.6 g/dL — ABNORMAL LOW (ref 13.0–17.0)
Immature Granulocytes: 1 %
Lymphocytes Relative: 15 %
Lymphs Abs: 1.7 10*3/uL (ref 0.7–4.0)
MCH: 28.6 pg (ref 26.0–34.0)
MCHC: 30.7 g/dL (ref 30.0–36.0)
MCV: 93.2 fL (ref 80.0–100.0)
Monocytes Absolute: 0.8 10*3/uL (ref 0.1–1.0)
Monocytes Relative: 7 %
Neutro Abs: 7.9 10*3/uL — ABNORMAL HIGH (ref 1.7–7.7)
Neutrophils Relative %: 74 %
Platelets: 147 10*3/uL — ABNORMAL LOW (ref 150–400)
RBC: 3.36 MIL/uL — ABNORMAL LOW (ref 4.22–5.81)
RDW: 15.2 % (ref 11.5–15.5)
WBC: 10.8 10*3/uL — ABNORMAL HIGH (ref 4.0–10.5)
nRBC: 0 % (ref 0.0–0.2)

## 2018-10-26 LAB — MAGNESIUM: Magnesium: 1.7 mg/dL (ref 1.7–2.4)

## 2018-10-26 LAB — URIC ACID: Uric Acid, Serum: 5.9 mg/dL (ref 3.7–8.6)

## 2018-10-26 NOTE — Assessment & Plan Note (Signed)
1.  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 - Cycle 1 of FOLFOX ED and bevacizumab on 08/16/2018. -Cycle 1 was tolerated very poorly with severe nausea and diarrhea and worsening renal function. 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 -Cycle 2 of FOLFOX on 09/07/2018 with oxaliplatin dose reduced by 20%. - He had watery diarrhea and some soreness in the mouth along with nausea after cycle 2.  Cycle 3 was held on 09/22/2018 secondary to gout of right hand middle finger and right knee. - He received cycle 3 with 20% dose reduction of infusional 5-FU on 10/19/2018. -He has tolerated it so far very well without any major side effects.  We reviewed his blood work which is grossly within normal limits. - His CEA level has also come down to 5.3.  He will come back next week to start cycle 4.  I plan to repeat scans after cycle 6.  2.  Gout: -He had acute flareup of gout on 09/22/2018 which was treated with Medrol Dosepak, colchicine. -He is continuing allopurinol.  Uric acid is 5.9 today.  3.  Cognitive dysfunction: - He has difficulty understanding the directions to take medications.  Wife is present at bedside today.  She reported that he forgot that he had the car which he bought 20 years ago.  He is also forgetful of minor things at home. - No family history of dementia.  Cognitive dysfunction could be related to his malignancy. - I have started him on donepezil 5 mg at bedtime, at last visit 1 week ago.  He started taking the pills 3 days ago.  It is too early to tell the difference.  4.  CKD: -Creatinine is 1.34 today.

## 2018-10-26 NOTE — Progress Notes (Signed)
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 Focal narrowing and wall thickening of the sigmoid colon is noted in this area most likely due to inflammation, but neoplasm cannot be excluded, and sigmoidoscopy is recommended for further evaluation.   03/20/2016 Procedure   Colonoscopy by Dr. Laural Golden- Malignant partially obstructing tumor in the distal sigmoid colon. Biopsied. - Incomplete exam.   03/23/2016 Pathology Results   Colon, biopsy, sigmoid mass - ADENOCARCINOMA.   03/23/2016 Procedure   Low anterior resection with transanal anastomosis   03/26/2016 Pathology Results   Colon, segmental resection for tumor, sigmoid - INVASIVE WELL DIFFERENTIATED ADENOCARCINOMA, SPANNING 5 CM IN GREATEST DIMENSION. - TUMOR INVADES THROUGH MUSCULARIS PROPRIA TO INVOLVE SUBSEROSAL SOFT TISSUES. - UNDERLYING MARKED ACUTE AND CHRONIC INFLAMMATION WITH GIANT CELL REACTION AND ABSCESS FORMATION EXTENDING TO THE SEROSAL SURFACE AND INVOLVING THE MESENTERIC TISSUE. - MARGINS ARE NEGATIVE FOR TUMOR. - FOURTEEN BENIGN LYMPH NODES WHICH DEMONSTRATE VARIABLE INVOLVEMENT WITH ACUTE AND CHRONIC INFLAMMATION WITH ASSOCIATED GIANT CELLS WITHOUT TUMOR IDENTIFIED (0/14).   04/08/2016 Cancer Staging   Cancer Staging Adenocarcinoma of sigmoid colon [facility]) Staging form: Colon and Rectum, AJCC 8th Edition - Pathologic stage from 04/01/2016: Stage IIA (pT3, pN0, cM0) - Signed by Baird Cancer, PA-C on 04/01/2016    07/09/2016 Imaging   CT abd/pelvis- No evidence of metastatic disease or  other acute findings.  Bilateral nonobstructing renal calculi and mild bilateral renal parenchymal scarring.  Stable mildly enlarged prostate gland and findings of chronic bladder outlet obstruction.   08/16/2018 -  Chemotherapy   The patient had palonosetron (ALOXI) injection 0.25 mg, 0.25 mg, Intravenous,  Once, 3 of 6 cycles Administration: 0.25 mg (08/16/2018), 0.25 mg (09/07/2018), 0.25 mg (10/19/2018) pegfilgrastim (NEULASTA) injection 6 mg, 6 mg, Subcutaneous, Once, 3 of 6 cycles Administration: 6 mg (08/18/2018), 6 mg (09/09/2018), 6 mg (10/21/2018) irinotecan (CAMPTOSAR) 380 mg in sodium chloride 0.9 % 500 mL chemo infusion, 165 mg/m2 = 380 mg, Intravenous,  Once, 1 of 4 cycles Administration: 380 mg (08/16/2018) leucovorin 900 mg in dextrose 5 % 250 mL infusion, 400 mg/m2 = 900 mg, Intravenous,  Once, 3 of 6 cycles Administration: 900 mg (08/16/2018), 900 mg (09/07/2018), 900 mg (10/19/2018) oxaliplatin (ELOXATIN) 190 mg in dextrose 5 % 500 mL chemo infusion, 85 mg/m2 = 190 mg, Intravenous,  Once, 3 of 6 cycles Dose modification: 68 mg/m2 (80 % of original dose 85 mg/m2, Cycle 2, Reason: Provider Judgment), 68 mg/m2 (80 % of original dose 85 mg/m2, Cycle 3, Reason: Provider Judgment) Administration: 190 mg (08/16/2018), 150 mg (09/07/2018), 150 mg (10/19/2018) fosaprepitant (EMEND) 150 mg, dexamethasone (DECADRON) 12 mg in sodium chloride 0.9 % 145 mL IVPB, , Intravenous,  Once, 3 of 6 cycles Administration:  (08/16/2018),  (09/07/2018),  (10/19/2018) fluorouracil (ADRUCIL) 5,400 mg in sodium chloride 0.9 % 142 mL chemo infusion, 2,400 mg/m2 = 5,400 mg, Intravenous, 1 Day/Dose, 3 of 6 cycles Dose modification: 1,920 mg/m2 (80 % of original dose 2,400 mg/m2, Cycle 3, Reason: Provider Judgment) Administration: 5,400 mg (08/16/2018), 5,400 mg (09/07/2018), 4,300 mg (10/19/2018) bevacizumab-awwb (MVASI) 500 mg in sodium chloride 0.9 %  100 mL chemo infusion, 5 mg/kg = 500 mg (100 % of original dose 5 mg/kg),  Intravenous,  Once, 3 of 6 cycles Dose modification: 5 mg/kg (original dose 5 mg/kg, Cycle 1), 5 mg/kg (original dose 5 mg/kg, Cycle 2) Administration: 500 mg (08/16/2018), 500 mg (09/07/2018), 500 mg (10/19/2018)  for chemotherapy treatment.       CANCER STAGING: Cancer Staging Adenocarcinoma of sigmoid colon [facility]) Staging form: Colon and Rectum, AJCC 8th Edition - Pathologic stage from 04/01/2016: Stage IIA (pT3, pN0, cM0) - Signed by Baird Cancer, PA-C on 04/01/2016    INTERVAL HISTORY:  Troy Smith 63 y.o. male seen for follow-up of metastatic sigmoid colon cancer to the liver.  Last chemotherapy was on 10/19/2018.  He did not experience any nausea, vomiting, diarrhea or constipation.  He has started taking donepezil 5 mg at bedtime about 3 days ago.  He denies any arthritic pains of his right hand middle finger or right knee.  He is taking allopurinol daily.  Appetite and energy levels are 75%.  No pain is reported.    REVIEW OF SYSTEMS:  Review of Systems  All other systems reviewed and are negative.    PAST MEDICAL/SURGICAL HISTORY:  Past Medical History:  Diagnosis Date  . 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 Carpal tunnel syndrome 05/02/2014   Bilateral  . Cervical disc disorder with radiculopathy of cervical region 05/02/2014   Left C8  . Diabetes mellitus without complication ([place])   . Hypertension   . Port-A-Cath in place 08/10/2018   Past Surgical History:  Procedure Laterality Date  . BIOPSY  03/20/2016   Procedure: BIOPSY;  Surgeon: Rogene Houston, MD;  [facility];  Service: Endoscopy;;  colon  . BOWEL RESECTION N/A 03/23/2016   Procedure: LOW ANTERIOR RESECTION;  Surgeon: Aviva Signs, MD;  Location: [facility] ORS;  Service: General;  Laterality: N/A;  . COLONOSCOPY N/A 03/20/2016   Procedure: COLONOSCOPY;  Surgeon: Rogene Houston, MD;  Location: [facility];  Service: Endoscopy;  Laterality: N/A;  730  . COLONOSCOPY N/A 07/10/2016   Procedure:  COLONOSCOPY;  Surgeon: Rogene Houston, MD;  Location: [facility];  Service: Endoscopy;  Laterality: N/A;  1040  . NO PAST SURGERIES    . POLYPECTOMY  07/10/2016   Procedure: POLYPECTOMY;  Surgeon: Rogene Houston, MD;  Location: [facility];  Service: Endoscopy;;  colon  . PORTACATH PLACEMENT Left 08/05/2018   Procedure: INSERTION PORT-A-CATH;  Surgeon: Aviva Signs, MD;  Location: [facility] ORS;  Service: General;  Laterality: Left;  . TONSILLECTOMY       SOCIAL HISTORY:  Social History   Socioeconomic History  . Marital status: Married    Spouse name: Not on file  . Number of children: Not on file  . Years of education: Not on file  . Highest education level: Not on file  Occupational History  . Not on file  Social Needs  . Financial resource strain: Not on file  . Food insecurity    Worry: Not on file    Inability: Not on file  . Transportation needs    Medical: Not on file    Non-medical: Not on file  Tobacco Use  . Smoking status: Never Smoker  . Smokeless tobacco: Never Used  Substance and Sexual Activity  . Alcohol use: Yes    Comment: rarely  . Drug use: No  . Sexual activity: Not on file  Lifestyle  . Physical activity  Days per week: Not on file    Minutes per session: Not on file  . Stress: Not on file  Relationships  . Social Herbalist on phone: Not on file    Gets together: Not on file    Attends religious service: Not on file    Active member of club or organization: Not on file    Attends meetings of clubs or organizations: Not on file    Relationship status: Not on file  . Intimate partner violence    Fear of current or ex partner: Not on file    Emotionally abused: Not on file    Physically abused: Not on file    Forced sexual activity: Not on file  Other Topics Concern  . Not on file  Social History Narrative  . Not on file    FAMILY HISTORY:  Family History  Problem Relation Age of Onset  . COPD Mother   . Melanoma Father      CURRENT MEDICATIONS:  Outpatient Encounter Medications as of 10/26/2018  Medication Sig  . allopurinol (ZYLOPRIM) 300 MG tablet Take 1 tablet (300 mg total) by mouth daily.  Marland Kitchen amLODipine (NORVASC) 10 MG tablet Take 10 mg by mouth daily.  . Bevacizumab (AVASTIN IV) Inject into the vein every 14 (fourteen) days.  . colchicine 0.6 MG tablet Take 0.5 tablets (0.3 mg total) by mouth 2 (two) times daily.  Marland Kitchen dextrose 5 % SOLN 1,000 mL with fluorouracil 5 GM/100ML SOLN Inject into the vein over 48 hr.  . donepezil (ARICEPT) 5 MG tablet Take 1 tablet (5 mg total) by mouth at bedtime.  Marland Kitchen FLUOROURACIL IV Inject into the vein every 14 (fourteen) days.  . insulin glargine (LANTUS) 100 UNIT/ML injection Inject 30 Units into the skin at bedtime.   . IRINOTECAN HCL IV Inject into the vein every 14 (fourteen) days.  Marland Kitchen LEUCOVORIN CALCIUM IV Inject into the vein every 14 (fourteen) days.  . metoprolol succinate (TOPROL-XL) 50 MG 24 hr tablet Take 50 mg by mouth daily. Take with or immediately following a meal.  . OXALIPLATIN IV Inject into the vein every 14 (fourteen) days.  . potassium chloride SA (K-DUR) 20 MEQ tablet Take 1 tablet (20 mEq total) by mouth daily.  . prochlorperazine (COMPAZINE) 10 MG tablet Take 1 tablet (10 mg total) by mouth every 6 (six) hours as needed (Nausea or vomiting).  . diphenoxylate-atropine (LOMOTIL) 2.5-0.025 MG tablet Take 1 tablet by mouth 4 (four) times daily as needed for diarrhea or loose stools. (Patient not taking: Reported on 10/26/2018)  . HYDROcodone-acetaminophen (NORCO) 5-325 MG tablet Take 1 tablet by mouth every 4 (four) hours as needed for moderate pain. (Patient not taking: Reported on 10/26/2018)  . lidocaine-prilocaine (EMLA) cream Apply a small amount to port a cath site and cover with plastic wrap 1 hour prior to chemotherapy appointments (Patient not taking: Reported on 10/26/2018)  . loperamide (IMODIUM A-D) 2 MG tablet Take 2 at onset of diarrhea, then 1  after each watery bowel movement. (Patient not taking: Reported on 10/26/2018)  . magic mouthwash w/lidocaine SOLN Take 5 mLs by mouth 4 (four) times daily as needed for mouth pain. (Patient not taking: Reported on 10/26/2018)  . scopolamine (TRANSDERM-SCOP) 1 MG/3DAYS Place 1 patch (1.5 mg total) onto the skin every 3 (three) days. (Patient not taking: Reported on 10/26/2018)  . [DISCONTINUED] hydrochlorothiazide (HYDRODIURIL) 25 MG tablet Take 25 mg by mouth daily.   . [DISCONTINUED] methylPREDNISolone (MEDROL  DOSEPAK) 4 MG TBPK tablet Take as directed  . [DISCONTINUED] ondansetron (ZOFRAN ODT) 8 MG disintegrating tablet Take 1 tablet (8 mg total) by mouth every 8 (eight) hours as needed for nausea or vomiting.   No facility-administered encounter medications on file as of 10/26/2018.     ALLERGIES:  Allergies  Allergen Reactions  . Penicillin G Nausea And Vomiting     PHYSICAL EXAM:  ECOG Performance status: 1  Vital signs: Blood pressure is 124/98.  Pulse rate is 84.  Respiratory is 18.  Temperature 98.1.  Oxygen saturations are 100.  Physical Exam Cardiovascular:     Rate and Rhythm: Normal rate and regular rhythm.     Heart sounds: Normal heart sounds.  Pulmonary:     Effort: Pulmonary effort is normal.     Breath sounds: Normal breath sounds.  Abdominal:     General: There is no distension.     Palpations: Abdomen is soft. There is no mass.  Musculoskeletal: Normal range of motion.  Skin:    General: Skin is warm and dry.  Neurological:     Mental Status: He is alert and oriented to person, place, and time. Mental status is at baseline.  Psychiatric:        Mood and Affect: Mood normal.        Behavior: Behavior normal.        Thought Content: Thought content normal.        Judgment: Judgment normal.      LABORATORY DATA:  I have reviewed the labs as listed.  CBC    Component Value Date/Time   WBC 10.8 (H) 10/26/2018 0916   RBC 3.36 (L) 10/26/2018 0916   HGB  9.6 (L) 10/26/2018 0916   HCT 31.3 (L) 10/26/2018 0916   PLT 147 (L) 10/26/2018 0916   MCV 93.2 10/26/2018 0916   MCH 28.6 10/26/2018 0916   MCHC 30.7 10/26/2018 0916   RDW 15.2 10/26/2018 0916   LYMPHSABS 1.7 10/26/2018 0916   MONOABS 0.8 10/26/2018 0916   EOSABS 0.3 10/26/2018 0916   BASOSABS 0.1 10/26/2018 0916   CMP Latest Ref Rng & Units 10/26/2018 10/19/2018 09/21/2018  Glucose 70 - 99 mg/dL 144(H) 160(H) 111(H)  BUN 8 - 23 mg/dL 12 18 15   Creatinine 0.61 - 1.24 mg/dL 1.34(H) 1.12 1.60(H)  Sodium 135 - 145 mmol/L 141 136 136  Potassium 3.5 - 5.1 mmol/L 4.1 3.6 2.7(LL)  Chloride 98 - 111 mmol/L 109 107 102  CO2 22 - 32 mmol/L 23 22 21(L)  Calcium 8.9 - 10.3 mg/dL 9.2 8.8(L) 9.1  Total Protein 6.5 - 8.1 g/dL 6.0(L) 5.8(L) 6.2(L)  Total Bilirubin 0.3 - 1.2 mg/dL 0.6 0.9 1.1  Alkaline Phos 38 - 126 U/L 116 67 77  AST 15 - 41 U/L 14(L) 14(L) 16  ALT 0 - 44 U/L 12 16 16      I have independently reviewed scans and discussed with the patient.  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 - Cycle 1 of FOLFOX ED and bevacizumab on 08/16/2018. -Cycle 1 was tolerated very poorly with severe nausea and diarrhea and worsening renal function. 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 -Cycle 2 of FOLFOX on 09/07/2018 with oxaliplatin dose reduced by 20%. - He had watery diarrhea and some soreness in the mouth along with nausea after cycle  2.  Cycle 3 was held on 09/22/2018 secondary to gout of right hand middle finger and right knee. - He received cycle 3 with 20% dose reduction of infusional 5-FU on 10/19/2018. -He has tolerated it so far very well without any major side effects.  We reviewed his blood work which is grossly within normal limits. - His CEA level has also come down to 5.3.  He will come back next week to  start cycle 4.  I plan to repeat scans after cycle 6.  2.  Gout: -He had acute flareup of gout on 09/22/2018 which was treated with Medrol Dosepak, colchicine. -He is continuing allopurinol.  Uric acid is 5.9 today.  3.  Cognitive dysfunction: - He has difficulty understanding the directions to take medications.  Wife is present at bedside today.  She reported that he forgot that he had the car which he bought 20 years ago.  He is also forgetful of minor things at home. - No family history of dementia.  Cognitive dysfunction could be related to his malignancy. - I have started him on donepezil 5 mg at bedtime, at last visit 1 week ago.  He started taking the pills 3 days ago.  It is too early to tell the difference.  4.  CKD: -Creatinine is 1.34 today.   Total time spent is 25 minutes with more than 50% of the time spent face-to-face discussing treatment plan, counseling and coordination of care.  Orders placed this encounter:  No orders of the defined types were placed in this encounter.    Derek Jack, MD [place] [phone]

## 2018-10-26 NOTE — Patient Instructions (Signed)
[facility] at [facility] Discharge Instructions  You were seen today by Dr. Katragadda. He went over your recent lab results. He will see you back in 1 week for labs, treatment and follow up.   Thank you for choosing [facility] at [facility] to provide your oncology and hematology care.  To afford each patient quality time with our provider, please arrive at least 15 minutes before your scheduled appointment time.   If you have a lab appointment with the Cancer Center please come in thru the  Main Entrance and check in at the main information desk  You need to re-schedule your appointment should you arrive 10 or more minutes late.  We strive to give you quality time with our providers, and arriving late affects you and other patients whose appointments are after yours.  Also, if you no show three or more times for appointments you may be dismissed from the clinic at the providers discretion.     Again, thank you for choosing [facility].  Our hope is that these requests will decrease the amount of time that you wait before being seen by our physicians.       _____________________________________________________________  Should you have questions after your visit to [facility], please contact our office at [phone] between the hours of 8:00 a.m. and 4:30 p.m.  Voicemails left after 4:00 p.m. will not be returned until the following business day.  For prescription refill requests, have your pharmacy contact our office and allow 72 hours.    Cancer Center Support Programs:   > Cancer Support Group  2nd Tuesday of the month 1pm-2pm, Journey Room    

## 2018-11-02 ENCOUNTER — Inpatient Hospital Stay (HOSPITAL_COMMUNITY): Payer: 59

## 2018-11-02 ENCOUNTER — Other Ambulatory Visit: Payer: Self-pay

## 2018-11-02 ENCOUNTER — Encounter (HOSPITAL_COMMUNITY): Payer: Self-pay | Admitting: Hematology

## 2018-11-02 ENCOUNTER — Inpatient Hospital Stay (HOSPITAL_COMMUNITY): Payer: 59 | Attending: Hematology | Admitting: Hematology

## 2018-11-02 VITALS — BP 130/75 | HR 80 | Temp 97.9°F | Resp 18 | Wt 192.5 lb

## 2018-11-02 VITALS — BP 123/79 | HR 75 | Resp 16

## 2018-11-02 DIAGNOSIS — Z95828 Presence of other vascular implants and grafts: Secondary | ICD-10-CM

## 2018-11-02 DIAGNOSIS — Z5112 Encounter for antineoplastic immunotherapy: Secondary | ICD-10-CM | POA: Diagnosis not present

## 2018-11-02 DIAGNOSIS — M109 Gout, unspecified: Secondary | ICD-10-CM | POA: Diagnosis not present

## 2018-11-02 DIAGNOSIS — C187 Malignant neoplasm of sigmoid colon: Secondary | ICD-10-CM

## 2018-11-02 DIAGNOSIS — N189 Chronic kidney disease, unspecified: Secondary | ICD-10-CM | POA: Diagnosis not present

## 2018-11-02 DIAGNOSIS — M7989 Other specified soft tissue disorders: Secondary | ICD-10-CM | POA: Diagnosis not present

## 2018-11-02 DIAGNOSIS — C787 Secondary malignant neoplasm of liver and intrahepatic bile duct: Secondary | ICD-10-CM | POA: Insufficient documentation

## 2018-11-02 DIAGNOSIS — Z5111 Encounter for antineoplastic chemotherapy: Secondary | ICD-10-CM | POA: Insufficient documentation

## 2018-11-02 DIAGNOSIS — Z79899 Other long term (current) drug therapy: Secondary | ICD-10-CM | POA: Diagnosis not present

## 2018-11-02 LAB — CBC WITH DIFFERENTIAL/PLATELET
Abs Immature Granulocytes: 0.05 10*3/uL (ref 0.00–0.07)
Basophils Absolute: 0 10*3/uL (ref 0.0–0.1)
Basophils Relative: 0 %
Eosinophils Absolute: 0 10*3/uL (ref 0.0–0.5)
Eosinophils Relative: 1 %
HCT: 28.3 % — ABNORMAL LOW (ref 39.0–52.0)
Hemoglobin: 8.8 g/dL — ABNORMAL LOW (ref 13.0–17.0)
Immature Granulocytes: 1 %
Lymphocytes Relative: 13 %
Lymphs Abs: 0.9 10*3/uL (ref 0.7–4.0)
MCH: 28.7 pg (ref 26.0–34.0)
MCHC: 31.1 g/dL (ref 30.0–36.0)
MCV: 92.2 fL (ref 80.0–100.0)
Monocytes Absolute: 0.3 10*3/uL (ref 0.1–1.0)
Monocytes Relative: 5 %
Neutro Abs: 5.7 10*3/uL (ref 1.7–7.7)
Neutrophils Relative %: 80 %
Platelets: 162 10*3/uL (ref 150–400)
RBC: 3.07 MIL/uL — ABNORMAL LOW (ref 4.22–5.81)
RDW: 15.4 % (ref 11.5–15.5)
WBC: 7 10*3/uL (ref 4.0–10.5)
nRBC: 0 % (ref 0.0–0.2)

## 2018-11-02 LAB — COMPREHENSIVE METABOLIC PANEL
ALT: 12 U/L (ref 0–44)
AST: 15 U/L (ref 15–41)
Albumin: 3.2 g/dL — ABNORMAL LOW (ref 3.5–5.0)
Alkaline Phosphatase: 74 U/L (ref 38–126)
Anion gap: 7 (ref 5–15)
BUN: 9 mg/dL (ref 8–23)
CO2: 25 mmol/L (ref 22–32)
Calcium: 8.9 mg/dL (ref 8.9–10.3)
Chloride: 109 mmol/L (ref 98–111)
Creatinine, Ser: 1 mg/dL (ref 0.61–1.24)
GFR calc Af Amer: >60 mL/min (ref >60)
GFR calc non Af Amer: >60 mL/min (ref >60)
Glucose, Bld: 80 mg/dL (ref 70–99)
Potassium: 3.7 mmol/L (ref 3.5–5.1)
Sodium: 141 mmol/L (ref 135–145)
Total Bilirubin: 0.6 mg/dL (ref 0.3–1.2)
Total Protein: 6 g/dL — ABNORMAL LOW (ref 6.5–8.1)

## 2018-11-02 MED ORDER — ATROPINE SULFATE 1 MG/ML IJ SOLN
INTRAMUSCULAR | Status: AC
  Filled 2018-11-02: qty 1

## 2018-11-02 MED ORDER — OXALIPLATIN CHEMO INJECTION 100 MG/20ML
67.0000 mg/m2 | Freq: Once | INTRAVENOUS | Status: AC
  Administered 2018-11-02: 150 mg via INTRAVENOUS
  Filled 2018-11-02: qty 10

## 2018-11-02 MED ORDER — SODIUM CHLORIDE 0.9% FLUSH
10.0000 mL | INTRAVENOUS | Status: DC | PRN
  Administered 2018-11-02: 10 mL
  Filled 2018-11-02: qty 10

## 2018-11-02 MED ORDER — DEXTROSE 5 % IV SOLN
Freq: Once | INTRAVENOUS | Status: AC
  Administered 2018-11-02: 10:00:00 via INTRAVENOUS

## 2018-11-02 MED ORDER — LEUCOVORIN CALCIUM INJECTION 350 MG
400.0000 mg/m2 | Freq: Once | INTRAVENOUS | Status: AC
  Administered 2018-11-02: 11:00:00 900 mg via INTRAVENOUS
  Filled 2018-11-02: qty 10

## 2018-11-02 MED ORDER — SODIUM CHLORIDE 0.9 % IV SOLN
Freq: Once | INTRAVENOUS | Status: AC
  Administered 2018-11-02: 10:00:00 via INTRAVENOUS
  Filled 2018-11-02: qty 5

## 2018-11-02 MED ORDER — SODIUM CHLORIDE 0.9 % IV SOLN
1920.0000 mg/m2 | INTRAVENOUS | Status: DC
  Administered 2018-11-02: 4300 mg via INTRAVENOUS
  Filled 2018-11-02: qty 86

## 2018-11-02 MED ORDER — OXALIPLATIN CHEMO INJECTION 100 MG/20ML: 68.0000 mg/m2 | Freq: Once | INTRAVENOUS | Status: DC

## 2018-11-02 MED ORDER — FLUOROURACIL CHEMO INJECTION 2.5 GM/50ML
320.0000 mg/m2 | Freq: Once | INTRAVENOUS | Status: AC
  Administered 2018-11-02: 14:00:00 700 mg via INTRAVENOUS
  Filled 2018-11-02: qty 14

## 2018-11-02 MED ORDER — ATROPINE SULFATE 1 MG/ML IJ SOLN
0.5000 mg | Freq: Once | INTRAMUSCULAR | Status: AC | PRN
  Administered 2018-11-02: 0.5 mg via INTRAVENOUS

## 2018-11-02 MED ORDER — SODIUM CHLORIDE 0.9 % IV SOLN
5.0000 mg/kg | Freq: Once | INTRAVENOUS | Status: AC
  Administered 2018-11-02: 500 mg via INTRAVENOUS
  Filled 2018-11-02: qty 16

## 2018-11-02 MED ORDER — PALONOSETRON HCL INJECTION 0.25 MG/5ML
0.2500 mg | Freq: Once | INTRAVENOUS | Status: AC
  Administered 2018-11-02: 0.25 mg via INTRAVENOUS
  Filled 2018-11-02: qty 5

## 2018-11-02 NOTE — Progress Notes (Signed)
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 Focal narrowing and wall thickening of the sigmoid colon is noted in this area most likely due to inflammation, but neoplasm cannot be excluded, and sigmoidoscopy is recommended for further evaluation.   03/20/2016 Procedure   Colonoscopy by Dr. Laural Golden- Malignant partially obstructing tumor in the distal sigmoid colon. Biopsied. - Incomplete exam.   03/23/2016 Pathology Results   Colon, biopsy, sigmoid mass - ADENOCARCINOMA.   03/23/2016 Procedure   Low anterior resection with transanal anastomosis   03/26/2016 Pathology Results   Colon, segmental resection for tumor, sigmoid - INVASIVE WELL DIFFERENTIATED ADENOCARCINOMA, SPANNING 5 CM IN GREATEST DIMENSION. - TUMOR INVADES THROUGH MUSCULARIS PROPRIA TO INVOLVE SUBSEROSAL SOFT TISSUES. - UNDERLYING MARKED ACUTE AND CHRONIC INFLAMMATION WITH GIANT CELL REACTION AND ABSCESS FORMATION EXTENDING TO THE SEROSAL SURFACE AND INVOLVING THE MESENTERIC TISSUE. - MARGINS ARE NEGATIVE FOR TUMOR. - FOURTEEN BENIGN LYMPH NODES WHICH DEMONSTRATE VARIABLE INVOLVEMENT WITH ACUTE AND CHRONIC INFLAMMATION WITH ASSOCIATED GIANT CELLS WITHOUT TUMOR IDENTIFIED (0/14).   04/08/2016 Cancer Staging   Cancer Staging Adenocarcinoma of sigmoid colon [facility]) Staging form: Colon and Rectum, AJCC 8th Edition - Pathologic stage from 04/01/2016: Stage IIA (pT3, pN0, cM0) - Signed by Baird Cancer, PA-C on 04/01/2016    07/09/2016 Imaging   CT abd/pelvis- No evidence of metastatic disease or  other acute findings.  Bilateral nonobstructing renal calculi and mild bilateral renal parenchymal scarring.  Stable mildly enlarged prostate gland and findings of chronic bladder outlet obstruction.   08/16/2018 -  Chemotherapy   The patient had palonosetron (ALOXI) injection 0.25 mg, 0.25 mg, Intravenous,  Once, 4 of 6 cycles Administration: 0.25 mg (08/16/2018), 0.25 mg (09/07/2018), 0.25 mg (10/19/2018), 0.25 mg (11/02/2018) pegfilgrastim (NEULASTA) injection 6 mg, 6 mg, Subcutaneous, Once, 4 of 6 cycles Administration: 6 mg (08/18/2018), 6 mg (09/09/2018), 6 mg (10/21/2018) irinotecan (CAMPTOSAR) 380 mg in sodium chloride 0.9 % 500 mL chemo infusion, 165 mg/m2 = 380 mg, Intravenous,  Once, 1 of 3 cycles Administration: 380 mg (08/16/2018) leucovorin 900 mg in dextrose 5 % 250 mL infusion, 400 mg/m2 = 900 mg, Intravenous,  Once, 4 of 6 cycles Administration: 900 mg (08/16/2018), 900 mg (09/07/2018), 900 mg (10/19/2018), 900 mg (11/02/2018) oxaliplatin (ELOXATIN) 190 mg in dextrose 5 % 500 mL chemo infusion, 85 mg/m2 = 190 mg, Intravenous,  Once, 4 of 6 cycles Dose modification: 68 mg/m2 (80 % of original dose 85 mg/m2, Cycle 2, Reason: Provider Judgment), 68 mg/m2 (80 % of original dose 85 mg/m2, Cycle 3, Reason: Provider Judgment) Administration: 190 mg (08/16/2018), 150 mg (09/07/2018), 150 mg (10/19/2018), 150 mg (11/02/2018) fluorouracil (ADRUCIL) chemo injection 700 mg, 320 mg/m2 = 700 mg (100 % of original dose 320 mg/m2), Intravenous,  Once, 1 of 3 cycles Dose modification: 320 mg/m2 (original dose 320 mg/m2, Cycle 4) Administration: 700 mg (11/02/2018) fosaprepitant (EMEND) 150 mg, dexamethasone (DECADRON) 12 mg in sodium chloride 0.9 % 145 mL IVPB, , Intravenous,  Once, 4 of 6 cycles Administration:  (08/16/2018),  (09/07/2018),  (10/19/2018),  (11/02/2018) fluorouracil (ADRUCIL) 5,400 mg in sodium chloride 0.9 %  142 mL chemo infusion, 2,400 mg/m2 = 5,400 mg, Intravenous, 1 Day/Dose, 4 of 6 cycles Dose  modification: 1,920 mg/m2 (80 % of original dose 2,400 mg/m2, Cycle 3, Reason: Provider Judgment) Administration: 5,400 mg (08/16/2018), 5,400 mg (09/07/2018), 4,300 mg (10/19/2018), 4,300 mg (11/02/2018) bevacizumab-awwb (MVASI) 500 mg in sodium chloride 0.9 % 100 mL chemo infusion, 5 mg/kg = 500 mg (100 % of original dose 5 mg/kg), Intravenous,  Once, 4 of 6 cycles Dose modification: 5 mg/kg (original dose 5 mg/kg, Cycle 1), 5 mg/kg (original dose 5 mg/kg, Cycle 2) Administration: 500 mg (08/16/2018), 500 mg (09/07/2018), 500 mg (10/19/2018), 500 mg (11/02/2018)  for chemotherapy treatment.       CANCER STAGING: Cancer Staging Adenocarcinoma of sigmoid colon [facility]) Staging form: Colon and Rectum, AJCC 8th Edition - Pathologic stage from 04/01/2016: Stage IIA (pT3, pN0, cM0) - Signed by Baird Cancer, PA-C on 04/01/2016    INTERVAL HISTORY:  [name] 63 y.o. male seen for follow-up of metastatic sigmoid colon cancer to the liver.  Last chemotherapy was on 10/19/2018.  He is accompanied by his wife today.  Did not experience any major problems like nausea vomiting or diarrhea.  Appetite and energy levels are 75%.  He did quite well after last cycle of treatment.  No tingling or numbness in the extremities reported.  He is taking donepezil at bedtime.  He is also continuing allopurinol.  Mild swelling of feet was reported.    REVIEW OF SYSTEMS:  Review of Systems  Cardiovascular: Positive for leg swelling.  All other systems reviewed and are negative.    PAST MEDICAL/SURGICAL HISTORY:  Past Medical History:  Diagnosis Date  . 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 Carpal tunnel syndrome 05/02/2014   Bilateral  . Cervical disc disorder with radiculopathy of cervical region 05/02/2014   Left C8  . Diabetes mellitus without complication ([place])   . Hypertension   . Port-A-Cath in place 08/10/2018   Past Surgical History:  Procedure Laterality Date  . BIOPSY  03/20/2016   Procedure:  BIOPSY;  Surgeon: Rogene Houston, MD;  Location[facility];  Service: Endoscopy;;  colon  . BOWEL RESECTION N/A 03/23/2016   Procedure: LOW ANTERIOR RESECTION;  Surgeon: Aviva Signs, MD;  Location: [facility] ORS;  Service: General;  Laterality: N/A;  . COLONOSCOPY N/A 03/20/2016   Procedure: COLONOSCOPY;  Surgeon: Rogene Houston, MD;  Location: [facility] ENDO SU[facility];  Service: Endoscopy;  Laterality: N/A;  730  . COLONOSCOPY N/A 07/10/2016   Procedure: COLONOSCOPY;  Surgeon: Rogene Houston, MD;  Location: [facility] END[facility];  Service: Endoscopy;  Laterality: N/A;  1040  . NO PAST SURGERIES    . POLYPECTOMY  07/10/2016   Procedure: POLYPECTOMY;  Surgeon: Rogene Houston, MD;  Location: [facility];  Service: Endoscopy;;  colon  . PORTACATH PLACEMENT Left 08/05/2018   Procedure: INSERTION PORT-A-CATH;  Surgeon: Aviva Signs, MD;  Location: [facility] ORS;  Service: General;  Laterality: Left;  . TONSILLECTOMY       SOCIAL HISTORY:  Social History   Socioeconomic History  . Marital status: Married    Spouse name: Not on file  . Number of children: Not on file  . Years of education: Not on file  . Highest education level: Not on file  Occupational History  . Not on file  Social Needs  . Financial resource strain: Not on file  . Food insecurity    Worry: Not on file  Inability: Not on file  . Transportation needs    Medical: Not on file    Non-medical: Not on file  Tobacco Use  . Smoking status: Never Smoker  . Smokeless tobacco: Never Used  Substance and Sexual Activity  . Alcohol use: Yes    Comment: rarely  . Drug use: No  . Sexual activity: Not on file  Lifestyle  . Physical activity    Days per week: Not on file    Minutes per session: Not on file  . Stress: Not on file  Relationships  . Social Herbalist on phone: Not on file    Gets together: Not on file    Attends religious service: Not on file    Active member of club or organization: Not on file    Attends meetings of  clubs or organizations: Not on file    Relationship status: Not on file  . Intimate partner violence    Fear of current or ex partner: Not on file    Emotionally abused: Not on file    Physically abused: Not on file    Forced sexual activity: Not on file  Other Topics Concern  . Not on file  Social History Narrative  . Not on file    FAMILY HISTORY:  Family History  Problem Relation Age of Onset  . COPD Mother   . Melanoma Father     CURRENT MEDICATIONS:  Outpatient Encounter Medications as of 11/02/2018  Medication Sig  . allopurinol (ZYLOPRIM) 300 MG tablet Take 1 tablet (300 mg total) by mouth daily.  Marland Kitchen amLODipine (NORVASC) 10 MG tablet Take 10 mg by mouth daily.  . Bevacizumab (AVASTIN IV) Inject into the vein every 14 (fourteen) days.  Marland Kitchen donepezil (ARICEPT) 5 MG tablet Take 1 tablet (5 mg total) by mouth at bedtime.  Marland Kitchen FLUOROURACIL IV Inject into the vein every 14 (fourteen) days.  . insulin glargine (LANTUS) 100 UNIT/ML injection Inject 30 Units into the skin at bedtime.   . IRINOTECAN HCL IV Inject into the vein every 14 (fourteen) days.  Marland Kitchen LEUCOVORIN CALCIUM IV Inject into the vein every 14 (fourteen) days.  . metoprolol succinate (TOPROL-XL) 50 MG 24 hr tablet Take 50 mg by mouth daily. Take with or immediately following a meal.  . OXALIPLATIN IV Inject into the vein every 14 (fourteen) days.  . potassium chloride SA (K-DUR) 20 MEQ tablet Take 1 tablet (20 mEq total) by mouth daily.  Marland Kitchen dextrose 5 % SOLN 1,000 mL with fluorouracil 5 GM/100ML SOLN Inject into the vein over 48 hr.  . diphenoxylate-atropine (LOMOTIL) 2.5-0.025 MG tablet Take 1 tablet by mouth 4 (four) times daily as needed for diarrhea or loose stools. (Patient not taking: Reported on 10/26/2018)  . HYDROcodone-acetaminophen (NORCO) 5-325 MG tablet Take 1 tablet by mouth every 4 (four) hours as needed for moderate pain. (Patient not taking: Reported on 10/26/2018)  . lidocaine-prilocaine (EMLA) cream Apply a  small amount to port a cath site and cover with plastic wrap 1 hour prior to chemotherapy appointments (Patient not taking: Reported on 10/26/2018)  . loperamide (IMODIUM A-D) 2 MG tablet Take 2 at onset of diarrhea, then 1 after each watery bowel movement. (Patient not taking: Reported on 10/26/2018)  . magic mouthwash w/lidocaine SOLN Take 5 mLs by mouth 4 (four) times daily as needed for mouth pain. (Patient not taking: Reported on 10/26/2018)  . prochlorperazine (COMPAZINE) 10 MG tablet Take 1 tablet (10 mg total) by  mouth every 6 (six) hours as needed (Nausea or vomiting). (Patient not taking: Reported on 11/02/2018)  . scopolamine (TRANSDERM-SCOP) 1 MG/3DAYS Place 1 patch (1.5 mg total) onto the skin every 3 (three) days. (Patient not taking: Reported on 10/26/2018)  . [DISCONTINUED] colchicine 0.6 MG tablet Take 0.5 tablets (0.3 mg total) by mouth 2 (two) times daily.   No facility-administered encounter medications on file as of 11/02/2018.     ALLERGIES:  Allergies  Allergen Reactions  . Penicillin G Nausea And Vomiting     PHYSICAL EXAM:  ECOG Performance status: 1  Vital signs: Blood pressure is 130/75.  Pulse rate is 80.  Respiratory is 18.  Temperature 98.  Physical Exam Cardiovascular:     Rate and Rhythm: Normal rate and regular rhythm.     Heart sounds: Normal heart sounds.  Pulmonary:     Effort: Pulmonary effort is normal.     Breath sounds: Normal breath sounds.  Abdominal:     General: There is no distension.     Palpations: Abdomen is soft. There is no mass.  Musculoskeletal: Normal range of motion.  Skin:    General: Skin is warm and dry.  Neurological:     Mental Status: He is alert and oriented to person, place, and time. Mental status is at baseline.  Psychiatric:        Mood and Affect: Mood normal.        Behavior: Behavior normal.        Thought Content: Thought content normal.        Judgment: Judgment normal.      LABORATORY DATA:  I have  reviewed the labs as listed.  CBC    Component Value Date/Time   WBC 7.0 11/02/2018 0810   RBC 3.07 (L) 11/02/2018 0810   HGB 8.8 (L) 11/02/2018 0810   HCT 28.3 (L) 11/02/2018 0810   PLT 162 11/02/2018 0810   MCV 92.2 11/02/2018 0810   MCH 28.7 11/02/2018 0810   MCHC 31.1 11/02/2018 0810   RDW 15.4 11/02/2018 0810   LYMPHSABS 0.9 11/02/2018 0810   MONOABS 0.3 11/02/2018 0810   EOSABS 0.0 11/02/2018 0810   BASOSABS 0.0 11/02/2018 0810   CMP Latest Ref Rng & Units 11/02/2018 10/26/2018 10/19/2018  Glucose 70 - 99 mg/dL 80 144(H) 160(H)  BUN 8 - 23 mg/dL 9 12 18   Creatinine 0.61 - 1.24 mg/dL 1.00 1.34(H) 1.12  Sodium 135 - 145 mmol/L 141 141 136  Potassium 3.5 - 5.1 mmol/L 3.7 4.1 3.6  Chloride 98 - 111 mmol/L 109 109 107  CO2 22 - 32 mmol/L 25 23 22   Calcium 8.9 - 10.3 mg/dL 8.9 9.2 8.8(L)  Total Protein 6.5 - 8.1 g/dL 6.0(L) 6.0(L) 5.8(L)  Total Bilirubin 0.3 - 1.2 mg/dL 0.6 0.6 0.9  Alkaline Phos 38 - 126 U/L 74 116 67  AST 15 - 41 U/L 15 14(L) 14(L)  ALT 0 - 44 U/L 12 12 16      I have independently reviewed scans and discussed with the patient.  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 - Cycle 1 of FOLFOXIRI and bevacizumab on 08/16/2018. -Cycle 1 was tolerated very poorly with severe nausea and diarrhea and worsening renal function. 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 -Cycle 2 of FOLFOX on 09/07/2018 with oxaliplatin dose reduced by 20%. -  He had watery diarrhea and some soreness in the mouth along with nausea after cycle 2.  Cycle 3 was held on 09/22/2018 secondary to gout of right hand middle finger and right knee. - Cycle 3 of FOLFOX with 20% dose reduction of infusional 5-FU on 10/19/2018. - He tolerated the last treatment very well.  His appointment with Dr. Crisoforo Oxford at [facility] was postponed  to November. - I will proceed with cycle 4 with the same dose levels of cycle 3.  However I will add infusional 5-FU as he is not receiving Irinotecan anymore.  I will add it at lower dose of 320 mg per metered square. -I will see him back in 1 week for follow-up.  2.  Gout: -He will continue allopurinol.  3.  Cognitive dysfunction: - He has difficulty understanding the directions to take medications.  Wife is present at bedside today.  She reported that he forgot that he had the car which he bought 20 years ago.  He is also forgetful of minor things at home. - Donepezil 5 mg daily was started around 10/24/2018.  I plan to titrated up in 4 to 6 weeks.  4.  CKD: -Creatinine improved to 1.0.   Total time spent is 25 minutes with more than 50% of the time spent face-to-face discussing treatment plan, counseling and coordination of care.  Orders placed this encounter:  Orders Placed This Encounter  Procedures  . CBC with Differential/Platelet  . Comprehensive metabolic panel  . CEA  . Magnesium  . Phosphorus  . Uric acid     Derek Jack, MD [place] [phone]

## 2018-11-02 NOTE — Assessment & Plan Note (Signed)
1.  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 - Cycle 1 of FOLFOXIRI and bevacizumab on 08/16/2018. -Cycle 1 was tolerated very poorly with severe nausea and diarrhea and worsening renal function. 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 -Cycle 2 of FOLFOX on 09/07/2018 with oxaliplatin dose reduced by 20%. - He had watery diarrhea and some soreness in the mouth along with nausea after cycle 2.  Cycle 3 was held on 09/22/2018 secondary to gout of right hand middle finger and right knee. - Cycle 3 of FOLFOX with 20% dose reduction of infusional 5-FU on 10/19/2018. - He tolerated the last treatment very well.  His appointment with Dr. Crisoforo Oxford at [facility] was postponed to November. - I will proceed with cycle 4 with the same dose levels of cycle 3.  However I will add infusional 5-FU as he is not receiving Irinotecan anymore.  I will add it at lower dose of 320 mg per metered square. -I will see him back in 1 week for follow-up.  2.  Gout: -He will continue allopurinol.  3.  Cognitive dysfunction: - He has difficulty understanding the directions to take medications.  Wife is present at bedside today.  She reported that he forgot that he had the car which he bought 20 years ago.  He is also forgetful of minor things at home. - Donepezil 5 mg daily was started around 10/24/2018.  I plan to titrated up in 4 to 6 weeks.  4.  CKD: -Creatinine improved to 1.0.

## 2018-11-02 NOTE — Progress Notes (Signed)
Labs reviewed with MD today at office visit. . Proceed with treatment per MD.    Treatment given per orders. Patient tolerated it well without problems. Vitals stable and discharged home from clinic ambulatory. Follow up as scheduled.  

## 2018-11-02 NOTE — Patient Instructions (Signed)
[facility] Discharge Instructions for Patients Receiving Chemotherapy  Today you received the following chemotherapy agents   To help prevent nausea and vomiting after your treatment, we encourage you to take your nausea medication   If you develop nausea and vomiting that is not controlled by your nausea medication, call the clinic.   BELOW ARE SYMPTOMS THAT SHOULD BE REPORTED IMMEDIATELY:  *FEVER GREATER THAN 100.5 F  *CHILLS WITH OR WITHOUT FEVER  NAUSEA AND VOMITING THAT IS NOT CONTROLLED WITH YOUR NAUSEA MEDICATION  *UNUSUAL SHORTNESS OF BREATH  *UNUSUAL BRUISING OR BLEEDING  TENDERNESS IN MOUTH AND THROAT WITH OR WITHOUT PRESENCE OF ULCERS  *URINARY PROBLEMS  *BOWEL PROBLEMS  UNUSUAL RASH Items with * indicate a potential emergency and should be followed up as soon as possible.  Feel free to call the clinic should you have any questions or concerns. The clinic phone number is [phone].  Please show the CHEMO ALERT CARD at check-in to the Emergency Department and triage nurse.   

## 2018-11-02 NOTE — Progress Notes (Signed)
11/02/2018 Dr. Raliegh Ip requested to add in 5-FU bolus of 320 mg/m2 to this patient's treatment plan. Orders added for this cycle and the two next cycles.  Eddie Candle, PharmD

## 2018-11-02 NOTE — Patient Instructions (Signed)
[facility] at [facility] Discharge Instructions  You were seen today by Dr. Katragadda. He went over your recent lab results. He will see you back in 2 weeks for labs, treatment and follow up.   Thank you for choosing [facility] at [facility] to provide your oncology and hematology care.  To afford each patient quality time with our provider, please arrive at least 15 minutes before your scheduled appointment time.   If you have a lab appointment with the Cancer Center please come in thru the  Main Entrance and check in at the main information desk  You need to re-schedule your appointment should you arrive 10 or more minutes late.  We strive to give you quality time with our providers, and arriving late affects you and other patients whose appointments are after yours.  Also, if you no show three or more times for appointments you may be dismissed from the clinic at the providers discretion.     Again, thank you for choosing [facility].  Our hope is that these requests will decrease the amount of time that you wait before being seen by our physicians.       _____________________________________________________________  Should you have questions after your visit to [facility], please contact our office at [phone] between the hours of 8:00 a.m. and 4:30 p.m.  Voicemails left after 4:00 p.m. will not be returned until the following business day.  For prescription refill requests, have your pharmacy contact our office and allow 72 hours.    Cancer Center Support Programs:   > Cancer Support Group  2nd Tuesday of the month 1pm-2pm, Journey Room    

## 2018-11-04 ENCOUNTER — Other Ambulatory Visit: Payer: Self-pay

## 2018-11-04 ENCOUNTER — Inpatient Hospital Stay (HOSPITAL_COMMUNITY): Payer: 59

## 2018-11-04 VITALS — BP 131/77 | HR 67 | Temp 96.8°F | Resp 18

## 2018-11-04 DIAGNOSIS — Z5112 Encounter for antineoplastic immunotherapy: Secondary | ICD-10-CM | POA: Diagnosis not present

## 2018-11-04 DIAGNOSIS — Z95828 Presence of other vascular implants and grafts: Secondary | ICD-10-CM

## 2018-11-04 DIAGNOSIS — C187 Malignant neoplasm of sigmoid colon: Secondary | ICD-10-CM

## 2018-11-04 MED ORDER — HEPARIN SOD (PORK) LOCK FLUSH 100 UNIT/ML IV SOLN
500.0000 [IU] | Freq: Once | INTRAVENOUS | Status: AC | PRN
  Administered 2018-11-04: 13:00:00 500 [IU]

## 2018-11-04 MED ORDER — PEGFILGRASTIM INJECTION 6 MG/0.6ML ~~LOC~~
6.0000 mg | PREFILLED_SYRINGE | Freq: Once | SUBCUTANEOUS | Status: AC
  Administered 2018-11-04: 6 mg via SUBCUTANEOUS

## 2018-11-04 MED ORDER — PEGFILGRASTIM INJECTION 6 MG/0.6ML ~~LOC~~
PREFILLED_SYRINGE | SUBCUTANEOUS | Status: AC
  Filled 2018-11-04: qty 0.6

## 2018-11-04 MED ORDER — SODIUM CHLORIDE 0.9% FLUSH
10.0000 mL | INTRAVENOUS | Status: DC | PRN
  Administered 2018-11-04: 10 mL
  Filled 2018-11-04: qty 10

## 2018-11-04 NOTE — Patient Instructions (Signed)
[facility] at [facility]  Discharge Instructions:   _______________________________________________________________  Thank you for choosing [facility] at [facility] to provide your oncology and hematology care.  To afford each patient quality time with our providers, please arrive at least 15 minutes before your scheduled appointment.  You need to re-schedule your appointment if you arrive 10 or more minutes late.  We strive to give you quality time with our providers, and arriving late affects you and other patients whose appointments are after yours.  Also, if you no show three or more times for appointments you may be dismissed from the clinic.  Again, thank you for choosing  Cancer Center at [facility]. Our hope is that these requests will allow you access to exceptional care and in a timely manner. _______________________________________________________________  If you have questions after your visit, please contact our office at [phone] between the hours of 8:30 a.m. and 5:00 p.m. Voicemails left after 4:30 p.m. will not be returned until the following business day. _______________________________________________________________  For prescription refill requests, have your pharmacy contact our office. _______________________________________________________________  Recommendations made by the consultant and any test results will be sent to your referring physician. _______________________________________________________________ 

## 2018-11-09 ENCOUNTER — Inpatient Hospital Stay (HOSPITAL_COMMUNITY): Payer: 59

## 2018-11-09 ENCOUNTER — Other Ambulatory Visit: Payer: Self-pay

## 2018-11-09 ENCOUNTER — Encounter (HOSPITAL_COMMUNITY): Payer: Self-pay | Admitting: Hematology

## 2018-11-09 ENCOUNTER — Inpatient Hospital Stay (HOSPITAL_BASED_OUTPATIENT_CLINIC_OR_DEPARTMENT_OTHER): Payer: 59 | Admitting: Hematology

## 2018-11-09 VITALS — BP 139/95 | HR 78 | Temp 96.9°F | Resp 18 | Wt 189.0 lb

## 2018-11-09 DIAGNOSIS — C187 Malignant neoplasm of sigmoid colon: Secondary | ICD-10-CM

## 2018-11-09 DIAGNOSIS — Z0000 Encounter for general adult medical examination without abnormal findings: Secondary | ICD-10-CM | POA: Diagnosis not present

## 2018-11-09 DIAGNOSIS — Z5112 Encounter for antineoplastic immunotherapy: Secondary | ICD-10-CM | POA: Diagnosis not present

## 2018-11-09 LAB — CBC WITH DIFFERENTIAL/PLATELET
Abs Immature Granulocytes: 0.15 10*3/uL — ABNORMAL HIGH (ref 0.00–0.07)
Basophils Absolute: 0 10*3/uL (ref 0.0–0.1)
Basophils Relative: 0 %
Eosinophils Absolute: 0.2 10*3/uL (ref 0.0–0.5)
Eosinophils Relative: 1 %
HCT: 31.3 % — ABNORMAL LOW (ref 39.0–52.0)
Hemoglobin: 9.6 g/dL — ABNORMAL LOW (ref 13.0–17.0)
Immature Granulocytes: 1 %
Lymphocytes Relative: 10 %
Lymphs Abs: 1.6 10*3/uL (ref 0.7–4.0)
MCH: 28.7 pg (ref 26.0–34.0)
MCHC: 30.7 g/dL (ref 30.0–36.0)
MCV: 93.4 fL (ref 80.0–100.0)
Monocytes Absolute: 0.9 10*3/uL (ref 0.1–1.0)
Monocytes Relative: 6 %
Neutro Abs: 12.6 10*3/uL — ABNORMAL HIGH (ref 1.7–7.7)
Neutrophils Relative %: 82 %
Platelets: 193 10*3/uL (ref 150–400)
RBC: 3.35 MIL/uL — ABNORMAL LOW (ref 4.22–5.81)
RDW: 15.4 % (ref 11.5–15.5)
WBC: 15.4 10*3/uL — ABNORMAL HIGH (ref 4.0–10.5)
nRBC: 0 % (ref 0.0–0.2)

## 2018-11-09 LAB — PHOSPHORUS: Phosphorus: 2.9 mg/dL (ref 2.5–4.6)

## 2018-11-09 LAB — COMPREHENSIVE METABOLIC PANEL
ALT: 13 U/L (ref 0–44)
AST: 17 U/L (ref 15–41)
Albumin: 3.6 g/dL (ref 3.5–5.0)
Alkaline Phosphatase: 160 U/L — ABNORMAL HIGH (ref 38–126)
Anion gap: 11 (ref 5–15)
BUN: 10 mg/dL (ref 8–23)
CO2: 24 mmol/L (ref 22–32)
Calcium: 9.5 mg/dL (ref 8.9–10.3)
Chloride: 106 mmol/L (ref 98–111)
Creatinine, Ser: 1.16 mg/dL (ref 0.61–1.24)
GFR calc Af Amer: >60 mL/min (ref >60)
GFR calc non Af Amer: >60 mL/min (ref >60)
Glucose, Bld: 178 mg/dL — ABNORMAL HIGH (ref 70–99)
Potassium: 4.5 mmol/L (ref 3.5–5.1)
Sodium: 141 mmol/L (ref 135–145)
Total Bilirubin: 0.3 mg/dL (ref 0.3–1.2)
Total Protein: 6.4 g/dL — ABNORMAL LOW (ref 6.5–8.1)

## 2018-11-09 LAB — URIC ACID: Uric Acid, Serum: 3.6 mg/dL — ABNORMAL LOW (ref 3.7–8.6)

## 2018-11-09 LAB — MAGNESIUM: Magnesium: 1.7 mg/dL (ref 1.7–2.4)

## 2018-11-09 MED ORDER — METHYLPREDNISOLONE 4 MG PO TBPK: ORAL_TABLET | ORAL | 0 refills | Status: DC

## 2018-11-09 MED ORDER — INFLUENZA VAC SPLIT QUAD 0.5 ML IM SUSY
0.5000 mL | PREFILLED_SYRINGE | Freq: Once | INTRAMUSCULAR | Status: AC
  Administered 2018-11-09: 0.5 mL via INTRAMUSCULAR

## 2018-11-09 MED ORDER — DONEPEZIL HCL 10 MG PO TABS: 10.0000 mg | ORAL_TABLET | Freq: Every day | ORAL | 6 refills | Status: DC

## 2018-11-09 MED ORDER — INFLUENZA VAC SPLIT QUAD 0.5 ML IM SUSY
PREFILLED_SYRINGE | INTRAMUSCULAR | Status: AC
  Filled 2018-11-09: qty 0.5

## 2018-11-09 NOTE — Patient Instructions (Addendum)
[place] at [facility] Discharge Instructions  You were seen today by Dr. Delton Coombes. He went over your recent lab results. He will send in an increased dose of the Aricept as well as a steroid to help with the swelling your finger. He will see you back in 1 week for labs and follow up.   Thank you for choosing [place] at [facility] to provide your oncology and hematology care.  To afford each patient quality time with our provider, please arrive at least 15 minutes before your scheduled appointment time.   If you have a lab appointment with the [facility] please come in thru the  Main Entrance and check in at the main information desk  You need to re-schedule your appointment should you arrive 10 or more minutes late.  We strive to give you quality time with our providers, and arriving late affects you and other patients whose appointments are after yours.  Also, if you no show three or more times for appointments you may be dismissed from the clinic at the providers discretion.     Again, thank you for choosing [facility].  Our hope is that these requests will decrease the amount of time that you wait before being seen by our physicians.       _____________________________________________________________  Should you have questions after your visit to [facility], please contact our office at [phone] between the hours of 8:00 a.m. and 4:30 p.m.  Voicemails left after 4:00 p.m. will not be returned until the following business day.  For prescription refill requests, have your pharmacy contact our office and allow 72 hours.    Cancer Center Support Programs:   > Cancer Support Group  2nd Tuesday of the month 1pm-2pm, Journey Room

## 2018-11-09 NOTE — Assessment & Plan Note (Signed)
1.  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 - Cycle 1 of FOLFOXIRI and bevacizumab on 08/16/2018. -Cycle 1 was tolerated very poorly with severe nausea and diarrhea and worsening renal function. 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 -Cycle 2 of FOLFOX on 09/07/2018 with oxaliplatin dose reduced by 20%. - He had watery diarrhea and some soreness in the mouth along with nausea after cycle 2.  Cycle 3 was held on 09/22/2018 secondary to gout of right hand middle finger and right knee. - Cycle 3 of FOLFOX with 20% dose reduction of infusional 5-FU on 10/19/2018. - Cycle 4 on 11/02/2018.  He has tolerated it reasonably well.  He lost 3 pounds.  Denied any nausea or vomiting.  He had diarrhea for couple of days.  Appetite and energy levels are 75%.  We will see him back in 1 week to initiate cycle 5.  I will plan to repeat scans after cycle 6.  2.  Gout: -He is continuing allopurinol.  Uric acid is within normal limits.  However he has redness and pain of the middle finger joint. -I will give him Medrol Dosepak.  3.  Cognitive dysfunction: - He had developed memory problems.  Aricept 5 mg started around 10/24/2018. - His wife has noticed mild improvement.  We will increase it to 10 mg daily.  4.  CKD: -Creatinine is 1.16 today.

## 2018-11-09 NOTE — Progress Notes (Signed)
Troy Smith, [place] [id number]   CLINIC:  Medical Oncology/Hematology  PCP:  Troy Smith, [place] [id number] [phone]   REASON FOR VISIT: Follow-up for sigmoid colon Smith mets to the liver  CURRENT THERAPY: FOLFOX  BRIEF ONCOLOGIC HISTORY:  Oncology History  Adenocarcinoma of sigmoid colon ([place])  11/20/2015 Imaging   CT abd/pelvis- Focal sigmoid diverticulitis is noted without abscess formation. Focal narrowing and wall thickening of the sigmoid colon is noted in this area most likely due to inflammation, but neoplasm cannot be excluded, and sigmoidoscopy is recommended for further evaluation.   03/20/2016 Procedure   Colonoscopy by Dr. Laural Smith- Malignant partially obstructing tumor in the distal sigmoid colon. Biopsied. - Incomplete exam.   03/23/2016 Pathology Results   Colon, biopsy, sigmoid mass - ADENOCARCINOMA.   03/23/2016 Procedure   Low anterior resection with transanal anastomosis   03/26/2016 Pathology Results   Colon, segmental resection for tumor, sigmoid - INVASIVE WELL DIFFERENTIATED ADENOCARCINOMA, SPANNING 5 CM IN GREATEST DIMENSION. - TUMOR INVADES THROUGH MUSCULARIS PROPRIA TO INVOLVE SUBSEROSAL SOFT TISSUES. - UNDERLYING MARKED ACUTE AND CHRONIC INFLAMMATION WITH GIANT CELL REACTION AND ABSCESS FORMATION EXTENDING TO THE SEROSAL SURFACE AND INVOLVING THE MESENTERIC TISSUE. - MARGINS ARE NEGATIVE FOR TUMOR. - FOURTEEN BENIGN LYMPH NODES WHICH DEMONSTRATE VARIABLE INVOLVEMENT WITH ACUTE AND CHRONIC INFLAMMATION WITH ASSOCIATED GIANT CELLS WITHOUT TUMOR IDENTIFIED (0/14).   04/08/2016 Smith Staging   Smith Staging Adenocarcinoma of sigmoid colon [facility]) Staging form: Colon and Rectum, AJCC 8th Edition - Pathologic stage from Smith: Stage IIA (pT3, pN0, cM0) - Signed by Troy Smith    07/09/2016 Imaging   CT abd/pelvis- No evidence of metastatic disease or  other acute findings.  Bilateral nonobstructing renal calculi and mild bilateral renal parenchymal scarring.  Stable mildly enlarged prostate gland and findings of chronic bladder outlet obstruction.   08/16/2018 -  Chemotherapy   The patient had palonosetron (ALOXI) injection 0.25 mg, 0.25 mg, Intravenous,  Once, 4 of 6 cycles Administration: 0.25 mg (08/16/2018), 0.25 mg (09/07/2018), 0.25 mg (10/19/2018), 0.25 mg (11/02/2018) pegfilgrastim (NEULASTA) injection 6 mg, 6 mg, Subcutaneous, Once, 4 of 6 cycles Administration: 6 mg (08/18/2018), 6 mg (09/09/2018), 6 mg (10/21/2018), 6 mg (11/04/2018) irinotecan (CAMPTOSAR) 380 mg in sodium chloride 0.9 % 500 mL chemo infusion, 165 mg/m2 = 380 mg, Intravenous,  Once, 1 of 3 cycles Administration: 380 mg (08/16/2018) leucovorin 900 mg in dextrose 5 % 250 mL infusion, 400 mg/m2 = 900 mg, Intravenous,  Once, 4 of 6 cycles Administration: 900 mg (08/16/2018), 900 mg (09/07/2018), 900 mg (10/19/2018), 900 mg (11/02/2018) oxaliplatin (ELOXATIN) 190 mg in dextrose 5 % 500 mL chemo infusion, 85 mg/m2 = 190 mg, Intravenous,  Once, 4 of 6 cycles Dose modification: 68 mg/m2 (80 % of original dose 85 mg/m2, Cycle 2, Reason: Provider Judgment), 68 mg/m2 (80 % of original dose 85 mg/m2, Cycle 3, Reason: Provider Judgment) Administration: 190 mg (08/16/2018), 150 mg (09/07/2018), 150 mg (10/19/2018), 150 mg (11/02/2018) fluorouracil (ADRUCIL) chemo injection 700 mg, 320 mg/m2 = 700 mg (100 % of original dose 320 mg/m2), Intravenous,  Once, 1 of 3 cycles Dose modification: 320 mg/m2 (original dose 320 mg/m2, Cycle 4) Administration: 700 mg (11/02/2018) fosaprepitant (EMEND) 150 mg, dexamethasone (DECADRON) 12 mg in sodium chloride 0.9 % 145 mL IVPB, , Intravenous,  Once, 4 of 6 cycles Administration:  (08/16/2018),  (09/07/2018),  (10/19/2018),  (11/02/2018) fluorouracil (ADRUCIL) 5,400 mg in sodium  chloride 0.9 % 142 mL chemo infusion, 2,400 mg/m2 = 5,400 mg, Intravenous, 1 Day/Dose, 4 of 6  cycles Dose modification: 1,920 mg/m2 (80 % of original dose 2,400 mg/m2, Cycle 3, Reason: Provider Judgment) Administration: 5,400 mg (08/16/2018), 5,400 mg (09/07/2018), 4,300 mg (10/19/2018), 4,300 mg (11/02/2018) bevacizumab-awwb (MVASI) 500 mg in sodium chloride 0.9 % 100 mL chemo infusion, 5 mg/kg = 500 mg (100 % of original dose 5 mg/kg), Intravenous,  Once, 4 of 6 cycles Dose modification: 5 mg/kg (original dose 5 mg/kg, Cycle 1), 5 mg/kg (original dose 5 mg/kg, Cycle 2) Administration: 500 mg (08/16/2018), 500 mg (09/07/2018), 500 mg (10/19/2018), 500 mg (11/02/2018)  for chemotherapy treatment.       Smith STAGING: Smith Staging Adenocarcinoma of sigmoid colon [facility]) Staging form: Colon and Rectum, AJCC 8th Edition - Pathologic stage from Smith: Stage IIA (pT3, pN0, cM0) - Signed by Troy Smith    INTERVAL HISTORY:  Troy Smith 63 y.o. male seen for follow-up of metastatic sigmoid colon Smith to the liver.  He received cycle 4 on 11/02/2018.  He had couple of days of diarrhea which resolved.  He complained of erythema and pain of the middle finger joint since yesterday.  Right knee joint has not swollen.  Numbness in the feet has been stable.  Denies any nausea or vomiting.  Lost about 3 pounds since last 1 week.    REVIEW OF SYSTEMS:  Review of Systems  Cardiovascular: Positive for leg swelling.  All other systems reviewed and are negative.    PAST MEDICAL/SURGICAL HISTORY:  Past Medical History:  Diagnosis Date  . Adenocarcinoma of sigmoid colon ([place]) 03/23/2016  . Carpal tunnel syndrome 05/02/2014   Bilateral  . Cervical disc disorder with radiculopathy of cervical region 05/02/2014   Left C8  . Diabetes mellitus without complication ([place])   . Hypertension   . Port-A-Cath in place 08/10/2018   Past Surgical History:  Procedure Laterality Date  . BIOPSY  03/20/2016   Procedure: BIOPSY;  Surgeon: Rogene Houston, MD;  Location: [facility] END[facility];   Service: Endoscopy;;  colon  . BOWEL RESECTION N/A 03/23/2016   Procedure: LOW ANTERIOR RESECTION;  Surgeon: Aviva Signs, MD;  Location: [facility] ORS;  Service: General;  Laterality: N/A;  . COLONOSCOPY N/A 03/20/2016   Procedure: COLONOSCOPY;  Surgeon: Rogene Houston, MD;  Location: [facility] ENDO SUITE;  Service: Endoscopy;  Laterality: N/A;  730  . COLONOSCOPY N/A 07/10/2016   Procedure: COLONOSCOPY;  Surgeon: Rogene Houston, MD;  Location: [facility] ENDO SUITE;  Service: Endoscopy;  Laterality: N/A;  1040  . NO PAST SURGERIES    . POLYPECTOMY  07/10/2016   Procedure: POLYPECTOMY;  Surgeon: Rogene Houston, MD;  Location: [facility] END[facility];  Service: Endoscopy;;  colon  . PORTACATH PLACEMENT Left 08/05/2018   Procedure: INSERTION PORT-A-CATH;  Surgeon: Aviva Signs, MD;  Location: [facility] ORS;  Service: General;  Laterality: Left;  . TONSILLECTOMY       SOCIAL HISTORY:  Social History   Socioeconomic History  . Marital status: Married    Spouse name: Not on file  . Number of children: Not on file  . Years of education: Not on file  . Highest education level: Not on file  Occupational History  . Not on file  Social Needs  . Financial resource strain: Not on file  . Food insecurity    Worry: Not on file    Inability: Not on file  . Transportation needs  Medical: Not on file    Non-medical: Not on file  Tobacco Use  . Smoking status: Never Smoker  . Smokeless tobacco: Never Used  Substance and Sexual Activity  . Alcohol use: Yes    Comment: rarely  . Drug use: No  . Sexual activity: Not on file  Lifestyle  . Physical activity    Days per week: Not on file    Minutes per session: Not on file  . Stress: Not on file  Relationships  . Social Herbalist on phone: Not on file    Gets together: Not on file    Attends religious service: Not on file    Active member of club or organization: Not on file    Attends meetings of clubs or organizations: Not on file    Relationship status: Not  on file  . Intimate partner violence    Fear of current or ex partner: Not on file    Emotionally abused: Not on file    Physically abused: Not on file    Forced sexual activity: Not on file  Other Topics Concern  . Not on file  Social History Narrative  . Not on file    FAMILY HISTORY:  Family History  Problem Relation Age of Onset  . COPD Mother   . Melanoma Father     CURRENT MEDICATIONS:  Outpatient Encounter Medications as of 11/09/2018  Medication Sig  . allopurinol (ZYLOPRIM) 300 MG tablet Take 1 tablet (300 mg total) by mouth daily.  Marland Kitchen amLODipine (NORVASC) 10 MG tablet Take 10 mg by mouth daily.  . Bevacizumab (AVASTIN IV) Inject into the vein every 14 (fourteen) days.  Marland Kitchen dextrose 5 % SOLN 1,000 mL with fluorouracil 5 GM/100ML SOLN Inject into the vein over 48 hr.  . FLUOROURACIL IV Inject into the vein every 14 (fourteen) days.  . insulin glargine (LANTUS) 100 UNIT/ML injection Inject 30 Units into the skin at bedtime.   . IRINOTECAN HCL IV Inject into the vein every 14 (fourteen) days.  Marland Kitchen LEUCOVORIN CALCIUM IV Inject into the vein every 14 (fourteen) days.  . metoprolol succinate (TOPROL-XL) 50 MG 24 hr tablet Take 50 mg by mouth daily. Take with or immediately following a meal.  . OXALIPLATIN IV Inject into the vein every 14 (fourteen) days.  . potassium chloride SA (K-DUR) 20 MEQ tablet Take 1 tablet (20 mEq total) by mouth daily.  . [DISCONTINUED] donepezil (ARICEPT) 5 MG tablet Take 1 tablet (5 mg total) by mouth at bedtime.  . diphenoxylate-atropine (LOMOTIL) 2.5-0.025 MG tablet Take 1 tablet by mouth 4 (four) times daily as needed for diarrhea or loose stools. (Patient not taking: Reported on 10/26/2018)  . donepezil (ARICEPT) 10 MG tablet Take 1 tablet (10 mg total) by mouth at bedtime.  Marland Kitchen HYDROcodone-acetaminophen (NORCO) 5-325 MG tablet Take 1 tablet by mouth every 4 (four) hours as needed for moderate pain. (Patient not taking: Reported on 10/26/2018)  .  lidocaine-prilocaine (EMLA) cream Apply a small amount to port a cath site and cover with plastic wrap 1 hour prior to chemotherapy appointments (Patient not taking: Reported on 10/26/2018)  . loperamide (IMODIUM A-D) 2 MG tablet Take 2 at onset of diarrhea, then 1 after each watery bowel movement. (Patient not taking: Reported on 10/26/2018)  . magic mouthwash w/lidocaine SOLN Take 5 mLs by mouth 4 (four) times daily as needed for mouth pain. (Patient not taking: Reported on 10/26/2018)  . methylPREDNISolone (MEDROL DOSEPAK) 4 MG  TBPK tablet Take as directed  . prochlorperazine (COMPAZINE) 10 MG tablet Take 1 tablet (10 mg total) by mouth every 6 (six) hours as needed (Nausea or vomiting). (Patient not taking: Reported on 11/02/2018)  . scopolamine (TRANSDERM-SCOP) 1 MG/3DAYS Place 1 patch (1.5 mg total) onto the skin every 3 (three) days. (Patient not taking: Reported on 10/26/2018)  . [EXPIRED] influenza vac split quadrivalent PF (FLUARIX) injection 0.5 mL    No facility-administered encounter medications on file as of 11/09/2018.     ALLERGIES:  Allergies  Allergen Reactions  . Penicillin G Nausea And Vomiting     PHYSICAL EXAM:  ECOG Performance status: 1  Vital signs: Blood pressure is 130/75.  Pulse rate is 80.  Respiratory is 18.  Temperature 98.  Physical Exam Cardiovascular:     Rate and Rhythm: Normal rate and regular rhythm.     Heart sounds: Normal heart sounds.  Pulmonary:     Effort: Pulmonary effort is normal.     Breath sounds: Normal breath sounds.  Abdominal:     General: There is no distension.     Palpations: Abdomen is soft. There is no mass.  Musculoskeletal: Normal range of motion.  Skin:    General: Skin is warm and dry.  Neurological:     Mental Status: He is alert and oriented to person, place, and time. Mental status is at baseline.  Psychiatric:        Mood and Affect: Mood normal.        Behavior: Behavior normal.        Thought Content: Thought  content normal.        Judgment: Judgment normal.      LABORATORY DATA:  I have reviewed the labs as listed.  CBC    Component Value Date/Time   WBC 15.4 (H) 11/09/2018 1024   RBC 3.35 (L) 11/09/2018 1024   HGB 9.6 (L) 11/09/2018 1024   HCT 31.3 (L) 11/09/2018 1024   PLT 193 11/09/2018 1024   MCV 93.4 11/09/2018 1024   MCH 28.7 11/09/2018 1024   MCHC 30.7 11/09/2018 1024   RDW 15.4 11/09/2018 1024   LYMPHSABS 1.6 11/09/2018 1024   MONOABS 0.9 11/09/2018 1024   EOSABS 0.2 11/09/2018 1024   BASOSABS 0.0 11/09/2018 1024   CMP Latest Ref Rng & Units 11/09/2018 11/02/2018 10/26/2018  Glucose 70 - 99 mg/dL 178(H) 80 144(H)  BUN 8 - 23 mg/dL 10 9 12   Creatinine 0.61 - 1.24 mg/dL 1.16 1.00 1.34(H)  Sodium 135 - 145 mmol/L 141 141 141  Potassium 3.5 - 5.1 mmol/L 4.5 3.7 4.1  Chloride 98 - 111 mmol/L 106 109 109  CO2 22 - 32 mmol/L 24 25 23   Calcium 8.9 - 10.3 mg/dL 9.5 8.9 9.2  Total Protein 6.5 - 8.1 g/dL 6.4(L) 6.0(L) 6.0(L)  Total Bilirubin 0.3 - 1.2 mg/dL 0.3 0.6 0.6  Alkaline Phos 38 - 126 U/L 160(H) 74 116  AST 15 - 41 U/L 17 15 14(L)  ALT 0 - 44 U/L 13 12 12      I have independently reviewed scans and discussed with the patient.  ASSESSMENT & PLAN:   Adenocarcinoma of sigmoid colon ([place]) 1.  Metastatic sigmoid colon Smith to the liver: -Foundation 1 shows MS-stable, RAS-wild-type. - Cycle 1 of FOLFOXIRI and bevacizumab on 08/16/2018. -Cycle 1 was tolerated very poorly with severe nausea and diarrhea and worsening renal function. - Single copy of the UGT1A1 *28 allele, indicating increased risk for neutropenia and other  side effects. -Cycle 2 of FOLFOX on 09/07/2018 with oxaliplatin dose reduced by 20%. - He had watery diarrhea and some soreness in the mouth along with nausea after cycle 2.  Cycle 3 was held on 09/22/2018 secondary to gout of right hand middle finger and right knee. - Cycle 3 of FOLFOX with 20% dose reduction of infusional 5-FU on 10/19/2018. - Cycle 4 on  11/02/2018.  He has tolerated it reasonably well.  He lost 3 pounds.  Denied any nausea or vomiting.  He had diarrhea for couple of days.  Appetite and energy levels are 75%.  We will see him back in 1 week to initiate cycle 5.  I will plan to repeat scans after cycle 6.  2.  Gout: -He is continuing allopurinol.  Uric acid is within normal limits.  However he has redness and pain of the middle finger joint. -I will give him Medrol Dosepak.  3.  Cognitive dysfunction: - He had developed memory problems.  Aricept 5 mg started around 10/24/2018. - His wife has noticed mild improvement.  We will increase it to 10 mg daily.  4.  CKD: -Creatinine is 1.16 today.   Total time spent is 25 minutes with more than 50% of the time spent face-to-face discussing treatment plan, counseling and coordination of care.  Orders placed this encounter:  No orders of the defined types were placed in this encounter.    Derek Jack, MD [place] [phone]

## 2018-11-10 LAB — CEA: CEA: 3.2 ng/mL (ref 0.0–4.7)

## 2018-11-16 ENCOUNTER — Inpatient Hospital Stay (HOSPITAL_BASED_OUTPATIENT_CLINIC_OR_DEPARTMENT_OTHER): Payer: 59 | Admitting: Hematology

## 2018-11-16 ENCOUNTER — Other Ambulatory Visit: Payer: Self-pay

## 2018-11-16 ENCOUNTER — Encounter (HOSPITAL_COMMUNITY): Payer: Self-pay | Admitting: Hematology

## 2018-11-16 ENCOUNTER — Inpatient Hospital Stay (HOSPITAL_COMMUNITY): Payer: 59 | Attending: Hematology

## 2018-11-16 ENCOUNTER — Inpatient Hospital Stay (HOSPITAL_COMMUNITY): Payer: 59

## 2018-11-16 VITALS — BP 158/81 | HR 68 | Temp 96.8°F | Resp 18

## 2018-11-16 DIAGNOSIS — C787 Secondary malignant neoplasm of liver and intrahepatic bile duct: Secondary | ICD-10-CM | POA: Insufficient documentation

## 2018-11-16 DIAGNOSIS — E119 Type 2 diabetes mellitus without complications: Secondary | ICD-10-CM | POA: Diagnosis not present

## 2018-11-16 DIAGNOSIS — N189 Chronic kidney disease, unspecified: Secondary | ICD-10-CM | POA: Diagnosis not present

## 2018-11-16 DIAGNOSIS — Z5111 Encounter for antineoplastic chemotherapy: Secondary | ICD-10-CM | POA: Insufficient documentation

## 2018-11-16 DIAGNOSIS — I10 Essential (primary) hypertension: Secondary | ICD-10-CM | POA: Insufficient documentation

## 2018-11-16 DIAGNOSIS — C187 Malignant neoplasm of sigmoid colon: Secondary | ICD-10-CM

## 2018-11-16 DIAGNOSIS — Z5189 Encounter for other specified aftercare: Secondary | ICD-10-CM | POA: Diagnosis not present

## 2018-11-16 DIAGNOSIS — Z5112 Encounter for antineoplastic immunotherapy: Secondary | ICD-10-CM | POA: Diagnosis present

## 2018-11-16 DIAGNOSIS — Z95828 Presence of other vascular implants and grafts: Secondary | ICD-10-CM

## 2018-11-16 DIAGNOSIS — M109 Gout, unspecified: Secondary | ICD-10-CM | POA: Insufficient documentation

## 2018-11-16 LAB — CBC WITH DIFFERENTIAL/PLATELET
Abs Immature Granulocytes: 0.37 10*3/uL — ABNORMAL HIGH (ref 0.00–0.07)
Basophils Absolute: 0 10*3/uL (ref 0.0–0.1)
Basophils Relative: 0 %
Eosinophils Absolute: 0.1 10*3/uL (ref 0.0–0.5)
Eosinophils Relative: 1 %
HCT: 32 % — ABNORMAL LOW (ref 39.0–52.0)
Hemoglobin: 9.8 g/dL — ABNORMAL LOW (ref 13.0–17.0)
Immature Granulocytes: 4 %
Lymphocytes Relative: 22 %
Lymphs Abs: 2.2 10*3/uL (ref 0.7–4.0)
MCH: 28.6 pg (ref 26.0–34.0)
MCHC: 30.6 g/dL (ref 30.0–36.0)
MCV: 93.3 fL (ref 80.0–100.0)
Monocytes Absolute: 0.5 10*3/uL (ref 0.1–1.0)
Monocytes Relative: 6 %
Neutro Abs: 6.5 10*3/uL (ref 1.7–7.7)
Neutrophils Relative %: 67 %
Platelets: 152 10*3/uL (ref 150–400)
RBC: 3.43 MIL/uL — ABNORMAL LOW (ref 4.22–5.81)
RDW: 16.3 % — ABNORMAL HIGH (ref 11.5–15.5)
WBC: 9.6 10*3/uL (ref 4.0–10.5)
nRBC: 0 % (ref 0.0–0.2)

## 2018-11-16 LAB — COMPREHENSIVE METABOLIC PANEL
ALT: 18 U/L (ref 0–44)
AST: 18 U/L (ref 15–41)
Albumin: 3.4 g/dL — ABNORMAL LOW (ref 3.5–5.0)
Alkaline Phosphatase: 89 U/L (ref 38–126)
Anion gap: 11 (ref 5–15)
BUN: 14 mg/dL (ref 8–23)
CO2: 23 mmol/L (ref 22–32)
Calcium: 8.9 mg/dL (ref 8.9–10.3)
Chloride: 104 mmol/L (ref 98–111)
Creatinine, Ser: 1.05 mg/dL (ref 0.61–1.24)
GFR calc Af Amer: >60 mL/min (ref >60)
GFR calc non Af Amer: >60 mL/min (ref >60)
Glucose, Bld: 185 mg/dL — ABNORMAL HIGH (ref 70–99)
Potassium: 3.7 mmol/L (ref 3.5–5.1)
Sodium: 138 mmol/L (ref 135–145)
Total Bilirubin: 0.5 mg/dL (ref 0.3–1.2)
Total Protein: 6 g/dL — ABNORMAL LOW (ref 6.5–8.1)

## 2018-11-16 MED ORDER — SODIUM CHLORIDE 0.9 % IV SOLN
1920.0000 mg/m2 | INTRAVENOUS | Status: DC
  Administered 2018-11-16: 4300 mg via INTRAVENOUS
  Filled 2018-11-16: qty 86

## 2018-11-16 MED ORDER — SODIUM CHLORIDE 0.9 % IV SOLN
Freq: Once | INTRAVENOUS | Status: AC
  Administered 2018-11-16: 10:00:00 via INTRAVENOUS
  Filled 2018-11-16: qty 5

## 2018-11-16 MED ORDER — LEUCOVORIN CALCIUM INJECTION 350 MG
400.0000 mg/m2 | Freq: Once | INTRAVENOUS | Status: AC
  Administered 2018-11-16: 11:00:00 900 mg via INTRAVENOUS
  Filled 2018-11-16: qty 10

## 2018-11-16 MED ORDER — FLUOROURACIL CHEMO INJECTION 2.5 GM/50ML
320.0000 mg/m2 | Freq: Once | INTRAVENOUS | Status: AC
  Administered 2018-11-16: 700 mg via INTRAVENOUS
  Filled 2018-11-16: qty 14

## 2018-11-16 MED ORDER — DEXTROSE 5 % IV SOLN
Freq: Once | INTRAVENOUS | Status: AC
  Administered 2018-11-16: 09:00:00 via INTRAVENOUS

## 2018-11-16 MED ORDER — SODIUM CHLORIDE 0.9 % IV SOLN
450.0000 mg | Freq: Once | INTRAVENOUS | Status: AC
  Administered 2018-11-16: 11:00:00 450 mg via INTRAVENOUS
  Filled 2018-11-16: qty 16

## 2018-11-16 MED ORDER — ATROPINE SULFATE 1 MG/ML IJ SOLN
0.5000 mg | Freq: Once | INTRAMUSCULAR | Status: AC | PRN
  Administered 2018-11-16: 0.5 mg via INTRAVENOUS
  Filled 2018-11-16: qty 1

## 2018-11-16 MED ORDER — PALONOSETRON HCL INJECTION 0.25 MG/5ML
0.2500 mg | Freq: Once | INTRAVENOUS | Status: AC
  Administered 2018-11-16: 0.25 mg via INTRAVENOUS
  Filled 2018-11-16: qty 5

## 2018-11-16 MED ORDER — OXALIPLATIN CHEMO INJECTION 100 MG/20ML
67.0000 mg/m2 | Freq: Once | INTRAVENOUS | Status: AC
  Administered 2018-11-16: 150 mg via INTRAVENOUS
  Filled 2018-11-16: qty 10

## 2018-11-16 NOTE — Assessment & Plan Note (Addendum)
1.  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 - Cycle 1 of FOLFOXIRI and bevacizumab on 08/16/2018. -Cycle 1 was tolerated very poorly with severe nausea and diarrhea and worsening renal function. 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 -Cycle 2 of FOLFOX on 09/07/2018 with oxaliplatin dose reduced by 20%. - He had watery diarrhea and some soreness in the mouth along with nausea after cycle 2.  Cycle 3 was held on 09/22/2018 secondary to gout of right hand middle finger and right knee. - Cycle 3 of FOLFOX with 20% dose reduction of infusional 5-FU on 10/19/2018. - Cycle 4 on 11/02/2018.  Today he feels much better.  He has not lost any weight. -We reviewed his labs.  We will proceed with cycle 5 today.  He will be seen back in 2 weeks for follow-up.  I plan to repeat scans after cycle 6. -His CEA on 11/09/2018 has normalized to 3.2.  He has intermittent numbness in the fingertips which is not affecting any of his activities.  2.  Gout: -He will continue allopurinol.  His joints improved after Medrol Dosepak at last visit.  3.  Cognitive dysfunction: - He had developed memory problems.  Aricept 5 mg started around 10/24/2018. - Aricept was increased to 10 mg daily on 11/09/2018.  There is mild improvement in memory problems.  4.  CKD: -Creatinine has been stable around 1.1.

## 2018-11-16 NOTE — Patient Instructions (Signed)
[facility] at [facility] Discharge Instructions  You were seen today by Dr. Katragadda. He went over your recent lab results. He will see you back in 2 weeks for labs, treatment and follow up.   Thank you for choosing [facility] at [facility] to provide your oncology and hematology care.  To afford each patient quality time with our provider, please arrive at least 15 minutes before your scheduled appointment time.   If you have a lab appointment with the Cancer Center please come in thru the  Main Entrance and check in at the main information desk  You need to re-schedule your appointment should you arrive 10 or more minutes late.  We strive to give you quality time with our providers, and arriving late affects you and other patients whose appointments are after yours.  Also, if you no show three or more times for appointments you may be dismissed from the clinic at the providers discretion.     Again, thank you for choosing [facility].  Our hope is that these requests will decrease the amount of time that you wait before being seen by our physicians.       _____________________________________________________________  Should you have questions after your visit to [facility], please contact our office at [phone] between the hours of 8:00 a.m. and 4:30 p.m.  Voicemails left after 4:00 p.m. will not be returned until the following business day.  For prescription refill requests, have your pharmacy contact our office and allow 72 hours.    Cancer Center Support Programs:   > Cancer Support Group  2nd Tuesday of the month 1pm-2pm, Journey Room    

## 2018-11-16 NOTE — Progress Notes (Signed)
Pt presents today for tx and f/u appt with Dr. Delton Coombes. VSS. Labs reviewed. Message received from [facility] LPN to proceed with tx today.    Bevacizumab dose decreased today due to  weight loss.   Treatment given today per MD orders. Tolerated infusion without adverse affects. Vital signs stable. No complaints at this time. 5FU pump infusing per protocol. RUN noted on the screen.  Discharged from clinic ambulatory. F/U with [facility] as scheduled.

## 2018-11-16 NOTE — Patient Instructions (Signed)
[facility] Discharge Instructions for Patients Receiving Chemotherapy  Today you received the following chemotherapy agents   To help prevent nausea and vomiting after your treatment, we encourage you to take your nausea medication   If you develop nausea and vomiting that is not controlled by your nausea medication, call the clinic.   BELOW ARE SYMPTOMS THAT SHOULD BE REPORTED IMMEDIATELY:  *FEVER GREATER THAN 100.5 F  *CHILLS WITH OR WITHOUT FEVER  NAUSEA AND VOMITING THAT IS NOT CONTROLLED WITH YOUR NAUSEA MEDICATION  *UNUSUAL SHORTNESS OF BREATH  *UNUSUAL BRUISING OR BLEEDING  TENDERNESS IN MOUTH AND THROAT WITH OR WITHOUT PRESENCE OF ULCERS  *URINARY PROBLEMS  *BOWEL PROBLEMS  UNUSUAL RASH Items with * indicate a potential emergency and should be followed up as soon as possible.  Feel free to call the clinic should you have any questions or concerns. The clinic phone number is [phone].  Please show the CHEMO ALERT CARD at check-in to the Emergency Department and triage nurse.   

## 2018-11-16 NOTE — Progress Notes (Signed)
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 Focal narrowing and wall thickening of the sigmoid colon is noted in this area most likely due to inflammation, but neoplasm cannot be excluded, and sigmoidoscopy is recommended for further evaluation.   03/20/2016 Procedure   Colonoscopy by Dr. Laural Golden- Malignant partially obstructing tumor in the distal sigmoid colon. Biopsied. - Incomplete exam.   03/23/2016 Pathology Results   Colon, biopsy, sigmoid mass - ADENOCARCINOMA.   03/23/2016 Procedure   Low anterior resection with transanal anastomosis   03/26/2016 Pathology Results   Colon, segmental resection for tumor, sigmoid - INVASIVE WELL DIFFERENTIATED ADENOCARCINOMA, SPANNING 5 CM IN GREATEST DIMENSION. - TUMOR INVADES THROUGH MUSCULARIS PROPRIA TO INVOLVE SUBSEROSAL SOFT TISSUES. - UNDERLYING MARKED ACUTE AND CHRONIC INFLAMMATION WITH GIANT CELL REACTION AND ABSCESS FORMATION EXTENDING TO THE SEROSAL SURFACE AND INVOLVING THE MESENTERIC TISSUE. - MARGINS ARE NEGATIVE FOR TUMOR. - FOURTEEN BENIGN LYMPH NODES WHICH DEMONSTRATE VARIABLE INVOLVEMENT WITH ACUTE AND CHRONIC INFLAMMATION WITH ASSOCIATED GIANT CELLS WITHOUT TUMOR IDENTIFIED (0/14).   04/08/2016 Cancer Staging   Cancer Staging Adenocarcinoma of sigmoid colon [facility]) Staging form: Colon and Rectum, AJCC 8th Edition - Pathologic stage from 04/01/2016: Stage IIA (pT3, pN0, cM0) - Signed by Baird Cancer, PA-C on 04/01/2016    07/09/2016 Imaging   CT abd/pelvis- No evidence of metastatic disease or  other acute findings.  Bilateral nonobstructing renal calculi and mild bilateral renal parenchymal scarring.  Stable mildly enlarged prostate gland and findings of chronic bladder outlet obstruction.   08/16/2018 -  Chemotherapy   The patient had palonosetron (ALOXI) injection 0.25 mg, 0.25 mg, Intravenous,  Once, 5 of 6 cycles Administration: 0.25 mg (08/16/2018), 0.25 mg (09/07/2018), 0.25 mg (10/19/2018), 0.25 mg (11/02/2018), 0.25 mg (11/16/2018) pegfilgrastim (NEULASTA) injection 6 mg, 6 mg, Subcutaneous, Once, 5 of 6 cycles Administration: 6 mg (08/18/2018), 6 mg (09/09/2018), 6 mg (10/21/2018), 6 mg (11/04/2018) irinotecan (CAMPTOSAR) 380 mg in sodium chloride 0.9 % 500 mL chemo infusion, 165 mg/m2 = 380 mg, Intravenous,  Once, 1 of 1 cycle Administration: 380 mg (08/16/2018) leucovorin 900 mg in dextrose 5 % 250 mL infusion, 400 mg/m2 = 900 mg, Intravenous,  Once, 5 of 6 cycles Administration: 900 mg (08/16/2018), 900 mg (09/07/2018), 900 mg (10/19/2018), 900 mg (11/02/2018), 900 mg (11/16/2018) oxaliplatin (ELOXATIN) 190 mg in dextrose 5 % 500 mL chemo infusion, 85 mg/m2 = 190 mg, Intravenous,  Once, 5 of 6 cycles Dose modification: 68 mg/m2 (80 % of original dose 85 mg/m2, Cycle 2, Reason: Provider Judgment), 68 mg/m2 (80 % of original dose 85 mg/m2, Cycle 3, Reason: Provider Judgment) Administration: 190 mg (08/16/2018), 150 mg (09/07/2018), 150 mg (10/19/2018), 150 mg (11/16/2018), 150 mg (11/02/2018) fluorouracil (ADRUCIL) chemo injection 700 mg, 320 mg/m2 = 700 mg (100 % of original dose 320 mg/m2), Intravenous,  Once, 2 of 3 cycles Dose modification: 320 mg/m2 (original dose 320 mg/m2, Cycle 4) Administration: 700 mg (11/02/2018), 700 mg (11/16/2018) fosaprepitant (EMEND) 150 mg, dexamethasone (DECADRON) 12 mg in sodium chloride 0.9 % 145 mL IVPB, , Intravenous,  Once, 5 of 6 cycles Administration:  (08/16/2018),  (  09/07/2018),  (10/19/2018),  (11/02/2018),  (11/16/2018) fluorouracil (ADRUCIL) 5,400 mg in sodium  chloride 0.9 % 142 mL chemo infusion, 2,400 mg/m2 = 5,400 mg, Intravenous, 1 Day/Dose, 5 of 6 cycles Dose modification: 1,920 mg/m2 (80 % of original dose 2,400 mg/m2, Cycle 3, Reason: Provider Judgment) Administration: 5,400 mg (08/16/2018), 5,400 mg (09/07/2018), 4,300 mg (10/19/2018), 4,300 mg (11/02/2018), 4,300 mg (11/16/2018) bevacizumab-awwb (MVASI) 500 mg in sodium chloride 0.9 % 100 mL chemo infusion, 5 mg/kg = 500 mg (100 % of original dose 5 mg/kg), Intravenous,  Once, 5 of 6 cycles Dose modification: 5 mg/kg (original dose 5 mg/kg, Cycle 1), 5 mg/kg (original dose 5 mg/kg, Cycle 2) Administration: 500 mg (08/16/2018), 500 mg (09/07/2018), 500 mg (10/19/2018), 500 mg (11/02/2018), 450 mg (11/16/2018)  for chemotherapy treatment.       CANCER STAGING: Cancer Staging Adenocarcinoma of sigmoid colon [facility]) Staging form: Colon and Rectum, AJCC 8th Edition - Pathologic stage from 04/01/2016: Stage IIA (pT3, pN0, cM0) - Signed by Baird Cancer, PA-C on 04/01/2016    INTERVAL HISTORY:  Troy Smith 63 y.o. male seen for follow-up of metastatic sigmoid colon cancer to the liver.  Received cycle 4 of chemotherapy on 11/02/2018.  He had diarrhea for 1 day which was well controlled with Imodium.  Joint have improved after taking Medrol Dosepak for gout.  He is continuing Aricept 10 mg daily.  Reports improvement in cognitive dysfunction per wife.  Appetite and energy levels are 100%.  Intermittent numbness in the fingertips noted.    REVIEW OF SYSTEMS:  Review of Systems  Cardiovascular: Positive for leg swelling.  Gastrointestinal: Positive for diarrhea.  Neurological: Positive for numbness.  All other systems reviewed and are negative.    PAST MEDICAL/SURGICAL HISTORY:  Past Medical History:  Diagnosis Date  . 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 Carpal tunnel syndrome 05/02/2014   Bilateral  . Cervical disc disorder with radiculopathy of cervical region 05/02/2014   Left C8  .  Diabetes mellitus without complication ([place])   . Hypertension   . Port-A-Cath in place 08/10/2018   Past Surgical History:  Procedure Laterality Date  . BIOPSY  03/20/2016   Procedure: BIOPSY;  Surgeon: Rogene Houston, MD;  [facility];  Service: Endoscopy;;  colon  . BOWEL RESECTION N/A 03/23/2016   Procedure: LOW ANTERIOR RESECTION;  Surgeon: Aviva Signs, MD;  Location: [facility] ORS;  Service: General;  Laterality: N/A;  . COLONOSCOPY N/A 03/20/2016   Procedure: COLONOSCOPY;  Surgeon: Rogene Houston, MD;  Location: [facility] ENDO [facility];  Service: Endoscopy;  Laterality: N/A;  730  . COLONOSCOPY N/A 07/10/2016   Procedure: COLONOSCOPY;  Surgeon: Rogene Houston, MD;  Location: [facility];  Service: Endoscopy;  Laterality: N/A;  1040  . NO PAST SURGERIES    . POLYPECTOMY  07/10/2016   Procedure: POLYPECTOMY;  Surgeon: Rogene Houston, MD;  Location: [facility];  Service: Endoscopy;;  colon  . PORTACATH PLACEMENT Left 08/05/2018   Procedure: INSERTION PORT-A-CATH;  Surgeon: Aviva Signs, MD;  Location: [facility] ORS;  Service: General;  Laterality: Left;  . TONSILLECTOMY       SOCIAL HISTORY:  Social History   Socioeconomic History  . Marital status: Married    Spouse name: Not on file  . Number of children: Not on file  . Years of education: Not on file  . Highest education level: Not on file  Occupational History  . Not on file  Social Needs  .  Financial resource strain: Not on file  . Food insecurity    Worry: Not on file    Inability: Not on file  . Transportation needs    Medical: Not on file    Non-medical: Not on file  Tobacco Use  . Smoking status: Never Smoker  . Smokeless tobacco: Never Used  Substance and Sexual Activity  . Alcohol use: Yes    Comment: rarely  . Drug use: No  . Sexual activity: Not on file  Lifestyle  . Physical activity    Days per week: Not on file    Minutes per session: Not on file  . Stress: Not on file  Relationships  . Social  Herbalist on phone: Not on file    Gets together: Not on file    Attends religious service: Not on file    Active member of club or organization: Not on file    Attends meetings of clubs or organizations: Not on file    Relationship status: Not on file  . Intimate partner violence    Fear of current or ex partner: Not on file    Emotionally abused: Not on file    Physically abused: Not on file    Forced sexual activity: Not on file  Other Topics Concern  . Not on file  Social History Narrative  . Not on file    FAMILY HISTORY:  Family History  Problem Relation Age of Onset  . COPD Mother   . Melanoma Father     CURRENT MEDICATIONS:  Outpatient Encounter Medications as of 11/16/2018  Medication Sig  . allopurinol (ZYLOPRIM) 300 MG tablet Take 1 tablet (300 mg total) by mouth daily.  Marland Kitchen amLODipine (NORVASC) 10 MG tablet Take 10 mg by mouth daily.  . Bevacizumab (AVASTIN IV) Inject into the vein every 14 (fourteen) days.  Marland Kitchen dextrose 5 % SOLN 1,000 mL with fluorouracil 5 GM/100ML SOLN Inject into the vein over 48 hr.  . donepezil (ARICEPT) 10 MG tablet Take 1 tablet (10 mg total) by mouth at bedtime.  Marland Kitchen FLUOROURACIL IV Inject into the vein every 14 (fourteen) days.  . insulin glargine (LANTUS) 100 UNIT/ML injection Inject 30 Units into the skin at bedtime.   . IRINOTECAN HCL IV Inject into the vein every 14 (fourteen) days.  Marland Kitchen LEUCOVORIN CALCIUM IV Inject into the vein every 14 (fourteen) days.  . metoprolol succinate (TOPROL-XL) 50 MG 24 hr tablet Take 50 mg by mouth daily. Take with or immediately following a meal.  . OXALIPLATIN IV Inject into the vein every 14 (fourteen) days.  . potassium chloride SA (K-DUR) 20 MEQ tablet Take 1 tablet (20 mEq total) by mouth daily.  . diphenoxylate-atropine (LOMOTIL) 2.5-0.025 MG tablet Take 1 tablet by mouth 4 (four) times daily as needed for diarrhea or loose stools. (Patient not taking: Reported on 10/26/2018)  .  HYDROcodone-acetaminophen (NORCO) 5-325 MG tablet Take 1 tablet by mouth every 4 (four) hours as needed for moderate pain. (Patient not taking: Reported on 10/26/2018)  . lidocaine-prilocaine (EMLA) cream Apply a small amount to port a cath site and cover with plastic wrap 1 hour prior to chemotherapy appointments (Patient not taking: Reported on 10/26/2018)  . loperamide (IMODIUM A-D) 2 MG tablet Take 2 at onset of diarrhea, then 1 after each watery bowel movement. (Patient not taking: Reported on 10/26/2018)  . magic mouthwash w/lidocaine SOLN Take 5 mLs by mouth 4 (four) times daily as needed for mouth pain. (  Patient not taking: Reported on 10/26/2018)  . prochlorperazine (COMPAZINE) 10 MG tablet Take 1 tablet (10 mg total) by mouth every 6 (six) hours as needed (Nausea or vomiting). (Patient not taking: Reported on 11/02/2018)  . scopolamine (TRANSDERM-SCOP) 1 MG/3DAYS Place 1 patch (1.5 mg total) onto the skin every 3 (three) days. (Patient not taking: Reported on 10/26/2018)  . [DISCONTINUED] methylPREDNISolone (MEDROL DOSEPAK) 4 MG TBPK tablet Take as directed   No facility-administered encounter medications on file as of 11/16/2018.     ALLERGIES:  Allergies  Allergen Reactions  . Penicillin G Nausea And Vomiting     PHYSICAL EXAM:  ECOG Performance status: 1  Vital signs: Blood pressure is 130/75.  Pulse rate is 80.  Respiratory is 18.  Temperature 98.  Physical Exam Cardiovascular:     Rate and Rhythm: Normal rate and regular rhythm.     Heart sounds: Normal heart sounds.  Pulmonary:     Effort: Pulmonary effort is normal.     Breath sounds: Normal breath sounds.  Abdominal:     General: There is no distension.     Palpations: Abdomen is soft. There is no mass.  Musculoskeletal: Normal range of motion.  Skin:    General: Skin is warm and dry.  Neurological:     Mental Status: He is alert and oriented to person, place, and time. Mental status is at baseline.  Psychiatric:         Mood and Affect: Mood normal.        Behavior: Behavior normal.        Thought Content: Thought content normal.        Judgment: Judgment normal.      LABORATORY DATA:  I have reviewed the labs as listed.  CBC    Component Value Date/Time   WBC 9.6 11/16/2018 0811   RBC 3.43 (L) 11/16/2018 0811   HGB 9.8 (L) 11/16/2018 0811   HCT 32.0 (L) 11/16/2018 0811   PLT 152 11/16/2018 0811   MCV 93.3 11/16/2018 0811   MCH 28.6 11/16/2018 0811   MCHC 30.6 11/16/2018 0811   RDW 16.3 (H) 11/16/2018 0811   LYMPHSABS 2.2 11/16/2018 0811   MONOABS 0.5 11/16/2018 0811   EOSABS 0.1 11/16/2018 0811   BASOSABS 0.0 11/16/2018 0811   CMP Latest Ref Rng & Units 11/16/2018 11/09/2018 11/02/2018  Glucose 70 - 99 mg/dL 185(H) 178(H) 80  BUN 8 - 23 mg/dL 14 10 9   Creatinine 0.61 - 1.24 mg/dL 1.05 1.16 1.00  Sodium 135 - 145 mmol/L 138 141 141  Potassium 3.5 - 5.1 mmol/L 3.7 4.5 3.7  Chloride 98 - 111 mmol/L 104 106 109  CO2 22 - 32 mmol/L 23 24 25   Calcium 8.9 - 10.3 mg/dL 8.9 9.5 8.9  Total Protein 6.5 - 8.1 g/dL 6.0(L) 6.4(L) 6.0(L)  Total Bilirubin 0.3 - 1.2 mg/dL 0.5 0.3 0.6  Alkaline Phos 38 - 126 U/L 89 160(H) 74  AST 15 - 41 U/L 18 17 15   ALT 0 - 44 U/L 18 13 12      I have independently reviewed scans and discussed with the patient.  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 - Cycle 1 of FOLFOXIRI and bevacizumab on 08/16/2018. -Cycle 1 was tolerated very poorly with severe nausea and diarrhea and worsening renal function. 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 -Cycle 2 of FOLFOX on 09/07/2018 with oxaliplatin dose reduced by 20%. - He had watery diarrhea and some soreness in the mouth along with nausea after cycle 2.  Cycle 3 was held on 09/22/2018 secondary to gout of right hand middle finger and right  knee. - Cycle 3 of FOLFOX with 20% dose reduction of infusional 5-FU on 10/19/2018. - Cycle 4 on 11/02/2018.  Today he feels much better.  He has not lost any weight. -We reviewed his labs.  We will proceed with cycle 5 today.  He will be seen back in 2 weeks for follow-up.  I plan to repeat scans after cycle 6. -His CEA on 11/09/2018 has normalized to 3.2.  He has intermittent numbness in the fingertips which is not affecting any of his activities.  2.  Gout: -He will continue allopurinol.  His joints improved after Medrol Dosepak at last visit.  3.  Cognitive dysfunction: - He had developed memory problems.  Aricept 5 mg started around 10/24/2018. - Aricept was increased to 10 mg daily on 11/09/2018.  There is mild improvement in memory problems.  4.  CKD: -Creatinine has been stable around 1.1.   Total time spent is 25 minutes with more than 50% of the time spent face-to-face discussing treatment plan, counseling and coordination of care.  Orders placed this encounter:  No orders of the defined types were placed in this encounter.    Derek Jack, MD [place] [phone]

## 2018-11-16 NOTE — Progress Notes (Signed)
11/16/18  Ok to adjust dose of bevacizumab based on patients weight loss.  New dose = 5 mg/kg = 450 mg with 86 kg  Order modified to reflect new dose.  T.O. Dr Beckey Downing, LPN/ Henreitta Leber, PharmD

## 2018-11-18 ENCOUNTER — Other Ambulatory Visit: Payer: Self-pay

## 2018-11-18 ENCOUNTER — Inpatient Hospital Stay (HOSPITAL_COMMUNITY): Payer: 59

## 2018-11-18 VITALS — BP 123/72 | HR 73 | Temp 96.7°F | Resp 18

## 2018-11-18 DIAGNOSIS — Z5112 Encounter for antineoplastic immunotherapy: Secondary | ICD-10-CM | POA: Diagnosis not present

## 2018-11-18 DIAGNOSIS — C187 Malignant neoplasm of sigmoid colon: Secondary | ICD-10-CM

## 2018-11-18 DIAGNOSIS — Z95828 Presence of other vascular implants and grafts: Secondary | ICD-10-CM

## 2018-11-18 MED ORDER — SODIUM CHLORIDE 0.9% FLUSH
10.0000 mL | INTRAVENOUS | Status: DC | PRN
  Administered 2018-11-18: 13:00:00 10 mL
  Filled 2018-11-18: qty 10

## 2018-11-18 MED ORDER — HEPARIN SOD (PORK) LOCK FLUSH 100 UNIT/ML IV SOLN
500.0000 [IU] | Freq: Once | INTRAVENOUS | Status: AC | PRN
  Administered 2018-11-18: 13:00:00 500 [IU]

## 2018-11-18 MED ORDER — PEGFILGRASTIM INJECTION 6 MG/0.6ML ~~LOC~~
6.0000 mg | PREFILLED_SYRINGE | Freq: Once | SUBCUTANEOUS | Status: AC
  Administered 2018-11-18: 13:00:00 6 mg via SUBCUTANEOUS
  Filled 2018-11-18: qty 0.6

## 2018-11-18 NOTE — Patient Instructions (Signed)
[facility] at [facility]  Discharge Instructions:   _______________________________________________________________  Thank you for choosing [facility] at [facility] to provide your oncology and hematology care.  To afford each patient quality time with our providers, please arrive at least 15 minutes before your scheduled appointment.  You need to re-schedule your appointment if you arrive 10 or more minutes late.  We strive to give you quality time with our providers, and arriving late affects you and other patients whose appointments are after yours.  Also, if you no show three or more times for appointments you may be dismissed from the clinic.  Again, thank you for choosing [facility] at [facility]. Our hope is that these requests will allow you access to exceptional care and in a timely manner. _______________________________________________________________  If you have questions after your visit, please contact our office at [phone] between the hours of 8:30 a.m. and 5:00 p.m. Voicemails left after 4:30 p.m. will not be returned until the following business day. _______________________________________________________________  For prescription refill requests, have your pharmacy contact our office. _______________________________________________________________  Recommendations made by the consultant and any test results will be sent to your referring physician. _______________________________________________________________ 

## 2018-11-18 NOTE — Progress Notes (Signed)
Troy Smith presented for Pump D/C and flush.. Portacath located in the left chest wall accessed with  H 20 needle. Dry and Intact Good blood return present. Portacath flushed with 81ml NS and 500U/50ml Heparin per protocol and needle removed intact. Procedure without incident. Patient tolerated procedure well.   Troy Smith presents today for injection per MD orders. Neulasta 6mg  administered SQ in right Upper Arm. Administration without incident. Patient tolerated well.  No complaints at this time. Discharged from clinic ambulatory. F/U with [facility] as scheduled.

## 2018-11-21 ENCOUNTER — Other Ambulatory Visit (INDEPENDENT_AMBULATORY_CARE_PROVIDER_SITE_OTHER): Payer: Self-pay | Admitting: General Surgery

## 2018-11-21 DIAGNOSIS — C187 Malignant neoplasm of sigmoid colon: Secondary | ICD-10-CM

## 2018-11-21 MED ORDER — SULFAMETHOXAZOLE-TRIMETHOPRIM 800-160 MG PO TABS: 1.0000 | ORAL_TABLET | Freq: Two times a day (BID) | ORAL | 0 refills | Status: DC

## 2018-11-30 ENCOUNTER — Inpatient Hospital Stay (HOSPITAL_COMMUNITY): Payer: 59

## 2018-11-30 ENCOUNTER — Other Ambulatory Visit: Payer: Self-pay

## 2018-11-30 ENCOUNTER — Inpatient Hospital Stay (HOSPITAL_BASED_OUTPATIENT_CLINIC_OR_DEPARTMENT_OTHER): Payer: 59 | Admitting: Hematology

## 2018-11-30 ENCOUNTER — Encounter (HOSPITAL_COMMUNITY): Payer: Self-pay | Admitting: Hematology

## 2018-11-30 VITALS — BP 129/72 | HR 64 | Temp 96.8°F | Resp 18

## 2018-11-30 VITALS — BP 144/87 | HR 63 | Temp 97.9°F | Resp 18 | Wt 177.2 lb

## 2018-11-30 DIAGNOSIS — C187 Malignant neoplasm of sigmoid colon: Secondary | ICD-10-CM | POA: Diagnosis not present

## 2018-11-30 DIAGNOSIS — Z5112 Encounter for antineoplastic immunotherapy: Secondary | ICD-10-CM | POA: Diagnosis not present

## 2018-11-30 DIAGNOSIS — Z95828 Presence of other vascular implants and grafts: Secondary | ICD-10-CM

## 2018-11-30 LAB — CBC WITH DIFFERENTIAL/PLATELET
Abs Immature Granulocytes: 0.06 10*3/uL (ref 0.00–0.07)
Basophils Absolute: 0 10*3/uL (ref 0.0–0.1)
Basophils Relative: 0 %
Eosinophils Absolute: 0.2 10*3/uL (ref 0.0–0.5)
Eosinophils Relative: 2 %
HCT: 31.7 % — ABNORMAL LOW (ref 39.0–52.0)
Hemoglobin: 10.1 g/dL — ABNORMAL LOW (ref 13.0–17.0)
Immature Granulocytes: 1 %
Lymphocytes Relative: 26 %
Lymphs Abs: 2.1 10*3/uL (ref 0.7–4.0)
MCH: 28.5 pg (ref 26.0–34.0)
MCHC: 31.9 g/dL (ref 30.0–36.0)
MCV: 89.5 fL (ref 80.0–100.0)
Monocytes Absolute: 0.5 10*3/uL (ref 0.1–1.0)
Monocytes Relative: 6 %
Neutro Abs: 5.1 10*3/uL (ref 1.7–7.7)
Neutrophils Relative %: 65 %
Platelets: 230 10*3/uL (ref 150–400)
RBC: 3.54 MIL/uL — ABNORMAL LOW (ref 4.22–5.81)
RDW: 16.2 % — ABNORMAL HIGH (ref 11.5–15.5)
WBC: 7.9 10*3/uL (ref 4.0–10.5)
nRBC: 0 % (ref 0.0–0.2)

## 2018-11-30 LAB — COMPREHENSIVE METABOLIC PANEL
ALT: 14 U/L (ref 0–44)
AST: 16 U/L (ref 15–41)
Albumin: 3.4 g/dL — ABNORMAL LOW (ref 3.5–5.0)
Alkaline Phosphatase: 95 U/L (ref 38–126)
Anion gap: 10 (ref 5–15)
BUN: 28 mg/dL — ABNORMAL HIGH (ref 8–23)
CO2: 20 mmol/L — ABNORMAL LOW (ref 22–32)
Calcium: 9.3 mg/dL (ref 8.9–10.3)
Chloride: 106 mmol/L (ref 98–111)
Creatinine, Ser: 1.66 mg/dL — ABNORMAL HIGH (ref 0.61–1.24)
GFR calc Af Amer: 50 mL/min — ABNORMAL LOW (ref >60)
GFR calc non Af Amer: 44 mL/min — ABNORMAL LOW (ref >60)
Glucose, Bld: 85 mg/dL (ref 70–99)
Potassium: 3.8 mmol/L (ref 3.5–5.1)
Sodium: 136 mmol/L (ref 135–145)
Total Bilirubin: 0.6 mg/dL (ref 0.3–1.2)
Total Protein: 6.3 g/dL — ABNORMAL LOW (ref 6.5–8.1)

## 2018-11-30 LAB — URINALYSIS, DIPSTICK ONLY
Bilirubin Urine: NEGATIVE
Glucose, UA: NEGATIVE mg/dL
Hgb urine dipstick: NEGATIVE
Ketones, ur: NEGATIVE mg/dL
Leukocytes,Ua: NEGATIVE
Nitrite: NEGATIVE
Protein, ur: NEGATIVE mg/dL
Specific Gravity, Urine: 1.01 (ref 1.005–1.030)
pH: 5 (ref 5.0–8.0)

## 2018-11-30 MED ORDER — LEUCOVORIN CALCIUM INJECTION 350 MG: 400.0000 mg/m2 | Freq: Once | INTRAVENOUS | Status: DC

## 2018-11-30 MED ORDER — SODIUM CHLORIDE 0.9 % IV SOLN: 5.0000 mg/kg | Freq: Once | INTRAVENOUS | Status: DC

## 2018-11-30 MED ORDER — PALONOSETRON HCL INJECTION 0.25 MG/5ML
0.2500 mg | Freq: Once | INTRAVENOUS | Status: AC
  Administered 2018-11-30: 0.25 mg via INTRAVENOUS
  Filled 2018-11-30: qty 5

## 2018-11-30 MED ORDER — SODIUM CHLORIDE 0.9 % IV SOLN: 1920.0000 mg/m2 | INTRAVENOUS | Status: DC

## 2018-11-30 MED ORDER — SODIUM CHLORIDE 0.9 % IV SOLN
Freq: Once | INTRAVENOUS | Status: AC
  Administered 2018-11-30: 11:00:00 via INTRAVENOUS
  Filled 2018-11-30: qty 5

## 2018-11-30 MED ORDER — DEXTROSE 5 % IV SOLN
Freq: Once | INTRAVENOUS | Status: AC
  Administered 2018-11-30: 12:00:00 via INTRAVENOUS

## 2018-11-30 MED ORDER — OXALIPLATIN CHEMO INJECTION 100 MG/20ML: 67.0000 mg/m2 | Freq: Once | INTRAVENOUS | Status: DC

## 2018-11-30 MED ORDER — FLUOROURACIL CHEMO INJECTION 2.5 GM/50ML: 320.0000 mg/m2 | Freq: Once | INTRAVENOUS | Status: DC

## 2018-11-30 MED ORDER — FLUOROURACIL CHEMO INJECTION 2.5 GM/50ML
320.0000 mg/m2 | Freq: Once | INTRAVENOUS | Status: AC
  Administered 2018-11-30: 650 mg via INTRAVENOUS
  Filled 2018-11-30: qty 13

## 2018-11-30 MED ORDER — SODIUM CHLORIDE 0.9 % IV SOLN
INTRAVENOUS | Status: DC
  Administered 2018-11-30: 12:00:00 via INTRAVENOUS

## 2018-11-30 MED ORDER — SODIUM CHLORIDE 0.9 % IV SOLN
1920.0000 mg/m2 | INTRAVENOUS | Status: DC
  Administered 2018-11-30: 3850 mg via INTRAVENOUS
  Filled 2018-11-30: qty 77

## 2018-11-30 MED ORDER — OXALIPLATIN CHEMO INJECTION 100 MG/20ML
68.0000 mg/m2 | Freq: Once | INTRAVENOUS | Status: AC
  Administered 2018-11-30: 12:00:00 135 mg via INTRAVENOUS
  Filled 2018-11-30: qty 7

## 2018-11-30 MED ORDER — SODIUM CHLORIDE 0.9 % IV SOLN
5.0000 mg/kg | Freq: Once | INTRAVENOUS | Status: AC
  Administered 2018-11-30: 400 mg via INTRAVENOUS
  Filled 2018-11-30: qty 16

## 2018-11-30 MED ORDER — SODIUM CHLORIDE 0.9 % IV SOLN
INTRAVENOUS | Status: AC
  Administered 2018-11-30: 10:00:00 via INTRAVENOUS

## 2018-11-30 MED ORDER — SODIUM CHLORIDE 0.9% FLUSH
10.0000 mL | INTRAVENOUS | Status: DC | PRN
  Administered 2018-11-30: 10 mL
  Filled 2018-11-30: qty 10

## 2018-11-30 MED ORDER — ATROPINE SULFATE 1 MG/ML IJ SOLN
0.5000 mg | Freq: Once | INTRAMUSCULAR | Status: AC | PRN
  Administered 2018-11-30: 12:00:00 0.5 mg via INTRAVENOUS
  Filled 2018-11-30: qty 1

## 2018-11-30 MED ORDER — LEUCOVORIN CALCIUM INJECTION 350 MG
398.0000 mg/m2 | Freq: Once | INTRAVENOUS | Status: AC
  Administered 2018-11-30: 800 mg via INTRAVENOUS
  Filled 2018-11-30: qty 5

## 2018-11-30 NOTE — Patient Instructions (Signed)
[facility] Discharge Instructions for Patients Receiving Chemotherapy   Beginning January 23rd 2017 lab work for the Cancer Center will be done in the  Main lab at  on 1st floor. If you have a lab appointment with the Cancer Center please come in thru the  Main Entrance and check in at the main information desk   Today you received the following chemotherapy agents Avastin,Oxaliplatin, Leucovorin and 5FU.Follow-up as scheduled. Call clinic for any questions or concerns  To help prevent nausea and vomiting after your treatment, we encourage you to take your nausea medication   If you develop nausea and vomiting, or diarrhea that is not controlled by your medication, call the clinic.  The clinic phone number is [phone]. Office hours are Monday-Friday 8:30am-5:00pm.  BELOW ARE SYMPTOMS THAT SHOULD BE REPORTED IMMEDIATELY:  *FEVER GREATER THAN 101.0 F  *CHILLS WITH OR WITHOUT FEVER  NAUSEA AND VOMITING THAT IS NOT CONTROLLED WITH YOUR NAUSEA MEDICATION  *UNUSUAL SHORTNESS OF BREATH  *UNUSUAL BRUISING OR BLEEDING  TENDERNESS IN MOUTH AND THROAT WITH OR WITHOUT PRESENCE OF ULCERS  *URINARY PROBLEMS  *BOWEL PROBLEMS  UNUSUAL RASH Items with * indicate a potential emergency and should be followed up as soon as possible. If you have an emergency after office hours please contact your primary care physician or go to the nearest emergency department.  Please call the clinic during office hours if you have any questions or concerns.   You may also contact the Patient Navigator at [phone] should you have any questions or need assistance in obtaining follow up care.      Resources For Cancer Patients and their Caregivers ? American Cancer Society: Can assist with transportation, wigs, general needs, runs Look Good Feel Better.        1-888-227-6333 ? Cancer Care: Provides financial assistance, online support groups, medication/co-pay  assistance.  1-800-813-HOPE (4673) ? [facility] Assists [place] cancer patients and their families through emotional , educational and financial support.  336-427-4357 ? [place] DSS Where to apply for food stamps, Medicaid and utility assistance. 336-342-1394 ? [facility]: Transportation to medical appointments. 336-347-2287 ? Social Security Administration: May apply for disability if have a Stage IV cancer. 336-342-7796 1-800-772-1213 ? [place] Aging, Disability and Transit Services: Assists with nutrition, care and transit needs. 336-349-2343         

## 2018-11-30 NOTE — Assessment & Plan Note (Signed)
1.  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 - Cycle 1 of FOLFOXIRI and bevacizumab on 08/16/2018. -Cycle 1 was tolerated very poorly with severe nausea and diarrhea and worsening renal function. 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 -Cycle 2 of FOLFOX on 09/07/2018 with oxaliplatin dose reduced by 20%. - He had watery diarrhea and some soreness in the mouth along with nausea after cycle 2.  Cycle 3 was held on 09/22/2018 secondary to gout of right hand middle finger and right knee. - Cycle 3 of FOLFOX with 20% dose reduction of infusional 5-FU on 10/19/2018. - Cycle 4 on 11/02/2018.  Today he feels much better.  He has not lost any weight. --His CEA on 11/09/2018 has normalized to 3.2.  He has intermittent numbness in the fingertips which is not affecting any of his activities. - Labs are acceptable to proceed with Cycle 6 today. Serum creatinine is elevated at 1.66 secondary to prescribed Bactrim for treatment of a furuncle. We will administer 1L NS today in clinic and 500 mL on Friday.  - We will schedule him for restaging CT CAP. - He will be evaluated by hepatobillary surgeron, Dr. Crisoforo Oxford in 2 weeks for surgery evaluation. - RTC in 3 weeks.  2.  Gout: -He will continue allopurinol.  His joints improved after Medrol Dosepak at last visit.  3.  Cognitive dysfunction: - He had developed memory problems.  Aricept 5 mg started around 10/24/2018. - Aricept was increased to 10 mg daily on 11/09/2018.  There is mild improvement in memory problems.  4.  CKD: -Creatinine has been stable around 1.1.

## 2018-11-30 NOTE — Progress Notes (Signed)
11/30/18  Confirmed dose changes with new BSA: 2.01 m2 (80.4 kg).  Doses reflect the new values.  T.O. Dr Rhys Martini, PharmD

## 2018-11-30 NOTE — Patient Instructions (Signed)
[place] at [facility] Discharge Instructions  You were seen today by Dr. Delton Coombes. He went over your recent lab results. He will schedule you for repeat scans next week. He will see you back in 3 weeks for labs and follow up.   Thank you for choosing [place] at [facility] to provide your oncology and hematology care.  To afford each patient quality time with our provider, please arrive at least 15 minutes before your scheduled appointment time.   If you have a lab appointment with the [facility] please come in thru the  Main Entrance and check in at the main information desk  You need to re-schedule your appointment should you arrive 10 or more minutes late.  We strive to give you quality time with our providers, and arriving late affects you and other patients whose appointments are after yours.  Also, if you no show three or more times for appointments you may be dismissed from the clinic at the providers discretion.     Again, thank you for choosing [facility].  Our hope is that these requests will decrease the amount of time that you wait before being seen by our physicians.       _____________________________________________________________  Should you have questions after your visit to [facility], please contact our office at [phone] between the hours of 8:00 a.m. and 4:30 p.m.  Voicemails left after 4:00 p.m. will not be returned until the following business day.  For prescription refill requests, have your pharmacy contact our office and allow 72 hours.    Cancer Center Support Programs:   > Cancer Support Group  2nd Tuesday of the month 1pm-2pm, Journey Room

## 2018-11-30 NOTE — Progress Notes (Signed)
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 Focal narrowing and wall thickening of the sigmoid colon is noted in this area most likely due to inflammation, but neoplasm cannot be excluded, and sigmoidoscopy is recommended for further evaluation.   03/20/2016 Procedure   Colonoscopy by Dr. Laural Smith- Malignant partially obstructing tumor in the distal sigmoid colon. Biopsied. - Incomplete exam.   03/23/2016 Pathology Results   Colon, biopsy, sigmoid mass - ADENOCARCINOMA.   03/23/2016 Procedure   Low anterior resection with transanal anastomosis   03/26/2016 Pathology Results   Colon, segmental resection for tumor, sigmoid - INVASIVE WELL DIFFERENTIATED ADENOCARCINOMA, SPANNING 5 CM IN GREATEST DIMENSION. - TUMOR INVADES THROUGH MUSCULARIS PROPRIA TO INVOLVE SUBSEROSAL SOFT TISSUES. - UNDERLYING MARKED ACUTE AND CHRONIC INFLAMMATION WITH GIANT CELL REACTION AND ABSCESS FORMATION EXTENDING TO THE SEROSAL SURFACE AND INVOLVING THE MESENTERIC TISSUE. - MARGINS ARE NEGATIVE FOR TUMOR. - FOURTEEN BENIGN LYMPH NODES WHICH DEMONSTRATE VARIABLE INVOLVEMENT WITH ACUTE AND CHRONIC INFLAMMATION WITH ASSOCIATED GIANT CELLS WITHOUT TUMOR IDENTIFIED (0/14).   04/08/2016 Smith Staging   Smith Staging Adenocarcinoma of sigmoid colon [facility]) Staging form: Colon and Rectum, AJCC 8th Edition - Pathologic stage from 04/01/2016: Stage IIA (pT3, pN0, cM0) - Signed by Troy Cancer, PA-C on 04/01/2016    07/09/2016 Imaging   CT abd/pelvis- No evidence of metastatic disease or  other acute findings.  Bilateral nonobstructing renal calculi and mild bilateral renal parenchymal scarring.  Stable mildly enlarged prostate gland and findings of chronic bladder outlet obstruction.   08/16/2018 -  Chemotherapy   The patient had palonosetron (ALOXI) injection 0.25 mg, 0.25 mg, Intravenous,  Once, 6 of 6 cycles Administration: 0.25 mg (08/16/2018), 0.25 mg (09/07/2018), 0.25 mg (10/19/2018), 0.25 mg (11/02/2018), 0.25 mg (11/16/2018), 0.25 mg (11/30/2018) pegfilgrastim (NEULASTA) injection 6 mg, 6 mg, Subcutaneous, Once, 6 of 6 cycles Administration: 6 mg (08/18/2018), 6 mg (09/09/2018), 6 mg (10/21/2018), 6 mg (11/04/2018), 6 mg (11/18/2018) irinotecan (CAMPTOSAR) 380 mg in sodium chloride 0.9 % 500 mL chemo infusion, 165 mg/m2 = 380 mg, Intravenous,  Once, 1 of 1 cycle Administration: 380 mg (08/16/2018) leucovorin 900 mg in dextrose 5 % 250 mL infusion, 400 mg/m2 = 900 mg, Intravenous,  Once, 6 of 6 cycles Administration: 900 mg (08/16/2018), 900 mg (09/07/2018), 900 mg (10/19/2018), 900 mg (11/02/2018), 900 mg (11/16/2018), 800 mg (11/30/2018) oxaliplatin (ELOXATIN) 190 mg in dextrose 5 % 500 mL chemo infusion, 85 mg/m2 = 190 mg, Intravenous,  Once, 6 of 6 cycles Dose modification: 68 mg/m2 (80 % of original dose 85 mg/m2, Cycle 2, Reason: Provider Judgment), 68 mg/m2 (80 % of original dose 85 mg/m2, Cycle 3, Reason: Provider Judgment) Administration: 190 mg (08/16/2018), 150 mg (09/07/2018), 150 mg (10/19/2018), 150 mg (11/16/2018), 150 mg (11/02/2018), 135 mg (11/30/2018) fluorouracil (ADRUCIL) chemo injection 700 mg, 320 mg/m2 = 700 mg (100 % of original dose 320 mg/m2), Intravenous,  Once, 3 of 3 cycles Dose modification: 320 mg/m2 (original dose 320 mg/m2, Cycle 4) Administration: 700 mg (11/02/2018), 700 mg (11/16/2018), 650 mg (11/30/2018) fosaprepitant (EMEND) 150 mg, dexamethasone (DECADRON) 12 mg in sodium chloride 0.9 %  145 mL IVPB, , Intravenous,  Once, 6 of 6 cycles Administration:   (08/16/2018),  (09/07/2018),  (10/19/2018),  (11/02/2018),  (11/16/2018),  (11/30/2018) fluorouracil (ADRUCIL) 5,400 mg in sodium chloride 0.9 % 142 mL chemo infusion, 2,400 mg/m2 = 5,400 mg, Intravenous, 1 Day/Dose, 6 of 6 cycles Dose modification: 1,920 mg/m2 (80 % of original dose 2,400 mg/m2, Cycle 3, Reason: Provider Judgment) Administration: 5,400 mg (08/16/2018), 5,400 mg (09/07/2018), 4,300 mg (10/19/2018), 4,300 mg (11/02/2018), 4,300 mg (11/16/2018), 3,850 mg (11/30/2018) bevacizumab-awwb (MVASI) 500 mg in sodium chloride 0.9 % 100 mL chemo infusion, 5 mg/kg = 500 mg (100 % of original dose 5 mg/kg), Intravenous,  Once, 6 of 6 cycles Dose modification: 5 mg/kg (original dose 5 mg/kg, Cycle 1), 5 mg/kg (original dose 5 mg/kg, Cycle 2) Administration: 500 mg (08/16/2018), 500 mg (09/07/2018), 500 mg (10/19/2018), 500 mg (11/02/2018), 450 mg (11/16/2018), 400 mg (11/30/2018)  for chemotherapy treatment.       Smith STAGING: Smith Staging Adenocarcinoma of sigmoid colon [facility]) Staging form: Colon and Rectum, AJCC 8th Edition - Pathologic stage from 04/01/2016: Stage IIA (pT3, pN0, cM0) - Signed by Troy Cancer, PA-C on 04/01/2016    INTERVAL HISTORY:  Troy Smith 63 y.o. male seen for follow-up of metastatic colon Smith.  Cycle 5 was on 11/16/2018.  Appetite is reported as 50%.  Energy level 75%.  He had mild diarrhea which was controlled well with Imodium.  Numbness in the feet has been stable.  Reports that there is improvement in memory with Aricept.  Denies any abdominal pains.   REVIEW OF SYSTEMS:  Review of Systems  Gastrointestinal: Positive for diarrhea.  Neurological: Positive for numbness.  All other systems reviewed and are negative.    PAST MEDICAL/SURGICAL HISTORY:  Past Medical History:  Diagnosis Date  . 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 Carpal tunnel syndrome 05/02/2014   Bilateral  . Cervical disc disorder with radiculopathy of cervical region 05/02/2014    Left C8  . Diabetes mellitus without complication ([place])   . Hypertension   . Port-A-Cath in place 08/10/2018   Past Surgical History:  Procedure Laterality Date  . BIOPSY  03/20/2016   Procedure: BIOPSY;  Surgeon: Troy Houston, MD;  Location: [facility] ENDO [facility];  Service: Endoscopy;;  colon  . BOWEL RESECTION N/A 03/23/2016   Procedure: LOW ANTERIOR RESECTION;  Surgeon: Aviva Signs, MD;  Location: [facility] ORS;  Service: General;  Laterality: N/A;  . COLONOSCOPY N/A 03/20/2016   Procedure: COLONOSCOPY;  Surgeon: Troy Houston, MD;  Location: [facility] ENDO SUITE;  Service: Endoscopy;  Laterality: N/A;  730  . COLONOSCOPY N/A 07/10/2016   Procedure: COLONOSCOPY;  Surgeon: Troy Houston, MD;  Location: [facility] ENDO SU[facility];  Service: Endoscopy;  Laterality: N/A;  1040  . NO PAST SURGERIES    . POLYPECTOMY  07/10/2016   Procedure: POLYPECTOMY;  Surgeon: Troy Houston, MD;  Location: [facility];  Service: Endoscopy;;  colon  . PORTACATH PLACEMENT Left 08/05/2018   Procedure: INSERTION PORT-A-CATH;  Surgeon: Aviva Signs, MD;  Location: [facility] ORS;  Service: General;  Laterality: Left;  . TONSILLECTOMY       SOCIAL HISTORY:  Social History   Socioeconomic History  . Marital status: Married    Spouse name: Not on file  . Number of children: Not on file  . Years of education: Not on file  . Highest education level: Not on file  Occupational History  . Not on file  Social Needs  .  Financial resource strain: Not on file  . Food insecurity    Worry: Not on file    Inability: Not on file  . Transportation needs    Medical: Not on file    Non-medical: Not on file  Tobacco Use  . Smoking status: Never Smoker  . Smokeless tobacco: Never Used  Substance and Sexual Activity  . Alcohol use: Yes    Comment: rarely  . Drug use: No  . Sexual activity: Not on file  Lifestyle  . Physical activity    Days per week: Not on file    Minutes per session: Not on file  . Stress: Not on file  Relationships  .  Social Herbalist on phone: Not on file    Gets together: Not on file    Attends religious service: Not on file    Active member of club or organization: Not on file    Attends meetings of clubs or organizations: Not on file    Relationship status: Not on file  . Intimate partner violence    Fear of current or ex partner: Not on file    Emotionally abused: Not on file    Physically abused: Not on file    Forced sexual activity: Not on file  Other Topics Concern  . Not on file  Social History Narrative  . Not on file    FAMILY HISTORY:  Family History  Problem Relation Age of Onset  . COPD Mother   . Melanoma Father     CURRENT MEDICATIONS:  Outpatient Encounter Medications as of 11/30/2018  Medication Sig  . allopurinol (ZYLOPRIM) 300 MG tablet Take 1 tablet (300 mg total) by mouth daily.  Marland Kitchen amLODipine (NORVASC) 10 MG tablet Take 10 mg by mouth daily.  . Bevacizumab (AVASTIN IV) Inject into the vein every 14 (fourteen) days.  Marland Kitchen dextrose 5 % SOLN 1,000 mL with fluorouracil 5 GM/100ML SOLN Inject into the vein over 48 hr.  . donepezil (ARICEPT) 10 MG tablet Take 1 tablet (10 mg total) by mouth at bedtime.  Marland Kitchen FLUOROURACIL IV Inject into the vein every 14 (fourteen) days.  . insulin glargine (LANTUS) 100 UNIT/ML injection Inject 30 Units into the skin at bedtime.   . IRINOTECAN HCL IV Inject into the vein every 14 (fourteen) days.  Marland Kitchen LEUCOVORIN CALCIUM IV Inject into the vein every 14 (fourteen) days.  . metoprolol succinate (TOPROL-XL) 50 MG 24 hr tablet Take 50 mg by mouth daily. Take with or immediately following a meal.  . OXALIPLATIN IV Inject into the vein every 14 (fourteen) days.  . potassium chloride SA (K-DUR) 20 MEQ tablet Take 1 tablet (20 mEq total) by mouth daily.  . diphenoxylate-atropine (LOMOTIL) 2.5-0.025 MG tablet Take 1 tablet by mouth 4 (four) times daily as needed for diarrhea or loose stools. (Patient not taking: Reported on 10/26/2018)  .  HYDROcodone-acetaminophen (NORCO) 5-325 MG tablet Take 1 tablet by mouth every 4 (four) hours as needed for moderate pain. (Patient not taking: Reported on 10/26/2018)  . lidocaine-prilocaine (EMLA) cream Apply a small amount to port a cath site and cover with plastic wrap 1 hour prior to chemotherapy appointments (Patient not taking: Reported on 10/26/2018)  . loperamide (IMODIUM A-D) 2 MG tablet Take 2 at onset of diarrhea, then 1 after each watery bowel movement. (Patient not taking: Reported on 10/26/2018)  . magic mouthwash w/lidocaine SOLN Take 5 mLs by mouth 4 (four) times daily as needed for mouth pain. (  Patient not taking: Reported on 10/26/2018)  . prochlorperazine (COMPAZINE) 10 MG tablet Take 1 tablet (10 mg total) by mouth every 6 (six) hours as needed (Nausea or vomiting). (Patient not taking: Reported on 11/02/2018)  . scopolamine (TRANSDERM-SCOP) 1 MG/3DAYS Place 1 patch (1.5 mg total) onto the skin every 3 (three) days. (Patient not taking: Reported on 10/26/2018)  . [DISCONTINUED] sulfamethoxazole-trimethoprim (BACTRIM DS) 800-160 MG tablet Take 1 tablet by mouth 2 (two) times daily.   No facility-administered encounter medications on file as of 11/30/2018.     ALLERGIES:  Allergies  Allergen Reactions  . Penicillin G Nausea And Vomiting     PHYSICAL EXAM:  ECOG Performance status: 1  Vital signs: Blood pressure is 130/75.  Pulse rate is 80.  Respiratory is 18.  Temperature 98.  Physical Exam Cardiovascular:     Rate and Rhythm: Normal rate and regular rhythm.     Heart sounds: Normal heart sounds.  Pulmonary:     Effort: Pulmonary effort is normal.     Breath sounds: Normal breath sounds.  Abdominal:     General: There is no distension.     Palpations: Abdomen is soft. There is no mass.  Musculoskeletal: Normal range of motion.  Skin:    General: Skin is warm and dry.  Neurological:     Mental Status: He is alert and oriented to person, place, and time. Mental  status is at baseline.  Psychiatric:        Mood and Affect: Mood normal.        Behavior: Behavior normal.        Thought Content: Thought content normal.        Judgment: Judgment normal.      LABORATORY DATA:  I have reviewed the labs as listed.  CBC    Component Value Date/Time   WBC 7.9 11/30/2018 0813   RBC 3.54 (L) 11/30/2018 0813   HGB 10.1 (L) 11/30/2018 0813   HCT 31.7 (L) 11/30/2018 0813   PLT 230 11/30/2018 0813   MCV 89.5 11/30/2018 0813   MCH 28.5 11/30/2018 0813   MCHC 31.9 11/30/2018 0813   RDW 16.2 (H) 11/30/2018 0813   LYMPHSABS 2.1 11/30/2018 0813   MONOABS 0.5 11/30/2018 0813   EOSABS 0.2 11/30/2018 0813   BASOSABS 0.0 11/30/2018 0813   CMP Latest Ref Rng & Units 11/30/2018 11/16/2018 11/09/2018  Glucose 70 - 99 mg/dL 85 185(H) 178(H)  BUN 8 - 23 mg/dL 28(H) 14 10  Creatinine 0.61 - 1.24 mg/dL 1.66(H) 1.05 1.16  Sodium 135 - 145 mmol/L 136 138 141  Potassium 3.5 - 5.1 mmol/L 3.8 3.7 4.5  Chloride 98 - 111 mmol/L 106 104 106  CO2 22 - 32 mmol/L 20(L) 23 24  Calcium 8.9 - 10.3 mg/dL 9.3 8.9 9.5  Total Protein 6.5 - 8.1 g/dL 6.3(L) 6.0(L) 6.4(L)  Total Bilirubin 0.3 - 1.2 mg/dL 0.6 0.5 0.3  Alkaline Phos 38 - 126 U/L 95 89 160(H)  AST 15 - 41 U/L 16 18 17   ALT 0 - 44 U/L 14 18 13      I have independently reviewed scans and discussed with the patient.  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  Metastatic sigmoid colon Smith to the liver: -Foundation 1 shows MS-stable, RAS-wild-type. - Cycle 1 of FOLFOXIRI and bevacizumab on 08/16/2018. -Cycle 1 was tolerated very poorly with severe nausea and diarrhea and worsening renal function. 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 -Cycle 2 of FOLFOX on 09/07/2018 with oxaliplatin dose reduced by 20%. - He had watery diarrhea and some soreness in the mouth along with nausea after cycle 2.  Cycle 3 was held on 09/22/2018  secondary to gout of right hand middle finger and right knee. - Cycle 3 of FOLFOX with 20% dose reduction of infusional 5-FU on 10/19/2018. - Cycle 4 on 11/02/2018.  Today he feels much better.  He has not lost any weight. --His CEA on 11/09/2018 has normalized to 3.2.  He has intermittent numbness in the fingertips which is not affecting any of his activities. - Labs are acceptable to proceed with Cycle 6 today. Serum creatinine is elevated at 1.66 secondary to prescribed Bactrim for treatment of a furuncle. We will administer 1L NS today in clinic and 500 mL on Friday.  - We will schedule him for restaging CT CAP. - He will be evaluated by hepatobillary surgeron, Dr. Crisoforo Oxford in 2 weeks for surgery evaluation. - RTC in 3 weeks.  2.  Gout: -He will continue allopurinol.  His joints improved after Medrol Dosepak at last visit.  3.  Cognitive dysfunction: - He had developed memory problems.  Aricept 5 mg started around 10/24/2018. - Aricept was increased to 10 mg daily on 11/09/2018.  There is mild improvement in memory problems.  4.  CKD: -Creatinine has been stable around 1.1.   Total time spent is 25 minutes with more than 50% of the time spent face-to-face discussing treatment plan, counseling and coordination of care.  Orders placed this encounter:  Orders Placed This Encounter  Procedures  . CT Abdomen Pelvis W Contrast  . CT Chest W Contrast     Derek Jack, MD [place] [phone]

## 2018-11-30 NOTE — Progress Notes (Signed)
0945 Lab and urine results reviewed with and pt seen by Dr. Delton Coombes and pt approved for chemo tx today with added hydration 1 liter NS over 2 hours today and Friday when pump is removed due to elevated BUN and Creatinine per MD                                                                    Troy Smith tolerated chemo tx well without complaints or incident. Pt discharged with 5FU pump infusing without issues. VSS upon discharge. Pt discharged self ambulatory in satisfactory condition accompanied by his wife

## 2018-12-02 ENCOUNTER — Other Ambulatory Visit: Payer: Self-pay

## 2018-12-02 ENCOUNTER — Encounter (HOSPITAL_COMMUNITY): Payer: Self-pay

## 2018-12-02 ENCOUNTER — Encounter (HOSPITAL_COMMUNITY): Payer: 59

## 2018-12-02 ENCOUNTER — Inpatient Hospital Stay (HOSPITAL_COMMUNITY): Payer: 59

## 2018-12-02 VITALS — BP 119/80 | HR 57 | Temp 96.6°F | Resp 18

## 2018-12-02 DIAGNOSIS — Z5112 Encounter for antineoplastic immunotherapy: Secondary | ICD-10-CM | POA: Diagnosis not present

## 2018-12-02 DIAGNOSIS — C187 Malignant neoplasm of sigmoid colon: Secondary | ICD-10-CM

## 2018-12-02 DIAGNOSIS — Z95828 Presence of other vascular implants and grafts: Secondary | ICD-10-CM

## 2018-12-02 MED ORDER — PEGFILGRASTIM INJECTION 6 MG/0.6ML ~~LOC~~
6.0000 mg | PREFILLED_SYRINGE | Freq: Once | SUBCUTANEOUS | Status: AC
  Administered 2018-12-02: 6 mg via SUBCUTANEOUS
  Filled 2018-12-02: qty 0.6

## 2018-12-02 MED ORDER — HEPARIN SOD (PORK) LOCK FLUSH 100 UNIT/ML IV SOLN
500.0000 [IU] | Freq: Once | INTRAVENOUS | Status: AC | PRN
  Administered 2018-12-02: 500 [IU]

## 2018-12-02 MED ORDER — SODIUM CHLORIDE 0.9% FLUSH
10.0000 mL | INTRAVENOUS | Status: DC | PRN
  Administered 2018-12-02: 13:00:00 10 mL
  Filled 2018-12-02: qty 10

## 2018-12-02 MED ORDER — SODIUM CHLORIDE 0.9 % IV SOLN
INTRAVENOUS | Status: AC
  Administered 2018-12-02: 13:00:00 via INTRAVENOUS

## 2018-12-02 NOTE — Patient Instructions (Signed)
[place] at [facility] Discharge Instructions  5FU pump discontinued with portacath flush as well as Neulasta injection and hydration. Follow-up as scheduled. Call clinic for any questions or concerns   Thank you for choosing [place] at [facility] to provide your oncology and hematology care.  To afford each patient quality time with our provider, please arrive at least 15 minutes before your scheduled appointment time.   If you have a lab appointment with the [facility] please come in thru the Main Entrance and check in at the main information desk.  You need to re-schedule your appointment should you arrive 10 or more minutes late.  We strive to give you quality time with our providers, and arriving late affects you and other patients whose appointments are after yours.  Also, if you no show three or more times for appointments you may be dismissed from the clinic at the providers discretion.     Again, thank you for choosing [facility].  Our hope is that these requests will decrease the amount of time that you wait before being seen by our physicians.       _____________________________________________________________  Should you have questions after your visit to [facility], please contact our office at [phone] between the hours of 8:00 a.m. and 4:30 p.m.  Voicemails left after 4:00 p.m. will not be returned until the following business day.  For prescription refill requests, have your pharmacy contact our office and allow 72 hours.    Due to Covid, you will need to wear a mask upon entering the hospital. If you do not have a mask, a mask will be given to you at the Main Entrance upon arrival. For doctor visits, patients may have 1 support person with them. For treatment visits, patients can not have anyone with them due to social distancing guidelines and our immunocompromised population.

## 2018-12-02 NOTE — Progress Notes (Signed)
Troy Smith tolerated 5FU pump well without complaints or incident. 5FU pump discontinued with portacath flushed per protocol. Pt given Neulasta injection and hydration today and tolerated well. VSS upon discharge. Pt discharged self ambulatory in satisfactory condition

## 2018-12-07 ENCOUNTER — Other Ambulatory Visit: Payer: Self-pay

## 2018-12-07 ENCOUNTER — Ambulatory Visit (HOSPITAL_COMMUNITY)
Admission: RE | Admit: 2018-12-07 | Discharge: 2018-12-07 | Disposition: A | Discharge status: Home / Self Care | Payer: 59 | Source: Ambulatory Visit | Attending: Hematology | Admitting: Hematology

## 2018-12-07 DIAGNOSIS — C187 Malignant neoplasm of sigmoid colon: Secondary | ICD-10-CM | POA: Insufficient documentation

## 2018-12-07 MED ORDER — IOHEXOL 300 MG/ML  SOLN
75.0000 mL | Freq: Once | INTRAMUSCULAR | Status: AC | PRN
  Administered 2018-12-07: 75 mL via INTRAVENOUS

## 2018-12-10 ENCOUNTER — Encounter (HOSPITAL_COMMUNITY): Payer: Self-pay | Admitting: Hematology

## 2018-12-21 ENCOUNTER — Encounter (HOSPITAL_COMMUNITY): Payer: Self-pay | Admitting: Hematology

## 2018-12-21 ENCOUNTER — Inpatient Hospital Stay (HOSPITAL_COMMUNITY): Payer: 59 | Attending: Hematology | Admitting: Hematology

## 2018-12-21 ENCOUNTER — Other Ambulatory Visit: Payer: Self-pay

## 2018-12-21 ENCOUNTER — Inpatient Hospital Stay (HOSPITAL_COMMUNITY): Payer: 59

## 2018-12-21 DIAGNOSIS — C187 Malignant neoplasm of sigmoid colon: Secondary | ICD-10-CM

## 2018-12-21 DIAGNOSIS — N189 Chronic kidney disease, unspecified: Secondary | ICD-10-CM | POA: Insufficient documentation

## 2018-12-21 DIAGNOSIS — C787 Secondary malignant neoplasm of liver and intrahepatic bile duct: Secondary | ICD-10-CM | POA: Diagnosis not present

## 2018-12-21 DIAGNOSIS — M109 Gout, unspecified: Secondary | ICD-10-CM | POA: Insufficient documentation

## 2018-12-21 LAB — CBC WITH DIFFERENTIAL/PLATELET
Abs Immature Granulocytes: 0.04 10*3/uL (ref 0.00–0.07)
Basophils Absolute: 0 10*3/uL (ref 0.0–0.1)
Basophils Relative: 1 %
Eosinophils Absolute: 0.2 10*3/uL (ref 0.0–0.5)
Eosinophils Relative: 2 %
HCT: 30.5 % — ABNORMAL LOW (ref 39.0–52.0)
Hemoglobin: 9.6 g/dL — ABNORMAL LOW (ref 13.0–17.0)
Immature Granulocytes: 1 %
Lymphocytes Relative: 23 %
Lymphs Abs: 1.7 10*3/uL (ref 0.7–4.0)
MCH: 29.2 pg (ref 26.0–34.0)
MCHC: 31.5 g/dL (ref 30.0–36.0)
MCV: 92.7 fL (ref 80.0–100.0)
Monocytes Absolute: 0.4 10*3/uL (ref 0.1–1.0)
Monocytes Relative: 6 %
Neutro Abs: 5.2 10*3/uL (ref 1.7–7.7)
Neutrophils Relative %: 67 %
Platelets: 248 10*3/uL (ref 150–400)
RBC: 3.29 MIL/uL — ABNORMAL LOW (ref 4.22–5.81)
RDW: 17.2 % — ABNORMAL HIGH (ref 11.5–15.5)
WBC: 7.5 10*3/uL (ref 4.0–10.5)
nRBC: 0 % (ref 0.0–0.2)

## 2018-12-21 LAB — COMPREHENSIVE METABOLIC PANEL
ALT: 15 U/L (ref 0–44)
AST: 17 U/L (ref 15–41)
Albumin: 2.9 g/dL — ABNORMAL LOW (ref 3.5–5.0)
Alkaline Phosphatase: 79 U/L (ref 38–126)
Anion gap: 9 (ref 5–15)
BUN: 13 mg/dL (ref 8–23)
CO2: 24 mmol/L (ref 22–32)
Calcium: 9.3 mg/dL (ref 8.9–10.3)
Chloride: 109 mmol/L (ref 98–111)
Creatinine, Ser: 1.06 mg/dL (ref 0.61–1.24)
GFR calc Af Amer: >60 mL/min (ref >60)
GFR calc non Af Amer: >60 mL/min (ref >60)
Glucose, Bld: 144 mg/dL — ABNORMAL HIGH (ref 70–99)
Potassium: 4.6 mmol/L (ref 3.5–5.1)
Sodium: 142 mmol/L (ref 135–145)
Total Bilirubin: 0.7 mg/dL (ref 0.3–1.2)
Total Protein: 5.7 g/dL — ABNORMAL LOW (ref 6.5–8.1)

## 2018-12-21 NOTE — Assessment & Plan Note (Signed)
1.  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 - Cycle 1 of FOLFOXIRI and bevacizumab on 08/16/2018. -Cycle 1 was tolerated very poorly with severe nausea and diarrhea and worsening renal function. 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 -6 cycles of dose reduced FOLFOX with bevacizumab completed on 11/30/2018. -We reviewed results of the CT scan dated 11/29/2018 which showed liver mass measuring 2.1 x 1.8 cm, previously 4.5 x 3.3 cm.  Unchanged prominent subcentimeter perirectal and pelvic sidewall lymph nodes measuring up to 8 mm.  No new evidence of metastatic disease in the chest, abdomen or pelvis.  Splenomegaly measuring 16.5 cm. -He was evaluated by Dr. Crisoforo Oxford.  He will have endorectal ultrasound by Dr. Jerene Pitch on the 20th of this month. -He will be reevaluated by Dr. Crisoforo Oxford for surgery on 01/10/2019. -We will hold off on his treatments at this time.  I will see him back in 4 weeks for follow-up.  2.  Gout: -He will continue allopurinol.  He does not report any pain in the joints.  3.  Cognitive dysfunction: - He had developed memory problems.  Aricept 5 mg started around 10/24/2018. - Aricept was increased to 10 mg daily on 11/09/2018.  There is mild improvement in memory problems.  4.  CKD: -Creatinine has been stable around 1.1.

## 2018-12-21 NOTE — Progress Notes (Signed)
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 Focal narrowing and wall thickening of the sigmoid colon is noted in this area most likely due to inflammation, but neoplasm cannot be excluded, and sigmoidoscopy is recommended for further evaluation.   03/20/2016 Procedure   Colonoscopy by Dr. Laural Golden- Malignant partially obstructing tumor in the distal sigmoid colon. Biopsied. - Incomplete exam.   03/23/2016 Pathology Results   Colon, biopsy, sigmoid mass - ADENOCARCINOMA.   03/23/2016 Procedure   Low anterior resection with transanal anastomosis   03/26/2016 Pathology Results   Colon, segmental resection for tumor, sigmoid - INVASIVE WELL DIFFERENTIATED ADENOCARCINOMA, SPANNING 5 CM IN GREATEST DIMENSION. - TUMOR INVADES THROUGH MUSCULARIS PROPRIA TO INVOLVE SUBSEROSAL SOFT TISSUES. - UNDERLYING MARKED ACUTE AND CHRONIC INFLAMMATION WITH GIANT CELL REACTION AND ABSCESS FORMATION EXTENDING TO THE SEROSAL SURFACE AND INVOLVING THE MESENTERIC TISSUE. - MARGINS ARE NEGATIVE FOR TUMOR. - FOURTEEN BENIGN LYMPH NODES WHICH DEMONSTRATE VARIABLE INVOLVEMENT WITH ACUTE AND CHRONIC INFLAMMATION WITH ASSOCIATED GIANT CELLS WITHOUT TUMOR IDENTIFIED (0/14).   04/08/2016 Cancer Staging   Cancer Staging Adenocarcinoma of sigmoid colon [facility]) Staging form: Colon and Rectum, AJCC 8th Edition - Pathologic stage from 04/01/2016: Stage IIA (pT3, pN0, cM0) - Signed by Baird Cancer, PA-C on 04/01/2016    07/09/2016 Imaging   CT abd/pelvis- No evidence of metastatic disease or  other acute findings.  Bilateral nonobstructing renal calculi and mild bilateral renal parenchymal scarring.  Stable mildly enlarged prostate gland and findings of chronic bladder outlet obstruction.   08/16/2018 -  Chemotherapy   The patient had palonosetron (ALOXI) injection 0.25 mg, 0.25 mg, Intravenous,  Once, 6 of 6 cycles Administration: 0.25 mg (08/16/2018), 0.25 mg (09/07/2018), 0.25 mg (10/19/2018), 0.25 mg (11/02/2018), 0.25 mg (11/16/2018), 0.25 mg (11/30/2018) pegfilgrastim (NEULASTA) injection 6 mg, 6 mg, Subcutaneous, Once, 6 of 6 cycles Administration: 6 mg (08/18/2018), 6 mg (09/09/2018), 6 mg (10/21/2018), 6 mg (11/04/2018), 6 mg (11/18/2018) irinotecan (CAMPTOSAR) 380 mg in sodium chloride 0.9 % 500 mL chemo infusion, 165 mg/m2 = 380 mg, Intravenous,  Once, 1 of 1 cycle Administration: 380 mg (08/16/2018) leucovorin 900 mg in dextrose 5 % 250 mL infusion, 400 mg/m2 = 900 mg, Intravenous,  Once, 6 of 6 cycles Administration: 900 mg (08/16/2018), 900 mg (09/07/2018), 900 mg (10/19/2018), 900 mg (11/02/2018), 900 mg (11/16/2018), 800 mg (11/30/2018) oxaliplatin (ELOXATIN) 190 mg in dextrose 5 % 500 mL chemo infusion, 85 mg/m2 = 190 mg, Intravenous,  Once, 6 of 6 cycles Dose modification: 68 mg/m2 (80 % of original dose 85 mg/m2, Cycle 2, Reason: Provider Judgment), 68 mg/m2 (80 % of original dose 85 mg/m2, Cycle 3, Reason: Provider Judgment) Administration: 190 mg (08/16/2018), 150 mg (09/07/2018), 150 mg (10/19/2018), 150 mg (11/16/2018), 150 mg (11/02/2018), 135 mg (11/30/2018) fluorouracil (ADRUCIL) chemo injection 700 mg, 320 mg/m2 = 700 mg (100 % of original dose 320 mg/m2), Intravenous,  Once, 3 of 3 cycles Dose modification: 320 mg/m2 (original dose 320 mg/m2, Cycle 4) Administration: 700 mg (11/02/2018), 700 mg (11/16/2018), 650 mg (11/30/2018) fosaprepitant (EMEND) 150 mg, dexamethasone (DECADRON) 12 mg in sodium chloride 0.9 %  145 mL IVPB, , Intravenous,  Once, 6 of 6 cycles Administration:   (08/16/2018),  (09/07/2018),  (10/19/2018),  (11/02/2018),  (11/16/2018),  (11/30/2018) fluorouracil (ADRUCIL) 5,400 mg in sodium chloride 0.9 % 142 mL chemo infusion, 2,400 mg/m2 = 5,400 mg, Intravenous, 1 Day/Dose, 6 of 6 cycles Dose modification: 1,920 mg/m2 (80 % of original dose 2,400 mg/m2, Cycle 3, Reason: Provider Judgment) Administration: 5,400 mg (08/16/2018), 5,400 mg (09/07/2018), 4,300 mg (10/19/2018), 4,300 mg (11/02/2018), 4,300 mg (11/16/2018), 3,850 mg (11/30/2018) bevacizumab-awwb (MVASI) 500 mg in sodium chloride 0.9 % 100 mL chemo infusion, 5 mg/kg = 500 mg (100 % of original dose 5 mg/kg), Intravenous,  Once, 6 of 6 cycles Dose modification: 5 mg/kg (original dose 5 mg/kg, Cycle 1), 5 mg/kg (original dose 5 mg/kg, Cycle 2) Administration: 500 mg (08/16/2018), 500 mg (09/07/2018), 500 mg (10/19/2018), 500 mg (11/02/2018), 450 mg (11/16/2018), 400 mg (11/30/2018)  for chemotherapy treatment.       CANCER STAGING: Cancer Staging Adenocarcinoma of sigmoid colon [facility]) Staging form: Colon and Rectum, AJCC 8th Edition - Pathologic stage from 04/01/2016: Stage IIA (pT3, pN0, cM0) - Signed by Baird Cancer, PA-C on 04/01/2016    INTERVAL HISTORY:  Troy Smith 63 y.o. male seen for follow-up of metastatic colon cancer.  He completed cycle 6 on 11/30/2018.  Did not experience any major side effects including nausea, vomiting, diarrhea or constipation.  Leg swellings have been stable.  Appetite is 100%.  Energy levels are 75%.  He was evaluated at [facility] for possible resection.  He had CT scan of the abdomen and pelvis done on 12/07/2018.  Denies any bleeding issues.  REVIEW OF SYSTEMS:  Review of Systems  Cardiovascular: Positive for leg swelling.  All other systems reviewed and are negative.    PAST MEDICAL/SURGICAL HISTORY:  Past Medical History:  Diagnosis Date  . 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 Carpal tunnel syndrome 05/02/2014   Bilateral  . Cervical  disc disorder with radiculopathy of cervical region 05/02/2014   Left C8  . Diabetes mellitus without complication ([place])   . Hypertension   . Port-A-Cath in place 08/10/2018   Past Surgical History:  Procedure Laterality Date  . BIOPSY  03/20/2016   Procedure: BIOPSY;  Surgeon: Rogene Houston, MD;  Location[facility];  Service: Endoscopy;;  colon  . BOWEL RESECTION N/A 03/23/2016   Procedure: LOW ANTERIOR RESECTION;  Surgeon: Aviva Signs, MD;  Location: [facility] ORS;  Service: General;  Laterality: N/A;  . COLONOSCOPY N/A 03/20/2016   Procedure: COLONOSCOPY;  Surgeon: Rogene Houston, MD;  Location: [facility];  Service: Endoscopy;  Laterality: N/A;  730  . COLONOSCOPY N/A 07/10/2016   Procedure: COLONOSCOPY;  Surgeon: Rogene Houston, MD;  Location: [facility];  Service: Endoscopy;  Laterality: N/A;  1040  . NO PAST SURGERIES    . POLYPECTOMY  07/10/2016   Procedure: POLYPECTOMY;  Surgeon: Rogene Houston, MD;  Location: [facility];  Service: Endoscopy;;  colon  . PORTACATH PLACEMENT Left 08/05/2018   Procedure: INSERTION PORT-A-CATH;  Surgeon: Aviva Signs, MD;  Location: [facility] ORS;  Service: General;  Laterality: Left;  . TONSILLECTOMY       SOCIAL HISTORY:  Social History   Socioeconomic History  . Marital status: Married    Spouse name: Not on file  . Number of children: Not on file  . Years of education: Not on file  . Highest education level: Not on file  Occupational History  . Not on file  Social Needs  . Financial resource strain: Not on file  . Food insecurity    Worry: Not on file    Inability: Not on file  . Transportation needs    Medical: Not on file    Non-medical: Not on file  Tobacco Use  . Smoking status: Never Smoker  . Smokeless tobacco: Never Used  Substance and Sexual Activity  . Alcohol use: Yes    Comment: rarely  . Drug use: No  . Sexual activity: Not on file  Lifestyle  . Physical activity    Days per week: Not on file    Minutes per  session: Not on file  . Stress: Not on file  Relationships  . Social Herbalist on phone: Not on file    Gets together: Not on file    Attends religious service: Not on file    Active member of club or organization: Not on file    Attends meetings of clubs or organizations: Not on file    Relationship status: Not on file  . Intimate partner violence    Fear of current or ex partner: Not on file    Emotionally abused: Not on file    Physically abused: Not on file    Forced sexual activity: Not on file  Other Topics Concern  . Not on file  Social History Narrative  . Not on file    FAMILY HISTORY:  Family History  Problem Relation Age of Onset  . COPD Mother   . Melanoma Father     CURRENT MEDICATIONS:  Outpatient Encounter Medications as of 12/21/2018  Medication Sig  . allopurinol (ZYLOPRIM) 300 MG tablet Take 1 tablet (300 mg total) by mouth daily.  Marland Kitchen amLODipine (NORVASC) 10 MG tablet Take 10 mg by mouth daily.  . Bevacizumab (AVASTIN IV) Inject into the vein every 14 (fourteen) days.  Marland Kitchen dextrose 5 % SOLN 1,000 mL with fluorouracil 5 GM/100ML SOLN Inject into the vein over 48 hr.  . DIPHENHIST 12.5 MG/5ML liquid SWISH AND SPIT/SWALLOWOONE TEASPOONFUL BY MOUTH 4 TIMES DAILY AS NEEEDE FOR MOUTH PAIN.  Marland Kitchen donepezil (ARICEPT) 10 MG tablet Take 1 tablet (10 mg total) by mouth at bedtime.  Marland Kitchen FLUOROURACIL IV Inject into the vein every 14 (fourteen) days.  . insulin glargine (LANTUS) 100 UNIT/ML injection Inject 30 Units into the skin at bedtime.   . IRINOTECAN HCL IV Inject into the vein every 14 (fourteen) days.  Marland Kitchen LEUCOVORIN CALCIUM IV Inject into the vein every 14 (fourteen) days.  . metoprolol succinate (TOPROL-XL) 50 MG 24 hr tablet Take 50 mg by mouth daily. Take with or immediately following a meal.  . MITIGARE 0.6 MG CAPS Take 1 capsule by mouth 2 (two) times daily.  . OXALIPLATIN IV Inject into the vein every 14 (fourteen) days.  . potassium chloride SA (K-DUR)  20 MEQ tablet Take 1 tablet (20 mEq total) by mouth daily.  . QC ANTACID/ANTI-GAS 200-200-20 MG/5ML suspension SMARTSIG:1 Teaspoon By Mouth 4 Times Daily PRN  . diphenoxylate-atropine (LOMOTIL) 2.5-0.025 MG tablet Take 1 tablet by mouth 4 (four) times daily as needed for diarrhea or loose stools. (Patient not taking: Reported on 10/26/2018)  . HYDROcodone-acetaminophen (NORCO) 5-325 MG tablet Take 1 tablet by mouth every 4 (four) hours as needed for moderate pain. (Patient not taking: Reported on 10/26/2018)  . lidocaine (XYLOCAINE) 2 % solution SMARTSIG:1 Teaspoon By Mouth 4 Times Daily PRN  .  lidocaine-prilocaine (EMLA) cream Apply a small amount to port a cath site and cover with plastic wrap 1 hour prior to chemotherapy appointments (Patient not taking: Reported on 12/21/2018)  . loperamide (IMODIUM A-D) 2 MG tablet Take 2 at onset of diarrhea, then 1 after each watery bowel movement. (Patient not taking: Reported on 10/26/2018)  . magic mouthwash w/lidocaine SOLN Take 5 mLs by mouth 4 (four) times daily as needed for mouth pain. (Patient not taking: Reported on 10/26/2018)  . prochlorperazine (COMPAZINE) 10 MG tablet Take 1 tablet (10 mg total) by mouth every 6 (six) hours as needed (Nausea or vomiting). (Patient not taking: Reported on 11/02/2018)  . scopolamine (TRANSDERM-SCOP) 1 MG/3DAYS Place 1 patch (1.5 mg total) onto the skin every 3 (three) days. (Patient not taking: Reported on 10/26/2018)   No facility-administered encounter medications on file as of 12/21/2018.     ALLERGIES:  Allergies  Allergen Reactions  . Penicillin G Nausea And Vomiting     PHYSICAL EXAM:  ECOG Performance status: 1  Vital signs: Blood pressure is 125/82.  Pulse rate is 79.  Respirate is 18.  Temperature 98.  Saturations 100.  Physical Exam Cardiovascular:     Rate and Rhythm: Normal rate and regular rhythm.     Heart sounds: Normal heart sounds.  Pulmonary:     Effort: Pulmonary effort is normal.      Breath sounds: Normal breath sounds.  Abdominal:     General: There is no distension.     Palpations: Abdomen is soft. There is no mass.  Musculoskeletal: Normal range of motion.  Skin:    General: Skin is warm and dry.  Neurological:     Mental Status: He is alert and oriented to person, place, and time. Mental status is at baseline.  Psychiatric:        Mood and Affect: Mood normal.        Behavior: Behavior normal.        Thought Content: Thought content normal.        Judgment: Judgment normal.      LABORATORY DATA:  I have reviewed the labs as listed.  CBC    Component Value Date/Time   WBC 7.5 12/21/2018 0825   RBC 3.29 (L) 12/21/2018 0825   HGB 9.6 (L) 12/21/2018 0825   HCT 30.5 (L) 12/21/2018 0825   PLT 248 12/21/2018 0825   MCV 92.7 12/21/2018 0825   MCH 29.2 12/21/2018 0825   MCHC 31.5 12/21/2018 0825   RDW 17.2 (H) 12/21/2018 0825   LYMPHSABS 1.7 12/21/2018 0825   MONOABS 0.4 12/21/2018 0825   EOSABS 0.2 12/21/2018 0825   BASOSABS 0.0 12/21/2018 0825   CMP Latest Ref Rng & Units 12/21/2018 11/30/2018 11/16/2018  Glucose 70 - 99 mg/dL 144(H) 85 185(H)  BUN 8 - 23 mg/dL 13 28(H) 14  Creatinine 0.61 - 1.24 mg/dL 1.06 1.66(H) 1.05  Sodium 135 - 145 mmol/L 142 136 138  Potassium 3.5 - 5.1 mmol/L 4.6 3.8 3.7  Chloride 98 - 111 mmol/L 109 106 104  CO2 22 - 32 mmol/L 24 20(L) 23  Calcium 8.9 - 10.3 mg/dL 9.3 9.3 8.9  Total Protein 6.5 - 8.1 g/dL 5.7(L) 6.3(L) 6.0(L)  Total Bilirubin 0.3 - 1.2 mg/dL 0.7 0.6 0.5  Alkaline Phos 38 - 126 U/L 79 95 89  AST 15 - 41 U/L 17 16 18   ALT 0 - 44 U/L 15 14 18      I have independently reviewed scans and discussed  with the patient.  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 - Cycle 1 of FOLFOXIRI and bevacizumab on 08/16/2018. -Cycle 1 was tolerated very poorly with severe nausea and diarrhea and worsening  renal function. 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 -6 cycles of dose reduced FOLFOX with bevacizumab completed on 11/30/2018. -We reviewed results of the CT scan dated 11/29/2018 which showed liver mass measuring 2.1 x 1.8 cm, previously 4.5 x 3.3 cm.  Unchanged prominent subcentimeter perirectal and pelvic sidewall lymph nodes measuring up to 8 mm.  No new evidence of metastatic disease in the chest, abdomen or pelvis.  Splenomegaly measuring 16.5 cm. -He was evaluated by Dr. Crisoforo Oxford.  He will have endorectal ultrasound by Dr. Jerene Pitch on the 20th of this month. -He will be reevaluated by Dr. Crisoforo Oxford for surgery on 01/10/2019. -We will hold off on his treatments at this time.  I will see him back in 4 weeks for follow-up.  2.  Gout: -He will continue allopurinol.  He does not report any pain in the joints.  3.  Cognitive dysfunction: - He had developed memory problems.  Aricept 5 mg started around 10/24/2018. - Aricept was increased to 10 mg daily on 11/09/2018.  There is mild improvement in memory problems.  4.  CKD: -Creatinine has been stable around 1.1.   Total time spent is 25 minutes with more than 50% of the time spent face-to-face discussing treatment plan, counseling and coordination of care.  Orders placed this encounter:  No orders of the defined types were placed in this encounter.    Derek Jack, MD [place] [phone]

## 2018-12-21 NOTE — Patient Instructions (Signed)
[place] at [facility] Discharge Instructions  You were seen today by Dr. Delton Coombes. He went over your recent lab and scan results. He will see you back the second week of December for labs and follow up.   Thank you for choosing [place] at [facility] to provide your oncology and hematology care.  To afford each patient quality time with our provider, please arrive at least 15 minutes before your scheduled appointment time.   If you have a lab appointment with the [facility] please come in thru the  Main Entrance and check in at the main information desk  You need to re-schedule your appointment should you arrive 10 or more minutes late.  We strive to give you quality time with our providers, and arriving late affects you and other patients whose appointments are after yours.  Also, if you no show three or more times for appointments you may be dismissed from the clinic at the providers discretion.     Again, thank you for choosing [facility].  Our hope is that these requests will decrease the amount of time that you wait before being seen by our physicians.       _____________________________________________________________  Should you have questions after your visit to [facility], please contact our office at [phone] between the hours of 8:00 a.m. and 4:30 p.m.  Voicemails left after 4:00 p.m. will not be returned until the following business day.  For prescription refill requests, have your pharmacy contact our office and allow 72 hours.    Cancer Center Support Programs:   > Cancer Support Group  2nd Tuesday of the month 1pm-2pm, Journey Room

## 2019-01-10 HISTORY — PX: COLON SURGERY: SHX602

## 2019-01-16 ENCOUNTER — Inpatient Hospital Stay (HOSPITAL_COMMUNITY): Payer: 59 | Attending: Hematology | Admitting: Hematology

## 2019-01-16 ENCOUNTER — Encounter (HOSPITAL_COMMUNITY): Payer: Self-pay | Admitting: Hematology

## 2019-01-16 ENCOUNTER — Inpatient Hospital Stay (HOSPITAL_COMMUNITY): Payer: 59

## 2019-01-16 ENCOUNTER — Other Ambulatory Visit: Payer: Self-pay

## 2019-01-16 DIAGNOSIS — C187 Malignant neoplasm of sigmoid colon: Secondary | ICD-10-CM

## 2019-01-16 DIAGNOSIS — M109 Gout, unspecified: Secondary | ICD-10-CM | POA: Diagnosis not present

## 2019-01-16 DIAGNOSIS — N189 Chronic kidney disease, unspecified: Secondary | ICD-10-CM | POA: Insufficient documentation

## 2019-01-16 LAB — URINALYSIS, DIPSTICK ONLY
Bilirubin Urine: NEGATIVE
Glucose, UA: NEGATIVE mg/dL
Hgb urine dipstick: NEGATIVE
Ketones, ur: NEGATIVE mg/dL
Leukocytes,Ua: NEGATIVE
Nitrite: NEGATIVE
Protein, ur: 30 mg/dL — AB
Specific Gravity, Urine: 1.015 (ref 1.005–1.030)
pH: 6 (ref 5.0–8.0)

## 2019-01-16 LAB — CBC WITH DIFFERENTIAL/PLATELET
Abs Immature Granulocytes: 0.02 10*3/uL (ref 0.00–0.07)
Basophils Absolute: 0 10*3/uL (ref 0.0–0.1)
Basophils Relative: 0 %
Eosinophils Absolute: 0.2 10*3/uL (ref 0.0–0.5)
Eosinophils Relative: 3 %
HCT: 35.3 % — ABNORMAL LOW (ref 39.0–52.0)
Hemoglobin: 11 g/dL — ABNORMAL LOW (ref 13.0–17.0)
Immature Granulocytes: 0 %
Lymphocytes Relative: 24 %
Lymphs Abs: 1.9 10*3/uL (ref 0.7–4.0)
MCH: 28.9 pg (ref 26.0–34.0)
MCHC: 31.2 g/dL (ref 30.0–36.0)
MCV: 92.9 fL (ref 80.0–100.0)
Monocytes Absolute: 0.4 10*3/uL (ref 0.1–1.0)
Monocytes Relative: 6 %
Neutro Abs: 5.2 10*3/uL (ref 1.7–7.7)
Neutrophils Relative %: 67 %
Platelets: 193 10*3/uL (ref 150–400)
RBC: 3.8 MIL/uL — ABNORMAL LOW (ref 4.22–5.81)
RDW: 14.5 % (ref 11.5–15.5)
WBC: 7.8 10*3/uL (ref 4.0–10.5)
nRBC: 0 % (ref 0.0–0.2)

## 2019-01-16 LAB — COMPREHENSIVE METABOLIC PANEL
ALT: 15 U/L (ref 0–44)
AST: 16 U/L (ref 15–41)
Albumin: 3.9 g/dL (ref 3.5–5.0)
Alkaline Phosphatase: 65 U/L (ref 38–126)
Anion gap: 11 (ref 5–15)
BUN: 17 mg/dL (ref 8–23)
CO2: 26 mmol/L (ref 22–32)
Calcium: 9.4 mg/dL (ref 8.9–10.3)
Chloride: 104 mmol/L (ref 98–111)
Creatinine, Ser: 1.16 mg/dL (ref 0.61–1.24)
GFR calc Af Amer: >60 mL/min (ref >60)
GFR calc non Af Amer: >60 mL/min (ref >60)
Glucose, Bld: 167 mg/dL — ABNORMAL HIGH (ref 70–99)
Potassium: 3.7 mmol/L (ref 3.5–5.1)
Sodium: 141 mmol/L (ref 135–145)
Total Bilirubin: 0.9 mg/dL (ref 0.3–1.2)
Total Protein: 6.7 g/dL (ref 6.5–8.1)

## 2019-01-16 NOTE — Patient Instructions (Addendum)
[place] at [facility] Discharge Instructions  You were seen today by Dr. Delton Coombes. He went over your recent lab results. He will see you back in 6 weeks for follow up.   Thank you for choosing [place] at [facility] to provide your oncology and hematology care.  To afford each patient quality time with our provider, please arrive at least 15 minutes before your scheduled appointment time.   If you have a lab appointment with the [facility] please come in thru the  Main Entrance and check in at the main information desk  You need to re-schedule your appointment should you arrive 10 or more minutes late.  We strive to give you quality time with our providers, and arriving late affects you and other patients whose appointments are after yours.  Also, if you no show three or more times for appointments you may be dismissed from the clinic at the providers discretion.     Again, thank you for choosing [facility].  Our hope is that these requests will decrease the amount of time that you wait before being seen by our physicians.       _____________________________________________________________  Should you have questions after your visit to [facility], please contact our office at [phone] between the hours of 8:00 a.m. and 4:30 p.m.  Voicemails left after 4:00 p.m. will not be returned until the following business day.  For prescription refill requests, have your pharmacy contact our office and allow 72 hours.    Cancer Center Support Programs:   > Cancer Support Group  2nd Tuesday of the month 1pm-2pm, Journey Room

## 2019-01-16 NOTE — Progress Notes (Signed)
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 Focal narrowing and wall thickening of the sigmoid colon is noted in this area most likely due to inflammation, but neoplasm cannot be excluded, and sigmoidoscopy is recommended for further evaluation.   03/20/2016 Procedure   Colonoscopy by Dr. Laural Golden- Malignant partially obstructing tumor in the distal sigmoid colon. Biopsied. - Incomplete exam.   03/23/2016 Pathology Results   Colon, biopsy, sigmoid mass - ADENOCARCINOMA.   03/23/2016 Procedure   Low anterior resection with transanal anastomosis   03/26/2016 Pathology Results   Colon, segmental resection for tumor, sigmoid - INVASIVE WELL DIFFERENTIATED ADENOCARCINOMA, SPANNING 5 CM IN GREATEST DIMENSION. - TUMOR INVADES THROUGH MUSCULARIS PROPRIA TO INVOLVE SUBSEROSAL SOFT TISSUES. - UNDERLYING MARKED ACUTE AND CHRONIC INFLAMMATION WITH GIANT CELL REACTION AND ABSCESS FORMATION EXTENDING TO THE SEROSAL SURFACE AND INVOLVING THE MESENTERIC TISSUE. - MARGINS ARE NEGATIVE FOR TUMOR. - FOURTEEN BENIGN LYMPH NODES WHICH DEMONSTRATE VARIABLE INVOLVEMENT WITH ACUTE AND CHRONIC INFLAMMATION WITH ASSOCIATED GIANT CELLS WITHOUT TUMOR IDENTIFIED (0/14).   04/08/2016 Cancer Staging   Cancer Staging Adenocarcinoma of sigmoid colon [facility]) Staging form: Colon and Rectum, AJCC 8th Edition - Pathologic stage from 04/01/2016: Stage IIA (pT3, pN0, cM0) - Signed by Baird Cancer, PA-C on 04/01/2016    07/09/2016 Imaging   CT abd/pelvis- No evidence of metastatic  disease or other acute findings.  Bilateral nonobstructing renal calculi and mild bilateral renal parenchymal scarring.  Stable mildly enlarged prostate gland and findings of chronic bladder outlet obstruction.   08/16/2018 -  Chemotherapy   The patient had palonosetron (ALOXI) injection 0.25 mg, 0.25 mg, Intravenous,  Once, 6 of 6 cycles Administration: 0.25 mg (08/16/2018), 0.25 mg (09/07/2018), 0.25 mg (10/19/2018), 0.25 mg (11/02/2018), 0.25 mg (11/16/2018), 0.25 mg (11/30/2018) pegfilgrastim (NEULASTA) injection 6 mg, 6 mg, Subcutaneous, Once, 6 of 6 cycles Administration: 6 mg (08/18/2018), 6 mg (09/09/2018), 6 mg (10/21/2018), 6 mg (11/04/2018), 6 mg (11/18/2018) irinotecan (CAMPTOSAR) 380 mg in sodium chloride 0.9 % 500 mL chemo infusion, 165 mg/m2 = 380 mg, Intravenous,  Once, 1 of 1 cycle Administration: 380 mg (08/16/2018) leucovorin 900 mg in dextrose 5 % 250 mL infusion, 400 mg/m2 = 900 mg, Intravenous,  Once, 6 of 6 cycles Administration: 900 mg (08/16/2018), 900 mg (09/07/2018), 900 mg (10/19/2018), 900 mg (11/02/2018), 900 mg (11/16/2018), 800 mg (11/30/2018) oxaliplatin (ELOXATIN) 190 mg in dextrose 5 % 500 mL chemo infusion, 85 mg/m2 = 190 mg, Intravenous,  Once, 6 of 6 cycles Dose modification: 68 mg/m2 (80 % of original dose 85 mg/m2, Cycle 2, Reason: Provider Judgment), 68 mg/m2 (80 % of original dose 85 mg/m2, Cycle 3, Reason: Provider Judgment) Administration: 190 mg (08/16/2018), 150 mg (09/07/2018), 150 mg (10/19/2018), 150 mg (11/16/2018), 150 mg (11/02/2018), 135 mg (11/30/2018) fluorouracil (ADRUCIL) chemo injection 700 mg, 320 mg/m2 = 700 mg (100 % of original dose 320 mg/m2), Intravenous,  Once, 3 of 3 cycles Dose modification: 320 mg/m2 (original dose 320 mg/m2, Cycle 4) Administration: 700 mg (11/02/2018), 700 mg (11/16/2018), 650 mg (11/30/2018) fosaprepitant (EMEND) 150 mg, dexamethasone (DECADRON) 12 mg in sodium  chloride 0.9 % 145 mL IVPB, , Intravenous,  Once, 6 of 6  cycles Administration:  (08/16/2018),  (09/07/2018),  (10/19/2018),  (11/02/2018),  (11/16/2018),  (11/30/2018) fluorouracil (ADRUCIL) 5,400 mg in sodium chloride 0.9 % 142 mL chemo infusion, 2,400 mg/m2 = 5,400 mg, Intravenous, 1 Day/Dose, 6 of 6 cycles Dose modification: 1,920 mg/m2 (80 % of original dose 2,400 mg/m2, Cycle 3, Reason: Provider Judgment) Administration: 5,400 mg (08/16/2018), 5,400 mg (09/07/2018), 4,300 mg (10/19/2018), 4,300 mg (11/02/2018), 4,300 mg (11/16/2018), 3,850 mg (11/30/2018) bevacizumab-awwb (MVASI) 500 mg in sodium chloride 0.9 % 100 mL chemo infusion, 5 mg/kg = 500 mg (100 % of original dose 5 mg/kg), Intravenous,  Once, 6 of 6 cycles Dose modification: 5 mg/kg (original dose 5 mg/kg, Cycle 1), 5 mg/kg (original dose 5 mg/kg, Cycle 2) Administration: 500 mg (08/16/2018), 500 mg (09/07/2018), 500 mg (10/19/2018), 500 mg (11/02/2018), 450 mg (11/16/2018), 400 mg (11/30/2018)  for chemotherapy treatment.       CANCER STAGING: Cancer Staging Adenocarcinoma of sigmoid colon [facility]) Staging form: Colon and Rectum, AJCC 8th Edition - Pathologic stage from 04/01/2016: Stage IIA (pT3, pN0, cM0) - Signed by Baird Cancer, PA-C on 04/01/2016    INTERVAL HISTORY:  Mr. [place] 63 y.o. male seen for follow-up of metastatic colon cancer.  He met with Dr. Crisoforo Oxford at [facility].  He had blood work done.  He also had endoscopic ultrasound and biopsy on 12/30/2018 by Dr. Jerene Pitch at [facility].  He is feeling fine with regards to energy levels.  Appetite is 100%.  Energy is 75%.  No pain reported.  He has on and off numbness in the feet.  He also has feet swelling.  He is continuing Aricept 10 mg daily.  REVIEW OF SYSTEMS:  Review of Systems  Cardiovascular: Positive for leg swelling.  All other systems reviewed and are negative.    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[facility]) 03/23/2016   Carpal tunnel  syndrome 05/02/2014   Bilateral   Cervical disc disorder with radiculopathy of cervical region 05/02/2014   Left C8   Diabetes mellitus without complication ([facility])    Hypertension    Port-A-Cath in place 08/10/2018   Past Surgical History:  Procedure Laterality Date   BIOPSY  03/20/2016   Procedure: BIOPSY;  Surgeon: Rogene Houston, MD;  Location: [facility];  Service: Endoscopy;;  colon   BOWEL RESECTION N/A 03/23/2016   Procedure: LOW ANTERIOR RESECTION;  Surgeon: Aviva Signs, MD;  Location: [facility] ORS;  Service: General;  Laterality: N/A;   COLONOSCOPY N/A 03/20/2016   Procedure: COLONOSCOPY;  Surgeon: Rogene Houston, MD;  Location: [facility] ENDO SUITE;  Service: Endoscopy;  Laterality: N/A;  730   COLONOSCOPY N/A 07/10/2016   Procedure: COLONOSCOPY;  Surgeon: Rogene Houston, MD;  Location: [facility] END[facility];  Service: Endoscopy;  Laterality: N/A;  1040   NO PAST SURGERIES     POLYPECTOMY  07/10/2016   Procedure: POLYPECTOMY;  Surgeon: Rogene Houston, MD;  Location: [facility];  Service: Endoscopy;;  colon   PORTACATH PLACEMENT Left 08/05/2018   Procedure: INSERTION PORT-A-CATH;  Surgeon: Aviva Signs, MD;  Location: [facility] ORS;  Service: General;  Laterality: Left;   TONSILLECTOMY       SOCIAL HISTORY:  Social History   Socioeconomic History   Marital status: Married    Spouse name: Not on file   Number of children: Not on file  Years of education: Not on file   Highest education level: Not on file  Occupational History   Not on file  Social Needs   Financial resource strain: Not on file   Food insecurity    Worry: Not on file    Inability: Not on file   Transportation needs    Medical: Not on file    Non-medical: Not on file  Tobacco Use   Smoking status: Never Smoker   Smokeless tobacco: Never Used  Substance and Sexual Activity   Alcohol use: Yes    Comment: rarely   Drug use: No   Sexual activity: Not on file  Lifestyle   Physical activity     Days per week: Not on file    Minutes per session: Not on file   Stress: Not on file  Relationships   Social connections    Talks on phone: Not on file    Gets together: Not on file    Attends religious service: Not on file    Active member of club or organization: Not on file    Attends meetings of clubs or organizations: Not on file    Relationship status: Not on file   Intimate partner violence    Fear of current or ex partner: Not on file    Emotionally abused: Not on file    Physically abused: Not on file    Forced sexual activity: Not on file  Other Topics Concern   Not on file  Social History Narrative   Not on file    FAMILY HISTORY:  Family History  Problem Relation Age of Onset   COPD Mother    Melanoma Father     CURRENT MEDICATIONS:  Outpatient Encounter Medications as of 01/16/2019  Medication Sig   allopurinol (ZYLOPRIM) 300 MG tablet Take 1 tablet (300 mg total) by mouth daily.   amLODipine (NORVASC) 10 MG tablet Take 10 mg by mouth daily.   donepezil (ARICEPT) 10 MG tablet Take 1 tablet (10 mg total) by mouth at bedtime.   insulin glargine (LANTUS) 100 UNIT/ML injection Inject 30 Units into the skin at bedtime.    metoprolol succinate (TOPROL-XL) 50 MG 24 hr tablet Take 50 mg by mouth daily. Take with or immediately following a meal.   potassium chloride SA (K-DUR) 20 MEQ tablet Take 1 tablet (20 mEq total) by mouth daily.   Bevacizumab (AVASTIN IV) Inject into the vein every 14 (fourteen) days.   dextrose 5 % SOLN 1,000 mL with fluorouracil 5 GM/100ML SOLN Inject into the vein over 48 hr.   DIPHENHIST 12.5 MG/5ML liquid SWISH AND SPIT/SWALLOWOONE TEASPOONFUL BY MOUTH 4 TIMES DAILY AS NEEEDE FOR MOUTH PAIN.   diphenoxylate-atropine (LOMOTIL) 2.5-0.025 MG tablet Take 1 tablet by mouth 4 (four) times daily as needed for diarrhea or loose stools. (Patient not taking: Reported on 10/26/2018)   FLUOROURACIL IV Inject into the vein every 14  (fourteen) days.   HYDROcodone-acetaminophen (NORCO) 5-325 MG tablet Take 1 tablet by mouth every 4 (four) hours as needed for moderate pain. (Patient not taking: Reported on 10/26/2018)   IRINOTECAN HCL IV Inject into the vein every 14 (fourteen) days.   LEUCOVORIN CALCIUM IV Inject into the vein every 14 (fourteen) days.   lidocaine (XYLOCAINE) 2 % solution SMARTSIG:1 Teaspoon By Mouth 4 Times Daily PRN   lidocaine-prilocaine (EMLA) cream Apply a small amount to port a cath site and cover with plastic wrap 1 hour prior to chemotherapy appointments (Patient not taking:  Reported on 12/21/2018)   loperamide (IMODIUM A-D) 2 MG tablet Take 2 at onset of diarrhea, then 1 after each watery bowel movement. (Patient not taking: Reported on 10/26/2018)   magic mouthwash w/lidocaine SOLN Take 5 mLs by mouth 4 (four) times daily as needed for mouth pain. (Patient not taking: Reported on 10/26/2018)   MITIGARE 0.6 MG CAPS Take 1 capsule by mouth as needed.    neomycin (MYCIFRADIN) 500 MG tablet Take two tablets at 8:00pm, 9:00pm and 11:00pm the night before surgery.   OXALIPLATIN IV Inject into the vein every 14 (fourteen) days.   prochlorperazine (COMPAZINE) 10 MG tablet Take 1 tablet (10 mg total) by mouth every 6 (six) hours as needed (Nausea or vomiting). (Patient not taking: Reported on 11/02/2018)   QC ANTACID/ANTI-GAS 200-200-20 MG/5ML suspension SMARTSIG:1 Teaspoon By Mouth 4 Times Daily PRN   scopolamine (TRANSDERM-SCOP) 1 MG/3DAYS Place 1 patch (1.5 mg total) onto the skin every 3 (three) days. (Patient not taking: Reported on 10/26/2018)   No facility-administered encounter medications on file as of 01/16/2019.     ALLERGIES:  Allergies  Allergen Reactions   Penicillin G Nausea And Vomiting     PHYSICAL EXAM:  ECOG Performance status: 1  Vital signs: Blood pressure is 125/82.  Pulse rate is 79.  Respirate is 18.  Temperature 98.  Saturations 100.  Physical Exam Cardiovascular:      Rate and Rhythm: Normal rate and regular rhythm.     Heart sounds: Normal heart sounds.  Pulmonary:     Effort: Pulmonary effort is normal.     Breath sounds: Normal breath sounds.  Abdominal:     General: There is no distension.     Palpations: Abdomen is soft. There is no mass.  Musculoskeletal: Normal range of motion.  Skin:    General: Skin is warm and dry.  Neurological:     Mental Status: He is alert and oriented to person, place, and time. Mental status is at baseline.  Psychiatric:        Mood and Affect: Mood normal.        Behavior: Behavior normal.        Thought Content: Thought content normal.        Judgment: Judgment normal.      LABORATORY DATA:  I have reviewed the labs as listed.  CBC    Component Value Date/Time   WBC 7.8 01/16/2019 1343   RBC 3.80 (L) 01/16/2019 1343   HGB 11.0 (L) 01/16/2019 1343   HCT 35.3 (L) 01/16/2019 1343   PLT 193 01/16/2019 1343   MCV 92.9 01/16/2019 1343   MCH 28.9 01/16/2019 1343   MCHC 31.2 01/16/2019 1343   RDW 14.5 01/16/2019 1343   LYMPHSABS 1.9 01/16/2019 1343   MONOABS 0.4 01/16/2019 1343   EOSABS 0.2 01/16/2019 1343   BASOSABS 0.0 01/16/2019 1343   CMP Latest Ref Rng & Units 01/16/2019 12/21/2018 11/30/2018  Glucose 70 - 99 mg/dL 167(H) 144(H) 85  BUN 8 - 23 mg/dL 17 13 28(H)  Creatinine 0.61 - 1.24 mg/dL 1.16 1.06 1.66(H)  Sodium 135 - 145 mmol/L 141 142 136  Potassium 3.5 - 5.1 mmol/L 3.7 4.6 3.8  Chloride 98 - 111 mmol/L 104 109 106  CO2 22 - 32 mmol/L 26 24 20(L)  Calcium 8.9 - 10.3 mg/dL 9.4 9.3 9.3  Total Protein 6.5 - 8.1 g/dL 6.7 5.7(L) 6.3(L)  Total Bilirubin 0.3 - 1.2 mg/dL 0.9 0.7 0.6  Alkaline Phos 38 - 126  U/L 65 79 95  AST 15 - 41 U/L _0 ALT 0 - 44 U/L _1 I have independently reviewed scans and discussed with the patient.  ASSESSMENT & PLAN:   Adenocarcinoma of sigmoid colon ([place]) 1.  Metastatic sigmoid colon cancer to the liver: -Foundation 1 shows MS-stable,  RAS-wild-type. - Cycle 1 of FOLFOXIRI and bevacizumab on 08/16/2018. -Cycle 1 was tolerated very poorly with severe nausea and diarrhea and worsening renal function. 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 -6 cycles of dose reduced FOLFOX with bevacizumab completed on 11/30/2018. -CT scan on 11/29/2018 showed liver mass measuring 2.1 x 1.8 cm, previously 4.5 x 3.3 cm.  Unchanged prominent subcentimeter perirectal and pelvic sidewall lymph nodes measuring up to 8 mm.  No new evidence of metastatic disease in the chest, abdomen or pelvis.  Splenomegaly measuring 16.5 cm. -He underwent rectal EUS on 12/30/2018.  FNA of the perirectal tissues was negative for malignant cells.  However anastomosis biopsy showed invasive adenocarcinoma. -He was evaluated by Dr. Crisoforo Oxford on 01/10/2019.  CEA was 3.2. -He is scheduled for laparoscopic-assisted left hepatectomy and open redo LAR on 02/01/2019. -I will see him back in 6 weeks to review the pathology report and further treatment plan.  2.  Gout: -He is continuing allopurinol.  Does not report any joint pains.  3.  Cognitive dysfunction: -Aricept 5 mg daily started on 10/24/2018, dose increased to 10 mg daily on 11/09/2018. -Wife reported improvement in symptoms but not completely.  We will continue at the same dose at this time.  We will readjust dose after surgery.  4.  CKD: -Creatinine has normalized to 1.1.  Albumin also improved to 3.9.   Total time spent is 25 minutes with more than 50% of the time spent face-to-face discussing treatment plan, counseling and coordination of care.  Orders placed this encounter:  No orders of the defined types were placed in this encounter.    Derek Jack, MD [place] [phone]

## 2019-01-16 NOTE — Assessment & Plan Note (Addendum)
1.  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 - Cycle 1 of FOLFOXIRI and bevacizumab on 08/16/2018. -Cycle 1 was tolerated very poorly with severe nausea and diarrhea and worsening renal function. 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 -6 cycles of dose reduced FOLFOX with bevacizumab completed on 11/30/2018. -CT scan on 11/29/2018 showed liver mass measuring 2.1 x 1.8 cm, previously 4.5 x 3.3 cm.  Unchanged prominent subcentimeter perirectal and pelvic sidewall lymph nodes measuring up to 8 mm.  No new evidence of metastatic disease in the chest, abdomen or pelvis.  Splenomegaly measuring 16.5 cm. -He underwent rectal EUS on 12/30/2018.  FNA of the perirectal tissues was negative for malignant cells.  However anastomosis biopsy showed invasive adenocarcinoma. -He was evaluated by Dr. Crisoforo Oxford on 01/10/2019.  CEA was 3.2. -He is scheduled for laparoscopic-assisted left hepatectomy and open redo LAR on 02/01/2019. -I will see him back in 6 weeks to review the pathology report and further treatment plan.  2.  Gout: -He is continuing allopurinol.  Does not report any joint pains.  3.  Cognitive dysfunction: -Aricept 5 mg daily started on 10/24/2018, dose increased to 10 mg daily on 11/09/2018. -Wife reported improvement in symptoms but not completely.  We will continue at the same dose at this time.  We will readjust dose after surgery.  4.  CKD: -Creatinine has normalized to 1.1.  Albumin also improved to 3.9.

## 2019-02-27 ENCOUNTER — Inpatient Hospital Stay (HOSPITAL_COMMUNITY): Payer: 59

## 2019-02-27 ENCOUNTER — Other Ambulatory Visit: Payer: Self-pay

## 2019-02-27 ENCOUNTER — Inpatient Hospital Stay (HOSPITAL_COMMUNITY): Payer: 59 | Attending: Hematology | Admitting: Hematology

## 2019-02-27 ENCOUNTER — Encounter (HOSPITAL_COMMUNITY): Payer: Self-pay | Admitting: Hematology

## 2019-02-27 VITALS — BP 136/64 | HR 60 | Temp 97.9°F | Resp 18 | Wt 184.4 lb

## 2019-02-27 DIAGNOSIS — C187 Malignant neoplasm of sigmoid colon: Secondary | ICD-10-CM | POA: Diagnosis present

## 2019-02-27 DIAGNOSIS — M109 Gout, unspecified: Secondary | ICD-10-CM | POA: Insufficient documentation

## 2019-02-27 DIAGNOSIS — I129 Hypertensive chronic kidney disease with stage 1 through stage 4 chronic kidney disease, or unspecified chronic kidney disease: Secondary | ICD-10-CM | POA: Diagnosis not present

## 2019-02-27 DIAGNOSIS — E1122 Type 2 diabetes mellitus with diabetic chronic kidney disease: Secondary | ICD-10-CM | POA: Diagnosis not present

## 2019-02-27 DIAGNOSIS — N189 Chronic kidney disease, unspecified: Secondary | ICD-10-CM | POA: Insufficient documentation

## 2019-02-27 DIAGNOSIS — E876 Hypokalemia: Secondary | ICD-10-CM | POA: Insufficient documentation

## 2019-02-27 DIAGNOSIS — C787 Secondary malignant neoplasm of liver and intrahepatic bile duct: Secondary | ICD-10-CM | POA: Insufficient documentation

## 2019-02-27 LAB — COMPREHENSIVE METABOLIC PANEL
ALT: 10 U/L (ref 0–44)
AST: 12 U/L — ABNORMAL LOW (ref 15–41)
Albumin: 3.6 g/dL (ref 3.5–5.0)
Alkaline Phosphatase: 65 U/L (ref 38–126)
Anion gap: 8 (ref 5–15)
BUN: 22 mg/dL (ref 8–23)
CO2: 27 mmol/L (ref 22–32)
Calcium: 10.2 mg/dL (ref 8.9–10.3)
Chloride: 106 mmol/L (ref 98–111)
Creatinine, Ser: 1.39 mg/dL — ABNORMAL HIGH (ref 0.61–1.24)
GFR calc Af Amer: >60 mL/min (ref >60)
GFR calc non Af Amer: 54 mL/min — ABNORMAL LOW (ref >60)
Glucose, Bld: 108 mg/dL — ABNORMAL HIGH (ref 70–99)
Potassium: 4.9 mmol/L (ref 3.5–5.1)
Sodium: 141 mmol/L (ref 135–145)
Total Bilirubin: 0.6 mg/dL (ref 0.3–1.2)
Total Protein: 6.6 g/dL (ref 6.5–8.1)

## 2019-02-27 LAB — CBC WITH DIFFERENTIAL/PLATELET
Abs Immature Granulocytes: 0.01 10*3/uL (ref 0.00–0.07)
Basophils Absolute: 0 10*3/uL (ref 0.0–0.1)
Basophils Relative: 0 %
Eosinophils Absolute: 0.4 10*3/uL (ref 0.0–0.5)
Eosinophils Relative: 8 %
HCT: 30.4 % — ABNORMAL LOW (ref 39.0–52.0)
Hemoglobin: 9.1 g/dL — ABNORMAL LOW (ref 13.0–17.0)
Immature Granulocytes: 0 %
Lymphocytes Relative: 28 %
Lymphs Abs: 1.5 10*3/uL (ref 0.7–4.0)
MCH: 26.2 pg (ref 26.0–34.0)
MCHC: 29.9 g/dL — ABNORMAL LOW (ref 30.0–36.0)
MCV: 87.6 fL (ref 80.0–100.0)
Monocytes Absolute: 0.4 10*3/uL (ref 0.1–1.0)
Monocytes Relative: 7 %
Neutro Abs: 3 10*3/uL (ref 1.7–7.7)
Neutrophils Relative %: 57 %
Platelets: 264 10*3/uL (ref 150–400)
RBC: 3.47 MIL/uL — ABNORMAL LOW (ref 4.22–5.81)
RDW: 13.8 % (ref 11.5–15.5)
WBC: 5.3 10*3/uL (ref 4.0–10.5)
nRBC: 0 % (ref 0.0–0.2)

## 2019-02-27 NOTE — Assessment & Plan Note (Signed)
1.  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 -Cycle 1 of FOLFOX [place] and bevacizumab on 08/16/2018 with poor tolerance with severe nausea and diarrhea and worsening renal function. 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 -5 cycles of dose reduced FOLFOX and bevacizumab completed on 11/30/2018. -Laparoscopic-assisted partial hepatectomy, redo low anterior resection, ureteral stent placement on 02/01/2019. -Patient is recovering well from surgery.  He is still tired. -We reviewed results of the pathology.  Residual grade two 1.9 cm adenocarcinoma at the rectosigmoid anastomosis, circumferential/radial/mesenteric margin involved, partial treatment effect present, yPT3 yPN0, 0/10 lymph nodes positive.  Segment 4B liver mass consistent with metastatic adenocarcinoma, margins negative. -I have recommended 3 more months of pseudoadjuvant chemotherapy.  I will reach out to Dr. Crisoforo Oxford about the positive radial margin to see if he recommends radiation therapy also.  Patient previously had stage II sigmoid colon cancer, status post resection, T3N0 in February 2018.  He did not receive any radiation at that time. -I will reevaluate him in 2 weeks.  2.  Cognitive dysfunction: -Aricept 5 mg started on 10/24/2018, currently taking 10 mg daily.  There is some improvement in symptoms but not completely.  3.  CKD: -Creatinine today is 1.39.  4.  Hypokalemia: -Potassium is 4.9 today.  He is taking potassium daily.  I have told him to discontinue it.  5.  Gout: -He will continue allopurinol.  Does not report any joint pains at this time.

## 2019-02-27 NOTE — Patient Instructions (Signed)
[place] at [facility] Discharge Instructions  You were seen today by Dr. Delton Coombes. He went over how you've been feeling since your surgery. He will discuss your results with Dr. Crisoforo Oxford, because of the positive margin. You may benefit from radiation therapy along with the remainder of your chemotherapy. He will see you back in 2 week for labs and follow up.   Thank you for choosing [place] at [facility] to provide your oncology and hematology care.  To afford each patient quality time with our provider, please arrive at least 15 minutes before your scheduled appointment time.   If you have a lab appointment with the [facility] please come in thru the  Main Entrance and check in at the main information desk  You need to re-schedule your appointment should you arrive 10 or more minutes late.  We strive to give you quality time with our providers, and arriving late affects you and other patients whose appointments are after yours.  Also, if you no show three or more times for appointments you may be dismissed from the clinic at the providers discretion.     Again, thank you for choosing [facility].  Our hope is that these requests will decrease the amount of time that you wait before being seen by our physicians.       _____________________________________________________________  Should you have questions after your visit to [facility], please contact our office at [phone] between the hours of 8:00 a.m. and 4:30 p.m.  Voicemails left after 4:00 p.m. will not be returned until the following business day.  For prescription refill requests, have your pharmacy contact our office and allow 72 hours.    Cancer Center Support Programs:   > Cancer Support Group  2nd Tuesday of the month 1pm-2pm, Journey Room

## 2019-02-27 NOTE — Progress Notes (Signed)
Troy Smith, [place] [id number]   CLINIC:  Medical Oncology/Hematology  PCP:  Troy Smith, Troy Smith [id number] [phone]   REASON FOR VISIT: Follow-up for sigmoid colon Smith mets to the liver  CURRENT THERAPY: FOLFOX on hold.  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 Focal narrowing and wall thickening of the sigmoid colon is noted in this area most likely due to inflammation, but neoplasm cannot be excluded, and sigmoidoscopy is recommended for further evaluation.   03/20/2016 Procedure   Colonoscopy by Troy Smith- Malignant partially obstructing tumor in the distal sigmoid colon. Biopsied. - Incomplete exam.   03/23/2016 Pathology Results   Colon, biopsy, sigmoid mass - ADENOCARCINOMA.   03/23/2016 Procedure   Low anterior resection with transanal anastomosis   03/26/2016 Pathology Results   Colon, segmental resection for tumor, sigmoid - INVASIVE WELL DIFFERENTIATED ADENOCARCINOMA, SPANNING 5 CM IN GREATEST DIMENSION. - TUMOR INVADES THROUGH MUSCULARIS PROPRIA TO INVOLVE SUBSEROSAL SOFT TISSUES. - UNDERLYING MARKED ACUTE AND CHRONIC INFLAMMATION WITH GIANT CELL REACTION AND ABSCESS FORMATION EXTENDING TO THE SEROSAL SURFACE AND INVOLVING THE MESENTERIC TISSUE. - MARGINS ARE NEGATIVE FOR TUMOR. - FOURTEEN BENIGN LYMPH NODES WHICH DEMONSTRATE VARIABLE INVOLVEMENT WITH ACUTE AND CHRONIC INFLAMMATION WITH ASSOCIATED GIANT CELLS WITHOUT TUMOR IDENTIFIED (0/14).   04/08/2016 Smith Staging   Smith Staging Adenocarcinoma of sigmoid colon [facility]) Staging form: Colon and Rectum, AJCC 8th Edition - Pathologic stage from 04/01/2016: Stage IIA (pT3, pN0, cM0) - Signed by Troy Cancer, PA-C on 04/01/2016    07/09/2016 Imaging   CT abd/pelvis- No evidence of metastatic  disease or other acute findings.  Bilateral nonobstructing renal calculi and mild bilateral renal parenchymal scarring.  Stable mildly enlarged prostate gland and findings of chronic bladder outlet obstruction.   08/16/2018 -  Chemotherapy   The patient had palonosetron (ALOXI) injection 0.25 mg, 0.25 mg, Intravenous,  Once, 6 of 6 cycles Administration: 0.25 mg (08/16/2018), 0.25 mg (09/07/2018), 0.25 mg (10/19/2018), 0.25 mg (11/02/2018), 0.25 mg (11/16/2018), 0.25 mg (11/30/2018) pegfilgrastim (NEULASTA) injection 6 mg, 6 mg, Subcutaneous, Once, 6 of 6 cycles Administration: 6 mg (08/18/2018), 6 mg (09/09/2018), 6 mg (10/21/2018), 6 mg (11/04/2018), 6 mg (11/18/2018) irinotecan (CAMPTOSAR) 380 mg in sodium chloride 0.9 % 500 mL chemo infusion, 165 mg/m2 = 380 mg, Intravenous,  Once, 1 of 1 cycle Administration: 380 mg (08/16/2018) leucovorin 900 mg in dextrose 5 % 250 mL infusion, 400 mg/m2 = 900 mg, Intravenous,  Once, 6 of 6 cycles Administration: 900 mg (08/16/2018), 900 mg (09/07/2018), 900 mg (10/19/2018), 900 mg (11/02/2018), 900 mg (11/16/2018), 800 mg (11/30/2018) oxaliplatin (ELOXATIN) 190 mg in dextrose 5 % 500 mL chemo infusion, 85 mg/m2 = 190 mg, Intravenous,  Once, 6 of 6 cycles Dose modification: 68 mg/m2 (80 % of original dose 85 mg/m2, Cycle 2, Reason: Provider Judgment), 68 mg/m2 (80 % of original dose 85 mg/m2, Cycle 3, Reason: Provider Judgment) Administration: 190 mg (08/16/2018), 150 mg (09/07/2018), 150 mg (10/19/2018), 150 mg (11/16/2018), 150 mg (11/02/2018), 135 mg (11/30/2018) fluorouracil (ADRUCIL) chemo injection 700 mg, 320 mg/m2 = 700 mg (100 % of original dose 320 mg/m2), Intravenous,  Once, 3 of 3 cycles Dose modification: 320 mg/m2 (original dose 320 mg/m2, Cycle 4) Administration: 700 mg (11/02/2018), 700 mg (11/16/2018), 650 mg (11/30/2018) fosaprepitant (EMEND) 150 mg, dexamethasone (DECADRON) 12 mg in sodium  chloride 0.9 % 145 mL IVPB, , Intravenous,  Once, 6 of 6  cycles Administration:  (08/16/2018),  (09/07/2018),  (10/19/2018),  (11/02/2018),  (11/16/2018),  (11/30/2018) fluorouracil (ADRUCIL) 5,400 mg in sodium chloride 0.9 % 142 mL chemo infusion, 2,400 mg/m2 = 5,400 mg, Intravenous, 1 Day/Dose, 6 of 6 cycles Dose modification: 1,920 mg/m2 (80 % of original dose 2,400 mg/m2, Cycle 3, Reason: Provider Judgment) Administration: 5,400 mg (08/16/2018), 5,400 mg (09/07/2018), 4,300 mg (10/19/2018), 4,300 mg (11/02/2018), 4,300 mg (11/16/2018), 3,850 mg (11/30/2018) bevacizumab-awwb (MVASI) 500 mg in sodium chloride 0.9 % 100 mL chemo infusion, 5 mg/kg = 500 mg (100 % of original dose 5 mg/kg), Intravenous,  Once, 6 of 6 cycles Dose modification: 5 mg/kg (original dose 5 mg/kg, Cycle 1), 5 mg/kg (original dose 5 mg/kg, Cycle 2) Administration: 500 mg (08/16/2018), 500 mg (09/07/2018), 500 mg (10/19/2018), 500 mg (11/02/2018), 450 mg (11/16/2018), 400 mg (11/30/2018)  for chemotherapy treatment.       Smith STAGING: Smith Staging Adenocarcinoma of sigmoid colon [facility]) Staging form: Colon and Rectum, AJCC 8th Edition - Pathologic stage from 04/01/2016: Stage IIA (pT3, pN0, cM0) - Signed by Troy Cancer, PA-C on 04/01/2016    INTERVAL HISTORY:  Troy Smith 64 y.o. male seen for follow-up of metastatic colon Smith.  He underwent surgery on 02/01/2019.  He is recuperating well.  Still has some fatigue.  Appetite is 50%.  Energy levels are 25%.  On and off diarrhea present.  Denies any tingling or numbness extremities.  Denies any joint pains at this time.  He is hard of hearing as he misplaced his hearing aids.  He is accompanied by his wife today.  REVIEW OF SYSTEMS:  Review of Systems  Gastrointestinal: Positive for diarrhea.  All other systems reviewed and are negative.    PAST MEDICAL/SURGICAL HISTORY:  Past Medical History:  Diagnosis Date  . 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 Carpal tunnel syndrome 05/02/2014   Bilateral  . Cervical disc  disorder with radiculopathy of cervical region 05/02/2014   Left C8  . Diabetes mellitus without complication ([place])   . Hypertension   . Port-A-Cath in place 08/10/2018   Past Surgical History:  Procedure Laterality Date  . BIOPSY  03/20/2016   Procedure: BIOPSY;  Surgeon: Rogene Houston, MD;  Location[facility];  Service: Endoscopy;;  colon  . BOWEL RESECTION N/A 03/23/2016   Procedure: LOW ANTERIOR RESECTION;  Surgeon: Aviva Signs, MD;  Location: [facility];  Service: General;  Laterality: N/A;  . COLONOSCOPY N/A 03/20/2016   Procedure: COLONOSCOPY;  Surgeon: Rogene Houston, MD;  Location: [facility];  Service: Endoscopy;  Laterality: N/A;  730  . COLONOSCOPY N/A 07/10/2016   Procedure: COLONOSCOPY;  Surgeon: Rogene Houston, MD;  Location: [facility];  Service: Endoscopy;  Laterality: N/A;  1040  . NO PAST SURGERIES    . POLYPECTOMY  07/10/2016   Procedure: POLYPECTOMY;  Surgeon: Rogene Houston, MD;  Location: [facility];  Service: Endoscopy;;  colon  . PORTACATH PLACEMENT Left 08/05/2018   Procedure: INSERTION PORT-A-CATH;  Surgeon: Aviva Signs, MD;  Location: [facility];  Service: General;  Laterality: Left;  . TONSILLECTOMY       SOCIAL HISTORY:  Social History   Socioeconomic History  . Marital status: Married    Spouse name: Not on file  . Number of children: Not on file  . Years of education: Not on file  . Highest education level: Not  on file  Occupational History  . Not on file  Tobacco Use  . Smoking status: Never Smoker  . Smokeless tobacco: Never Used  Substance and Sexual Activity  . Alcohol use: Yes    Comment: rarely  . Drug use: No  . Sexual activity: Not on file  Other Topics Concern  . Not on file  Social History Narrative  . Not on file   Social Determinants of Health   Financial Resource Strain:   . Difficulty of Paying Living Expenses: Not on file  Food Insecurity:   . Worried About Charity fundraiser in the Last Year: Not on file  . Ran  Out of Food in the Last Year: Not on file  Transportation Needs:   . Lack of Transportation (Medical): Not on file  . Lack of Transportation (Non-Medical): Not on file  Physical Activity:   . Days of Exercise per Week: Not on file  . Minutes of Exercise per Session: Not on file  Stress:   . Feeling of Stress : Not on file  Social Connections:   . Frequency of Communication with Friends and Family: Not on file  . Frequency of Social Gatherings with Friends and Family: Not on file  . Attends Religious Services: Not on file  . Active Member of Clubs or Organizations: Not on file  . Attends Archivist Meetings: Not on file  . Marital Status: Not on file  Intimate Partner Violence:   . Fear of Current or Ex-Partner: Not on file  . Emotionally Abused: Not on file  . Physically Abused: Not on file  . Sexually Abused: Not on file    FAMILY HISTORY:  Family History  Problem Relation Age of Onset  . COPD Mother   . Melanoma Father     CURRENT MEDICATIONS:  Outpatient Encounter Medications as of 02/27/2019  Medication Sig  . allopurinol (ZYLOPRIM) 300 MG tablet Take 1 tablet (300 mg total) by mouth daily.  Marland Kitchen amLODipine (NORVASC) 10 MG tablet Take 10 mg by mouth daily.  . Bevacizumab (AVASTIN IV) Inject into the vein every 14 (fourteen) days.  Marland Kitchen dextrose 5 % SOLN 1,000 mL with fluorouracil 5 GM/100ML SOLN Inject into the vein over 48 hr.  . donepezil (ARICEPT) 10 MG tablet Take 1 tablet (10 mg total) by mouth at bedtime.  Marland Kitchen FLUOROURACIL IV Inject into the vein every 14 (fourteen) days.  . insulin glargine (LANTUS) 100 UNIT/ML injection Inject 30 Units into the skin at bedtime.   . IRINOTECAN HCL IV Inject into the vein every 14 (fourteen) days.  Marland Kitchen LEUCOVORIN CALCIUM IV Inject into the vein every 14 (fourteen) days.  Marland Kitchen lidocaine (XYLOCAINE) 2 % solution SMARTSIG:1 Teaspoon By Mouth 4 Times Daily PRN  . metoprolol succinate (TOPROL-XL) 50 MG 24 hr tablet Take 50 mg by mouth  daily. Take with or immediately following a meal.  . MITIGARE 0.6 MG CAPS Take 1 capsule by mouth as needed.   . OXALIPLATIN IV Inject into the vein every 14 (fourteen) days.  . potassium chloride SA (K-DUR) 20 MEQ tablet Take 1 tablet (20 mEq total) by mouth daily.  . QC ANTACID/ANTI-GAS 200-200-20 MG/5ML suspension SMARTSIG:1 Teaspoon By Mouth 4 Times Daily PRN  . scopolamine (TRANSDERM-SCOP) 1 MG/3DAYS Place 1 patch (1.5 mg total) onto the skin every 3 (three) days.  . [DISCONTINUED] neomycin (MYCIFRADIN) 500 MG tablet Take two tablets at 8:00pm, 9:00pm and 11:00pm the night before surgery.  Marland Kitchen DIPHENHIST 12.5 MG/5ML liquid  SWISH AND SPIT/SWALLOWOONE TEASPOONFUL BY MOUTH 4 TIMES DAILY AS NEEEDE FOR MOUTH PAIN.  . diphenoxylate-atropine (LOMOTIL) 2.5-0.025 MG tablet Take 1 tablet by mouth 4 (four) times daily as needed for diarrhea or loose stools. (Patient not taking: Reported on 02/27/2019)  . HYDROcodone-acetaminophen (NORCO) 5-325 MG tablet Take 1 tablet by mouth every 4 (four) hours as needed for moderate pain. (Patient not taking: Reported on 10/26/2018)  . lidocaine-prilocaine (EMLA) cream Apply a small amount to port a cath site and cover with plastic wrap 1 hour prior to chemotherapy appointments (Patient not taking: Reported on 12/21/2018)  . loperamide (IMODIUM A-D) 2 MG tablet Take 2 at onset of diarrhea, then 1 after each watery bowel movement. (Patient not taking: Reported on 02/27/2019)  . magic mouthwash w/lidocaine SOLN Take 5 mLs by mouth 4 (four) times daily as needed for mouth pain. (Patient not taking: Reported on 10/26/2018)  . prochlorperazine (COMPAZINE) 10 MG tablet Take 1 tablet (10 mg total) by mouth every 6 (six) hours as needed (Nausea or vomiting). (Patient not taking: Reported on 11/02/2018)   No facility-administered encounter medications on file as of 02/27/2019.    ALLERGIES:  Allergies  Allergen Reactions  . Penicillin G Nausea And Vomiting     PHYSICAL EXAM:   ECOG Performance status: 1  Vital signs: Blood pressure is 125/82.  Pulse rate is 79.  Respirate is 18.  Temperature 98.  Saturations 100.  Physical Exam Cardiovascular:     Rate and Rhythm: Normal rate and regular rhythm.     Heart sounds: Normal heart sounds.  Pulmonary:     Effort: Pulmonary effort is normal.     Breath sounds: Normal breath sounds.  Abdominal:     General: There is no distension.     Palpations: Abdomen is soft. There is no mass.  Musculoskeletal:        General: Normal range of motion.  Skin:    General: Skin is warm and dry.  Neurological:     Mental Status: He is alert and oriented to person, place, and time. Mental status is at baseline.  Psychiatric:        Mood and Affect: Mood normal.        Behavior: Behavior normal.        Thought Content: Thought content normal.        Judgment: Judgment normal.      LABORATORY DATA:  I have reviewed the labs as listed.  CBC    Component Value Date/Time   WBC 5.3 02/27/2019 1440   RBC 3.47 (L) 02/27/2019 1440   HGB 9.1 (L) 02/27/2019 1440   HCT 30.4 (L) 02/27/2019 1440   PLT 264 02/27/2019 1440   MCV 87.6 02/27/2019 1440   MCH 26.2 02/27/2019 1440   MCHC 29.9 (L) 02/27/2019 1440   RDW 13.8 02/27/2019 1440   LYMPHSABS 1.5 02/27/2019 1440   MONOABS 0.4 02/27/2019 1440   EOSABS 0.4 02/27/2019 1440   BASOSABS 0.0 02/27/2019 1440   CMP Latest Ref Rng & Units 02/27/2019 01/16/2019 12/21/2018  Glucose 70 - 99 mg/dL 108(H) 167(H) 144(H)  BUN 8 - 23 mg/dL 22 17 13   Creatinine 0.61 - 1.24 mg/dL 1.39(H) 1.16 1.06  Sodium 135 - 145 mmol/L 141 141 142  Potassium 3.5 - 5.1 mmol/L 4.9 3.7 4.6  Chloride 98 - 111 mmol/L 106 104 109  CO2 22 - 32 mmol/L 27 26 24   Calcium 8.9 - 10.3 mg/dL 10.2 9.4 9.3  Total Protein 6.5 -  8.1 g/dL 6.6 6.7 5.7(L)  Total Bilirubin 0.3 - 1.2 mg/dL 0.6 0.9 0.7  Alkaline Phos 38 - 126 U/L 65 65 79  AST 15 - 41 U/L 12(L) 16 17  ALT 0 - 44 U/L 10 15 15      I have independently  reviewed scans and discussed with the patient.  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  Metastatic sigmoid colon Smith to the liver: -Foundation 1 shows MS-stable, RAS-wild-type. -Cycle 1 of FOLFOX [place] and bevacizumab on 08/16/2018 with poor tolerance with severe nausea and diarrhea and worsening renal function. 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 -5 cycles of dose reduced FOLFOX and bevacizumab completed on 11/30/2018. -Laparoscopic-assisted partial hepatectomy, redo low anterior resection, ureteral stent placement on 02/01/2019. -Patient is recovering well from surgery.  He is still tired. -We reviewed results of the pathology.  Residual grade two 1.9 cm adenocarcinoma at the rectosigmoid anastomosis, circumferential/radial/mesenteric margin involved, partial treatment effect present, yPT3 yPN0, 0/10 lymph nodes positive.  Segment 4B liver mass consistent with metastatic adenocarcinoma, margins negative. -I have recommended 3 more months of pseudoadjuvant chemotherapy.  I will reach out to Dr. Crisoforo Oxford about the positive radial margin to see if he recommends radiation therapy also.  Patient previously had stage II sigmoid colon Smith, status post resection, T3N0 in February 2018.  He did not receive any radiation at that time. -I will reevaluate him in 2 weeks.  2.  Cognitive dysfunction: -Aricept 5 mg started on 10/24/2018, currently taking 10 mg daily.  There is some improvement in symptoms but not completely.  3.  CKD: -Creatinine today is 1.39.  4.  Hypokalemia: -Potassium is 4.9 today.  He is taking potassium daily.  I have told him to discontinue it.  5.  Gout: -He will continue allopurinol.  Does not report any joint pains at this time.   Total time spent is 30 minutes with more than 50% of the time spent face-to-face discussing pathology report, further plan, counseling and  coordination of care.  Orders placed this encounter:  Orders Placed This Encounter  Procedures  . CBC with Differential/Platelet  . Comprehensive metabolic panel  . Iron and TIBC  . Ferritin  . Vitamin B12  . Folate     Derek Jack, MD [place] [phone]

## 2019-03-13 ENCOUNTER — Inpatient Hospital Stay (HOSPITAL_COMMUNITY): Payer: 59 | Attending: Hematology | Admitting: Hematology

## 2019-03-13 ENCOUNTER — Other Ambulatory Visit: Payer: Self-pay

## 2019-03-13 ENCOUNTER — Encounter (HOSPITAL_COMMUNITY): Payer: Self-pay | Admitting: Hematology

## 2019-03-13 ENCOUNTER — Inpatient Hospital Stay (HOSPITAL_COMMUNITY): Payer: 59 | Attending: Hematology

## 2019-03-13 DIAGNOSIS — Z794 Long term (current) use of insulin: Secondary | ICD-10-CM | POA: Diagnosis not present

## 2019-03-13 DIAGNOSIS — C787 Secondary malignant neoplasm of liver and intrahepatic bile duct: Secondary | ICD-10-CM | POA: Insufficient documentation

## 2019-03-13 DIAGNOSIS — Z5189 Encounter for other specified aftercare: Secondary | ICD-10-CM | POA: Diagnosis not present

## 2019-03-13 DIAGNOSIS — C187 Malignant neoplasm of sigmoid colon: Secondary | ICD-10-CM | POA: Insufficient documentation

## 2019-03-13 DIAGNOSIS — Z5111 Encounter for antineoplastic chemotherapy: Secondary | ICD-10-CM | POA: Insufficient documentation

## 2019-03-13 DIAGNOSIS — I129 Hypertensive chronic kidney disease with stage 1 through stage 4 chronic kidney disease, or unspecified chronic kidney disease: Secondary | ICD-10-CM | POA: Insufficient documentation

## 2019-03-13 DIAGNOSIS — Z79899 Other long term (current) drug therapy: Secondary | ICD-10-CM | POA: Insufficient documentation

## 2019-03-13 DIAGNOSIS — N189 Chronic kidney disease, unspecified: Secondary | ICD-10-CM | POA: Diagnosis not present

## 2019-03-13 DIAGNOSIS — E1122 Type 2 diabetes mellitus with diabetic chronic kidney disease: Secondary | ICD-10-CM | POA: Insufficient documentation

## 2019-03-13 DIAGNOSIS — Z95828 Presence of other vascular implants and grafts: Secondary | ICD-10-CM | POA: Diagnosis not present

## 2019-03-13 DIAGNOSIS — E876 Hypokalemia: Secondary | ICD-10-CM | POA: Diagnosis not present

## 2019-03-13 DIAGNOSIS — D509 Iron deficiency anemia, unspecified: Secondary | ICD-10-CM | POA: Insufficient documentation

## 2019-03-13 LAB — IRON AND TIBC
Iron: 42 ug/dL — ABNORMAL LOW (ref 45–182)
Saturation Ratios: 14 % — ABNORMAL LOW (ref 17.9–39.5)
TIBC: 308 ug/dL (ref 250–450)
UIBC: 266 ug/dL

## 2019-03-13 LAB — CBC WITH DIFFERENTIAL/PLATELET
Abs Immature Granulocytes: 0.02 10*3/uL (ref 0.00–0.07)
Basophils Absolute: 0 10*3/uL (ref 0.0–0.1)
Basophils Relative: 1 %
Eosinophils Absolute: 0.3 10*3/uL (ref 0.0–0.5)
Eosinophils Relative: 5 %
HCT: 33.1 % — ABNORMAL LOW (ref 39.0–52.0)
Hemoglobin: 10.1 g/dL — ABNORMAL LOW (ref 13.0–17.0)
Immature Granulocytes: 0 %
Lymphocytes Relative: 29 %
Lymphs Abs: 1.9 10*3/uL (ref 0.7–4.0)
MCH: 26.6 pg (ref 26.0–34.0)
MCHC: 30.5 g/dL (ref 30.0–36.0)
MCV: 87.1 fL (ref 80.0–100.0)
Monocytes Absolute: 0.3 10*3/uL (ref 0.1–1.0)
Monocytes Relative: 5 %
Neutro Abs: 4 10*3/uL (ref 1.7–7.7)
Neutrophils Relative %: 60 %
Platelets: 206 10*3/uL (ref 150–400)
RBC: 3.8 MIL/uL — ABNORMAL LOW (ref 4.22–5.81)
RDW: 15 % (ref 11.5–15.5)
WBC: 6.6 10*3/uL (ref 4.0–10.5)
nRBC: 0 % (ref 0.0–0.2)

## 2019-03-13 LAB — COMPREHENSIVE METABOLIC PANEL
ALT: 12 U/L (ref 0–44)
AST: 14 U/L — ABNORMAL LOW (ref 15–41)
Albumin: 3.5 g/dL (ref 3.5–5.0)
Alkaline Phosphatase: 62 U/L (ref 38–126)
Anion gap: 6 (ref 5–15)
BUN: 21 mg/dL (ref 8–23)
CO2: 27 mmol/L (ref 22–32)
Calcium: 9.5 mg/dL (ref 8.9–10.3)
Chloride: 109 mmol/L (ref 98–111)
Creatinine, Ser: 1.19 mg/dL (ref 0.61–1.24)
GFR calc Af Amer: >60 mL/min (ref >60)
GFR calc non Af Amer: >60 mL/min (ref >60)
Glucose, Bld: 143 mg/dL — ABNORMAL HIGH (ref 70–99)
Potassium: 4.7 mmol/L (ref 3.5–5.1)
Sodium: 142 mmol/L (ref 135–145)
Total Bilirubin: 0.6 mg/dL (ref 0.3–1.2)
Total Protein: 6.1 g/dL — ABNORMAL LOW (ref 6.5–8.1)

## 2019-03-13 LAB — FOLATE: Folate: 16 ng/mL (ref >5.9)

## 2019-03-13 LAB — VITAMIN B12: Vitamin B-12: 373 pg/mL (ref 180–914)

## 2019-03-13 LAB — FERRITIN: Ferritin: 59 ng/mL (ref 24–336)

## 2019-03-13 MED ORDER — PROCHLORPERAZINE MALEATE 10 MG PO TABS: 10.0000 mg | ORAL_TABLET | Freq: Four times a day (QID) | ORAL | 1 refills | Status: DC | PRN

## 2019-03-13 NOTE — Progress Notes (Signed)
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 Focal narrowing and wall thickening of the sigmoid colon is noted in this area most likely due to inflammation, but neoplasm cannot be excluded, and sigmoidoscopy is recommended for further evaluation.   03/20/2016 Procedure   Colonoscopy by Dr. Laural Golden- Malignant partially obstructing tumor in the distal sigmoid colon. Biopsied. - Incomplete exam.   03/23/2016 Pathology Results   Colon, biopsy, sigmoid mass - ADENOCARCINOMA.   03/23/2016 Procedure   Low anterior resection with transanal anastomosis   03/26/2016 Pathology Results   Colon, segmental resection for tumor, sigmoid - INVASIVE WELL DIFFERENTIATED ADENOCARCINOMA, SPANNING 5 CM IN GREATEST DIMENSION. - TUMOR INVADES THROUGH MUSCULARIS PROPRIA TO INVOLVE SUBSEROSAL SOFT TISSUES. - UNDERLYING MARKED ACUTE AND CHRONIC INFLAMMATION WITH GIANT CELL REACTION AND ABSCESS FORMATION EXTENDING TO THE SEROSAL SURFACE AND INVOLVING THE MESENTERIC TISSUE. - MARGINS ARE NEGATIVE FOR TUMOR. - FOURTEEN BENIGN LYMPH NODES WHICH DEMONSTRATE VARIABLE INVOLVEMENT WITH ACUTE AND CHRONIC INFLAMMATION WITH ASSOCIATED GIANT CELLS WITHOUT TUMOR IDENTIFIED (0/14).   04/08/2016 Cancer Staging   Cancer Staging Adenocarcinoma of sigmoid colon [facility]) Staging form: Colon and Rectum, AJCC 8th Edition - Pathologic stage from 04/01/2016: Stage IIA (pT3, pN0, cM0) - Signed by Baird Cancer, PA-C on 04/01/2016    07/09/2016 Imaging   CT abd/pelvis- No evidence of metastatic disease or  other acute findings.  Bilateral nonobstructing renal calculi and mild bilateral renal parenchymal scarring.  Stable mildly enlarged prostate gland and findings of chronic bladder outlet obstruction.   08/16/2018 -  Chemotherapy   The patient had palonosetron (ALOXI) injection 0.25 mg, 0.25 mg, Intravenous,  Once, 6 of 6 cycles Administration: 0.25 mg (08/16/2018), 0.25 mg (09/07/2018), 0.25 mg (10/19/2018), 0.25 mg (11/02/2018), 0.25 mg (11/16/2018), 0.25 mg (11/30/2018) pegfilgrastim (NEULASTA) injection 6 mg, 6 mg, Subcutaneous, Once, 6 of 6 cycles Administration: 6 mg (08/18/2018), 6 mg (09/09/2018), 6 mg (10/21/2018), 6 mg (11/04/2018), 6 mg (11/18/2018) irinotecan (CAMPTOSAR) 380 mg in sodium chloride 0.9 % 500 mL chemo infusion, 165 mg/m2 = 380 mg, Intravenous,  Once, 1 of 1 cycle Administration: 380 mg (08/16/2018) leucovorin 900 mg in dextrose 5 % 250 mL infusion, 400 mg/m2 = 900 mg, Intravenous,  Once, 6 of 6 cycles Administration: 900 mg (08/16/2018), 900 mg (09/07/2018), 900 mg (10/19/2018), 900 mg (11/02/2018), 900 mg (11/16/2018), 800 mg (11/30/2018) oxaliplatin (ELOXATIN) 190 mg in dextrose 5 % 500 mL chemo infusion, 85 mg/m2 = 190 mg, Intravenous,  Once, 6 of 6 cycles Dose modification: 68 mg/m2 (80 % of original dose 85 mg/m2, Cycle 2, Reason: Provider Judgment), 68 mg/m2 (80 % of original dose 85 mg/m2, Cycle 3, Reason: Provider Judgment) Administration: 190 mg (08/16/2018), 150 mg (09/07/2018), 150 mg (10/19/2018), 150 mg (11/16/2018), 150 mg (11/02/2018), 135 mg (11/30/2018) fluorouracil (ADRUCIL) chemo injection 700 mg, 320 mg/m2 = 700 mg (100 % of original dose 320 mg/m2), Intravenous,  Once, 3 of 3 cycles Dose modification: 320 mg/m2 (original dose 320 mg/m2, Cycle 4) Administration: 700 mg (11/02/2018), 700 mg (11/16/2018), 650 mg (11/30/2018) fosaprepitant (EMEND) 150 mg, dexamethasone (DECADRON) 12 mg in sodium chloride 0.9 %  145 mL IVPB, , Intravenous,  Once, 6 of 6 cycles Administration:   (08/16/2018),  (09/07/2018),  (10/19/2018),  (11/02/2018),  (11/16/2018),  (11/30/2018) fluorouracil (ADRUCIL) 5,400 mg in sodium chloride 0.9 % 142 mL chemo infusion, 2,400 mg/m2 = 5,400 mg, Intravenous, 1 Day/Dose, 6 of 6 cycles Dose modification: 1,920 mg/m2 (80 % of original dose 2,400 mg/m2, Cycle 3, Reason: Provider Judgment) Administration: 5,400 mg (08/16/2018), 5,400 mg (09/07/2018), 4,300 mg (10/19/2018), 4,300 mg (11/02/2018), 4,300 mg (11/16/2018), 3,850 mg (11/30/2018) bevacizumab-awwb (MVASI) 500 mg in sodium chloride 0.9 % 100 mL chemo infusion, 5 mg/kg = 500 mg (100 % of original dose 5 mg/kg), Intravenous,  Once, 6 of 6 cycles Dose modification: 5 mg/kg (original dose 5 mg/kg, Cycle 1), 5 mg/kg (original dose 5 mg/kg, Cycle 2) Administration: 500 mg (08/16/2018), 500 mg (09/07/2018), 500 mg (10/19/2018), 500 mg (11/02/2018), 450 mg (11/16/2018), 400 mg (11/30/2018)  for chemotherapy treatment.       CANCER STAGING: Cancer Staging Adenocarcinoma of sigmoid colon [facility]) Staging form: Colon and Rectum, AJCC 8th Edition - Pathologic stage from 04/01/2016: Stage IIA (pT3, pN0, cM0) - Signed by Baird Cancer, PA-C on 04/01/2016    INTERVAL HISTORY:  Mr. Eunice 64 y.o. male seen for follow-up of his colon cancer.  He reports occasional diarrhea since surgery.  Appetite is 75%.  Energy levels are 50%.  Denies any tingling or numbness in the extremities.  Denies any joint pains at this time.  Is able to eat well and gaining weight.  REVIEW OF SYSTEMS:  Review of Systems  Gastrointestinal: Positive for diarrhea.  All other systems reviewed and are negative.    PAST MEDICAL/SURGICAL HISTORY:  Past Medical History:  Diagnosis Date  . 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) 03/23/2016  . Carpal tunnel syndrome 05/02/2014   Bilateral  . Cervical disc disorder with radiculopathy of cervical region 05/02/2014   Left C8  . Diabetes mellitus without complication ([place])   . Hypertension   . Port-A-Cath  in place 08/10/2018   Past Surgical History:  Procedure Laterality Date  . BIOPSY  03/20/2016   Procedure: BIOPSY;  Surgeon: Rogene Houston, MD;  [facility];  Service: Endoscopy;;  colon  . BOWEL RESECTION N/A 03/23/2016   Procedure: LOW ANTERIOR RESECTION;  Surgeon: Aviva Signs, MD;  Location: [facility] ORS;  Service: General;  Laterality: N/A;  . COLONOSCOPY N/A 03/20/2016   Procedure: COLONOSCOPY;  Surgeon: Rogene Houston, MD;  Location: [facility];  Service: Endoscopy;  Laterality: N/A;  730  . COLONOSCOPY N/A 07/10/2016   Procedure: COLONOSCOPY;  Surgeon: Rogene Houston, MD;  Location: [facility];  Service: Endoscopy;  Laterality: N/A;  1040  . NO PAST SURGERIES    . POLYPECTOMY  07/10/2016   Procedure: POLYPECTOMY;  Surgeon: Rogene Houston, MD;  Location: [facility];  Service: Endoscopy;;  colon  . PORTACATH PLACEMENT Left 08/05/2018   Procedure: INSERTION PORT-A-CATH;  Surgeon: Aviva Signs, MD;  Location: [facility] ORS;  Service: General;  Laterality: Left;  . TONSILLECTOMY       SOCIAL HISTORY:  Social History   Socioeconomic History  . Marital status: Married    Spouse name: Not on file  . Number of children: Not on file  . Years of education: Not on file  . Highest education level: Not on file  Occupational History  . Not on file  Tobacco Use  . Smoking status: Never Smoker  . Smokeless tobacco: Never Used  Substance  and Sexual Activity  . Alcohol use: Yes    Comment: rarely  . Drug use: No  . Sexual activity: Not on file  Other Topics Concern  . Not on file  Social History Narrative  . Not on file   Social Determinants of Health   Financial Resource Strain:   . Difficulty of Paying Living Expenses: Not on file  Food Insecurity:   . Worried About Charity fundraiser in the Last Year: Not on file  . Ran Out of Food in the Last Year: Not on file  Transportation Needs:   . Lack of Transportation (Medical): Not on file  . Lack of Transportation  (Non-Medical): Not on file  Physical Activity:   . Days of Exercise per Week: Not on file  . Minutes of Exercise per Session: Not on file  Stress:   . Feeling of Stress : Not on file  Social Connections:   . Frequency of Communication with Friends and Family: Not on file  . Frequency of Social Gatherings with Friends and Family: Not on file  . Attends Religious Services: Not on file  . Active Member of Clubs or Organizations: Not on file  . Attends Archivist Meetings: Not on file  . Marital Status: Not on file  Intimate Partner Violence:   . Fear of Current or Ex-Partner: Not on file  . Emotionally Abused: Not on file  . Physically Abused: Not on file  . Sexually Abused: Not on file    FAMILY HISTORY:  Family History  Problem Relation Age of Onset  . COPD Mother   . Melanoma Father     CURRENT MEDICATIONS:  Outpatient Encounter Medications as of 03/13/2019  Medication Sig  . allopurinol (ZYLOPRIM) 300 MG tablet Take 1 tablet (300 mg total) by mouth daily.  Marland Kitchen amLODipine (NORVASC) 10 MG tablet Take 10 mg by mouth daily.  Marland Kitchen donepezil (ARICEPT) 10 MG tablet Take 1 tablet (10 mg total) by mouth at bedtime.  . insulin glargine (LANTUS) 100 UNIT/ML injection Inject 30 Units into the skin at bedtime.   . metoprolol succinate (TOPROL-XL) 50 MG 24 hr tablet Take 50 mg by mouth daily. Take with or immediately following a meal.  . MITIGARE 0.6 MG CAPS Take 1 capsule by mouth as needed.   . potassium chloride SA (K-DUR) 20 MEQ tablet Take 1 tablet (20 mEq total) by mouth daily.  . Bevacizumab (AVASTIN IV) Inject into the vein every 14 (fourteen) days.  Marland Kitchen dextrose 5 % SOLN 1,000 mL with fluorouracil 5 GM/100ML SOLN Inject into the vein over 48 hr.  . DIPHENHIST 12.5 MG/5ML liquid SWISH AND SPIT/SWALLOWOONE TEASPOONFUL BY MOUTH 4 TIMES DAILY AS NEEEDE FOR MOUTH PAIN.  . diphenoxylate-atropine (LOMOTIL) 2.5-0.025 MG tablet Take 1 tablet by mouth 4 (four) times daily as needed for  diarrhea or loose stools. (Patient not taking: Reported on 02/27/2019)  . FLUOROURACIL IV Inject into the vein every 14 (fourteen) days.  Marland Kitchen HYDROcodone-acetaminophen (NORCO) 5-325 MG tablet Take 1 tablet by mouth every 4 (four) hours as needed for moderate pain. (Patient not taking: Reported on 10/26/2018)  . IRINOTECAN HCL IV Inject into the vein every 14 (fourteen) days.  Marland Kitchen LEUCOVORIN CALCIUM IV Inject into the vein every 14 (fourteen) days.  Marland Kitchen lidocaine (XYLOCAINE) 2 % solution SMARTSIG:1 Teaspoon By Mouth 4 Times Daily PRN  . lidocaine-prilocaine (EMLA) cream Apply a small amount to port a cath site and cover with plastic wrap 1 hour prior to  chemotherapy appointments (Patient not taking: Reported on 12/21/2018)  . loperamide (IMODIUM A-D) 2 MG tablet Take 2 at onset of diarrhea, then 1 after each watery bowel movement. (Patient not taking: Reported on 02/27/2019)  . magic mouthwash w/lidocaine SOLN Take 5 mLs by mouth 4 (four) times daily as needed for mouth pain. (Patient not taking: Reported on 10/26/2018)  . OXALIPLATIN IV Inject into the vein every 14 (fourteen) days.  . prochlorperazine (COMPAZINE) 10 MG tablet Take 1 tablet (10 mg total) by mouth every 6 (six) hours as needed (Nausea or vomiting).  . QC ANTACID/ANTI-GAS 200-200-20 MG/5ML suspension SMARTSIG:1 Teaspoon By Mouth 4 Times Daily PRN  . scopolamine (TRANSDERM-SCOP) 1 MG/3DAYS Place 1 patch (1.5 mg total) onto the skin every 3 (three) days. (Patient not taking: Reported on 03/13/2019)  . [DISCONTINUED] prochlorperazine (COMPAZINE) 10 MG tablet Take 1 tablet (10 mg total) by mouth every 6 (six) hours as needed (Nausea or vomiting). (Patient not taking: Reported on 11/02/2018)   No facility-administered encounter medications on file as of 03/13/2019.    ALLERGIES:  Allergies  Allergen Reactions  . Penicillin G Nausea And Vomiting     PHYSICAL EXAM:  ECOG Performance status: 1  Vital signs: Blood pressure is 125/82.  Pulse rate  is 79.  Respirate is 18.  Temperature 98.  Saturations 100.  Physical Exam Cardiovascular:     Rate and Rhythm: Normal rate and regular rhythm.     Heart sounds: Normal heart sounds.  Pulmonary:     Effort: Pulmonary effort is normal.     Breath sounds: Normal breath sounds.  Abdominal:     General: There is no distension.     Palpations: Abdomen is soft. There is no mass.  Musculoskeletal:        General: Normal range of motion.  Skin:    General: Skin is warm and dry.  Neurological:     Mental Status: He is alert and oriented to person, place, and time. Mental status is at baseline.  Psychiatric:        Mood and Affect: Mood normal.        Behavior: Behavior normal.        Thought Content: Thought content normal.        Judgment: Judgment normal.      LABORATORY DATA:  I have reviewed the labs as listed.  CBC    Component Value Date/Time   WBC 6.6 03/13/2019 1405   RBC 3.80 (L) 03/13/2019 1405   HGB 10.1 (L) 03/13/2019 1405   HCT 33.1 (L) 03/13/2019 1405   PLT 206 03/13/2019 1405   MCV 87.1 03/13/2019 1405   MCH 26.6 03/13/2019 1405   MCHC 30.5 03/13/2019 1405   RDW 15.0 03/13/2019 1405   LYMPHSABS 1.9 03/13/2019 1405   MONOABS 0.3 03/13/2019 1405   EOSABS 0.3 03/13/2019 1405   BASOSABS 0.0 03/13/2019 1405   CMP Latest Ref Rng & Units 03/13/2019 02/27/2019 01/16/2019  Glucose 70 - 99 mg/dL 143(H) 108(H) 167(H)  BUN 8 - 23 mg/dL 21 22 17   Creatinine 0.61 - 1.24 mg/dL 1.19 1.39(H) 1.16  Sodium 135 - 145 mmol/L 142 141 141  Potassium 3.5 - 5.1 mmol/L 4.7 4.9 3.7  Chloride 98 - 111 mmol/L 109 106 104  CO2 22 - 32 mmol/L 27 27 26   Calcium 8.9 - 10.3 mg/dL 9.5 10.2 9.4  Total Protein 6.5 - 8.1 g/dL 6.1(L) 6.6 6.7  Total Bilirubin 0.3 - 1.2 mg/dL 0.6 0.6 0.9  Alkaline  Phos 38 - 126 U/L 62 65 65  AST 15 - 41 U/L 14(L) 12(L) 16  ALT 0 - 44 U/L 12 10 15      I have independently reviewed scans and discussed with the patient.  ASSESSMENT & PLAN:   Adenocarcinoma of  sigmoid colon ([place]) 1.  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 -Cycle 1 of FOLFOX [place] and bevacizumab on 08/16/2018 with poor tolerance with severe nausea and diarrhea and worsening renal function. 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 -5 cycles of dose reduced FOLFOX and bevacizumab completed on 11/30/2018. -Laparoscopic-assisted partial hepatectomy, redo low anterior resection, ureteral stent placement on 02/01/2019. -Pathology showed residual grade two 1.9 cm adenocarcinoma at the rectosigmoid anastomosis, circumferential/radial/mesenteric margin involved, partial treatment effect present, yPT3 yPN0, 0/10 lymph nodes positive.  Segment 4B liver mass consistent with metastatic adenocarcinoma, margins negative. -I have recommended 3 more months of FOLFOX based chemotherapy.  We will tentatively start him next week.  We have also communicated the treatment plan with Dr. Crisoforo Oxford. -We talked about side effects again.  I will discontinue Avastin. -Upon finishing FOLFOX, he will be referred for chemoradiation with 5-FU for his positive radial margin. -He has intermittent diarrhea since surgery.  He was told to take Imodium.  We reviewed his labs.  His hemoglobin is 10.1 and ferritin is 59.  2.  Cognitive dysfunction: -Continue Aricept 10 mg daily.  3.  CKD: -Creatinine improved to 1.19.  4.  Hypokalemia: -Potassium today is 4.7.  I have discontinued his potassium at last visit on 02/27/2019.  5.  Gout: -He will continue allopurinol.  Does not report any joint pains at this time.   Total time spent is 30 minutes with more than 50% of the time spent face-to-face discussing treatment plan, counseling and coordination of care.  Orders placed this encounter:  No orders of the defined types were placed in this encounter.    Derek Jack, MD [place] [phone]

## 2019-03-13 NOTE — Patient Instructions (Signed)
[place] at [facility] Discharge Instructions  You were seen today by Dr. Delton Coombes. He went over your recent lab results. He discussed starting you back on FOLFOX times 6 cycles, with no Avastin. He will refer you later on to a radiation oncologist for evaluation and radiation therapy. He will see you back soon for labs,treatment and follow up.   Thank you for choosing [place] at [facility] to provide your oncology and hematology care.  To afford each patient quality time with our provider, please arrive at least 15 minutes before your scheduled appointment time.   If you have a lab appointment with the [facility] please come in thru the  Main Entrance and check in at the main information desk  You need to re-schedule your appointment should you arrive 10 or more minutes late.  We strive to give you quality time with our providers, and arriving late affects you and other patients whose appointments are after yours.  Also, if you no show three or more times for appointments you may be dismissed from the clinic at the providers discretion.     Again, thank you for choosing [facility].  Our hope is that these requests will decrease the amount of time that you wait before being seen by our physicians.       _____________________________________________________________  Should you have questions after your visit to [facility], please contact our office at [phone] between the hours of 8:00 a.m. and 4:30 p.m.  Voicemails left after 4:00 p.m. will not be returned until the following business day.  For prescription refill requests, have your pharmacy contact our office and allow 72 hours.    Cancer Center Support Programs:   > Cancer Support Group  2nd Tuesday of the month 1pm-2pm, Journey Room

## 2019-03-13 NOTE — Assessment & Plan Note (Addendum)
1.  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 -Cycle 1 of FOLFOX [place] and bevacizumab on 08/16/2018 with poor tolerance with severe nausea and diarrhea and worsening renal function. 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 -5 cycles of dose reduced FOLFOX and bevacizumab completed on 11/30/2018. -Laparoscopic-assisted partial hepatectomy, redo low anterior resection, ureteral stent placement on 02/01/2019. -Pathology showed residual grade two 1.9 cm adenocarcinoma at the rectosigmoid anastomosis, circumferential/radial/mesenteric margin involved, partial treatment effect present, yPT3 yPN0, 0/10 lymph nodes positive.  Segment 4B liver mass consistent with metastatic adenocarcinoma, margins negative. -I have recommended 3 more months of FOLFOX based chemotherapy.  We will tentatively start him next week.  We have also communicated the treatment plan with Dr. Crisoforo Oxford. -We talked about side effects again.  I will discontinue Avastin. -Upon finishing FOLFOX, he will be referred for chemoradiation with 5-FU for his positive radial margin. -He has intermittent diarrhea since surgery.  He was told to take Imodium.  We reviewed his labs.  His hemoglobin is 10.1 and ferritin is 59.  2.  Cognitive dysfunction: -Continue Aricept 10 mg daily.  3.  CKD: -Creatinine improved to 1.19.  4.  Hypokalemia: -Potassium today is 4.7.  I have discontinued his potassium at last visit on 02/27/2019.  5.  Gout: -He will continue allopurinol.  Does not report any joint pains at this time.

## 2019-03-16 ENCOUNTER — Other Ambulatory Visit (HOSPITAL_COMMUNITY): Payer: Self-pay | Admitting: Surgery

## 2019-03-16 DIAGNOSIS — M25661 Stiffness of right knee, not elsewhere classified: Secondary | ICD-10-CM

## 2019-03-16 DIAGNOSIS — M7989 Other specified soft tissue disorders: Secondary | ICD-10-CM

## 2019-03-16 MED ORDER — ALLOPURINOL 300 MG PO TABS: 300.0000 mg | ORAL_TABLET | Freq: Every day | ORAL | 3 refills | Status: DC

## 2019-03-21 ENCOUNTER — Other Ambulatory Visit: Payer: Self-pay

## 2019-03-21 ENCOUNTER — Encounter (HOSPITAL_COMMUNITY): Payer: Self-pay

## 2019-03-21 ENCOUNTER — Encounter (HOSPITAL_COMMUNITY): Payer: Self-pay | Admitting: Hematology

## 2019-03-21 ENCOUNTER — Inpatient Hospital Stay (HOSPITAL_COMMUNITY): Payer: 59

## 2019-03-21 ENCOUNTER — Inpatient Hospital Stay (HOSPITAL_BASED_OUTPATIENT_CLINIC_OR_DEPARTMENT_OTHER): Payer: 59 | Admitting: Hematology

## 2019-03-21 VITALS — BP 133/78 | HR 67 | Temp 97.6°F | Resp 18 | Wt 195.6 lb

## 2019-03-21 DIAGNOSIS — D500 Iron deficiency anemia secondary to blood loss (chronic): Secondary | ICD-10-CM

## 2019-03-21 DIAGNOSIS — C187 Malignant neoplasm of sigmoid colon: Secondary | ICD-10-CM

## 2019-03-21 DIAGNOSIS — Z5111 Encounter for antineoplastic chemotherapy: Secondary | ICD-10-CM | POA: Diagnosis not present

## 2019-03-21 DIAGNOSIS — Z95828 Presence of other vascular implants and grafts: Secondary | ICD-10-CM

## 2019-03-21 LAB — COMPREHENSIVE METABOLIC PANEL
ALT: 13 U/L (ref 0–44)
AST: 14 U/L — ABNORMAL LOW (ref 15–41)
Albumin: 3.6 g/dL (ref 3.5–5.0)
Alkaline Phosphatase: 61 U/L (ref 38–126)
Anion gap: 7 (ref 5–15)
BUN: 20 mg/dL (ref 8–23)
CO2: 27 mmol/L (ref 22–32)
Calcium: 9 mg/dL (ref 8.9–10.3)
Chloride: 108 mmol/L (ref 98–111)
Creatinine, Ser: 1.13 mg/dL (ref 0.61–1.24)
GFR calc Af Amer: >60 mL/min (ref >60)
GFR calc non Af Amer: >60 mL/min (ref >60)
Glucose, Bld: 95 mg/dL (ref 70–99)
Potassium: 3.6 mmol/L (ref 3.5–5.1)
Sodium: 142 mmol/L (ref 135–145)
Total Bilirubin: 0.8 mg/dL (ref 0.3–1.2)
Total Protein: 6.2 g/dL — ABNORMAL LOW (ref 6.5–8.1)

## 2019-03-21 LAB — CBC WITH DIFFERENTIAL/PLATELET
Abs Immature Granulocytes: 0.01 10*3/uL (ref 0.00–0.07)
Basophils Absolute: 0 10*3/uL (ref 0.0–0.1)
Basophils Relative: 0 %
Eosinophils Absolute: 0.3 10*3/uL (ref 0.0–0.5)
Eosinophils Relative: 5 %
HCT: 31 % — ABNORMAL LOW (ref 39.0–52.0)
Hemoglobin: 9.5 g/dL — ABNORMAL LOW (ref 13.0–17.0)
Immature Granulocytes: 0 %
Lymphocytes Relative: 26 %
Lymphs Abs: 1.6 10*3/uL (ref 0.7–4.0)
MCH: 26.4 pg (ref 26.0–34.0)
MCHC: 30.6 g/dL (ref 30.0–36.0)
MCV: 86.1 fL (ref 80.0–100.0)
Monocytes Absolute: 0.3 10*3/uL (ref 0.1–1.0)
Monocytes Relative: 5 %
Neutro Abs: 3.9 10*3/uL (ref 1.7–7.7)
Neutrophils Relative %: 64 %
Platelets: 170 10*3/uL (ref 150–400)
RBC: 3.6 MIL/uL — ABNORMAL LOW (ref 4.22–5.81)
RDW: 15.1 % (ref 11.5–15.5)
WBC: 6.1 10*3/uL (ref 4.0–10.5)
nRBC: 0 % (ref 0.0–0.2)

## 2019-03-21 MED ORDER — SODIUM CHLORIDE 0.9 % IV SOLN
200.0000 mg | Freq: Once | INTRAVENOUS | Status: AC
  Administered 2019-03-21: 200 mg via INTRAVENOUS
  Filled 2019-03-21: qty 200

## 2019-03-21 MED ORDER — PALONOSETRON HCL INJECTION 0.25 MG/5ML
0.2500 mg | Freq: Once | INTRAVENOUS | Status: AC
  Administered 2019-03-21: 0.25 mg via INTRAVENOUS
  Filled 2019-03-21: qty 5

## 2019-03-21 MED ORDER — SODIUM CHLORIDE 0.9 % IV SOLN
1920.0000 mg/m2 | INTRAVENOUS | Status: DC
  Administered 2019-03-21: 4300 mg via INTRAVENOUS
  Filled 2019-03-21: qty 86

## 2019-03-21 MED ORDER — SODIUM CHLORIDE 0.9 % IV SOLN: INTRAVENOUS | Status: DC

## 2019-03-21 MED ORDER — OXALIPLATIN CHEMO INJECTION 100 MG/20ML
67.0000 mg/m2 | Freq: Once | INTRAVENOUS | Status: AC
  Administered 2019-03-21: 150 mg via INTRAVENOUS
  Filled 2019-03-21: qty 20

## 2019-03-21 MED ORDER — DEXTROSE 5 % IV SOLN: Freq: Once | INTRAVENOUS | Status: AC

## 2019-03-21 MED ORDER — SODIUM CHLORIDE 0.9 % IV SOLN
Freq: Once | INTRAVENOUS | Status: AC
  Filled 2019-03-21: qty 5

## 2019-03-21 MED ORDER — LEUCOVORIN CALCIUM INJECTION 350 MG
400.0000 mg/m2 | Freq: Once | INTRAVENOUS | Status: AC
  Administered 2019-03-21: 900 mg via INTRAVENOUS
  Filled 2019-03-21: qty 45

## 2019-03-21 MED ORDER — ATROPINE SULFATE 1 MG/ML IJ SOLN
0.5000 mg | Freq: Once | INTRAMUSCULAR | Status: AC | PRN
  Administered 2019-03-21: 0.5 mg via INTRAVENOUS
  Filled 2019-03-21: qty 1

## 2019-03-21 MED ORDER — SODIUM CHLORIDE 0.9% FLUSH
10.0000 mL | INTRAVENOUS | Status: DC | PRN
  Administered 2019-03-21: 10 mL

## 2019-03-21 MED ORDER — FLUOROURACIL CHEMO INJECTION 2.5 GM/50ML
320.0000 mg/m2 | Freq: Once | INTRAVENOUS | Status: AC
  Administered 2019-03-21: 700 mg via INTRAVENOUS
  Filled 2019-03-21: qty 14

## 2019-03-21 NOTE — Progress Notes (Signed)
Patient has been assessed, vital signs and labs have been reviewed by Dr. Delton Coombes. ANC, Creatinine, LFTs, and Platelets are within treatment parameters per Dr. Delton Coombes. The patient is good to proceed with treatment at this time. Please also give dose of Feraheme today as well per Dr. Delton Coombes.

## 2019-03-21 NOTE — Patient Instructions (Addendum)
[place] at [facility] Discharge Instructions  You were seen today by Dr. Delton Coombes. He went over your recent lab results. He will give you a dose of IV Iron today. He will restart your chemotherapy today, please be sure to have imodium on hand for the diarrhea. He will see you back in 2 weeks for labs, treatment and follow up.   Thank you for choosing [place] at [facility] to provide your oncology and hematology care.  To afford each patient quality time with our provider, please arrive at least 15 minutes before your scheduled appointment time.   If you have a lab appointment with the [facility] please come in thru the  Main Entrance and check in at the main information desk  You need to re-schedule your appointment should you arrive 10 or more minutes late.  We strive to give you quality time with our providers, and arriving late affects you and other patients whose appointments are after yours.  Also, if you no show three or more times for appointments you may be dismissed from the clinic at the providers discretion.     Again, thank you for choosing [facility].  Our hope is that these requests will decrease the amount of time that you wait before being seen by our physicians.       _____________________________________________________________  Should you have questions after your visit to [facility], please contact our office at [phone] between the hours of 8:00 a.m. and 4:30 p.m.  Voicemails left after 4:00 p.m. will not be returned until the following business day.  For prescription refill requests, have your pharmacy contact our office and allow 72 hours.    Cancer Center Support Programs:   > Cancer Support Group  2nd Tuesday of the month 1pm-2pm, Journey Room

## 2019-03-21 NOTE — Assessment & Plan Note (Signed)
1.  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 -Cycle 1 of FOLFOX [place] and bevacizumab on 08/16/2018 with poor tolerance with severe nausea and diarrhea and worsening renal function. 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 -5 cycles of dose reduced FOLFOX and bevacizumab completed on 11/30/2018. -Laparoscopic-assisted partial hepatectomy, redo low anterior resection, ureteral stent placement on 02/01/2019. -Pathology showed residual grade two 1.9 cm adenocarcinoma at the rectosigmoid anastomosis, circumferential/radial/mesenteric margin involved, partial treatment effect present, yPT3 yPN0, 0/10 lymph nodes positive.  Segment 4B liver mass consistent with metastatic adenocarcinoma, margins negative. -We have recommended 3 months of FOLFOX followed by chemoradiation therapy. -We have reviewed his labs today.  He will proceed with dose reduced FOLFOX today. -We talked about the side effects and management at home.  I will increase the dose if he tolerates this cycle very well. -We will see him back in 2 weeks for follow-up.  2.  Cognitive dysfunction: -He will continue Aricept 10 mg daily.  This is stable.  3.  Normocytic anemia: -This is from combination of CKD and iron deficiency. -Labs on 03/13/2019 showed percent saturation 14 and ferritin of 59.  Hemoglobin today is 9.5. -I have recommended 2 infusions of Feraheme.  His insurance would only agree for Venofer.  He will receive 5 infusions.  4.  Hypokalemia: His potassium today is 3.6.  Oral potassium was discontinued on 02/27/2019.  5.  Gout: -He will continue allopurinol.  He does not report any joint pains.

## 2019-03-21 NOTE — Progress Notes (Signed)
0852 Labs reviewed with and pt seen by Dr. Delton Coombes and pt approved for FOLFOX infusion as well as Venofer infusion today per MD                                                                                    Troy Smith tolerated chemo tx and Venofer infusion well without complaints or incident. Pt discharged with 5FU pump infusing without issues. VSS upon discharge. Pt discharged self ambulatory in satisfactory condition

## 2019-03-21 NOTE — Patient Instructions (Signed)
[facility] at [facility] Discharge Instructions  Labs drawn from portacath today   Thank you for choosing [facility] at [facility] to provide your oncology and hematology care.  To afford each patient quality time with our provider, please arrive at least 15 minutes before your scheduled appointment time.   If you have a lab appointment with the Cancer Center please come in thru the Main Entrance and check in at the main information desk.  You need to re-schedule your appointment should you arrive 10 or more minutes late.  We strive to give you quality time with our providers, and arriving late affects you and other patients whose appointments are after yours.  Also, if you no show three or more times for appointments you may be dismissed from the clinic at the providers discretion.     Again, thank you for choosing [facility].  Our hope is that these requests will decrease the amount of time that you wait before being seen by our physicians.       _____________________________________________________________  Should you have questions after your visit to [facility], please contact our office at [phone] between the hours of 8:00 a.m. and 4:30 p.m.  Voicemails left after 4:00 p.m. will not be returned until the following business day.  For prescription refill requests, have your pharmacy contact our office and allow 72 hours.    Due to Covid, you will need to wear a mask upon entering the hospital. If you do not have a mask, a mask will be given to you at the Main Entrance upon arrival. For doctor visits, patients may have 1 support person with them. For treatment visits, patients can not have anyone with them due to social distancing guidelines and our immunocompromised population.     

## 2019-03-21 NOTE — Progress Notes (Signed)
[address], [place] [address]   CLINIC:  Medical Oncology/Hematology  PCP:  [name], [place] [id number] [phone]   REASON FOR VISIT: Follow-up for sigmoid colon cancer mets to the liver  CURRENT THERAPY: FOLFOX.  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 Focal narrowing and wall thickening of the sigmoid colon is noted in this area most likely due to inflammation, but neoplasm cannot be excluded, and sigmoidoscopy is recommended for further evaluation.   03/20/2016 Procedure   Colonoscopy by Dr. Laural Golden- Malignant partially obstructing tumor in the distal sigmoid colon. Biopsied. - Incomplete exam.   03/23/2016 Pathology Results   Colon, biopsy, sigmoid mass - ADENOCARCINOMA.   03/23/2016 Procedure   Low anterior resection with transanal anastomosis   03/26/2016 Pathology Results   Colon, segmental resection for tumor, sigmoid - INVASIVE WELL DIFFERENTIATED ADENOCARCINOMA, SPANNING 5 CM IN GREATEST DIMENSION. - TUMOR INVADES THROUGH MUSCULARIS PROPRIA TO INVOLVE SUBSEROSAL SOFT TISSUES. - UNDERLYING MARKED ACUTE AND CHRONIC INFLAMMATION WITH GIANT CELL REACTION AND ABSCESS FORMATION EXTENDING TO THE SEROSAL SURFACE AND INVOLVING THE MESENTERIC TISSUE. - MARGINS ARE NEGATIVE FOR TUMOR. - FOURTEEN BENIGN LYMPH NODES WHICH DEMONSTRATE VARIABLE INVOLVEMENT WITH ACUTE AND CHRONIC INFLAMMATION WITH ASSOCIATED GIANT CELLS WITHOUT TUMOR IDENTIFIED (0/14).   04/08/2016 Cancer Staging   Cancer Staging Adenocarcinoma of sigmoid colon [facility]) Staging form: Colon and Rectum, AJCC 8th Edition - Pathologic stage from 04/01/2016: Stage IIA (pT3, pN0, cM0) - Signed by Baird Cancer, PA-C on 04/01/2016    07/09/2016 Imaging   CT abd/pelvis- No evidence of metastatic disease or  other acute findings.  Bilateral nonobstructing renal calculi and mild bilateral renal parenchymal scarring.  Stable mildly enlarged prostate gland and findings of chronic bladder outlet obstruction.   08/16/2018 -  Chemotherapy   The patient had palonosetron (ALOXI) injection 0.25 mg, 0.25 mg, Intravenous,  Once, 7 of 12 cycles Administration: 0.25 mg (08/16/2018), 0.25 mg (09/07/2018), 0.25 mg (10/19/2018), 0.25 mg (11/02/2018), 0.25 mg (11/16/2018), 0.25 mg (11/30/2018), 0.25 mg (03/21/2019) pegfilgrastim (NEULASTA) injection 6 mg, 6 mg, Subcutaneous, Once, 7 of 12 cycles Administration: 6 mg (08/18/2018), 6 mg (09/09/2018), 6 mg (10/21/2018), 6 mg (11/04/2018), 6 mg (11/18/2018), 6 mg (12/02/2018) irinotecan (CAMPTOSAR) 380 mg in sodium chloride 0.9 % 500 mL chemo infusion, 165 mg/m2 = 380 mg, Intravenous,  Once, 1 of 1 cycle Administration: 380 mg (08/16/2018) leucovorin 900 mg in dextrose 5 % 250 mL infusion, 400 mg/m2 = 900 mg, Intravenous,  Once, 7 of 12 cycles Administration: 900 mg (08/16/2018), 900 mg (09/07/2018), 900 mg (10/19/2018), 900 mg (11/02/2018), 900 mg (11/16/2018), 800 mg (11/30/2018), 900 mg (03/21/2019) oxaliplatin (ELOXATIN) 190 mg in dextrose 5 % 500 mL chemo infusion, 85 mg/m2 = 190 mg, Intravenous,  Once, 7 of 12 cycles Dose modification: 68 mg/m2 (80 % of original dose 85 mg/m2, Cycle 2, Reason: Provider Judgment), 68 mg/m2 (80 % of original dose 85 mg/m2, Cycle 3, Reason: Provider Judgment) Administration: 190 mg (08/16/2018), 150 mg (09/07/2018), 150 mg (10/19/2018), 150 mg (11/16/2018), 150 mg (11/02/2018), 135 mg (11/30/2018), 150 mg (03/21/2019) fluorouracil (ADRUCIL) chemo injection 700 mg, 320 mg/m2 = 700 mg (100 % of original dose 320 mg/m2), Intravenous,  Once, 4 of 9 cycles Dose modification: 320 mg/m2 (original dose 320 mg/m2, Cycle 4) Administration: 700 mg (11/02/2018), 700 mg (11/16/2018), 650 mg (11/30/2018),  700 mg (03/21/2019) fosaprepitant (EMEND) 150 mg, dexamethasone (DECADRON) 12 mg  in sodium chloride 0.9 % 145 mL IVPB, , Intravenous,  Once, 7 of 12 cycles Administration:  (08/16/2018),  (09/07/2018),  (10/19/2018),  (11/02/2018),  (11/16/2018),  (11/30/2018),  (03/21/2019) fluorouracil (ADRUCIL) 5,400 mg in sodium chloride 0.9 % 142 mL chemo infusion, 2,400 mg/m2 = 5,400 mg, Intravenous, 1 Day/Dose, 7 of 12 cycles Dose modification: 1,920 mg/m2 (80 % of original dose 2,400 mg/m2, Cycle 3, Reason: Provider Judgment) Administration: 5,400 mg (08/16/2018), 5,400 mg (09/07/2018), 4,300 mg (10/19/2018), 4,300 mg (11/02/2018), 4,300 mg (11/16/2018), 3,850 mg (11/30/2018), 4,300 mg (03/21/2019) bevacizumab-awwb (MVASI) 500 mg in sodium chloride 0.9 % 100 mL chemo infusion, 5 mg/kg = 500 mg (100 % of original dose 5 mg/kg), Intravenous,  Once, 6 of 6 cycles Dose modification: 5 mg/kg (original dose 5 mg/kg, Cycle 1), 5 mg/kg (original dose 5 mg/kg, Cycle 2) Administration: 500 mg (08/16/2018), 500 mg (09/07/2018), 500 mg (10/19/2018), 500 mg (11/02/2018), 450 mg (11/16/2018), 400 mg (11/30/2018)  for chemotherapy treatment.       CANCER STAGING: Cancer Staging Adenocarcinoma of sigmoid colon [facility]) Staging form: Colon and Rectum, AJCC 8th Edition - Pathologic stage from 04/01/2016: Stage IIA (pT3, pN0, cM0) - Signed by Baird Cancer, PA-C on 04/01/2016    INTERVAL HISTORY:  Mr. Peddie 64 y.o. male seen for follow-up of his colon cancer.  He reports appetite 100%.  Energy level 75%.  Denies any tingling or numbness in the extremities.  Denies any joint pains.  Mild fatigue present which is stable.  REVIEW OF SYSTEMS:  Review of Systems  Constitutional: Positive for fatigue.  All other systems reviewed and are negative.    PAST MEDICAL/SURGICAL HISTORY:  Past Medical History:  Diagnosis Date  . 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 Carpal tunnel syndrome 05/02/2014   Bilateral  . Cervical disc disorder with radiculopathy of cervical region 05/02/2014   Left C8  . Diabetes  mellitus without complication ([place])   . Hypertension   . Port-A-Cath in place 08/10/2018   Past Surgical History:  Procedure Laterality Date  . BIOPSY  03/20/2016   Procedure: BIOPSY;  Surgeon: Rogene Houston, MD;  Location: [facility] ENDO [facility];  Service: Endoscopy;;  colon  . BOWEL RESECTION N/A 03/23/2016   Procedure: LOW ANTERIOR RESECTION;  Surgeon: Aviva Signs, MD;  Location: [facility] ORS;  Service: General;  Laterality: N/A;  . COLONOSCOPY N/A 03/20/2016   Procedure: COLONOSCOPY;  Surgeon: Rogene Houston, MD;  Location: [facility] ENDO SUITE;  Service: Endoscopy;  Laterality: N/A;  730  . COLONOSCOPY N/A 07/10/2016   Procedure: COLONOSCOPY;  Surgeon: Rogene Houston, MD;  Location: [facility] ENDO SUITE;  Service: Endoscopy;  Laterality: N/A;  1040  . NO PAST SURGERIES    . POLYPECTOMY  07/10/2016   Procedure: POLYPECTOMY;  Surgeon: Rogene Houston, MD;  Location: [facility] ENDO SUITE;  Service: Endoscopy;;  colon  . PORTACATH PLACEMENT Left 08/05/2018   Procedure: INSERTION PORT-A-CATH;  Surgeon: Aviva Signs, MD;  Location: [facility] ORS;  Service: General;  Laterality: Left;  . TONSILLECTOMY       SOCIAL HISTORY:  Social History   Socioeconomic History  . Marital status: Married    Spouse name: Not on file  . Number of children: Not on file  . Years of education: Not on file  . Highest education level: Not on file  Occupational History  . Not on file  Tobacco Use  . Smoking status: Never  Smoker  . Smokeless tobacco: Never Used  Substance and Sexual Activity  . Alcohol use: Yes    Comment: rarely  . Drug use: No  . Sexual activity: Not on file  Other Topics Concern  . Not on file  Social History Narrative  . Not on file   Social Determinants of Health   Financial Resource Strain:   . Difficulty of Paying Living Expenses: Not on file  Food Insecurity:   . Worried About Charity fundraiser in the Last Year: Not on file  . Ran Out of Food in the Last Year: Not on file  Transportation Needs:   . Lack of  Transportation (Medical): Not on file  . Lack of Transportation (Non-Medical): Not on file  Physical Activity:   . Days of Exercise per Week: Not on file  . Minutes of Exercise per Session: Not on file  Stress:   . Feeling of Stress : Not on file  Social Connections:   . Frequency of Communication with Friends and Family: Not on file  . Frequency of Social Gatherings with Friends and Family: Not on file  . Attends Religious Services: Not on file  . Active Member of Clubs or Organizations: Not on file  . Attends Archivist Meetings: Not on file  . Marital Status: Not on file  Intimate Partner Violence:   . Fear of Current or Ex-Partner: Not on file  . Emotionally Abused: Not on file  . Physically Abused: Not on file  . Sexually Abused: Not on file    FAMILY HISTORY:  Family History  Problem Relation Age of Onset  . COPD Mother   . Melanoma Father     CURRENT MEDICATIONS:  Outpatient Encounter Medications as of 03/21/2019  Medication Sig  . allopurinol (ZYLOPRIM) 300 MG tablet Take 1 tablet (300 mg total) by mouth daily.  Marland Kitchen amLODipine (NORVASC) 10 MG tablet Take 10 mg by mouth daily.  . Bevacizumab (AVASTIN IV) Inject into the vein every 14 (fourteen) days.  Marland Kitchen dextrose 5 % SOLN 1,000 mL with fluorouracil 5 GM/100ML SOLN Inject into the vein over 48 hr.  . DIPHENHIST 12.5 MG/5ML liquid SWISH AND SPIT/SWALLOWOONE TEASPOONFUL BY MOUTH 4 TIMES DAILY AS NEEEDE FOR MOUTH PAIN.  . diphenoxylate-atropine (LOMOTIL) 2.5-0.025 MG tablet Take 1 tablet by mouth 4 (four) times daily as needed for diarrhea or loose stools. (Patient not taking: Reported on 02/27/2019)  . donepezil (ARICEPT) 10 MG tablet Take 1 tablet (10 mg total) by mouth at bedtime.  Marland Kitchen FLUOROURACIL IV Inject into the vein every 14 (fourteen) days.  Marland Kitchen HYDROcodone-acetaminophen (NORCO) 5-325 MG tablet Take 1 tablet by mouth every 4 (four) hours as needed for moderate pain. (Patient not taking: Reported on 10/26/2018)  .  insulin glargine (LANTUS) 100 UNIT/ML injection Inject 30 Units into the skin at bedtime.   . IRINOTECAN HCL IV Inject into the vein every 14 (fourteen) days.  Marland Kitchen LEUCOVORIN CALCIUM IV Inject into the vein every 14 (fourteen) days.  Marland Kitchen lidocaine (XYLOCAINE) 2 % solution SMARTSIG:1 Teaspoon By Mouth 4 Times Daily PRN  . lidocaine-prilocaine (EMLA) cream Apply a small amount to port a cath site and cover with plastic wrap 1 hour prior to chemotherapy appointments (Patient not taking: Reported on 12/21/2018)  . loperamide (IMODIUM A-D) 2 MG tablet Take 2 at onset of diarrhea, then 1 after each watery bowel movement. (Patient not taking: Reported on 02/27/2019)  . magic mouthwash w/lidocaine SOLN Take 5 mLs by mouth  4 (four) times daily as needed for mouth pain. (Patient not taking: Reported on 10/26/2018)  . metoprolol succinate (TOPROL-XL) 50 MG 24 hr tablet Take 50 mg by mouth daily. Take with or immediately following a meal.  . MITIGARE 0.6 MG CAPS Take 1 capsule by mouth as needed.   . OXALIPLATIN IV Inject into the vein every 14 (fourteen) days.  . potassium chloride SA (K-DUR) 20 MEQ tablet Take 1 tablet (20 mEq total) by mouth daily.  . prochlorperazine (COMPAZINE) 10 MG tablet Take 1 tablet (10 mg total) by mouth every 6 (six) hours as needed (Nausea or vomiting).  . QC ANTACID/ANTI-GAS 200-200-20 MG/5ML suspension SMARTSIG:1 Teaspoon By Mouth 4 Times Daily PRN  . scopolamine (TRANSDERM-SCOP) 1 MG/3DAYS Place 1 patch (1.5 mg total) onto the skin every 3 (three) days. (Patient not taking: Reported on 03/13/2019)   No facility-administered encounter medications on file as of 03/21/2019.    ALLERGIES:  Allergies  Allergen Reactions  . Penicillin G Nausea And Vomiting     PHYSICAL EXAM:  ECOG Performance status: 1  Vital signs: Blood pressure is 125/82.  Pulse rate is 79.  Respirate is 18.  Temperature 98.  Saturations 100.  Physical Exam Cardiovascular:     Rate and Rhythm: Normal rate and  regular rhythm.     Heart sounds: Normal heart sounds.  Pulmonary:     Effort: Pulmonary effort is normal.     Breath sounds: Normal breath sounds.  Abdominal:     General: There is no distension.     Palpations: Abdomen is soft. There is no mass.  Musculoskeletal:        General: Normal range of motion.  Skin:    General: Skin is warm and dry.  Neurological:     Mental Status: He is alert and oriented to person, place, and time. Mental status is at baseline.  Psychiatric:        Mood and Affect: Mood normal.        Behavior: Behavior normal.        Thought Content: Thought content normal.        Judgment: Judgment normal.      LABORATORY DATA:  I have reviewed the labs as listed.  CBC    Component Value Date/Time   WBC 6.1 03/21/2019 0746   RBC 3.60 (L) 03/21/2019 0746   HGB 9.5 (L) 03/21/2019 0746   HCT 31.0 (L) 03/21/2019 0746   PLT 170 03/21/2019 0746   MCV 86.1 03/21/2019 0746   MCH 26.4 03/21/2019 0746   MCHC 30.6 03/21/2019 0746   RDW 15.1 03/21/2019 0746   LYMPHSABS 1.6 03/21/2019 0746   MONOABS 0.3 03/21/2019 0746   EOSABS 0.3 03/21/2019 0746   BASOSABS 0.0 03/21/2019 0746   CMP Latest Ref Rng & Units 03/21/2019 03/13/2019 02/27/2019  Glucose 70 - 99 mg/dL 95 143(H) 108(H)  BUN 8 - 23 mg/dL 20 21 22   Creatinine 0.61 - 1.24 mg/dL 1.13 1.19 1.39(H)  Sodium 135 - 145 mmol/L 142 142 141  Potassium 3.5 - 5.1 mmol/L 3.6 4.7 4.9  Chloride 98 - 111 mmol/L 108 109 106  CO2 22 - 32 mmol/L 27 27 27   Calcium 8.9 - 10.3 mg/dL 9.0 9.5 10.2  Total Protein 6.5 - 8.1 g/dL 6.2(L) 6.1(L) 6.6  Total Bilirubin 0.3 - 1.2 mg/dL 0.8 0.6 0.6  Alkaline Phos 38 - 126 U/L 61 62 65  AST 15 - 41 U/L 14(L) 14(L) 12(L)  ALT 0 - 44  U/L 13 12 10      I have independently reviewed scans and discussed with the patient.  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 -Cycle 1 of  FOLFOX [place] and bevacizumab on 08/16/2018 with poor tolerance with severe nausea and diarrhea and worsening renal function. 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 -5 cycles of dose reduced FOLFOX and bevacizumab completed on 11/30/2018. -Laparoscopic-assisted partial hepatectomy, redo low anterior resection, ureteral stent placement on 02/01/2019. -Pathology showed residual grade two 1.9 cm adenocarcinoma at the rectosigmoid anastomosis, circumferential/radial/mesenteric margin involved, partial treatment effect present, yPT3 yPN0, 0/10 lymph nodes positive.  Segment 4B liver mass consistent with metastatic adenocarcinoma, margins negative. -We have recommended 3 months of FOLFOX followed by chemoradiation therapy. -We have reviewed his labs today.  He will proceed with dose reduced FOLFOX today. -We talked about the side effects and management at home.  I will increase the dose if he tolerates this cycle very well. -We will see him back in 2 weeks for follow-up.  2.  Cognitive dysfunction: -He will continue Aricept 10 mg daily.  This is stable.  3.  Normocytic anemia: -This is from combination of CKD and iron deficiency. -Labs on 03/13/2019 showed percent saturation 14 and ferritin of 59.  Hemoglobin today is 9.5. -I have recommended 2 infusions of Feraheme.  His insurance would only agree for Venofer.  He will receive 5 infusions.  4.  Hypokalemia: His potassium today is 3.6.  Oral potassium was discontinued on 02/27/2019.  5.  Gout: -He will continue allopurinol.  He does not report any joint pains.     Orders placed this encounter:  Orders Placed This Encounter  Procedures  . CEA     Derek Jack, MD [place] [phone]

## 2019-03-21 NOTE — Patient Instructions (Addendum)
[facility] Discharge Instructions for Patients Receiving Chemotherapy   Beginning January 23rd 2017 [facility] in the  Main lab at [facility] on 1st floor. If you have a lab appointment with the [place] please come in thru the  Main Entrance and check in at the main information desk   Today you received the following chemotherapy agents Oxaliplatin,Leucovorin and 5FU as well as Venofer infusion. Follow-up as scheduled. Call clinic for any questions or concerns  To help prevent nausea and vomiting after your treatment, we encourage you to take your nausea medication   If you develop nausea and vomiting, or diarrhea that is not controlled by your medication, call the clinic.  The clinic phone number is [phone]. Office hours are Monday-Friday 8:30am-5:00pm.  BELOW ARE SYMPTOMS THAT SHOULD BE REPORTED IMMEDIATELY:  *FEVER GREATER THAN 101.0 F  *CHILLS WITH OR WITHOUT FEVER  NAUSEA AND VOMITING THAT IS NOT CONTROLLED WITH YOUR NAUSEA MEDICATION  *UNUSUAL SHORTNESS OF BREATH  *UNUSUAL BRUISING OR BLEEDING  TENDERNESS IN MOUTH AND THROAT WITH OR WITHOUT PRESENCE OF ULCERS  *URINARY PROBLEMS  *BOWEL PROBLEMS  UNUSUAL RASH Items with * indicate a potential emergency and should be followed up as soon as possible. If you have an emergency after office hours please contact your primary care physician or go to the nearest emergency department.  Please call the clinic during office hours if you have any questions or concerns.   You may also contact the Patient Navigator at [phone] should you have any questions or need assistance in obtaining follow up care.      Resources For Cancer Patients and their Caregivers ? American Cancer Society: Can assist with transportation, wigs, general needs, runs Look Good Feel Better.        (458)257-0535 ? Cancer Care: Provides financial assistance, online support groups,  medication/co-pay assistance.  1-800-813-HOPE (628) 802-0100) ? [place] Assists [place] cancer patients and their families through emotional , educational and financial support.  573-784-6801 ? [place] DSS Where to apply for food stamps, Medicaid and utility assistance. (952)424-8792 ? [facility]: Transportation to medical appointments. 385-622-3706 ? Social Security Administration: May apply for disability if have a Stage IV cancer. 916-518-8712 (915)216-6726 ? [employer], Disability and Transit Services: Assists with nutrition, care and transit needs. ([phone]

## 2019-03-21 NOTE — Progress Notes (Signed)
03/21/19  DC Feraheme due to not covered by insurance  Give Venofer 200 mg IVPB x 5 doses at least 2 days apart.  T.O. Dr Rhys Martini, PharmD

## 2019-03-22 LAB — CEA: CEA: 3.7 ng/mL (ref 0.0–4.7)

## 2019-03-23 ENCOUNTER — Encounter (HOSPITAL_COMMUNITY): Payer: 59

## 2019-03-23 ENCOUNTER — Other Ambulatory Visit: Payer: Self-pay

## 2019-03-23 ENCOUNTER — Inpatient Hospital Stay (HOSPITAL_COMMUNITY): Payer: 59

## 2019-03-23 ENCOUNTER — Encounter (HOSPITAL_COMMUNITY): Payer: Self-pay

## 2019-03-23 VITALS — BP 130/77 | HR 66 | Temp 96.9°F | Resp 18 | Wt 196.4 lb

## 2019-03-23 DIAGNOSIS — Z5111 Encounter for antineoplastic chemotherapy: Secondary | ICD-10-CM | POA: Diagnosis not present

## 2019-03-23 DIAGNOSIS — Z95828 Presence of other vascular implants and grafts: Secondary | ICD-10-CM

## 2019-03-23 DIAGNOSIS — C187 Malignant neoplasm of sigmoid colon: Secondary | ICD-10-CM

## 2019-03-23 DIAGNOSIS — D500 Iron deficiency anemia secondary to blood loss (chronic): Secondary | ICD-10-CM

## 2019-03-23 MED ORDER — SODIUM CHLORIDE 0.9% FLUSH
10.0000 mL | INTRAVENOUS | Status: DC | PRN
  Administered 2019-03-23 (×2): 10 mL

## 2019-03-23 MED ORDER — SODIUM CHLORIDE 0.9 % IV SOLN
200.0000 mg | Freq: Once | INTRAVENOUS | Status: AC
  Administered 2019-03-23: 14:00:00 200 mg via INTRAVENOUS
  Filled 2019-03-23: qty 200

## 2019-03-23 MED ORDER — PEGFILGRASTIM INJECTION 6 MG/0.6ML ~~LOC~~
6.0000 mg | PREFILLED_SYRINGE | Freq: Once | SUBCUTANEOUS | Status: AC
  Administered 2019-03-23: 15:00:00 6 mg via SUBCUTANEOUS
  Filled 2019-03-23: qty 0.6

## 2019-03-23 MED ORDER — SODIUM CHLORIDE 0.9 % IV SOLN: Freq: Once | INTRAVENOUS | Status: AC

## 2019-03-23 MED ORDER — HEPARIN SOD (PORK) LOCK FLUSH 100 UNIT/ML IV SOLN
500.0000 [IU] | Freq: Once | INTRAVENOUS | Status: AC | PRN
  Administered 2019-03-23: 15:00:00 500 [IU]

## 2019-03-23 NOTE — Progress Notes (Signed)
Troy Smith tolerated 5FU pump well without complaints or incident. 5FU pump discontinued with portacath flushed per protocol and Venofer infusion and Neulasta injection given today  and pt tolerated these procedures  well. VSS upon discharge. Pt discharged self ambulatory in satisfactory condition

## 2019-03-23 NOTE — Patient Instructions (Signed)
[place] at [facility] Discharge Instructions  5FU pump discontinued with portacath flushed and Venofer infusion and Neulasta injection given. Follow-up as scheduled. Call clinic for any questions or concerns   Thank you for choosing [place] at [facility] to provide your oncology and hematology care.  To afford each patient quality time with our provider, please arrive at least 15 minutes before your scheduled appointment time.   If you have a lab appointment with the [facility] please come in thru the Main Entrance and check in at the main information desk.  You need to re-schedule your appointment should you arrive 10 or more minutes late.  We strive to give you quality time with our providers, and arriving late affects you and other patients whose appointments are after yours.  Also, if you no show three or more times for appointments you may be dismissed from the clinic at the providers discretion.     Again, thank you for choosing [facility].  Our hope is that these requests will decrease the amount of time that you wait before being seen by our physicians.       _____________________________________________________________  Should you have questions after your visit to [facility], please contact our office at [phone] between the hours of 8:00 a.m. and 4:30 p.m.  Voicemails left after 4:00 p.m. will not be returned until the following business day.  For prescription refill requests, have your pharmacy contact our office and allow 72 hours.    Due to Covid, you will need to wear a mask upon entering the hospital. If you do not have a mask, a mask will be given to you at the Main Entrance upon arrival. For doctor visits, patients may have 1 support person with them. For treatment visits, patients can not have anyone with them due to social distancing guidelines and our immunocompromised population.

## 2019-03-27 ENCOUNTER — Inpatient Hospital Stay (HOSPITAL_COMMUNITY): Payer: 59

## 2019-03-27 ENCOUNTER — Encounter (HOSPITAL_COMMUNITY): Payer: Self-pay

## 2019-03-27 ENCOUNTER — Other Ambulatory Visit: Payer: Self-pay

## 2019-03-27 VITALS — BP 102/62 | HR 58 | Temp 96.9°F | Resp 18 | Wt 189.2 lb

## 2019-03-27 DIAGNOSIS — D500 Iron deficiency anemia secondary to blood loss (chronic): Secondary | ICD-10-CM

## 2019-03-27 DIAGNOSIS — Z5111 Encounter for antineoplastic chemotherapy: Secondary | ICD-10-CM | POA: Diagnosis not present

## 2019-03-27 MED ORDER — SODIUM CHLORIDE 0.9 % IV SOLN
200.0000 mg | Freq: Once | INTRAVENOUS | Status: AC
  Administered 2019-03-27: 14:00:00 200 mg via INTRAVENOUS
  Filled 2019-03-27: qty 200

## 2019-03-27 MED ORDER — HEPARIN SOD (PORK) LOCK FLUSH 100 UNIT/ML IV SOLN
500.0000 [IU] | Freq: Once | INTRAVENOUS | Status: AC | PRN
  Administered 2019-03-27: 500 [IU]

## 2019-03-27 MED ORDER — SODIUM CHLORIDE 0.9% FLUSH
10.0000 mL | Freq: Once | INTRAVENOUS | Status: AC | PRN
  Administered 2019-03-27: 13:00:00 10 mL

## 2019-03-27 MED ORDER — SODIUM CHLORIDE 0.9 % IV SOLN: Freq: Once | INTRAVENOUS | Status: AC

## 2019-03-27 NOTE — Progress Notes (Signed)
Troy Smith tolerated Venofer infusion well without complaints or incident. VSS upon discharge. Pt discharged self ambulatory in satisfactory condition

## 2019-03-27 NOTE — Patient Instructions (Signed)
[facility] at [facility] Discharge Instructions  Received Venofer infusion today. Follow-up as scheduled. Call clinic for any questions or concerns   Thank you for choosing [facility] at [facility] to provide your oncology and hematology care.  To afford each patient quality time with our provider, please arrive at least 15 minutes before your scheduled appointment time.   If you have a lab appointment with the Cancer Center please come in thru the Main Entrance and check in at the main information desk.  You need to re-schedule your appointment should you arrive 10 or more minutes late.  We strive to give you quality time with our providers, and arriving late affects you and other patients whose appointments are after yours.  Also, if you no show three or more times for appointments you may be dismissed from the clinic at the providers discretion.     Again, thank you for choosing [facility].  Our hope is that these requests will decrease the amount of time that you wait before being seen by our physicians.       _____________________________________________________________  Should you have questions after your visit to [facility], please contact our office at [phone] between the hours of 8:00 a.m. and 4:30 p.m.  Voicemails left after 4:00 p.m. will not be returned until the following business day.  For prescription refill requests, have your pharmacy contact our office and allow 72 hours.    Due to Covid, you will need to wear a mask upon entering the hospital. If you do not have a mask, a mask will be given to you at the Main Entrance upon arrival. For doctor visits, patients may have 1 support person with them. For treatment visits, patients can not have anyone with them due to social distancing guidelines and our immunocompromised population.     

## 2019-03-29 ENCOUNTER — Other Ambulatory Visit: Payer: Self-pay

## 2019-03-29 ENCOUNTER — Encounter (HOSPITAL_COMMUNITY): Payer: Self-pay

## 2019-03-29 ENCOUNTER — Inpatient Hospital Stay (HOSPITAL_COMMUNITY): Payer: 59

## 2019-03-29 VITALS — BP 128/72 | HR 64 | Temp 98.2°F | Resp 18

## 2019-03-29 DIAGNOSIS — Z5111 Encounter for antineoplastic chemotherapy: Secondary | ICD-10-CM | POA: Diagnosis not present

## 2019-03-29 DIAGNOSIS — D500 Iron deficiency anemia secondary to blood loss (chronic): Secondary | ICD-10-CM

## 2019-03-29 MED ORDER — SODIUM CHLORIDE 0.9% FLUSH
10.0000 mL | INTRAVENOUS | Status: DC | PRN
  Administered 2019-03-29: 10 mL via INTRAVENOUS

## 2019-03-29 MED ORDER — HEPARIN SOD (PORK) LOCK FLUSH 100 UNIT/ML IV SOLN
500.0000 [IU] | Freq: Once | INTRAVENOUS | Status: AC
  Administered 2019-03-29: 500 [IU] via INTRAVENOUS

## 2019-03-29 MED ORDER — SODIUM CHLORIDE 0.9 % IV SOLN
200.0000 mg | Freq: Once | INTRAVENOUS | Status: AC
  Administered 2019-03-29: 14:00:00 200 mg via INTRAVENOUS
  Filled 2019-03-29: qty 10

## 2019-03-29 MED ORDER — SODIUM CHLORIDE 0.9 % IV SOLN: Freq: Once | INTRAVENOUS | Status: AC

## 2019-03-29 NOTE — Progress Notes (Signed)
Grace Bushy [place] tolerated Venofer infusion well without complaints or incident. VSS upon discharge. Pt discharged self ambulatory in satisfactory condition

## 2019-03-29 NOTE — Patient Instructions (Signed)
[facility] at [facility] Discharge Instructions  Received Venofer infusion today. Follow-up as scheduled. Call clinic for any questions or concerns   Thank you for choosing [facility] at [facility] to provide your oncology and hematology care.  To afford each patient quality time with our provider, please arrive at least 15 minutes before your scheduled appointment time.   If you have a lab appointment with the Cancer Center please come in thru the Main Entrance and check in at the main information desk.  You need to re-schedule your appointment should you arrive 10 or more minutes late.  We strive to give you quality time with our providers, and arriving late affects you and other patients whose appointments are after yours.  Also, if you no show three or more times for appointments you may be dismissed from the clinic at the providers discretion.     Again, thank you for choosing [facility].  Our hope is that these requests will decrease the amount of time that you wait before being seen by our physicians.       _____________________________________________________________  Should you have questions after your visit to [facility], please contact our office at [phone] between the hours of 8:00 a.m. and 4:30 p.m.  Voicemails left after 4:00 p.m. will not be returned until the following business day.  For prescription refill requests, have your pharmacy contact our office and allow 72 hours.    Due to Covid, you will need to wear a mask upon entering the hospital. If you do not have a mask, a mask will be given to you at the Main Entrance upon arrival. For doctor visits, patients may have 1 support person with them. For treatment visits, patients can not have anyone with them due to social distancing guidelines and our immunocompromised population.     

## 2019-03-31 ENCOUNTER — Ambulatory Visit (HOSPITAL_COMMUNITY): Payer: 59

## 2019-04-03 ENCOUNTER — Inpatient Hospital Stay (HOSPITAL_COMMUNITY): Payer: 59

## 2019-04-03 ENCOUNTER — Other Ambulatory Visit: Payer: Self-pay

## 2019-04-03 VITALS — BP 120/67 | HR 66 | Temp 97.3°F | Resp 18

## 2019-04-03 DIAGNOSIS — D500 Iron deficiency anemia secondary to blood loss (chronic): Secondary | ICD-10-CM

## 2019-04-03 DIAGNOSIS — Z5111 Encounter for antineoplastic chemotherapy: Secondary | ICD-10-CM | POA: Diagnosis not present

## 2019-04-03 DIAGNOSIS — C187 Malignant neoplasm of sigmoid colon: Secondary | ICD-10-CM

## 2019-04-03 LAB — CBC WITH DIFFERENTIAL/PLATELET
Abs Immature Granulocytes: 0.07 10*3/uL (ref 0.00–0.07)
Basophils Absolute: 0 10*3/uL (ref 0.0–0.1)
Basophils Relative: 0 %
Eosinophils Absolute: 0.1 10*3/uL (ref 0.0–0.5)
Eosinophils Relative: 2 %
HCT: 33.4 % — ABNORMAL LOW (ref 39.0–52.0)
Hemoglobin: 10.2 g/dL — ABNORMAL LOW (ref 13.0–17.0)
Immature Granulocytes: 1 %
Lymphocytes Relative: 22 %
Lymphs Abs: 1.8 10*3/uL (ref 0.7–4.0)
MCH: 26.3 pg (ref 26.0–34.0)
MCHC: 30.5 g/dL (ref 30.0–36.0)
MCV: 86.1 fL (ref 80.0–100.0)
Monocytes Absolute: 0.4 10*3/uL (ref 0.1–1.0)
Monocytes Relative: 5 %
Neutro Abs: 5.6 10*3/uL (ref 1.7–7.7)
Neutrophils Relative %: 70 %
Platelets: 139 10*3/uL — ABNORMAL LOW (ref 150–400)
RBC: 3.88 MIL/uL — ABNORMAL LOW (ref 4.22–5.81)
RDW: 16.8 % — ABNORMAL HIGH (ref 11.5–15.5)
WBC: 8 10*3/uL (ref 4.0–10.5)
nRBC: 0 % (ref 0.0–0.2)

## 2019-04-03 LAB — COMPREHENSIVE METABOLIC PANEL
ALT: 9 U/L (ref 0–44)
AST: 12 U/L — ABNORMAL LOW (ref 15–41)
Albumin: 3.4 g/dL — ABNORMAL LOW (ref 3.5–5.0)
Alkaline Phosphatase: 69 U/L (ref 38–126)
Anion gap: 8 (ref 5–15)
BUN: 10 mg/dL (ref 8–23)
CO2: 26 mmol/L (ref 22–32)
Calcium: 9.2 mg/dL (ref 8.9–10.3)
Chloride: 107 mmol/L (ref 98–111)
Creatinine, Ser: 1.06 mg/dL (ref 0.61–1.24)
GFR calc Af Amer: >60 mL/min (ref >60)
GFR calc non Af Amer: >60 mL/min (ref >60)
Glucose, Bld: 161 mg/dL — ABNORMAL HIGH (ref 70–99)
Potassium: 3.5 mmol/L (ref 3.5–5.1)
Sodium: 141 mmol/L (ref 135–145)
Total Bilirubin: 0.5 mg/dL (ref 0.3–1.2)
Total Protein: 6 g/dL — ABNORMAL LOW (ref 6.5–8.1)

## 2019-04-03 MED ORDER — SODIUM CHLORIDE 0.9 % IV SOLN
200.0000 mg | Freq: Once | INTRAVENOUS | Status: AC
  Administered 2019-04-03: 14:00:00 200 mg via INTRAVENOUS
  Filled 2019-04-03: qty 200

## 2019-04-03 MED ORDER — SODIUM CHLORIDE 0.9% FLUSH
10.0000 mL | Freq: Once | INTRAVENOUS | Status: AC | PRN
  Administered 2019-04-03: 14:00:00 10 mL

## 2019-04-03 MED ORDER — SODIUM CHLORIDE 0.9 % IV SOLN: Freq: Once | INTRAVENOUS | Status: AC

## 2019-04-03 MED ORDER — HEPARIN SOD (PORK) LOCK FLUSH 100 UNIT/ML IV SOLN
500.0000 [IU] | Freq: Once | INTRAVENOUS | Status: AC
  Administered 2019-04-03: 15:00:00 500 [IU] via INTRAVENOUS

## 2019-04-03 NOTE — Progress Notes (Signed)
Labs drawn from port for tomorrow's chemotherapy and oncology follow up appointment.   Patient tolerated iron infusion with no complaints voiced.  Port site clean and dry with good blood return noted before and after infusion.  Band aid applied.  VSS with discharge and left ambulatory with no s/s of distress noted.

## 2019-04-04 ENCOUNTER — Inpatient Hospital Stay (HOSPITAL_BASED_OUTPATIENT_CLINIC_OR_DEPARTMENT_OTHER): Payer: 59 | Admitting: Hematology

## 2019-04-04 ENCOUNTER — Encounter (HOSPITAL_COMMUNITY): Payer: Self-pay | Admitting: Hematology

## 2019-04-04 ENCOUNTER — Other Ambulatory Visit (HOSPITAL_COMMUNITY): Payer: 59

## 2019-04-04 ENCOUNTER — Inpatient Hospital Stay (HOSPITAL_COMMUNITY): Payer: 59

## 2019-04-04 VITALS — BP 123/75 | HR 58 | Temp 97.9°F | Resp 18

## 2019-04-04 DIAGNOSIS — C187 Malignant neoplasm of sigmoid colon: Secondary | ICD-10-CM

## 2019-04-04 DIAGNOSIS — Z5111 Encounter for antineoplastic chemotherapy: Secondary | ICD-10-CM | POA: Diagnosis not present

## 2019-04-04 DIAGNOSIS — Z95828 Presence of other vascular implants and grafts: Secondary | ICD-10-CM

## 2019-04-04 MED ORDER — SODIUM CHLORIDE 0.9% FLUSH
10.0000 mL | INTRAVENOUS | Status: DC | PRN
  Administered 2019-04-04: 10 mL

## 2019-04-04 MED ORDER — DEXTROSE 5 % IV SOLN: Freq: Once | INTRAVENOUS | Status: AC

## 2019-04-04 MED ORDER — SODIUM CHLORIDE 0.9 % IV SOLN
Freq: Once | INTRAVENOUS | Status: AC
  Filled 2019-04-04: qty 5

## 2019-04-04 MED ORDER — PALONOSETRON HCL INJECTION 0.25 MG/5ML
0.2500 mg | Freq: Once | INTRAVENOUS | Status: AC
  Administered 2019-04-04: 09:00:00 0.25 mg via INTRAVENOUS
  Filled 2019-04-04: qty 5

## 2019-04-04 MED ORDER — LEUCOVORIN CALCIUM INJECTION 350 MG
400.0000 mg/m2 | Freq: Once | INTRAVENOUS | Status: AC
  Administered 2019-04-04: 10:00:00 900 mg via INTRAVENOUS
  Filled 2019-04-04: qty 10

## 2019-04-04 MED ORDER — SODIUM CHLORIDE 0.9 % IV SOLN
1920.0000 mg/m2 | INTRAVENOUS | Status: DC
  Administered 2019-04-04: 13:00:00 4300 mg via INTRAVENOUS
  Filled 2019-04-04: qty 86

## 2019-04-04 MED ORDER — ATROPINE SULFATE 1 MG/ML IJ SOLN
0.5000 mg | Freq: Once | INTRAMUSCULAR | Status: AC | PRN
  Administered 2019-04-04: 09:00:00 0.5 mg via INTRAVENOUS
  Filled 2019-04-04: qty 1

## 2019-04-04 MED ORDER — FLUOROURACIL CHEMO INJECTION 2.5 GM/50ML
320.0000 mg/m2 | Freq: Once | INTRAVENOUS | Status: AC
  Administered 2019-04-04: 13:00:00 700 mg via INTRAVENOUS
  Filled 2019-04-04: qty 14

## 2019-04-04 MED ORDER — OXALIPLATIN CHEMO INJECTION 100 MG/20ML
67.0000 mg/m2 | Freq: Once | INTRAVENOUS | Status: AC
  Administered 2019-04-04: 10:00:00 150 mg via INTRAVENOUS
  Filled 2019-04-04: qty 20

## 2019-04-04 NOTE — Progress Notes (Signed)
[place], [place] [address]   CLINIC:  Medical Oncology/Hematology  PCP:  [name], [place] [id number] [phone]   REASON FOR VISIT: Follow-up for sigmoid colon cancer mets to the liver  CURRENT THERAPY: FOLFOX.  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 Focal narrowing and wall thickening of the sigmoid colon is noted in this area most likely due to inflammation, but neoplasm cannot be excluded, and sigmoidoscopy is recommended for further evaluation.   03/20/2016 Procedure   Colonoscopy by Dr. Laural Golden- Malignant partially obstructing tumor in the distal sigmoid colon. Biopsied. - Incomplete exam.   03/23/2016 Pathology Results   Colon, biopsy, sigmoid mass - ADENOCARCINOMA.   03/23/2016 Procedure   Low anterior resection with transanal anastomosis   03/26/2016 Pathology Results   Colon, segmental resection for tumor, sigmoid - INVASIVE WELL DIFFERENTIATED ADENOCARCINOMA, SPANNING 5 CM IN GREATEST DIMENSION. - TUMOR INVADES THROUGH MUSCULARIS PROPRIA TO INVOLVE SUBSEROSAL SOFT TISSUES. - UNDERLYING MARKED ACUTE AND CHRONIC INFLAMMATION WITH GIANT CELL REACTION AND ABSCESS FORMATION EXTENDING TO THE SEROSAL SURFACE AND INVOLVING THE MESENTERIC TISSUE. - MARGINS ARE NEGATIVE FOR TUMOR. - FOURTEEN BENIGN LYMPH NODES WHICH DEMONSTRATE VARIABLE INVOLVEMENT WITH ACUTE AND CHRONIC INFLAMMATION WITH ASSOCIATED GIANT CELLS WITHOUT TUMOR IDENTIFIED (0/14).   04/08/2016 Cancer Staging   Cancer Staging Adenocarcinoma of sigmoid colon [facility]) Staging form: Colon and Rectum, AJCC 8th Edition - Pathologic stage from 04/01/2016: Stage IIA (pT3, pN0, cM0) - Signed by Baird Cancer, PA-C on 04/01/2016    07/09/2016 Imaging   CT abd/pelvis- No evidence of metastatic disease or  other acute findings.  Bilateral nonobstructing renal calculi and mild bilateral renal parenchymal scarring.  Stable mildly enlarged prostate gland and findings of chronic bladder outlet obstruction.   08/16/2018 -  Chemotherapy   The patient had palonosetron (ALOXI) injection 0.25 mg, 0.25 mg, Intravenous,  Once, 8 of 12 cycles Administration: 0.25 mg (08/16/2018), 0.25 mg (09/07/2018), 0.25 mg (10/19/2018), 0.25 mg (11/02/2018), 0.25 mg (11/16/2018), 0.25 mg (11/30/2018), 0.25 mg (03/21/2019), 0.25 mg (04/04/2019) pegfilgrastim (NEULASTA) injection 6 mg, 6 mg, Subcutaneous, Once, 8 of 12 cycles Administration: 6 mg (08/18/2018), 6 mg (09/09/2018), 6 mg (10/21/2018), 6 mg (11/04/2018), 6 mg (11/18/2018), 6 mg (12/02/2018), 6 mg (03/23/2019) irinotecan (CAMPTOSAR) 380 mg in sodium chloride 0.9 % 500 mL chemo infusion, 165 mg/m2 = 380 mg, Intravenous,  Once, 1 of 1 cycle Administration: 380 mg (08/16/2018) leucovorin 900 mg in dextrose 5 % 250 mL infusion, 400 mg/m2 = 900 mg, Intravenous,  Once, 8 of 12 cycles Administration: 900 mg (08/16/2018), 900 mg (09/07/2018), 900 mg (10/19/2018), 900 mg (11/02/2018), 900 mg (11/16/2018), 800 mg (11/30/2018), 900 mg (03/21/2019), 900 mg (04/04/2019) oxaliplatin (ELOXATIN) 190 mg in dextrose 5 % 500 mL chemo infusion, 85 mg/m2 = 190 mg, Intravenous,  Once, 8 of 12 cycles Dose modification: 68 mg/m2 (80 % of original dose 85 mg/m2, Cycle 2, Reason: Provider Judgment), 68 mg/m2 (80 % of original dose 85 mg/m2, Cycle 3, Reason: Provider Judgment) Administration: 190 mg (08/16/2018), 150 mg (09/07/2018), 150 mg (10/19/2018), 150 mg (11/16/2018), 150 mg (11/02/2018), 135 mg (11/30/2018), 150 mg (03/21/2019), 150 mg (04/04/2019) fluorouracil (ADRUCIL) chemo injection 700 mg, 320 mg/m2 = 700 mg (100 % of original dose 320 mg/m2), Intravenous,  Once, 5 of 9 cycles Dose modification: 320 mg/m2 (original dose 320 mg/m2,  Cycle 4) Administration: 700 mg (11/02/2018), 700 mg (11/16/2018), 650 mg (11/30/2018),  700 mg (03/21/2019), 700 mg (04/04/2019) fosaprepitant (EMEND) 150 mg, dexamethasone (DECADRON) 12 mg in sodium chloride 0.9 % 145 mL IVPB, , Intravenous,  Once, 8 of 12 cycles Administration:  (08/16/2018),  (09/07/2018),  (10/19/2018),  (11/02/2018),  (11/16/2018),  (11/30/2018),  (03/21/2019),  (04/04/2019) fluorouracil (ADRUCIL) 5,400 mg in sodium chloride 0.9 % 142 mL chemo infusion, 2,400 mg/m2 = 5,400 mg, Intravenous, 1 Day/Dose, 8 of 12 cycles Dose modification: 1,920 mg/m2 (80 % of original dose 2,400 mg/m2, Cycle 3, Reason: Provider Judgment) Administration: 5,400 mg (08/16/2018), 5,400 mg (09/07/2018), 4,300 mg (10/19/2018), 4,300 mg (11/02/2018), 4,300 mg (11/16/2018), 3,850 mg (11/30/2018), 4,300 mg (03/21/2019), 4,300 mg (04/04/2019) bevacizumab-awwb (MVASI) 500 mg in sodium chloride 0.9 % 100 mL chemo infusion, 5 mg/kg = 500 mg (100 % of original dose 5 mg/kg), Intravenous,  Once, 6 of 6 cycles Dose modification: 5 mg/kg (original dose 5 mg/kg, Cycle 1), 5 mg/kg (original dose 5 mg/kg, Cycle 2) Administration: 500 mg (08/16/2018), 500 mg (09/07/2018), 500 mg (10/19/2018), 500 mg (11/02/2018), 450 mg (11/16/2018), 400 mg (11/30/2018)  for chemotherapy treatment.       CANCER STAGING: Cancer Staging Adenocarcinoma of sigmoid colon [facility]) Staging form: Colon and Rectum, AJCC 8th Edition - Pathologic stage from 04/01/2016: Stage IIA (pT3, pN0, cM0) - Signed by Baird Cancer, PA-C on 04/01/2016    INTERVAL HISTORY:  Mr. Frieders 64 y.o. male seen for follow-up of his metastatic sigmoid colon cancer to the liver.  He is here for toxicity assessment prior to cycle 8 of chemotherapy.  Cycle 7 was on 03/21/2019.  He had diarrhea about 2 loose stools per day every other day.  He took Lomotil 2 to 3 tablets/day every other day which helped.  Numbness in the hands has been stable.  Appetite and energy levels are 75%.  Denies any nausea or vomiting.  REVIEW OF SYSTEMS:  Review of Systems  Gastrointestinal: Positive for  diarrhea.  Neurological: Positive for numbness.  All other systems reviewed and are negative.    PAST MEDICAL/SURGICAL HISTORY:  Past Medical History:  Diagnosis Date  . 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) 03/23/2016  . Carpal tunnel syndrome 05/02/2014   Bilateral  . Cervical disc disorder with radiculopathy of cervical region 05/02/2014   Left C8  . Diabetes mellitus without complication ([place])   . Hypertension   . Port-A-Cath in place 08/10/2018   Past Surgical History:  Procedure Laterality Date  . BIOPSY  03/20/2016   Procedure: BIOPSY;  Surgeon: Rogene Houston, MD;  [facility];  Service: Endoscopy;;  colon  . BOWEL RESECTION N/A 03/23/2016   Procedure: LOW ANTERIOR RESECTION;  Surgeon: Aviva Signs, MD;  Location: [facility] ORS;  Service: General;  Laterality: N/A;  . COLONOSCOPY N/A 03/20/2016   Procedure: COLONOSCOPY;  Surgeon: Rogene Houston, MD;  Location: [facility];  Service: Endoscopy;  Laterality: N/A;  730  . COLONOSCOPY N/A 07/10/2016   Procedure: COLONOSCOPY;  Surgeon: Rogene Houston, MD;  Location: [facility];  Service: Endoscopy;  Laterality: N/A;  1040  . NO PAST SURGERIES    . POLYPECTOMY  07/10/2016   Procedure: POLYPECTOMY;  Surgeon: Rogene Houston, MD;  Location: [facility];  Service: Endoscopy;;  colon  . PORTACATH PLACEMENT Left 08/05/2018   Procedure: INSERTION PORT-A-CATH;  Surgeon: Aviva Signs, MD;  Location: [facility] ORS;  Service: General;  Laterality: Left;  . TONSILLECTOMY  SOCIAL HISTORY:  Social History   Socioeconomic History  . Marital status: Married    Spouse name: Not on file  . Number of children: Not on file  . Years of education: Not on file  . Highest education level: Not on file  Occupational History  . Not on file  Tobacco Use  . Smoking status: Never Smoker  . Smokeless tobacco: Never Used  Substance and Sexual Activity  . Alcohol use: Yes    Comment: rarely  . Drug use: No  . Sexual activity: Not on file    Other Topics Concern  . Not on file  Social History Narrative  . Not on file   Social Determinants of Health   Financial Resource Strain:   . Difficulty of Paying Living Expenses: Not on file  Food Insecurity:   . Worried About Charity fundraiser in the Last Year: Not on file  . Ran Out of Food in the Last Year: Not on file  Transportation Needs:   . Lack of Transportation (Medical): Not on file  . Lack of Transportation (Non-Medical): Not on file  Physical Activity:   . Days of Exercise per Week: Not on file  . Minutes of Exercise per Session: Not on file  Stress:   . Feeling of Stress : Not on file  Social Connections:   . Frequency of Communication with Friends and Family: Not on file  . Frequency of Social Gatherings with Friends and Family: Not on file  . Attends Religious Services: Not on file  . Active Member of Clubs or Organizations: Not on file  . Attends Archivist Meetings: Not on file  . Marital Status: Not on file  Intimate Partner Violence:   . Fear of Current or Ex-Partner: Not on file  . Emotionally Abused: Not on file  . Physically Abused: Not on file  . Sexually Abused: Not on file    FAMILY HISTORY:  Family History  Problem Relation Age of Onset  . COPD Mother   . Melanoma Father     CURRENT MEDICATIONS:  Outpatient Encounter Medications as of 04/04/2019  Medication Sig  . allopurinol (ZYLOPRIM) 300 MG tablet Take 1 tablet (300 mg total) by mouth daily.  Marland Kitchen amLODipine (NORVASC) 10 MG tablet Take 10 mg by mouth daily.  . Bevacizumab (AVASTIN IV) Inject into the vein every 14 (fourteen) days.  Marland Kitchen dextrose 5 % SOLN 1,000 mL with fluorouracil 5 GM/100ML SOLN Inject into the vein over 48 hr.  . DIPHENHIST 12.5 MG/5ML liquid SWISH AND SPIT/SWALLOWOONE TEASPOONFUL BY MOUTH 4 TIMES DAILY AS NEEEDE FOR MOUTH PAIN.  Marland Kitchen donepezil (ARICEPT) 10 MG tablet Take 1 tablet (10 mg total) by mouth at bedtime.  Marland Kitchen FLUOROURACIL IV Inject into the vein every 14  (fourteen) days.  . insulin glargine (LANTUS) 100 UNIT/ML injection Inject 30 Units into the skin at bedtime.   . IRINOTECAN HCL IV Inject into the vein every 14 (fourteen) days.  Marland Kitchen LEUCOVORIN CALCIUM IV Inject into the vein every 14 (fourteen) days.  . metoprolol succinate (TOPROL-XL) 50 MG 24 hr tablet Take 50 mg by mouth daily. Take with or immediately following a meal.  . OXALIPLATIN IV Inject into the vein every 14 (fourteen) days.  . [DISCONTINUED] potassium chloride SA (K-DUR) 20 MEQ tablet Take 1 tablet (20 mEq total) by mouth daily.  . diphenoxylate-atropine (LOMOTIL) 2.5-0.025 MG tablet Take 1 tablet by mouth 4 (four) times daily as needed for diarrhea or loose stools. (  Patient not taking: Reported on 02/27/2019)  . HYDROcodone-acetaminophen (NORCO) 5-325 MG tablet Take 1 tablet by mouth every 4 (four) hours as needed for moderate pain. (Patient not taking: Reported on 10/26/2018)  . lidocaine (XYLOCAINE) 2 % solution SMARTSIG:1 Teaspoon By Mouth 4 Times Daily PRN  . lidocaine-prilocaine (EMLA) cream Apply a small amount to port a cath site and cover with plastic wrap 1 hour prior to chemotherapy appointments (Patient not taking: Reported on 12/21/2018)  . loperamide (IMODIUM A-D) 2 MG tablet Take 2 at onset of diarrhea, then 1 after each watery bowel movement. (Patient not taking: Reported on 02/27/2019)  . magic mouthwash w/lidocaine SOLN Take 5 mLs by mouth 4 (four) times daily as needed for mouth pain. (Patient not taking: Reported on 10/26/2018)  . MITIGARE 0.6 MG CAPS Take 1 capsule by mouth as needed.   . prochlorperazine (COMPAZINE) 10 MG tablet Take 1 tablet (10 mg total) by mouth every 6 (six) hours as needed (Nausea or vomiting). (Patient not taking: Reported on 04/04/2019)  . QC ANTACID/ANTI-GAS 200-200-20 MG/5ML suspension SMARTSIG:1 Teaspoon By Mouth 4 Times Daily PRN  . scopolamine (TRANSDERM-SCOP) 1 MG/3DAYS Place 1 patch (1.5 mg total) onto the skin every 3 (three) days.  (Patient not taking: Reported on 03/13/2019)   No facility-administered encounter medications on file as of 04/04/2019.    ALLERGIES:  Allergies  Allergen Reactions  . Penicillin G Nausea And Vomiting     PHYSICAL EXAM:  ECOG Performance status: 1  Vital signs: Blood pressure is 125/82.  Pulse rate is 79.  Respirate is 18.  Temperature 98.  Saturations 100.  Physical Exam Cardiovascular:     Rate and Rhythm: Normal rate and regular rhythm.     Heart sounds: Normal heart sounds.  Pulmonary:     Effort: Pulmonary effort is normal.     Breath sounds: Normal breath sounds.  Abdominal:     General: There is no distension.     Palpations: Abdomen is soft. There is no mass.  Musculoskeletal:        General: Normal range of motion.  Skin:    General: Skin is warm and dry.  Neurological:     Mental Status: He is alert and oriented to person, place, and time. Mental status is at baseline.  Psychiatric:        Mood and Affect: Mood normal.        Behavior: Behavior normal.        Thought Content: Thought content normal.        Judgment: Judgment normal.      LABORATORY DATA:  I have reviewed the labs as listed.  CBC    Component Value Date/Time   WBC 8.0 04/03/2019 1418   RBC 3.88 (L) 04/03/2019 1418   HGB 10.2 (L) 04/03/2019 1418   HCT 33.4 (L) 04/03/2019 1418   PLT 139 (L) 04/03/2019 1418   MCV 86.1 04/03/2019 1418   MCH 26.3 04/03/2019 1418   MCHC 30.5 04/03/2019 1418   RDW 16.8 (H) 04/03/2019 1418   LYMPHSABS 1.8 04/03/2019 1418   MONOABS 0.4 04/03/2019 1418   EOSABS 0.1 04/03/2019 1418   BASOSABS 0.0 04/03/2019 1418   CMP Latest Ref Rng & Units 04/03/2019 03/21/2019 03/13/2019  Glucose 70 - 99 mg/dL 161(H) 95 143(H)  BUN 8 - 23 mg/dL 10 20 21   Creatinine 0.61 - 1.24 mg/dL 1.06 1.13 1.19  Sodium 135 - 145 mmol/L 141 142 142  Potassium 3.5 - 5.1 mmol/L 3.5 3.6  4.7  Chloride 98 - 111 mmol/L 107 108 109  CO2 22 - 32 mmol/L 26 27 27   Calcium 8.9 - 10.3 mg/dL 9.2 9.0  9.5  Total Protein 6.5 - 8.1 g/dL 6.0(L) 6.2(L) 6.1(L)  Total Bilirubin 0.3 - 1.2 mg/dL 0.5 0.8 0.6  Alkaline Phos 38 - 126 U/L 69 61 62  AST 15 - 41 U/L 12(L) 14(L) 14(L)  ALT 0 - 44 U/L 9 13 12      I have independently reviewed scans and discussed with the patient.  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 -6 cycles of FOLFOX with bevacizumab preoperatively completed on 11/30/2018. -Single copy of UG T1 A1*28 allele indicating increased risk of neutropenia and other side effects of Irinotecan. -Laparoscopic-assisted partial hepatectomy, redo low anterior resection, ureteral stent placement on 02/01/2019. -Pathology shows residual grade 2, 1.9 cm adenocarcinoma at the rectosigmoid anastomosis, circumferential/radial/mesenteric margin involved, partial treatment effect present, YPT3Y PN 0, 0/10 lymph nodes positive.  Segment 4B liver mass consistent with metastatic adenocarcinoma, margins negative. -3 more months of FOLFOX followed by chemoradiation therapy was recommended. -He tolerated first cycle of postoperative therapy on 03/21/2019 very well. -I have reviewed his CBC which showed white count was adequate.  Mild thrombocytopenia at 139, therapy related.  Creatinine and LFTs are normal.  CEA on 03/21/2019 was 3.7. -He will proceed with his next cycle today.  We have already dose reduced his chemotherapy by 20%.  2.  Diarrhea: -He had diarrhea up to 2/day every other day.  He takes 2 to 3 tablets of Lomotil every other day which helps.  3.  Hypokalemia: -Potassium today is 3.5.  Oral potassium was discontinued on 02/27/2019.  4.  Normocytic anemia: -This is from combination of CKD and iron deficiency. -He has received 4 infusions of Venofer from 03/23/2019 through 04/03/2019.  Hemoglobin improved to 10.2.  5.  Cognitive dysfunction: -She will continue Aricept 10 mg daily.   This is stable at this time.  6.  Gout: -He will continue allopurinol.  Does not have any joint pains.     Orders placed this encounter:  No orders of the defined types were placed in this encounter.    Derek Jack, MD [place] [phone]

## 2019-04-04 NOTE — Progress Notes (Signed)
Labs reviewed with MD today. Proceed as planned with no changes at this time per MD.   Treatment given per orders. Patient tolerated it well without problems. Vitals stable and discharged home from clinic ambulatory. Follow up as scheduled.

## 2019-04-04 NOTE — Patient Instructions (Signed)
[place] at [facility] Discharge Instructions  You were seen today by Dr. Delton Coombes. He talked with you about how you have been feeling.  Your labs looked good today.  You will receive treatment and follow up in 2 weeks with next treatment and labs.     Thank you for choosing [place] at [facility] to provide your oncology and hematology care.  To afford each patient quality time with our provider, please arrive at least 15 minutes before your scheduled appointment time.   If you have a lab appointment with the [facility] please come in thru the Main Entrance and check in at the main information desk.  You need to re-schedule your appointment should you arrive 10 or more minutes late.  We strive to give you quality time with our providers, and arriving late affects you and other patients whose appointments are after yours.  Also, if you no show three or more times for appointments you may be dismissed from the clinic at the providers discretion.     Again, thank you for choosing [facility].  Our hope is that these requests will decrease the amount of time that you wait before being seen by our physicians.       _____________________________________________________________  Should you have questions after your visit to [facility], please contact our office at [phone] between the hours of 8:00 a.m. and 4:30 p.m.  Voicemails left after 4:00 p.m. will not be returned until the following business day.  For prescription refill requests, have your pharmacy contact our office and allow 72 hours.    Due to Covid, you will need to wear a mask upon entering the hospital. If you do not have a mask, a mask will be given to you at the Main Entrance upon arrival. For doctor visits, patients may have 1 support person with them. For treatment visits, patients can not have anyone with them due to social distancing guidelines and  our immunocompromised population.

## 2019-04-04 NOTE — Progress Notes (Signed)
Dr. Delton Coombes has seen and assessed the patient. He is okay to proceed with treatment today with no changes.

## 2019-04-05 NOTE — Assessment & Plan Note (Signed)
1.  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 -6 cycles of FOLFOX with bevacizumab preoperatively completed on 11/30/2018. -Single copy of UG T1 A1*28 allele indicating increased risk of neutropenia and other side effects of Irinotecan. -Laparoscopic-assisted partial hepatectomy, redo low anterior resection, ureteral stent placement on 02/01/2019. -Pathology shows residual grade 2, 1.9 cm adenocarcinoma at the rectosigmoid anastomosis, circumferential/radial/mesenteric margin involved, partial treatment effect present, YPT3Y PN 0, 0/10 lymph nodes positive.  Segment 4B liver mass consistent with metastatic adenocarcinoma, margins negative. -3 more months of FOLFOX followed by chemoradiation therapy was recommended. -He tolerated first cycle of postoperative therapy on 03/21/2019 very well. -I have reviewed his CBC which showed white count was adequate.  Mild thrombocytopenia at 139, therapy related.  Creatinine and LFTs are normal.  CEA on 03/21/2019 was 3.7. -He will proceed with his next cycle today.  We have already dose reduced his chemotherapy by 20%.  2.  Diarrhea: -He had diarrhea up to 2/day every other day.  He takes 2 to 3 tablets of Lomotil every other day which helps.  3.  Hypokalemia: -Potassium today is 3.5.  Oral potassium was discontinued on 02/27/2019.  4.  Normocytic anemia: -This is from combination of CKD and iron deficiency. -He has received 4 infusions of Venofer from 03/23/2019 through 04/03/2019.  Hemoglobin improved to 10.2.  5.  Cognitive dysfunction: -She will continue Aricept 10 mg daily.  This is stable at this time.  6.  Gout: -He will continue allopurinol.  Does not have any joint pains.

## 2019-04-06 ENCOUNTER — Other Ambulatory Visit: Payer: Self-pay

## 2019-04-06 ENCOUNTER — Encounter (HOSPITAL_COMMUNITY): Payer: Self-pay

## 2019-04-06 ENCOUNTER — Inpatient Hospital Stay (HOSPITAL_COMMUNITY): Payer: 59

## 2019-04-06 VITALS — BP 120/70 | HR 60 | Temp 97.8°F | Resp 18

## 2019-04-06 DIAGNOSIS — Z95828 Presence of other vascular implants and grafts: Secondary | ICD-10-CM

## 2019-04-06 DIAGNOSIS — Z5111 Encounter for antineoplastic chemotherapy: Secondary | ICD-10-CM | POA: Diagnosis not present

## 2019-04-06 DIAGNOSIS — C187 Malignant neoplasm of sigmoid colon: Secondary | ICD-10-CM

## 2019-04-06 MED ORDER — HEPARIN SOD (PORK) LOCK FLUSH 100 UNIT/ML IV SOLN
500.0000 [IU] | Freq: Once | INTRAVENOUS | Status: AC | PRN
  Administered 2019-04-06: 500 [IU]

## 2019-04-06 MED ORDER — PEGFILGRASTIM INJECTION 6 MG/0.6ML ~~LOC~~
6.0000 mg | PREFILLED_SYRINGE | Freq: Once | SUBCUTANEOUS | Status: AC
  Administered 2019-04-06: 13:00:00 6 mg via SUBCUTANEOUS
  Filled 2019-04-06: qty 0.6

## 2019-04-06 MED ORDER — SODIUM CHLORIDE 0.9% FLUSH
10.0000 mL | INTRAVENOUS | Status: DC | PRN
  Administered 2019-04-06: 13:00:00 10 mL

## 2019-04-06 NOTE — Progress Notes (Signed)
Chemo pump disconnected. Patients port flushed without difficulty.  Good blood return noted with no bruising or swelling noted at site.  Band aid applied.  VSS with discharge and left ambulatory with no s/s of distress noted.  

## 2019-04-18 ENCOUNTER — Inpatient Hospital Stay (HOSPITAL_COMMUNITY): Payer: 59

## 2019-04-18 ENCOUNTER — Inpatient Hospital Stay (HOSPITAL_COMMUNITY): Payer: 59 | Attending: Hematology | Admitting: Hematology

## 2019-04-18 ENCOUNTER — Other Ambulatory Visit: Payer: Self-pay

## 2019-04-18 VITALS — BP 137/90 | HR 70 | Temp 97.9°F | Resp 18

## 2019-04-18 DIAGNOSIS — D500 Iron deficiency anemia secondary to blood loss (chronic): Secondary | ICD-10-CM

## 2019-04-18 DIAGNOSIS — N189 Chronic kidney disease, unspecified: Secondary | ICD-10-CM | POA: Diagnosis not present

## 2019-04-18 DIAGNOSIS — D509 Iron deficiency anemia, unspecified: Secondary | ICD-10-CM | POA: Insufficient documentation

## 2019-04-18 DIAGNOSIS — Z5111 Encounter for antineoplastic chemotherapy: Secondary | ICD-10-CM | POA: Diagnosis present

## 2019-04-18 DIAGNOSIS — C187 Malignant neoplasm of sigmoid colon: Secondary | ICD-10-CM | POA: Insufficient documentation

## 2019-04-18 DIAGNOSIS — Z5189 Encounter for other specified aftercare: Secondary | ICD-10-CM | POA: Diagnosis not present

## 2019-04-18 DIAGNOSIS — C787 Secondary malignant neoplasm of liver and intrahepatic bile duct: Secondary | ICD-10-CM | POA: Diagnosis not present

## 2019-04-18 DIAGNOSIS — E876 Hypokalemia: Secondary | ICD-10-CM | POA: Insufficient documentation

## 2019-04-18 DIAGNOSIS — Z95828 Presence of other vascular implants and grafts: Secondary | ICD-10-CM

## 2019-04-18 LAB — CBC WITH DIFFERENTIAL/PLATELET
Abs Immature Granulocytes: 0.18 10*3/uL — ABNORMAL HIGH (ref 0.00–0.07)
Basophils Absolute: 0 10*3/uL (ref 0.0–0.1)
Basophils Relative: 0 %
Eosinophils Absolute: 0.1 10*3/uL (ref 0.0–0.5)
Eosinophils Relative: 2 %
HCT: 31.2 % — ABNORMAL LOW (ref 39.0–52.0)
Hemoglobin: 9.7 g/dL — ABNORMAL LOW (ref 13.0–17.0)
Immature Granulocytes: 2 %
Lymphocytes Relative: 24 %
Lymphs Abs: 1.8 10*3/uL (ref 0.7–4.0)
MCH: 26.7 pg (ref 26.0–34.0)
MCHC: 31.1 g/dL (ref 30.0–36.0)
MCV: 86 fL (ref 80.0–100.0)
Monocytes Absolute: 0.4 10*3/uL (ref 0.1–1.0)
Monocytes Relative: 5 %
Neutro Abs: 4.9 10*3/uL (ref 1.7–7.7)
Neutrophils Relative %: 67 %
Platelets: 116 10*3/uL — ABNORMAL LOW (ref 150–400)
RBC: 3.63 MIL/uL — ABNORMAL LOW (ref 4.22–5.81)
RDW: 18.3 % — ABNORMAL HIGH (ref 11.5–15.5)
WBC: 7.4 10*3/uL (ref 4.0–10.5)
nRBC: 0 % (ref 0.0–0.2)

## 2019-04-18 LAB — COMPREHENSIVE METABOLIC PANEL
ALT: 8 U/L (ref 0–44)
AST: 12 U/L — ABNORMAL LOW (ref 15–41)
Albumin: 3.3 g/dL — ABNORMAL LOW (ref 3.5–5.0)
Alkaline Phosphatase: 81 U/L (ref 38–126)
Anion gap: 10 (ref 5–15)
BUN: 11 mg/dL (ref 8–23)
CO2: 25 mmol/L (ref 22–32)
Calcium: 9.1 mg/dL (ref 8.9–10.3)
Chloride: 105 mmol/L (ref 98–111)
Creatinine, Ser: 1.05 mg/dL (ref 0.61–1.24)
GFR calc Af Amer: >60 mL/min (ref >60)
GFR calc non Af Amer: >60 mL/min (ref >60)
Glucose, Bld: 156 mg/dL — ABNORMAL HIGH (ref 70–99)
Potassium: 3.1 mmol/L — ABNORMAL LOW (ref 3.5–5.1)
Sodium: 140 mmol/L (ref 135–145)
Total Bilirubin: 0.4 mg/dL (ref 0.3–1.2)
Total Protein: 6 g/dL — ABNORMAL LOW (ref 6.5–8.1)

## 2019-04-18 MED ORDER — LEUCOVORIN CALCIUM INJECTION 350 MG
400.0000 mg/m2 | Freq: Once | INTRAVENOUS | Status: AC
  Administered 2019-04-18: 11:00:00 900 mg via INTRAVENOUS
  Filled 2019-04-18: qty 35

## 2019-04-18 MED ORDER — SODIUM CHLORIDE 0.9% FLUSH
10.0000 mL | INTRAVENOUS | Status: DC | PRN
  Administered 2019-04-18: 10 mL

## 2019-04-18 MED ORDER — SODIUM CHLORIDE 0.9 % IV SOLN: INTRAVENOUS | Status: DC

## 2019-04-18 MED ORDER — PALONOSETRON HCL INJECTION 0.25 MG/5ML
0.2500 mg | Freq: Once | INTRAVENOUS | Status: AC
  Administered 2019-04-18: 10:00:00 0.25 mg via INTRAVENOUS
  Filled 2019-04-18: qty 5

## 2019-04-18 MED ORDER — DEXTROSE 5 % IV SOLN: Freq: Once | INTRAVENOUS | Status: AC

## 2019-04-18 MED ORDER — POTASSIUM CHLORIDE CRYS ER 20 MEQ PO TBCR
20.0000 meq | EXTENDED_RELEASE_TABLET | Freq: Once | ORAL | Status: AC
  Administered 2019-04-18: 20 meq via ORAL
  Filled 2019-04-18: qty 1

## 2019-04-18 MED ORDER — FLUOROURACIL CHEMO INJECTION 2.5 GM/50ML
320.0000 mg/m2 | Freq: Once | INTRAVENOUS | Status: AC
  Administered 2019-04-18: 700 mg via INTRAVENOUS
  Filled 2019-04-18: qty 14

## 2019-04-18 MED ORDER — SODIUM CHLORIDE 0.9 % IV SOLN
1920.0000 mg/m2 | INTRAVENOUS | Status: DC
  Administered 2019-04-18: 14:00:00 4300 mg via INTRAVENOUS
  Filled 2019-04-18: qty 86

## 2019-04-18 MED ORDER — ATROPINE SULFATE 1 MG/ML IJ SOLN
0.5000 mg | Freq: Once | INTRAMUSCULAR | Status: AC | PRN
  Administered 2019-04-18: 0.5 mg via INTRAVENOUS
  Filled 2019-04-18: qty 1

## 2019-04-18 MED ORDER — OXALIPLATIN CHEMO INJECTION 100 MG/20ML
67.0000 mg/m2 | Freq: Once | INTRAVENOUS | Status: AC
  Administered 2019-04-18: 11:00:00 150 mg via INTRAVENOUS
  Filled 2019-04-18: qty 10

## 2019-04-18 MED ORDER — SODIUM CHLORIDE 0.9 % IV SOLN
Freq: Once | INTRAVENOUS | Status: AC
  Filled 2019-04-18: qty 5

## 2019-04-18 MED ORDER — SODIUM CHLORIDE 0.9 % IV SOLN
200.0000 mg | Freq: Once | INTRAVENOUS | Status: AC
  Administered 2019-04-18: 200 mg via INTRAVENOUS
  Filled 2019-04-18: qty 200

## 2019-04-18 NOTE — Patient Instructions (Signed)
[facility] Discharge Instructions for Patients Receiving Chemotherapy   Beginning January 23rd 2017 lab work for the [facility] will be done in the  Main lab at [facility] on 1st floor. If you have a lab appointment with the [facility] please come in thru the  Main Entrance and check in at the main information desk   Today you received the following chemotherapy agents Oxaliplatin,Leucovorin and 5FU as well as Venofer infusion. Follow-up as scheduled. Call clinic for any questions or concerns  To help prevent nausea and vomiting after your treatment, we encourage you to take your nausea medication   If you develop nausea and vomiting, or diarrhea that is not controlled by your medication, call the clinic.  The clinic phone number is [phone]. Office hours are Monday-Friday 8:30am-5:00pm.  BELOW ARE SYMPTOMS THAT SHOULD BE REPORTED IMMEDIATELY:  *FEVER GREATER THAN 101.0 F  *CHILLS WITH OR WITHOUT FEVER  NAUSEA AND VOMITING THAT IS NOT CONTROLLED WITH YOUR NAUSEA MEDICATION  *UNUSUAL SHORTNESS OF BREATH  *UNUSUAL BRUISING OR BLEEDING  TENDERNESS IN MOUTH AND THROAT WITH OR WITHOUT PRESENCE OF ULCERS  *URINARY PROBLEMS  *BOWEL PROBLEMS  UNUSUAL RASH Items with * indicate a potential emergency and should be followed up as soon as possible. If you have an emergency after office hours please contact your primary care physician or go to the nearest emergency department.  Please call the clinic during office hours if you have any questions or concerns.   You may also contact the Patient Navigator at [phone] should you have any questions or need assistance in obtaining follow up care.      Resources For Cancer Patients and their Caregivers ? American Cancer Society: Can assist with transportation, wigs, general needs, runs Look Good Feel Better.        213-450-8465 ? Cancer Care: Provides financial assistance, online support groups,  medication/co-pay assistance.  1-800-813-HOPE 559-280-9758) ? [place] Assists [place] cancer patients and their families through emotional , educational and financial support.  917-008-5255 ? [place] DSS Where to apply for food stamps, Medicaid and utility assistance. 506-559-9803 ? [facility]: Transportation to medical appointments. 7206432061 ? Social Security Administration: May apply for disability if have a Stage IV cancer. 519-731-6797 7602790747 ? [employer], Disability and Transit Services: Assists with nutrition, care and transit needs. 615-105-7340

## 2019-04-18 NOTE — Progress Notes (Signed)
[place], Troy Smith   CLINIC:  Medical Oncology/Hematology  PCP:  [name], [place] [address] [phone]   REASON FOR VISIT: Follow-up for sigmoid colon cancer mets to the liver  CURRENT THERAPY: FOLFOX.  BRIEF ONCOLOGIC HISTORY:  Oncology History  Adenocarcinoma of sigmoid colon ([place])  11/20/2015 Imaging   CT abd/pelvis- Focal sigmoid diverticulitis is noted without abscess formation. Focal narrowing and wall thickening of the sigmoid colon is noted in this area most likely due to inflammation, but neoplasm cannot be excluded, and sigmoidoscopy is recommended for further evaluation.   03/20/2016 Procedure   Colonoscopy by Dr. Laural Golden- Malignant partially obstructing tumor in the distal sigmoid colon. Biopsied. - Incomplete exam.   03/23/2016 Pathology Results   Colon, biopsy, sigmoid mass - ADENOCARCINOMA.   03/23/2016 Procedure   Low anterior resection with transanal anastomosis   03/26/2016 Pathology Results   Colon, segmental resection for tumor, sigmoid - INVASIVE WELL DIFFERENTIATED ADENOCARCINOMA, SPANNING 5 CM IN GREATEST DIMENSION. - TUMOR INVADES THROUGH MUSCULARIS PROPRIA TO INVOLVE SUBSEROSAL SOFT TISSUES. - UNDERLYING MARKED ACUTE AND CHRONIC INFLAMMATION WITH GIANT CELL REACTION AND ABSCESS FORMATION EXTENDING TO THE SEROSAL SURFACE AND INVOLVING THE MESENTERIC TISSUE. - MARGINS ARE NEGATIVE FOR TUMOR. - FOURTEEN BENIGN LYMPH NODES WHICH DEMONSTRATE VARIABLE INVOLVEMENT WITH ACUTE AND CHRONIC INFLAMMATION WITH ASSOCIATED GIANT CELLS WITHOUT TUMOR IDENTIFIED (0/14).   04/08/2016 Cancer Staging   Cancer Staging Adenocarcinoma of sigmoid colon [facility]) Staging form: Colon and Rectum, AJCC 8th Edition - Pathologic stage from 04/01/2016: Stage IIA (pT3, pN0, cM0) - Signed by Baird Cancer, PA-C on 04/01/2016    07/09/2016 Imaging   CT abd/pelvis- No evidence of metastatic disease or  other acute findings.  Bilateral nonobstructing renal calculi and mild bilateral renal parenchymal scarring.  Stable mildly enlarged prostate gland and findings of chronic bladder outlet obstruction.   08/16/2018 -  Chemotherapy   The patient had palonosetron (ALOXI) injection 0.25 mg, 0.25 mg, Intravenous,  Once, 9 of 12 cycles Administration: 0.25 mg (08/16/2018), 0.25 mg (09/07/2018), 0.25 mg (10/19/2018), 0.25 mg (11/02/2018), 0.25 mg (11/16/2018), 0.25 mg (11/30/2018), 0.25 mg (03/21/2019), 0.25 mg (04/04/2019), 0.25 mg (04/18/2019) pegfilgrastim (NEULASTA) injection 6 mg, 6 mg, Subcutaneous, Once, 9 of 12 cycles Administration: 6 mg (08/18/2018), 6 mg (09/09/2018), 6 mg (10/21/2018), 6 mg (11/04/2018), 6 mg (11/18/2018), 6 mg (12/02/2018), 6 mg (03/23/2019), 6 mg (04/06/2019) irinotecan (CAMPTOSAR) 380 mg in sodium chloride 0.9 % 500 mL chemo infusion, 165 mg/m2 = 380 mg, Intravenous,  Once, 1 of 1 cycle Administration: 380 mg (08/16/2018) leucovorin 900 mg in dextrose 5 % 250 mL infusion, 400 mg/m2 = 900 mg, Intravenous,  Once, 9 of 12 cycles Administration: 900 mg (08/16/2018), 900 mg (09/07/2018), 900 mg (10/19/2018), 900 mg (11/02/2018), 900 mg (11/16/2018), 800 mg (11/30/2018), 900 mg (03/21/2019), 900 mg (04/04/2019), 900 mg (04/18/2019) oxaliplatin (ELOXATIN) 190 mg in dextrose 5 % 500 mL chemo infusion, 85 mg/m2 = 190 mg, Intravenous,  Once, 9 of 12 cycles Dose modification: 68 mg/m2 (80 % of original dose 85 mg/m2, Cycle 2, Reason: Provider Judgment), 68 mg/m2 (80 % of original dose 85 mg/m2, Cycle 3, Reason: Provider Judgment) Administration: 190 mg (08/16/2018), 150 mg (09/07/2018), 150 mg (10/19/2018), 150 mg (11/16/2018), 150 mg (11/02/2018), 135 mg (11/30/2018), 150 mg (03/21/2019), 150 mg (04/04/2019), 150 mg (04/18/2019) fluorouracil (ADRUCIL) chemo injection 700 mg, 320 mg/m2 = 700 mg (100 % of original dose 320 mg/m2), Intravenous,  Once,  6 of 9 cycles Dose modification: 320 mg/m2 (original dose 320 mg/m2, Cycle 4)  Administration: 700 mg (11/02/2018), 700 mg (11/16/2018), 650 mg (11/30/2018), 700 mg (03/21/2019), 700 mg (04/04/2019), 700 mg (04/18/2019) fosaprepitant (EMEND) 150 mg, dexamethasone (DECADRON) 12 mg in sodium chloride 0.9 % 145 mL IVPB, , Intravenous,  Once, 9 of 12 cycles Administration:  (08/16/2018),  (09/07/2018),  (10/19/2018),  (11/02/2018),  (11/16/2018),  (11/30/2018),  (03/21/2019),  (04/04/2019),  (04/18/2019) fluorouracil (ADRUCIL) 5,400 mg in sodium chloride 0.9 % 142 mL chemo infusion, 2,400 mg/m2 = 5,400 mg, Intravenous, 1 Day/Dose, 9 of 12 cycles Dose modification: 1,920 mg/m2 (80 % of original dose 2,400 mg/m2, Cycle 3, Reason: Provider Judgment) Administration: 5,400 mg (08/16/2018), 5,400 mg (09/07/2018), 4,300 mg (10/19/2018), 4,300 mg (11/02/2018), 4,300 mg (11/16/2018), 3,850 mg (11/30/2018), 4,300 mg (03/21/2019), 4,300 mg (04/04/2019), 4,300 mg (04/18/2019) bevacizumab-awwb (MVASI) 500 mg in sodium chloride 0.9 % 100 mL chemo infusion, 5 mg/kg = 500 mg (100 % of original dose 5 mg/kg), Intravenous,  Once, 6 of 6 cycles Dose modification: 5 mg/kg (original dose 5 mg/kg, Cycle 1), 5 mg/kg (original dose 5 mg/kg, Cycle 2) Administration: 500 mg (08/16/2018), 500 mg (09/07/2018), 500 mg (10/19/2018), 500 mg (11/02/2018), 450 mg (11/16/2018), 400 mg (11/30/2018)  for chemotherapy treatment.       CANCER STAGING: Cancer Staging Adenocarcinoma of sigmoid colon [facility]) Staging form: Colon and Rectum, AJCC 8th Edition - Pathologic stage from 04/01/2016: Stage IIA (pT3, pN0, cM0) - Signed by Baird Cancer, PA-C on 04/01/2016    INTERVAL HISTORY:  Troy Smith 64 y.o. male seen for follow-up of metastatic sigmoid colon cancer to the liver.  He is here for toxicity assessment and next cycle of chemotherapy.  He reports diarrhea which is stable with Lomotil as needed.  Numbness in the fingertips has also been stable.  Denies any joint pains.  Appetite and energy levels are 75%.  He stopped taking potassium in January.   Denies any fevers or chills.  REVIEW OF SYSTEMS:  Review of Systems  Gastrointestinal: Positive for diarrhea.  Neurological: Positive for numbness.  All other systems reviewed and are negative.    PAST MEDICAL/SURGICAL HISTORY:  Past Medical History:  Diagnosis Date  . Adenocarcinoma of sigmoid colon ([place]) 03/23/2016  . Carpal tunnel syndrome 05/02/2014   Bilateral  . Cervical disc disorder with radiculopathy of cervical region 05/02/2014   Left C8  . Diabetes mellitus without complication ([place])   . Hypertension   . Port-A-Cath in place 08/10/2018   Past Surgical History:  Procedure Laterality Date  . BIOPSY  03/20/2016   Procedure: BIOPSY;  Surgeon: Rogene Houston, MD;  Location[facility];  Service: Endoscopy;;  colon  . BOWEL RESECTION N/A 03/23/2016   Procedure: LOW ANTERIOR RESECTION;  Surgeon: Aviva Signs, MD;  Location: [facility] ORS;  Service: General;  Laterality: N/A;  . COLONOSCOPY N/A 03/20/2016   Procedure: COLONOSCOPY;  Surgeon: Rogene Houston, MD;  Location: [facility] ENDO [facility];  Service: Endoscopy;  Laterality: N/A;  730  . COLONOSCOPY N/A 07/10/2016   Procedure: COLONOSCOPY;  Surgeon: Rogene Houston, MD;  Location: [facility];  Service: Endoscopy;  Laterality: N/A;  1040  . NO PAST SURGERIES    . POLYPECTOMY  07/10/2016   Procedure: POLYPECTOMY;  Surgeon: Rogene Houston, MD;  Location: [facility];  Service: Endoscopy;;  colon  . PORTACATH PLACEMENT Left 08/05/2018   Procedure: INSERTION PORT-A-CATH;  Surgeon: Aviva Signs, MD;  Location: [facility] ORS;  Service: General;  Laterality: Left;  . TONSILLECTOMY       SOCIAL HISTORY:  Social History   Socioeconomic History  . Marital status: Married    Spouse name: Not on file  . Number of children: Not on file  . Years of education: Not on file  . Highest education level: Not on file  Occupational History  . Not on file  Tobacco Use  . Smoking status: Never Smoker  . Smokeless tobacco: Never Used  Substance and  Sexual Activity  . Alcohol use: Yes    Comment: rarely  . Drug use: No  . Sexual activity: Not on file  Other Topics Concern  . Not on file  Social History Narrative  . Not on file   Social Determinants of Health   Financial Resource Strain:   . Difficulty of Paying Living Expenses: Not on file  Food Insecurity:   . Worried About Charity fundraiser in the Last Year: Not on file  . Ran Out of Food in the Last Year: Not on file  Transportation Needs:   . Lack of Transportation (Medical): Not on file  . Lack of Transportation (Non-Medical): Not on file  Physical Activity:   . Days of Exercise per Week: Not on file  . Minutes of Exercise per Session: Not on file  Stress:   . Feeling of Stress : Not on file  Social Connections:   . Frequency of Communication with Friends and Family: Not on file  . Frequency of Social Gatherings with Friends and Family: Not on file  . Attends Religious Services: Not on file  . Active Member of Clubs or Organizations: Not on file  . Attends Archivist Meetings: Not on file  . Marital Status: Not on file  Intimate Partner Violence:   . Fear of Current or Ex-Partner: Not on file  . Emotionally Abused: Not on file  . Physically Abused: Not on file  . Sexually Abused: Not on file    FAMILY HISTORY:  Family History  Problem Relation Age of Onset  . COPD Mother   . Melanoma Father     CURRENT MEDICATIONS:  Outpatient Encounter Medications as of 04/18/2019  Medication Sig  . allopurinol (ZYLOPRIM) 300 MG tablet Take 1 tablet (300 mg total) by mouth daily.  Marland Kitchen amLODipine (NORVASC) 10 MG tablet Take 10 mg by mouth daily.  . Bevacizumab (AVASTIN IV) Inject into the vein every 14 (fourteen) days.  Marland Kitchen dextrose 5 % SOLN 1,000 mL with fluorouracil 5 GM/100ML SOLN Inject into the vein over 48 hr.  . donepezil (ARICEPT) 10 MG tablet Take 1 tablet (10 mg total) by mouth at bedtime.  Marland Kitchen FLUOROURACIL IV Inject into the vein every 14 (fourteen) days.   . insulin glargine (LANTUS) 100 UNIT/ML injection Inject 30 Units into the skin at bedtime.   . IRINOTECAN HCL IV Inject into the vein every 14 (fourteen) days.  Marland Kitchen LEUCOVORIN CALCIUM IV Inject into the vein every 14 (fourteen) days.  . metoprolol succinate (TOPROL-XL) 50 MG 24 hr tablet Take 50 mg by mouth daily. Take with or immediately following a meal.  . OXALIPLATIN IV Inject into the vein every 14 (fourteen) days.  Marland Kitchen scopolamine (TRANSDERM-SCOP) 1 MG/3DAYS Place 1 patch (1.5 mg total) onto the skin every 3 (three) days. (Patient taking differently: Place 1 patch onto the skin as needed. )  . DIPHENHIST 12.5 MG/5ML liquid SWISH AND SPIT/SWALLOWOONE TEASPOONFUL BY MOUTH 4 TIMES DAILY AS NEEEDE FOR  MOUTH PAIN.  . diphenoxylate-atropine (LOMOTIL) 2.5-0.025 MG tablet Take 1 tablet by mouth 4 (four) times daily as needed for diarrhea or loose stools. (Patient not taking: Reported on 02/27/2019)  . HYDROcodone-acetaminophen (NORCO) 5-325 MG tablet Take 1 tablet by mouth every 4 (four) hours as needed for moderate pain. (Patient not taking: Reported on 10/26/2018)  . lidocaine (XYLOCAINE) 2 % solution SMARTSIG:1 Teaspoon By Mouth 4 Times Daily PRN  . lidocaine-prilocaine (EMLA) cream Apply a small amount to port a cath site and cover with plastic wrap 1 hour prior to chemotherapy appointments (Patient not taking: Reported on 12/21/2018)  . loperamide (IMODIUM A-D) 2 MG tablet Take 2 at onset of diarrhea, then 1 after each watery bowel movement. (Patient not taking: Reported on 02/27/2019)  . magic mouthwash w/lidocaine SOLN Take 5 mLs by mouth 4 (four) times daily as needed for mouth pain. (Patient not taking: Reported on 10/26/2018)  . MITIGARE 0.6 MG CAPS Take 1 capsule by mouth as needed.   . prochlorperazine (COMPAZINE) 10 MG tablet Take 1 tablet (10 mg total) by mouth every 6 (six) hours as needed (Nausea or vomiting). (Patient not taking: Reported on 04/04/2019)  . QC ANTACID/ANTI-GAS 200-200-20  MG/5ML suspension SMARTSIG:1 Teaspoon By Mouth 4 Times Daily PRN   No facility-administered encounter medications on file as of 04/18/2019.    ALLERGIES:  Allergies  Allergen Reactions  . Penicillin G Nausea And Vomiting     PHYSICAL EXAM:  ECOG Performance status: 1  Vital signs: Blood pressure is 125/82.  Pulse rate is 79.  Respirate is 18.  Temperature 98.  Saturations 100.  Physical Exam Cardiovascular:     Rate and Rhythm: Normal rate and regular rhythm.     Heart sounds: Normal heart sounds.  Pulmonary:     Effort: Pulmonary effort is normal.     Breath sounds: Normal breath sounds.  Abdominal:     General: There is no distension.     Palpations: Abdomen is soft. There is no mass.  Musculoskeletal:        General: Normal range of motion.  Skin:    General: Skin is warm and dry.  Neurological:     Mental Status: He is alert and oriented to person, place, and time. Mental status is at baseline.  Psychiatric:        Mood and Affect: Mood normal.        Behavior: Behavior normal.        Thought Content: Thought content normal.        Judgment: Judgment normal.      LABORATORY DATA:  I have reviewed the labs as listed.  CBC    Component Value Date/Time   WBC 7.4 04/18/2019 0834   RBC 3.63 (L) 04/18/2019 0834   HGB 9.7 (L) 04/18/2019 0834   HCT 31.2 (L) 04/18/2019 0834   PLT 116 (L) 04/18/2019 0834   MCV 86.0 04/18/2019 0834   MCH 26.7 04/18/2019 0834   MCHC 31.1 04/18/2019 0834   RDW 18.3 (H) 04/18/2019 0834   LYMPHSABS 1.8 04/18/2019 0834   MONOABS 0.4 04/18/2019 0834   EOSABS 0.1 04/18/2019 0834   BASOSABS 0.0 04/18/2019 0834   CMP Latest Ref Rng & Units 04/18/2019 04/03/2019 03/21/2019  Glucose 70 - 99 mg/dL 156(H) 161(H) 95  BUN 8 - 23 mg/dL '11 10 20  ' Creatinine 0.61 - 1.24 mg/dL 1.05 1.06 1.13  Sodium 135 - 145 mmol/L 140 141 142  Potassium 3.5 - 5.1 mmol/L 3.1(L) 3.5  3.6  Chloride 98 - 111 mmol/L 105 107 108  CO2 22 - 32 mmol/L '25 26 27  ' Calcium  8.9 - 10.3 mg/dL 9.1 9.2 9.0  Total Protein 6.5 - 8.1 g/dL 6.0(L) 6.0(L) 6.2(L)  Total Bilirubin 0.3 - 1.2 mg/dL 0.4 0.5 0.8  Alkaline Phos 38 - 126 U/L 81 69 61  AST 15 - 41 U/L 12(L) 12(L) 14(L)  ALT 0 - 44 U/L '8 9 13     ' I have independently reviewed the scans.  ASSESSMENT & PLAN:   Adenocarcinoma of sigmoid colon ([place]) 1.  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 -Single copy of UG T1 A1*28 allele indicating increased risk of neutropenia and other side effects of Irinotecan. -6 cycles of FOLFOX and bevacizumab preoperatively completed on 11/30/2018. -Laparoscopic partial hepatectomy, redo low anterior resection, ureteral stent placement on 02/01/2019. -Pathology shows residual grade 2 adenocarcinoma at the rectosigmoid anastomosis, circumferential/radial/mesenteric margin involved, partial treatment effect present, YPT3Y PN 0, 0/10 lymph nodes positive.  Segment 4B liver mass consistent with metastatic adenocarcinoma, margins negative. -3 more months of FOLFOX followed by chemoradiation therapy recommended. -2 cycles of FOLFOX completed on 04/04/2019. -Today we have reviewed his blood counts.  White count and platelet count are adequate to proceed with treatment.  LFTs are normal. -He will proceed with his third postoperative cycle.  We will continue with the dose reduction of chemotherapy by 20%.  We will reevaluate him in 2 weeks.  2.  Hypokalemia: -Potassium today is 3.1.  Oral potassium was discontinued on 02/27/2019. -We will restart him on potassium 20 mEq daily.  We will replete potassium today.  3.  Anemia: -This is combination anemia from CKD and iron deficiency.  Hemoglobin is 9.7.  Ferritin was 59 and percent saturation was 14 on 03/13/2019. -I have recommended Venofer with this treatment and next treatment.  4.  Diarrhea: -He has diarrhea up to twice daily every other day.  He is taking Lomotil 2 to 3 tablets as needed which helps.  5.   Cognitive dysfunction: -He will continue Aricept 10 mg daily.  This is stable at this time.  6.  Gout: -He has right hand middle finger swollen which is due to gout.  He will continue allopurinol.  Does not have any pain.     Orders placed this encounter:  Orders Placed This Encounter  Procedures  . CBC with Differential/Platelet  . Comprehensive metabolic panel  . Magnesium  . CEA     Derek Jack, MD [place] [phone]

## 2019-04-18 NOTE — Patient Instructions (Signed)
[facility] at [facility] Discharge Instructions  Labs drawn from portacath today   Thank you for choosing [facility] at [facility] to provide your oncology and hematology care.  To afford each patient quality time with our provider, please arrive at least 15 minutes before your scheduled appointment time.   If you have a lab appointment with the Cancer Center please come in thru the Main Entrance and check in at the main information desk.  You need to re-schedule your appointment should you arrive 10 or more minutes late.  We strive to give you quality time with our providers, and arriving late affects you and other patients whose appointments are after yours.  Also, if you no show three or more times for appointments you may be dismissed from the clinic at the providers discretion.     Again, thank you for choosing [facility].  Our hope is that these requests will decrease the amount of time that you wait before being seen by our physicians.       _____________________________________________________________  Should you have questions after your visit to [facility], please contact our office at [phone] between the hours of 8:00 a.m. and 4:30 p.m.  Voicemails left after 4:00 p.m. will not be returned until the following business day.  For prescription refill requests, have your pharmacy contact our office and allow 72 hours.    Due to Covid, you will need to wear a mask upon entering the hospital. If you do not have a mask, a mask will be given to you at the Main Entrance upon arrival. For doctor visits, patients may have 1 support person with them. For treatment visits, patients can not have anyone with them due to social distancing guidelines and our immunocompromised population.     

## 2019-04-18 NOTE — Assessment & Plan Note (Addendum)
1.  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 -Single copy of UG T1 A1*28 allele indicating increased risk of neutropenia and other side effects of Irinotecan. -6 cycles of FOLFOX and bevacizumab preoperatively completed on 11/30/2018. -Laparoscopic partial hepatectomy, redo low anterior resection, ureteral stent placement on 02/01/2019. -Pathology shows residual grade 2 adenocarcinoma at the rectosigmoid anastomosis, circumferential/radial/mesenteric margin involved, partial treatment effect present, YPT3Y PN 0, 0/10 lymph nodes positive.  Segment 4B liver mass consistent with metastatic adenocarcinoma, margins negative. -3 more months of FOLFOX followed by chemoradiation therapy recommended. -2 cycles of FOLFOX completed on 04/04/2019. -Today we have reviewed his blood counts.  White count and platelet count are adequate to proceed with treatment.  LFTs are normal. -He will proceed with his third postoperative cycle.  We will continue with the dose reduction of chemotherapy by 20%.  We will reevaluate him in 2 weeks.  2.  Hypokalemia: -Potassium today is 3.1.  Oral potassium was discontinued on 02/27/2019. -We will restart him on potassium 20 mEq daily.  We will replete potassium today.  3.  Anemia: -This is combination anemia from CKD and iron deficiency.  Hemoglobin is 9.7.  Ferritin was 59 and percent saturation was 14 on 03/13/2019. -I have recommended Venofer with this treatment and next treatment.  4.  Diarrhea: -He has diarrhea up to twice daily every other day.  He is taking Lomotil 2 to 3 tablets as needed which helps.  5.  Cognitive dysfunction: -He will continue Aricept 10 mg daily.  This is stable at this time.  6.  Gout: -He has right hand middle finger swollen which is due to gout.  He will continue allopurinol.  Does not have any pain.

## 2019-04-18 NOTE — Progress Notes (Signed)
[id number] Labs reviewed with and pt seen by Dr. Delton Coombes and pt approved for FOLFOX infusion today with K+ 20 meq PO and Venofer IV X 1 dose added today per MD                                        Olean Ree tolerated chemo tx and Venofer infusion well without complaints or incident. Pt discharged with 5FU pump infusing without issues. VSS upon discharge. Pt discharged self ambulatory in satisfactory condition

## 2019-04-18 NOTE — Progress Notes (Signed)
Patient has been assessed, vital signs and labs have been reviewed by Dr. Delton Coombes. ANC, Creatinine, LFTs, and Platelets are within treatment parameters, Potassium low today.  Please give Potassium 20 mEq PO today per Dr. Delton Coombes. The patient is good to proceed with treatment at this time. Venofer today due to low iron per Dr. Delton Coombes.  He will also get Venofer with next treatment as well.

## 2019-04-18 NOTE — Patient Instructions (Addendum)
[place] at [facility] Discharge Instructions  You were seen today by Dr. Delton Coombes. He went over your recent lab results. Restart the Potassium 20 mEq daily.  He will see you back in 2 weeks for labs, treatment and follow up.   Thank you for choosing [place] at [facility] to provide your oncology and hematology care.  To afford each patient quality time with our provider, please arrive at least 15 minutes before your scheduled appointment time.   If you have a lab appointment with the [facility] please come in thru the  Main Entrance and check in at the main information desk  You need to re-schedule your appointment should you arrive 10 or more minutes late.  We strive to give you quality time with our providers, and arriving late affects you and other patients whose appointments are after yours.  Also, if you no show three or more times for appointments you may be dismissed from the clinic at the providers discretion.     Again, thank you for choosing [facility].  Our hope is that these requests will decrease the amount of time that you wait before being seen by our physicians.       _____________________________________________________________  Should you have questions after your visit to [facility], please contact our office at [phone] between the hours of 8:00 a.m. and 4:30 p.m.  Voicemails left after 4:00 p.m. will not be returned until the following business day.  For prescription refill requests, have your pharmacy contact our office and allow 72 hours.    Cancer Center Support Programs:   > Cancer Support Group  2nd Tuesday of the month 1pm-2pm, Journey Room

## 2019-04-19 LAB — CEA: CEA: 3 ng/mL (ref 0.0–4.7)

## 2019-04-20 ENCOUNTER — Inpatient Hospital Stay (HOSPITAL_COMMUNITY): Payer: 59

## 2019-04-20 ENCOUNTER — Encounter (HOSPITAL_COMMUNITY): Payer: Self-pay

## 2019-04-20 ENCOUNTER — Other Ambulatory Visit: Payer: Self-pay

## 2019-04-20 VITALS — BP 138/85 | Temp 97.7°F | Resp 18

## 2019-04-20 DIAGNOSIS — Z5111 Encounter for antineoplastic chemotherapy: Secondary | ICD-10-CM | POA: Diagnosis not present

## 2019-04-20 DIAGNOSIS — Z95828 Presence of other vascular implants and grafts: Secondary | ICD-10-CM

## 2019-04-20 DIAGNOSIS — C187 Malignant neoplasm of sigmoid colon: Secondary | ICD-10-CM

## 2019-04-20 MED ORDER — PEGFILGRASTIM INJECTION 6 MG/0.6ML ~~LOC~~
6.0000 mg | PREFILLED_SYRINGE | Freq: Once | SUBCUTANEOUS | Status: AC
  Administered 2019-04-20: 6 mg via SUBCUTANEOUS
  Filled 2019-04-20: qty 0.6

## 2019-04-20 MED ORDER — HEPARIN SOD (PORK) LOCK FLUSH 100 UNIT/ML IV SOLN
500.0000 [IU] | Freq: Once | INTRAVENOUS | Status: AC | PRN
  Administered 2019-04-20: 500 [IU]

## 2019-04-20 MED ORDER — SODIUM CHLORIDE 0.9% FLUSH
10.0000 mL | INTRAVENOUS | Status: DC | PRN
  Administered 2019-04-20: 10 mL

## 2019-04-20 NOTE — Progress Notes (Signed)
Chemotherapy pump disconnected.  Patients port flushed without difficulty.  Good blood return noted with no bruising or swelling noted at site.  Band aid applied.  VSS with discharge and left ambulatory with no s/s of distress noted.   Patient tolerated Neulasta injection with no complaints voiced.  Site clean and dry with no bruising or swelling noted at site.  Band aid applied.  Vss with discharge and left ambulatory with no s/s of distress noted.

## 2019-05-02 ENCOUNTER — Other Ambulatory Visit: Payer: Self-pay

## 2019-05-02 ENCOUNTER — Inpatient Hospital Stay (HOSPITAL_COMMUNITY): Payer: 59

## 2019-05-02 ENCOUNTER — Inpatient Hospital Stay (HOSPITAL_BASED_OUTPATIENT_CLINIC_OR_DEPARTMENT_OTHER): Payer: 59 | Admitting: Hematology

## 2019-05-02 DIAGNOSIS — Z95828 Presence of other vascular implants and grafts: Secondary | ICD-10-CM

## 2019-05-02 DIAGNOSIS — Z5111 Encounter for antineoplastic chemotherapy: Secondary | ICD-10-CM | POA: Diagnosis not present

## 2019-05-02 DIAGNOSIS — C187 Malignant neoplasm of sigmoid colon: Secondary | ICD-10-CM

## 2019-05-02 LAB — CBC WITH DIFFERENTIAL/PLATELET
Abs Immature Granulocytes: 0.09 10*3/uL — ABNORMAL HIGH (ref 0.00–0.07)
Basophils Absolute: 0 10*3/uL (ref 0.0–0.1)
Basophils Relative: 0 %
Eosinophils Absolute: 0.1 10*3/uL (ref 0.0–0.5)
Eosinophils Relative: 2 %
HCT: 30.8 % — ABNORMAL LOW (ref 39.0–52.0)
Hemoglobin: 9.5 g/dL — ABNORMAL LOW (ref 13.0–17.0)
Immature Granulocytes: 1 %
Lymphocytes Relative: 25 %
Lymphs Abs: 1.6 10*3/uL (ref 0.7–4.0)
MCH: 26.5 pg (ref 26.0–34.0)
MCHC: 30.8 g/dL (ref 30.0–36.0)
MCV: 85.8 fL (ref 80.0–100.0)
Monocytes Absolute: 0.4 10*3/uL (ref 0.1–1.0)
Monocytes Relative: 6 %
Neutro Abs: 4.1 10*3/uL (ref 1.7–7.7)
Neutrophils Relative %: 66 %
Platelets: 127 10*3/uL — ABNORMAL LOW (ref 150–400)
RBC: 3.59 MIL/uL — ABNORMAL LOW (ref 4.22–5.81)
RDW: 18.8 % — ABNORMAL HIGH (ref 11.5–15.5)
WBC: 6.3 10*3/uL (ref 4.0–10.5)
nRBC: 0 % (ref 0.0–0.2)

## 2019-05-02 LAB — COMPREHENSIVE METABOLIC PANEL
ALT: 9 U/L (ref 0–44)
AST: 12 U/L — ABNORMAL LOW (ref 15–41)
Albumin: 3.3 g/dL — ABNORMAL LOW (ref 3.5–5.0)
Alkaline Phosphatase: 88 U/L (ref 38–126)
Anion gap: 8 (ref 5–15)
BUN: 13 mg/dL (ref 8–23)
CO2: 26 mmol/L (ref 22–32)
Calcium: 9.1 mg/dL (ref 8.9–10.3)
Chloride: 106 mmol/L (ref 98–111)
Creatinine, Ser: 0.96 mg/dL (ref 0.61–1.24)
GFR calc Af Amer: >60 mL/min (ref >60)
GFR calc non Af Amer: >60 mL/min (ref >60)
Glucose, Bld: 146 mg/dL — ABNORMAL HIGH (ref 70–99)
Potassium: 3.3 mmol/L — ABNORMAL LOW (ref 3.5–5.1)
Sodium: 140 mmol/L (ref 135–145)
Total Bilirubin: 0.7 mg/dL (ref 0.3–1.2)
Total Protein: 6.1 g/dL — ABNORMAL LOW (ref 6.5–8.1)

## 2019-05-02 LAB — MAGNESIUM: Magnesium: 1.6 mg/dL — ABNORMAL LOW (ref 1.7–2.4)

## 2019-05-02 MED ORDER — SODIUM CHLORIDE 0.9 % IV SOLN
1920.0000 mg/m2 | INTRAVENOUS | Status: DC
  Administered 2019-05-02: 4300 mg via INTRAVENOUS
  Filled 2019-05-02: qty 86

## 2019-05-02 MED ORDER — LEUCOVORIN CALCIUM INJECTION 350 MG
400.0000 mg/m2 | Freq: Once | INTRAVENOUS | Status: AC
  Administered 2019-05-02: 900 mg via INTRAVENOUS
  Filled 2019-05-02: qty 10

## 2019-05-02 MED ORDER — DEXTROSE 5 % IV SOLN: Freq: Once | INTRAVENOUS | Status: AC

## 2019-05-02 MED ORDER — SODIUM CHLORIDE 0.9 % IV SOLN: Freq: Once | INTRAVENOUS | Status: AC

## 2019-05-02 MED ORDER — ATROPINE SULFATE 1 MG/ML IJ SOLN
0.5000 mg | Freq: Once | INTRAMUSCULAR | Status: AC | PRN
  Administered 2019-05-02: 0.5 mg via INTRAVENOUS
  Filled 2019-05-02: qty 1

## 2019-05-02 MED ORDER — PALONOSETRON HCL INJECTION 0.25 MG/5ML
0.2500 mg | Freq: Once | INTRAVENOUS | Status: AC
  Administered 2019-05-02: 0.25 mg via INTRAVENOUS
  Filled 2019-05-02: qty 5

## 2019-05-02 MED ORDER — SODIUM CHLORIDE 0.9 % IV SOLN
10.0000 mg | Freq: Once | INTRAVENOUS | Status: AC
  Administered 2019-05-02: 10 mg via INTRAVENOUS
  Filled 2019-05-02: qty 10

## 2019-05-02 MED ORDER — MAGNESIUM SULFATE 2 GM/50ML IV SOLN
2.0000 g | Freq: Once | INTRAVENOUS | Status: AC
  Administered 2019-05-02: 2 g via INTRAVENOUS
  Filled 2019-05-02: qty 50

## 2019-05-02 MED ORDER — OXALIPLATIN CHEMO INJECTION 100 MG/20ML
67.0000 mg/m2 | Freq: Once | INTRAVENOUS | Status: AC
  Administered 2019-05-02: 150 mg via INTRAVENOUS
  Filled 2019-05-02: qty 20

## 2019-05-02 MED ORDER — SODIUM CHLORIDE 0.9 % IV SOLN
Freq: Once | INTRAVENOUS | Status: AC
  Filled 2019-05-02: qty 150

## 2019-05-02 MED ORDER — FLUOROURACIL CHEMO INJECTION 2.5 GM/50ML
320.0000 mg/m2 | Freq: Once | INTRAVENOUS | Status: AC
  Administered 2019-05-02: 700 mg via INTRAVENOUS
  Filled 2019-05-02: qty 14

## 2019-05-02 NOTE — Progress Notes (Signed)
Patient has been assessed, vital signs and labs have been reviewed by Dr. Delton Coombes. ANC, Creatinine, LFTs, and Platelets are within treatment parameters, Magnesium low today.  Please give Magnesium 2 grams IV today per Dr. Delton Coombes. The patient is good to proceed with treatment at this time.

## 2019-05-02 NOTE — Patient Instructions (Signed)
[facility] Discharge Instructions for Patients Receiving Chemotherapy  Today you received the following chemotherapy agents   To help prevent nausea and vomiting after your treatment, we encourage you to take your nausea medication   If you develop nausea and vomiting that is not controlled by your nausea medication, call the clinic.   BELOW ARE SYMPTOMS THAT SHOULD BE REPORTED IMMEDIATELY:  *FEVER GREATER THAN 100.5 F  *CHILLS WITH OR WITHOUT FEVER  NAUSEA AND VOMITING THAT IS NOT CONTROLLED WITH YOUR NAUSEA MEDICATION  *UNUSUAL SHORTNESS OF BREATH  *UNUSUAL BRUISING OR BLEEDING  TENDERNESS IN MOUTH AND THROAT WITH OR WITHOUT PRESENCE OF ULCERS  *URINARY PROBLEMS  *BOWEL PROBLEMS  UNUSUAL RASH Items with * indicate a potential emergency and should be followed up as soon as possible.  Feel free to call the clinic should you have any questions or concerns. The clinic phone number is [phone].  Please show the CHEMO ALERT CARD at check-in to the Emergency Department and triage nurse.   

## 2019-05-02 NOTE — Progress Notes (Signed)
Patient presents today for treatment and follow up visit with Dr. Delton Coombes. Labs pending. Vital signs within parameters for treatment. Patient has no complaints of any changes since his last visit. Patient denies pain today.   Message received from [facility] LPN/ Dr. Delton Coombes. Proceed with treatment today. Infuse 2 Grams Magnesium Sulfate . Magnesium level 1.6 today.   Treatment given today per MD orders. Tolerated infusion without adverse affects. Vital signs stable. RUN noted on the screen of the 5FU pump. Infusing per protocol. No complaints at this time. Discharged from clinic ambulatory. F/U with [facility] as scheduled.

## 2019-05-02 NOTE — Patient Instructions (Addendum)
[place] at [facility] Discharge Instructions  You were seen today by Dr. Delton Coombes. He went over your lab recent results. He will see you back in 2 weeks for labs and follow up.   Thank you for choosing [place] at [facility] to provide your oncology and hematology care.  To afford each patient quality time with our provider, please arrive at least 15 minutes before your scheduled appointment time.   If you have a lab appointment with the [facility] please come in thru the  Main Entrance and check in at the main information desk  You need to re-schedule your appointment should you arrive 10 or more minutes late.  We strive to give you quality time with our providers, and arriving late affects you and other patients whose appointments are after yours.  Also, if you no show three or more times for appointments you may be dismissed from the clinic at the providers discretion.     Again, thank you for choosing [facility].  Our hope is that these requests will decrease the amount of time that you wait before being seen by our physicians.       _____________________________________________________________  Should you have questions after your visit to [facility], please contact our office at [phone] between the hours of 8:00 a.m. and 4:30 p.m.  Voicemails left after 4:00 p.m. will not be returned until the following business day.  For prescription refill requests, have your pharmacy contact our office and allow 72 hours.    Cancer Center Support Programs:   > Cancer Support Group  2nd Tuesday of the month 1pm-2pm, Journey Room

## 2019-05-02 NOTE — Progress Notes (Signed)
Troy Smith, [place] [id number]   CLINIC:  Medical Oncology/Hematology  PCP:  Troy Smith, Troy [place] [id number] [phone]   REASON FOR VISIT: Follow-up for sigmoid colon Smith mets to the liver  CURRENT THERAPY: FOLFOX.  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 Focal narrowing and wall thickening of the sigmoid colon is noted in this area most likely due to inflammation, but neoplasm cannot be excluded, and sigmoidoscopy is recommended for further evaluation.   03/20/2016 Procedure   Colonoscopy by Dr. Laural Smith- Malignant partially obstructing tumor in the distal sigmoid colon. Biopsied. - Incomplete exam.   03/23/2016 Pathology Results   Colon, biopsy, sigmoid mass - ADENOCARCINOMA.   03/23/2016 Procedure   Low anterior resection with transanal anastomosis   03/26/2016 Pathology Results   Colon, segmental resection for tumor, sigmoid - INVASIVE WELL DIFFERENTIATED ADENOCARCINOMA, SPANNING 5 CM IN GREATEST DIMENSION. - TUMOR INVADES THROUGH MUSCULARIS PROPRIA TO INVOLVE SUBSEROSAL SOFT TISSUES. - UNDERLYING MARKED ACUTE AND CHRONIC INFLAMMATION WITH GIANT CELL REACTION AND ABSCESS FORMATION EXTENDING TO THE SEROSAL SURFACE AND INVOLVING THE MESENTERIC TISSUE. - MARGINS ARE NEGATIVE FOR TUMOR. - FOURTEEN BENIGN LYMPH NODES WHICH DEMONSTRATE VARIABLE INVOLVEMENT WITH ACUTE AND CHRONIC INFLAMMATION WITH ASSOCIATED GIANT CELLS WITHOUT TUMOR IDENTIFIED (0/14).   04/08/2016 Smith Staging   Smith Staging Adenocarcinoma of sigmoid colon [place] Smith) Staging form: Colon and Rectum, AJCC 8th Edition - Pathologic stage from 04/01/2016: Stage IIA (pT3, pN0, cM0) - Signed by Troy Cancer, PA-C on 04/01/2016    07/09/2016 Imaging   CT abd/pelvis- No evidence of metastatic disease or  other acute findings.  Bilateral nonobstructing renal calculi and mild bilateral renal parenchymal scarring.  Stable mildly enlarged prostate gland and findings of chronic bladder outlet obstruction.   08/16/2018 -  Chemotherapy   The patient had palonosetron (ALOXI) injection 0.25 mg, 0.25 mg, Intravenous,  Once, 10 of 12 cycles Administration: 0.25 mg (08/16/2018), 0.25 mg (09/07/2018), 0.25 mg (10/19/2018), 0.25 mg (11/02/2018), 0.25 mg (11/16/2018), 0.25 mg (11/30/2018), 0.25 mg (03/21/2019), 0.25 mg (04/04/2019), 0.25 mg (04/18/2019), 0.25 mg (05/02/2019) pegfilgrastim (NEULASTA) injection 6 mg, 6 mg, Subcutaneous, Once, 10 of 12 cycles Administration: 6 mg (08/18/2018), 6 mg (09/09/2018), 6 mg (10/21/2018), 6 mg (11/04/2018), 6 mg (11/18/2018), 6 mg (12/02/2018), 6 mg (03/23/2019), 6 mg (04/06/2019), 6 mg (04/20/2019), 6 mg (05/04/2019) irinotecan (CAMPTOSAR) 380 mg in sodium chloride 0.9 % 500 mL chemo infusion, 165 mg/m2 = 380 mg, Intravenous,  Once, 1 of 1 cycle Administration: 380 mg (08/16/2018) leucovorin 900 mg in dextrose 5 % 250 mL infusion, 400 mg/m2 = 900 mg, Intravenous,  Once, 10 of 12 cycles Administration: 900 mg (08/16/2018), 900 mg (09/07/2018), 900 mg (10/19/2018), 900 mg (11/02/2018), 900 mg (11/16/2018), 800 mg (11/30/2018), 900 mg (03/21/2019), 900 mg (04/04/2019), 900 mg (04/18/2019), 900 mg (05/02/2019) oxaliplatin (ELOXATIN) 190 mg in dextrose 5 % 500 mL chemo infusion, 85 mg/m2 = 190 mg, Intravenous,  Once, 10 of 12 cycles Dose modification: 68 mg/m2 (80 % of original dose 85 mg/m2, Cycle 2, Reason: Provider Judgment), 68 mg/m2 (80 % of original dose 85 mg/m2, Cycle 3, Reason: Provider Judgment) Administration: 190 mg (08/16/2018), 150 mg (09/07/2018), 150 mg (10/19/2018), 150 mg (11/16/2018), 150 mg (11/02/2018), 135 mg (11/30/2018), 150 mg (03/21/2019), 150 mg (04/04/2019), 150 mg (04/18/2019), 150 mg (05/02/2019) fluorouracil (ADRUCIL) chemo injection 700 mg,  320 mg/m2 = 700 mg (100 % of original dose 320 mg/m2),  Intravenous,  Once, 7 of 9 cycles Dose modification: 320 mg/m2 (original dose 320 mg/m2, Cycle 4) Administration: 700 mg (11/02/2018), 700 mg (11/16/2018), 650 mg (11/30/2018), 700 mg (03/21/2019), 700 mg (04/04/2019), 700 mg (04/18/2019), 700 mg (05/02/2019) fosaprepitant (EMEND) 150 mg, dexamethasone (DECADRON) 12 mg in sodium chloride 0.9 % 145 mL IVPB, , Intravenous,  Once, 10 of 12 cycles Administration:  (08/16/2018),  (09/07/2018),  (10/19/2018),  (11/02/2018),  (11/16/2018),  (11/30/2018),  (03/21/2019),  (04/04/2019),  (04/18/2019),  (05/02/2019) fluorouracil (ADRUCIL) 5,400 mg in sodium chloride 0.9 % 142 mL chemo infusion, 2,400 mg/m2 = 5,400 mg, Intravenous, 1 Day/Dose, 10 of 12 cycles Dose modification: 1,920 mg/m2 (80 % of original dose 2,400 mg/m2, Cycle 3, Reason: Provider Judgment) Administration: 5,400 mg (08/16/2018), 5,400 mg (09/07/2018), 4,300 mg (10/19/2018), 4,300 mg (11/02/2018), 4,300 mg (11/16/2018), 3,850 mg (11/30/2018), 4,300 mg (03/21/2019), 4,300 mg (04/04/2019), 4,300 mg (04/18/2019), 4,300 mg (05/02/2019) bevacizumab-awwb (MVASI) 500 mg in sodium chloride 0.9 % 100 mL chemo infusion, 5 mg/kg = 500 mg (100 % of original dose 5 mg/kg), Intravenous,  Once, 6 of 6 cycles Dose modification: 5 mg/kg (original dose 5 mg/kg, Cycle 1), 5 mg/kg (original dose 5 mg/kg, Cycle 2) Administration: 500 mg (08/16/2018), 500 mg (09/07/2018), 500 mg (10/19/2018), 500 mg (11/02/2018), 450 mg (11/16/2018), 400 mg (11/30/2018)  for chemotherapy treatment.       Smith STAGING: Smith Staging Adenocarcinoma of sigmoid colon [place] Smith) Staging form: Colon and Rectum, AJCC 8th Edition - Pathologic stage from 04/01/2016: Stage IIA (pT3, pN0, cM0) - Signed by Troy Cancer, PA-C on 04/01/2016    INTERVAL HISTORY:  Troy Smith 64 y.o. male seen for follow-up of metastatic sigmoid colon Smith to the liver and toxicity assessment prior to cycle 4 of chemotherapy.  Appetite and energy levels are 75%.  He is accompanied by his wife.   He reports some on and off tingling sensation in the fingertips.  He is not dropping things.  He does not have any difficulty picking up small things.  Had diarrhea which is controlled with Lomotil.  Denies any nausea or vomiting.  Reportedly taking potassium daily.  REVIEW OF SYSTEMS:  Review of Systems  Gastrointestinal: Positive for diarrhea.  Neurological: Positive for numbness.  All other systems reviewed and are negative.    PAST MEDICAL/SURGICAL HISTORY:  Past Medical History:  Diagnosis Date  . 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 Carpal tunnel syndrome 05/02/2014   Bilateral  . Cervical disc disorder with radiculopathy of cervical region 05/02/2014   Left C8  . Diabetes mellitus without complication ([place])   . Hypertension   . Port-A-Cath in place 08/10/2018   Past Surgical History:  Procedure Laterality Date  . BIOPSY  03/20/2016   Procedure: BIOPSY;  Surgeon: Rogene Houston, MD;  [facility];  Service: Endoscopy;;  colon  . BOWEL RESECTION N/A 03/23/2016   Procedure: LOW ANTERIOR RESECTION;  Surgeon: Aviva Signs, MD;  Location: [facility] OR[facility];  Service: General;  Laterality: N/A;  . COLONOSCOPY N/A 03/20/2016   Procedure: COLONOSCOPY;  Surgeon: Rogene Houston, MD;  Location: [facility];  Service: Endoscopy;  Laterality: N/A;  730  . COLONOSCOPY N/A 07/10/2016   Procedure: COLONOSCOPY;  Surgeon: Rogene Houston, MD;  Location: [facility];  Service: Endoscopy;  Laterality: N/A;  1040  . NO PAST SURGERIES    . POLYPECTOMY  07/10/2016   Procedure: POLYPECTOMY;  Surgeon: Rogene Houston, MD;  Location: [facility];  Service: Endoscopy;;  colon  . PORTACATH PLACEMENT Left 08/05/2018   Procedure: INSERTION PORT-A-CATH;  Surgeon: Aviva Signs, MD;  Location: [facility];  Service: General;  Laterality: Left;  . TONSILLECTOMY       SOCIAL HISTORY:  Social History   Socioeconomic History  . Marital status: Married    Spouse name: Not on file  . Number of  children: Not on file  . Years of education: Not on file  . Highest education level: Not on file  Occupational History  . Not on file  Tobacco Use  . Smoking status: Never Smoker  . Smokeless tobacco: Never Used  Substance and Sexual Activity  . Alcohol use: Yes    Comment: rarely  . Drug use: No  . Sexual activity: Not on file  Other Topics Concern  . Not on file  Social History Narrative  . Not on file   Social Determinants of Health   Financial Resource Strain:   . Difficulty of Paying Living Expenses:   Food Insecurity:   . Worried About Charity fundraiser in the Last Year:   . Arboriculturist in the Last Year:   Transportation Needs:   . Film/video editor (Medical):   Marland Kitchen Lack of Transportation (Non-Medical):   Physical Activity:   . Days of Exercise per Week:   . Minutes of Exercise per Session:   Stress:   . Feeling of Stress :   Social Connections:   . Frequency of Communication with Friends and Family:   . Frequency of Social Gatherings with Friends and Family:   . Attends Religious Services:   . Active Member of Clubs or Organizations:   . Attends Archivist Meetings:   Marland Kitchen Marital Status:   Intimate Partner Violence:   . Fear of Current or Ex-Partner:   . Emotionally Abused:   Marland Kitchen Physically Abused:   . Sexually Abused:     FAMILY HISTORY:  Family History  Problem Relation Age of Onset  . COPD Mother   . Melanoma Father     CURRENT MEDICATIONS:  Outpatient Encounter Medications as of 05/02/2019  Medication Sig  . allopurinol (ZYLOPRIM) 300 MG tablet Take 1 tablet (300 mg total) by mouth daily.  Marland Kitchen amLODipine (NORVASC) 10 MG tablet Take 10 mg by mouth daily.  . Bevacizumab (AVASTIN IV) Inject into the vein every 14 (fourteen) days.  Marland Kitchen dextrose 5 % SOLN 1,000 mL with fluorouracil 5 GM/100ML SOLN Inject into the vein over 48 hr.  . donepezil (ARICEPT) 10 MG tablet Take 1 tablet (10 mg total) by mouth at bedtime.  Marland Kitchen FLUOROURACIL IV Inject  into the vein every 14 (fourteen) days.  . insulin glargine (LANTUS) 100 UNIT/ML injection Inject 30 Units into the skin at bedtime.   . IRINOTECAN HCL IV Inject into the vein every 14 (fourteen) days.  Marland Kitchen LEUCOVORIN CALCIUM IV Inject into the vein every 14 (fourteen) days.  . metoprolol succinate (TOPROL-XL) 50 MG 24 hr tablet Take 50 mg by mouth daily. Take with or immediately following a meal.  . MITIGARE 0.6 MG CAPS Take 1 capsule by mouth as needed.   . OXALIPLATIN IV Inject into the vein every 14 (fourteen) days.  Marland Kitchen DIPHENHIST 12.5 MG/5ML liquid SWISH AND SPIT/SWALLOWOONE TEASPOONFUL BY MOUTH 4 TIMES DAILY AS NEEEDE FOR MOUTH PAIN.  . diphenoxylate-atropine (LOMOTIL) 2.5-0.025 MG tablet Take 1 tablet by mouth 4 (four) times  daily as needed for diarrhea or loose stools. (Patient not taking: Reported on 05/02/2019)  . HYDROcodone-acetaminophen (NORCO) 5-325 MG tablet Take 1 tablet by mouth every 4 (four) hours as needed for moderate pain. (Patient not taking: Reported on 05/02/2019)  . lidocaine (XYLOCAINE) 2 % solution SMARTSIG:1 Teaspoon By Mouth 4 Times Daily PRN  . lidocaine-prilocaine (EMLA) cream Apply a small amount to port a cath site and cover with plastic wrap 1 hour prior to chemotherapy appointments (Patient not taking: Reported on 05/02/2019)  . loperamide (IMODIUM A-D) 2 MG tablet Take 2 at onset of diarrhea, then 1 after each watery bowel movement. (Patient not taking: Reported on 05/02/2019)  . magic mouthwash w/lidocaine SOLN Take 5 mLs by mouth 4 (four) times daily as needed for mouth pain. (Patient not taking: Reported on 05/02/2019)  . prochlorperazine (COMPAZINE) 10 MG tablet Take 1 tablet (10 mg total) by mouth every 6 (six) hours as needed (Nausea or vomiting). (Patient not taking: Reported on 05/02/2019)  . QC ANTACID/ANTI-GAS 200-200-20 MG/5ML suspension SMARTSIG:1 Teaspoon By Mouth 4 Times Daily PRN  . scopolamine (TRANSDERM-SCOP) 1 MG/3DAYS Place 1 patch (1.5 mg total) onto  the skin every 3 (three) days. (Patient not taking: Reported on 05/02/2019)   No facility-administered encounter medications on file as of 05/02/2019.    ALLERGIES:  Allergies  Allergen Reactions  . Penicillin G Nausea And Vomiting     PHYSICAL EXAM:  ECOG Performance status: 1  Vital signs: Blood pressure is 125/82.  Pulse rate is 79.  Respirate is 18.  Temperature 98.  Saturations 100.  Physical Exam Cardiovascular:     Rate and Rhythm: Normal rate and regular rhythm.     Heart sounds: Normal heart sounds.  Pulmonary:     Effort: Pulmonary effort is normal.     Breath sounds: Normal breath sounds.  Abdominal:     General: There is no distension.     Palpations: Abdomen is soft. There is no mass.  Musculoskeletal:        General: Normal range of motion.  Skin:    General: Skin is warm and dry.  Neurological:     Mental Status: He is alert and oriented to person, place, and time. Mental status is at baseline.  Psychiatric:        Mood and Affect: Mood normal.        Behavior: Behavior normal.        Thought Content: Thought content normal.        Judgment: Judgment normal.      LABORATORY DATA:  I have reviewed the labs as listed.  CBC    Component Value Date/Time   WBC 6.3 05/02/2019 0833   RBC 3.59 (L) 05/02/2019 0833   HGB 9.5 (L) 05/02/2019 0833   HCT 30.8 (L) 05/02/2019 0833   PLT 127 (L) 05/02/2019 0833   MCV 85.8 05/02/2019 0833   MCH 26.5 05/02/2019 0833   MCHC 30.8 05/02/2019 0833   RDW 18.8 (H) 05/02/2019 0833   LYMPHSABS 1.6 05/02/2019 0833   MONOABS 0.4 05/02/2019 0833   EOSABS 0.1 05/02/2019 0833   BASOSABS 0.0 05/02/2019 0833   CMP Latest Ref Rng & Units 05/02/2019 04/18/2019 04/03/2019  Glucose 70 - 99 mg/dL 146(H) 156(H) 161(H)  BUN 8 - 23 mg/dL '13 11 10  ' Creatinine 0.61 - 1.24 mg/dL 0.96 1.05 1.06  Sodium 135 - 145 mmol/L 140 140 141  Potassium 3.5 - 5.1 mmol/L 3.3(L) 3.1(L) 3.5  Chloride 98 - 111  mmol/L 106 105 107  CO2 22 - 32 mmol/L  '26 25 26  ' Calcium 8.9 - 10.3 mg/dL 9.1 9.1 9.2  Total Protein 6.5 - 8.1 g/dL 6.1(L) 6.0(L) 6.0(L)  Total Bilirubin 0.3 - 1.2 mg/dL 0.7 0.4 0.5  Alkaline Phos 38 - 126 U/L 88 81 69  AST 15 - 41 U/L 12(L) 12(L) 12(L)  ALT 0 - 44 U/L '9 8 9     ' I have independently reviewed scans.  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  Metastatic sigmoid colon Smith to the liver: -MS-stable, NRAS wild-type. 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 -6 cycles of FOLFOX and bevacizumab preoperatively completed on 11/30/2018 followed by laparoscopic partial hepatectomy, redo low anterior resection, on 02/01/2019. -Pathology showed YPT3Y PN 0.  Positive circumferential/radial/mesenteric margin. -I have recommended 3 more months of FOLFOX followed by chemoradiation therapy. -3 cycles of FOLFOX from 03/21/2019 through 04/18/2019. -I have reviewed his labs.  White count is 6.3 and platelet count is slightly low at 127.  LFTs are normal. -We will proceed with dose reduced FOLFOX cycle 4 today.  I will reevaluate him in 2 weeks.  2.  Hypokalemia: -Potassium today is 3.3. -I have restarted him on potassium 20 mg daily at last visit.  He will continue oral potassium.  3.  Anemia: -He received 5 doses of Venofer from 03/23/2019 through 04/18/2019. -This is from CKD and iron deficiency.  Hemoglobin today is 9.5.  4.  Diarrhea: -He has diarrhea up to twice daily on and off, every other day.  He is taking Lomotil 2 to 3 tablets daily which is helping.  5.  Peripheral neuropathy: -He reported very mild tingling in the fingertips.  We will keep a close eye on it.  His oxaliplatin is 20% dose reduced. -He is not dropping things.     Orders placed this encounter:  No orders of the defined types were placed in this encounter.    Troy Jack, MD [place] [phone]

## 2019-05-04 ENCOUNTER — Inpatient Hospital Stay (HOSPITAL_COMMUNITY): Payer: 59

## 2019-05-04 ENCOUNTER — Encounter (HOSPITAL_COMMUNITY): Payer: Self-pay

## 2019-05-04 VITALS — BP 121/68 | HR 74 | Temp 96.8°F | Resp 18

## 2019-05-04 DIAGNOSIS — C187 Malignant neoplasm of sigmoid colon: Secondary | ICD-10-CM

## 2019-05-04 DIAGNOSIS — Z5111 Encounter for antineoplastic chemotherapy: Secondary | ICD-10-CM | POA: Diagnosis not present

## 2019-05-04 DIAGNOSIS — Z95828 Presence of other vascular implants and grafts: Secondary | ICD-10-CM

## 2019-05-04 MED ORDER — PEGFILGRASTIM INJECTION 6 MG/0.6ML ~~LOC~~
6.0000 mg | PREFILLED_SYRINGE | Freq: Once | SUBCUTANEOUS | Status: AC
  Administered 2019-05-04: 6 mg via SUBCUTANEOUS

## 2019-05-04 MED ORDER — SODIUM CHLORIDE 0.9% FLUSH
10.0000 mL | INTRAVENOUS | Status: DC | PRN
  Administered 2019-05-04: 10 mL

## 2019-05-04 MED ORDER — HEPARIN SOD (PORK) LOCK FLUSH 100 UNIT/ML IV SOLN
500.0000 [IU] | Freq: Once | INTRAVENOUS | Status: AC | PRN
  Administered 2019-05-04: 500 [IU]

## 2019-05-04 NOTE — Patient Instructions (Signed)
[place] at [facility] Discharge Instructions  5FU pump discontinued with portacath flushed per protocol and Neulasta injection given today. Follow-up as scheduled. Call clinic for any questions or concerns   Thank you for choosing [place] at [facility] to provide your oncology and hematology care.  To afford each patient quality time with our provider, please arrive at least 15 minutes before your scheduled appointment time.   If you have a lab appointment with the [facility] please come in thru the Main Entrance and check in at the main information desk.  You need to re-schedule your appointment should you arrive 10 or more minutes late.  We strive to give you quality time with our providers, and arriving late affects you and other patients whose appointments are after yours.  Also, if you no show three or more times for appointments you may be dismissed from the clinic at the providers discretion.     Again, thank you for choosing [facility].  Our hope is that these requests will decrease the amount of time that you wait before being seen by our physicians.       _____________________________________________________________  Should you have questions after your visit to [facility], please contact our office at [phone] between the hours of 8:00 a.m. and 4:30 p.m.  Voicemails left after 4:00 p.m. will not be returned until the following business day.  For prescription refill requests, have your pharmacy contact our office and allow 72 hours.    Due to Covid, you will need to wear a mask upon entering the hospital. If you do not have a mask, a mask will be given to you at the Main Entrance upon arrival. For doctor visits, patients may have 1 support person with them. For treatment visits, patients can not have anyone with them due to social distancing guidelines and our immunocompromised population.

## 2019-05-04 NOTE — Progress Notes (Signed)
Troy Smith tolerated 5FU pump well without complaints or incident. 5FU pump discontinued with portacath flushed easily per protocol then de-accessed. Pt also tolerated Neulasta injection well today. VSS Pt discharged self ambulatory in satisfactory condition

## 2019-05-05 NOTE — Assessment & Plan Note (Signed)
1.  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 -6 cycles of FOLFOX and bevacizumab preoperatively completed on 11/30/2018 followed by laparoscopic partial hepatectomy, redo low anterior resection, on 02/01/2019. -Pathology showed YPT3Y PN 0.  Positive circumferential/radial/mesenteric margin. -I have recommended 3 more months of FOLFOX followed by chemoradiation therapy. -3 cycles of FOLFOX from 03/21/2019 through 04/18/2019. -I have reviewed his labs.  White count is 6.3 and platelet count is slightly low at 127.  LFTs are normal. -We will proceed with dose reduced FOLFOX cycle 4 today.  I will reevaluate him in 2 weeks.  2.  Hypokalemia: -Potassium today is 3.3. -I have restarted him on potassium 20 mg daily at last visit.  He will continue oral potassium.  3.  Anemia: -He received 5 doses of Venofer from 03/23/2019 through 04/18/2019. -This is from CKD and iron deficiency.  Hemoglobin today is 9.5.  4.  Diarrhea: -He has diarrhea up to twice daily on and off, every other day.  He is taking Lomotil 2 to 3 tablets daily which is helping.  5.  Peripheral neuropathy: -He reported very mild tingling in the fingertips.  We will keep a close eye on it.  His oxaliplatin is 20% dose reduced. -He is not dropping things.

## 2019-05-09 ENCOUNTER — Other Ambulatory Visit (HOSPITAL_COMMUNITY): Payer: Self-pay | Admitting: *Deleted

## 2019-05-09 DIAGNOSIS — C187 Malignant neoplasm of sigmoid colon: Secondary | ICD-10-CM

## 2019-05-15 NOTE — Progress Notes (Signed)

## 2019-05-17 ENCOUNTER — Inpatient Hospital Stay (HOSPITAL_COMMUNITY): Payer: 59 | Attending: Hematology

## 2019-05-17 ENCOUNTER — Other Ambulatory Visit: Payer: Self-pay

## 2019-05-17 ENCOUNTER — Inpatient Hospital Stay (HOSPITAL_COMMUNITY): Payer: 59

## 2019-05-17 ENCOUNTER — Encounter (HOSPITAL_COMMUNITY): Payer: Self-pay | Admitting: Hematology

## 2019-05-17 ENCOUNTER — Inpatient Hospital Stay (HOSPITAL_BASED_OUTPATIENT_CLINIC_OR_DEPARTMENT_OTHER): Payer: 59 | Admitting: Hematology

## 2019-05-17 VITALS — BP 118/71 | HR 64 | Temp 97.1°F | Resp 14 | Wt 180.4 lb

## 2019-05-17 DIAGNOSIS — Z5189 Encounter for other specified aftercare: Secondary | ICD-10-CM | POA: Insufficient documentation

## 2019-05-17 DIAGNOSIS — C787 Secondary malignant neoplasm of liver and intrahepatic bile duct: Secondary | ICD-10-CM | POA: Diagnosis not present

## 2019-05-17 DIAGNOSIS — C187 Malignant neoplasm of sigmoid colon: Secondary | ICD-10-CM

## 2019-05-17 DIAGNOSIS — Z5111 Encounter for antineoplastic chemotherapy: Secondary | ICD-10-CM | POA: Diagnosis present

## 2019-05-17 DIAGNOSIS — E876 Hypokalemia: Secondary | ICD-10-CM

## 2019-05-17 DIAGNOSIS — Z95828 Presence of other vascular implants and grafts: Secondary | ICD-10-CM

## 2019-05-17 LAB — COMPREHENSIVE METABOLIC PANEL
ALT: 10 U/L (ref 0–44)
AST: 13 U/L — ABNORMAL LOW (ref 15–41)
Albumin: 3.4 g/dL — ABNORMAL LOW (ref 3.5–5.0)
Alkaline Phosphatase: 88 U/L (ref 38–126)
Anion gap: 9 (ref 5–15)
BUN: 9 mg/dL (ref 8–23)
CO2: 25 mmol/L (ref 22–32)
Calcium: 9.1 mg/dL (ref 8.9–10.3)
Chloride: 105 mmol/L (ref 98–111)
Creatinine, Ser: 0.97 mg/dL (ref 0.61–1.24)
GFR calc Af Amer: >60 mL/min (ref >60)
GFR calc non Af Amer: >60 mL/min (ref >60)
Glucose, Bld: 158 mg/dL — ABNORMAL HIGH (ref 70–99)
Potassium: 3.1 mmol/L — ABNORMAL LOW (ref 3.5–5.1)
Sodium: 139 mmol/L (ref 135–145)
Total Bilirubin: 0.6 mg/dL (ref 0.3–1.2)
Total Protein: 6.1 g/dL — ABNORMAL LOW (ref 6.5–8.1)

## 2019-05-17 LAB — CBC WITH DIFFERENTIAL/PLATELET
Abs Immature Granulocytes: 0.06 10*3/uL (ref 0.00–0.07)
Basophils Absolute: 0 10*3/uL (ref 0.0–0.1)
Basophils Relative: 0 %
Eosinophils Absolute: 0.1 10*3/uL (ref 0.0–0.5)
Eosinophils Relative: 2 %
HCT: 30.3 % — ABNORMAL LOW (ref 39.0–52.0)
Hemoglobin: 9.4 g/dL — ABNORMAL LOW (ref 13.0–17.0)
Immature Granulocytes: 1 %
Lymphocytes Relative: 18 %
Lymphs Abs: 1.6 10*3/uL (ref 0.7–4.0)
MCH: 26.8 pg (ref 26.0–34.0)
MCHC: 31 g/dL (ref 30.0–36.0)
MCV: 86.3 fL (ref 80.0–100.0)
Monocytes Absolute: 0.4 10*3/uL (ref 0.1–1.0)
Monocytes Relative: 5 %
Neutro Abs: 6.6 10*3/uL (ref 1.7–7.7)
Neutrophils Relative %: 74 %
Platelets: 148 10*3/uL — ABNORMAL LOW (ref 150–400)
RBC: 3.51 MIL/uL — ABNORMAL LOW (ref 4.22–5.81)
RDW: 19.7 % — ABNORMAL HIGH (ref 11.5–15.5)
WBC: 8.9 10*3/uL (ref 4.0–10.5)
nRBC: 0 % (ref 0.0–0.2)

## 2019-05-17 LAB — MAGNESIUM: Magnesium: 1.6 mg/dL — ABNORMAL LOW (ref 1.7–2.4)

## 2019-05-17 MED ORDER — POTASSIUM CHLORIDE CRYS ER 20 MEQ PO TBCR
EXTENDED_RELEASE_TABLET | ORAL | Status: AC
  Filled 2019-05-17: qty 2

## 2019-05-17 MED ORDER — SODIUM CHLORIDE 0.9 % IV SOLN
10.0000 mg | Freq: Once | INTRAVENOUS | Status: AC
  Administered 2019-05-17: 10 mg via INTRAVENOUS
  Filled 2019-05-17: qty 10

## 2019-05-17 MED ORDER — SODIUM CHLORIDE 0.9 % IV SOLN
Freq: Once | INTRAVENOUS | Status: AC
  Filled 2019-05-17: qty 150

## 2019-05-17 MED ORDER — PALONOSETRON HCL INJECTION 0.25 MG/5ML
0.2500 mg | Freq: Once | INTRAVENOUS | Status: AC
  Administered 2019-05-17: 10:00:00 0.25 mg via INTRAVENOUS
  Filled 2019-05-17: qty 5

## 2019-05-17 MED ORDER — DEXTROSE 5 % IV SOLN: Freq: Once | INTRAVENOUS | Status: AC

## 2019-05-17 MED ORDER — OXALIPLATIN CHEMO INJECTION 100 MG/20ML
67.0000 mg/m2 | Freq: Once | INTRAVENOUS | Status: AC
  Administered 2019-05-17: 150 mg via INTRAVENOUS
  Filled 2019-05-17: qty 20

## 2019-05-17 MED ORDER — POTASSIUM CHLORIDE CRYS ER 20 MEQ PO TBCR: 40.0000 meq | EXTENDED_RELEASE_TABLET | Freq: Once | ORAL | Status: DC

## 2019-05-17 MED ORDER — LEUCOVORIN CALCIUM INJECTION 350 MG
400.0000 mg/m2 | Freq: Once | INTRAVENOUS | Status: AC
  Administered 2019-05-17: 12:00:00 900 mg via INTRAVENOUS
  Filled 2019-05-17: qty 35

## 2019-05-17 MED ORDER — ATROPINE SULFATE 1 MG/ML IJ SOLN
0.5000 mg | Freq: Once | INTRAMUSCULAR | Status: AC | PRN
  Administered 2019-05-17: 0.5 mg via INTRAVENOUS
  Filled 2019-05-17: qty 1

## 2019-05-17 MED ORDER — POTASSIUM CHLORIDE CRYS ER 20 MEQ PO TBCR: 40.0000 meq | EXTENDED_RELEASE_TABLET | Freq: Once | ORAL | Status: AC

## 2019-05-17 MED ORDER — FLUOROURACIL CHEMO INJECTION 2.5 GM/50ML
320.0000 mg/m2 | Freq: Once | INTRAVENOUS | Status: AC
  Administered 2019-05-17: 14:00:00 700 mg via INTRAVENOUS
  Filled 2019-05-17: qty 14

## 2019-05-17 MED ORDER — SODIUM CHLORIDE 0.9 % IV SOLN
1920.0000 mg/m2 | INTRAVENOUS | Status: DC
  Administered 2019-05-17: 14:00:00 4300 mg via INTRAVENOUS
  Filled 2019-05-17: qty 86

## 2019-05-17 MED ORDER — POTASSIUM CHLORIDE CRYS ER 20 MEQ PO TBCR
40.0000 meq | EXTENDED_RELEASE_TABLET | Freq: Once | ORAL | Status: AC
  Administered 2019-05-17: 40 meq via ORAL

## 2019-05-17 MED ORDER — SODIUM CHLORIDE 0.9% FLUSH: 10.0000 mL | INTRAVENOUS | Status: DC | PRN

## 2019-05-17 MED ORDER — MAGNESIUM SULFATE 2 GM/50ML IV SOLN
2.0000 g | Freq: Once | INTRAVENOUS | Status: AC
  Administered 2019-05-17: 2 g via INTRAVENOUS
  Filled 2019-05-17: qty 50

## 2019-05-17 NOTE — Patient Instructions (Addendum)
[place] at [facility] Discharge Instructions  You were seen today by Dr. Delton Coombes. He went over your recent lab results. You will get Magnesium IV and Potassium by mouth today.  He will see you back in 2 weeks for labs, treatment and follow up.   Thank you for choosing [place] at [facility] to provide your oncology and hematology care.  To afford each patient quality time with our provider, please arrive at least 15 minutes before your scheduled appointment time.   If you have a lab appointment with the  please come in thru the  Main Entrance and check in at the main information desk  You need to re-schedule your appointment should you arrive 10 or more minutes late.  We strive to give you quality time with our providers, and arriving late affects you and other patients whose appointments are after yours.  Also, if you no show three or more times for appointments you may be dismissed from the clinic at the providers discretion.     Again, thank you for choosing [facility].  Our hope is that these requests will decrease the amount of time that you wait before being seen by our physicians.       _____________________________________________________________  Should you have questions after your visit to [facility], please contact our office at [phone] between the hours of 8:00 a.m. and 4:30 p.m.  Voicemails left after 4:00 p.m. will not be returned until the following business day.  For prescription refill requests, have your pharmacy contact our office and allow 72 hours.    Cancer Center Support Programs:   > Cancer Support Group  2nd Tuesday of the month 1pm-2pm, Journey Room

## 2019-05-17 NOTE — Patient Instructions (Signed)
[facility] Discharge Instructions for Patients Receiving Chemotherapy  Today you received the following chemotherapy agents   To help prevent nausea and vomiting after your treatment, we encourage you to take your nausea medication   If you develop nausea and vomiting that is not controlled by your nausea medication, call the clinic.   BELOW ARE SYMPTOMS THAT SHOULD BE REPORTED IMMEDIATELY:  *FEVER GREATER THAN 100.5 F  *CHILLS WITH OR WITHOUT FEVER  NAUSEA AND VOMITING THAT IS NOT CONTROLLED WITH YOUR NAUSEA MEDICATION  *UNUSUAL SHORTNESS OF BREATH  *UNUSUAL BRUISING OR BLEEDING  TENDERNESS IN MOUTH AND THROAT WITH OR WITHOUT PRESENCE OF ULCERS  *URINARY PROBLEMS  *BOWEL PROBLEMS  UNUSUAL RASH Items with * indicate a potential emergency and should be followed up as soon as possible.  Feel free to call the clinic should you have any questions or concerns. The clinic phone number is [phone].  Please show the CHEMO ALERT CARD at check-in to the Emergency Department and triage nurse.   

## 2019-05-17 NOTE — Progress Notes (Signed)
Patient has been assessed, vital signs and labs have been reviewed by Dr. Delton Coombes. ANC, Creatinine, LFTs, and Platelets are within treatment parameters per Dr. Delton Coombes. Magnesium and Potassium are low today.  Please give Magnesium 2 gram IV and Potassium 40 mEq PO today per Dr. Delton Coombes.  The patient is good to proceed with treatment at this time.

## 2019-05-17 NOTE — Progress Notes (Signed)
Labs reviewed with MD today . Will give potassium and magnesium per orders. Ok to treat today per MD.   Treatment given per orders. Patient tolerated it well without problems. Vitals stable and discharged home from clinic ambulatory. Follow up as scheduled.

## 2019-05-17 NOTE — Progress Notes (Signed)
Troy Smith, [place] [address]   CLINIC:  Medical Oncology/Hematology  PCP:  Troy Smith, [place] [address] [phone]   REASON FOR VISIT: Follow-up for sigmoid colon Smith mets to the liver  CURRENT THERAPY: FOLFOX.  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 Focal narrowing and wall thickening of the sigmoid colon is noted in this area most likely due to inflammation, but neoplasm cannot be excluded, and sigmoidoscopy is recommended for further evaluation.   03/20/2016 Procedure   Colonoscopy by Dr. Laural Smith- Malignant partially obstructing tumor in the distal sigmoid colon. Biopsied. - Incomplete exam.   03/23/2016 Pathology Results   Colon, biopsy, sigmoid mass - ADENOCARCINOMA.   03/23/2016 Procedure   Low anterior resection with transanal anastomosis   03/26/2016 Pathology Results   Colon, segmental resection for tumor, sigmoid - INVASIVE WELL DIFFERENTIATED ADENOCARCINOMA, SPANNING 5 CM IN GREATEST DIMENSION. - TUMOR INVADES THROUGH MUSCULARIS PROPRIA TO INVOLVE SUBSEROSAL SOFT TISSUES. - UNDERLYING MARKED ACUTE AND CHRONIC INFLAMMATION WITH GIANT CELL REACTION AND ABSCESS FORMATION EXTENDING TO THE SEROSAL SURFACE AND INVOLVING THE MESENTERIC TISSUE. - MARGINS ARE NEGATIVE FOR TUMOR. - FOURTEEN BENIGN LYMPH NODES WHICH DEMONSTRATE VARIABLE INVOLVEMENT WITH ACUTE AND CHRONIC INFLAMMATION WITH ASSOCIATED GIANT CELLS WITHOUT TUMOR IDENTIFIED (0/14).   04/08/2016 Smith Staging   Smith Staging Adenocarcinoma of sigmoid colon [facility]) Staging form: Colon and Rectum, AJCC 8th Edition - Pathologic stage from 04/01/2016: Stage IIA (pT3, pN0, cM0) - Signed by Troy Cancer, PA-C on 04/01/2016    07/09/2016 Imaging   CT abd/pelvis- No evidence of metastatic disease or  other acute findings.  Bilateral nonobstructing renal calculi and mild bilateral renal parenchymal scarring.  Stable mildly enlarged prostate gland and findings of chronic bladder outlet obstruction.   08/16/2018 -  Chemotherapy   The patient had palonosetron (ALOXI) injection 0.25 mg, 0.25 mg, Intravenous,  Once, 11 of 12 cycles Administration: 0.25 mg (08/16/2018), 0.25 mg (09/07/2018), 0.25 mg (10/19/2018), 0.25 mg (11/02/2018), 0.25 mg (11/16/2018), 0.25 mg (11/30/2018), 0.25 mg (03/21/2019), 0.25 mg (04/04/2019), 0.25 mg (04/18/2019), 0.25 mg (05/02/2019), 0.25 mg (05/17/2019) pegfilgrastim (NEULASTA) injection 6 mg, 6 mg, Subcutaneous, Once, 11 of 12 cycles Administration: 6 mg (08/18/2018), 6 mg (09/09/2018), 6 mg (10/21/2018), 6 mg (11/04/2018), 6 mg (11/18/2018), 6 mg (12/02/2018), 6 mg (03/23/2019), 6 mg (04/06/2019), 6 mg (04/20/2019), 6 mg (05/04/2019), 6 mg (05/19/2019) irinotecan (CAMPTOSAR) 380 mg in sodium chloride 0.9 % 500 mL chemo infusion, 165 mg/m2 = 380 mg, Intravenous,  Once, 1 of 1 cycle Administration: 380 mg (08/16/2018) leucovorin 900 mg in dextrose 5 % 250 mL infusion, 400 mg/m2 = 900 mg, Intravenous,  Once, 11 of 12 cycles Administration: 900 mg (08/16/2018), 900 mg (09/07/2018), 900 mg (10/19/2018), 900 mg (11/02/2018), 900 mg (11/16/2018), 800 mg (11/30/2018), 900 mg (03/21/2019), 900 mg (04/04/2019), 900 mg (04/18/2019), 900 mg (05/02/2019), 900 mg (05/17/2019) oxaliplatin (ELOXATIN) 190 mg in dextrose 5 % 500 mL chemo infusion, 85 mg/m2 = 190 mg, Intravenous,  Once, 11 of 12 cycles Dose modification: 68 mg/m2 (80 % of original dose 85 mg/m2, Cycle 2, Reason: Provider Judgment), 68 mg/m2 (80 % of original dose 85 mg/m2, Cycle 3, Reason: Provider Judgment) Administration: 190 mg (08/16/2018), 150 mg (09/07/2018), 150 mg (10/19/2018), 150 mg (11/16/2018), 150 mg (11/02/2018), 135 mg (11/30/2018), 150 mg (03/21/2019), 150 mg (04/04/2019), 150 mg (04/18/2019),  150 mg (05/02/2019), 150 mg (05/17/2019) fluorouracil (ADRUCIL) chemo  injection 700 mg, 320 mg/m2 = 700 mg (100 % of original dose 320 mg/m2), Intravenous,  Once, 8 of 9 cycles Dose modification: 320 mg/m2 (original dose 320 mg/m2, Cycle 4) Administration: 700 mg (11/02/2018), 700 mg (11/16/2018), 650 mg (11/30/2018), 700 mg (03/21/2019), 700 mg (04/04/2019), 700 mg (04/18/2019), 700 mg (05/02/2019), 700 mg (05/17/2019) fosaprepitant (EMEND) 150 mg, dexamethasone (DECADRON) 12 mg in sodium chloride 0.9 % 145 mL IVPB, , Intravenous,  Once, 11 of 12 cycles Administration:  (08/16/2018),  (09/07/2018),  (10/19/2018),  (11/02/2018),  (11/16/2018),  (11/30/2018),  (03/21/2019),  (04/04/2019),  (04/18/2019),  (05/02/2019),  (05/17/2019) fluorouracil (ADRUCIL) 5,400 mg in sodium chloride 0.9 % 142 mL chemo infusion, 2,400 mg/m2 = 5,400 mg, Intravenous, 1 Day/Dose, 11 of 12 cycles Dose modification: 1,920 mg/m2 (80 % of original dose 2,400 mg/m2, Cycle 3, Reason: Provider Judgment) Administration: 5,400 mg (08/16/2018), 5,400 mg (09/07/2018), 4,300 mg (10/19/2018), 4,300 mg (11/02/2018), 4,300 mg (11/16/2018), 3,850 mg (11/30/2018), 4,300 mg (03/21/2019), 4,300 mg (04/04/2019), 4,300 mg (04/18/2019), 4,300 mg (05/02/2019), 4,300 mg (05/17/2019) bevacizumab-awwb (MVASI) 500 mg in sodium chloride 0.9 % 100 mL chemo infusion, 5 mg/kg = 500 mg (100 % of original dose 5 mg/kg), Intravenous,  Once, 6 of 6 cycles Dose modification: 5 mg/kg (original dose 5 mg/kg, Cycle 1), 5 mg/kg (original dose 5 mg/kg, Cycle 2) Administration: 500 mg (08/16/2018), 500 mg (09/07/2018), 500 mg (10/19/2018), 500 mg (11/02/2018), 450 mg (11/16/2018), 400 mg (11/30/2018)  for chemotherapy treatment.       Smith STAGING: Smith Staging Adenocarcinoma of sigmoid colon [facility]) Staging form: Colon and Rectum, AJCC 8th Edition - Pathologic stage from 04/01/2016: Stage IIA (pT3, pN0, cM0) - Signed by Troy Cancer, PA-C on 04/01/2016    INTERVAL HISTORY:  Troy Smith 64 y.o. male seen for follow-up of metastatic sigmoid colon Smith to the liver  and toxicity assessment prior to cycle 5 of chemotherapy.  Weight loss of 4 pounds was noted.  He reports that he has been eating well.  He is taking Lomotil for diarrhea.  Appetite and energy levels are 75%.  Denies any worsening of numbness in the fingertips.  REVIEW OF SYSTEMS:  Review of Systems  Gastrointestinal: Positive for diarrhea.  Neurological: Positive for numbness.  All other systems reviewed and are negative.    PAST MEDICAL/SURGICAL HISTORY:  Past Medical History:  Diagnosis Date  . 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 Carpal tunnel syndrome 05/02/2014   Bilateral  . Cervical disc disorder with radiculopathy of cervical region 05/02/2014   Left C8  . Diabetes mellitus without complication ([place])   . Hypertension   . Port-A-Cath in place 08/10/2018   Past Surgical History:  Procedure Laterality Date  . BIOPSY  03/20/2016   Procedure: BIOPSY;  Surgeon: Rogene Houston, MD;  [facility];  Service: Endoscopy;;  colon  . BOWEL RESECTION N/A 03/23/2016   Procedure: LOW ANTERIOR RESECTION;  Surgeon: Aviva Signs, MD;  Location: [facility] OR[facility];  Service: General;  Laterality: N/A;  . COLONOSCOPY N/A 03/20/2016   Procedure: COLONOSCOPY;  Surgeon: Rogene Houston, MD;  Location: [facility];  Service: Endoscopy;  Laterality: N/A;  730  . COLONOSCOPY N/A 07/10/2016   Procedure: COLONOSCOPY;  Surgeon: Rogene Houston, MD;  Location: [facility];  Service: Endoscopy;  Laterality: N/A;  1040  . NO PAST SURGERIES    . POLYPECTOMY  07/10/2016   Procedure: POLYPECTOMY;  Surgeon:  Rogene Houston, MD;  Location[facility];  Service: Endoscopy;;  colon  . PORTACATH PLACEMENT Left 08/05/2018   Procedure: INSERTION PORT-A-CATH;  Surgeon: Aviva Signs, MD;  Location: [facility];  Service: General;  Laterality: Left;  . TONSILLECTOMY       SOCIAL HISTORY:  Social History   Socioeconomic History  . Marital status: Married    Spouse name: Not on file  . Number of  children: Not on file  . Years of education: Not on file  . Highest education level: Not on file  Occupational History  . Not on file  Tobacco Use  . Smoking status: Never Smoker  . Smokeless tobacco: Never Used  Substance and Sexual Activity  . Alcohol use: Yes    Comment: rarely  . Drug use: No  . Sexual activity: Not on file  Other Topics Concern  . Not on file  Social History Narrative  . Not on file   Social Determinants of Health   Financial Resource Strain:   . Difficulty of Paying Living Expenses:   Food Insecurity:   . Worried About Charity fundraiser in the Last Year:   . Arboriculturist in the Last Year:   Transportation Needs:   . Film/video editor (Medical):   Marland Kitchen Lack of Transportation (Non-Medical):   Physical Activity:   . Days of Exercise per Week:   . Minutes of Exercise per Session:   Stress:   . Feeling of Stress :   Social Connections:   . Frequency of Communication with Friends and Family:   . Frequency of Social Gatherings with Friends and Family:   . Attends Religious Services:   . Active Member of Clubs or Organizations:   . Attends Archivist Meetings:   Marland Kitchen Marital Status:   Intimate Partner Violence:   . Fear of Current or Ex-Partner:   . Emotionally Abused:   Marland Kitchen Physically Abused:   . Sexually Abused:     FAMILY HISTORY:  Family History  Problem Relation Age of Onset  . COPD Mother   . Melanoma Father     CURRENT MEDICATIONS:  Outpatient Encounter Medications as of 05/17/2019  Medication Sig  . loperamide (IMODIUM A-D) 2 MG tablet Take 2 at onset of diarrhea, then 1 after each watery bowel movement.  Marland Kitchen allopurinol (ZYLOPRIM) 300 MG tablet Take 1 tablet (300 mg total) by mouth daily.  Marland Kitchen amLODipine (NORVASC) 10 MG tablet Take 10 mg by mouth daily.  . Bevacizumab (AVASTIN IV) Inject into the vein every 14 (fourteen) days.  Marland Kitchen dextrose 5 % SOLN 1,000 mL with fluorouracil 5 GM/100ML SOLN Inject into the vein over 48 hr.  .  DIPHENHIST 12.5 MG/5ML liquid SWISH AND SPIT/SWALLOWOONE TEASPOONFUL BY MOUTH 4 TIMES DAILY AS NEEEDE FOR MOUTH PAIN.  . diphenoxylate-atropine (LOMOTIL) 2.5-0.025 MG tablet Take 1 tablet by mouth 4 (four) times daily as needed for diarrhea or loose stools.  . donepezil (ARICEPT) 10 MG tablet Take 1 tablet (10 mg total) by mouth at bedtime.  Marland Kitchen FLUOROURACIL IV Inject into the vein every 14 (fourteen) days.  Marland Kitchen HYDROcodone-acetaminophen (NORCO) 5-325 MG tablet Take 1 tablet by mouth every 4 (four) hours as needed for moderate pain.  Marland Kitchen insulin glargine (LANTUS) 100 UNIT/ML injection Inject 30 Units into the skin at bedtime.   . IRINOTECAN HCL IV Inject into the vein every 14 (fourteen) days.  Marland Kitchen LEUCOVORIN CALCIUM IV Inject into the vein every 14 (fourteen) days.  Marland Kitchen  lidocaine (XYLOCAINE) 2 % solution SMARTSIG:1 Teaspoon By Mouth 4 Times Daily PRN  . lidocaine-prilocaine (EMLA) cream Apply a small amount to port a cath site and cover with plastic wrap 1 hour prior to chemotherapy appointments  . magic mouthwash w/lidocaine SOLN Take 5 mLs by mouth 4 (four) times daily as needed for mouth pain.  . metoprolol succinate (TOPROL-XL) 50 MG 24 hr tablet Take 50 mg by mouth daily. Take with or immediately following a meal.  . MITIGARE 0.6 MG CAPS Take 1 capsule by mouth as needed.   . OXALIPLATIN IV Inject into the vein every 14 (fourteen) days.  . prochlorperazine (COMPAZINE) 10 MG tablet Take 1 tablet (10 mg total) by mouth every 6 (six) hours as needed (Nausea or vomiting).  . QC ANTACID/ANTI-GAS 200-200-20 MG/5ML suspension SMARTSIG:1 Teaspoon By Mouth 4 Times Daily PRN  . scopolamine (TRANSDERM-SCOP) 1 MG/3DAYS Place 1 patch (1.5 mg total) onto the skin every 3 (three) days.   No facility-administered encounter medications on file as of 05/17/2019.    ALLERGIES:  Allergies  Allergen Reactions  . Penicillin G Nausea And Vomiting     PHYSICAL EXAM:  ECOG Performance status: 1  Vital signs: Blood  pressure is 125/82.  Pulse rate is 79.  Respirate is 18.  Temperature 98.  Saturations 100.  Physical Exam Cardiovascular:     Rate and Rhythm: Normal rate and regular rhythm.     Heart sounds: Normal heart sounds.  Pulmonary:     Effort: Pulmonary effort is normal.     Breath sounds: Normal breath sounds.  Abdominal:     General: There is no distension.     Palpations: Abdomen is soft. There is no mass.  Musculoskeletal:        General: Normal range of motion.  Skin:    General: Skin is warm and dry.  Neurological:     Mental Status: He is alert and oriented to person, place, and time. Mental status is at baseline.  Psychiatric:        Mood and Affect: Mood normal.        Behavior: Behavior normal.        Thought Content: Thought content normal.        Judgment: Judgment normal.      LABORATORY DATA:  I have reviewed the labs as listed.  CBC    Component Value Date/Time   WBC 8.9 05/17/2019 0908   RBC 3.51 (L) 05/17/2019 0908   HGB 9.4 (L) 05/17/2019 0908   HCT 30.3 (L) 05/17/2019 0908   PLT 148 (L) 05/17/2019 0908   MCV 86.3 05/17/2019 0908   MCH 26.8 05/17/2019 0908   MCHC 31.0 05/17/2019 0908   RDW 19.7 (H) 05/17/2019 0908   LYMPHSABS 1.6 05/17/2019 0908   MONOABS 0.4 05/17/2019 0908   EOSABS 0.1 05/17/2019 0908   BASOSABS 0.0 05/17/2019 0908   CMP Latest Ref Rng & Units 05/17/2019 05/02/2019 04/18/2019  Glucose 70 - 99 mg/dL 158(H) 146(H) 156(H)  BUN 8 - 23 mg/dL '9 13 11  ' Creatinine 0.61 - 1.24 mg/dL 0.97 0.96 1.05  Sodium 135 - 145 mmol/L 139 140 140  Potassium 3.5 - 5.1 mmol/L 3.1(L) 3.3(L) 3.1(L)  Chloride 98 - 111 mmol/L 105 106 105  CO2 22 - 32 mmol/L '25 26 25  ' Calcium 8.9 - 10.3 mg/dL 9.1 9.1 9.1  Total Protein 6.5 - 8.1 g/dL 6.1(L) 6.1(L) 6.0(L)  Total Bilirubin 0.3 - 1.2 mg/dL 0.6 0.7 0.4  Alkaline Phos  38 - 126 U/L 88 88 81  AST 15 - 41 U/L 13(L) 12(L) 12(L)  ALT 0 - 44 U/L '10 9 8     ' I have reviewed scans.  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  Metastatic sigmoid colon Smith to the liver: -MS-stable, NRAS wild-type. -6 cycles of FOLFOX and bevacizumab preoperatively followed by a laparoscopic partial hepatectomy, redo low anterior resection on 02/01/2019. -Pathology showed YPT3Y PN 0.  Positive circumferential/radial/mesenteric margin. -3 months of FOLFOX followed by chemoradiation therapy recommended. -4 cycles of FOLFOX from 03/21/2019 through 05/02/2019. -I have reviewed his labs.  White count is 8.9 and hemoglobin is 9.4.  Platelet count is 148.  LFTs are normal. CEA was 3.0 on 04/18/2019. -He will proceed with cycle 5 of chemotherapy.  He will come back in 2 weeks for follow-up.  2.  Hypomagnesemia: -Magnesium is 1.6.  He will receive IV magnesium 2 g.  He will continue home magnesium.  3.  Hypokalemia: -Potassium is 3.1 today.  He will receive 40 mEq of potassium today.  4.  Diarrhea: -He has on and off diarrhea twice daily on average.  He is taking Lomotil 2 to 3 tablets/day which is helping.  5.  Peripheral neuropathy: -He has some numbness in the fingertips.  He is not dropping things.  His oxaliplatin is reduced by 20%.     Orders placed this encounter:  Orders Placed This Encounter  Procedures  . CBC with Differential/Platelet  . Comprehensive metabolic panel  . Lactate dehydrogenase  . Magnesium  . CEA  . Uric acid     Troy Jack, MD [place] [phone]

## 2019-05-18 LAB — CEA: CEA: 2.3 ng/mL (ref 0.0–4.7)

## 2019-05-19 ENCOUNTER — Encounter (HOSPITAL_COMMUNITY): Payer: Self-pay

## 2019-05-19 ENCOUNTER — Inpatient Hospital Stay (HOSPITAL_COMMUNITY): Payer: 59

## 2019-05-19 ENCOUNTER — Other Ambulatory Visit: Payer: Self-pay

## 2019-05-19 VITALS — BP 113/82 | HR 60 | Temp 97.7°F | Resp 18

## 2019-05-19 DIAGNOSIS — C187 Malignant neoplasm of sigmoid colon: Secondary | ICD-10-CM

## 2019-05-19 DIAGNOSIS — Z5111 Encounter for antineoplastic chemotherapy: Secondary | ICD-10-CM | POA: Diagnosis not present

## 2019-05-19 DIAGNOSIS — Z95828 Presence of other vascular implants and grafts: Secondary | ICD-10-CM

## 2019-05-19 MED ORDER — PEGFILGRASTIM INJECTION 6 MG/0.6ML ~~LOC~~
6.0000 mg | PREFILLED_SYRINGE | Freq: Once | SUBCUTANEOUS | Status: AC
  Administered 2019-05-19: 13:00:00 6 mg via SUBCUTANEOUS
  Filled 2019-05-19: qty 0.6

## 2019-05-19 MED ORDER — HEPARIN SOD (PORK) LOCK FLUSH 100 UNIT/ML IV SOLN
500.0000 [IU] | Freq: Once | INTRAVENOUS | Status: AC | PRN
  Administered 2019-05-19: 13:00:00 500 [IU]

## 2019-05-19 MED ORDER — SODIUM CHLORIDE 0.9% FLUSH
10.0000 mL | INTRAVENOUS | Status: DC | PRN
  Administered 2019-05-19: 10 mL

## 2019-05-19 NOTE — Progress Notes (Signed)
Patient for chemotherapy pump disconnect.  Patients port flushed without difficulty.  Good blood return noted with no bruising or swelling noted at site.  Band aid applied.  VSS with discharge and left ambulatory with no s/s of distress noted.  

## 2019-05-20 ENCOUNTER — Encounter (HOSPITAL_COMMUNITY): Payer: Self-pay | Admitting: Hematology

## 2019-05-20 NOTE — Assessment & Plan Note (Signed)
1.  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 -6 cycles of FOLFOX and bevacizumab preoperatively followed by a laparoscopic partial hepatectomy, redo low anterior resection on 02/01/2019. -Pathology showed YPT3Y PN 0.  Positive circumferential/radial/mesenteric margin. -3 months of FOLFOX followed by chemoradiation therapy recommended. -4 cycles of FOLFOX from 03/21/2019 through 05/02/2019. -I have reviewed his labs.  White count is 8.9 and hemoglobin is 9.4.  Platelet count is 148.  LFTs are normal. CEA was 3.0 on 04/18/2019. -He will proceed with cycle 5 of chemotherapy.  He will come back in 2 weeks for follow-up.  2.  Hypomagnesemia: -Magnesium is 1.6.  He will receive IV magnesium 2 g.  He will continue home magnesium.  3.  Hypokalemia: -Potassium is 3.1 today.  He will receive 40 mEq of potassium today.  4.  Diarrhea: -He has on and off diarrhea twice daily on average.  He is taking Lomotil 2 to 3 tablets/day which is helping.  5.  Peripheral neuropathy: -He has some numbness in the fingertips.  He is not dropping things.  His oxaliplatin is reduced by 20%.

## 2019-05-25 NOTE — Progress Notes (Signed)

## 2019-05-31 ENCOUNTER — Encounter (HOSPITAL_COMMUNITY): Payer: Self-pay

## 2019-05-31 ENCOUNTER — Inpatient Hospital Stay (HOSPITAL_BASED_OUTPATIENT_CLINIC_OR_DEPARTMENT_OTHER): Payer: 59 | Admitting: Hematology

## 2019-05-31 ENCOUNTER — Inpatient Hospital Stay (HOSPITAL_COMMUNITY): Payer: 59

## 2019-05-31 ENCOUNTER — Other Ambulatory Visit: Payer: Self-pay

## 2019-05-31 VITALS — BP 112/71 | HR 65 | Temp 97.3°F | Resp 18 | Wt 175.5 lb

## 2019-05-31 VITALS — BP 113/86 | HR 59 | Temp 97.3°F | Resp 18

## 2019-05-31 DIAGNOSIS — C187 Malignant neoplasm of sigmoid colon: Secondary | ICD-10-CM

## 2019-05-31 DIAGNOSIS — Z95828 Presence of other vascular implants and grafts: Secondary | ICD-10-CM

## 2019-05-31 DIAGNOSIS — Z5111 Encounter for antineoplastic chemotherapy: Secondary | ICD-10-CM | POA: Diagnosis not present

## 2019-05-31 LAB — LACTATE DEHYDROGENASE: LDH: 115 U/L (ref 98–192)

## 2019-05-31 LAB — COMPREHENSIVE METABOLIC PANEL
ALT: 8 U/L (ref 0–44)
AST: 11 U/L — ABNORMAL LOW (ref 15–41)
Albumin: 3.3 g/dL — ABNORMAL LOW (ref 3.5–5.0)
Alkaline Phosphatase: 87 U/L (ref 38–126)
Anion gap: 9 (ref 5–15)
BUN: 10 mg/dL (ref 8–23)
CO2: 25 mmol/L (ref 22–32)
Calcium: 9.1 mg/dL (ref 8.9–10.3)
Chloride: 105 mmol/L (ref 98–111)
Creatinine, Ser: 0.94 mg/dL (ref 0.61–1.24)
GFR calc Af Amer: >60 mL/min (ref >60)
GFR calc non Af Amer: >60 mL/min (ref >60)
Glucose, Bld: 137 mg/dL — ABNORMAL HIGH (ref 70–99)
Potassium: 3.5 mmol/L (ref 3.5–5.1)
Sodium: 139 mmol/L (ref 135–145)
Total Bilirubin: 0.5 mg/dL (ref 0.3–1.2)
Total Protein: 6.1 g/dL — ABNORMAL LOW (ref 6.5–8.1)

## 2019-05-31 LAB — MAGNESIUM: Magnesium: 1.8 mg/dL (ref 1.7–2.4)

## 2019-05-31 LAB — URIC ACID: Uric Acid, Serum: 3.5 mg/dL — ABNORMAL LOW (ref 3.7–8.6)

## 2019-05-31 LAB — CBC WITH DIFFERENTIAL/PLATELET
Abs Immature Granulocytes: 0.05 10*3/uL (ref 0.00–0.07)
Basophils Absolute: 0 10*3/uL (ref 0.0–0.1)
Basophils Relative: 0 %
Eosinophils Absolute: 0.1 10*3/uL (ref 0.0–0.5)
Eosinophils Relative: 1 %
HCT: 29.1 % — ABNORMAL LOW (ref 39.0–52.0)
Hemoglobin: 8.8 g/dL — ABNORMAL LOW (ref 13.0–17.0)
Immature Granulocytes: 1 %
Lymphocytes Relative: 27 %
Lymphs Abs: 1.6 10*3/uL (ref 0.7–4.0)
MCH: 26.4 pg (ref 26.0–34.0)
MCHC: 30.2 g/dL (ref 30.0–36.0)
MCV: 87.4 fL (ref 80.0–100.0)
Monocytes Absolute: 0.3 10*3/uL (ref 0.1–1.0)
Monocytes Relative: 4 %
Neutro Abs: 4.1 10*3/uL (ref 1.7–7.7)
Neutrophils Relative %: 67 %
Platelets: 141 10*3/uL — ABNORMAL LOW (ref 150–400)
RBC: 3.33 MIL/uL — ABNORMAL LOW (ref 4.22–5.81)
RDW: 19.5 % — ABNORMAL HIGH (ref 11.5–15.5)
WBC: 6.2 10*3/uL (ref 4.0–10.5)
nRBC: 0 % (ref 0.0–0.2)

## 2019-05-31 MED ORDER — FLUOROURACIL CHEMO INJECTION 2.5 GM/50ML
320.0000 mg/m2 | Freq: Once | INTRAVENOUS | Status: AC
  Administered 2019-05-31: 14:00:00 700 mg via INTRAVENOUS
  Filled 2019-05-31: qty 14

## 2019-05-31 MED ORDER — LEUCOVORIN CALCIUM INJECTION 350 MG
800.0000 mg | Freq: Once | INTRAVENOUS | Status: AC
  Administered 2019-05-31: 11:00:00 800 mg via INTRAVENOUS
  Filled 2019-05-31: qty 5

## 2019-05-31 MED ORDER — DEXTROSE 5 % IV SOLN: Freq: Once | INTRAVENOUS | Status: AC

## 2019-05-31 MED ORDER — SODIUM CHLORIDE 0.9 % IV SOLN
10.0000 mg | Freq: Once | INTRAVENOUS | Status: AC
  Administered 2019-05-31: 10:00:00 10 mg via INTRAVENOUS
  Filled 2019-05-31: qty 10

## 2019-05-31 MED ORDER — SODIUM CHLORIDE 0.9 % IV SOLN
Freq: Once | INTRAVENOUS | Status: AC
  Filled 2019-05-31: qty 150

## 2019-05-31 MED ORDER — PALONOSETRON HCL INJECTION 0.25 MG/5ML
0.2500 mg | Freq: Once | INTRAVENOUS | Status: AC
  Administered 2019-05-31: 0.25 mg via INTRAVENOUS
  Filled 2019-05-31: qty 5

## 2019-05-31 MED ORDER — OXALIPLATIN CHEMO INJECTION 100 MG/20ML
130.0000 mg | Freq: Once | INTRAVENOUS | Status: AC
  Administered 2019-05-31: 11:00:00 130 mg via INTRAVENOUS
  Filled 2019-05-31: qty 20

## 2019-05-31 MED ORDER — SODIUM CHLORIDE 0.9 % IV SOLN
3850.0000 mg | INTRAVENOUS | Status: DC
  Administered 2019-05-31: 14:00:00 3850 mg via INTRAVENOUS
  Filled 2019-05-31: qty 77

## 2019-05-31 NOTE — Progress Notes (Signed)
Patient has been assessed, vital signs and labs have been reviewed by Dr. Katragadda. ANC, Creatinine, LFTs, and Platelets are within treatment parameters per Dr. Katragadda. The patient is good to proceed with treatment at this time.  

## 2019-05-31 NOTE — Patient Instructions (Addendum)
[place] at [facility] Discharge Instructions  You were seen today by Dr. Delton Coombes. He went over your recent lab results. He will schedule you for a repeat scan prior to your next visit. He discussed your options past current treatment as well as radiation. He will refer you to radiation in [facility] for radiation therapy. He will see you back in 2 weeks for labs and follow up.   Thank you for choosing [place] at [facility] to provide your oncology and hematology care.  To afford each patient quality time with our provider, please arrive at least 15 minutes before your scheduled appointment time.   If you have a lab appointment with the [facility] please come in thru the  Main Entrance and check in at the main information desk  You need to re-schedule your appointment should you arrive 10 or more minutes late.  We strive to give you quality time with our providers, and arriving late affects you and other patients whose appointments are after yours.  Also, if you no show three or more times for appointments you may be dismissed from the clinic at the providers discretion.     Again, thank you for choosing [facility].  Our hope is that these requests will decrease the amount of time that you wait before being seen by our physicians.       _____________________________________________________________  Should you have questions after your visit to [facility], please contact our office at [phone] between the hours of 8:00 a.m. and 4:30 p.m.  Voicemails left after 4:00 p.m. will not be returned until the following business day.  For prescription refill requests, have your pharmacy contact our office and allow 72 hours.    Cancer Center Support Programs:   > Cancer Support Group  2nd Tuesday of the month 1pm-2pm, Journey Room

## 2019-05-31 NOTE — Progress Notes (Signed)
05/31/19  Adjust doses of chemotherapy with new BSA 101m2 due to weight loss >10%  T.O. Dr Rhys Martini, PharmD

## 2019-05-31 NOTE — Progress Notes (Signed)
Patient presents today for treatment and follow up visit with Dr. Delton Coombes. Labs pending. Patient denies any significant changes since his last visit. Patient's wife at the bedside states he had episodes of diarrhea the week after treatment. Patient used Lomotil and the diarrhea resolved but patient lost weight since the last visit. Patient states he had an issue with gout after treatment. Vital signs within parameters for treatment today.   Labs within parameters for treatment today.   Message received from [facility] LPN/ Dr. Delton Coombes. Proceed with treatment.    Treatment given today per MD orders. Tolerated infusion without adverse affects. Vital signs stable. No complaints at this time. 5FU pump infusing per protocol. RUN noted on the screen and verified with the patient.  Discharged from clinic ambulatory. F/U with [facility] as scheduled.

## 2019-05-31 NOTE — Patient Instructions (Signed)
[facility] Discharge Instructions for Patients Receiving Chemotherapy  Today you received the following chemotherapy agents   To help prevent nausea and vomiting after your treatment, we encourage you to take your nausea medication   If you develop nausea and vomiting that is not controlled by your nausea medication, call the clinic.   BELOW ARE SYMPTOMS THAT SHOULD BE REPORTED IMMEDIATELY:  *FEVER GREATER THAN 100.5 F  *CHILLS WITH OR WITHOUT FEVER  NAUSEA AND VOMITING THAT IS NOT CONTROLLED WITH YOUR NAUSEA MEDICATION  *UNUSUAL SHORTNESS OF BREATH  *UNUSUAL BRUISING OR BLEEDING  TENDERNESS IN MOUTH AND THROAT WITH OR WITHOUT PRESENCE OF ULCERS  *URINARY PROBLEMS  *BOWEL PROBLEMS  UNUSUAL RASH Items with * indicate a potential emergency and should be followed up as soon as possible.  Feel free to call the clinic should you have any questions or concerns. The clinic phone number is [phone].  Please show the CHEMO ALERT CARD at check-in to the Emergency Department and triage nurse.   

## 2019-05-31 NOTE — Progress Notes (Signed)
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 Focal narrowing and wall thickening of the sigmoid colon is noted in this area most likely due to inflammation, but neoplasm cannot be excluded, and sigmoidoscopy is recommended for further evaluation.   03/20/2016 Procedure   Colonoscopy by Dr. Laural Golden- Malignant partially obstructing tumor in the distal sigmoid colon. Biopsied. - Incomplete exam.   03/23/2016 Pathology Results   Colon, biopsy, sigmoid mass - ADENOCARCINOMA.   03/23/2016 Procedure   Low anterior resection with transanal anastomosis   03/26/2016 Pathology Results   Colon, segmental resection for tumor, sigmoid - INVASIVE WELL DIFFERENTIATED ADENOCARCINOMA, SPANNING 5 CM IN GREATEST DIMENSION. - TUMOR INVADES THROUGH MUSCULARIS PROPRIA TO INVOLVE SUBSEROSAL SOFT TISSUES. - UNDERLYING MARKED ACUTE AND CHRONIC INFLAMMATION WITH GIANT CELL REACTION AND ABSCESS FORMATION EXTENDING TO THE SEROSAL SURFACE AND INVOLVING THE MESENTERIC TISSUE. - MARGINS ARE NEGATIVE FOR TUMOR. - FOURTEEN BENIGN LYMPH NODES WHICH DEMONSTRATE VARIABLE INVOLVEMENT WITH ACUTE AND CHRONIC INFLAMMATION WITH ASSOCIATED GIANT CELLS WITHOUT TUMOR IDENTIFIED (0/14).   04/08/2016 Cancer Staging   Cancer Staging Adenocarcinoma of sigmoid colon [facility]) Staging form: Colon and Rectum, AJCC 8th Edition - Pathologic stage from 04/01/2016: Stage IIA (pT3, pN0, cM0) - Signed by Baird Cancer, PA-C on 04/01/2016    07/09/2016 Imaging   CT abd/pelvis- No evidence of metastatic disease or  other acute findings.  Bilateral nonobstructing renal calculi and mild bilateral renal parenchymal scarring.  Stable mildly enlarged prostate gland and findings of chronic bladder outlet obstruction.   08/16/2018 -  Chemotherapy   The patient had palonosetron (ALOXI) injection 0.25 mg, 0.25 mg, Intravenous,  Once, 12 of 12 cycles Administration: 0.25 mg (08/16/2018), 0.25 mg (09/07/2018), 0.25 mg (10/19/2018), 0.25 mg (11/02/2018), 0.25 mg (11/16/2018), 0.25 mg (11/30/2018), 0.25 mg (03/21/2019), 0.25 mg (04/04/2019), 0.25 mg (04/18/2019), 0.25 mg (05/02/2019), 0.25 mg (05/17/2019), 0.25 mg (05/31/2019) pegfilgrastim (NEULASTA) injection 6 mg, 6 mg, Subcutaneous, Once, 12 of 12 cycles Administration: 6 mg (08/18/2018), 6 mg (09/09/2018), 6 mg (10/21/2018), 6 mg (11/04/2018), 6 mg (11/18/2018), 6 mg (12/02/2018), 6 mg (03/23/2019), 6 mg (04/06/2019), 6 mg (04/20/2019), 6 mg (05/04/2019), 6 mg (05/19/2019) irinotecan (CAMPTOSAR) 380 mg in sodium chloride 0.9 % 500 mL chemo infusion, 165 mg/m2 = 380 mg, Intravenous,  Once, 1 of 1 cycle Administration: 380 mg (08/16/2018) leucovorin 900 mg in dextrose 5 % 250 mL infusion, 400 mg/m2 = 900 mg, Intravenous,  Once, 12 of 12 cycles Administration: 900 mg (08/16/2018), 900 mg (09/07/2018), 900 mg (10/19/2018), 900 mg (11/02/2018), 900 mg (11/16/2018), 800 mg (11/30/2018), 900 mg (03/21/2019), 900 mg (04/04/2019), 900 mg (04/18/2019), 900 mg (05/02/2019), 900 mg (05/17/2019), 800 mg (05/31/2019) oxaliplatin (ELOXATIN) 190 mg in dextrose 5 % 500 mL chemo infusion, 85 mg/m2 = 190 mg, Intravenous,  Once, 12 of 12 cycles Dose modification: 68 mg/m2 (80 % of original dose 85 mg/m2, Cycle 2, Reason: Provider Judgment), 68 mg/m2 (80 % of original dose 85 mg/m2, Cycle 3, Reason: Provider Judgment) Administration: 190 mg (08/16/2018), 150 mg (09/07/2018), 150 mg (10/19/2018), 150 mg (11/16/2018), 150 mg (11/02/2018), 135 mg (11/30/2018), 150 mg (03/21/2019),  150 mg (04/04/2019), 150 mg (04/18/2019), 150 mg (05/02/2019), 150 mg  (05/17/2019), 130 mg (05/31/2019) fluorouracil (ADRUCIL) chemo injection 700 mg, 320 mg/m2 = 700 mg (100 % of original dose 320 mg/m2), Intravenous,  Once, 9 of 9 cycles Dose modification: 320 mg/m2 (original dose 320 mg/m2, Cycle 4) Administration: 700 mg (11/02/2018), 700 mg (11/16/2018), 650 mg (11/30/2018), 700 mg (03/21/2019), 700 mg (04/04/2019), 700 mg (04/18/2019), 700 mg (05/02/2019), 700 mg (05/17/2019), 700 mg (05/31/2019) fosaprepitant (EMEND) 150 mg, dexamethasone (DECADRON) 12 mg in sodium chloride 0.9 % 145 mL IVPB, , Intravenous,  Once, 12 of 12 cycles Administration:  (08/16/2018),  (09/07/2018),  (10/19/2018),  (11/02/2018),  (11/16/2018),  (11/30/2018),  (03/21/2019),  (04/04/2019),  (04/18/2019),  (05/02/2019),  (05/17/2019),  (05/31/2019) fluorouracil (ADRUCIL) 5,400 mg in sodium chloride 0.9 % 142 mL chemo infusion, 2,400 mg/m2 = 5,400 mg, Intravenous, 1 Day/Dose, 12 of 12 cycles Dose modification: 1,920 mg/m2 (80 % of original dose 2,400 mg/m2, Cycle 3, Reason: Provider Judgment) Administration: 5,400 mg (08/16/2018), 5,400 mg (09/07/2018), 4,300 mg (10/19/2018), 4,300 mg (11/02/2018), 4,300 mg (11/16/2018), 3,850 mg (11/30/2018), 4,300 mg (03/21/2019), 4,300 mg (04/04/2019), 4,300 mg (04/18/2019), 4,300 mg (05/02/2019), 4,300 mg (05/17/2019), 3,850 mg (05/31/2019) bevacizumab-awwb (MVASI) 500 mg in sodium chloride 0.9 % 100 mL chemo infusion, 5 mg/kg = 500 mg (100 % of original dose 5 mg/kg), Intravenous,  Once, 6 of 6 cycles Dose modification: 5 mg/kg (original dose 5 mg/kg, Cycle 1), 5 mg/kg (original dose 5 mg/kg, Cycle 2) Administration: 500 mg (08/16/2018), 500 mg (09/07/2018), 500 mg (10/19/2018), 500 mg (11/02/2018), 450 mg (11/16/2018), 400 mg (11/30/2018)  for chemotherapy treatment.       CANCER STAGING: Cancer Staging Adenocarcinoma of sigmoid colon [facility]) Staging form: Colon and Rectum, AJCC 8th Edition - Pathologic stage from 04/01/2016: Stage IIA (pT3, pN0, cM0) - Signed by Baird Cancer, PA-C on  04/01/2016    INTERVAL HISTORY:  Mr. Barrick 64 y.o. male seen for follow-up of metastatic sigmoid colon cancer to the liver and tox adjustment prior to cycle 6 of chemotherapy.  He reportedly had knee pains from gout last week.  They resolved today.  Had intermittent diarrhea from last chemotherapy.  Appetite is 50%.  Energy levels are 75%.  Numbness in the hands has been stable.  REVIEW OF SYSTEMS:  Review of Systems  Gastrointestinal: Positive for diarrhea.  Neurological: Positive for numbness.  All other systems reviewed and are negative.    PAST MEDICAL/SURGICAL HISTORY:  Past Medical History:  Diagnosis Date  . 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 Carpal tunnel syndrome 05/02/2014   Bilateral  . Cervical disc disorder with radiculopathy of cervical region 05/02/2014   Left C8  . Diabetes mellitus without complication ([place])   . Hypertension   . Port-A-Cath in place 08/10/2018   Past Surgical History:  Procedure Laterality Date  . BIOPSY  03/20/2016   Procedure: BIOPSY;  Surgeon: Rogene Houston, MD;  Location: [facility] END[facility];  Service: Endoscopy;;  colon  . BOWEL RESECTION N/A 03/23/2016   Procedure: LOW ANTERIOR RESECTION;  Surgeon: Aviva Signs, MD;  Location: [facility] ORS;  Service: General;  Laterality: N/A;  . COLONOSCOPY N/A 03/20/2016   Procedure: COLONOSCOPY;  Surgeon: Rogene Houston, MD;  Location: [facility] ENDO SUITE;  Service: Endoscopy;  Laterality: N/A;  730  . COLONOSCOPY N/A 07/10/2016   Procedure: COLONOSCOPY;  Surgeon: Rogene Houston, MD;  Location: [facility] ENDO SUITE;  Service: Endoscopy;  Laterality: N/A;  1040  . NO  PAST SURGERIES    . POLYPECTOMY  07/10/2016   Procedure: POLYPECTOMY;  Surgeon: Rogene Houston, MD;  Location: [facility];  Service: Endoscopy;;  colon  . PORTACATH PLACEMENT Left 08/05/2018   Procedure: INSERTION PORT-A-CATH;  Surgeon: Aviva Signs, MD;  Location: [facility] ORS;  Service: General;  Laterality: Left;  . TONSILLECTOMY       SOCIAL  HISTORY:  Social History   Socioeconomic History  . Marital status: Married    Spouse name: Not on file  . Number of children: Not on file  . Years of education: Not on file  . Highest education level: Not on file  Occupational History  . Not on file  Tobacco Use  . Smoking status: Never Smoker  . Smokeless tobacco: Never Used  Substance and Sexual Activity  . Alcohol use: Yes    Comment: rarely  . Drug use: No  . Sexual activity: Not on file  Other Topics Concern  . Not on file  Social History Narrative  . Not on file   Social Determinants of Health   Financial Resource Strain:   . Difficulty of Paying Living Expenses:   Food Insecurity:   . Worried About Charity fundraiser in the Last Year:   . Arboriculturist in the Last Year:   Transportation Needs:   . Film/video editor (Medical):   Marland Kitchen Lack of Transportation (Non-Medical):   Physical Activity:   . Days of Exercise per Week:   . Minutes of Exercise per Session:   Stress:   . Feeling of Stress :   Social Connections:   . Frequency of Communication with Friends and Family:   . Frequency of Social Gatherings with Friends and Family:   . Attends Religious Services:   . Active Member of Clubs or Organizations:   . Attends Archivist Meetings:   Marland Kitchen Marital Status:   Intimate Partner Violence:   . Fear of Current or Ex-Partner:   . Emotionally Abused:   Marland Kitchen Physically Abused:   . Sexually Abused:     FAMILY HISTORY:  Family History  Problem Relation Age of Onset  . COPD Mother   . Melanoma Father     CURRENT MEDICATIONS:  Outpatient Encounter Medications as of 05/31/2019  Medication Sig  . allopurinol (ZYLOPRIM) 300 MG tablet Take 1 tablet (300 mg total) by mouth daily.  Marland Kitchen amLODipine (NORVASC) 10 MG tablet Take 10 mg by mouth daily.  . Bevacizumab (AVASTIN IV) Inject into the vein every 14 (fourteen) days.  Marland Kitchen dextrose 5 % SOLN 1,000 mL with fluorouracil 5 GM/100ML SOLN Inject into the vein  over 48 hr.  . DIPHENHIST 12.5 MG/5ML liquid SWISH AND SPIT/SWALLOWOONE TEASPOONFUL BY MOUTH 4 TIMES DAILY AS NEEEDE FOR MOUTH PAIN.  . diphenoxylate-atropine (LOMOTIL) 2.5-0.025 MG tablet Take 1 tablet by mouth 4 (four) times daily as needed for diarrhea or loose stools.  . donepezil (ARICEPT) 10 MG tablet Take 1 tablet (10 mg total) by mouth at bedtime.  Marland Kitchen FLUOROURACIL IV Inject into the vein every 14 (fourteen) days.  Marland Kitchen HYDROcodone-acetaminophen (NORCO) 5-325 MG tablet Take 1 tablet by mouth every 4 (four) hours as needed for moderate pain.  Marland Kitchen insulin glargine (LANTUS) 100 UNIT/ML injection Inject 30 Units into the skin at bedtime.   . IRINOTECAN HCL IV Inject into the vein every 14 (fourteen) days.  Marland Kitchen LEUCOVORIN CALCIUM IV Inject into the vein every 14 (fourteen) days.  Marland Kitchen lidocaine (XYLOCAINE) 2 %  solution SMARTSIG:1 Teaspoon By Mouth 4 Times Daily PRN  . lidocaine-prilocaine (EMLA) cream Apply a small amount to port a cath site and cover with plastic wrap 1 hour prior to chemotherapy appointments  . loperamide (IMODIUM A-D) 2 MG tablet Take 2 at onset of diarrhea, then 1 after each watery bowel movement.  . magic mouthwash w/lidocaine SOLN Take 5 mLs by mouth 4 (four) times daily as needed for mouth pain.  . metoprolol succinate (TOPROL-XL) 50 MG 24 hr tablet Take 50 mg by mouth daily. Take with or immediately following a meal.  . MITIGARE 0.6 MG CAPS Take 1 capsule by mouth as needed.   . OXALIPLATIN IV Inject into the vein every 14 (fourteen) days.  . prochlorperazine (COMPAZINE) 10 MG tablet Take 1 tablet (10 mg total) by mouth every 6 (six) hours as needed (Nausea or vomiting).  . QC ANTACID/ANTI-GAS 200-200-20 MG/5ML suspension SMARTSIG:1 Teaspoon By Mouth 4 Times Daily PRN  . scopolamine (TRANSDERM-SCOP) 1 MG/3DAYS Place 1 patch (1.5 mg total) onto the skin every 3 (three) days.   No facility-administered encounter medications on file as of 05/31/2019.    ALLERGIES:  Allergies   Allergen Reactions  . Penicillin G Nausea And Vomiting     PHYSICAL EXAM:  ECOG Performance status: 1  Vital signs: Blood pressure is 125/82.  Pulse rate is 79.  Respirate is 18.  Temperature 98.  Saturations 100.  Physical Exam Cardiovascular:     Rate and Rhythm: Normal rate and regular rhythm.     Heart sounds: Normal heart sounds.  Pulmonary:     Effort: Pulmonary effort is normal.     Breath sounds: Normal breath sounds.  Abdominal:     General: There is no distension.     Palpations: Abdomen is soft. There is no mass.  Musculoskeletal:        General: Normal range of motion.  Skin:    General: Skin is warm and dry.  Neurological:     Mental Status: He is alert and oriented to person, place, and time. Mental status is at baseline.  Psychiatric:        Mood and Affect: Mood normal.        Behavior: Behavior normal.        Thought Content: Thought content normal.        Judgment: Judgment normal.      LABORATORY DATA:  I have reviewed the labs as listed.  CBC    Component Value Date/Time   WBC 6.2 05/31/2019 0819   RBC 3.33 (L) 05/31/2019 0819   HGB 8.8 (L) 05/31/2019 0819   HCT 29.1 (L) 05/31/2019 0819   PLT 141 (L) 05/31/2019 0819   MCV 87.4 05/31/2019 0819   MCH 26.4 05/31/2019 0819   MCHC 30.2 05/31/2019 0819   RDW 19.5 (H) 05/31/2019 0819   LYMPHSABS 1.6 05/31/2019 0819   MONOABS 0.3 05/31/2019 0819   EOSABS 0.1 05/31/2019 0819   BASOSABS 0.0 05/31/2019 0819   CMP Latest Ref Rng & Units 05/31/2019 05/17/2019 05/02/2019  Glucose 70 - 99 mg/dL 137(H) 158(H) 146(H)  BUN 8 - 23 mg/dL _0 Creatinine 0.61 - 1.24 mg/dL 0.94 0.97 0.96  Sodium 135 - 145 mmol/L 139 139 140  Potassium 3.5 - 5.1 mmol/L 3.5 3.1(L) 3.3(L)  Chloride 98 - 111 mmol/L 105 105 106  CO2 22 - 32 mmol/L _1 Calcium 8.9 - 10.3 mg/dL 9.1 9.1 9.1  Total Protein 6.5 - 8.1  g/dL 6.1(L) 6.1(L) 6.1(L)  Total Bilirubin 0.3 - 1.2 mg/dL 0.5 0.6 0.7  Alkaline Phos 38 - 126 U/L 87 88  88  AST 15 - 41 U/L 11(L) 13(L) 12(L)  ALT 0 - 44 U/L _0 I have reviewed his scans.  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  Metastatic sigmoid colon cancer to the liver: -MS-stable, NRAS wild-type -5 cycles of FOLFOX from 03/21/2019 through 05/17/2019 in the postoperative setting. -Last CEA was 3.0 on 04/18/2019.  He has tolerated last cycle reasonably well.  He had some diarrhea. -I reviewed his labs.  Potassium and magnesium is within normal limits.  LFTs are normal.  He will proceed with cycle 6 today with 20% dose reduction. -We will order CT CAP prior to next visit in 2 weeks.  We will refer him to radiation therapy in [place] at that time.  I have discussed options including infusional 5-FU or Xeloda.  2.  Hypomagnesemia: -Magnesium today is 1.8.  He will continue home magnesium.  3.  Hypokalemia: -Potassium today is normal.  He does not need any oral potassium today.  4.  Diarrhea: -He has on and off diarrhea and takes Lomotil 2 to 3 tablets/day which is helping.  5.  Peripheral neuropathy: -Numbness in the fingertips stable.  Oxaliplatin is dose reduced by 20%.     Orders placed this encounter:  Orders Placed This Encounter  Procedures  . CT Abdomen Pelvis W Contrast  . CT Chest W Contrast  . CBC with Differential/Platelet  . Comprehensive metabolic panel  . Magnesium  . Uric acid     Derek Jack, MD [place] [phone]

## 2019-05-31 NOTE — Assessment & Plan Note (Signed)
1.  Metastatic sigmoid colon cancer to the liver: -MS-stable, NRAS wild-type -5 cycles of FOLFOX from 03/21/2019 through 05/17/2019 in the postoperative setting. -Last CEA was 3.0 on 04/18/2019.  He has tolerated last cycle reasonably well.  He had some diarrhea. -I reviewed his labs.  Potassium and magnesium is within normal limits.  LFTs are normal.  He will proceed with cycle 6 today with 20% dose reduction. -We will order CT CAP prior to next visit in 2 weeks.  We will refer him to radiation therapy in [place] at that time.  I have discussed options including infusional 5-FU or Xeloda.  2.  Hypomagnesemia: -Magnesium today is 1.8.  He will continue home magnesium.  3.  Hypokalemia: -Potassium today is normal.  He does not need any oral potassium today.  4.  Diarrhea: -He has on and off diarrhea and takes Lomotil 2 to 3 tablets/day which is helping.  5.  Peripheral neuropathy: -Numbness in the fingertips stable.  Oxaliplatin is dose reduced by 20%.

## 2019-06-01 LAB — CEA: CEA: 1.9 ng/mL (ref 0.0–4.7)

## 2019-06-02 ENCOUNTER — Other Ambulatory Visit: Payer: Self-pay

## 2019-06-02 ENCOUNTER — Inpatient Hospital Stay (HOSPITAL_COMMUNITY): Payer: 59

## 2019-06-02 VITALS — BP 123/70 | HR 60 | Temp 97.6°F | Resp 18

## 2019-06-02 DIAGNOSIS — Z5111 Encounter for antineoplastic chemotherapy: Secondary | ICD-10-CM | POA: Diagnosis not present

## 2019-06-02 DIAGNOSIS — Z95828 Presence of other vascular implants and grafts: Secondary | ICD-10-CM

## 2019-06-02 DIAGNOSIS — C187 Malignant neoplasm of sigmoid colon: Secondary | ICD-10-CM

## 2019-06-02 MED ORDER — SODIUM CHLORIDE 0.9% FLUSH
10.0000 mL | INTRAVENOUS | Status: DC | PRN
  Administered 2019-06-02: 10 mL

## 2019-06-02 MED ORDER — HEPARIN SOD (PORK) LOCK FLUSH 100 UNIT/ML IV SOLN
500.0000 [IU] | Freq: Once | INTRAVENOUS | Status: AC | PRN
  Administered 2019-06-02: 500 [IU]

## 2019-06-02 MED ORDER — PEGFILGRASTIM INJECTION 6 MG/0.6ML ~~LOC~~
6.0000 mg | PREFILLED_SYRINGE | Freq: Once | SUBCUTANEOUS | Status: AC
  Administered 2019-06-02: 6 mg via SUBCUTANEOUS
  Filled 2019-06-02: qty 0.6

## 2019-06-02 NOTE — Progress Notes (Signed)
Patient in for chemo pump disconnect.  Patients port flushed without difficulty.  Good blood return noted with no bruising or swelling noted at site.  Band aid applied.  Patient tolerated injection with no complaints voiced.  Site clean and dry with no bruising or swelling noted at site.  Band aid applied.  Vss with discharge and left ambulatory with no s/s of distress noted.

## 2019-06-05 NOTE — Progress Notes (Signed)
GI Location of Tumor / Histology: Adenocarcinoma of sigmoid colon with Liver mets  Troy Smith presented in 2017 to GI with complaints of abdominal discomfort.  He had CT imaging done that showed diverticulitis and was started on antibiotics.  Upon completion his symptoms continued.  He was again seen by GI and colonoscopy was performed.  CT CAP 06/16/2019:  CT CAP 12/07/2018: A previously seen FDG avid mass of the right lobe of the liver abutting the gallbladder fossa, is reduced in size and more hypodense, now measuring 2.1 x 1.8 cm, previously 4.5 x 3.3 cm.  CT CAP 07/07/2018: Development of isolated segment 4B hepatic metastasis.  Surgical changes in the sigmoid with locally recurrent/regional disease as evidenced by soft tissue nodularity about the posterior aspect of the surgical site, extending in the presacral space. Enlarging perirectal lymph node, suspicious for nodal metastasis.  No acute process or evidence of metastatic disease in the chest. 3.8 x 3.4 cm.  03/23/2016: Low anterior resection with transanal anastomosis.  Colonoscopy 03/20/2016: Fungating, polypoid and ulcerated partially obstructing large mass in the distal sigmoid, mass was circumferential with high grade stricture.  Mass measures 5 cm in length.  Oozing was present.  CT abd/pelvis 11/20/2015: Focal sigmoid diverticulitis is noted without abscess formation.  Focal narrowing and wall thickening of the sigmoid colon is noted in this area most likely due to inflammation, but neoplasm cannot be excluded, and sigmoidoscopy is recommended for further evaluation.  Biopsies of Liver 08/04/2018   Biopsies of Colon- tumor resection 03/23/2016   Past/Anticipated interventions by surgeon, if any:  02/01/2019: , redo of low anterior resection and Liver Resection in [facility]- Dr. Crisoforo Oxford 03/23/2016: Low anterior resection Dr. Arnoldo Morale   Past/Anticipated interventions by medical oncology, if any:  Dr. Delton Coombes 05/31/2019 - 5  cycles of FOLFOX 2/9-05/17/2019.  He will proceed with cycle 6 today. -We will order CT CAP prior to next visit in 2 weeks. -We will refer him to radiation therapy in [place] at that time.  I have discussed options including infusional  5-FU or xeloda. 05/20/2019 -6 cycles of FOLFOX and bevacizumab preoperatively followed by a laparoscopic partial hepatectomy, redo low anterior resection on 02/01/2019. -Pathology showed YPT3Y PN 0. Positive circumferential/radial/mesenteric margin. -3 months of FOLFOX followed by chemoradiation therapy recommended.   Weight changes, if any: Lost about 10 pounds in the last month.  He is not eating well, drinking fluids pretty well.  Bowel/Bladder complaints, if any: Has some lingering diarrhea related to chemo.  Nausea / Vomiting, if any: Mild nausea.  Pain issues, if any:  Has occasional swelling of his joints, due to chemo.  Any blood per rectum: No    SAFETY ISSUES:  Prior radiation? No  Pacemaker/ICD? No  Possible current pregnancy? n/a  Is the patient on methotrexate? No  Current Complaints/Details: -Port placed 08/2018

## 2019-06-06 ENCOUNTER — Ambulatory Visit
Admission: RE | Admit: 2019-06-06 | Discharge: 2019-06-06 | Disposition: A | Discharge status: Home / Self Care | Payer: 59 | Source: Ambulatory Visit | Attending: Radiation Oncology | Admitting: Radiation Oncology

## 2019-06-06 ENCOUNTER — Other Ambulatory Visit: Payer: Self-pay

## 2019-06-06 ENCOUNTER — Encounter: Payer: Self-pay | Admitting: Radiation Oncology

## 2019-06-06 VITALS — Ht 70.0 in | Wt 175.0 lb

## 2019-06-06 DIAGNOSIS — C787 Secondary malignant neoplasm of liver and intrahepatic bile duct: Secondary | ICD-10-CM | POA: Insufficient documentation

## 2019-06-06 DIAGNOSIS — C189 Malignant neoplasm of colon, unspecified: Secondary | ICD-10-CM

## 2019-06-06 NOTE — Progress Notes (Signed)
Radiation Oncology         [phone] ________________________________  Name: Troy Smith        MRN: [id number]  Date of Service: 06/06/2019 DOB: 12-13-1955  [id number], Jenny Reichmann, MD  Derek Jack, MD     REFERRING PHYSICIAN: Derek Jack, MD   DIAGNOSIS: The primary encounter diagnosis was Malignant neoplasm of colon, unspecified part of colon ([place]). A diagnosis of Liver metastasis ([place]) was also pertinent to this visit.   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 male seen at the request of Dr. Delton Coombes for a history of adenocarcinoma of the sigmoid colon originally diagnosed in 2018 treated with LAR with Dr. Arnoldo Morale. He was found to have recurrent disease in the liver in June 2020 and a biopsy at segment 4a on 08/04/2018. He began chemotherapy between July and October 2020. The size of he liver lesion reduced following chemotherapy and also had been found to have recurrence at his anastomosis. Given his persistent locoregional disease and liver disease, he underwent a partial left liver resection and re-resection of the rectum, as well as cholecystectomy with Dr. Crisoforo Oxford at [facility] on 02/01/2019 and his margins of the liver were negative, but he did have a positive radial margin and 0/10 nodes were noted. His gallbladder was negative for malignancy. He begin further chemotherapy in February 2021 with FOLFIRINOX and his last cycle was on 05/31/19. He's contacted today via Mychart to discuss options of adjuvant radiotherapy to the pelvis to cover for his positive margin.   PREVIOUS RADIATION THERAPY: No   PAST MEDICAL HISTORY:  Past Medical History:  Diagnosis Date  . Adenocarcinoma of sigmoid colon ([place]) 03/23/2016  . Carpal tunnel syndrome 05/02/2014   Bilateral  . Cervical disc disorder with radiculopathy of cervical region 05/02/2014   Left C8  . Diabetes mellitus without complication ([place])   . Hypertension   . Port-A-Cath in place 08/10/2018        PAST SURGICAL HISTORY: Past Surgical History:  Procedure Laterality Date  . BIOPSY  03/20/2016   Procedure: BIOPSY;  Surgeon: Rogene Houston, MD;  Location[facility];  Service: Endoscopy;;  colon  . BOWEL RESECTION N/A 03/23/2016   Procedure: LOW ANTERIOR RESECTION;  Surgeon: Aviva Signs, MD;  Location: [facility] ORS;  Service: General;  Laterality: N/A;  . COLONOSCOPY N/A 03/20/2016   Procedure: COLONOSCOPY;  Surgeon: Rogene Houston, MD;  Location: [facility];  Service: Endoscopy;  Laterality: N/A;  730  . COLONOSCOPY N/A 07/10/2016   Procedure: COLONOSCOPY;  Surgeon: Rogene Houston, MD;  Location: [facility];  Service: Endoscopy;  Laterality: N/A;  1040  . NO PAST SURGERIES    . POLYPECTOMY  07/10/2016   Procedure: POLYPECTOMY;  Surgeon: Rogene Houston, MD;  Location[facility];  Service: Endoscopy;;  colon  . PORTACATH PLACEMENT Left 08/05/2018   Procedure: INSERTION PORT-A-CATH;  Surgeon: Aviva Signs, MD;  Location: [facility] ORS;  Service: General;  Laterality: Left;  . TONSILLECTOMY       FAMILY HISTORY:  Family History  Problem Relation Age of Onset  . COPD Mother   . Melanoma Father      SOCIAL HISTORY:  reports that he has never smoked. He has never used smokeless tobacco. He reports current alcohol use. He reports that he does not use drugs. 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 He lives in [place], [place].   ALLERGIES: Penicillin g   MEDICATIONS:  Current  Outpatient Medications  Medication Sig Dispense Refill  . allopurinol (ZYLOPRIM) 300 MG tablet Take 1 tablet (300 mg total) by mouth daily. 30 tablet 3  . amLODipine (NORVASC) 10 MG tablet Take 10 mg by mouth daily.    . Bevacizumab (AVASTIN IV) Inject into the vein every 14 (fourteen) days.    Marland Kitchen dextrose 5 % SOLN 1,000 mL with fluorouracil 5 GM/100ML SOLN Inject into the vein over 48 hr.    . DIPHENHIST 12.5 MG/5ML liquid SWISH AND SPIT/SWALLOWOONE TEASPOONFUL BY MOUTH 4 TIMES DAILY AS NEEEDE FOR MOUTH  PAIN.    . diphenoxylate-atropine (LOMOTIL) 2.5-0.025 MG tablet Take 1 tablet by mouth 4 (four) times daily as needed for diarrhea or loose stools. 30 tablet 0  . donepezil (ARICEPT) 10 MG tablet Take 1 tablet (10 mg total) by mouth at bedtime. 30 tablet 6  . FLUOROURACIL IV Inject into the vein every 14 (fourteen) days.    Marland Kitchen HYDROcodone-acetaminophen (NORCO) 5-325 MG tablet Take 1 tablet by mouth every 4 (four) hours as needed for moderate pain. 30 tablet 0  . insulin glargine (LANTUS) 100 UNIT/ML injection Inject 30 Units into the skin at bedtime.     . IRINOTECAN HCL IV Inject into the vein every 14 (fourteen) days.    Marland Kitchen LEUCOVORIN CALCIUM IV Inject into the vein every 14 (fourteen) days.    Marland Kitchen lidocaine (XYLOCAINE) 2 % solution SMARTSIG:1 Teaspoon By Mouth 4 Times Daily PRN    . lidocaine-prilocaine (EMLA) cream Apply a small amount to port a cath site and cover with plastic wrap 1 hour prior to chemotherapy appointments 30 g 3  . loperamide (IMODIUM A-D) 2 MG tablet Take 2 at onset of diarrhea, then 1 after each watery bowel movement. 100 tablet 1  . magic mouthwash w/lidocaine SOLN Take 5 mLs by mouth 4 (four) times daily as needed for mouth pain. 360 mL 3  . metoprolol succinate (TOPROL-XL) 50 MG 24 hr tablet Take 50 mg by mouth daily. Take with or immediately following a meal.    . MITIGARE 0.6 MG CAPS Take 1 capsule by mouth as needed.     . OXALIPLATIN IV Inject into the vein every 14 (fourteen) days.    . prochlorperazine (COMPAZINE) 10 MG tablet Take 1 tablet (10 mg total) by mouth every 6 (six) hours as needed (Nausea or vomiting). 60 tablet 1  . QC ANTACID/ANTI-GAS 200-200-20 MG/5ML suspension SMARTSIG:1 Teaspoon By Mouth 4 Times Daily PRN    . scopolamine (TRANSDERM-SCOP) 1 MG/3DAYS Place 1 patch (1.5 mg total) onto the skin every 3 (three) days. 10 patch 12   No current facility-administered medications for this encounter.     REVIEW OF SYSTEMS: On review of systems, the patient  reports that he is doing okay but has had some nausea with his most recent chemotherapy as well as progressive diarrhea. He has been fatigued some but overall he feels he's doing well. He lost about 10 pounds in the last few months. He is taking OTC imodium and lomotil. He denies any chest pain, shortness of breath, cough, fevers, chills, night sweats. He denies any bladder disturbances, and denies abdominal pain,  or vomiting. He denies any new musculoskeletal or joint aches or pains. A complete review of systems is obtained and is otherwise negative.     PHYSICAL EXAM:  Wt Readings from Last 3 Encounters:  05/31/19 175 lb 8 oz (79.6 kg)  05/17/19 180 lb 6.4 oz (81.8 kg)  05/02/19 184 lb (83.5  kg)   In general this is a thin appearing caucasian male in no acute distress. He's alert and oriented x4 and appropriate throughout the examination. Cardiopulmonary assessment is negative for acute distress and he exhibits normal effort.    ECOG = 0  0 - Asymptomatic (Fully active, able to carry on all predisease activities without restriction)  1 - Symptomatic but completely ambulatory (Restricted in physically strenuous activity but ambulatory and able to carry out work of a light or sedentary nature. For example, light housework, office work)  2 - Symptomatic, <50% in bed during the day (Ambulatory and capable of all self care but unable to carry out any work activities. Up and about more than 50% of waking hours)  3 - Symptomatic, >50% in bed, but not bedbound (Capable of only limited self-care, confined to bed or chair 50% or more of waking hours)  4 - Bedbound (Completely disabled. Cannot carry on any self-care. Totally confined to bed or chair)  5 - Death   Eustace Pen MM, Creech RH, Tormey DC, et al. [id number]). "Toxicity and response criteria of the [facility] [employer]". [place] Oncol. 5 (6): 649-55    LABORATORY DATA:  Lab Results  Component Value Date   WBC 6.2 05/31/2019    HGB 8.8 (L) 05/31/2019   HCT 29.1 (L) 05/31/2019   MCV 87.4 05/31/2019   PLT 141 (L) 05/31/2019   Lab Results  Component Value Date   NA 139 05/31/2019   K 3.5 05/31/2019   CL 105 05/31/2019   CO2 25 05/31/2019   Lab Results  Component Value Date   ALT 8 05/31/2019   AST 11 (L) 05/31/2019   ALKPHOS 87 05/31/2019   BILITOT 0.5 05/31/2019      RADIOGRAPHY: No results found.     IMPRESSION/PLAN: 1. 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 Dr. Lisbeth Renshaw discusses the pathology findings and reviews the nature of colorectal cancers and outlines the rationale for considering radiotherapy to the rectal margin of his re-excision surgery. Despite his metastatic diagnosis, he's done very well and would be a candidate for aggressive local therapy to reduce risks of local recurrence. We discussed the risks, benefits, short, and long term effects of radiotherapy, and the patient is interested in proceeding. Dr. Lisbeth Renshaw discusses the delivery and logistics of radiotherapy and anticipates a course of 5 1/2 weeks of radiotherapy with concurrent chemotherapy. He will proceed with simulation on 06/20/19 and we anticipate starting therapy the week of 06/26/19. 2. Diarrhea. This may be multifactorial given his cholecystectomy and second resection of the bowel as well as from chemotherapy. He is already taking lomotil and imodium. I have reached out to Dr. Delton Coombes to determine if he may need more medication management so he doesn't become hypomagnesemic or have other electrolyte abnormalities as diarrhea is often a side effect of chemoRT. 3. Chemo induced nausea. He will continue prn antiemetics and we will follow this expectantly.  This encounter was provided by telemedicine platform MyChart.  The patient has provided two factor identification and has given verbal consent for this type of encounter and has been advised to only accept a meeting of this type in a secure network  environment. The time spent during this encounter was 60 minutes including preparation, discussion, and coordination of the patient's care. The attendants for this meeting include Blenda Nicely, RN, Dr. Lisbeth Renshaw, Hayden Pedro  and Olean Ree and his wife Krystofer Connon .  During the encounter,  Blenda Nicely,  RN, Dr. Lisbeth Renshaw, and Hayden Pedro were located at [facility] Radiation Oncology Department.  Olean Ree and his wife Shirlean Mylar were located at home.    The above documentation reflects my direct findings during this shared patient visit. Please see the separate note by Dr. Lisbeth Renshaw on this date for the remainder of the patient's plan of care.    Carola Rhine, PAC

## 2019-06-14 ENCOUNTER — Other Ambulatory Visit (HOSPITAL_COMMUNITY): Payer: 59

## 2019-06-14 ENCOUNTER — Ambulatory Visit (HOSPITAL_COMMUNITY): Payer: 59 | Admitting: Hematology

## 2019-06-15 ENCOUNTER — Other Ambulatory Visit (HOSPITAL_COMMUNITY): Payer: Self-pay | Admitting: *Deleted

## 2019-06-15 DIAGNOSIS — C187 Malignant neoplasm of sigmoid colon: Secondary | ICD-10-CM

## 2019-06-15 DIAGNOSIS — Z95828 Presence of other vascular implants and grafts: Secondary | ICD-10-CM

## 2019-06-15 MED ORDER — LOPERAMIDE HCL 2 MG PO TABS: ORAL_TABLET | ORAL | 1 refills | Status: DC

## 2019-06-16 ENCOUNTER — Ambulatory Visit (HOSPITAL_COMMUNITY)
Admission: RE | Admit: 2019-06-16 | Discharge: 2019-06-16 | Disposition: A | Discharge status: Home / Self Care | Payer: 59 | Source: Ambulatory Visit | Attending: Hematology | Admitting: Hematology

## 2019-06-16 ENCOUNTER — Other Ambulatory Visit (HOSPITAL_COMMUNITY): Payer: Self-pay | Admitting: *Deleted

## 2019-06-16 ENCOUNTER — Other Ambulatory Visit: Payer: Self-pay

## 2019-06-16 DIAGNOSIS — C187 Malignant neoplasm of sigmoid colon: Secondary | ICD-10-CM | POA: Insufficient documentation

## 2019-06-16 MED ORDER — DIPHENOXYLATE-ATROPINE 2.5-0.025 MG PO TABS: 1.0000 | ORAL_TABLET | Freq: Four times a day (QID) | ORAL | 0 refills | Status: DC | PRN

## 2019-06-16 MED ORDER — IOHEXOL 300 MG/ML  SOLN
100.0000 mL | Freq: Once | INTRAMUSCULAR | Status: AC | PRN
  Administered 2019-06-16: 100 mL via INTRAVENOUS

## 2019-06-20 ENCOUNTER — Other Ambulatory Visit: Payer: Self-pay

## 2019-06-20 ENCOUNTER — Other Ambulatory Visit (HOSPITAL_COMMUNITY): Payer: 59

## 2019-06-20 ENCOUNTER — Ambulatory Visit
Admission: RE | Admit: 2019-06-20 | Discharge: 2019-06-20 | Disposition: A | Discharge status: Home / Self Care | Payer: 59 | Source: Ambulatory Visit | Attending: Radiation Oncology | Admitting: Radiation Oncology

## 2019-06-20 ENCOUNTER — Other Ambulatory Visit (HOSPITAL_COMMUNITY): Payer: Self-pay | Admitting: Hematology

## 2019-06-20 ENCOUNTER — Ambulatory Visit (HOSPITAL_COMMUNITY): Payer: 59 | Admitting: Hematology

## 2019-06-20 ENCOUNTER — Telehealth: Payer: Self-pay | Admitting: *Deleted

## 2019-06-20 ENCOUNTER — Telehealth (HOSPITAL_COMMUNITY): Payer: Self-pay | Admitting: *Deleted

## 2019-06-20 DIAGNOSIS — R195 Other fecal abnormalities: Secondary | ICD-10-CM

## 2019-06-20 DIAGNOSIS — M25661 Stiffness of right knee, not elsewhere classified: Secondary | ICD-10-CM

## 2019-06-20 DIAGNOSIS — C187 Malignant neoplasm of sigmoid colon: Secondary | ICD-10-CM

## 2019-06-20 DIAGNOSIS — C189 Malignant neoplasm of colon, unspecified: Secondary | ICD-10-CM

## 2019-06-20 DIAGNOSIS — M7989 Other specified soft tissue disorders: Secondary | ICD-10-CM

## 2019-06-20 DIAGNOSIS — C787 Secondary malignant neoplasm of liver and intrahepatic bile duct: Secondary | ICD-10-CM | POA: Diagnosis not present

## 2019-06-20 DIAGNOSIS — Z510 Encounter for antineoplastic radiation therapy: Secondary | ICD-10-CM | POA: Diagnosis not present

## 2019-06-20 MED ORDER — CAPECITABINE 500 MG PO TABS: 1500.0000 mg | ORAL_TABLET | Freq: Two times a day (BID) | ORAL | 0 refills | Status: DC

## 2019-06-20 NOTE — Progress Notes (Signed)
I signed consent today with the patient.  He and his wife discussed that they still has some questions regarding the decision making for his upcoming chemotherapy regimen with the radiotherapy that he will receive, and I told him that I would reach out to Dr. Delton Coombes.  He does have persistent difficulty with diarrhea despite over-the-counter Imodium and Lomotil.  He is hoping now that Dr. Delton Coombes made recommendations for scheduling Lomotil, that this will be helpful, we did discuss the rationale for checking baseline chemistries including magnesium levels prior to him starting radiation next week, so that we have comparisons if he were to have progressive diarrhea in the midst of his upcoming treatment both from chemotherapy or radiotherapy.  We also talked about other options if his symptoms do not respond to the Lomotil however hopefully this will be helpful for him and we will have to escalate further.  Dr. Delton Coombes was also informed and we will continue to follow this together.    Carola Rhine, PAC

## 2019-06-20 NOTE — Telephone Encounter (Signed)
Spoke with pt's wife, Troy Smith, and she reports Troy Smith would like to give the Xeloda a try vs. M-F 5FU infusion for the duration of radiation.  Side effects profile reviewed and informed that often the Xeloda can cause worse/more pronounced side effects of 5FU infusion.  She verbalizes understand, but states [name] "really doesn't like the pump" and would still like to continue with trying the Xeloda, and if he doesn't tolerate it, then switching to the 5FU.  Dr. Delton Coombes notified of the above and will send Xeloda Rx to [facility]. Troy Smith informed the Rx would be sent today, and they should be expecting a call from the pharmacy to set up delivery of medication.

## 2019-06-20 NOTE — Telephone Encounter (Signed)
CALLED PATIENT TO INFORM OF LAB APPT. FOR 06-26-19 @ 2 PM, LVM FOR A RETURN CALL

## 2019-06-21 ENCOUNTER — Telehealth (HOSPITAL_COMMUNITY): Payer: Self-pay | Admitting: Pharmacist

## 2019-06-21 ENCOUNTER — Telehealth (HOSPITAL_COMMUNITY): Payer: Self-pay | Admitting: Pharmacy Technician

## 2019-06-21 ENCOUNTER — Telehealth: Payer: Self-pay | Admitting: *Deleted

## 2019-06-21 DIAGNOSIS — C187 Malignant neoplasm of sigmoid colon: Secondary | ICD-10-CM

## 2019-06-21 MED ORDER — CAPECITABINE 500 MG PO TABS: 1500.0000 mg | ORAL_TABLET | Freq: Two times a day (BID) | ORAL | 0 refills | Status: DC

## 2019-06-21 NOTE — Telephone Encounter (Signed)
RETURNED PATIENT'S WIFE'S PHONE [place], SPOKE WITH PATIENT'S WIFE- [place]

## 2019-06-21 NOTE — Telephone Encounter (Signed)
Oral Chemotherapy Pharmacist Encounter  Due to insurance restriction the medication could not be filled at [facility]. Prescription has been e-scribed to [facility]  Supportive information was faxed to [place]. We will continue to follow medication access.   Attempted to call patient to notify him no answer LVM for patient to call back.  Darl Pikes, PharmD, BCPS, [facility] Hematology/Oncology Clinical Pharmacist [facility] [place] [facility] [phone]  06/21/2019 9:58 AM

## 2019-06-21 NOTE — Telephone Encounter (Signed)
Oral Oncology Pharmacist Encounter  Received new prescription for Xeloda (capecitabine) for the treatment of metastatic colon cancer in conjunction with radiation.  CMP from 05/31/19 assessed, no relevant lab abnormalities. Prescription dose and frequency assessed.   Current medication list in Epic reviewed, one relevant DDIs with capecitabine identified: - Allopurinol, category X interaction (avoid combination): allopurinol may decrease the concentration of the active metabolite of capecitabine. Recommend stopping the allopurinol and considering an alternative for his joint pain management.  Prescription has been e-scribed to the [facility] for benefits analysis and approval.  Oral Oncology Clinic will continue to follow for insurance authorization, copayment issues, initial counseling and start date.  Darl Pikes, PharmD, BCPS, BCOP, CPP Hematology/Oncology Clinical Pharmacist Practitioner [facility] [place] [facility] [phone]  06/21/2019 8:56 AM

## 2019-06-21 NOTE — Telephone Encounter (Signed)
Oral Oncology Patient Advocate Encounter  After completing a benefits investigation, prior authorization for Xeloda is not required at this time through [employer].  Patient must use [employer].    I will follow up with [employer] for copay and delivery date.  [place] Patient [place] Phone [phone] Fax [phone] 06/21/2019 8:43 AM

## 2019-06-22 NOTE — Telephone Encounter (Signed)
Oral Chemotherapy Pharmacist Encounter  Patient's wife returned my call and I provided her with the phone number to [place]. Instructed her to call today so that the medication could be received in time to start with radiation.  Patient Education I spoke with patient's wife Shirlean Mylar for overview of new oral chemotherapy medication: Xeloda (capecitabine) for the treatment of metastatic colon cancer in conjunction with radiation.   Counseled Robin on administration, dosing, side effects, monitoring, drug-food interactions, safe handling, storage, and disposal. Patient will take 3 tablets (1,500 mg total) by mouth 2 (two) times daily after a meal. Take Monday through Friday. Take only on days of radiation.  Side effects include but not limited to: diarrhea, hand-foot syndrome, fatigue, edema, N/V.    Reviewed allopurinol DDI with Shirlean Mylar and she stated she would have her husband stop the allopurinol while on the capecitabine.  Reviewed with Shirlean Mylar importance of keeping a medication schedule and plan for any missed doses.  Robin voiced understanding and appreciation. All questions answered.  Provided Shirlean Mylar knows to call the office with questions or concerns. Oral Chemotherapy Navigation Clinic will continue to follow.  Darl Pikes, PharmD, BCPS, BCOP, CPP Hematology/Oncology Clinical Pharmacist Practitioner [facility] [place] [facility] [phone]  06/22/2019 11:34 AM

## 2019-06-23 DIAGNOSIS — Z510 Encounter for antineoplastic radiation therapy: Secondary | ICD-10-CM | POA: Diagnosis not present

## 2019-06-26 ENCOUNTER — Encounter (HOSPITAL_COMMUNITY): Payer: Self-pay | Admitting: *Deleted

## 2019-06-26 ENCOUNTER — Ambulatory Visit
Admission: RE | Admit: 2019-06-26 | Discharge: 2019-06-26 | Disposition: A | Discharge status: Home / Self Care | Payer: 59 | Source: Ambulatory Visit | Attending: Radiation Oncology | Admitting: Radiation Oncology

## 2019-06-26 ENCOUNTER — Other Ambulatory Visit: Payer: Self-pay

## 2019-06-26 ENCOUNTER — Inpatient Hospital Stay (HOSPITAL_COMMUNITY): Payer: 59

## 2019-06-26 ENCOUNTER — Emergency Department (HOSPITAL_COMMUNITY)
Admission: EM | Admit: 2019-06-26 | Discharge: 2019-06-26 | Disposition: A | Discharge status: Home / Self Care | Payer: 59 | Attending: Emergency Medicine | Admitting: Emergency Medicine

## 2019-06-26 ENCOUNTER — Emergency Department (HOSPITAL_COMMUNITY): Payer: 59

## 2019-06-26 ENCOUNTER — Inpatient Hospital Stay (HOSPITAL_COMMUNITY): Payer: 59 | Attending: Hematology | Admitting: Hematology

## 2019-06-26 DIAGNOSIS — Z85038 Personal history of other malignant neoplasm of large intestine: Secondary | ICD-10-CM | POA: Diagnosis not present

## 2019-06-26 DIAGNOSIS — Z510 Encounter for antineoplastic radiation therapy: Secondary | ICD-10-CM | POA: Diagnosis not present

## 2019-06-26 DIAGNOSIS — Z79899 Other long term (current) drug therapy: Secondary | ICD-10-CM | POA: Insufficient documentation

## 2019-06-26 DIAGNOSIS — Z794 Long term (current) use of insulin: Secondary | ICD-10-CM | POA: Insufficient documentation

## 2019-06-26 DIAGNOSIS — Z9221 Personal history of antineoplastic chemotherapy: Secondary | ICD-10-CM | POA: Insufficient documentation

## 2019-06-26 DIAGNOSIS — R404 Transient alteration of awareness: Secondary | ICD-10-CM | POA: Diagnosis not present

## 2019-06-26 DIAGNOSIS — C187 Malignant neoplasm of sigmoid colon: Secondary | ICD-10-CM

## 2019-06-26 DIAGNOSIS — R4182 Altered mental status, unspecified: Secondary | ICD-10-CM | POA: Diagnosis present

## 2019-06-26 DIAGNOSIS — R413 Other amnesia: Secondary | ICD-10-CM | POA: Insufficient documentation

## 2019-06-26 DIAGNOSIS — C787 Secondary malignant neoplasm of liver and intrahepatic bile duct: Secondary | ICD-10-CM | POA: Insufficient documentation

## 2019-06-26 DIAGNOSIS — I10 Essential (primary) hypertension: Secondary | ICD-10-CM | POA: Insufficient documentation

## 2019-06-26 DIAGNOSIS — M109 Gout, unspecified: Secondary | ICD-10-CM | POA: Insufficient documentation

## 2019-06-26 DIAGNOSIS — E119 Type 2 diabetes mellitus without complications: Secondary | ICD-10-CM | POA: Insufficient documentation

## 2019-06-26 LAB — URINALYSIS, ROUTINE W REFLEX MICROSCOPIC
Bacteria, UA: NONE SEEN
Bilirubin Urine: NEGATIVE
Glucose, UA: NEGATIVE mg/dL
Ketones, ur: NEGATIVE mg/dL
Leukocytes,Ua: NEGATIVE
Nitrite: NEGATIVE
Protein, ur: NEGATIVE mg/dL
RBC / HPF: >50 RBC/hpf — ABNORMAL HIGH (ref 0–5)
Specific Gravity, Urine: 1.015 (ref 1.005–1.030)
pH: 5 (ref 5.0–8.0)

## 2019-06-26 LAB — CBC WITH DIFFERENTIAL/PLATELET
Abs Immature Granulocytes: 0.01 10*3/uL (ref 0.00–0.07)
Abs Immature Granulocytes: 0.02 K/uL (ref 0.00–0.07)
Basophils Absolute: 0 10*3/uL (ref 0.0–0.1)
Basophils Absolute: 0 K/uL (ref 0.0–0.1)
Basophils Relative: 0 %
Basophils Relative: 1 %
Eosinophils Absolute: 0.1 10*3/uL (ref 0.0–0.5)
Eosinophils Absolute: 0.1 K/uL (ref 0.0–0.5)
Eosinophils Relative: 2 %
Eosinophils Relative: 1 %
HCT: 30.8 % — ABNORMAL LOW (ref 39.0–52.0)
HCT: 30.9 % — ABNORMAL LOW (ref 39.0–52.0)
Hemoglobin: 9.5 g/dL — ABNORMAL LOW (ref 13.0–17.0)
Hemoglobin: 9.6 g/dL — ABNORMAL LOW (ref 13.0–17.0)
Immature Granulocytes: 0 %
Immature Granulocytes: 0 %
Lymphocytes Relative: 22 %
Lymphocytes Relative: 16 %
Lymphs Abs: 1.1 10*3/uL (ref 0.7–4.0)
Lymphs Abs: 1.1 K/uL (ref 0.7–4.0)
MCH: 28.5 pg (ref 26.0–34.0)
MCH: 28.7 pg (ref 26.0–34.0)
MCHC: 30.8 g/dL (ref 30.0–36.0)
MCHC: 31.1 g/dL (ref 30.0–36.0)
MCV: 92.5 fL (ref 80.0–100.0)
MCV: 92.2 fL (ref 80.0–100.0)
Monocytes Absolute: 0.4 10*3/uL (ref 0.1–1.0)
Monocytes Absolute: 0.4 K/uL (ref 0.1–1.0)
Monocytes Relative: 7 %
Monocytes Relative: 6 %
Neutro Abs: 3.4 10*3/uL (ref 1.7–7.7)
Neutro Abs: 5.1 K/uL (ref 1.7–7.7)
Neutrophils Relative %: 69 %
Neutrophils Relative %: 76 %
Platelets: 134 10*3/uL — ABNORMAL LOW (ref 150–400)
Platelets: 149 K/uL — ABNORMAL LOW (ref 150–400)
RBC: 3.33 MIL/uL — ABNORMAL LOW (ref 4.22–5.81)
RBC: 3.35 MIL/uL — ABNORMAL LOW (ref 4.22–5.81)
RDW: 18.9 % — ABNORMAL HIGH (ref 11.5–15.5)
RDW: 19 % — ABNORMAL HIGH (ref 11.5–15.5)
WBC: 5 10*3/uL (ref 4.0–10.5)
WBC: 6.7 K/uL (ref 4.0–10.5)
nRBC: 0 % (ref 0.0–0.2)
nRBC: 0 % (ref 0.0–0.2)

## 2019-06-26 LAB — COMPREHENSIVE METABOLIC PANEL WITH GFR
ALT: 9 U/L (ref 0–44)
AST: 12 U/L — ABNORMAL LOW (ref 15–41)
Albumin: 4 g/dL (ref 3.5–5.0)
Alkaline Phosphatase: 69 U/L (ref 38–126)
Anion gap: 9 (ref 5–15)
BUN: 21 mg/dL (ref 8–23)
CO2: 25 mmol/L (ref 22–32)
Calcium: 9.4 mg/dL (ref 8.9–10.3)
Chloride: 107 mmol/L (ref 98–111)
Creatinine, Ser: 0.93 mg/dL (ref 0.61–1.24)
GFR calc Af Amer: >60 mL/min (ref >60)
GFR calc non Af Amer: >60 mL/min (ref >60)
Glucose, Bld: 113 mg/dL — ABNORMAL HIGH (ref 70–99)
Potassium: 3.6 mmol/L (ref 3.5–5.1)
Sodium: 141 mmol/L (ref 135–145)
Total Bilirubin: 0.7 mg/dL (ref 0.3–1.2)
Total Protein: 6.6 g/dL (ref 6.5–8.1)

## 2019-06-26 LAB — COMPREHENSIVE METABOLIC PANEL
ALT: 9 U/L (ref 0–44)
AST: 13 U/L — ABNORMAL LOW (ref 15–41)
Albumin: 3.6 g/dL (ref 3.5–5.0)
Alkaline Phosphatase: 62 U/L (ref 38–126)
Anion gap: 10 (ref 5–15)
BUN: 19 mg/dL (ref 8–23)
CO2: 25 mmol/L (ref 22–32)
Calcium: 9.5 mg/dL (ref 8.9–10.3)
Chloride: 105 mmol/L (ref 98–111)
Creatinine, Ser: 0.9 mg/dL (ref 0.61–1.24)
GFR calc Af Amer: >60 mL/min (ref >60)
GFR calc non Af Amer: >60 mL/min (ref >60)
Glucose, Bld: 160 mg/dL — ABNORMAL HIGH (ref 70–99)
Potassium: 4.2 mmol/L (ref 3.5–5.1)
Sodium: 140 mmol/L (ref 135–145)
Total Bilirubin: 0.8 mg/dL (ref 0.3–1.2)
Total Protein: 6.2 g/dL — ABNORMAL LOW (ref 6.5–8.1)

## 2019-06-26 LAB — MAGNESIUM: Magnesium: 1.8 mg/dL (ref 1.7–2.4)

## 2019-06-26 LAB — URIC ACID: Uric Acid, Serum: 4.2 mg/dL (ref 3.7–8.6)

## 2019-06-26 MED ORDER — SODIUM CHLORIDE 0.9 % IV BOLUS
1000.0000 mL | Freq: Once | INTRAVENOUS | Status: AC
  Administered 2019-06-26: 1000 mL via INTRAVENOUS

## 2019-06-26 MED ORDER — FEBUXOSTAT 40 MG PO TABS: 40.0000 mg | ORAL_TABLET | Freq: Every day | ORAL | 2 refills | Status: DC

## 2019-06-26 MED ORDER — DIPHENOXYLATE-ATROPINE 2.5-0.025 MG PO TABS: 1.0000 | ORAL_TABLET | Freq: Four times a day (QID) | ORAL | 2 refills | Status: DC | PRN

## 2019-06-26 NOTE — Discharge Instructions (Signed)
You were seen in the emergency department today with a temporary episode of confusion.  It is unclear if this is related to your chemotherapy but I discussed the case with your oncologist.  He will likely be ordering an outpatient MRI.  Please call in the morning to confirm this along with discussion of your chemotherapy moving forward.  If you develop return of symptoms, weakness, numbness, or fever.

## 2019-06-26 NOTE — ED Notes (Signed)
Pt states this was the first shot of that does of chemo and they told him today he may have side effects from the meds, so he is not sure if confusion is a side effect

## 2019-06-26 NOTE — ED Triage Notes (Signed)
Pt states he received chemo today and he took a nap and when he woke up he states he couldn't remember who he was or where he was; pt states he woke up around 12 today and he felt confused and foggy until about 4:30pm pt states he now feels like he did before he took the nap, back to normal; pt's last known well was around 11 am today

## 2019-06-26 NOTE — Patient Instructions (Signed)
[place] at [facility] Discharge Instructions  You were seen today by Dr. Delton Coombes. He went over your recent lab results. Watch for redness, peeling and blisters on your hands and feet, if any of these occur contact us immediately. He will see you back in 1 week for labs and follow up.   Thank you for choosing [place] at [facility] to provide your oncology and hematology care.  To afford each patient quality time with our provider, please arrive at least 15 minutes before your scheduled appointment time.   If you have a lab appointment with the [facility] please come in thru the  Main Entrance and check in at the main information desk  You need to re-schedule your appointment should you arrive 10 or more minutes late.  We strive to give you quality time with our providers, and arriving late affects you and other patients whose appointments are after yours.  Also, if you no show three or more times for appointments you may be dismissed from the clinic at the providers discretion.     Again, thank you for choosing [facility].  Our hope is that these requests will decrease the amount of time that you wait before being seen by our physicians.       _____________________________________________________________  Should you have questions after your visit to [facility], please contact our office at [phone] between the hours of 8:00 a.m. and 4:30 p.m.  Voicemails left after 4:00 p.m. will not be returned until the following business day.  For prescription refill requests, have your pharmacy contact our office and allow 72 hours.    Cancer Center Support Programs:   > Cancer Support Group  2nd Tuesday of the month 1pm-2pm, Journey Room

## 2019-06-26 NOTE — ED Provider Notes (Signed)
Emergency Department Provider Note   I have reviewed the triage vital signs and the nursing notes.   HISTORY  Chief Complaint Altered Mental Status   HPI Troy Smith is a 64 y.o. male with PMH reviewed including metastatic colon cancer presents to the emergency department today with episode of confusion after starting a new chemotherapy agent. Patient started Xeloda today for the first time and went for radiation as well.  Patient states that several hours after taking the medication he felt acutely confused.  He was unsure of where he was and was found wandering around a parking lot when his wife came to pick him up.  No fever.  Patient states this lasted for several hours but is now feeling well.  Can tell me the date precisely and that he is in the [facility] emergency department.  He did not recall having any headache, weakness, numbness, or vision change during the incident.  The patient's wife reached out to his oncologist Dr. Delton Coombes and was referred back to the emergency department and presented by private vehicle.   Past Medical History:  Diagnosis Date  . 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 Carpal tunnel syndrome 05/02/2014   Bilateral  . Cervical disc disorder with radiculopathy of cervical region 05/02/2014   Left C8  . Diabetes mellitus without complication ([place])   . Hypertension   . Port-A-Cath in place 08/10/2018    Patient Active Problem List   Diagnosis Date Noted  . Liver metastasis ([place]) 06/06/2019  . Cognitive dysfunction 10/19/2018  . Goals of care, counseling/discussion 08/10/2018  . Port-A-Cath in place 08/10/2018  . Malignant neoplasm of colon ([place]) 06/16/2016  . Iron deficiency anemia due to chronic blood loss 04/20/2016  . 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 Blood in stool 02/28/2016  . Weight loss 02/28/2016  . Diverticulitis of sigmoid colon 02/28/2016  . Essential hypertension 11/20/2015  . Loss of weight  11/20/2015  . Guaiac positive stools 11/20/2015  . Change in stool 11/20/2015  . Carpal tunnel syndrome 05/02/2014  . Cervical disc disorder with radiculopathy of cervical region 05/02/2014    Past Surgical History:  Procedure Laterality Date  . BIOPSY  03/20/2016   Procedure: BIOPSY;  Surgeon: Rogene Houston, MD;  Location: [facility];  Service: Endoscopy;;  colon  . BOWEL RESECTION N/A 03/23/2016   Procedure: LOW ANTERIOR RESECTION;  Surgeon: Aviva Signs, MD;  Location: [facility] ORS;  Service: General;  Laterality: N/A;  . COLON SURGERY  01/2019   Done at [facility]  . COLONOSCOPY N/A 03/20/2016   Procedure: COLONOSCOPY;  Surgeon: Rogene Houston, MD;  Location: [facility];  Service: Endoscopy;  Laterality: N/A;  730  . COLONOSCOPY N/A 07/10/2016   Procedure: COLONOSCOPY;  Surgeon: Rogene Houston, MD;  Location: [facility];  Service: Endoscopy;  Laterality: N/A;  1040  . NO PAST SURGERIES    . POLYPECTOMY  07/10/2016   Procedure: POLYPECTOMY;  Surgeon: Rogene Houston, MD;  Location: [facility];  Service: Endoscopy;;  colon  . PORTACATH PLACEMENT Left 08/05/2018   Procedure: INSERTION PORT-A-CATH;  Surgeon: Aviva Signs, MD;  Location: [facility] OR[facility];  Service: General;  Laterality: Left;  . TONSILLECTOMY      Allergies Penicillin g  Family History  Problem Relation Age of Onset  . COPD Mother   . Melanoma Father     Social History Social History   Tobacco Use  . Smoking status: Never  Smoker  . Smokeless tobacco: Never Used  Substance Use Topics  . Alcohol use: Yes    Comment: rarely  . Drug use: No    Review of Systems  Constitutional: No fever/chills Eyes: No visual changes. ENT: No sore throat. Cardiovascular: Denies chest pain. Respiratory: Denies shortness of breath. Gastrointestinal: No abdominal pain.  No nausea, no vomiting.  No diarrhea.  No constipation. Genitourinary: Negative for dysuria. Musculoskeletal: Negative for back pain. Skin: Negative for  rash. Neurological: Negative for headaches, focal weakness or numbness. Episode of AMS now resolved.   10-point ROS otherwise negative.  ____________________________________________   PHYSICAL EXAM:  VITAL SIGNS: ED Triage Vitals  Enc Vitals Group     BP 06/26/19 1727 (!) 143/96     Pulse Rate 06/26/19 1727 71     Resp 06/26/19 1727 16     Temp 06/26/19 1727 98 F (36.7 C)     Temp Source 06/26/19 1727 Oral     SpO2 06/26/19 1727 100 %     Weight 06/26/19 1728 177 lb 4.8 oz (80.4 kg)     Height 06/26/19 1728 5\' 11"  (1.803 m)   Constitutional: Alert and oriented x 4. Well appearing and in no acute distress. Eyes: Conjunctivae are normal. Head: Atraumatic. Nose: No congestion/rhinnorhea. Mouth/Throat: Mucous membranes are moist.  Neck: No stridor.   Cardiovascular: Normal rate, regular rhythm. Good peripheral circulation. Grossly normal heart sounds.   Respiratory: Normal respiratory effort.  No retractions. Lungs CTAB. Gastrointestinal: Soft and nontender. No distention.  Musculoskeletal: No lower extremity tenderness nor edema. No gross deformities of extremities. Neurologic:  Normal speech and language. No gross focal neurologic deficits are appreciated.  No facial asymmetry.  No pronator drift.  5 out of 5 strength in the bilateral upper and lower extremities with normal sensation. Skin:  Skin is warm, dry and intact. No rash noted.   ____________________________________________   LABS (all labs ordered are listed, but only abnormal results are displayed)  Labs Reviewed  COMPREHENSIVE METABOLIC PANEL - Abnormal; Notable for the following components:      Result Value   Glucose, Bld 113 (*)    AST 12 (*)    All other components within normal limits  CBC WITH DIFFERENTIAL/PLATELET - Abnormal; Notable for the following components:   RBC 3.35 (*)    Hemoglobin 9.6 (*)    HCT 30.9 (*)    RDW 19.0 (*)    Platelets 149 (*)    All other components within normal limits    URINALYSIS, ROUTINE W REFLEX MICROSCOPIC - Abnormal; Notable for the following components:   APPearance HAZY (*)    Hgb urine dipstick MODERATE (*)    RBC / HPF >50 (*)    All other components within normal limits  URINE CULTURE   ____________________________________________  EKG   EKG Interpretation  Date/Time:  Monday Jun 26 2019 20:45:02 EDT Ventricular Rate:  73 PR Interval:    QRS Duration: 98 QT Interval:  395 QTC Calculation: 436 R Axis:   -6 Text Interpretation: Sinus rhythm No STEMI Confirmed by Nanda Quinton [id number] on 06/26/2019 8:47:49 PM       ____________________________________________  RADIOLOGY  CT Head Wo Contrast  Result Date: 06/26/2019 CLINICAL DATA:  Encephalopathy. Confusion. History of colon cancer with Mets to liver. EXAM: CT HEAD WITHOUT CONTRAST TECHNIQUE: Contiguous axial images were obtained from the base of the skull through the vertex without intravenous contrast. COMPARISON:  Brain MRI 09/16/2018 FINDINGS: Brain: No intracranial hemorrhage, mass effect,  or midline shift. No evidence of edema to suggest underlying lesion. No hydrocephalus. The basilar cisterns are patent. No evidence of territorial infarct or acute ischemia. No extra-axial or intracranial fluid collection. Vascular: No hyperdense vessel or unexpected calcification. Skull: No fracture or focal lesion. Sinuses/Orbits: Paranasal sinuses and mastoid air cells are clear. The visualized orbits are unremarkable. Other: None. IMPRESSION: Negative noncontrast head CT. Electronically Signed   By: Keith Rake M.D.   On: 06/26/2019 19:41    ____________________________________________   PROCEDURES  Procedure(s) performed:   Procedures  None  ____________________________________________   INITIAL IMPRESSION / ASSESSMENT AND PLAN / ED COURSE  Pertinent labs & imaging results that were available during my care of the patient were reviewed by me and considered in my medical  decision making (see chart for details).   Patient presents to the emergency department with episode of altered mental status.  He was found confused and wandering out in the parking lot after his radiation and first does of Xeloda.  He is alert and oriented x4 with me here.  He has no focal neurologic deficits.  Suspect reaction to his chemotherapy but will touch base with his oncologist after CT head and labs. Doubt infectious etiology with rapid recovery and no fever. Lower suspicion for CVA without focal deficits. Given time of onset after new chemo and then resolving by suspicion for complex partial seizure is very low.   08:15 PM  Spoke with Dr. Delton Coombes regarding the patient's presentation.  CT imaging of the head is unremarkable.  Patient has returned to baseline.  Labs are reassuring.  EKG and UA are pending.  Dr. Delton Coombes can arrange outpatient MRI.  Will give contact information for local neurology but given he is back at baseline do not plan on additional imaging or work-up tonight.   UA negative. Plan for Neurology and Oncology follow up as scheduled.  ____________________________________________  FINAL CLINICAL IMPRESSION(S) / ED DIAGNOSES  Final diagnoses:  Transient alteration of awareness    MEDICATIONS GIVEN DURING THIS VISIT:  Medications  sodium chloride 0.9 % bolus 1,000 mL (0 mLs Intravenous Stopped 06/26/19 2027)    Note:  This document was prepared using Dragon voice recognition software and may include unintentional dictation errors.  Nanda Quinton, MD, [facility] Emergency Medicine    Christino Mcglinchey, [facility], MD 06/26/19 [id number]

## 2019-06-26 NOTE — Progress Notes (Signed)
Troy Smith, Troy Smith [id number]   CLINIC:  Medical Oncology/Hematology  PCP:  Troy Smith, [place] [address] [phone]   REASON FOR VISIT: Follow-up for sigmoid colon Smith mets to the liver  CURRENT THERAPY: Xeloda and radiation.  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 Focal narrowing and wall thickening of the sigmoid colon is noted in this area most likely due to inflammation, but neoplasm cannot be excluded, and sigmoidoscopy is recommended for further evaluation.   03/20/2016 Procedure   Colonoscopy by Dr. Laural Golden- Malignant partially obstructing tumor in the distal sigmoid colon. Biopsied. - Incomplete exam.   03/23/2016 Pathology Results   Colon, biopsy, sigmoid mass - ADENOCARCINOMA.   03/23/2016 Procedure   Low anterior resection with transanal anastomosis   03/26/2016 Pathology Results   Colon, segmental resection for tumor, sigmoid - INVASIVE WELL DIFFERENTIATED ADENOCARCINOMA, SPANNING 5 CM IN GREATEST DIMENSION. - TUMOR INVADES THROUGH MUSCULARIS PROPRIA TO INVOLVE SUBSEROSAL SOFT TISSUES. - UNDERLYING MARKED ACUTE AND CHRONIC INFLAMMATION WITH GIANT CELL REACTION AND ABSCESS FORMATION EXTENDING TO THE SEROSAL SURFACE AND INVOLVING THE MESENTERIC TISSUE. - MARGINS ARE NEGATIVE FOR TUMOR. - FOURTEEN BENIGN LYMPH NODES WHICH DEMONSTRATE VARIABLE INVOLVEMENT WITH ACUTE AND CHRONIC INFLAMMATION WITH ASSOCIATED GIANT CELLS WITHOUT TUMOR IDENTIFIED (0/14).   04/08/2016 Smith Staging   Smith Staging Adenocarcinoma of sigmoid colon [facility]) Staging form: Colon and Rectum, AJCC 8th Edition - Pathologic stage from 04/01/2016: Stage IIA (pT3, pN0, cM0) - Signed by Troy Cancer, PA-C on 04/01/2016    07/09/2016 Imaging   CT abd/pelvis- No evidence of  metastatic disease or other acute findings.  Bilateral nonobstructing renal calculi and mild bilateral renal parenchymal scarring.  Stable mildly enlarged prostate gland and findings of chronic bladder outlet obstruction.   08/16/2018 -  Chemotherapy   The patient had palonosetron (ALOXI) injection 0.25 mg, 0.25 mg, Intravenous,  Once, 12 of 12 cycles Administration: 0.25 mg (08/16/2018), 0.25 mg (09/07/2018), 0.25 mg (10/19/2018), 0.25 mg (11/02/2018), 0.25 mg (11/16/2018), 0.25 mg (11/30/2018), 0.25 mg (03/21/2019), 0.25 mg (04/04/2019), 0.25 mg (04/18/2019), 0.25 mg (05/02/2019), 0.25 mg (05/17/2019), 0.25 mg (05/31/2019) pegfilgrastim (NEULASTA) injection 6 mg, 6 mg, Subcutaneous, Once, 12 of 12 cycles Administration: 6 mg (08/18/2018), 6 mg (09/09/2018), 6 mg (10/21/2018), 6 mg (11/04/2018), 6 mg (11/18/2018), 6 mg (12/02/2018), 6 mg (03/23/2019), 6 mg (04/06/2019), 6 mg (04/20/2019), 6 mg (05/04/2019), 6 mg (05/19/2019), 6 mg (06/02/2019) irinotecan (CAMPTOSAR) 380 mg in sodium chloride 0.9 % 500 mL chemo infusion, 165 mg/m2 = 380 mg, Intravenous,  Once, 1 of 1 cycle Administration: 380 mg (08/16/2018) leucovorin 900 mg in dextrose 5 % 250 mL infusion, 400 mg/m2 = 900 mg, Intravenous,  Once, 12 of 12 cycles Administration: 900 mg (08/16/2018), 900 mg (09/07/2018), 900 mg (10/19/2018), 900 mg (11/02/2018), 900 mg (11/16/2018), 800 mg (11/30/2018), 900 mg (03/21/2019), 900 mg (04/04/2019), 900 mg (04/18/2019), 900 mg (05/02/2019), 900 mg (05/17/2019), 800 mg (05/31/2019) oxaliplatin (ELOXATIN) 190 mg in dextrose 5 % 500 mL chemo infusion, 85 mg/m2 = 190 mg, Intravenous,  Once, 12 of 12 cycles Dose modification: 68 mg/m2 (80 % of original dose 85 mg/m2, Cycle 2, Reason: Provider Judgment), 68 mg/m2 (80 % of original dose 85 mg/m2, Cycle 3, Reason: Provider Judgment) Administration: 190 mg (08/16/2018), 150 mg (09/07/2018), 150 mg (10/19/2018), 150 mg (11/16/2018), 150 mg (11/02/2018), 135  mg (11/30/2018), 150 mg (03/21/2019), 150 mg (04/04/2019), 150  mg (04/18/2019), 150 mg (05/02/2019), 150 mg (05/17/2019), 130 mg (05/31/2019) fluorouracil (ADRUCIL) chemo injection 700 mg, 320 mg/m2 = 700 mg (100 % of original dose 320 mg/m2), Intravenous,  Once, 9 of 9 cycles Dose modification: 320 mg/m2 (original dose 320 mg/m2, Cycle 4) Administration: 700 mg (11/02/2018), 700 mg (11/16/2018), 650 mg (11/30/2018), 700 mg (03/21/2019), 700 mg (04/04/2019), 700 mg (04/18/2019), 700 mg (05/02/2019), 700 mg (05/17/2019), 700 mg (05/31/2019) fosaprepitant (EMEND) 150 mg, dexamethasone (DECADRON) 12 mg in sodium chloride 0.9 % 145 mL IVPB, , Intravenous,  Once, 12 of 12 cycles Administration:  (08/16/2018),  (09/07/2018),  (10/19/2018),  (11/02/2018),  (11/16/2018),  (11/30/2018),  (03/21/2019),  (04/04/2019),  (04/18/2019),  (05/02/2019),  (05/17/2019),  (05/31/2019) fluorouracil (ADRUCIL) 5,400 mg in sodium chloride 0.9 % 142 mL chemo infusion, 2,400 mg/m2 = 5,400 mg, Intravenous, 1 Day/Dose, 12 of 12 cycles Dose modification: 1,920 mg/m2 (80 % of original dose 2,400 mg/m2, Cycle 3, Reason: Provider Judgment) Administration: 5,400 mg (08/16/2018), 5,400 mg (09/07/2018), 4,300 mg (10/19/2018), 4,300 mg (11/02/2018), 4,300 mg (11/16/2018), 3,850 mg (11/30/2018), 4,300 mg (03/21/2019), 4,300 mg (04/04/2019), 4,300 mg (04/18/2019), 4,300 mg (05/02/2019), 4,300 mg (05/17/2019), 3,850 mg (05/31/2019) bevacizumab-awwb (MVASI) 500 mg in sodium chloride 0.9 % 100 mL chemo infusion, 5 mg/kg = 500 mg (100 % of original dose 5 mg/kg), Intravenous,  Once, 6 of 6 cycles Dose modification: 5 mg/kg (original dose 5 mg/kg, Cycle 1), 5 mg/kg (original dose 5 mg/kg, Cycle 2) Administration: 500 mg (08/16/2018), 500 mg (09/07/2018), 500 mg (10/19/2018), 500 mg (11/02/2018), 450 mg (11/16/2018), 400 mg (11/30/2018)  for chemotherapy treatment.       Smith STAGING: Smith Staging Adenocarcinoma of sigmoid colon [facility]) Staging form: Colon and Rectum, AJCC 8th Edition - Pathologic stage from 04/01/2016: Stage IIA (pT3, pN0, cM0) - Signed  by Troy Cancer, PA-C on 04/01/2016    INTERVAL HISTORY:  Troy Smith 64 y.o. male seen for follow-up of metastatic rectal Smith to the liver.  He completed 6 cycles of chemotherapy after surgery.  However he is surgical margins were positive.  Hence we have recommended chemoradiation therapy.  He has obtained Xeloda tablets.  He has taken 3 pills this morning.  Appetite is 75%.  Energy levels are 100%.  No pain reported.  Has chronic diarrhea which is controlled with Lomotil 2 to 3 tablets/day.  Numbness in the fingertips is stable.  Denies any gout joint pains.  REVIEW OF SYSTEMS:  Review of Systems  Gastrointestinal: Positive for diarrhea.  Neurological: Positive for numbness.  All other systems reviewed and are negative.    PAST MEDICAL/SURGICAL HISTORY:  Past Medical History:  Diagnosis Date  . 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 Carpal tunnel syndrome 05/02/2014   Bilateral  . Cervical disc disorder with radiculopathy of cervical region 05/02/2014   Left C8  . Diabetes mellitus without complication ([place])   . Hypertension   . Port-A-Cath in place 08/10/2018   Past Surgical History:  Procedure Laterality Date  . BIOPSY  03/20/2016   Procedure: BIOPSY;  Surgeon: Rogene Houston, MD;  Location[facility] END[facility];  Service: Endoscopy;;  colon  . BOWEL RESECTION N/A 03/23/2016   Procedure: LOW ANTERIOR RESECTION;  Surgeon: Aviva Signs, MD;  Location: [facility] ORS;  Service: General;  Laterality: N/A;  . COLON SURGERY  01/2019   Done at [place] N/A 03/20/2016   Procedure: COLONOSCOPY;  Surgeon: Mechele Dawley  Laural Golden, MD;  Location: [facility] ENDO SUITE;  Service: Endoscopy;  Laterality: N/A;  730  . COLONOSCOPY N/A 07/10/2016   Procedure: COLONOSCOPY;  Surgeon: Rogene Houston, MD;  Location: [facility] ENDO SUITE;  Service: Endoscopy;  Laterality: N/A;  1040  . NO PAST SURGERIES    . POLYPECTOMY  07/10/2016   Procedure: POLYPECTOMY;  Surgeon: Rogene Houston, MD;  Location: [facility]  ENDO SUITE;  Service: Endoscopy;;  colon  . PORTACATH PLACEMENT Left 08/05/2018   Procedure: INSERTION PORT-A-CATH;  Surgeon: Aviva Signs, MD;  Location: [facility] ORS;  Service: General;  Laterality: Left;  . TONSILLECTOMY       SOCIAL HISTORY:  Social History   Socioeconomic History  . Marital status: Married    Spouse name: Not on file  . Number of children: Not on file  . Years of education: Not on file  . Highest education level: Not on file  Occupational History  . Not on file  Tobacco Use  . Smoking status: Never Smoker  . Smokeless tobacco: Never Used  Substance and Sexual Activity  . Alcohol use: Yes    Comment: rarely  . Drug use: No  . Sexual activity: Not on file  Other Topics Concern  . Not on file  Social History Narrative  . Not on file   Social Determinants of Health   Financial Resource Strain:   . Difficulty of Paying Living Expenses:   Food Insecurity:   . Worried About Charity fundraiser in the Last Year:   . Arboriculturist in the Last Year:   Transportation Needs:   . Film/video editor (Medical):   Marland Kitchen Lack of Transportation (Non-Medical):   Physical Activity:   . Days of Exercise per Week:   . Minutes of Exercise per Session:   Stress:   . Feeling of Stress :   Social Connections:   . Frequency of Communication with Friends and Family:   . Frequency of Social Gatherings with Friends and Family:   . Attends Religious Services:   . Active Member of Clubs or Organizations:   . Attends Archivist Meetings:   Marland Kitchen Marital Status:   Intimate Partner Violence:   . Fear of Current or Ex-Partner:   . Emotionally Abused:   Marland Kitchen Physically Abused:   . Sexually Abused:     FAMILY HISTORY:  Family History  Problem Relation Age of Onset  . COPD Mother   . Melanoma Father     CURRENT MEDICATIONS:  Outpatient Encounter Medications as of 06/26/2019  Medication Sig  . amLODipine (NORVASC) 10 MG tablet Take 10 mg by mouth daily.  .  Bevacizumab (AVASTIN IV) Inject into the vein every 14 (fourteen) days.  . capecitabine (XELODA) 500 MG tablet Take 3 tablets (1,500 mg total) by mouth 2 (two) times daily after a meal. Take Monday through Friday. Take only on days of radiation.  Marland Kitchen dextrose 5 % SOLN 1,000 mL with fluorouracil 5 GM/100ML SOLN Inject into the vein over 48 hr.  . DIPHENHIST 12.5 MG/5ML liquid SWISH AND SPIT/SWALLOWOONE TEASPOONFUL BY MOUTH 4 TIMES DAILY AS NEEEDE FOR MOUTH PAIN.  . diphenoxylate-atropine (LOMOTIL) 2.5-0.025 MG tablet Take 1 tablet by mouth 4 (four) times daily as needed for diarrhea or loose stools. Take 2 tablets at the first onset of diarrhea followed by 1 tablet after every watery bowel movement.  . donepezil (ARICEPT) 10 MG tablet TAKE (1) TABLET BY MOUTH AT BEDTIME.  Marland Kitchen FLUOROURACIL IV Inject  into the vein every 14 (fourteen) days.  . insulin glargine (LANTUS) 100 UNIT/ML injection Inject 30 Units into the skin at bedtime.   . IRINOTECAN HCL IV Inject into the vein every 14 (fourteen) days.  Marland Kitchen LEUCOVORIN CALCIUM IV Inject into the vein every 14 (fourteen) days.  . magic mouthwash w/lidocaine SOLN Take 5 mLs by mouth 4 (four) times daily as needed for mouth pain.  . metoprolol succinate (TOPROL-XL) 50 MG 24 hr tablet Take 50 mg by mouth daily. Take with or immediately following a meal.  . MITIGARE 0.6 MG CAPS Take 1 capsule by mouth as needed.   . OXALIPLATIN IV Inject into the vein every 14 (fourteen) days.  . prochlorperazine (COMPAZINE) 10 MG tablet Take 1 tablet (10 mg total) by mouth every 6 (six) hours as needed (Nausea or vomiting).  . QC ANTACID/ANTI-GAS 200-200-20 MG/5ML suspension SMARTSIG:1 Teaspoon By Mouth 4 Times Daily PRN  . scopolamine (TRANSDERM-SCOP) 1 MG/3DAYS Place 1 patch (1.5 mg total) onto the skin every 3 (three) days.  . [DISCONTINUED] diphenoxylate-atropine (LOMOTIL) 2.5-0.025 MG tablet Take 1 tablet by mouth 4 (four) times daily as needed for diarrhea or loose stools.  .  [DISCONTINUED] diphenoxylate-atropine (LOMOTIL) 2.5-0.025 MG tablet Take 1 tablet by mouth 4 (four) times daily as needed for diarrhea or loose stools.  . [DISCONTINUED] diphenoxylate-atropine (LOMOTIL) 2.5-0.025 MG tablet Take 1 tablet by mouth 4 (four) times daily as needed for diarrhea or loose stools.  Marland Kitchen allopurinol (ZYLOPRIM) 300 MG tablet TAKE ONE TABLET BY MOUTH ONCE DAILY. (Patient not taking: Reported on 06/26/2019)  . febuxostat (ULORIC) 40 MG tablet Take 1 tablet (40 mg total) by mouth daily.  Marland Kitchen HYDROcodone-acetaminophen (NORCO) 5-325 MG tablet Take 1 tablet by mouth every 4 (four) hours as needed for moderate pain. (Patient not taking: Reported on 06/26/2019)  . lidocaine (XYLOCAINE) 2 % solution SMARTSIG:1 Teaspoon By Mouth 4 Times Daily PRN  . lidocaine-prilocaine (EMLA) cream Apply a small amount to port a cath site and cover with plastic wrap 1 hour prior to chemotherapy appointments (Patient not taking: Reported on 06/26/2019)  . loperamide (IMODIUM A-D) 2 MG tablet Take 2 at onset of diarrhea, then 1 after each watery bowel movement. (Patient not taking: Reported on 06/26/2019)   No facility-administered encounter medications on file as of 06/26/2019.    ALLERGIES:  Allergies  Allergen Reactions  . Penicillin G Nausea And Vomiting     PHYSICAL EXAM:  ECOG Performance status: 1  Vital signs: Blood pressure is 125/82.  Pulse rate is 79.  Respirate is 18.  Temperature 98.  Saturations 100.  Physical Exam Cardiovascular:     Rate and Rhythm: Normal rate and regular rhythm.     Heart sounds: Normal heart sounds.  Pulmonary:     Effort: Pulmonary effort is normal.     Breath sounds: Normal breath sounds.  Abdominal:     General: There is no distension.     Palpations: Abdomen is soft. There is no mass.  Musculoskeletal:        General: Normal range of motion.  Skin:    General: Skin is warm and dry.  Neurological:     Mental Status: He is alert and oriented to person,  place, and time. Mental status is at baseline.  Psychiatric:        Mood and Affect: Mood normal.        Behavior: Behavior normal.        Thought Content: Thought content normal.  Judgment: Judgment normal.      LABORATORY DATA:  I have reviewed the labs as listed.  CBC    Component Value Date/Time   WBC 6.7 06/26/2019 1758   RBC 3.35 (L) 06/26/2019 1758   HGB 9.6 (L) 06/26/2019 1758   HCT 30.9 (L) 06/26/2019 1758   PLT 149 (L) 06/26/2019 1758   MCV 92.2 06/26/2019 1758   MCH 28.7 06/26/2019 1758   MCHC 31.1 06/26/2019 1758   RDW 19.0 (H) 06/26/2019 1758   LYMPHSABS 1.1 06/26/2019 1758   MONOABS 0.4 06/26/2019 1758   EOSABS 0.1 06/26/2019 1758   BASOSABS 0.0 06/26/2019 1758   CMP Latest Ref Rng & Units 06/26/2019 06/26/2019 05/31/2019  Glucose 70 - 99 mg/dL 113(H) 160(H) 137(H)  BUN 8 - 23 mg/dL _0 Creatinine 0.61 - 1.24 mg/dL 0.93 0.90 0.94  Sodium 135 - 145 mmol/L 141 140 139  Potassium 3.5 - 5.1 mmol/L 3.6 4.2 3.5  Chloride 98 - 111 mmol/L 107 105 105  CO2 22 - 32 mmol/L _1 Calcium 8.9 - 10.3 mg/dL 9.4 9.5 9.1  Total Protein 6.5 - 8.1 g/dL 6.6 6.2(L) 6.1(L)  Total Bilirubin 0.3 - 1.2 mg/dL 0.7 0.8 0.5  Alkaline Phos 38 - 126 U/L 69 62 87  AST 15 - 41 U/L 12(L) 13(L) 11(L)  ALT 0 - 44 U/L _2 I have reviewed scans.  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  Metastatic sigmoid colon Smith to the liver: -MS-stable, NRAS wild-type. -12 cycles of FOLFOX completed on 05/31/2019. -He is starting radiation today for circumferential positive margin at the rectum. -We talked about Xeloda 1500 mg twice daily Monday through Friday throughout the course of radiation.  We talked about side effects including but not limited to hand-foot skin reaction, diarrhea, cytopenias and infections. -He has taken the pills this morning.  He will go this afternoon to start radiation.  I will reevaluate him in 1 week with labs.  2.  Memory  loss: -He had deterioration in the memory with the start of treatments.  I do not know his status prior to the start of treatments. -He was started on Aricept 10 mg daily which seemed to help a little bit.  His brain scan was negative. -We will make referral to neurology.  3.  Gout: -He has gout of his fingers and right knee.  He was taking allopurinol.  However it interacts with Xeloda. -We will discontinue allopurinol.  We will start him on Uloric 40 mg daily.  4.  Diarrhea: -He has on and off diarrhea.  I have warned the possibility of diarrhea getting worse with Xeloda.  He was instructed to take Lomotil on a regular basis.  5.  Hypomagnesemia: -Continue home magnesium supplements.  Addendum: -I have received a phone call Monday evening that the patient was last in the [facility] and was wondering.  He was later found in a different building.  He was taken to the ER.  The ER physician contacted me that the patient has come back to his normal mental status while he was in the ER.  CT of the brain was negative.  I have recommended outpatient MRI if he is stable to be discharged.  I have recommended holding his Xeloda.   Orders placed this encounter:  No orders of the defined types were placed in this encounter.    Derek Jack, MD [facility] [phone]  

## 2019-06-26 NOTE — Progress Notes (Signed)
I received a call from patient's wife concerning Troy Smith today. She states that he was missing for over an hour after radiation treatment today. She states that he was found wondering around the parking lot some good bit away from where he was supposed to be. He didn't know who he was or where he was.   I spoke with Dr. Delton Coombes and he advised for him to stop taking the Xeloda.  He also wants patient to report the ER for evaluation of altered mental status.    I was able to contact his wife and she reports that they are in the car on the way back in to [place]. I have advised them to go straight to the ER.  She verbalizes understanding.   I have called and spoke with Magda Paganini, RN charge to give report.

## 2019-06-26 NOTE — Telephone Encounter (Signed)
Oral Oncology Patient Advocate Encounter  Per Candice at [facility], patient had a $0.00 copay and Xeloda was delivered on 06/24/19.

## 2019-06-27 ENCOUNTER — Other Ambulatory Visit (HOSPITAL_COMMUNITY): Payer: Self-pay | Admitting: *Deleted

## 2019-06-27 ENCOUNTER — Other Ambulatory Visit: Payer: Self-pay

## 2019-06-27 ENCOUNTER — Ambulatory Visit
Admission: RE | Admit: 2019-06-27 | Discharge: 2019-06-27 | Disposition: A | Discharge status: Home / Self Care | Payer: 59 | Source: Ambulatory Visit | Attending: Radiation Oncology | Admitting: Radiation Oncology

## 2019-06-27 ENCOUNTER — Encounter (INDEPENDENT_AMBULATORY_CARE_PROVIDER_SITE_OTHER): Payer: Self-pay | Admitting: *Deleted

## 2019-06-27 DIAGNOSIS — Z510 Encounter for antineoplastic radiation therapy: Secondary | ICD-10-CM | POA: Diagnosis not present

## 2019-06-28 ENCOUNTER — Telehealth: Payer: Self-pay | Admitting: Radiation Oncology

## 2019-06-28 ENCOUNTER — Ambulatory Visit
Admission: RE | Admit: 2019-06-28 | Discharge: 2019-06-28 | Disposition: A | Discharge status: Home / Self Care | Payer: 59 | Source: Ambulatory Visit | Attending: Radiation Oncology | Admitting: Radiation Oncology

## 2019-06-28 ENCOUNTER — Other Ambulatory Visit: Payer: Self-pay

## 2019-06-28 DIAGNOSIS — Z510 Encounter for antineoplastic radiation therapy: Secondary | ICD-10-CM | POA: Diagnosis not present

## 2019-06-28 LAB — URINE CULTURE: Culture: NO GROWTH

## 2019-06-28 NOTE — Telephone Encounter (Signed)
This entry is delayed but on Monday this week, the patient was scheduled to begin his first course of radiotherapy to the pelvis.  He is being treated for a positive radial margin following resection of a sigmoid colon cancer.  The patient proceeded with treatment, his wife had been in the lobby upstairs, and following his treatment apparently did not return up to the main lobby.  An hour or so after he should have been back, or staff was notified, that he had not returned to the lobby.  This prompted further investigation with the staff looking for the patient.  Ultimately he was found about 30 minutes later at [facility] at the entrance near [facility].  The patient was apparently confused and made several statements to staff and his wife indicating that he was not aware of what was going on.  He was taken to the emergency room, at which time he did not recall where he was, upon presentation to the emergency room, he was feeling much better and did not recall having any headache weakness numbness or visual change during the incident.  CT of the head without contrast was negative for any acute findings.  He will follow up with Dr. Delton Coombes further to discuss next steps.  I had called his wife that afternoon trying to follow-up after the incident to ensure that he was doing better as I was not notified of this during the event but rather afterwards.  She called me back, but left a message, and I tried calling her back again today but could not reach her.  I left a voicemail encouraging her to call if she any questions or concerns that we can answer, the plan that leadership has laid out for this patient is that she will accompany him for treatment, I have not heard any further feedback from staff as he was successfully treated both yesterday and today.

## 2019-06-29 ENCOUNTER — Other Ambulatory Visit: Payer: Self-pay

## 2019-06-29 ENCOUNTER — Ambulatory Visit
Admission: RE | Admit: 2019-06-29 | Discharge: 2019-06-29 | Disposition: A | Discharge status: Home / Self Care | Payer: 59 | Source: Ambulatory Visit | Attending: Radiation Oncology | Admitting: Radiation Oncology

## 2019-06-29 DIAGNOSIS — Z510 Encounter for antineoplastic radiation therapy: Secondary | ICD-10-CM | POA: Diagnosis not present

## 2019-06-30 ENCOUNTER — Ambulatory Visit
Admission: RE | Admit: 2019-06-30 | Discharge: 2019-06-30 | Disposition: A | Discharge status: Home / Self Care | Payer: 59 | Source: Ambulatory Visit | Attending: Radiation Oncology | Admitting: Radiation Oncology

## 2019-06-30 ENCOUNTER — Other Ambulatory Visit: Payer: Self-pay

## 2019-06-30 DIAGNOSIS — Z510 Encounter for antineoplastic radiation therapy: Secondary | ICD-10-CM | POA: Diagnosis not present

## 2019-06-30 NOTE — Progress Notes (Signed)
Pt here for patient teaching.  Pt given Radiation and You booklet.  Reviewed areas of pertinence such as diarrhea, fatigue, hair loss, nausea and vomiting, sexual and fertility changes, skin changes and urinary and bladder changes . Pt able to give teach back of to pat skin, use unscented/gentle soap, use baby wipes, have Imodium on hand, drink plenty of water and sitz bath,avoid applying anything to skin within 4 hours of treatment. Pt verbalizes understanding of information given and will contact nursing with any questions or concerns.     Camelle Henkels M. Cordelia Bessinger RN, BSN      

## 2019-06-30 NOTE — Assessment & Plan Note (Signed)
1.  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 -12 cycles of FOLFOX completed on 05/31/2019. -He is starting radiation today for circumferential positive margin at the rectum. -We talked about Xeloda 1500 mg twice daily Monday through Friday throughout the course of radiation.  We talked about side effects including but not limited to hand-foot skin reaction, diarrhea, cytopenias and infections. -He has taken the pills this morning.  He will go this afternoon to start radiation.  I will reevaluate him in 1 week with labs.  2.  Memory loss: -He had deterioration in the memory with the start of treatments.  I do not know his status prior to the start of treatments. -He was started on Aricept 10 mg daily which seemed to help a little bit.  His brain scan was negative. -We will make referral to neurology.  3.  Gout: -He has gout of his fingers and right knee.  He was taking allopurinol.  However it interacts with Xeloda. -We will discontinue allopurinol.  We will start him on Uloric 40 mg daily.  4.  Diarrhea: -He has on and off diarrhea.  I have warned the possibility of diarrhea getting worse with Xeloda.  He was instructed to take Lomotil on a regular basis.  5.  Hypomagnesemia: -Continue home magnesium supplements.

## 2019-07-03 ENCOUNTER — Other Ambulatory Visit: Payer: Self-pay

## 2019-07-03 ENCOUNTER — Ambulatory Visit
Admission: RE | Admit: 2019-07-03 | Discharge: 2019-07-03 | Disposition: A | Discharge status: Home / Self Care | Payer: 59 | Source: Ambulatory Visit | Attending: Radiation Oncology | Admitting: Radiation Oncology

## 2019-07-03 ENCOUNTER — Inpatient Hospital Stay (HOSPITAL_BASED_OUTPATIENT_CLINIC_OR_DEPARTMENT_OTHER): Payer: 59 | Admitting: Hematology

## 2019-07-03 ENCOUNTER — Inpatient Hospital Stay (HOSPITAL_COMMUNITY): Payer: 59

## 2019-07-03 ENCOUNTER — Encounter (HOSPITAL_COMMUNITY): Payer: Self-pay | Admitting: Hematology

## 2019-07-03 DIAGNOSIS — R413 Other amnesia: Secondary | ICD-10-CM | POA: Diagnosis not present

## 2019-07-03 DIAGNOSIS — Z794 Long term (current) use of insulin: Secondary | ICD-10-CM | POA: Diagnosis not present

## 2019-07-03 DIAGNOSIS — E119 Type 2 diabetes mellitus without complications: Secondary | ICD-10-CM | POA: Diagnosis not present

## 2019-07-03 DIAGNOSIS — M109 Gout, unspecified: Secondary | ICD-10-CM | POA: Diagnosis not present

## 2019-07-03 DIAGNOSIS — C187 Malignant neoplasm of sigmoid colon: Secondary | ICD-10-CM

## 2019-07-03 DIAGNOSIS — Z79899 Other long term (current) drug therapy: Secondary | ICD-10-CM | POA: Diagnosis not present

## 2019-07-03 DIAGNOSIS — I10 Essential (primary) hypertension: Secondary | ICD-10-CM | POA: Diagnosis not present

## 2019-07-03 DIAGNOSIS — Z510 Encounter for antineoplastic radiation therapy: Secondary | ICD-10-CM | POA: Diagnosis not present

## 2019-07-03 DIAGNOSIS — C787 Secondary malignant neoplasm of liver and intrahepatic bile duct: Secondary | ICD-10-CM | POA: Diagnosis not present

## 2019-07-03 LAB — CBC WITH DIFFERENTIAL/PLATELET
Abs Immature Granulocytes: 0.01 10*3/uL (ref 0.00–0.07)
Basophils Absolute: 0 10*3/uL (ref 0.0–0.1)
Basophils Relative: 0 %
Eosinophils Absolute: 0.1 10*3/uL (ref 0.0–0.5)
Eosinophils Relative: 3 %
HCT: 33.5 % — ABNORMAL LOW (ref 39.0–52.0)
Hemoglobin: 9.9 g/dL — ABNORMAL LOW (ref 13.0–17.0)
Immature Granulocytes: 0 %
Lymphocytes Relative: 22 %
Lymphs Abs: 0.9 10*3/uL (ref 0.7–4.0)
MCH: 27.8 pg (ref 26.0–34.0)
MCHC: 29.6 g/dL — ABNORMAL LOW (ref 30.0–36.0)
MCV: 94.1 fL (ref 80.0–100.0)
Monocytes Absolute: 0.3 10*3/uL (ref 0.1–1.0)
Monocytes Relative: 8 %
Neutro Abs: 2.6 10*3/uL (ref 1.7–7.7)
Neutrophils Relative %: 67 %
Platelets: 144 10*3/uL — ABNORMAL LOW (ref 150–400)
RBC: 3.56 MIL/uL — ABNORMAL LOW (ref 4.22–5.81)
RDW: 17.5 % — ABNORMAL HIGH (ref 11.5–15.5)
WBC: 4 10*3/uL (ref 4.0–10.5)
nRBC: 0 % (ref 0.0–0.2)

## 2019-07-03 LAB — COMPREHENSIVE METABOLIC PANEL
ALT: 9 U/L (ref 0–44)
AST: 12 U/L — ABNORMAL LOW (ref 15–41)
Albumin: 3.8 g/dL (ref 3.5–5.0)
Alkaline Phosphatase: 57 U/L (ref 38–126)
Anion gap: 8 (ref 5–15)
BUN: 19 mg/dL (ref 8–23)
CO2: 27 mmol/L (ref 22–32)
Calcium: 9.7 mg/dL (ref 8.9–10.3)
Chloride: 105 mmol/L (ref 98–111)
Creatinine, Ser: 1.12 mg/dL (ref 0.61–1.24)
GFR calc Af Amer: >60 mL/min (ref >60)
GFR calc non Af Amer: >60 mL/min (ref >60)
Glucose, Bld: 127 mg/dL — ABNORMAL HIGH (ref 70–99)
Potassium: 4.8 mmol/L (ref 3.5–5.1)
Sodium: 140 mmol/L (ref 135–145)
Total Bilirubin: 1.1 mg/dL (ref 0.3–1.2)
Total Protein: 6.3 g/dL — ABNORMAL LOW (ref 6.5–8.1)

## 2019-07-03 LAB — MAGNESIUM: Magnesium: 1.8 mg/dL (ref 1.7–2.4)

## 2019-07-03 NOTE — Patient Instructions (Signed)
[facility] at [facility] Discharge Instructions  You were seen today by Dr. Delton Coombes. He went over your recent lab and scan results. Start taking Xeloda 2 pills twice a day. Start taking the diarrhea medication 2 pills three times a day. Be sure to wear shoes at all times even at home. Wear thick socks and use moisturizing cream. Wear gloves to protect your hands as well. He will see you back in 1 week for labs and follow up.   Thank you for choosing [place] at [facility] to provide your oncology and hematology care.  To afford each patient quality time with our provider, please arrive at least 15 minutes before your scheduled appointment time.   If you have a lab appointment with the [facility] please come in thru the  Main Entrance and check in at the main information desk  You need to re-schedule your appointment should you arrive 10 or more minutes late.  We strive to give you quality time with our providers, and arriving late affects you and other patients whose appointments are after yours.  Also, if you no show three or more times for appointments you may be dismissed from the clinic at the providers discretion.     Again, thank you for choosing [facility].  Our hope is that these requests will decrease the amount of time that you wait before being seen by our physicians.       _____________________________________________________________  Should you have questions after your visit to [facility], please contact our office at [phone] between the hours of 8:00 a.m. and 4:30 p.m.  Voicemails left after 4:00 p.m. will not be returned until the following business day.  For prescription refill requests, have your pharmacy contact our office and allow 72 hours.    Cancer Center Support Programs:   > Cancer Support Group  2nd Tuesday of the month 1pm-2pm, Journey Room

## 2019-07-03 NOTE — Assessment & Plan Note (Signed)
1.  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 -We have cut back on Xeloda to 1 tablet twice daily as he had episode of confusion on day 1. -So far he has tolerated Xeloda very well.  He noticed slight increase in diarrhea mostly in the evenings. -I have told him to increase Xeloda to 2 tablets twice daily.  I will plan to reevaluate him in 1 week.  I have reviewed labs from today which showed grossly normal LFTs and white count.  2.  Diarrhea: -He is having diarrhea predominantly in the evenings.  He is taking Lomotil 2 tablets twice daily. -I have recommended increasing Lomotil to 2 tablets 3 times a day.  3.  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  We have made a referral to neurology.  4.  Gout: -We have discontinued allopurinol.  We have started him on Uloric 40 mg daily.  5.  Hypomagnesemia: Magnesium today is 1.8.  Continue home supplements.

## 2019-07-03 NOTE — Progress Notes (Signed)
[address] [place], [place] [address]   CLINIC:  Medical Oncology/Hematology  PCP:  [name], [place] [address] [phone]   REASON FOR VISIT: Follow-up for sigmoid colon cancer mets to the liver  CURRENT THERAPY: Xeloda and radiation.  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 Focal narrowing and wall thickening of the sigmoid colon is noted in this area most likely due to inflammation, but neoplasm cannot be excluded, and sigmoidoscopy is recommended for further evaluation.   03/20/2016 Procedure   Colonoscopy by Dr. Laural Golden- Malignant partially obstructing tumor in the distal sigmoid colon. Biopsied. - Incomplete exam.   03/23/2016 Pathology Results   Colon, biopsy, sigmoid mass - ADENOCARCINOMA.   03/23/2016 Procedure   Low anterior resection with transanal anastomosis   03/26/2016 Pathology Results   Colon, segmental resection for tumor, sigmoid - INVASIVE WELL DIFFERENTIATED ADENOCARCINOMA, SPANNING 5 CM IN GREATEST DIMENSION. - TUMOR INVADES THROUGH MUSCULARIS PROPRIA TO INVOLVE SUBSEROSAL SOFT TISSUES. - UNDERLYING MARKED ACUTE AND CHRONIC INFLAMMATION WITH GIANT CELL REACTION AND ABSCESS FORMATION EXTENDING TO THE SEROSAL SURFACE AND INVOLVING THE MESENTERIC TISSUE. - MARGINS ARE NEGATIVE FOR TUMOR. - FOURTEEN BENIGN LYMPH NODES WHICH DEMONSTRATE VARIABLE INVOLVEMENT WITH ACUTE AND CHRONIC INFLAMMATION WITH ASSOCIATED GIANT CELLS WITHOUT TUMOR IDENTIFIED (0/14).   04/08/2016 Cancer Staging   Cancer Staging Adenocarcinoma of sigmoid colon [facility]) Staging form: Colon and Rectum, AJCC 8th Edition - Pathologic stage from 04/01/2016: Stage IIA (pT3, pN0, cM0) - Signed by Baird Cancer, PA-C on 04/01/2016    07/09/2016 Imaging   CT abd/pelvis- No evidence of  metastatic disease or other acute findings.  Bilateral nonobstructing renal calculi and mild bilateral renal parenchymal scarring.  Stable mildly enlarged prostate gland and findings of chronic bladder outlet obstruction.   08/16/2018 -  Chemotherapy   The patient had palonosetron (ALOXI) injection 0.25 mg, 0.25 mg, Intravenous,  Once, 12 of 12 cycles Administration: 0.25 mg (08/16/2018), 0.25 mg (09/07/2018), 0.25 mg (10/19/2018), 0.25 mg (11/02/2018), 0.25 mg (11/16/2018), 0.25 mg (11/30/2018), 0.25 mg (03/21/2019), 0.25 mg (04/04/2019), 0.25 mg (04/18/2019), 0.25 mg (05/02/2019), 0.25 mg (05/17/2019), 0.25 mg (05/31/2019) pegfilgrastim (NEULASTA) injection 6 mg, 6 mg, Subcutaneous, Once, 12 of 12 cycles Administration: 6 mg (08/18/2018), 6 mg (09/09/2018), 6 mg (10/21/2018), 6 mg (11/04/2018), 6 mg (11/18/2018), 6 mg (12/02/2018), 6 mg (03/23/2019), 6 mg (04/06/2019), 6 mg (04/20/2019), 6 mg (05/04/2019), 6 mg (05/19/2019), 6 mg (06/02/2019) irinotecan (CAMPTOSAR) 380 mg in sodium chloride 0.9 % 500 mL chemo infusion, 165 mg/m2 = 380 mg, Intravenous,  Once, 1 of 1 cycle Administration: 380 mg (08/16/2018) leucovorin 900 mg in dextrose 5 % 250 mL infusion, 400 mg/m2 = 900 mg, Intravenous,  Once, 12 of 12 cycles Administration: 900 mg (08/16/2018), 900 mg (09/07/2018), 900 mg (10/19/2018), 900 mg (11/02/2018), 900 mg (11/16/2018), 800 mg (11/30/2018), 900 mg (03/21/2019), 900 mg (04/04/2019), 900 mg (04/18/2019), 900 mg (05/02/2019), 900 mg (05/17/2019), 800 mg (05/31/2019) oxaliplatin (ELOXATIN) 190 mg in dextrose 5 % 500 mL chemo infusion, 85 mg/m2 = 190 mg, Intravenous,  Once, 12 of 12 cycles Dose modification: 68 mg/m2 (80 % of original dose 85 mg/m2, Cycle 2, Reason: Provider Judgment), 68 mg/m2 (80 % of original dose 85 mg/m2, Cycle 3, Reason: Provider Judgment) Administration: 190 mg (08/16/2018), 150 mg (09/07/2018), 150 mg (10/19/2018), 150 mg (11/16/2018), 150 mg (11/02/2018), 135  mg (11/30/2018), 150 mg (03/21/2019), 150 mg (04/04/2019), 150  mg (04/18/2019), 150 mg (05/02/2019), 150 mg (05/17/2019), 130 mg (05/31/2019) fluorouracil (ADRUCIL) chemo injection 700 mg, 320 mg/m2 = 700 mg (100 % of original dose 320 mg/m2), Intravenous,  Once, 9 of 9 cycles Dose modification: 320 mg/m2 (original dose 320 mg/m2, Cycle 4) Administration: 700 mg (11/02/2018), 700 mg (11/16/2018), 650 mg (11/30/2018), 700 mg (03/21/2019), 700 mg (04/04/2019), 700 mg (04/18/2019), 700 mg (05/02/2019), 700 mg (05/17/2019), 700 mg (05/31/2019) fosaprepitant (EMEND) 150 mg, dexamethasone (DECADRON) 12 mg in sodium chloride 0.9 % 145 mL IVPB, , Intravenous,  Once, 12 of 12 cycles Administration:  (08/16/2018),  (09/07/2018),  (10/19/2018),  (11/02/2018),  (11/16/2018),  (11/30/2018),  (03/21/2019),  (04/04/2019),  (04/18/2019),  (05/02/2019),  (05/17/2019),  (05/31/2019) fluorouracil (ADRUCIL) 5,400 mg in sodium chloride 0.9 % 142 mL chemo infusion, 2,400 mg/m2 = 5,400 mg, Intravenous, 1 Day/Dose, 12 of 12 cycles Dose modification: 1,920 mg/m2 (80 % of original dose 2,400 mg/m2, Cycle 3, Reason: Provider Judgment) Administration: 5,400 mg (08/16/2018), 5,400 mg (09/07/2018), 4,300 mg (10/19/2018), 4,300 mg (11/02/2018), 4,300 mg (11/16/2018), 3,850 mg (11/30/2018), 4,300 mg (03/21/2019), 4,300 mg (04/04/2019), 4,300 mg (04/18/2019), 4,300 mg (05/02/2019), 4,300 mg (05/17/2019), 3,850 mg (05/31/2019) bevacizumab-awwb (MVASI) 500 mg in sodium chloride 0.9 % 100 mL chemo infusion, 5 mg/kg = 500 mg (100 % of original dose 5 mg/kg), Intravenous,  Once, 6 of 6 cycles Dose modification: 5 mg/kg (original dose 5 mg/kg, Cycle 1), 5 mg/kg (original dose 5 mg/kg, Cycle 2) Administration: 500 mg (08/16/2018), 500 mg (09/07/2018), 500 mg (10/19/2018), 500 mg (11/02/2018), 450 mg (11/16/2018), 400 mg (11/30/2018)  for chemotherapy treatment.       CANCER STAGING: Cancer Staging Adenocarcinoma of sigmoid colon [facility] Staging form: Colon and Rectum, AJCC 8th Edition - Pathologic stage from 04/01/2016: Stage IIA (pT3, pN0, cM0) - Signed  by Baird Cancer, PA-C on 04/01/2016    INTERVAL HISTORY:  Mr. Betit 64 y.o. male seen for follow-up of metastatic rectal cancer to the liver.  He is currently taking Xeloda 1 tablet twice daily.  Reports diarrhea predominantly in the evenings.  Appetite and energy levels are 75%.  No pain reported.  No pain in the palms and soles or skin peeling off.  He is currently taking Lomotil 2 tablets twice daily.  REVIEW OF SYSTEMS:  Review of Systems  Gastrointestinal: Positive for diarrhea.  All other systems reviewed and are negative.    PAST MEDICAL/SURGICAL HISTORY:  Past Medical History:  Diagnosis Date  . Adenocarcinoma of sigmoid colon ([place]) 03/23/2016  . Carpal tunnel syndrome 05/02/2014   Bilateral  . Cervical disc disorder with radiculopathy of cervical region 05/02/2014   Left C8  . Diabetes mellitus without complication ([place])   . Hypertension   . Port-A-Cath in place 08/10/2018   Past Surgical History:  Procedure Laterality Date  . BIOPSY  03/20/2016   Procedure: BIOPSY;  Surgeon: Rogene Houston, MD;  Location: [facility];  Service: Endoscopy;;  colon  . BOWEL RESECTION N/A 03/23/2016   Procedure: LOW ANTERIOR RESECTION;  Surgeon: Aviva Signs, MD;  Location: [facility] ORS;  Service: General;  Laterality: N/A;  . COLON SURGERY  01/2019   Done at [facility]  . COLONOSCOPY N/A 03/20/2016   Procedure: COLONOSCOPY;  Surgeon: Rogene Houston, MD;  Location: [facility];  Service: Endoscopy;  Laterality: N/A;  730  . COLONOSCOPY N/A 07/10/2016   Procedure: COLONOSCOPY;  Surgeon: Rogene Houston, MD;  Location:  [facility];  Service: Endoscopy;  Laterality: N/A;  1040  . NO PAST SURGERIES    . POLYPECTOMY  07/10/2016   Procedure: POLYPECTOMY;  Surgeon: Rogene Houston, MD;  Location: [facility];  Service: Endoscopy;;  colon  . PORTACATH PLACEMENT Left 08/05/2018   Procedure: INSERTION PORT-A-CATH;  Surgeon: Aviva Signs, MD;  Location: [facility] ORS;  Service: General;  Laterality:  Left;  . TONSILLECTOMY       SOCIAL HISTORY:  Social History   Socioeconomic History  . Marital status: Married    Spouse name: Not on file  . Number of children: Not on file  . Years of education: Not on file  . Highest education level: Not on file  Occupational History  . Not on file  Tobacco Use  . Smoking status: Never Smoker  . Smokeless tobacco: Never Used  Substance and Sexual Activity  . Alcohol use: Yes    Comment: rarely  . Drug use: No  . Sexual activity: Not on file  Other Topics Concern  . Not on file  Social History Narrative  . Not on file   Social Determinants of Health   Financial Resource Strain:   . Difficulty of Paying Living Expenses:   Food Insecurity:   . Worried About Charity fundraiser in the Last Year:   . Arboriculturist in the Last Year:   Transportation Needs:   . Film/video editor (Medical):   Marland Kitchen Lack of Transportation (Non-Medical):   Physical Activity:   . Days of Exercise per Week:   . Minutes of Exercise per Session:   Stress:   . Feeling of Stress :   Social Connections:   . Frequency of Communication with Friends and Family:   . Frequency of Social Gatherings with Friends and Family:   . Attends Religious Services:   . Active Member of Clubs or Organizations:   . Attends Archivist Meetings:   Marland Kitchen Marital Status:   Intimate Partner Violence:   . Fear of Current or Ex-Partner:   . Emotionally Abused:   Marland Kitchen Physically Abused:   . Sexually Abused:     FAMILY HISTORY:  Family History  Problem Relation Age of Onset  . COPD Mother   . Melanoma Father     CURRENT MEDICATIONS:  Outpatient Encounter Medications as of 07/03/2019  Medication Sig  . allopurinol (ZYLOPRIM) 300 MG tablet TAKE ONE TABLET BY MOUTH ONCE DAILY.  Marland Kitchen amLODipine (NORVASC) 10 MG tablet Take 10 mg by mouth daily.  . Bevacizumab (AVASTIN IV) Inject into the vein every 14 (fourteen) days.  . capecitabine (XELODA) 500 MG tablet Take 3 tablets  (1,500 mg total) by mouth 2 (two) times daily after a meal. Take Monday through Friday. Take only on days of radiation. (Patient taking differently: Take 500 mg by mouth 2 (two) times daily after a meal. Take Monday through Friday. Take only on days of radiation.)  . dextrose 5 % SOLN 1,000 mL with fluorouracil 5 GM/100ML SOLN Inject into the vein over 48 hr.  . DIPHENHIST 12.5 MG/5ML liquid SWISH AND SPIT/SWALLOWOONE TEASPOONFUL BY MOUTH 4 TIMES DAILY AS NEEEDE FOR MOUTH PAIN.  Marland Kitchen donepezil (ARICEPT) 10 MG tablet TAKE (1) TABLET BY MOUTH AT BEDTIME.  . febuxostat (ULORIC) 40 MG tablet Take 1 tablet (40 mg total) by mouth daily.  Marland Kitchen FLUOROURACIL IV Inject into the vein every 14 (fourteen) days.  . insulin glargine (LANTUS) 100 UNIT/ML injection Inject 30 Units  into the skin at bedtime.   . IRINOTECAN HCL IV Inject into the vein every 14 (fourteen) days.  Marland Kitchen LEUCOVORIN CALCIUM IV Inject into the vein every 14 (fourteen) days.  . metoprolol succinate (TOPROL-XL) 50 MG 24 hr tablet Take 50 mg by mouth daily. Take with or immediately following a meal.  . MITIGARE 0.6 MG CAPS Take 1 capsule by mouth as needed.   . OXALIPLATIN IV Inject into the vein every 14 (fourteen) days.  Marland Kitchen scopolamine (TRANSDERM-SCOP) 1 MG/3DAYS Place 1 patch (1.5 mg total) onto the skin every 3 (three) days.  . diphenoxylate-atropine (LOMOTIL) 2.5-0.025 MG tablet Take 1 tablet by mouth 4 (four) times daily as needed for diarrhea or loose stools. Take 2 tablets at the first onset of diarrhea followed by 1 tablet after every watery bowel movement. (Patient not taking: Reported on 07/03/2019)  . lidocaine (XYLOCAINE) 2 % solution SMARTSIG:1 Teaspoon By Mouth 4 Times Daily PRN  . lidocaine-prilocaine (EMLA) cream Apply a small amount to port a cath site and cover with plastic wrap 1 hour prior to chemotherapy appointments (Patient not taking: Reported on 07/03/2019)  . loperamide (IMODIUM A-D) 2 MG tablet Take 2 at onset of diarrhea, then 1  after each watery bowel movement. (Patient not taking: Reported on 07/03/2019)  . magic mouthwash w/lidocaine SOLN Take 5 mLs by mouth 4 (four) times daily as needed for mouth pain. (Patient not taking: Reported on 07/03/2019)  . prochlorperazine (COMPAZINE) 10 MG tablet Take 1 tablet (10 mg total) by mouth every 6 (six) hours as needed (Nausea or vomiting). (Patient not taking: Reported on 07/03/2019)  . QC ANTACID/ANTI-GAS 200-200-20 MG/5ML suspension SMARTSIG:1 Teaspoon By Mouth 4 Times Daily PRN  . [DISCONTINUED] HYDROcodone-acetaminophen (NORCO) 5-325 MG tablet Take 1 tablet by mouth every 4 (four) hours as needed for moderate pain. (Patient not taking: Reported on 07/03/2019)   No facility-administered encounter medications on file as of 07/03/2019.    ALLERGIES:  Allergies  Allergen Reactions  . Penicillin G Nausea And Vomiting     PHYSICAL EXAM:  ECOG Performance status: 1  Vital signs: Blood pressure is 125/82.  Pulse rate is 79.  Respirate is 18.  Temperature 98.  Saturations 100.  Physical Exam Cardiovascular:     Rate and Rhythm: Normal rate and regular rhythm.     Heart sounds: Normal heart sounds.  Pulmonary:     Effort: Pulmonary effort is normal.     Breath sounds: Normal breath sounds.  Abdominal:     General: There is no distension.     Palpations: Abdomen is soft. There is no mass.  Musculoskeletal:        General: Normal range of motion.  Skin:    General: Skin is warm and dry.  Neurological:     Mental Status: He is alert and oriented to person, place, and time. Mental status is at baseline.  Psychiatric:        Mood and Affect: Mood normal.        Behavior: Behavior normal.        Thought Content: Thought content normal.        Judgment: Judgment normal.      LABORATORY DATA:  I have reviewed the labs as listed.  CBC    Component Value Date/Time   WBC 4.0 07/03/2019 1104   RBC 3.56 (L) 07/03/2019 1104   HGB 9.9 (L) 07/03/2019 1104   HCT 33.5 (L)  07/03/2019 1104   PLT 144 (L) 07/03/2019 1104  MCV 94.1 07/03/2019 1104   MCH 27.8 07/03/2019 1104   MCHC 29.6 (L) 07/03/2019 1104   RDW 17.5 (H) 07/03/2019 1104   LYMPHSABS 0.9 07/03/2019 1104   MONOABS 0.3 07/03/2019 1104   EOSABS 0.1 07/03/2019 1104   BASOSABS 0.0 07/03/2019 1104   CMP Latest Ref Rng & Units 07/03/2019 06/26/2019 06/26/2019  Glucose 70 - 99 mg/dL 127(H) 113(H) 160(H)  BUN 8 - 23 mg/dL 19 21 19   Creatinine 0.61 - 1.24 mg/dL 1.12 0.93 0.90  Sodium 135 - 145 mmol/L 140 141 140  Potassium 3.5 - 5.1 mmol/L 4.8 3.6 4.2  Chloride 98 - 111 mmol/L 105 107 105  CO2 22 - 32 mmol/L 27 25 25   Calcium 8.9 - 10.3 mg/dL 9.7 9.4 9.5  Total Protein 6.5 - 8.1 g/dL 6.3(L) 6.6 6.2(L)  Total Bilirubin 0.3 - 1.2 mg/dL 1.1 0.7 0.8  Alkaline Phos 38 - 126 U/L 57 69 62  AST 15 - 41 U/L 12(L) 12(L) 13(L)  ALT 0 - 44 U/L 9 9 9      I have reviewed scans.  ASSESSMENT & PLAN:   Adenocarcinoma of sigmoid colon ([place]) 1.  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 -We have cut back on Xeloda to 1 tablet twice daily as he had episode of confusion on day 1. -So far he has tolerated Xeloda very well.  He noticed slight increase in diarrhea mostly in the evenings. -I have told him to increase Xeloda to 2 tablets twice daily.  I will plan to reevaluate him in 1 week.  I have reviewed labs from today which showed grossly normal LFTs and white count.  2.  Diarrhea: -He is having diarrhea predominantly in the evenings.  He is taking Lomotil 2 tablets twice daily. -I have recommended increasing Lomotil to 2 tablets 3 times a day.  3.  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  We have made a referral to neurology.  4.  Gout: -We have discontinued allopurinol.  We have started him on Uloric 40 mg daily.  5.  Hypomagnesemia: Magnesium today is 1.8.  Continue home supplements.     Orders placed this encounter:  No orders of the defined types  were placed in this encounter.    Derek Jack, MD [place] [phone]

## 2019-07-04 ENCOUNTER — Other Ambulatory Visit: Payer: Self-pay

## 2019-07-04 ENCOUNTER — Ambulatory Visit
Admission: RE | Admit: 2019-07-04 | Discharge: 2019-07-04 | Disposition: A | Discharge status: Home / Self Care | Payer: 59 | Source: Ambulatory Visit | Attending: Radiation Oncology | Admitting: Radiation Oncology

## 2019-07-04 DIAGNOSIS — Z510 Encounter for antineoplastic radiation therapy: Secondary | ICD-10-CM | POA: Diagnosis not present

## 2019-07-05 ENCOUNTER — Ambulatory Visit
Admission: RE | Admit: 2019-07-05 | Discharge: 2019-07-05 | Disposition: A | Discharge status: Home / Self Care | Payer: 59 | Source: Ambulatory Visit | Attending: Radiation Oncology | Admitting: Radiation Oncology

## 2019-07-05 ENCOUNTER — Other Ambulatory Visit: Payer: Self-pay

## 2019-07-05 DIAGNOSIS — Z510 Encounter for antineoplastic radiation therapy: Secondary | ICD-10-CM | POA: Diagnosis not present

## 2019-07-06 ENCOUNTER — Other Ambulatory Visit: Payer: Self-pay

## 2019-07-06 ENCOUNTER — Ambulatory Visit
Admission: RE | Admit: 2019-07-06 | Discharge: 2019-07-06 | Disposition: A | Discharge status: Home / Self Care | Payer: 59 | Source: Ambulatory Visit | Attending: Radiation Oncology | Admitting: Radiation Oncology

## 2019-07-06 DIAGNOSIS — Z510 Encounter for antineoplastic radiation therapy: Secondary | ICD-10-CM | POA: Diagnosis not present

## 2019-07-07 ENCOUNTER — Ambulatory Visit
Admission: RE | Admit: 2019-07-07 | Discharge: 2019-07-07 | Disposition: A | Discharge status: Home / Self Care | Payer: 59 | Source: Ambulatory Visit | Attending: Radiation Oncology | Admitting: Radiation Oncology

## 2019-07-07 ENCOUNTER — Other Ambulatory Visit: Payer: Self-pay

## 2019-07-07 DIAGNOSIS — Z510 Encounter for antineoplastic radiation therapy: Secondary | ICD-10-CM | POA: Diagnosis not present

## 2019-07-11 ENCOUNTER — Inpatient Hospital Stay (HOSPITAL_COMMUNITY): Payer: 59 | Attending: Hematology | Admitting: Hematology

## 2019-07-11 ENCOUNTER — Inpatient Hospital Stay (HOSPITAL_COMMUNITY): Payer: 59

## 2019-07-11 ENCOUNTER — Ambulatory Visit
Admission: RE | Admit: 2019-07-11 | Discharge: 2019-07-11 | Disposition: A | Discharge status: Home / Self Care | Payer: 59 | Source: Ambulatory Visit | Attending: Radiation Oncology | Admitting: Radiation Oncology

## 2019-07-11 ENCOUNTER — Encounter (HOSPITAL_COMMUNITY): Payer: Self-pay | Admitting: Hematology

## 2019-07-11 ENCOUNTER — Other Ambulatory Visit: Payer: Self-pay

## 2019-07-11 VITALS — BP 116/70 | HR 72 | Temp 96.9°F | Resp 19 | Wt 180.3 lb

## 2019-07-11 DIAGNOSIS — C187 Malignant neoplasm of sigmoid colon: Secondary | ICD-10-CM

## 2019-07-11 DIAGNOSIS — C787 Secondary malignant neoplasm of liver and intrahepatic bile duct: Secondary | ICD-10-CM | POA: Insufficient documentation

## 2019-07-11 DIAGNOSIS — Z79899 Other long term (current) drug therapy: Secondary | ICD-10-CM | POA: Insufficient documentation

## 2019-07-11 DIAGNOSIS — R413 Other amnesia: Secondary | ICD-10-CM | POA: Diagnosis not present

## 2019-07-11 DIAGNOSIS — R197 Diarrhea, unspecified: Secondary | ICD-10-CM | POA: Diagnosis not present

## 2019-07-11 DIAGNOSIS — Z510 Encounter for antineoplastic radiation therapy: Secondary | ICD-10-CM | POA: Insufficient documentation

## 2019-07-11 DIAGNOSIS — M109 Gout, unspecified: Secondary | ICD-10-CM | POA: Diagnosis not present

## 2019-07-11 DIAGNOSIS — E119 Type 2 diabetes mellitus without complications: Secondary | ICD-10-CM | POA: Diagnosis not present

## 2019-07-11 DIAGNOSIS — Z794 Long term (current) use of insulin: Secondary | ICD-10-CM | POA: Diagnosis not present

## 2019-07-11 DIAGNOSIS — I10 Essential (primary) hypertension: Secondary | ICD-10-CM | POA: Diagnosis not present

## 2019-07-11 LAB — COMPREHENSIVE METABOLIC PANEL
ALT: 9 U/L (ref 0–44)
AST: 11 U/L — ABNORMAL LOW (ref 15–41)
Albumin: 3.5 g/dL (ref 3.5–5.0)
Alkaline Phosphatase: 58 U/L (ref 38–126)
Anion gap: 9 (ref 5–15)
BUN: 14 mg/dL (ref 8–23)
CO2: 27 mmol/L (ref 22–32)
Calcium: 9.6 mg/dL (ref 8.9–10.3)
Chloride: 107 mmol/L (ref 98–111)
Creatinine, Ser: 1.13 mg/dL (ref 0.61–1.24)
GFR calc Af Amer: >60 mL/min (ref >60)
GFR calc non Af Amer: >60 mL/min (ref >60)
Glucose, Bld: 143 mg/dL — ABNORMAL HIGH (ref 70–99)
Potassium: 3.5 mmol/L (ref 3.5–5.1)
Sodium: 143 mmol/L (ref 135–145)
Total Bilirubin: 0.6 mg/dL (ref 0.3–1.2)
Total Protein: 6.1 g/dL — ABNORMAL LOW (ref 6.5–8.1)

## 2019-07-11 LAB — CBC WITH DIFFERENTIAL/PLATELET
Abs Immature Granulocytes: 0.01 10*3/uL (ref 0.00–0.07)
Basophils Absolute: 0 10*3/uL (ref 0.0–0.1)
Basophils Relative: 0 %
Eosinophils Absolute: 0.1 10*3/uL (ref 0.0–0.5)
Eosinophils Relative: 4 %
HCT: 30 % — ABNORMAL LOW (ref 39.0–52.0)
Hemoglobin: 9.6 g/dL — ABNORMAL LOW (ref 13.0–17.0)
Immature Granulocytes: 0 %
Lymphocytes Relative: 20 %
Lymphs Abs: 0.5 10*3/uL — ABNORMAL LOW (ref 0.7–4.0)
MCH: 30 pg (ref 26.0–34.0)
MCHC: 32 g/dL (ref 30.0–36.0)
MCV: 93.8 fL (ref 80.0–100.0)
Monocytes Absolute: 0.2 10*3/uL (ref 0.1–1.0)
Monocytes Relative: 7 %
Neutro Abs: 1.9 10*3/uL (ref 1.7–7.7)
Neutrophils Relative %: 69 %
Platelets: 110 10*3/uL — ABNORMAL LOW (ref 150–400)
RBC: 3.2 MIL/uL — ABNORMAL LOW (ref 4.22–5.81)
RDW: 16.7 % — ABNORMAL HIGH (ref 11.5–15.5)
WBC: 2.7 10*3/uL — ABNORMAL LOW (ref 4.0–10.5)
nRBC: 0 % (ref 0.0–0.2)

## 2019-07-11 LAB — MAGNESIUM: Magnesium: 1.5 mg/dL — ABNORMAL LOW (ref 1.7–2.4)

## 2019-07-11 MED ORDER — MAGNESIUM OXIDE 400 (241.3 MG) MG PO TABS: 400.0000 mg | ORAL_TABLET | Freq: Two times a day (BID) | ORAL | 2 refills | Status: DC

## 2019-07-11 NOTE — Progress Notes (Signed)
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  NGS Results: Foundation 1 shows MS-stable, RAS wild-type.  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 Focal narrowing and wall thickening of the sigmoid colon is noted in this area most likely due to inflammation, but neoplasm cannot be excluded, and sigmoidoscopy is recommended for further evaluation.   03/20/2016 Procedure   Colonoscopy by Dr. Laural Golden- Malignant partially obstructing tumor in the distal sigmoid colon. Biopsied. - Incomplete exam.   03/23/2016 Pathology Results   Colon, biopsy, sigmoid mass - ADENOCARCINOMA.   03/23/2016 Procedure   Low anterior resection with transanal anastomosis   03/26/2016 Pathology Results   Colon, segmental resection for tumor, sigmoid - INVASIVE WELL DIFFERENTIATED ADENOCARCINOMA, SPANNING 5 CM IN GREATEST DIMENSION. - TUMOR INVADES THROUGH MUSCULARIS PROPRIA TO INVOLVE SUBSEROSAL SOFT TISSUES. - UNDERLYING MARKED ACUTE AND CHRONIC INFLAMMATION WITH GIANT CELL REACTION AND ABSCESS FORMATION EXTENDING TO THE SEROSAL SURFACE AND INVOLVING THE MESENTERIC TISSUE. - MARGINS ARE NEGATIVE FOR TUMOR. - FOURTEEN BENIGN LYMPH NODES WHICH DEMONSTRATE VARIABLE INVOLVEMENT WITH ACUTE AND CHRONIC INFLAMMATION WITH ASSOCIATED GIANT CELLS WITHOUT TUMOR IDENTIFIED (0/14).   04/08/2016 Cancer Staging   Cancer Staging Adenocarcinoma of sigmoid colon [facility]) Staging form: Colon and Rectum, AJCC 8th Edition - Pathologic stage from 04/01/2016: Stage IIA (pT3, pN0, cM0) - Signed by  Baird Cancer, PA-C on 04/01/2016    07/09/2016 Imaging   CT abd/pelvis- No evidence of metastatic disease or other acute findings.  Bilateral nonobstructing renal calculi and mild bilateral renal parenchymal scarring.  Stable mildly enlarged prostate gland and findings of chronic bladder outlet obstruction.   08/16/2018 -  Chemotherapy   The patient had palonosetron (ALOXI) injection 0.25 mg, 0.25 mg, Intravenous,  Once, 12 of 12 cycles Administration: 0.25 mg (08/16/2018), 0.25 mg (09/07/2018), 0.25 mg (10/19/2018), 0.25 mg (11/02/2018), 0.25 mg (11/16/2018), 0.25 mg (11/30/2018), 0.25 mg (03/21/2019), 0.25 mg (04/04/2019), 0.25 mg (04/18/2019), 0.25 mg (05/02/2019), 0.25 mg (05/17/2019), 0.25 mg (05/31/2019) pegfilgrastim (NEULASTA) injection 6 mg, 6 mg, Subcutaneous, Once, 12 of 12 cycles Administration: 6 mg (08/18/2018), 6 mg (09/09/2018), 6 mg (10/21/2018), 6 mg (11/04/2018), 6 mg (11/18/2018), 6 mg (12/02/2018), 6 mg (03/23/2019), 6 mg (04/06/2019), 6 mg (04/20/2019), 6 mg (05/04/2019), 6 mg (05/19/2019), 6 mg (06/02/2019) irinotecan (CAMPTOSAR) 380 mg in sodium chloride 0.9 % 500 mL chemo infusion, 165 mg/m2 = 380 mg, Intravenous,  Once, 1 of 1 cycle Administration: 380 mg (08/16/2018) leucovorin 900 mg in dextrose 5 % 250 mL infusion, 400 mg/m2 = 900 mg, Intravenous,  Once, 12 of 12 cycles Administration: 900 mg (08/16/2018), 900 mg (09/07/2018), 900 mg (10/19/2018), 900 mg (11/02/2018), 900 mg (11/16/2018), 800 mg (11/30/2018), 900 mg (03/21/2019), 900 mg (04/04/2019), 900 mg (04/18/2019), 900 mg (05/02/2019), 900 mg (05/17/2019), 800 mg (05/31/2019) oxaliplatin (ELOXATIN) 190 mg in dextrose 5 % 500 mL chemo infusion, 85 mg/m2 = 190 mg, Intravenous,  Once, 12 of 12 cycles Dose modification: 68 mg/m2 (80 % of original dose 85 mg/m2, Cycle 2, Reason: Provider Judgment), 68 mg/m2 (80 % of original dose 85 mg/m2, Cycle 3, Reason: Provider Judgment)  Administration: 190 mg (08/16/2018), 150 mg (09/07/2018), 150 mg (10/19/2018), 150 mg  (11/16/2018), 150 mg (11/02/2018), 135 mg (11/30/2018), 150 mg (03/21/2019), 150 mg (04/04/2019), 150 mg (04/18/2019), 150 mg (05/02/2019), 150 mg (05/17/2019), 130 mg (05/31/2019) fluorouracil (ADRUCIL) chemo injection 700 mg, 320 mg/m2 = 700 mg (100 % of original dose 320 mg/m2), Intravenous,  Once, 9 of 9 cycles Dose modification: 320 mg/m2 (original dose 320 mg/m2, Cycle 4) Administration: 700 mg (11/02/2018), 700 mg (11/16/2018), 650 mg (11/30/2018), 700 mg (03/21/2019), 700 mg (04/04/2019), 700 mg (04/18/2019), 700 mg (05/02/2019), 700 mg (05/17/2019), 700 mg (05/31/2019) fosaprepitant (EMEND) 150 mg, dexamethasone (DECADRON) 12 mg in sodium chloride 0.9 % 145 mL IVPB, , Intravenous,  Once, 12 of 12 cycles Administration:  (08/16/2018),  (09/07/2018),  (10/19/2018),  (11/02/2018),  (11/16/2018),  (11/30/2018),  (03/21/2019),  (04/04/2019),  (04/18/2019),  (05/02/2019),  (05/17/2019),  (05/31/2019) fluorouracil (ADRUCIL) 5,400 mg in sodium chloride 0.9 % 142 mL chemo infusion, 2,400 mg/m2 = 5,400 mg, Intravenous, 1 Day/Dose, 12 of 12 cycles Dose modification: 1,920 mg/m2 (80 % of original dose 2,400 mg/m2, Cycle 3, Reason: Provider Judgment) Administration: 5,400 mg (08/16/2018), 5,400 mg (09/07/2018), 4,300 mg (10/19/2018), 4,300 mg (11/02/2018), 4,300 mg (11/16/2018), 3,850 mg (11/30/2018), 4,300 mg (03/21/2019), 4,300 mg (04/04/2019), 4,300 mg (04/18/2019), 4,300 mg (05/02/2019), 4,300 mg (05/17/2019), 3,850 mg (05/31/2019) bevacizumab-awwb (MVASI) 500 mg in sodium chloride 0.9 % 100 mL chemo infusion, 5 mg/kg = 500 mg (100 % of original dose 5 mg/kg), Intravenous,  Once, 6 of 6 cycles Dose modification: 5 mg/kg (original dose 5 mg/kg, Cycle 1), 5 mg/kg (original dose 5 mg/kg, Cycle 2) Administration: 500 mg (08/16/2018), 500 mg (09/07/2018), 500 mg (10/19/2018), 500 mg (11/02/2018), 450 mg (11/16/2018), 400 mg (11/30/2018)  for chemotherapy treatment.      CANCER STAGING: Cancer Staging Adenocarcinoma of sigmoid colon [facility]) Staging form: Colon and  Rectum, AJCC 8th Edition - Pathologic stage from 04/01/2016: Stage IIA (pT3, pN0, cM0) - Signed by Baird Cancer, PA-C on 04/01/2016   INTERVAL HISTORY:  Troy Smith, a 64 y.o. 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 Troy Smith was last seen on 07/03/2019.   He continues to take his lamotil as prescribed.He is tolerating Xeloda well. His appetite is mildly decreased, but he states that he is eating well.  He gained some weight from last visit.  REVIEW OF SYSTEMS:  Review of Systems  Constitutional: Positive for appetite change and fatigue.    PAST MEDICAL/SURGICAL HISTORY:  Past Medical History:  Diagnosis Date  . 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 Carpal tunnel syndrome 05/02/2014   Bilateral  . Cervical disc disorder with radiculopathy of cervical region 05/02/2014   Left C8  . Diabetes mellitus without complication ([place])   . Hypertension   . Port-A-Cath in place 08/10/2018   Past Surgical History:  Procedure Laterality Date  . BIOPSY  03/20/2016   Procedure: BIOPSY;  Surgeon: Rogene Houston, MD;  Location[facility];  Service: Endoscopy;;  colon  . BOWEL RESECTION N/A 03/23/2016   Procedure: LOW ANTERIOR RESECTION;  Surgeon: Aviva Signs, MD;  Location: [facility] ORS;  Service: General;  Laterality: N/A;  . COLON SURGERY  01/2019   Done at [facility]  . COLONOSCOPY N/A 03/20/2016   Procedure: COLONOSCOPY;  Surgeon: Rogene Houston, MD;  Location: [facility];  Service: Endoscopy;  Laterality: N/A;  730  . COLONOSCOPY N/A 07/10/2016   Procedure: COLONOSCOPY;  Surgeon: Rogene Houston, MD;  Location: [facility];  Service: Endoscopy;  Laterality: N/A;  1040  . NO PAST SURGERIES    . POLYPECTOMY  07/10/2016   Procedure: POLYPECTOMY;  Surgeon: Rogene Houston, MD;  Location: [facility];  Service: Endoscopy;;  colon  . PORTACATH PLACEMENT Left 08/05/2018   Procedure: INSERTION PORT-A-CATH;  Surgeon: Aviva Signs,  MD;  Location: [facility];  Service: General;  Laterality: Left;  . TONSILLECTOMY      SOCIAL HISTORY:  Social History   Socioeconomic History  . Marital status: Married    Spouse name: Not on file  . Number of children: Not on file  . Years of education: Not on file  . Highest education level: Not on file  Occupational History  . Not on file  Tobacco Use  . Smoking status: Never Smoker  . Smokeless tobacco: Never Used  Substance and Sexual Activity  . Alcohol use: Yes    Comment: rarely  . Drug use: No  . Sexual activity: Not on file  Other Topics Concern  . Not on file  Social History Narrative  . Not on file   Social Determinants of Health   Financial Resource Strain:   . Difficulty of Paying Living Expenses:   Food Insecurity:   . Worried About Charity fundraiser in the Last Year:   . Arboriculturist in the Last Year:   Transportation Needs:   . Film/video editor (Medical):   Marland Kitchen Lack of Transportation (Non-Medical):   Physical Activity:   . Days of Exercise per Week:   . Minutes of Exercise per Session:   Stress:   . Feeling of Stress :   Social Connections:   . Frequency of Communication with Friends and Family:   . Frequency of Social Gatherings with Friends and Family:   . Attends Religious Services:   . Active Member of Clubs or Organizations:   . Attends Archivist Meetings:   Marland Kitchen Marital Status:   Intimate Partner Violence:   . Fear of Current or Ex-Partner:   . Emotionally Abused:   Marland Kitchen Physically Abused:   . Sexually Abused:     FAMILY HISTORY:  Family History  Problem Relation Age of Onset  . COPD Mother   . Melanoma Father     CURRENT MEDICATIONS:  Current Outpatient Medications  Medication Sig Dispense Refill  . allopurinol (ZYLOPRIM) 300 MG tablet TAKE ONE TABLET BY MOUTH ONCE DAILY. 30 tablet 0  . amLODipine (NORVASC) 10 MG tablet Take 10 mg by mouth daily.    . Bevacizumab (AVASTIN IV) Inject into the vein every 14  (fourteen) days.    . capecitabine (XELODA) 500 MG tablet Take 3 tablets (1,500 mg total) by mouth 2 (two) times daily after a meal. Take Monday through Friday. Take only on days of radiation. (Patient taking differently: Take 500 mg by mouth 2 (two) times daily after a meal. Take Monday through Friday. Take only on days of radiation.) 150 tablet 0  . dextrose 5 % SOLN 1,000 mL with fluorouracil 5 GM/100ML SOLN Inject into the vein over 48 hr.    . diphenoxylate-atropine (LOMOTIL) 2.5-0.025 MG tablet Take 1 tablet by mouth 4 (four) times daily as needed for diarrhea or loose stools. Take 2 tablets at the first onset of diarrhea followed by 1 tablet after every watery bowel movement. 60 tablet 2  . donepezil (ARICEPT) 10 MG tablet TAKE (1) TABLET BY MOUTH  AT BEDTIME. 30 tablet 0  . febuxostat (ULORIC) 40 MG tablet Take 1 tablet (40 mg total) by mouth daily. 30 tablet 2  . FLUOROURACIL IV Inject into the vein every 14 (fourteen) days.    . insulin glargine (LANTUS) 100 UNIT/ML injection Inject 30 Units into the skin at bedtime.     . IRINOTECAN HCL IV Inject into the vein every 14 (fourteen) days.    Marland Kitchen LEUCOVORIN CALCIUM IV Inject into the vein every 14 (fourteen) days.    Marland Kitchen lidocaine-prilocaine (EMLA) cream Apply a small amount to port a cath site and cover with plastic wrap 1 hour prior to chemotherapy appointments 30 g 3  . loperamide (IMODIUM A-D) 2 MG tablet Take 2 at onset of diarrhea, then 1 after each watery bowel movement. 100 tablet 1  . metoprolol succinate (TOPROL-XL) 50 MG 24 hr tablet Take 50 mg by mouth daily. Take with or immediately following a meal.    . MITIGARE 0.6 MG CAPS Take 1 capsule by mouth as needed.     . OXALIPLATIN IV Inject into the vein every 14 (fourteen) days.    . prochlorperazine (COMPAZINE) 10 MG tablet Take 1 tablet (10 mg total) by mouth every 6 (six) hours as needed (Nausea or vomiting). 60 tablet 1  . scopolamine (TRANSDERM-SCOP) 1 MG/3DAYS Place 1 patch (1.5 mg  total) onto the skin every 3 (three) days. 10 patch 12   No current facility-administered medications for this visit.    ALLERGIES:  Allergies  Allergen Reactions  . Penicillin G Nausea And Vomiting    PHYSICAL EXAM:  Performance status (ECOG): 1 - Symptomatic but completely ambulatory  Vitals:   07/11/19 1122  BP: 116/70  Pulse: 72  Resp: 19  Temp: (!) 96.9 F (36.1 C)  SpO2: 100%   Wt Readings from Last 3 Encounters:  07/11/19 180 lb 4.8 oz (81.8 kg)  07/03/19 175 lb 3.2 oz (79.5 kg)  06/26/19 177 lb 4.8 oz (80.4 kg)   Physical Exam Vitals reviewed.  Constitutional:      Appearance: Normal appearance.  Cardiovascular:     Rate and Rhythm: Normal rate and regular rhythm.     Heart sounds: Normal heart sounds.  Pulmonary:     Effort: Pulmonary effort is normal.     Breath sounds: Normal breath sounds.  Neurological:     General: No focal deficit present.     Mental Status: He is alert and oriented to person, place, and time.  Psychiatric:        Mood and Affect: Mood normal.        Behavior: Behavior normal.      LABORATORY DATA:  I have reviewed the labs as listed.  CBC Latest Ref Rng & Units 07/11/2019 07/03/2019 06/26/2019  WBC 4.0 - 10.5 K/uL 2.7(L) 4.0 6.7  Hemoglobin 13.0 - 17.0 g/dL 9.6(L) 9.9(L) 9.6(L)  Hematocrit 39.0 - 52.0 % 30.0(L) 33.5(L) 30.9(L)  Platelets 150 - 400 K/uL 110(L) 144(L) 149(L)   CMP Latest Ref Rng & Units 07/11/2019 07/03/2019 06/26/2019  Glucose 70 - 99 mg/dL 143(H) 127(H) 113(H)  BUN 8 - 23 mg/dL 14 19 21   Creatinine 0.61 - 1.24 mg/dL 1.13 1.12 0.93  Sodium 135 - 145 mmol/L 143 140 141  Potassium 3.5 - 5.1 mmol/L 3.5 4.8 3.6  Chloride 98 - 111 mmol/L 107 105 107  CO2 22 - 32 mmol/L 27 27 25   Calcium 8.9 - 10.3 mg/dL 9.6 9.7 9.4  Total Protein 6.5 -  8.1 g/dL 6.1(L) 6.3(L) 6.6  Total Bilirubin 0.3 - 1.2 mg/dL 0.6 1.1 0.7  Alkaline Phos 38 - 126 U/L 58 57 69  AST 15 - 41 U/L 11(L) 12(L) 12(L)  ALT 0 - 44 U/L 9 9 9      DIAGNOSTIC IMAGING:  I have independently reviewed the scans and discussed with the patient. CT Head Wo Contrast  Result Date: 06/26/2019 CLINICAL DATA:  Encephalopathy. Confusion. History of colon cancer with Mets to liver. EXAM: CT HEAD WITHOUT CONTRAST TECHNIQUE: Contiguous axial images were obtained from the base of the skull through the vertex without intravenous contrast. COMPARISON:  Brain MRI 09/16/2018 FINDINGS: Brain: No intracranial hemorrhage, mass effect, or midline shift. No evidence of edema to suggest underlying lesion. No hydrocephalus. The basilar cisterns are patent. No evidence of territorial infarct or acute ischemia. No extra-axial or intracranial fluid collection. Vascular: No hyperdense vessel or unexpected calcification. Skull: No fracture or focal lesion. Sinuses/Orbits: Paranasal sinuses and mastoid air cells are clear. The visualized orbits are unremarkable. Other: None. IMPRESSION: Negative noncontrast head CT. Electronically Signed   By: Keith Rake M.D.   On: 06/26/2019 19:41   CT Chest W Contrast  Result Date: 06/16/2019 CLINICAL DATA:  Metastatic sigmoid colon cancer to the liver post bowel resection and chemotherapy. EXAM: CT CHEST, ABDOMEN, AND PELVIS WITH CONTRAST TECHNIQUE: Multidetector CT imaging of the chest, abdomen and pelvis was performed following the standard protocol during bolus administration of intravenous contrast. CONTRAST:  121mL OMNIPAQUE IOHEXOL 300 MG/ML  SOLN COMPARISON:  Prior CTs 12/07/2018 FINDINGS: CT CHEST FINDINGS Cardiovascular: No acute vascular findings. Mild atherosclerosis of the aorta, great vessels and coronary arteries. Left subclavian Port-A-Cath extends to the level of the mid SVC. There are probable calcifications of the aortic valve. The heart size is normal. There is no pericardial effusion. Mediastinum/Nodes: There are no enlarged mediastinal, hilar or axillary lymph nodes. The thyroid gland, trachea and esophagus  demonstrate no significant findings. Lungs/Pleura: There is no pleural effusion. Multiple small pulmonary nodules are stable, consistent with benign findings. Largest is subpleural in the right lower lobe measuring 6 mm on image 307/3. No new or enlarging nodules. Mild chronic central airway thickening. Musculoskeletal/Chest wall: No chest wall mass or suspicious osseous findings. Stable chronic deformity of the right 10th rib laterally. CT ABDOMEN AND PELVIS FINDINGS Hepatobiliary: Interval cholecystectomy. No biliary dilatation. There is ill-defined low-density adjacent to the cholecystectomy bed on images 51 and 52 series 2. The previously demonstrated low-density lesion in this area which was hypermetabolic on prior PET-CT is significantly smaller or resolved. There is a stable small cyst in the left lobe on image 51/2. No new or enlarging hepatic lesions. Pancreas: Unremarkable. No pancreatic ductal dilatation or surrounding inflammatory changes. Spleen: Stable mild splenomegaly without focal abnormality. Adrenals/Urinary Tract: Both adrenal glands appear normal. Small nonobstructing bilateral renal calculi. There is no hydronephrosis or ureteral calculus, although several chronic bladder calculi are again noted. There is mild diffuse bladder wall thickening. Stomach/Bowel: No evidence of bowel wall thickening, distention or surrounding inflammatory change. Stable postsurgical changes from previous appendectomy and sigmoidectomy. Vascular/Lymphatic: There are no enlarged abdominal or pelvic lymph nodes. Mild aortic and branch vessel atherosclerosis and tortuosity. No acute vascular findings. The portal, superior mesenteric and splenic veins are patent. Reproductive: Moderate enlargement of the prostate gland with median lobe hypertrophy protruding into the bladder base, similar to previous study. Other: Stable postsurgical changes in the anterior abdominal wall without hernia. No ascites or peritoneal  nodularity. Musculoskeletal:  No acute or significant osseous findings. Mild lumbar spondylosis. IMPRESSION: 1. Interval cholecystectomy. There is ill-defined low-density adjacent to the cholecystectomy bed which could be postoperative or relate to the previously demonstrated hypermetabolic nodule in the adjacent left hepatic lobe which is now significantly smaller. 2. No evidence of progressive metastatic disease within the chest, abdomen or pelvis. 3. Stable small pulmonary nodules, consistent with benign findings. 4. Nonobstructing bilateral renal calculi and multiple chronic bladder calculi. 5. Stable prostatomegaly with median lobe hypertrophy protruding into the bladder base. 6. Aortic Atherosclerosis (ICD10-I70.0). Electronically Signed   By: Richardean Sale M.D.   On: 06/16/2019 11:07   CT Abdomen Pelvis W Contrast  Result Date: 06/16/2019 CLINICAL DATA:  Metastatic sigmoid colon cancer to the liver post bowel resection and chemotherapy. EXAM: CT CHEST, ABDOMEN, AND PELVIS WITH CONTRAST TECHNIQUE: Multidetector CT imaging of the chest, abdomen and pelvis was performed following the standard protocol during bolus administration of intravenous contrast. CONTRAST:  110mL OMNIPAQUE IOHEXOL 300 MG/ML  SOLN COMPARISON:  Prior CTs 12/07/2018 FINDINGS: CT CHEST FINDINGS Cardiovascular: No acute vascular findings. Mild atherosclerosis of the aorta, great vessels and coronary arteries. Left subclavian Port-A-Cath extends to the level of the mid SVC. There are probable calcifications of the aortic valve. The heart size is normal. There is no pericardial effusion. Mediastinum/Nodes: There are no enlarged mediastinal, hilar or axillary lymph nodes. The thyroid gland, trachea and esophagus demonstrate no significant findings. Lungs/Pleura: There is no pleural effusion. Multiple small pulmonary nodules are stable, consistent with benign findings. Largest is subpleural in the right lower lobe measuring 6 mm on image  307/3. No new or enlarging nodules. Mild chronic central airway thickening. Musculoskeletal/Chest wall: No chest wall mass or suspicious osseous findings. Stable chronic deformity of the right 10th rib laterally. CT ABDOMEN AND PELVIS FINDINGS Hepatobiliary: Interval cholecystectomy. No biliary dilatation. There is ill-defined low-density adjacent to the cholecystectomy bed on images 51 and 52 series 2. The previously demonstrated low-density lesion in this area which was hypermetabolic on prior PET-CT is significantly smaller or resolved. There is a stable small cyst in the left lobe on image 51/2. No new or enlarging hepatic lesions. Pancreas: Unremarkable. No pancreatic ductal dilatation or surrounding inflammatory changes. Spleen: Stable mild splenomegaly without focal abnormality. Adrenals/Urinary Tract: Both adrenal glands appear normal. Small nonobstructing bilateral renal calculi. There is no hydronephrosis or ureteral calculus, although several chronic bladder calculi are again noted. There is mild diffuse bladder wall thickening. Stomach/Bowel: No evidence of bowel wall thickening, distention or surrounding inflammatory change. Stable postsurgical changes from previous appendectomy and sigmoidectomy. Vascular/Lymphatic: There are no enlarged abdominal or pelvic lymph nodes. Mild aortic and branch vessel atherosclerosis and tortuosity. No acute vascular findings. The portal, superior mesenteric and splenic veins are patent. Reproductive: Moderate enlargement of the prostate gland with median lobe hypertrophy protruding into the bladder base, similar to previous study. Other: Stable postsurgical changes in the anterior abdominal wall without hernia. No ascites or peritoneal nodularity. Musculoskeletal: No acute or significant osseous findings. Mild lumbar spondylosis. IMPRESSION: 1. Interval cholecystectomy. There is ill-defined low-density adjacent to the cholecystectomy bed which could be postoperative or  relate to the previously demonstrated hypermetabolic nodule in the adjacent left hepatic lobe which is now significantly smaller. 2. No evidence of progressive metastatic disease within the chest, abdomen or pelvis. 3. Stable small pulmonary nodules, consistent with benign findings. 4. Nonobstructing bilateral renal calculi and multiple chronic bladder calculi. 5. Stable prostatomegaly with median lobe hypertrophy protruding into the bladder base.  6. Aortic Atherosclerosis (ICD10-I70.0). Electronically Signed   By: Richardean Sale M.D.   On: 06/16/2019 11:07     ASSESSMENT:  1.  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 -Laparoscopic partial hepatectomy, redo low anterior resection on 02/01/2019, pathology showing YPT3Y PN 0.  Positive circumferential/radial/mesenteric margin. -6 more cycles of FOLFOX completed on 05/31/2019. -Xeloda with radiation started on 06/26/2019.   PLAN:  1.  Metastatic sigmoid colon cancer to the liver: -He is taking Xeloda 2 tablets twice daily Monday through Friday.  No worsening of diarrhea. -I reviewed his CBC today.  White count is 2.7 with ANC of 1.9.  Platelets are 110.  Hemoglobin dropped to 9.6. -LFTs are grossly within normal limits. -We will closely monitor his labs.  I will not increase his Xeloda dose.  I will reevaluate him in 1 week.  2.  Diarrhea: -He is taking Lomotil 2 tablets 3 times a day. -He has mild rectal pain with stools likely from radiation. -He will increase Lomotil 2 tablets 4 times a day if needed.  3.  Hypomagnesemia: -Magnesium today is 1.5. -We will start him on magnesium twice daily.  4.  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  Neurology referral was made.  4.  Gout: -Continue Uloric 40 mg daily.   Orders placed this encounter:  No orders of  the defined types were placed in this encounter.    Derek Jack, MD [facility] [phone]   I, Jacqualyn Posey, am acting as a scribe for Dr. Sanda Linger.  I, Derek Jack MD, have reviewed the above documentation for accuracy and completeness, and I agree with the above.

## 2019-07-11 NOTE — Patient Instructions (Signed)
[place] at [facility] Discharge Instructions  You were seen today by Dr. Delton Coombes. He went over your recent results. He prescribed you magnesium twice per day. If he develops diarrhea from the magnesium, you can take two lamotil twice per day. He will see you back in 1 week for labs and follow up.   Thank you for choosing [place] at [facility] to provide your oncology and hematology care.  To afford each patient quality time with our provider, please arrive at least 15 minutes before your scheduled appointment time.   If you have a lab appointment with the [place] please come in thru the  Main Entrance and check in at the main information desk  You need to re-schedule your appointment should you arrive 10 or more minutes late.  We strive to give you quality time with our providers, and arriving late affects you and other patients whose appointments are after yours.  Also, if you no show three or more times for appointments you may be dismissed from the clinic at the providers discretion.     Again, thank you for choosing [facility].  Our hope is that these requests will decrease the amount of time that you wait before being seen by our physicians.       _____________________________________________________________  Should you have questions after your visit to [facility], please contact our office at [phone] between the hours of 8:00 a.m. and 4:30 p.m.  Voicemails left after 4:00 p.m. will not be returned until the following business day.  For prescription refill requests, have your pharmacy contact our office and allow 72 hours.    Cancer Center Support Programs:   > Cancer Support Group  2nd Tuesday of the month 1pm-2pm, Journey Room

## 2019-07-12 ENCOUNTER — Other Ambulatory Visit: Payer: Self-pay

## 2019-07-12 ENCOUNTER — Ambulatory Visit
Admission: RE | Admit: 2019-07-12 | Discharge: 2019-07-12 | Disposition: A | Discharge status: Home / Self Care | Payer: 59 | Source: Ambulatory Visit | Attending: Radiation Oncology | Admitting: Radiation Oncology

## 2019-07-12 DIAGNOSIS — Z510 Encounter for antineoplastic radiation therapy: Secondary | ICD-10-CM | POA: Diagnosis not present

## 2019-07-12 LAB — CEA: CEA: 1.5 ng/mL (ref 0.0–4.7)

## 2019-07-13 ENCOUNTER — Other Ambulatory Visit: Payer: Self-pay

## 2019-07-13 ENCOUNTER — Ambulatory Visit
Admission: RE | Admit: 2019-07-13 | Discharge: 2019-07-13 | Disposition: A | Discharge status: Home / Self Care | Payer: 59 | Source: Ambulatory Visit | Attending: Radiation Oncology | Admitting: Radiation Oncology

## 2019-07-13 DIAGNOSIS — Z510 Encounter for antineoplastic radiation therapy: Secondary | ICD-10-CM | POA: Diagnosis not present

## 2019-07-14 ENCOUNTER — Ambulatory Visit
Admission: RE | Admit: 2019-07-14 | Discharge: 2019-07-14 | Disposition: A | Discharge status: Home / Self Care | Payer: 59 | Source: Ambulatory Visit | Attending: Radiation Oncology | Admitting: Radiation Oncology

## 2019-07-14 ENCOUNTER — Other Ambulatory Visit: Payer: Self-pay

## 2019-07-14 DIAGNOSIS — Z510 Encounter for antineoplastic radiation therapy: Secondary | ICD-10-CM | POA: Diagnosis not present

## 2019-07-17 ENCOUNTER — Other Ambulatory Visit: Payer: Self-pay

## 2019-07-17 ENCOUNTER — Ambulatory Visit
Admission: RE | Admit: 2019-07-17 | Discharge: 2019-07-17 | Disposition: A | Discharge status: Home / Self Care | Payer: 59 | Source: Ambulatory Visit | Attending: Radiation Oncology | Admitting: Radiation Oncology

## 2019-07-17 ENCOUNTER — Other Ambulatory Visit (HOSPITAL_COMMUNITY): Payer: Self-pay | Admitting: Hematology

## 2019-07-17 DIAGNOSIS — C187 Malignant neoplasm of sigmoid colon: Secondary | ICD-10-CM

## 2019-07-17 DIAGNOSIS — Z510 Encounter for antineoplastic radiation therapy: Secondary | ICD-10-CM | POA: Diagnosis not present

## 2019-07-18 ENCOUNTER — Other Ambulatory Visit: Payer: Self-pay

## 2019-07-18 ENCOUNTER — Inpatient Hospital Stay (HOSPITAL_COMMUNITY): Payer: 59

## 2019-07-18 ENCOUNTER — Inpatient Hospital Stay (HOSPITAL_BASED_OUTPATIENT_CLINIC_OR_DEPARTMENT_OTHER): Payer: 59 | Admitting: Hematology

## 2019-07-18 ENCOUNTER — Ambulatory Visit
Admission: RE | Admit: 2019-07-18 | Discharge: 2019-07-18 | Disposition: A | Discharge status: Home / Self Care | Payer: 59 | Source: Ambulatory Visit | Attending: Radiation Oncology | Admitting: Radiation Oncology

## 2019-07-18 VITALS — BP 123/77 | HR 62 | Temp 96.6°F | Resp 17 | Wt 178.8 lb

## 2019-07-18 DIAGNOSIS — Z510 Encounter for antineoplastic radiation therapy: Secondary | ICD-10-CM | POA: Diagnosis not present

## 2019-07-18 DIAGNOSIS — C187 Malignant neoplasm of sigmoid colon: Secondary | ICD-10-CM

## 2019-07-18 LAB — COMPREHENSIVE METABOLIC PANEL
ALT: 10 U/L (ref 0–44)
AST: 12 U/L — ABNORMAL LOW (ref 15–41)
Albumin: 3.9 g/dL (ref 3.5–5.0)
Alkaline Phosphatase: 60 U/L (ref 38–126)
Anion gap: 10 (ref 5–15)
BUN: 21 mg/dL (ref 8–23)
CO2: 26 mmol/L (ref 22–32)
Calcium: 9.6 mg/dL (ref 8.9–10.3)
Chloride: 105 mmol/L (ref 98–111)
Creatinine, Ser: 1.41 mg/dL — ABNORMAL HIGH (ref 0.61–1.24)
GFR calc Af Amer: >60 mL/min (ref >60)
GFR calc non Af Amer: 53 mL/min — ABNORMAL LOW (ref >60)
Glucose, Bld: 113 mg/dL — ABNORMAL HIGH (ref 70–99)
Potassium: 4.5 mmol/L (ref 3.5–5.1)
Sodium: 141 mmol/L (ref 135–145)
Total Bilirubin: 0.7 mg/dL (ref 0.3–1.2)
Total Protein: 6.4 g/dL — ABNORMAL LOW (ref 6.5–8.1)

## 2019-07-18 LAB — CBC WITH DIFFERENTIAL/PLATELET
Abs Immature Granulocytes: 0.02 10*3/uL (ref 0.00–0.07)
Basophils Absolute: 0 10*3/uL (ref 0.0–0.1)
Basophils Relative: 0 %
Eosinophils Absolute: 0.2 10*3/uL (ref 0.0–0.5)
Eosinophils Relative: 4 %
HCT: 31.3 % — ABNORMAL LOW (ref 39.0–52.0)
Hemoglobin: 10.1 g/dL — ABNORMAL LOW (ref 13.0–17.0)
Immature Granulocytes: 1 %
Lymphocytes Relative: 15 %
Lymphs Abs: 0.6 10*3/uL — ABNORMAL LOW (ref 0.7–4.0)
MCH: 30.4 pg (ref 26.0–34.0)
MCHC: 32.3 g/dL (ref 30.0–36.0)
MCV: 94.3 fL (ref 80.0–100.0)
Monocytes Absolute: 0.2 10*3/uL (ref 0.1–1.0)
Monocytes Relative: 6 %
Neutro Abs: 3.1 10*3/uL (ref 1.7–7.7)
Neutrophils Relative %: 74 %
Platelets: 136 10*3/uL — ABNORMAL LOW (ref 150–400)
RBC: 3.32 MIL/uL — ABNORMAL LOW (ref 4.22–5.81)
RDW: 16.5 % — ABNORMAL HIGH (ref 11.5–15.5)
WBC: 4.2 10*3/uL (ref 4.0–10.5)
nRBC: 0 % (ref 0.0–0.2)

## 2019-07-18 LAB — MAGNESIUM: Magnesium: 2.1 mg/dL (ref 1.7–2.4)

## 2019-07-18 NOTE — Patient Instructions (Addendum)
[facility] at [facility] Discharge Instructions  You were seen today by Dr. Katragadda. He went over your recent results. Dr. Katragadda will see you back in 1 week for labs and follow up.   Thank you for choosing [facility] at [facility] to provide your oncology and hematology care.  To afford each patient quality time with our provider, please arrive at least 15 minutes before your scheduled appointment time.   If you have a lab appointment with the Cancer Center please come in thru the Main Entrance and check in at the main information desk  You need to re-schedule your appointment should you arrive 10 or more minutes late.  We strive to give you quality time with our providers, and arriving late affects you and other patients whose appointments are after yours.  Also, if you no show three or more times for appointments you may be dismissed from the clinic at the providers discretion.     Again, thank you for choosing [facility].  Our hope is that these requests will decrease the amount of time that you wait before being seen by our physicians.       _____________________________________________________________  Should you have questions after your visit to [facility], please contact our office at [phone] between the hours of 8:00 a.m. and 4:30 p.m.  Voicemails left after 4:00 p.m. will not be returned until the following business day.  For prescription refill requests, have your pharmacy contact our office and allow 72 hours.    Cancer Center Support Programs:   > Cancer Support Group  2nd Tuesday of the month 1pm-2pm, Journey Room   

## 2019-07-18 NOTE — Progress Notes (Signed)
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 Focal narrowing and wall thickening of the sigmoid colon is noted in this area most likely due to inflammation, but neoplasm cannot be excluded, and sigmoidoscopy is recommended for further evaluation.   03/20/2016 Procedure   Colonoscopy by Dr. Laural Golden- Malignant partially obstructing tumor in the distal sigmoid colon. Biopsied. - Incomplete exam.   03/23/2016 Pathology Results   Colon, biopsy, sigmoid mass - ADENOCARCINOMA.   03/23/2016 Procedure   Low anterior resection with transanal anastomosis   03/26/2016 Pathology Results   Colon, segmental resection for tumor, sigmoid - INVASIVE WELL DIFFERENTIATED ADENOCARCINOMA, SPANNING 5 CM IN GREATEST DIMENSION. - TUMOR INVADES THROUGH MUSCULARIS PROPRIA TO INVOLVE SUBSEROSAL SOFT TISSUES. - UNDERLYING MARKED ACUTE AND CHRONIC INFLAMMATION WITH GIANT CELL REACTION AND ABSCESS FORMATION EXTENDING TO THE SEROSAL SURFACE AND INVOLVING THE MESENTERIC TISSUE. - MARGINS ARE NEGATIVE FOR TUMOR. - FOURTEEN BENIGN LYMPH NODES WHICH DEMONSTRATE VARIABLE INVOLVEMENT WITH ACUTE AND CHRONIC INFLAMMATION WITH ASSOCIATED GIANT CELLS WITHOUT TUMOR IDENTIFIED (0/14).   04/08/2016 Cancer Staging   Cancer Staging Adenocarcinoma of sigmoid colon [facility]) Staging form: Colon and Rectum, AJCC 8th Edition - Pathologic stage from 04/01/2016: Stage IIA (pT3, pN0, cM0) - Signed by Baird Cancer, PA-C on 04/01/2016    07/09/2016 Imaging   CT abd/pelvis- No evidence of metastatic disease or other acute findings.  Bilateral nonobstructing renal calculi and mild bilateral renal parenchymal scarring.  Stable mildly enlarged prostate gland and findings of chronic bladder outlet obstruction.   08/16/2018 -  Chemotherapy   The patient had palonosetron (ALOXI) injection 0.25 mg, 0.25 mg, Intravenous,  Once, 12 of 12 cycles Administration: 0.25 mg (08/16/2018), 0.25 mg (09/07/2018), 0.25 mg (10/19/2018), 0.25 mg (11/02/2018), 0.25 mg (11/16/2018), 0.25 mg (11/30/2018), 0.25 mg (03/21/2019), 0.25 mg (04/04/2019), 0.25 mg (04/18/2019), 0.25 mg (05/02/2019), 0.25 mg (05/17/2019), 0.25 mg (05/31/2019) pegfilgrastim (NEULASTA) injection 6 mg, 6 mg, Subcutaneous, Once, 12 of 12 cycles Administration: 6 mg (08/18/2018), 6 mg (09/09/2018), 6 mg (10/21/2018), 6 mg (11/04/2018), 6 mg (11/18/2018), 6 mg (12/02/2018), 6 mg (03/23/2019), 6 mg (04/06/2019), 6 mg (04/20/2019), 6 mg (05/04/2019), 6 mg (05/19/2019), 6 mg (06/02/2019) irinotecan (CAMPTOSAR) 380 mg in sodium chloride 0.9 % 500 mL chemo infusion, 165 mg/m2 = 380 mg, Intravenous,  Once, 1 of 1 cycle Administration: 380 mg (08/16/2018) leucovorin 900 mg in dextrose 5 % 250 mL infusion, 400 mg/m2 = 900 mg, Intravenous,  Once, 12 of 12 cycles Administration: 900 mg (08/16/2018), 900 mg (09/07/2018), 900 mg (10/19/2018), 900 mg (11/02/2018), 900 mg (11/16/2018), 800 mg (11/30/2018), 900 mg (03/21/2019), 900 mg (04/04/2019), 900 mg (04/18/2019), 900 mg (05/02/2019), 900 mg (05/17/2019), 800 mg (05/31/2019) oxaliplatin (ELOXATIN) 190 mg in dextrose 5 % 500 mL chemo infusion, 85 mg/m2 = 190 mg, Intravenous,  Once, 12 of 12 cycles Dose modification: 68 mg/m2 (80 % of original dose 85 mg/m2, Cycle 2, Reason: Provider Judgment), 68 mg/m2 (80 % of original dose 85 mg/m2, Cycle 3, Reason: Provider Judgment) Administration:  190 mg (08/16/2018), 150 mg (09/07/2018), 150 mg (10/19/2018), 150 mg (11/16/2018),  150 mg (11/02/2018), 135 mg (11/30/2018), 150 mg (03/21/2019), 150 mg (04/04/2019), 150 mg (04/18/2019), 150 mg (05/02/2019), 150 mg (05/17/2019), 130 mg (05/31/2019) fluorouracil (ADRUCIL) chemo injection 700 mg, 320 mg/m2 = 700 mg (100 % of original dose 320 mg/m2), Intravenous,  Once, 9 of 9 cycles Dose modification: 320 mg/m2 (original dose 320 mg/m2, Cycle 4) Administration: 700 mg (11/02/2018), 700 mg (11/16/2018), 650 mg (11/30/2018), 700 mg (03/21/2019), 700 mg (04/04/2019), 700 mg (04/18/2019), 700 mg (05/02/2019), 700 mg (05/17/2019), 700 mg (05/31/2019) fosaprepitant (EMEND) 150 mg, dexamethasone (DECADRON) 12 mg in sodium chloride 0.9 % 145 mL IVPB, , Intravenous,  Once, 12 of 12 cycles Administration:  (08/16/2018),  (09/07/2018),  (10/19/2018),  (11/02/2018),  (11/16/2018),  (11/30/2018),  (03/21/2019),  (04/04/2019),  (04/18/2019),  (05/02/2019),  (05/17/2019),  (05/31/2019) fluorouracil (ADRUCIL) 5,400 mg in sodium chloride 0.9 % 142 mL chemo infusion, 2,400 mg/m2 = 5,400 mg, Intravenous, 1 Day/Dose, 12 of 12 cycles Dose modification: 1,920 mg/m2 (80 % of original dose 2,400 mg/m2, Cycle 3, Reason: Provider Judgment) Administration: 5,400 mg (08/16/2018), 5,400 mg (09/07/2018), 4,300 mg (10/19/2018), 4,300 mg (11/02/2018), 4,300 mg (11/16/2018), 3,850 mg (11/30/2018), 4,300 mg (03/21/2019), 4,300 mg (04/04/2019), 4,300 mg (04/18/2019), 4,300 mg (05/02/2019), 4,300 mg (05/17/2019), 3,850 mg (05/31/2019) bevacizumab-awwb (MVASI) 500 mg in sodium chloride 0.9 % 100 mL chemo infusion, 5 mg/kg = 500 mg (100 % of original dose 5 mg/kg), Intravenous,  Once, 6 of 6 cycles Dose modification: 5 mg/kg (original dose 5 mg/kg, Cycle 1), 5 mg/kg (original dose 5 mg/kg, Cycle 2) Administration: 500 mg (08/16/2018), 500 mg (09/07/2018), 500 mg (10/19/2018), 500 mg (11/02/2018), 450 mg (11/16/2018), 400 mg (11/30/2018)  for chemotherapy treatment.      CANCER STAGING: Cancer Staging Adenocarcinoma of sigmoid colon [facility]) Staging form: Colon and Rectum, AJCC  8th Edition - Pathologic stage from 04/01/2016: Stage IIA (pT3, pN0, cM0) - Signed by Baird Cancer, PA-C on 04/01/2016   INTERVAL HISTORY:  Troy Smith, a 64 y.o. 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 Devon was last seen on 07/11/2019.  Today he reports feeling well. He reports having some diarrhea, but denies having mouth sores or rashes. He takes Imodium as needed for his diarrhea.    REVIEW OF SYSTEMS:  Review of Systems  Constitutional: Positive for appetite change (mildly decreased) and fatigue (mild).  Gastrointestinal: Positive for diarrhea.  All other systems reviewed and are negative.   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[place]) 03/23/2016   Carpal tunnel syndrome 05/02/2014   Bilateral   Cervical disc disorder with radiculopathy of cervical region 05/02/2014   Left C8   Diabetes mellitus without complication ([place])    Hypertension    Port-A-Cath in place 08/10/2018   Past Surgical History:  Procedure Laterality Date   BIOPSY  03/20/2016   Procedure: BIOPSY;  Surgeon: Rogene Houston, MD;  Location: [facility];  Service: Endoscopy;;  colon   BOWEL RESECTION N/A 03/23/2016   Procedure: LOW ANTERIOR RESECTION;  Surgeon: Aviva Signs, MD;  Location: [facility] ORS;  Service: General;  Laterality: N/A;   COLON SURGERY  01/2019   Done at [place] 03/20/2016   Procedure: COLONOSCOPY;  Surgeon: Rogene Houston, MD;  Location: [facility];  Service: Endoscopy;  Laterality: N/A;  730   COLONOSCOPY  N/A 07/10/2016   Procedure: COLONOSCOPY;  Surgeon: Rogene Houston, MD;  Location: [facility];  Service: Endoscopy;  Laterality: N/A;  1040   NO PAST SURGERIES     POLYPECTOMY  07/10/2016   Procedure: POLYPECTOMY;  Surgeon: Rogene Houston, MD;  Location: [facility];  Service: Endoscopy;;  colon   PORTACATH PLACEMENT Left 08/05/2018    Procedure: INSERTION PORT-A-CATH;  Surgeon: Aviva Signs, MD;  Location: [facility] ORS;  Service: General;  Laterality: Left;   TONSILLECTOMY      SOCIAL HISTORY:  Social History   Socioeconomic History   Marital status: Married    Spouse name: Not on file   Number of children: Not on file   Years of education: Not on file   Highest education level: Not on file  Occupational History   Not on file  Tobacco Use   Smoking status: Never Smoker   Smokeless tobacco: Never Used  Substance and Sexual Activity   Alcohol use: Yes    Comment: rarely   Drug use: No   Sexual activity: Not on file  Other Topics Concern   Not on file  Social History Narrative   Not on file   Social Determinants of Health   Financial Resource Strain:    Difficulty of Paying Living Expenses:   Food Insecurity:    Worried About Charity fundraiser in the Last Year:    Arboriculturist in the Last Year:   Transportation Needs:    Film/video editor (Medical):    Lack of Transportation (Non-Medical):   Physical Activity:    Days of Exercise per Week:    Minutes of Exercise per Session:   Stress:    Feeling of Stress :   Social Connections:    Frequency of Communication with Friends and Family:    Frequency of Social Gatherings with Friends and Family:    Attends Religious Services:    Active Member of Clubs or Organizations:    Attends Music therapist:    Marital Status:   Intimate Partner Violence:    Fear of Current or Ex-Partner:    Emotionally Abused:    Physically Abused:    Sexually Abused:     FAMILY HISTORY:  Family History  Problem Relation Age of Onset   COPD Mother    Melanoma Father     CURRENT MEDICATIONS:  Current Outpatient Medications  Medication Sig Dispense Refill   allopurinol (ZYLOPRIM) 300 MG tablet TAKE ONE TABLET BY MOUTH ONCE DAILY. 30 tablet 0   amLODipine (NORVASC) 10 MG tablet Take 10 mg by mouth daily.      Bevacizumab (AVASTIN IV) Inject into the vein every 14 (fourteen) days.     capecitabine (XELODA) 500 MG tablet Take 3 tablets (1,500 mg total) by mouth 2 (two) times daily after a meal. Take Monday through Friday. Take only on days of radiation. (Patient taking differently: Take 500 mg by mouth 2 (two) times daily after a meal. Take Monday through Friday. Take only on days of radiation.) 150 tablet 0   dextrose 5 % SOLN 1,000 mL with fluorouracil 5 GM/100ML SOLN Inject into the vein over 48 hr.     diphenoxylate-atropine (LOMOTIL) 2.5-0.025 MG tablet Take 1 tablet by mouth 4 (four) times daily as needed for diarrhea or loose stools. Take 2 tablets at the first onset of diarrhea followed by 1 tablet after every watery bowel movement. 60 tablet 2   donepezil (ARICEPT) 10  MG tablet TAKE (1) TABLET BY MOUTH AT BEDTIME. 30 tablet 0   febuxostat (ULORIC) 40 MG tablet Take 1 tablet (40 mg total) by mouth daily. 30 tablet 2   FLUOROURACIL IV Inject into the vein every 14 (fourteen) days.     insulin glargine (LANTUS) 100 UNIT/ML injection Inject 30 Units into the skin at bedtime.      IRINOTECAN HCL IV Inject into the vein every 14 (fourteen) days.     LEUCOVORIN CALCIUM IV Inject into the vein every 14 (fourteen) days.     lidocaine-prilocaine (EMLA) cream Apply a small amount to port a cath site and cover with plastic wrap 1 hour prior to chemotherapy appointments 30 g 3   loperamide (IMODIUM A-D) 2 MG tablet Take 2 at onset of diarrhea, then 1 after each watery bowel movement. 100 tablet 1   magnesium oxide (MAG-OX) 400 (241.3 Mg) MG tablet Take 1 tablet (400 mg total) by mouth 2 (two) times daily. 60 tablet 2   metoprolol succinate (TOPROL-XL) 50 MG 24 hr tablet Take 50 mg by mouth daily. Take with or immediately following a meal.     MITIGARE 0.6 MG CAPS Take 1 capsule by mouth as needed.      OXALIPLATIN IV Inject into the vein every 14 (fourteen) days.     prochlorperazine (COMPAZINE)  10 MG tablet Take 1 tablet (10 mg total) by mouth every 6 (six) hours as needed (Nausea or vomiting). 60 tablet 1   scopolamine (TRANSDERM-SCOP) 1 MG/3DAYS Place 1 patch (1.5 mg total) onto the skin every 3 (three) days. 10 patch 12   No current facility-administered medications for this visit.    ALLERGIES:  Allergies  Allergen Reactions   Penicillin G Nausea And Vomiting    PHYSICAL EXAM:  Performance status (ECOG): 1 - Symptomatic but completely ambulatory  There were no vitals filed for this visit. Wt Readings from Last 3 Encounters:  07/11/19 180 lb 4.8 oz (81.8 kg)  07/03/19 175 lb 3.2 oz (79.5 kg)  06/26/19 177 lb 4.8 oz (80.4 kg)   Physical Exam Vitals reviewed.  Constitutional:      Appearance: Normal appearance.  HENT:     Mouth/Throat:     Lips: No lesions.     Mouth: No oral lesions.  Cardiovascular:     Rate and Rhythm: Normal rate and regular rhythm.     Pulses: Normal pulses.     Heart sounds: Normal heart sounds.  Pulmonary:     Effort: Pulmonary effort is normal.     Breath sounds: Normal breath sounds.  Musculoskeletal:     Right lower leg: Edema (+1) present.     Left lower leg: Edema (+1) present.  Neurological:     General: No focal deficit present.     Mental Status: He is alert and oriented to person, place, and time.  Psychiatric:        Mood and Affect: Mood normal.        Behavior: Behavior normal.      LABORATORY DATA:  I have reviewed the labs as listed.  CBC Latest Ref Rng & Units 07/11/2019 07/03/2019 06/26/2019  WBC 4.0 - 10.5 K/uL 2.7(L) 4.0 6.7  Hemoglobin 13.0 - 17.0 g/dL 9.6(L) 9.9(L) 9.6(L)  Hematocrit 39.0 - 52.0 % 30.0(L) 33.5(L) 30.9(L)  Platelets 150 - 400 K/uL 110(L) 144(L) 149(L)   CMP Latest Ref Rng & Units 07/11/2019 07/03/2019 06/26/2019  Glucose 70 - 99 mg/dL 143(H) 127(H) 113(H)  BUN 8 -  23 mg/dL 14 19 21   Creatinine 0.61 - 1.24 mg/dL 1.13 1.12 0.93  Sodium 135 - 145 mmol/L 143 140 141  Potassium 3.5 - 5.1 mmol/L  3.5 4.8 3.6  Chloride 98 - 111 mmol/L 107 105 107  CO2 22 - 32 mmol/L 27 27 25   Calcium 8.9 - 10.3 mg/dL 9.6 9.7 9.4  Total Protein 6.5 - 8.1 g/dL 6.1(L) 6.3(L) 6.6  Total Bilirubin 0.3 - 1.2 mg/dL 0.6 1.1 0.7  Alkaline Phos 38 - 126 U/L 58 57 69  AST 15 - 41 U/L 11(L) 12(L) 12(L)  ALT 0 - 44 U/L 9 9 9     DIAGNOSTIC IMAGING:  I have independently reviewed the scans and discussed with the patient. CT Head Wo Contrast  Result Date: 06/26/2019 CLINICAL DATA:  Encephalopathy. Confusion. History of colon cancer with Mets to liver. EXAM: CT HEAD WITHOUT CONTRAST TECHNIQUE: Contiguous axial images were obtained from the base of the skull through the vertex without intravenous contrast. COMPARISON:  Brain MRI 09/16/2018 FINDINGS: Brain: No intracranial hemorrhage, mass effect, or midline shift. No evidence of edema to suggest underlying lesion. No hydrocephalus. The basilar cisterns are patent. No evidence of territorial infarct or acute ischemia. No extra-axial or intracranial fluid collection. Vascular: No hyperdense vessel or unexpected calcification. Skull: No fracture or focal lesion. Sinuses/Orbits: Paranasal sinuses and mastoid air cells are clear. The visualized orbits are unremarkable. Other: None. IMPRESSION: Negative noncontrast head CT. Electronically Signed   By: Keith Rake M.D.   On: 06/26/2019 19:41     ASSESSMENT:  1.  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 -Laparoscopic partial hepatectomy, redo low anterior resection on 02/01/2019, pathology showing YPT3Y PN 0.  Positive circumferential/radial/mesenteric margin. -6 more cycles of FOLFOX completed on 05/31/2019. -Xeloda with radiation started on 06/26/2019.   PLAN:  1.  Metastatic sigmoid colon cancer to the liver: -Continue Xeloda 2 tablets  twice daily Monday through Friday. -Labs show white count 4.2 and platelets 136. -Creatinine has increased to 1.41.  Recommended drinking plenty of fluids. -We will monitor him again in 1 week.  2.  Diarrhea: -Continue Lomotil 2 tablets 3 times a day.  3.  Hypomagnesemia: -Continue magnesium twice daily.  His magnesium today is 2.1.  4.  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  Neurology referral made.  4.  Gout: -Continue Uloric 40 mg daily.   Orders placed this encounter:  No orders of the defined types were placed in this encounter.    Derek Jack, MD [place] [phone]   I, Milinda Antis, am acting as a scribe for Dr. Sanda Linger.  I, Derek Jack MD, have reviewed the above documentation for accuracy and completeness, and I agree with the above.

## 2019-07-19 ENCOUNTER — Ambulatory Visit
Admission: RE | Admit: 2019-07-19 | Discharge: 2019-07-19 | Disposition: A | Discharge status: Home / Self Care | Payer: 59 | Source: Ambulatory Visit | Attending: Radiation Oncology | Admitting: Radiation Oncology

## 2019-07-19 ENCOUNTER — Encounter (HOSPITAL_COMMUNITY): Payer: Self-pay | Admitting: *Deleted

## 2019-07-19 ENCOUNTER — Other Ambulatory Visit: Payer: Self-pay

## 2019-07-19 DIAGNOSIS — Z510 Encounter for antineoplastic radiation therapy: Secondary | ICD-10-CM | POA: Diagnosis not present

## 2019-07-19 LAB — CEA: CEA: 1.6 ng/mL (ref 0.0–4.7)

## 2019-07-19 NOTE — Progress Notes (Signed)
Per Dr. Delton Coombes, I notified patient that his blood count was mostly okay and in some aspects better than last week in terms of white count and platelets.  Creatinine has gone up to 1.4.  He was advised to drink a lot of fluids, mainly water, 2 to 3 L/day.  He verbalizes understanding.

## 2019-07-20 ENCOUNTER — Other Ambulatory Visit: Payer: Self-pay

## 2019-07-20 ENCOUNTER — Ambulatory Visit
Admission: RE | Admit: 2019-07-20 | Discharge: 2019-07-20 | Disposition: A | Discharge status: Home / Self Care | Payer: 59 | Source: Ambulatory Visit | Attending: Radiation Oncology | Admitting: Radiation Oncology

## 2019-07-20 DIAGNOSIS — Z510 Encounter for antineoplastic radiation therapy: Secondary | ICD-10-CM | POA: Diagnosis not present

## 2019-07-21 ENCOUNTER — Other Ambulatory Visit: Payer: Self-pay

## 2019-07-21 ENCOUNTER — Ambulatory Visit
Admission: RE | Admit: 2019-07-21 | Discharge: 2019-07-21 | Disposition: A | Discharge status: Home / Self Care | Payer: 59 | Source: Ambulatory Visit | Attending: Radiation Oncology | Admitting: Radiation Oncology

## 2019-07-21 DIAGNOSIS — Z510 Encounter for antineoplastic radiation therapy: Secondary | ICD-10-CM | POA: Diagnosis not present

## 2019-07-24 ENCOUNTER — Inpatient Hospital Stay (HOSPITAL_COMMUNITY): Payer: 59

## 2019-07-24 ENCOUNTER — Ambulatory Visit
Admission: RE | Admit: 2019-07-24 | Discharge: 2019-07-24 | Disposition: A | Discharge status: Home / Self Care | Payer: 59 | Source: Ambulatory Visit | Attending: Radiation Oncology | Admitting: Radiation Oncology

## 2019-07-24 ENCOUNTER — Other Ambulatory Visit: Payer: Self-pay

## 2019-07-24 DIAGNOSIS — Z510 Encounter for antineoplastic radiation therapy: Secondary | ICD-10-CM | POA: Diagnosis not present

## 2019-07-24 DIAGNOSIS — C187 Malignant neoplasm of sigmoid colon: Secondary | ICD-10-CM | POA: Diagnosis not present

## 2019-07-24 LAB — CBC WITH DIFFERENTIAL/PLATELET
Abs Immature Granulocytes: 0.01 10*3/uL (ref 0.00–0.07)
Basophils Absolute: 0 10*3/uL (ref 0.0–0.1)
Basophils Relative: 0 %
Eosinophils Absolute: 0.2 10*3/uL (ref 0.0–0.5)
Eosinophils Relative: 4 %
HCT: 29 % — ABNORMAL LOW (ref 39.0–52.0)
Hemoglobin: 9.2 g/dL — ABNORMAL LOW (ref 13.0–17.0)
Immature Granulocytes: 0 %
Lymphocytes Relative: 16 %
Lymphs Abs: 0.7 10*3/uL (ref 0.7–4.0)
MCH: 30.1 pg (ref 26.0–34.0)
MCHC: 31.7 g/dL (ref 30.0–36.0)
MCV: 94.8 fL (ref 80.0–100.0)
Monocytes Absolute: 0.2 10*3/uL (ref 0.1–1.0)
Monocytes Relative: 6 %
Neutro Abs: 3.2 10*3/uL (ref 1.7–7.7)
Neutrophils Relative %: 74 %
Platelets: 151 10*3/uL (ref 150–400)
RBC: 3.06 MIL/uL — ABNORMAL LOW (ref 4.22–5.81)
RDW: 16.6 % — ABNORMAL HIGH (ref 11.5–15.5)
WBC: 4.3 10*3/uL (ref 4.0–10.5)
nRBC: 0 % (ref 0.0–0.2)

## 2019-07-24 LAB — COMPREHENSIVE METABOLIC PANEL
ALT: 10 U/L (ref 0–44)
AST: 13 U/L — ABNORMAL LOW (ref 15–41)
Albumin: 3.8 g/dL (ref 3.5–5.0)
Alkaline Phosphatase: 53 U/L (ref 38–126)
Anion gap: 8 (ref 5–15)
BUN: 17 mg/dL (ref 8–23)
CO2: 26 mmol/L (ref 22–32)
Calcium: 9.5 mg/dL (ref 8.9–10.3)
Chloride: 105 mmol/L (ref 98–111)
Creatinine, Ser: 1.59 mg/dL — ABNORMAL HIGH (ref 0.61–1.24)
GFR calc Af Amer: 53 mL/min — ABNORMAL LOW (ref >60)
GFR calc non Af Amer: 46 mL/min — ABNORMAL LOW (ref >60)
Glucose, Bld: 124 mg/dL — ABNORMAL HIGH (ref 70–99)
Potassium: 3.6 mmol/L (ref 3.5–5.1)
Sodium: 139 mmol/L (ref 135–145)
Total Bilirubin: 1.2 mg/dL (ref 0.3–1.2)
Total Protein: 6.2 g/dL — ABNORMAL LOW (ref 6.5–8.1)

## 2019-07-24 LAB — MAGNESIUM: Magnesium: 1.9 mg/dL (ref 1.7–2.4)

## 2019-07-25 ENCOUNTER — Other Ambulatory Visit: Payer: Self-pay

## 2019-07-25 ENCOUNTER — Inpatient Hospital Stay (HOSPITAL_BASED_OUTPATIENT_CLINIC_OR_DEPARTMENT_OTHER): Payer: 59 | Admitting: Hematology

## 2019-07-25 ENCOUNTER — Ambulatory Visit
Admission: RE | Admit: 2019-07-25 | Discharge: 2019-07-25 | Disposition: A | Discharge status: Home / Self Care | Payer: 59 | Source: Ambulatory Visit | Attending: Radiation Oncology | Admitting: Radiation Oncology

## 2019-07-25 VITALS — BP 106/70 | HR 57 | Temp 96.0°F | Resp 18 | Wt 178.7 lb

## 2019-07-25 DIAGNOSIS — C187 Malignant neoplasm of sigmoid colon: Secondary | ICD-10-CM

## 2019-07-25 DIAGNOSIS — Z510 Encounter for antineoplastic radiation therapy: Secondary | ICD-10-CM | POA: Diagnosis not present

## 2019-07-25 NOTE — Patient Instructions (Signed)
[place] at [facility] Discharge Instructions  You were seen today by Dr. Delton Coombes. He went over your recent results. Please drink plenty of water. You will be seen by the NP or PA in 1 week for possible fluids, labs and follow up.   Thank you for choosing [place] at [facility] to provide your oncology and hematology care.  To afford each patient quality time with our provider, please arrive at least 15 minutes before your scheduled appointment time.   If you have a lab appointment with the [facility] please come in thru the Main Entrance and check in at the main information desk  You need to re-schedule your appointment should you arrive 10 or more minutes late.  We strive to give you quality time with our providers, and arriving late affects you and other patients whose appointments are after yours.  Also, if you no show three or more times for appointments you may be dismissed from the clinic at the providers discretion.     Again, thank you for choosing [facility].  Our hope is that these requests will decrease the amount of time that you wait before being seen by our physicians.       _____________________________________________________________  Should you have questions after your visit to [facility], please contact our office at [phone] between the hours of 8:00 a.m. and 4:30 p.m.  Voicemails left after 4:00 p.m. will not be returned until the following business day.  For prescription refill requests, have your pharmacy contact our office and allow 72 hours.    Cancer Center Support Programs:   > Cancer Support Group  2nd Tuesday of the month 1pm-2pm, Journey Room

## 2019-07-25 NOTE — Progress Notes (Signed)
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 Focal narrowing and wall thickening of the sigmoid colon is noted in this area most likely due to inflammation, but neoplasm cannot be excluded, and sigmoidoscopy is recommended for further evaluation.   03/20/2016 Procedure   Colonoscopy by Dr. Laural Golden- Malignant partially obstructing tumor in the distal sigmoid colon. Biopsied. - Incomplete exam.   03/23/2016 Pathology Results   Colon, biopsy, sigmoid mass - ADENOCARCINOMA.   03/23/2016 Procedure   Low anterior resection with transanal anastomosis   03/26/2016 Pathology Results   Colon, segmental resection for tumor, sigmoid - INVASIVE WELL DIFFERENTIATED ADENOCARCINOMA, SPANNING 5 CM IN GREATEST DIMENSION. - TUMOR INVADES THROUGH MUSCULARIS PROPRIA TO INVOLVE SUBSEROSAL SOFT TISSUES. - UNDERLYING MARKED ACUTE AND CHRONIC INFLAMMATION WITH GIANT CELL REACTION AND ABSCESS FORMATION EXTENDING TO THE SEROSAL SURFACE AND INVOLVING THE MESENTERIC TISSUE. - MARGINS ARE NEGATIVE FOR TUMOR. - FOURTEEN BENIGN LYMPH NODES WHICH DEMONSTRATE VARIABLE INVOLVEMENT WITH ACUTE AND CHRONIC INFLAMMATION WITH ASSOCIATED GIANT CELLS WITHOUT TUMOR IDENTIFIED (0/14).   04/08/2016 Cancer Staging   Cancer Staging Adenocarcinoma of sigmoid colon [facility]) Staging form: Colon and Rectum, AJCC 8th Edition - Pathologic stage from 04/01/2016: Stage IIA (pT3, pN0, cM0) - Signed by Baird Cancer, PA-C on 04/01/2016    07/09/2016 Imaging   CT abd/pelvis- No evidence of metastatic disease or other acute findings.  Bilateral nonobstructing renal calculi and mild bilateral renal parenchymal scarring.  Stable mildly enlarged prostate gland and findings of chronic bladder outlet obstruction.   08/16/2018 -  Chemotherapy   The patient had palonosetron (ALOXI) injection 0.25 mg, 0.25 mg, Intravenous,  Once, 12 of 12 cycles Administration: 0.25 mg (08/16/2018), 0.25 mg (09/07/2018), 0.25 mg (10/19/2018), 0.25 mg (11/02/2018), 0.25 mg (11/16/2018), 0.25 mg (11/30/2018), 0.25 mg (03/21/2019), 0.25 mg (04/04/2019), 0.25 mg (04/18/2019), 0.25 mg (05/02/2019), 0.25 mg (05/17/2019), 0.25 mg (05/31/2019) pegfilgrastim (NEULASTA) injection 6 mg, 6 mg, Subcutaneous, Once, 12 of 12 cycles Administration: 6 mg (08/18/2018), 6 mg (09/09/2018), 6 mg (10/21/2018), 6 mg (11/04/2018), 6 mg (11/18/2018), 6 mg (12/02/2018), 6 mg (03/23/2019), 6 mg (04/06/2019), 6 mg (04/20/2019), 6 mg (05/04/2019), 6 mg (05/19/2019), 6 mg (06/02/2019) irinotecan (CAMPTOSAR) 380 mg in sodium chloride 0.9 % 500 mL chemo infusion, 165 mg/m2 = 380 mg, Intravenous,  Once, 1 of 1 cycle Administration: 380 mg (08/16/2018) leucovorin 900 mg in dextrose 5 % 250 mL infusion, 400 mg/m2 = 900 mg, Intravenous,  Once, 12 of 12 cycles Administration: 900 mg (08/16/2018), 900 mg (09/07/2018), 900 mg (10/19/2018), 900 mg (11/02/2018), 900 mg (11/16/2018), 800 mg (11/30/2018), 900 mg (03/21/2019), 900 mg (04/04/2019), 900 mg (04/18/2019), 900 mg (05/02/2019), 900 mg (05/17/2019), 800 mg (05/31/2019) oxaliplatin (ELOXATIN) 190 mg in dextrose 5 % 500 mL chemo infusion, 85 mg/m2 = 190 mg, Intravenous,  Once, 12 of 12 cycles Dose modification: 68 mg/m2 (80 % of original dose 85 mg/m2, Cycle 2, Reason: Provider Judgment), 68 mg/m2 (80 % of original dose 85 mg/m2, Cycle 3, Reason: Provider Judgment) Administration:  190 mg (08/16/2018), 150 mg (09/07/2018), 150 mg (10/19/2018), 150 mg (11/16/2018),  150 mg (11/02/2018), 135 mg (11/30/2018), 150 mg (03/21/2019), 150 mg (04/04/2019), 150 mg (04/18/2019), 150 mg (05/02/2019), 150 mg (05/17/2019), 130 mg (05/31/2019) fluorouracil (ADRUCIL) chemo injection 700 mg, 320 mg/m2 = 700 mg (100 % of original dose 320 mg/m2), Intravenous,  Once, 9 of 9 cycles Dose modification: 320 mg/m2 (original dose 320 mg/m2, Cycle 4) Administration: 700 mg (11/02/2018), 700 mg (11/16/2018), 650 mg (11/30/2018), 700 mg (03/21/2019), 700 mg (04/04/2019), 700 mg (04/18/2019), 700 mg (05/02/2019), 700 mg (05/17/2019), 700 mg (05/31/2019) fosaprepitant (EMEND) 150 mg, dexamethasone (DECADRON) 12 mg in sodium chloride 0.9 % 145 mL IVPB, , Intravenous,  Once, 12 of 12 cycles Administration:  (08/16/2018),  (09/07/2018),  (10/19/2018),  (11/02/2018),  (11/16/2018),  (11/30/2018),  (03/21/2019),  (04/04/2019),  (04/18/2019),  (05/02/2019),  (05/17/2019),  (05/31/2019) fluorouracil (ADRUCIL) 5,400 mg in sodium chloride 0.9 % 142 mL chemo infusion, 2,400 mg/m2 = 5,400 mg, Intravenous, 1 Day/Dose, 12 of 12 cycles Dose modification: 1,920 mg/m2 (80 % of original dose 2,400 mg/m2, Cycle 3, Reason: Provider Judgment) Administration: 5,400 mg (08/16/2018), 5,400 mg (09/07/2018), 4,300 mg (10/19/2018), 4,300 mg (11/02/2018), 4,300 mg (11/16/2018), 3,850 mg (11/30/2018), 4,300 mg (03/21/2019), 4,300 mg (04/04/2019), 4,300 mg (04/18/2019), 4,300 mg (05/02/2019), 4,300 mg (05/17/2019), 3,850 mg (05/31/2019) bevacizumab-awwb (MVASI) 500 mg in sodium chloride 0.9 % 100 mL chemo infusion, 5 mg/kg = 500 mg (100 % of original dose 5 mg/kg), Intravenous,  Once, 6 of 6 cycles Dose modification: 5 mg/kg (original dose 5 mg/kg, Cycle 1), 5 mg/kg (original dose 5 mg/kg, Cycle 2) Administration: 500 mg (08/16/2018), 500 mg (09/07/2018), 500 mg (10/19/2018), 500 mg (11/02/2018), 450 mg (11/16/2018), 400 mg (11/30/2018)  for chemotherapy treatment.      CANCER STAGING: Cancer Staging Adenocarcinoma of sigmoid colon [facility]) Staging form: Colon and Rectum, AJCC  8th Edition - Pathologic stage from 04/01/2016: Stage IIA (pT3, pN0, cM0) - Signed by Baird Cancer, PA-C on 04/01/2016   INTERVAL HISTORY:  [name], a 64 y.o. 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 [place]ravious was last seen on 07/18/2019.  Today he is accompanied by his wife. His diarrhea is stable. He still reports burning with defecation, but denies melena or hematochezia. His appetite is fine. He is still taking magnesium.  His radiation therapy ends on 08/03/2019.   REVIEW OF SYSTEMS:  Review of Systems  Constitutional: Positive for appetite change (mildly decreased) and fatigue (mild).  Cardiovascular: Negative for leg swelling.  Gastrointestinal: Positive for diarrhea. Negative for abdominal pain and blood in stool.  All other systems reviewed and are negative.   PAST MEDICAL/SURGICAL HISTORY:  Past Medical History:  Diagnosis Date  . 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 Carpal tunnel syndrome 05/02/2014   Bilateral  . Cervical disc disorder with radiculopathy of cervical region 05/02/2014   Left C8  . Diabetes mellitus without complication ([place])   . Hypertension   . Port-A-Cath in place 08/10/2018   Past Surgical History:  Procedure Laterality Date  . BIOPSY  03/20/2016   Procedure: BIOPSY;  Surgeon: Rogene Houston, MD;  [facility];  Service: Endoscopy;;  colon  . BOWEL RESECTION N/A 03/23/2016   Procedure: LOW ANTERIOR RESECTION;  Surgeon: Aviva Signs, MD;  Location: [facility] ORS;  Service: General;  Laterality: N/A;  . COLON SURGERY  01/2019   Done at [place] N/A 03/20/2016  Procedure: COLONOSCOPY;  Surgeon: Rogene Houston, MD;  Location: [facility] END[facility];  Service: Endoscopy;  Laterality: N/A;  730  . COLONOSCOPY N/A 07/10/2016   Procedure: COLONOSCOPY;  Surgeon: Rogene Houston, MD;  Location: [facility];  Service: Endoscopy;  Laterality: N/A;  1040  . NO PAST SURGERIES    .  POLYPECTOMY  07/10/2016   Procedure: POLYPECTOMY;  Surgeon: Rogene Houston, MD;  Location: [facility];  Service: Endoscopy;;  colon  . PORTACATH PLACEMENT Left 08/05/2018   Procedure: INSERTION PORT-A-CATH;  Surgeon: Aviva Signs, MD;  Location: [facility] ORS;  Service: General;  Laterality: Left;  . TONSILLECTOMY      SOCIAL HISTORY:  Social History   Socioeconomic History  . Marital status: Married    Spouse name: Not on file  . Number of children: Not on file  . Years of education: Not on file  . Highest education level: Not on file  Occupational History  . Not on file  Tobacco Use  . Smoking status: Never Smoker  . Smokeless tobacco: Never Used  Vaping Use  . Vaping Use: Never used  Substance and Sexual Activity  . Alcohol use: Yes    Comment: rarely  . Drug use: No  . Sexual activity: Not on file  Other Topics Concern  . Not on file  Social History Narrative  . Not on file   Social Determinants of Health   Financial Resource Strain:   . Difficulty of Paying Living Expenses:   Food Insecurity:   . Worried About Charity fundraiser in the Last Year:   . Arboriculturist in the Last Year:   Transportation Needs:   . Film/video editor (Medical):   Marland Kitchen Lack of Transportation (Non-Medical):   Physical Activity:   . Days of Exercise per Week:   . Minutes of Exercise per Session:   Stress:   . Feeling of Stress :   Social Connections:   . Frequency of Communication with Friends and Family:   . Frequency of Social Gatherings with Friends and Family:   . Attends Religious Services:   . Active Member of Clubs or Organizations:   . Attends Archivist Meetings:   Marland Kitchen Marital Status:   Intimate Partner Violence:   . Fear of Current or Ex-Partner:   . Emotionally Abused:   Marland Kitchen Physically Abused:   . Sexually Abused:     FAMILY HISTORY:  Family History  Problem Relation Age of Onset  . COPD Mother   . Melanoma Father     CURRENT MEDICATIONS:  Current  Outpatient Medications  Medication Sig Dispense Refill  . allopurinol (ZYLOPRIM) 300 MG tablet TAKE ONE TABLET BY MOUTH ONCE DAILY. 30 tablet 0  . amLODipine (NORVASC) 10 MG tablet Take 10 mg by mouth daily.    . Bevacizumab (AVASTIN IV) Inject into the vein every 14 (fourteen) days.    . capecitabine (XELODA) 500 MG tablet Take 3 tablets (1,500 mg total) by mouth 2 (two) times daily after a meal. Take Monday through Friday. Take only on days of radiation. (Patient taking differently: Take 500 mg by mouth 2 (two) times daily after a meal. Take Monday through Friday. Take only on days of radiation.) 150 tablet 0  . dextrose 5 % SOLN 1,000 mL with fluorouracil 5 GM/100ML SOLN Inject into the vein over 48 hr.    . diphenoxylate-atropine (LOMOTIL) 2.5-0.025 MG tablet Take 1 tablet by mouth 4 (four) times daily  as needed for diarrhea or loose stools. Take 2 tablets at the first onset of diarrhea followed by 1 tablet after every watery bowel movement. 60 tablet 2  . donepezil (ARICEPT) 10 MG tablet TAKE (1) TABLET BY MOUTH AT BEDTIME. 30 tablet 0  . febuxostat (ULORIC) 40 MG tablet Take 1 tablet (40 mg total) by mouth daily. 30 tablet 2  . FLUOROURACIL IV Inject into the vein every 14 (fourteen) days.    . insulin glargine (LANTUS) 100 UNIT/ML injection Inject 30 Units into the skin at bedtime.     . IRINOTECAN HCL IV Inject into the vein every 14 (fourteen) days.    Marland Kitchen LEUCOVORIN CALCIUM IV Inject into the vein every 14 (fourteen) days.    . magnesium oxide (MAG-OX) 400 (241.3 Mg) MG tablet Take 1 tablet (400 mg total) by mouth 2 (two) times daily. 60 tablet 2  . metoprolol succinate (TOPROL-XL) 50 MG 24 hr tablet Take 50 mg by mouth daily. Take with or immediately following a meal.    . MITIGARE 0.6 MG CAPS Take 1 capsule by mouth as needed.     . OXALIPLATIN IV Inject into the vein every 14 (fourteen) days.    Marland Kitchen scopolamine (TRANSDERM-SCOP) 1 MG/3DAYS Place 1 patch (1.5 mg total) onto the skin every 3  (three) days. 10 patch 12  . lidocaine-prilocaine (EMLA) cream Apply a small amount to port a cath site and cover with plastic wrap 1 hour prior to chemotherapy appointments (Patient not taking: Reported on 07/25/2019) 30 g 3  . loperamide (IMODIUM A-D) 2 MG tablet Take 2 at onset of diarrhea, then 1 after each watery bowel movement. (Patient not taking: Reported on 07/25/2019) 100 tablet 1  . prochlorperazine (COMPAZINE) 10 MG tablet Take 1 tablet (10 mg total) by mouth every 6 (six) hours as needed (Nausea or vomiting). (Patient not taking: Reported on 07/25/2019) 60 tablet 1   No current facility-administered medications for this visit.    ALLERGIES:  Allergies  Allergen Reactions  . Penicillin G Nausea And Vomiting    PHYSICAL EXAM:  Performance status (ECOG): 1 - Symptomatic but completely ambulatory  Vitals:   07/25/19 1027  BP: 106/70  Pulse: (!) 57  Resp: 18  Temp: (!) 96 F (35.6 C)  SpO2: 100%   Wt Readings from Last 3 Encounters:  07/25/19 178 lb 11.2 oz (81.1 kg)  07/18/19 178 lb 12.8 oz (81.1 kg)  07/11/19 180 lb 4.8 oz (81.8 kg)   Physical Exam Vitals reviewed.  Constitutional:      Appearance: Normal appearance.  Cardiovascular:     Rate and Rhythm: Normal rate and regular rhythm.     Pulses: Normal pulses.     Heart sounds: Normal heart sounds.  Pulmonary:     Effort: Pulmonary effort is normal.     Breath sounds: Normal breath sounds.  Abdominal:     Palpations: Abdomen is soft. There is no mass.     Tenderness: There is no abdominal tenderness.  Musculoskeletal:     Right lower leg: Edema (+1) present.     Left lower leg: Edema (+1) present.  Neurological:     General: No focal deficit present.     Mental Status: He is alert and oriented to person, place, and time.  Psychiatric:        Mood and Affect: Mood normal.        Behavior: Behavior normal.      LABORATORY DATA:  I have reviewed the  labs as listed.  CBC Latest Ref Rng & Units 07/24/2019  07/18/2019 07/11/2019  WBC 4.0 - 10.5 K/uL 4.3 4.2 2.7(L)  Hemoglobin 13.0 - 17.0 g/dL 9.2(L) 10.1(L) 9.6(L)  Hematocrit 39 - 52 % 29.0(L) 31.3(L) 30.0(L)  Platelets 150 - 400 K/uL 151 136(L) 110(L)   CMP Latest Ref Rng & Units 07/24/2019 07/18/2019 07/11/2019  Glucose 70 - 99 mg/dL 124(H) 113(H) 143(H)  BUN 8 - 23 mg/dL 17 21 14   Creatinine 0.61 - 1.24 mg/dL 1.59(H) 1.41(H) 1.13  Sodium 135 - 145 mmol/L 139 141 143  Potassium 3.5 - 5.1 mmol/L 3.6 4.5 3.5  Chloride 98 - 111 mmol/L 105 105 107  CO2 22 - 32 mmol/L 26 26 27   Calcium 8.9 - 10.3 mg/dL 9.5 9.6 9.6  Total Protein 6.5 - 8.1 g/dL 6.2(L) 6.4(L) 6.1(L)  Total Bilirubin 0.3 - 1.2 mg/dL 1.2 0.7 0.6  Alkaline Phos 38 - 126 U/L 53 60 58  AST 15 - 41 U/L 13(L) 12(L) 11(L)  ALT 0 - 44 U/L 10 10 9     DIAGNOSTIC IMAGING:  I have independently reviewed the scans and discussed with the patient. CT Head Wo Contrast  Result Date: 06/26/2019 CLINICAL DATA:  Encephalopathy. Confusion. History of colon cancer with Mets to liver. EXAM: CT HEAD WITHOUT CONTRAST TECHNIQUE: Contiguous axial images were obtained from the base of the skull through the vertex without intravenous contrast. COMPARISON:  Brain MRI 09/16/2018 FINDINGS: Brain: No intracranial hemorrhage, mass effect, or midline shift. No evidence of edema to suggest underlying lesion. No hydrocephalus. The basilar cisterns are patent. No evidence of territorial infarct or acute ischemia. No extra-axial or intracranial fluid collection. Vascular: No hyperdense vessel or unexpected calcification. Skull: No fracture or focal lesion. Sinuses/Orbits: Paranasal sinuses and mastoid air cells are clear. The visualized orbits are unremarkable. Other: None. IMPRESSION: Negative noncontrast head CT. Electronically Signed   By: Keith Rake M.D.   On: 06/26/2019 19:41     ASSESSMENT:  1. 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 -Single  copy UG T1 A1*28 allele indicating increased risk of neutropenia and other side effects from Irinotecan. -Laparoscopic partial hepatectomy, redo low anterior resection on 02/01/2019, pathology showing YPT3Y PN 0. Positive circumferential/radial/mesenteric margin. -6 more cycles of FOLFOX completed on 05/31/2019. -Xeloda with radiation started on 06/26/2019.   PLAN:  1. Metastatic sigmoid colon cancer to the liver: -He is continuing to tolerate radiation and Xeloda very well. -Continue Xeloda 2 tablets twice daily, Monday through Friday. -Review of labs showed slightly elevated creatinine of 1.59.  This has gone up last couple of times.  Likely from Xeloda. -I have asked him to drink 2 to 3 L of water daily. -We will reevaluate him in 1 week.  If it continues to go up, will consider fluids.  2. Diarrhea: -Continue Lomotil 2 tablets 3 times a day.  3. Hypomagnesemia: -Continue magnesium twice daily.  Magnesium today is 1.9.  4. 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  Neurology referral made.  Appointment in July.  4. Gout: -Continue Uloric 40 mg daily.   Orders placed this encounter:  No orders of the defined types were placed in this encounter.    Derek Jack, MD [place] [phone]   I, Milinda Antis, am acting as a scribe for Dr. Sanda Linger.  I, Derek Jack MD, have reviewed the above documentation for accuracy and completeness, and I agree with the above.

## 2019-07-26 ENCOUNTER — Ambulatory Visit
Admission: RE | Admit: 2019-07-26 | Discharge: 2019-07-26 | Disposition: A | Discharge status: Home / Self Care | Payer: 59 | Source: Ambulatory Visit | Attending: Radiation Oncology | Admitting: Radiation Oncology

## 2019-07-26 ENCOUNTER — Other Ambulatory Visit: Payer: Self-pay

## 2019-07-26 DIAGNOSIS — Z510 Encounter for antineoplastic radiation therapy: Secondary | ICD-10-CM | POA: Diagnosis not present

## 2019-07-27 ENCOUNTER — Other Ambulatory Visit: Payer: Self-pay

## 2019-07-27 ENCOUNTER — Ambulatory Visit
Admission: RE | Admit: 2019-07-27 | Discharge: 2019-07-27 | Disposition: A | Discharge status: Home / Self Care | Payer: 59 | Source: Ambulatory Visit | Attending: Radiation Oncology | Admitting: Radiation Oncology

## 2019-07-27 DIAGNOSIS — Z510 Encounter for antineoplastic radiation therapy: Secondary | ICD-10-CM | POA: Diagnosis not present

## 2019-07-28 ENCOUNTER — Other Ambulatory Visit: Payer: Self-pay

## 2019-07-28 ENCOUNTER — Ambulatory Visit
Admission: RE | Admit: 2019-07-28 | Discharge: 2019-07-28 | Disposition: A | Discharge status: Home / Self Care | Payer: 59 | Source: Ambulatory Visit | Attending: Radiation Oncology | Admitting: Radiation Oncology

## 2019-07-28 DIAGNOSIS — Z510 Encounter for antineoplastic radiation therapy: Secondary | ICD-10-CM | POA: Diagnosis not present

## 2019-07-31 ENCOUNTER — Other Ambulatory Visit: Payer: Self-pay

## 2019-07-31 ENCOUNTER — Ambulatory Visit
Admission: RE | Admit: 2019-07-31 | Discharge: 2019-07-31 | Disposition: A | Discharge status: Home / Self Care | Payer: 59 | Source: Ambulatory Visit | Attending: Radiation Oncology | Admitting: Radiation Oncology

## 2019-07-31 DIAGNOSIS — Z510 Encounter for antineoplastic radiation therapy: Secondary | ICD-10-CM | POA: Diagnosis not present

## 2019-08-01 ENCOUNTER — Ambulatory Visit
Admission: RE | Admit: 2019-08-01 | Discharge: 2019-08-01 | Disposition: A | Discharge status: Home / Self Care | Payer: 59 | Source: Ambulatory Visit | Attending: Radiation Oncology | Admitting: Radiation Oncology

## 2019-08-01 ENCOUNTER — Inpatient Hospital Stay (HOSPITAL_COMMUNITY): Payer: 59

## 2019-08-01 ENCOUNTER — Other Ambulatory Visit: Payer: Self-pay

## 2019-08-01 DIAGNOSIS — C187 Malignant neoplasm of sigmoid colon: Secondary | ICD-10-CM

## 2019-08-01 DIAGNOSIS — Z510 Encounter for antineoplastic radiation therapy: Secondary | ICD-10-CM | POA: Diagnosis not present

## 2019-08-01 LAB — CBC WITH DIFFERENTIAL/PLATELET
Abs Immature Granulocytes: 0.02 10*3/uL (ref 0.00–0.07)
Basophils Absolute: 0 10*3/uL (ref 0.0–0.1)
Basophils Relative: 0 %
Eosinophils Absolute: 0.3 10*3/uL (ref 0.0–0.5)
Eosinophils Relative: 6 %
HCT: 28.2 % — ABNORMAL LOW (ref 39.0–52.0)
Hemoglobin: 8.9 g/dL — ABNORMAL LOW (ref 13.0–17.0)
Immature Granulocytes: 1 %
Lymphocytes Relative: 10 %
Lymphs Abs: 0.4 10*3/uL — ABNORMAL LOW (ref 0.7–4.0)
MCH: 30.6 pg (ref 26.0–34.0)
MCHC: 31.6 g/dL (ref 30.0–36.0)
MCV: 96.9 fL (ref 80.0–100.0)
Monocytes Absolute: 0.3 10*3/uL (ref 0.1–1.0)
Monocytes Relative: 7 %
Neutro Abs: 3.3 10*3/uL (ref 1.7–7.7)
Neutrophils Relative %: 76 %
Platelets: 141 10*3/uL — ABNORMAL LOW (ref 150–400)
RBC: 2.91 MIL/uL — ABNORMAL LOW (ref 4.22–5.81)
RDW: 16.9 % — ABNORMAL HIGH (ref 11.5–15.5)
WBC: 4.4 10*3/uL (ref 4.0–10.5)
nRBC: 0 % (ref 0.0–0.2)

## 2019-08-01 LAB — COMPREHENSIVE METABOLIC PANEL
ALT: 13 U/L (ref 0–44)
AST: 13 U/L — ABNORMAL LOW (ref 15–41)
Albumin: 3.5 g/dL (ref 3.5–5.0)
Alkaline Phosphatase: 54 U/L (ref 38–126)
Anion gap: 10 (ref 5–15)
BUN: 23 mg/dL (ref 8–23)
CO2: 25 mmol/L (ref 22–32)
Calcium: 9.1 mg/dL (ref 8.9–10.3)
Chloride: 105 mmol/L (ref 98–111)
Creatinine, Ser: 1.67 mg/dL — ABNORMAL HIGH (ref 0.61–1.24)
GFR calc Af Amer: 50 mL/min — ABNORMAL LOW (ref >60)
GFR calc non Af Amer: 43 mL/min — ABNORMAL LOW (ref >60)
Glucose, Bld: 88 mg/dL (ref 70–99)
Potassium: 3.5 mmol/L (ref 3.5–5.1)
Sodium: 140 mmol/L (ref 135–145)
Total Bilirubin: 0.7 mg/dL (ref 0.3–1.2)
Total Protein: 5.9 g/dL — ABNORMAL LOW (ref 6.5–8.1)

## 2019-08-01 LAB — MAGNESIUM: Magnesium: 2 mg/dL (ref 1.7–2.4)

## 2019-08-02 ENCOUNTER — Inpatient Hospital Stay (HOSPITAL_COMMUNITY): Payer: 59

## 2019-08-02 ENCOUNTER — Inpatient Hospital Stay (HOSPITAL_BASED_OUTPATIENT_CLINIC_OR_DEPARTMENT_OTHER): Payer: 59 | Admitting: Nurse Practitioner

## 2019-08-02 ENCOUNTER — Other Ambulatory Visit: Payer: Self-pay

## 2019-08-02 ENCOUNTER — Ambulatory Visit
Admission: RE | Admit: 2019-08-02 | Discharge: 2019-08-02 | Disposition: A | Discharge status: Home / Self Care | Payer: 59 | Source: Ambulatory Visit | Attending: Radiation Oncology | Admitting: Radiation Oncology

## 2019-08-02 DIAGNOSIS — C187 Malignant neoplasm of sigmoid colon: Secondary | ICD-10-CM | POA: Diagnosis not present

## 2019-08-02 DIAGNOSIS — Z510 Encounter for antineoplastic radiation therapy: Secondary | ICD-10-CM | POA: Diagnosis not present

## 2019-08-02 MED ORDER — SODIUM CHLORIDE 0.9 % IV SOLN: INTRAVENOUS | Status: AC

## 2019-08-02 MED ORDER — HEPARIN SOD (PORK) LOCK FLUSH 100 UNIT/ML IV SOLN
500.0000 [IU] | Freq: Once | INTRAVENOUS | Status: AC
  Administered 2019-08-02: 500 [IU] via INTRAVENOUS

## 2019-08-02 MED ORDER — SODIUM CHLORIDE 0.9% FLUSH
10.0000 mL | INTRAVENOUS | Status: DC | PRN
  Administered 2019-08-02: 10 mL via INTRAVENOUS

## 2019-08-02 NOTE — Assessment & Plan Note (Addendum)
1.  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 -Laparoscopic partial hepatectomy, redo low anterior resection on 02/01/2019, pathology showing YPT3Y PNO.  Positive circumferential/radial/mesenteric margins. -6 more cycles of FOLFOX completed on 05/31/2019. -Xeloda with radiation started on 06/26/2019.  He is tolerating very well. -Continue Xeloda 2 tablets twice a day, Monday through Friday. -Labs done on 08/01/2019 showed creatinine 1.67.  Hemoglobin 8.9 -He is is not drinking the recommended daily water intake. -We will give him 1 L of fluids today. -His last radiation treatment is Thursday of this week.` -We will reevaluate him in 1 week with fluids.  2.  Diarrhea: -Continue Lomotil 2 tablets 3 times a day.  3.  Hypomagnesemia: -Continue magnesium twice daily. -Labs done on 08/01/2019 showed magnesium 2.9  4.  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 -Neurology referral made, appointment in July.  5.  Gout: -Continue Uloric 40 mg daily.

## 2019-08-02 NOTE — Progress Notes (Signed)
[id number] Labs reviewed with and pt seen by [facility] NP and pt to receive 1 liter of NS over 1-2 hours today per NP                       Troy Smith tolerated IV hydration well without complaints or incident. VSS upon discharge. Pt discharged self ambulatory in satisfactory condition

## 2019-08-02 NOTE — Patient Instructions (Signed)
[place] at [facility] Discharge Instructions  Received IV hydration today. Follow-up as scheduled   Thank you for choosing [place] at [facility] to provide your oncology and hematology care.  To afford each patient quality time with our provider, please arrive at least 15 minutes before your scheduled appointment time.   If you have a lab appointment with the [facility] please come in thru the Main Entrance and check in at the main information desk.  You need to re-schedule your appointment should you arrive 10 or more minutes late.  We strive to give you quality time with our providers, and arriving late affects you and other patients whose appointments are after yours.  Also, if you no show three or more times for appointments you may be dismissed from the clinic at the providers discretion.     Again, thank you for choosing [facility].  Our hope is that these requests will decrease the amount of time that you wait before being seen by our physicians.       _____________________________________________________________  Should you have questions after your visit to [facility], please contact our office at [phone] between the hours of 8:00 a.m. and 4:30 p.m.  Voicemails left after 4:00 p.m. will not be returned until the following business day.  For prescription refill requests, have your pharmacy contact our office and allow 72 hours.    Due to Covid, you will need to wear a mask upon entering the hospital. If you do not have a mask, a mask will be given to you at the Main Entrance upon arrival. For doctor visits, patients may have 1 support person with them. For treatment visits, patients can not have anyone with them due to social distancing guidelines and our immunocompromised population.

## 2019-08-02 NOTE — Patient Instructions (Signed)
[place] at [facility] Discharge Instructions  Follow up in 1 week with labs and fluids   Thank you for choosing [place] at [facility] to provide your oncology and hematology care.  To afford each patient quality time with our provider, please arrive at least 15 minutes before your scheduled appointment time.   If you have a lab appointment with the [facility] please come in thru the Main Entrance and check in at the main information desk.  You need to re-schedule your appointment should you arrive 10 or more minutes late.  We strive to give you quality time with our providers, and arriving late affects you and other patients whose appointments are after yours.  Also, if you no show three or more times for appointments you may be dismissed from the clinic at the providers discretion.     Again, thank you for choosing [facility].  Our hope is that these requests will decrease the amount of time that you wait before being seen by our physicians.       _____________________________________________________________  Should you have questions after your visit to [facility], please contact our office at [phone] between the hours of 8:00 a.m. and 4:30 p.m.  Voicemails left after 4:00 p.m. will not be returned until the following business day.  For prescription refill requests, have your pharmacy contact our office and allow 72 hours.    Due to Covid, you will need to wear a mask upon entering the hospital. If you do not have a mask, a mask will be given to you at the Main Entrance upon arrival. For doctor visits, patients may have 1 support person with them. For treatment visits, patients can not have anyone with them due to social distancing guidelines and our immunocompromised population.

## 2019-08-02 NOTE — Progress Notes (Signed)
Troy Smith, [place] [address]   CLINIC:  Medical Oncology/Hematology  PCP:  Troy Smith, [place] [address] [phone]   REASON FOR VISIT: Follow-up for colon Smith   CURRENT THERAPY: Xeloda with radiation  BRIEF ONCOLOGIC HISTORY:  Oncology History  Adenocarcinoma of sigmoid colon ([facility])  11/20/2015 Imaging   CT abd/pelvis- Focal sigmoid diverticulitis is noted without abscess formation. Focal narrowing and wall thickening of the sigmoid colon is noted in this area most likely due to inflammation, but neoplasm cannot be excluded, and sigmoidoscopy is recommended for further evaluation.   03/20/2016 Procedure   Colonoscopy by Dr. Laural Smith- Malignant partially obstructing tumor in the distal sigmoid colon. Biopsied. - Incomplete exam.   03/23/2016 Pathology Results   Colon, biopsy, sigmoid mass - ADENOCARCINOMA.   03/23/2016 Procedure   Low anterior resection with transanal anastomosis   03/26/2016 Pathology Results   Colon, segmental resection for tumor, sigmoid - INVASIVE WELL DIFFERENTIATED ADENOCARCINOMA, SPANNING 5 CM IN GREATEST DIMENSION. - TUMOR INVADES THROUGH MUSCULARIS PROPRIA TO INVOLVE SUBSEROSAL SOFT TISSUES. - UNDERLYING MARKED ACUTE AND CHRONIC INFLAMMATION WITH GIANT CELL REACTION AND ABSCESS FORMATION EXTENDING TO THE SEROSAL SURFACE AND INVOLVING THE MESENTERIC TISSUE. - MARGINS ARE NEGATIVE FOR TUMOR. - FOURTEEN BENIGN LYMPH NODES WHICH DEMONSTRATE VARIABLE INVOLVEMENT WITH ACUTE AND CHRONIC INFLAMMATION WITH ASSOCIATED GIANT CELLS WITHOUT TUMOR IDENTIFIED (0/14).   04/08/2016 Smith Staging   Smith Staging Adenocarcinoma of sigmoid colon [facility]) Staging form: Colon and Rectum, AJCC 8th Edition - Pathologic stage from 04/01/2016: Stage IIA (pT3, pN0, cM0) - Signed by Troy Cancer, PA-C on 04/01/2016    07/09/2016 Imaging   CT abd/pelvis- No evidence of metastatic disease or other  acute findings.  Bilateral nonobstructing renal calculi and mild bilateral renal parenchymal scarring.  Stable mildly enlarged prostate gland and findings of chronic bladder outlet obstruction.   08/16/2018 -  Chemotherapy   The patient had palonosetron (ALOXI) injection 0.25 mg, 0.25 mg, Intravenous,  Once, 12 of 12 cycles Administration: 0.25 mg (08/16/2018), 0.25 mg (09/07/2018), 0.25 mg (10/19/2018), 0.25 mg (11/02/2018), 0.25 mg (11/16/2018), 0.25 mg (11/30/2018), 0.25 mg (03/21/2019), 0.25 mg (04/04/2019), 0.25 mg (04/18/2019), 0.25 mg (05/02/2019), 0.25 mg (05/17/2019), 0.25 mg (05/31/2019) pegfilgrastim (NEULASTA) injection 6 mg, 6 mg, Subcutaneous, Once, 12 of 12 cycles Administration: 6 mg (08/18/2018), 6 mg (09/09/2018), 6 mg (10/21/2018), 6 mg (11/04/2018), 6 mg (11/18/2018), 6 mg (12/02/2018), 6 mg (03/23/2019), 6 mg (04/06/2019), 6 mg (04/20/2019), 6 mg (05/04/2019), 6 mg (05/19/2019), 6 mg (06/02/2019) irinotecan (CAMPTOSAR) 380 mg in sodium chloride 0.9 % 500 mL chemo infusion, 165 mg/m2 = 380 mg, Intravenous,  Once, 1 of 1 cycle Administration: 380 mg (08/16/2018) leucovorin 900 mg in dextrose 5 % 250 mL infusion, 400 mg/m2 = 900 mg, Intravenous,  Once, 12 of 12 cycles Administration: 900 mg (08/16/2018), 900 mg (09/07/2018), 900 mg (10/19/2018), 900 mg (11/02/2018), 900 mg (11/16/2018), 800 mg (11/30/2018), 900 mg (03/21/2019), 900 mg (04/04/2019), 900 mg (04/18/2019), 900 mg (05/02/2019), 900 mg (05/17/2019), 800 mg (05/31/2019) oxaliplatin (ELOXATIN) 190 mg in dextrose 5 % 500 mL chemo infusion, 85 mg/m2 = 190 mg, Intravenous,  Once, 12 of 12 cycles Dose modification: 68 mg/m2 (80 % of original dose 85 mg/m2, Cycle 2, Reason: Provider Judgment), 68 mg/m2 (80 % of original dose 85 mg/m2, Cycle 3, Reason: Provider Judgment) Administration: 190 mg (08/16/2018), 150 mg (09/07/2018), 150 mg (10/19/2018), 150 mg (11/16/2018), 150 mg (11/02/2018), 135 mg (11/30/2018), 150 mg (  03/21/2019), 150 mg (04/04/2019), 150 mg (04/18/2019), 150 mg  (05/02/2019), 150 mg (05/17/2019), 130 mg (05/31/2019) fluorouracil (ADRUCIL) chemo injection 700 mg, 320 mg/m2 = 700 mg (100 % of original dose 320 mg/m2), Intravenous,  Once, 9 of 9 cycles Dose modification: 320 mg/m2 (original dose 320 mg/m2, Cycle 4) Administration: 700 mg (11/02/2018), 700 mg (11/16/2018), 650 mg (11/30/2018), 700 mg (03/21/2019), 700 mg (04/04/2019), 700 mg (04/18/2019), 700 mg (05/02/2019), 700 mg (05/17/2019), 700 mg (05/31/2019) fosaprepitant (EMEND) 150 mg, dexamethasone (DECADRON) 12 mg in sodium chloride 0.9 % 145 mL IVPB, , Intravenous,  Once, 12 of 12 cycles Administration:  (08/16/2018),  (09/07/2018),  (10/19/2018),  (11/02/2018),  (11/16/2018),  (11/30/2018),  (03/21/2019),  (04/04/2019),  (04/18/2019),  (05/02/2019),  (05/17/2019),  (05/31/2019) fluorouracil (ADRUCIL) 5,400 mg in sodium chloride 0.9 % 142 mL chemo infusion, 2,400 mg/m2 = 5,400 mg, Intravenous, 1 Day/Dose, 12 of 12 cycles Dose modification: 1,920 mg/m2 (80 % of original dose 2,400 mg/m2, Cycle 3, Reason: Provider Judgment) Administration: 5,400 mg (08/16/2018), 5,400 mg (09/07/2018), 4,300 mg (10/19/2018), 4,300 mg (11/02/2018), 4,300 mg (11/16/2018), 3,850 mg (11/30/2018), 4,300 mg (03/21/2019), 4,300 mg (04/04/2019), 4,300 mg (04/18/2019), 4,300 mg (05/02/2019), 4,300 mg (05/17/2019), 3,850 mg (05/31/2019) bevacizumab-awwb (MVASI) 500 mg in sodium chloride 0.9 % 100 mL chemo infusion, 5 mg/kg = 500 mg (100 % of original dose 5 mg/kg), Intravenous,  Once, 6 of 6 cycles Dose modification: 5 mg/kg (original dose 5 mg/kg, Cycle 1), 5 mg/kg (original dose 5 mg/kg, Cycle 2) Administration: 500 mg (08/16/2018), 500 mg (09/07/2018), 500 mg (10/19/2018), 500 mg (11/02/2018), 450 mg (11/16/2018), 400 mg (11/30/2018)  for chemotherapy treatment.      Smith STAGING: Smith Staging Adenocarcinoma of sigmoid colon [place] Smith) Staging form: Colon and Rectum, AJCC 8th Edition - Pathologic stage from 04/01/2016: Stage IIA (pT3, pN0, cM0) - Signed by Troy Cancer,  PA-C on 04/01/2016    INTERVAL HISTORY:  [name] 64 y.o. male returns for routine follow-up for colon Smith.  Patient reports he is not drinking 2 to 3 L of water daily.  He denies any new pains.  He does have a red place on the outer part of his left foot where his shoe rubs.  It is slightly red with no pain.  He is still having diarrhea the Imodium we give him helps. Denies any nausea or vomiting. Denies any new pains. Had not noticed any recent bleeding such as epistaxis, hematuria or hematochezia. Denies recent chest pain on exertion, shortness of breath on minimal exertion, pre-syncopal episodes, or palpitations. Denies any numbness or tingling in hands or feet. Denies any recent fevers, infections, or recent hospitalizations. Patient reports appetite at 75% and energy level at 75%.  He is eating well maintain his weight at this time.     REVIEW OF SYSTEMS:  Review of Systems  Gastrointestinal: Positive for diarrhea.  All other systems reviewed and are negative.    PAST MEDICAL/SURGICAL HISTORY:  Past Medical History:  Diagnosis Date  . Adenocarcinoma of sigmoid colon ([place]) 03/23/2016  . Carpal tunnel syndrome 05/02/2014   Bilateral  . Cervical disc disorder with radiculopathy of cervical region 05/02/2014   Left C8  . Diabetes mellitus without complication ([place])   . Hypertension   . Port-A-Cath in place 08/10/2018   Past Surgical History:  Procedure Laterality Date  . BIOPSY  03/20/2016   Procedure: BIOPSY;  Surgeon: Rogene Houston, MD;  Location: [facility];  Service: Endoscopy;;  colon  . BOWEL RESECTION N/A  03/23/2016   Procedure: LOW ANTERIOR RESECTION;  Surgeon: Aviva Signs, MD;  Location: [facility] OR[facility];  Service: General;  Laterality: N/A;  . COLON SURGERY  01/2019   Done at [facility]  . COLONOSCOPY N/A 03/20/2016   Procedure: COLONOSCOPY;  Surgeon: Rogene Houston, MD;  Location: [facility];  Service: Endoscopy;  Laterality: N/A;  730  . COLONOSCOPY N/A 07/10/2016    Procedure: COLONOSCOPY;  Surgeon: Rogene Houston, MD;  Location: [facility];  Service: Endoscopy;  Laterality: N/A;  1040  . NO PAST SURGERIES    . POLYPECTOMY  07/10/2016   Procedure: POLYPECTOMY;  Surgeon: Rogene Houston, MD;  Location: [facility];  Service: Endoscopy;;  colon  . PORTACATH PLACEMENT Left 08/05/2018   Procedure: INSERTION PORT-A-CATH;  Surgeon: Aviva Signs, MD;  Location: [facility];  Service: General;  Laterality: Left;  . TONSILLECTOMY       SOCIAL HISTORY:  Social History   Socioeconomic History  . Marital status: Married    Spouse name: Not on file  . Number of children: Not on file  . Years of education: Not on file  . Highest education level: Not on file  Occupational History  . Not on file  Tobacco Use  . Smoking status: Never Smoker  . Smokeless tobacco: Never Used  Vaping Use  . Vaping Use: Never used  Substance and Sexual Activity  . Alcohol use: Yes    Comment: rarely  . Drug use: No  . Sexual activity: Not on file  Other Topics Concern  . Not on file  Social History Narrative  . Not on file   Social Determinants of Health   Financial Resource Strain:   . Difficulty of Paying Living Expenses:   Food Insecurity:   . Worried About Charity fundraiser in the Last Year:   . Arboriculturist in the Last Year:   Transportation Needs:   . Film/video editor (Medical):   Marland Kitchen Lack of Transportation (Non-Medical):   Physical Activity:   . Days of Exercise per Week:   . Minutes of Exercise per Session:   Stress:   . Feeling of Stress :   Social Connections:   . Frequency of Communication with Friends and Family:   . Frequency of Social Gatherings with Friends and Family:   . Attends Religious Services:   . Active Member of Clubs or Organizations:   . Attends Archivist Meetings:   Marland Kitchen Marital Status:   Intimate Partner Violence:   . Fear of Current or Ex-Partner:   . Emotionally Abused:   Marland Kitchen Physically Abused:   . Sexually  Abused:     FAMILY HISTORY:  Family History  Problem Relation Age of Onset  . COPD Mother   . Melanoma Father     CURRENT MEDICATIONS:  Outpatient Encounter Medications as of 08/02/2019  Medication Sig  . allopurinol (ZYLOPRIM) 300 MG tablet TAKE ONE TABLET BY MOUTH ONCE DAILY.  Marland Kitchen amLODipine (NORVASC) 10 MG tablet Take 10 mg by mouth daily.  . Bevacizumab (AVASTIN IV) Inject into the vein every 14 (fourteen) days.  . capecitabine (XELODA) 500 MG tablet Take 3 tablets (1,500 mg total) by mouth 2 (two) times daily after a meal. Take Monday through Friday. Take only on days of radiation. (Patient taking differently: Take 500 mg by mouth 2 (two) times daily after a meal. Take Monday through Friday. Take only on days of radiation.)  . dextrose 5 %  SOLN 1,000 mL with fluorouracil 5 GM/100ML SOLN Inject into the vein over 48 hr.  . donepezil (ARICEPT) 10 MG tablet TAKE (1) TABLET BY MOUTH AT BEDTIME.  . febuxostat (ULORIC) 40 MG tablet Take 1 tablet (40 mg total) by mouth daily.  Marland Kitchen FLUOROURACIL IV Inject into the vein every 14 (fourteen) days.  . insulin glargine (LANTUS) 100 UNIT/ML injection Inject 30 Units into the skin at bedtime.   . IRINOTECAN HCL IV Inject into the vein every 14 (fourteen) days.  Marland Kitchen LEUCOVORIN CALCIUM IV Inject into the vein every 14 (fourteen) days.  . magnesium oxide (MAG-OX) 400 (241.3 Mg) MG tablet Take 1 tablet (400 mg total) by mouth 2 (two) times daily.  . metoprolol succinate (TOPROL-XL) 50 MG 24 hr tablet Take 50 mg by mouth daily. Take with or immediately following a meal.  . MITIGARE 0.6 MG CAPS Take 1 capsule by mouth as needed.   . OXALIPLATIN IV Inject into the vein every 14 (fourteen) days.  Marland Kitchen scopolamine (TRANSDERM-SCOP) 1 MG/3DAYS Place 1 patch (1.5 mg total) onto the skin every 3 (three) days.  . diphenoxylate-atropine (LOMOTIL) 2.5-0.025 MG tablet Take 1 tablet by mouth 4 (four) times daily as needed for diarrhea or loose stools. Take 2 tablets at the  first onset of diarrhea followed by 1 tablet after every watery bowel movement. (Patient not taking: Reported on 08/02/2019)  . lidocaine-prilocaine (EMLA) cream Apply a small amount to port a cath site and cover with plastic wrap 1 hour prior to chemotherapy appointments (Patient not taking: Reported on 08/02/2019)  . loperamide (IMODIUM A-D) 2 MG tablet Take 2 at onset of diarrhea, then 1 after each watery bowel movement. (Patient not taking: Reported on 08/02/2019)  . prochlorperazine (COMPAZINE) 10 MG tablet Take 1 tablet (10 mg total) by mouth every 6 (six) hours as needed (Nausea or vomiting). (Patient not taking: Reported on 08/02/2019)   No facility-administered encounter medications on file as of 08/02/2019.    ALLERGIES:  Allergies  Allergen Reactions  . Penicillin G Nausea And Vomiting     PHYSICAL EXAM:  ECOG Performance status: 1  Vitals:   08/02/19 0818  BP: 126/70  Pulse: (!) 110  Resp: 18  Temp: 98.5 F (36.9 C)  SpO2: 100%   Filed Weights   08/02/19 0818  Weight: 185 lb 14.4 oz (84.3 kg)   Physical Exam Constitutional:      Appearance: Normal appearance. He is normal weight.  Cardiovascular:     Rate and Rhythm: Regular rhythm.     Heart sounds: Normal heart sounds.  Pulmonary:     Effort: Pulmonary effort is normal.     Breath sounds: Normal breath sounds.  Abdominal:     General: Bowel sounds are normal.     Palpations: Abdomen is soft.  Musculoskeletal:        General: Normal range of motion.  Skin:    General: Skin is warm.  Neurological:     Mental Status: He is alert and oriented to person, place, and time. Mental status is at baseline.  Psychiatric:        Mood and Affect: Mood normal.        Behavior: Behavior normal.        Thought Content: Thought content normal.        Judgment: Judgment normal.      LABORATORY DATA:  I have reviewed the labs as listed.  CBC    Component Value Date/Time   WBC 4.4  08/01/2019 1119   RBC 2.91 (L)  08/01/2019 1119   HGB 8.9 (L) 08/01/2019 1119   HCT 28.2 (L) 08/01/2019 1119   PLT 141 (L) 08/01/2019 1119   MCV 96.9 08/01/2019 1119   MCH 30.6 08/01/2019 1119   MCHC 31.6 08/01/2019 1119   RDW 16.9 (H) 08/01/2019 1119   LYMPHSABS 0.4 (L) 08/01/2019 1119   MONOABS 0.3 08/01/2019 1119   EOSABS 0.3 08/01/2019 1119   BASOSABS 0.0 08/01/2019 1119   CMP Latest Ref Rng & Units 08/01/2019 07/24/2019 07/18/2019  Glucose 70 - 99 mg/dL 88 124(H) 113(H)  BUN 8 - 23 mg/dL 23 17 21   Creatinine 0.61 - 1.24 mg/dL 1.67(H) 1.59(H) 1.41(H)  Sodium 135 - 145 mmol/L 140 139 141  Potassium 3.5 - 5.1 mmol/L 3.5 3.6 4.5  Chloride 98 - 111 mmol/L 105 105 105  CO2 22 - 32 mmol/L 25 26 26   Calcium 8.9 - 10.3 mg/dL 9.1 9.5 9.6  Total Protein 6.5 - 8.1 g/dL 5.9(L) 6.2(L) 6.4(L)  Total Bilirubin 0.3 - 1.2 mg/dL 0.7 1.2 0.7  Alkaline Phos 38 - 126 U/L 54 53 60  AST 15 - 41 U/L 13(L) 13(L) 12(L)  ALT 0 - 44 U/L 13 10 10     All questions were answered to patient's stated satisfaction. Encouraged patient to call with any new concerns or questions before his next visit to the [facility] and we can certain see him sooner, if needed.     ASSESSMENT & PLAN:  Adenocarcinoma of sigmoid colon ([place]) 1.  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 -Laparoscopic partial hepatectomy, redo low anterior resection on 02/01/2019, pathology showing YPT3Y PNO.  Positive circumferential/radial/mesenteric margins. -6 more cycles of FOLFOX completed on 05/31/2019. -Xeloda with radiation started on 06/26/2019.  He is tolerating very well. -Continue Xeloda 2 tablets twice a day, Monday through Friday. -Labs done on 08/01/2019 showed creatinine 1.67.  Hemoglobin 8.9 -He is is not drinking the recommended daily water intake. -We will give him 1 L of fluids  today. -His last radiation treatment is Thursday of this week.` -We will reevaluate him in 1 week with fluids.  2.  Diarrhea: -Continue Lomotil 2 tablets 3 times a day.  3.  Hypomagnesemia: -Continue magnesium twice daily. -Labs done on 08/01/2019 showed magnesium 2.9  4.  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 -Neurology referral made, appointment in July.  5.  Gout: -Continue Uloric 40 mg daily.     Orders placed this encounter:  Orders Placed This Encounter  Procedures  . Lactate dehydrogenase  . CBC with Differential/Platelet  . Comprehensive metabolic panel  . Ferritin  . Iron and TIBC  . Vitamin B12  . VITAMIN D 25 Hydroxy (Vit-D Deficiency, Fractures)  . Folate     Francene Finders, FNP-C [place] [phone]

## 2019-08-03 ENCOUNTER — Ambulatory Visit
Admission: RE | Admit: 2019-08-03 | Discharge: 2019-08-03 | Disposition: A | Discharge status: Home / Self Care | Payer: 59 | Source: Ambulatory Visit | Attending: Radiation Oncology | Admitting: Radiation Oncology

## 2019-08-03 ENCOUNTER — Encounter: Payer: Self-pay | Admitting: Radiation Oncology

## 2019-08-03 ENCOUNTER — Other Ambulatory Visit: Payer: Self-pay

## 2019-08-03 DIAGNOSIS — Z510 Encounter for antineoplastic radiation therapy: Secondary | ICD-10-CM | POA: Diagnosis not present

## 2019-08-08 ENCOUNTER — Other Ambulatory Visit (HOSPITAL_COMMUNITY): Payer: Self-pay | Admitting: *Deleted

## 2019-08-08 DIAGNOSIS — C187 Malignant neoplasm of sigmoid colon: Secondary | ICD-10-CM

## 2019-08-10 ENCOUNTER — Inpatient Hospital Stay (HOSPITAL_COMMUNITY): Payer: 59 | Attending: Hematology

## 2019-08-10 ENCOUNTER — Other Ambulatory Visit: Payer: Self-pay

## 2019-08-10 DIAGNOSIS — R197 Diarrhea, unspecified: Secondary | ICD-10-CM | POA: Insufficient documentation

## 2019-08-10 DIAGNOSIS — M109 Gout, unspecified: Secondary | ICD-10-CM | POA: Insufficient documentation

## 2019-08-10 DIAGNOSIS — Z794 Long term (current) use of insulin: Secondary | ICD-10-CM | POA: Insufficient documentation

## 2019-08-10 DIAGNOSIS — Z9221 Personal history of antineoplastic chemotherapy: Secondary | ICD-10-CM | POA: Diagnosis not present

## 2019-08-10 DIAGNOSIS — R413 Other amnesia: Secondary | ICD-10-CM | POA: Insufficient documentation

## 2019-08-10 DIAGNOSIS — C187 Malignant neoplasm of sigmoid colon: Secondary | ICD-10-CM | POA: Diagnosis present

## 2019-08-10 DIAGNOSIS — I10 Essential (primary) hypertension: Secondary | ICD-10-CM | POA: Insufficient documentation

## 2019-08-10 DIAGNOSIS — E119 Type 2 diabetes mellitus without complications: Secondary | ICD-10-CM | POA: Insufficient documentation

## 2019-08-10 DIAGNOSIS — C787 Secondary malignant neoplasm of liver and intrahepatic bile duct: Secondary | ICD-10-CM | POA: Insufficient documentation

## 2019-08-10 DIAGNOSIS — Z79899 Other long term (current) drug therapy: Secondary | ICD-10-CM | POA: Insufficient documentation

## 2019-08-10 LAB — CBC WITH DIFFERENTIAL/PLATELET
Abs Immature Granulocytes: 0.01 10*3/uL (ref 0.00–0.07)
Basophils Absolute: 0 10*3/uL (ref 0.0–0.1)
Basophils Relative: 0 %
Eosinophils Absolute: 0.2 10*3/uL (ref 0.0–0.5)
Eosinophils Relative: 5 %
HCT: 29.4 % — ABNORMAL LOW (ref 39.0–52.0)
Hemoglobin: 9.3 g/dL — ABNORMAL LOW (ref 13.0–17.0)
Immature Granulocytes: 0 %
Lymphocytes Relative: 13 %
Lymphs Abs: 0.6 10*3/uL — ABNORMAL LOW (ref 0.7–4.0)
MCH: 31.1 pg (ref 26.0–34.0)
MCHC: 31.6 g/dL (ref 30.0–36.0)
MCV: 98.3 fL (ref 80.0–100.0)
Monocytes Absolute: 0.3 10*3/uL (ref 0.1–1.0)
Monocytes Relative: 7 %
Neutro Abs: 3.2 10*3/uL (ref 1.7–7.7)
Neutrophils Relative %: 75 %
Platelets: 136 10*3/uL — ABNORMAL LOW (ref 150–400)
RBC: 2.99 MIL/uL — ABNORMAL LOW (ref 4.22–5.81)
RDW: 17 % — ABNORMAL HIGH (ref 11.5–15.5)
WBC: 4.3 10*3/uL (ref 4.0–10.5)
nRBC: 0 % (ref 0.0–0.2)

## 2019-08-10 LAB — COMPREHENSIVE METABOLIC PANEL
ALT: 14 U/L (ref 0–44)
AST: 16 U/L (ref 15–41)
Albumin: 3.6 g/dL (ref 3.5–5.0)
Alkaline Phosphatase: 56 U/L (ref 38–126)
Anion gap: 10 (ref 5–15)
BUN: 19 mg/dL (ref 8–23)
CO2: 24 mmol/L (ref 22–32)
Calcium: 9.2 mg/dL (ref 8.9–10.3)
Chloride: 106 mmol/L (ref 98–111)
Creatinine, Ser: 1.52 mg/dL — ABNORMAL HIGH (ref 0.61–1.24)
GFR calc Af Amer: 56 mL/min — ABNORMAL LOW (ref >60)
GFR calc non Af Amer: 48 mL/min — ABNORMAL LOW (ref >60)
Glucose, Bld: 125 mg/dL — ABNORMAL HIGH (ref 70–99)
Potassium: 3.6 mmol/L (ref 3.5–5.1)
Sodium: 140 mmol/L (ref 135–145)
Total Bilirubin: 0.7 mg/dL (ref 0.3–1.2)
Total Protein: 6 g/dL — ABNORMAL LOW (ref 6.5–8.1)

## 2019-08-10 LAB — IRON AND TIBC
Iron: 53 ug/dL (ref 45–182)
Saturation Ratios: 22 % (ref 17.9–39.5)
TIBC: 240 ug/dL — ABNORMAL LOW (ref 250–450)
UIBC: 187 ug/dL

## 2019-08-10 LAB — FOLATE: Folate: 15.1 ng/mL (ref >5.9)

## 2019-08-10 LAB — VITAMIN D 25 HYDROXY (VIT D DEFICIENCY, FRACTURES): Vit D, 25-Hydroxy: 58.82 ng/mL (ref 30–100)

## 2019-08-10 LAB — FERRITIN: Ferritin: 230 ng/mL (ref 24–336)

## 2019-08-10 LAB — VITAMIN B12: Vitamin B-12: 213 pg/mL (ref 180–914)

## 2019-08-10 LAB — LACTATE DEHYDROGENASE: LDH: 134 U/L (ref 98–192)

## 2019-08-11 ENCOUNTER — Inpatient Hospital Stay (HOSPITAL_BASED_OUTPATIENT_CLINIC_OR_DEPARTMENT_OTHER): Payer: 59 | Admitting: Nurse Practitioner

## 2019-08-11 ENCOUNTER — Inpatient Hospital Stay (HOSPITAL_COMMUNITY): Payer: 59

## 2019-08-11 DIAGNOSIS — C187 Malignant neoplasm of sigmoid colon: Secondary | ICD-10-CM | POA: Diagnosis not present

## 2019-08-11 MED ORDER — HEPARIN SOD (PORK) LOCK FLUSH 100 UNIT/ML IV SOLN
500.0000 [IU] | Freq: Once | INTRAVENOUS | Status: AC
  Administered 2019-08-11: 500 [IU] via INTRAVENOUS

## 2019-08-11 MED ORDER — SODIUM CHLORIDE 0.9% FLUSH
10.0000 mL | INTRAVENOUS | Status: DC | PRN
  Administered 2019-08-11: 10 mL via INTRAVENOUS

## 2019-08-11 MED ORDER — SODIUM CHLORIDE 0.9 % IV SOLN: INTRAVENOUS | Status: AC

## 2019-08-11 NOTE — Progress Notes (Signed)
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 Focal narrowing and wall thickening of the sigmoid colon is noted in this area most likely due to inflammation, but neoplasm cannot be excluded, and sigmoidoscopy is recommended for further evaluation.   03/20/2016 Procedure   Colonoscopy by Troy Smith- Malignant partially obstructing tumor in the distal sigmoid colon. Biopsied. - Incomplete exam.   03/23/2016 Pathology Results   Colon, biopsy, sigmoid mass - ADENOCARCINOMA.   03/23/2016 Procedure   Low anterior resection with transanal anastomosis   03/26/2016 Pathology Results   Colon, segmental resection for tumor, sigmoid - INVASIVE WELL DIFFERENTIATED ADENOCARCINOMA, SPANNING 5 CM IN GREATEST DIMENSION. - TUMOR INVADES THROUGH MUSCULARIS PROPRIA TO INVOLVE SUBSEROSAL SOFT TISSUES. - UNDERLYING MARKED ACUTE AND CHRONIC INFLAMMATION WITH GIANT CELL REACTION AND ABSCESS FORMATION EXTENDING TO THE SEROSAL SURFACE AND INVOLVING THE MESENTERIC TISSUE. - MARGINS ARE NEGATIVE FOR TUMOR. - FOURTEEN BENIGN LYMPH NODES WHICH DEMONSTRATE VARIABLE INVOLVEMENT WITH ACUTE AND CHRONIC INFLAMMATION WITH ASSOCIATED GIANT CELLS WITHOUT TUMOR IDENTIFIED (0/14).   04/08/2016 Smith Staging   Smith Staging Adenocarcinoma of sigmoid colon [facility]) Staging form: Colon and Rectum, AJCC 8th Edition - Pathologic stage from 04/01/2016: Stage IIA (pT3, pN0, cM0) - Signed by Troy Cancer, PA-C on 04/01/2016    07/09/2016 Imaging   CT abd/pelvis- No evidence of metastatic disease or other acute  findings.  Bilateral nonobstructing renal calculi and mild bilateral renal parenchymal scarring.  Stable mildly enlarged prostate gland and findings of chronic bladder outlet obstruction.   08/16/2018 -  Chemotherapy   The patient had palonosetron (ALOXI) injection 0.25 mg, 0.25 mg, Intravenous,  Once, 12 of 12 cycles Administration: 0.25 mg (08/16/2018), 0.25 mg (09/07/2018), 0.25 mg (10/19/2018), 0.25 mg (11/02/2018), 0.25 mg (11/16/2018), 0.25 mg (11/30/2018), 0.25 mg (03/21/2019), 0.25 mg (04/04/2019), 0.25 mg (04/18/2019), 0.25 mg (05/02/2019), 0.25 mg (05/17/2019), 0.25 mg (05/31/2019) pegfilgrastim (NEULASTA) injection 6 mg, 6 mg, Subcutaneous, Once, 12 of 12 cycles Administration: 6 mg (08/18/2018), 6 mg (09/09/2018), 6 mg (10/21/2018), 6 mg (11/04/2018), 6 mg (11/18/2018), 6 mg (12/02/2018), 6 mg (03/23/2019), 6 mg (04/06/2019), 6 mg (04/20/2019), 6 mg (05/04/2019), 6 mg (05/19/2019), 6 mg (06/02/2019) irinotecan (CAMPTOSAR) 380 mg in sodium chloride 0.9 % 500 mL chemo infusion, 165 mg/m2 = 380 mg, Intravenous,  Once, 1 of 1 cycle Administration: 380 mg (08/16/2018) leucovorin 900 mg in dextrose 5 % 250 mL infusion, 400 mg/m2 = 900 mg, Intravenous,  Once, 12 of 12 cycles Administration: 900 mg (08/16/2018), 900 mg (09/07/2018), 900 mg (10/19/2018), 900 mg (11/02/2018), 900 mg (11/16/2018), 800 mg (11/30/2018), 900 mg (03/21/2019), 900 mg (04/04/2019), 900 mg (04/18/2019), 900 mg (05/02/2019), 900 mg (05/17/2019), 800 mg (05/31/2019) oxaliplatin (ELOXATIN) 190 mg in dextrose 5 % 500 mL chemo infusion, 85 mg/m2 = 190 mg, Intravenous,  Once, 12 of 12 cycles Dose modification: 68 mg/m2 (80 % of original dose 85 mg/m2, Cycle 2, Reason: Provider Judgment), 68 mg/m2 (80 % of original dose 85 mg/m2, Cycle 3, Reason: Provider Judgment) Administration: 190 mg (08/16/2018), 150 mg (09/07/2018), 150 mg (10/19/2018), 150 mg (11/16/2018), 150 mg (11/02/2018), 135 mg (11/30/2018), 150 mg (03/21/2019), 150  mg (04/04/2019), 150 mg (04/18/2019), 150 mg (05/02/2019),  150 mg (05/17/2019), 130 mg (05/31/2019) fluorouracil (ADRUCIL) chemo injection 700 mg, 320 mg/m2 = 700 mg (100 % of original dose 320 mg/m2), Intravenous,  Once, 9 of 9 cycles Dose modification: 320 mg/m2 (original dose 320 mg/m2, Cycle 4) Administration: 700 mg (11/02/2018), 700 mg (11/16/2018), 650 mg (11/30/2018), 700 mg (03/21/2019), 700 mg (04/04/2019), 700 mg (04/18/2019), 700 mg (05/02/2019), 700 mg (05/17/2019), 700 mg (05/31/2019) fosaprepitant (EMEND) 150 mg, dexamethasone (DECADRON) 12 mg in sodium chloride 0.9 % 145 mL IVPB, , Intravenous,  Once, 12 of 12 cycles Administration:  (08/16/2018),  (09/07/2018),  (10/19/2018),  (11/02/2018),  (11/16/2018),  (11/30/2018),  (03/21/2019),  (04/04/2019),  (04/18/2019),  (05/02/2019),  (05/17/2019),  (05/31/2019) fluorouracil (ADRUCIL) 5,400 mg in sodium chloride 0.9 % 142 mL chemo infusion, 2,400 mg/m2 = 5,400 mg, Intravenous, 1 Day/Dose, 12 of 12 cycles Dose modification: 1,920 mg/m2 (80 % of original dose 2,400 mg/m2, Cycle 3, Reason: Provider Judgment) Administration: 5,400 mg (08/16/2018), 5,400 mg (09/07/2018), 4,300 mg (10/19/2018), 4,300 mg (11/02/2018), 4,300 mg (11/16/2018), 3,850 mg (11/30/2018), 4,300 mg (03/21/2019), 4,300 mg (04/04/2019), 4,300 mg (04/18/2019), 4,300 mg (05/02/2019), 4,300 mg (05/17/2019), 3,850 mg (05/31/2019) bevacizumab-awwb (MVASI) 500 mg in sodium chloride 0.9 % 100 mL chemo infusion, 5 mg/kg = 500 mg (100 % of original dose 5 mg/kg), Intravenous,  Once, 6 of 6 cycles Dose modification: 5 mg/kg (original dose 5 mg/kg, Cycle 1), 5 mg/kg (original dose 5 mg/kg, Cycle 2) Administration: 500 mg (08/16/2018), 500 mg (09/07/2018), 500 mg (10/19/2018), 500 mg (11/02/2018), 450 mg (11/16/2018), 400 mg (11/30/2018)  for chemotherapy treatment.      Smith STAGING: Smith Staging Adenocarcinoma of sigmoid colon [facility]) Staging form: Colon and Rectum, AJCC 8th Edition - Pathologic stage from 04/01/2016: Stage IIA (pT3, pN0, cM0) - Signed by Troy Cancer, PA-C on  04/01/2016    INTERVAL HISTORY:  Troy Smith 64 y.o. male returns for routine follow-up for adenocarcinoma of the sigmoid colon.  Patient report he is doing well since last visit.  He reports he is still not drinking enough fluids during the day.  He reports he is still having occasional diarrhea. Denies any nausea and vomiting. Denies any new pains. Had not noticed any recent bleeding such as epistaxis, hematuria or hematochezia. Denies recent chest pain on exertion, shortness of breath on minimal exertion, pre-syncopal episodes, or palpitations. Denies any numbness or tingling in hands or feet. Denies any recent fevers, infections, or recent hospitalizations. Patient reports appetite at 75% and energy level at 75%.  He has maintained his weight at this time.    REVIEW OF SYSTEMS:  Review of Systems  Constitutional: Positive for fatigue.  Gastrointestinal: Positive for diarrhea.  Neurological: Positive for numbness.  All other systems reviewed and are negative.    PAST MEDICAL/SURGICAL HISTORY:  Past Medical History:  Diagnosis Date  . Adenocarcinoma of sigmoid colon ([place]) 03/23/2016  . Carpal tunnel syndrome 05/02/2014   Bilateral  . Cervical disc disorder with radiculopathy of cervical region 05/02/2014   Left C8  . Diabetes mellitus without complication ([place])   . Hypertension   . Port-A-Cath in place 08/10/2018   Past Surgical History:  Procedure Laterality Date  . BIOPSY  03/20/2016   Procedure: BIOPSY;  Surgeon: Rogene Houston, MD;  Location[facility];  Service: Endoscopy;;  colon  . BOWEL RESECTION N/A 03/23/2016   Procedure: LOW ANTERIOR RESECTION;  Surgeon: Aviva Signs, MD;  Location: [facility] ORS;  Service: General;  Laterality: N/A;  . COLON SURGERY  01/2019   Done at [facility]  . COLONOSCOPY N/A 03/20/2016   Procedure: COLONOSCOPY;  Surgeon: Rogene Houston, MD;  Location: [facility];  Service: Endoscopy;  Laterality: N/A;  730  . COLONOSCOPY N/A 07/10/2016   Procedure:  COLONOSCOPY;  Surgeon: Rogene Houston, MD;  Location: [facility];  Service: Endoscopy;  Laterality: N/A;  1040  . NO PAST SURGERIES    . POLYPECTOMY  07/10/2016   Procedure: POLYPECTOMY;  Surgeon: Rogene Houston, MD;  Location: [facility];  Service: Endoscopy;;  colon  . PORTACATH PLACEMENT Left 08/05/2018   Procedure: INSERTION PORT-A-CATH;  Surgeon: Aviva Signs, MD;  Location: [facility] ORS;  Service: General;  Laterality: Left;  . TONSILLECTOMY       SOCIAL HISTORY:  Social History   Socioeconomic History  . Marital status: Married    Spouse name: Not on file  . Number of children: Not on file  . Years of education: Not on file  . Highest education level: Not on file  Occupational History  . Not on file  Tobacco Use  . Smoking status: Never Smoker  . Smokeless tobacco: Never Used  Vaping Use  . Vaping Use: Never used  Substance and Sexual Activity  . Alcohol use: Yes    Comment: rarely  . Drug use: No  . Sexual activity: Not on file  Other Topics Concern  . Not on file  Social History Narrative  . Not on file   Social Determinants of Health   Financial Resource Strain:   . Difficulty of Paying Living Expenses:   Food Insecurity:   . Worried About Charity fundraiser in the Last Year:   . Arboriculturist in the Last Year:   Transportation Needs:   . Film/video editor (Medical):   Marland Kitchen Lack of Transportation (Non-Medical):   Physical Activity:   . Days of Exercise per Week:   . Minutes of Exercise per Session:   Stress:   . Feeling of Stress :   Social Connections:   . Frequency of Communication with Friends and Family:   . Frequency of Social Gatherings with Friends and Family:   . Attends Religious Services:   . Active Member of Clubs or Organizations:   . Attends Archivist Meetings:   Marland Kitchen Marital Status:   Intimate Partner Violence:   . Fear of Current or Ex-Partner:   . Emotionally Abused:   Marland Kitchen Physically Abused:   . Sexually Abused:      FAMILY HISTORY:  Family History  Problem Relation Age of Onset  . COPD Mother   . Melanoma Father     CURRENT MEDICATIONS:  Outpatient Encounter Medications as of 08/11/2019  Medication Sig  . allopurinol (ZYLOPRIM) 300 MG tablet TAKE ONE TABLET BY MOUTH ONCE DAILY.  Marland Kitchen amLODipine (NORVASC) 10 MG tablet Take 10 mg by mouth daily.  . Bevacizumab (AVASTIN IV) Inject into the vein every 14 (fourteen) days.  Marland Kitchen dextrose 5 % SOLN 1,000 mL with fluorouracil 5 GM/100ML SOLN Inject into the vein over 48 hr.  . donepezil (ARICEPT) 10 MG tablet TAKE (1) TABLET BY MOUTH AT BEDTIME.  . febuxostat (ULORIC) 40 MG tablet Take 1 tablet (40 mg total) by mouth daily.  Marland Kitchen FLUOROURACIL IV Inject into the vein every 14 (fourteen) days.  . insulin glargine (LANTUS) 100 UNIT/ML injection Inject 30 Units into the skin at bedtime.   . IRINOTECAN  HCL IV Inject into the vein every 14 (fourteen) days.  Marland Kitchen LEUCOVORIN CALCIUM IV Inject into the vein every 14 (fourteen) days.  . magnesium oxide (MAG-OX) 400 (241.3 Mg) MG tablet Take 1 tablet (400 mg total) by mouth 2 (two) times daily.  . metoprolol succinate (TOPROL-XL) 50 MG 24 hr tablet Take 50 mg by mouth daily. Take with or immediately following a meal.  . MITIGARE 0.6 MG CAPS Take 1 capsule by mouth as needed.   . OXALIPLATIN IV Inject into the vein every 14 (fourteen) days.  Marland Kitchen scopolamine (TRANSDERM-SCOP) 1 MG/3DAYS Place 1 patch (1.5 mg total) onto the skin every 3 (three) days.  . diphenoxylate-atropine (LOMOTIL) 2.5-0.025 MG tablet Take 1 tablet by mouth 4 (four) times daily as needed for diarrhea or loose stools. Take 2 tablets at the first onset of diarrhea followed by 1 tablet after every watery bowel movement. (Patient not taking: Reported on 08/11/2019)  . lidocaine-prilocaine (EMLA) cream Apply a small amount to port a cath site and cover with plastic wrap 1 hour prior to chemotherapy appointments (Patient not taking: Reported on 08/11/2019)  . loperamide  (IMODIUM A-D) 2 MG tablet Take 2 at onset of diarrhea, then 1 after each watery bowel movement. (Patient not taking: Reported on 08/11/2019)  . prochlorperazine (COMPAZINE) 10 MG tablet Take 1 tablet (10 mg total) by mouth every 6 (six) hours as needed (Nausea or vomiting). (Patient not taking: Reported on 08/11/2019)   No facility-administered encounter medications on file as of 08/11/2019.    ALLERGIES:  Allergies  Allergen Reactions  . Penicillin G Nausea And Vomiting     PHYSICAL EXAM:  ECOG Performance status: 1  Vitals:   08/11/19 0919  BP: 117/73  Pulse: 98  Resp: 18  Temp: 98 F (36.7 C)  SpO2: 100%   Filed Weights   08/11/19 0919  Weight: 185 lb 11.2 oz (84.2 kg)     Physical Exam Constitutional:      Appearance: Normal appearance. He is normal weight.  Cardiovascular:     Rate and Rhythm: Normal rate and regular rhythm.     Heart sounds: Normal heart sounds.  Pulmonary:     Effort: Pulmonary effort is normal.     Breath sounds: Normal breath sounds.  Abdominal:     General: Bowel sounds are normal.     Palpations: Abdomen is soft.  Musculoskeletal:        General: Normal range of motion.  Skin:    General: Skin is warm.  Neurological:     Mental Status: He is alert and oriented to person, place, and time. Mental status is at baseline.  Psychiatric:        Mood and Affect: Mood normal.        Behavior: Behavior normal.        Thought Content: Thought content normal.        Judgment: Judgment normal.      LABORATORY DATA:  I have reviewed the labs as listed.  CBC    Component Value Date/Time   WBC 4.3 08/10/2019 1045   RBC 2.99 (L) 08/10/2019 1045   HGB 9.3 (L) 08/10/2019 1045   HCT 29.4 (L) 08/10/2019 1045   PLT 136 (L) 08/10/2019 1045   MCV 98.3 08/10/2019 1045   MCH 31.1 08/10/2019 1045   MCHC 31.6 08/10/2019 1045   RDW 17.0 (H) 08/10/2019 1045   LYMPHSABS 0.6 (L) 08/10/2019 1045   MONOABS 0.3 08/10/2019 1045   EOSABS 0.2 08/10/2019  1045    BASOSABS 0.0 08/10/2019 1045   CMP Latest Ref Rng & Units 08/10/2019 08/01/2019 07/24/2019  Glucose 70 - 99 mg/dL 125(H) 88 124(H)  BUN 8 - 23 mg/dL 19 23 17   Creatinine 0.61 - 1.24 mg/dL 1.52(H) 1.67(H) 1.59(H)  Sodium 135 - 145 mmol/L 140 140 139  Potassium 3.5 - 5.1 mmol/L 3.6 3.5 3.6  Chloride 98 - 111 mmol/L 106 105 105  CO2 22 - 32 mmol/L 24 25 26   Calcium 8.9 - 10.3 mg/dL 9.2 9.1 9.5  Total Protein 6.5 - 8.1 g/dL 6.0(L) 5.9(L) 6.2(L)  Total Bilirubin 0.3 - 1.2 mg/dL 0.7 0.7 1.2  Alkaline Phos 38 - 126 U/L 56 54 53  AST 15 - 41 U/L 16 13(L) 13(L)  ALT 0 - 44 U/L 14 13 10     All questions were answered to patient's stated satisfaction. Encouraged patient to call with any new concerns or questions before his next visit to the [facility] and we can certain see him sooner, if needed.     ASSESSMENT & PLAN:  Adenocarcinoma of sigmoid colon ([place]) 1.  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 -Laparoscopic partial hepatectomy, redo low anterior resection on 02/01/2019, pathology showing YPT3Y PNO.  Positive circumferential/radial/mesenteric margins. -6 more cycles of FOLFOX completed on 05/31/2019. -Xeloda with radiation started on 06/26/2019.  He is tolerating very well. -He completed Xeloda and radiation therapy on 08/03/2019 -Labs done on 08/10/2019 showed creatinine 1.52, hemoglobin 9.3, WBC 4.3, platelets 136, ferritin 230, percent saturation 22 -He is is not drinking the recommended daily water intake. -We will give him 1 L of fluids today. -We will reevaluate him in 1 week with fluids.  2.  Diarrhea: -Continue Lomotil 2 tablets 3 times a day.  3.  Hypomagnesemia: -Continue magnesium twice daily.  4.  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 -Neurology referral made, appointment in July.  5.   Gout: -Continue Uloric 40 mg daily.     Orders placed this encounter:  Orders Placed This Encounter  Procedures  . Lactate dehydrogenase  . Magnesium  . CBC with Differential/Platelet  . Comprehensive metabolic panel  . Vitamin B12  . VITAMIN D 25 Hydroxy (Vit-D Deficiency, Fractures)      Francene Finders, FNP-C [place] [phone]

## 2019-08-11 NOTE — Patient Instructions (Signed)
[facility] at [facility] Discharge Instructions  Received IV hydration today. Follow-up as scheduled   Thank you for choosing [place] at [facility] to provide your oncology and hematology care.  To afford each patient quality time with our provider, please arrive at least 15 minutes before your scheduled appointment time.   If you have a lab appointment with the [facility] please come in thru the Main Entrance and check in at the main information desk.  You need to re-schedule your appointment should you arrive 10 or more minutes late.  We strive to give you quality time with our providers, and arriving late affects you and other patients whose appointments are after yours.  Also, if you no show three or more times for appointments you may be dismissed from the clinic at the providers discretion.     Again, thank you for choosing [facility].  Our hope is that these requests will decrease the amount of time that you wait before being seen by our physicians.       _____________________________________________________________  Should you have questions after your visit to [facility], please contact our office at [phone] between the hours of 8:00 a.m. and 4:30 p.m.  Voicemails left after 4:00 p.m. will not be returned until the following business day.  For prescription refill requests, have your pharmacy contact our office and allow 72 hours.    Due to Covid, you will need to wear a mask upon entering the hospital. If you do not have a mask, a mask will be given to you at the Main Entrance upon arrival. For doctor visits, patients may have 1 support person with them. For treatment visits, patients can not have anyone with them due to social distancing guidelines and our immunocompromised population.

## 2019-08-11 NOTE — Progress Notes (Signed)
0959 Labs reviewed with and pt seen by RLocakamy NP and pt to receive 1 liter of NS over 1-2 hours today per NP                       Troy Smith tolerated IV hydration well without complaints or incident. Pt discharged self ambulatory in satisfactory ocndition

## 2019-08-11 NOTE — Assessment & Plan Note (Addendum)
1.  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 -Laparoscopic partial hepatectomy, redo low anterior resection on 02/01/2019, pathology showing YPT3Y PNO.  Positive circumferential/radial/mesenteric margins. -6 more cycles of FOLFOX completed on 05/31/2019. -Xeloda with radiation started on 06/26/2019.  He is tolerating very well. -He completed Xeloda and radiation therapy on 08/03/2019 -Labs done on 08/10/2019 showed creatinine 1.52, hemoglobin 9.3, WBC 4.3, platelets 136, ferritin 230, percent saturation 22 -He is is not drinking the recommended daily water intake. -We will give him 1 L of fluids today. -We will reevaluate him in 1 week with fluids.  2.  Diarrhea: -Continue Lomotil 2 tablets 3 times a day.  3.  Hypomagnesemia: -Continue magnesium twice daily.  4.  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 -Neurology referral made, appointment in July.  5.  Gout: -Continue Uloric 40 mg daily.

## 2019-08-17 ENCOUNTER — Other Ambulatory Visit: Payer: Self-pay

## 2019-08-17 ENCOUNTER — Inpatient Hospital Stay (HOSPITAL_COMMUNITY): Payer: 59

## 2019-08-17 DIAGNOSIS — C187 Malignant neoplasm of sigmoid colon: Secondary | ICD-10-CM

## 2019-08-17 LAB — VITAMIN B12: Vitamin B-12: 249 pg/mL (ref 180–914)

## 2019-08-17 LAB — CBC WITH DIFFERENTIAL/PLATELET
Abs Immature Granulocytes: 0.02 10*3/uL (ref 0.00–0.07)
Basophils Absolute: 0 10*3/uL (ref 0.0–0.1)
Basophils Relative: 0 %
Eosinophils Absolute: 0.2 10*3/uL (ref 0.0–0.5)
Eosinophils Relative: 4 %
HCT: 29.2 % — ABNORMAL LOW (ref 39.0–52.0)
Hemoglobin: 9.5 g/dL — ABNORMAL LOW (ref 13.0–17.0)
Immature Granulocytes: 1 %
Lymphocytes Relative: 12 %
Lymphs Abs: 0.5 10*3/uL — ABNORMAL LOW (ref 0.7–4.0)
MCH: 31.8 pg (ref 26.0–34.0)
MCHC: 32.5 g/dL (ref 30.0–36.0)
MCV: 97.7 fL (ref 80.0–100.0)
Monocytes Absolute: 0.3 10*3/uL (ref 0.1–1.0)
Monocytes Relative: 7 %
Neutro Abs: 3.2 10*3/uL (ref 1.7–7.7)
Neutrophils Relative %: 76 %
Platelets: 144 10*3/uL — ABNORMAL LOW (ref 150–400)
RBC: 2.99 MIL/uL — ABNORMAL LOW (ref 4.22–5.81)
RDW: 15.9 % — ABNORMAL HIGH (ref 11.5–15.5)
WBC: 4.2 10*3/uL (ref 4.0–10.5)
nRBC: 0 % (ref 0.0–0.2)

## 2019-08-17 LAB — COMPREHENSIVE METABOLIC PANEL
ALT: 15 U/L (ref 0–44)
AST: 13 U/L — ABNORMAL LOW (ref 15–41)
Albumin: 3.6 g/dL (ref 3.5–5.0)
Alkaline Phosphatase: 56 U/L (ref 38–126)
Anion gap: 8 (ref 5–15)
BUN: 18 mg/dL (ref 8–23)
CO2: 27 mmol/L (ref 22–32)
Calcium: 9 mg/dL (ref 8.9–10.3)
Chloride: 106 mmol/L (ref 98–111)
Creatinine, Ser: 1.35 mg/dL — ABNORMAL HIGH (ref 0.61–1.24)
GFR calc Af Amer: >60 mL/min (ref >60)
GFR calc non Af Amer: 55 mL/min — ABNORMAL LOW (ref >60)
Glucose, Bld: 117 mg/dL — ABNORMAL HIGH (ref 70–99)
Potassium: 3.9 mmol/L (ref 3.5–5.1)
Sodium: 141 mmol/L (ref 135–145)
Total Bilirubin: 1 mg/dL (ref 0.3–1.2)
Total Protein: 6 g/dL — ABNORMAL LOW (ref 6.5–8.1)

## 2019-08-17 LAB — VITAMIN D 25 HYDROXY (VIT D DEFICIENCY, FRACTURES): Vit D, 25-Hydroxy: 51.1 ng/mL (ref 30–100)

## 2019-08-17 LAB — LACTATE DEHYDROGENASE: LDH: 128 U/L (ref 98–192)

## 2019-08-17 LAB — MAGNESIUM: Magnesium: 2 mg/dL (ref 1.7–2.4)

## 2019-08-18 ENCOUNTER — Inpatient Hospital Stay (HOSPITAL_COMMUNITY): Payer: 59

## 2019-08-18 ENCOUNTER — Encounter (HOSPITAL_COMMUNITY): Payer: Self-pay | Admitting: Hematology

## 2019-08-18 ENCOUNTER — Other Ambulatory Visit: Payer: Self-pay

## 2019-08-18 ENCOUNTER — Inpatient Hospital Stay (HOSPITAL_COMMUNITY): Payer: 59 | Admitting: Hematology

## 2019-08-18 VITALS — BP 146/83 | HR 62 | Temp 97.2°F | Resp 18 | Wt 191.9 lb

## 2019-08-18 DIAGNOSIS — C187 Malignant neoplasm of sigmoid colon: Secondary | ICD-10-CM

## 2019-08-18 NOTE — Progress Notes (Signed)
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 Focal narrowing and wall thickening of the sigmoid colon is noted in this area most likely due to inflammation, but neoplasm cannot be excluded, and sigmoidoscopy is recommended for further evaluation.   03/20/2016 Procedure   Colonoscopy by Dr. Laural Golden- Malignant partially obstructing tumor in the distal sigmoid colon. Biopsied. - Incomplete exam.   03/23/2016 Pathology Results   Colon, biopsy, sigmoid mass - ADENOCARCINOMA.   03/23/2016 Procedure   Low anterior resection with transanal anastomosis   03/26/2016 Pathology Results   Colon, segmental resection for tumor, sigmoid - INVASIVE WELL DIFFERENTIATED ADENOCARCINOMA, SPANNING 5 CM IN GREATEST DIMENSION. - TUMOR INVADES THROUGH MUSCULARIS PROPRIA TO INVOLVE SUBSEROSAL SOFT TISSUES. - UNDERLYING MARKED ACUTE AND CHRONIC INFLAMMATION WITH GIANT CELL REACTION AND ABSCESS FORMATION EXTENDING TO THE SEROSAL SURFACE AND INVOLVING THE MESENTERIC TISSUE. - MARGINS ARE NEGATIVE FOR TUMOR. - FOURTEEN BENIGN LYMPH NODES WHICH DEMONSTRATE VARIABLE INVOLVEMENT WITH ACUTE AND CHRONIC INFLAMMATION WITH ASSOCIATED GIANT CELLS WITHOUT TUMOR IDENTIFIED (0/14).   04/08/2016 Cancer Staging   Cancer Staging Adenocarcinoma of sigmoid colon [facility]) Staging form: Colon and Rectum, AJCC 8th Edition - Pathologic stage from 04/01/2016: Stage IIA (pT3, pN0, cM0)  - Signed by Baird Cancer, PA-C on 04/01/2016    07/09/2016 Imaging   CT abd/pelvis- No evidence of metastatic disease or other acute findings.  Bilateral nonobstructing renal calculi and mild bilateral renal parenchymal scarring.  Stable mildly enlarged prostate gland and findings of chronic bladder outlet obstruction.   08/16/2018 -  Chemotherapy   The patient had palonosetron (ALOXI) injection 0.25 mg, 0.25 mg, Intravenous,  Once, 12 of 12 cycles Administration: 0.25 mg (08/16/2018), 0.25 mg (09/07/2018), 0.25 mg (10/19/2018), 0.25 mg (11/02/2018), 0.25 mg (11/16/2018), 0.25 mg (11/30/2018), 0.25 mg (03/21/2019), 0.25 mg (04/04/2019), 0.25 mg (04/18/2019), 0.25 mg (05/02/2019), 0.25 mg (05/17/2019), 0.25 mg (05/31/2019) pegfilgrastim (NEULASTA) injection 6 mg, 6 mg, Subcutaneous, Once, 12 of 12 cycles Administration: 6 mg (08/18/2018), 6 mg (09/09/2018), 6 mg (10/21/2018), 6 mg (11/04/2018), 6 mg (11/18/2018), 6 mg (12/02/2018), 6 mg (03/23/2019), 6 mg (04/06/2019), 6 mg (04/20/2019), 6 mg (05/04/2019), 6 mg (05/19/2019), 6 mg (06/02/2019) irinotecan (CAMPTOSAR) 380 mg in sodium chloride 0.9 % 500 mL chemo infusion, 165 mg/m2 = 380 mg, Intravenous,  Once, 1 of 1 cycle Administration: 380 mg (08/16/2018) leucovorin 900 mg in dextrose 5 % 250 mL infusion, 400 mg/m2 = 900 mg, Intravenous,  Once, 12 of 12 cycles Administration: 900 mg (08/16/2018), 900 mg (09/07/2018), 900 mg (10/19/2018), 900 mg (11/02/2018), 900 mg (11/16/2018), 800 mg (11/30/2018), 900 mg (03/21/2019), 900 mg (04/04/2019), 900 mg (04/18/2019), 900 mg (05/02/2019), 900 mg (05/17/2019), 800 mg (05/31/2019) oxaliplatin (ELOXATIN) 190 mg in dextrose 5 % 500 mL chemo infusion, 85 mg/m2 = 190 mg, Intravenous,  Once, 12 of 12 cycles Dose modification: 68 mg/m2 (80 % of original dose 85 mg/m2, Cycle 2, Reason: Provider Judgment), 68 mg/m2 (80 % of original dose 85 mg/m2, Cycle 3, Reason:  Provider Judgment) Administration: 190 mg (08/16/2018), 150 mg (09/07/2018), 150 mg (10/19/2018),  150 mg (11/16/2018), 150 mg (11/02/2018), 135 mg (11/30/2018), 150 mg (03/21/2019), 150 mg (04/04/2019), 150 mg (04/18/2019), 150 mg (05/02/2019), 150 mg (05/17/2019), 130 mg (05/31/2019) fluorouracil (ADRUCIL) chemo injection 700 mg, 320 mg/m2 = 700 mg (100 % of original dose 320 mg/m2), Intravenous,  Once, 9 of 9 cycles Dose modification: 320 mg/m2 (original dose 320 mg/m2, Cycle 4) Administration: 700 mg (11/02/2018), 700 mg (11/16/2018), 650 mg (11/30/2018), 700 mg (03/21/2019), 700 mg (04/04/2019), 700 mg (04/18/2019), 700 mg (05/02/2019), 700 mg (05/17/2019), 700 mg (05/31/2019) fosaprepitant (EMEND) 150 mg, dexamethasone (DECADRON) 12 mg in sodium chloride 0.9 % 145 mL IVPB, , Intravenous,  Once, 12 of 12 cycles Administration:  (08/16/2018),  (09/07/2018),  (10/19/2018),  (11/02/2018),  (11/16/2018),  (11/30/2018),  (03/21/2019),  (04/04/2019),  (04/18/2019),  (05/02/2019),  (05/17/2019),  (05/31/2019) fluorouracil (ADRUCIL) 5,400 mg in sodium chloride 0.9 % 142 mL chemo infusion, 2,400 mg/m2 = 5,400 mg, Intravenous, 1 Day/Dose, 12 of 12 cycles Dose modification: 1,920 mg/m2 (80 % of original dose 2,400 mg/m2, Cycle 3, Reason: Provider Judgment) Administration: 5,400 mg (08/16/2018), 5,400 mg (09/07/2018), 4,300 mg (10/19/2018), 4,300 mg (11/02/2018), 4,300 mg (11/16/2018), 3,850 mg (11/30/2018), 4,300 mg (03/21/2019), 4,300 mg (04/04/2019), 4,300 mg (04/18/2019), 4,300 mg (05/02/2019), 4,300 mg (05/17/2019), 3,850 mg (05/31/2019) bevacizumab-awwb (MVASI) 500 mg in sodium chloride 0.9 % 100 mL chemo infusion, 5 mg/kg = 500 mg (100 % of original dose 5 mg/kg), Intravenous,  Once, 6 of 6 cycles Dose modification: 5 mg/kg (original dose 5 mg/kg, Cycle 1), 5 mg/kg (original dose 5 mg/kg, Cycle 2) Administration: 500 mg (08/16/2018), 500 mg (09/07/2018), 500 mg (10/19/2018), 500 mg (11/02/2018), 450 mg (11/16/2018), 400 mg (11/30/2018)  for chemotherapy treatment.      CANCER STAGING: Cancer Staging Adenocarcinoma of sigmoid colon [facility]) Staging form:  Colon and Rectum, AJCC 8th Edition - Pathologic stage from 04/01/2016: Stage IIA (pT3, pN0, cM0) - Signed by Baird Cancer, PA-C on 04/01/2016   INTERVAL HISTORY:  [name], a 64 y.o. 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 Troy Smith was last seen on 07/25/2019.  Today he reports that he finished his radiation treatments on 08/03/2019. His appetite is improving, diarrhea is also improving, energy levels are slowing improving. He had swelling around his ankles and feet on 08/16/2019 after working in the yard, but today it has improved. He reports soreness in his rectal area, especially after a hard stool.   REVIEW OF SYSTEMS:  Review of Systems  Constitutional: Positive for appetite change (mildly decreased) and fatigue (mild).  Cardiovascular: Positive for leg swelling (around the ankles).  Gastrointestinal: Positive for diarrhea (intermittent).  Neurological: Positive for numbness (hands).  All other systems reviewed and are negative.   PAST MEDICAL/SURGICAL HISTORY:  Past Medical History:  Diagnosis Date  . Adenocarcinoma of sigmoid colon ([place]) 03/23/2016  . Carpal tunnel syndrome 05/02/2014   Bilateral  . Cervical disc disorder with radiculopathy of cervical region 05/02/2014   Left C8  . Diabetes mellitus without complication ([place])   . Hypertension   . Port-A-Cath in place 08/10/2018   Past Surgical History:  Procedure Laterality Date  . BIOPSY  03/20/2016   Procedure: BIOPSY;  Surgeon: Rogene Houston, MD;  Location: [facility];  Service: Endoscopy;;  colon  . BOWEL RESECTION N/A 03/23/2016   Procedure: LOW ANTERIOR RESECTION;  Surgeon: Aviva Signs, MD;  Location: [facility] ORS;  Service: General;  Laterality: N/A;  . COLON SURGERY  01/2019   [facility] at [facility]  . COLONOSCOPY N/A 03/20/2016   Procedure: COLONOSCOPY;  Surgeon: Rogene Houston, MD;  Location: [facility] END[facility];  Service: Endoscopy;  Laterality: N/A;  730  . COLONOSCOPY  N/A 07/10/2016   Procedure: COLONOSCOPY;  Surgeon: Rogene Houston, MD;  Location: [facility];  Service: Endoscopy;  Laterality: N/A;  1040  . NO PAST SURGERIES    . POLYPECTOMY  07/10/2016   Procedure: POLYPECTOMY;  Surgeon: Rogene Houston, MD;  Location: [facility];  Service: Endoscopy;;  colon  . PORTACATH PLACEMENT Left 08/05/2018   Procedure: INSERTION PORT-A-CATH;  Surgeon: Aviva Signs, MD;  Location: [facility] ORS;  Service: General;  Laterality: Left;  . TONSILLECTOMY      SOCIAL HISTORY:  Social History   Socioeconomic History  . Marital status: Married    Spouse name: Not on file  . Number of children: Not on file  . Years of education: Not on file  . Highest education level: Not on file  Occupational History  . Not on file  Tobacco Use  . Smoking status: Never Smoker  . Smokeless tobacco: Never Used  Vaping Use  . Vaping Use: Never used  Substance and Sexual Activity  . Alcohol use: Yes    Comment: rarely  . Drug use: No  . Sexual activity: Not on file  Other Topics Concern  . Not on file  Social History Narrative  . Not on file   Social Determinants of Health   Financial Resource Strain:   . Difficulty of Paying Living Expenses:   Food Insecurity:   . Worried About Charity fundraiser in the Last Year:   . Arboriculturist in the Last Year:   Transportation Needs:   . Film/video editor (Medical):   Marland Kitchen Lack of Transportation (Non-Medical):   Physical Activity:   . Days of Exercise per Week:   . Minutes of Exercise per Session:   Stress:   . Feeling of Stress :   Social Connections:   . Frequency of Communication with Friends and Family:   . Frequency of Social Gatherings with Friends and Family:   . Attends Religious Services:   . Active Member of Clubs or Organizations:   . Attends Archivist Meetings:   Marland Kitchen Marital Status:   Intimate Partner Violence:   . Fear of Current or Ex-Partner:   . Emotionally Abused:   Marland Kitchen Physically Abused:     . Sexually Abused:     FAMILY HISTORY:  Family History  Problem Relation Age of Onset  . COPD Mother   . Melanoma Father     CURRENT MEDICATIONS:  Current Outpatient Medications  Medication Sig Dispense Refill  . allopurinol (ZYLOPRIM) 300 MG tablet TAKE ONE TABLET BY MOUTH ONCE DAILY. 30 tablet 0  . amLODipine (NORVASC) 10 MG tablet Take 10 mg by mouth daily.    Marland Kitchen dextrose 5 % SOLN 1,000 mL with fluorouracil 5 GM/100ML SOLN Inject into the vein over 48 hr.    . diphenoxylate-atropine (LOMOTIL) 2.5-0.025 MG tablet Take 1 tablet by mouth 4 (four) times daily as needed for diarrhea or loose stools. Take 2 tablets at the first onset of diarrhea followed by 1 tablet after every watery bowel movement. (Patient not taking: Reported on 08/11/2019) 60 tablet 2  . donepezil (ARICEPT) 10 MG tablet TAKE (1) TABLET BY MOUTH AT BEDTIME. 30 tablet  0  . febuxostat (ULORIC) 40 MG tablet Take 1 tablet (40 mg total) by mouth daily. 30 tablet 2  . insulin glargine (LANTUS) 100 UNIT/ML injection Inject 30 Units into the skin at bedtime.     Marland Kitchen loperamide (IMODIUM A-D) 2 MG tablet Take 2 at onset of diarrhea, then 1 after each watery bowel movement. (Patient not taking: Reported on 08/11/2019) 100 tablet 1  . magnesium oxide (MAG-OX) 400 (241.3 Mg) MG tablet Take 1 tablet (400 mg total) by mouth 2 (two) times daily. 60 tablet 2  . metoprolol succinate (TOPROL-XL) 50 MG 24 hr tablet Take 50 mg by mouth daily. Take with or immediately following a meal.    . prochlorperazine (COMPAZINE) 10 MG tablet Take 1 tablet (10 mg total) by mouth every 6 (six) hours as needed (Nausea or vomiting). (Patient not taking: Reported on 08/11/2019) 60 tablet 1  . scopolamine (TRANSDERM-SCOP) 1 MG/3DAYS Place 1 patch (1.5 mg total) onto the skin every 3 (three) days. 10 patch 12   No current facility-administered medications for this visit.    ALLERGIES:  Allergies  Allergen Reactions  . Penicillin G Nausea And Vomiting     PHYSICAL EXAM:  Performance status (ECOG): 1 - Symptomatic but completely ambulatory  Vitals:   08/18/19 1048  BP: (!) 146/83  Pulse: 62  Resp: 18  Temp: (!) 97.2 F (36.2 C)  SpO2: 98%   Wt Readings from Last 3 Encounters:  08/18/19 191 lb 14.4 oz (87 kg)  08/11/19 185 lb 11.2 oz (84.2 kg)  08/02/19 185 lb 14.4 oz (84.3 kg)   Physical Exam Vitals reviewed.  Constitutional:      Appearance: Normal appearance.  Cardiovascular:     Rate and Rhythm: Normal rate and regular rhythm.     Pulses: Normal pulses.     Heart sounds: Normal heart sounds.  Pulmonary:     Effort: Pulmonary effort is normal.     Breath sounds: Normal breath sounds.  Abdominal:     Palpations: Abdomen is soft. There is no hepatomegaly, splenomegaly or mass.     Tenderness: There is no abdominal tenderness.  Musculoskeletal:     Right lower leg: Edema (1+) present.     Left lower leg: Edema (1+) present.  Skin:    Findings: No rash or wound.  Neurological:     General: No focal deficit present.     Mental Status: He is alert and oriented to person, place, and time.  Psychiatric:        Mood and Affect: Mood normal.        Behavior: Behavior normal.      LABORATORY DATA:  I have reviewed the labs as listed.  CBC Latest Ref Rng & Units 08/17/2019 08/10/2019 08/01/2019  WBC 4.0 - 10.5 K/uL 4.2 4.3 4.4  Hemoglobin 13.0 - 17.0 g/dL 9.5(L) 9.3(L) 8.9(L)  Hematocrit 39 - 52 % 29.2(L) 29.4(L) 28.2(L)  Platelets 150 - 400 K/uL 144(L) 136(L) 141(L)   CMP Latest Ref Rng & Units 08/17/2019 08/10/2019 08/01/2019  Glucose 70 - 99 mg/dL 117(H) 125(H) 88  BUN 8 - 23 mg/dL 18 19 23   Creatinine 0.61 - 1.24 mg/dL 1.35(H) 1.52(H) 1.67(H)  Sodium 135 - 145 mmol/L 141 140 140  Potassium 3.5 - 5.1 mmol/L 3.9 3.6 3.5  Chloride 98 - 111 mmol/L 106 106 105  CO2 22 - 32 mmol/L 27 24 25   Calcium 8.9 - 10.3 mg/dL 9.0 9.2 9.1  Total Protein 6.5 - 8.1 g/dL  6.0(L) 6.0(L) 5.9(L)  Total Bilirubin 0.3 - 1.2 mg/dL 1.0 0.7 0.7   Alkaline Phos 38 - 126 U/L 56 56 54  AST 15 - 41 U/L 13(L) 16 13(L)  ALT 0 - 44 U/L 15 14 13    Lab Results  Component Value Date   TIBC 240 (L) 08/10/2019   TIBC 308 03/13/2019   TIBC 336 04/14/2017   FERRITIN 230 08/10/2019   FERRITIN 59 03/13/2019   FERRITIN 74 04/14/2017   IRONPCTSAT 22 08/10/2019   IRONPCTSAT 14 (L) 03/13/2019   IRONPCTSAT 25 04/14/2017   Lab Results  Component Value Date   LDH 128 08/17/2019   LDH 134 08/10/2019   LDH 115 05/31/2019   Lab Results  Component Value Date   VD25OH 51.10 08/17/2019   VD25OH 58.82 08/10/2019    DIAGNOSTIC IMAGING:  I have independently reviewed the scans and discussed with the patient.   ASSESSMENT:  1. 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 -Laparoscopic partial hepatectomy, redo low anterior resection on 02/01/2019, pathology showing YPT3Y PN 0. Positive circumferential/radial/mesenteric margin. -6 more cycles of FOLFOX completed on 05/31/2019. -Xeloda with radiation from 06/26/2019 through 08/03/2019.   PLAN:  1. Metastatic sigmoid colon cancer to the liver: -He has completed chemoradiation on 08/03/2019. -Energy levels and appetite has been improving. -I have reviewed his labs which showed improving creatinine.  LFTs are normal. -I have recommended CT scan CAP, CEA and routine blood work in 1 month.  We will plan to repeat labs and scans every 3 to 6 months during the first year.  2. Diarrhea: -He is taking Lomotil as needed.  3. Hypomagnesemia: -Continue magnesium twice daily.  4. 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  Neurology referral in July.  4. Gout: -Continue Uloric 40 mg daily.   Orders placed this encounter:  No orders of the defined types were placed in this encounter.    Derek Jack,  MD [place] [facility] [phone]   I, Milinda Antis, am acting as a scribe for Dr. Sanda Linger.  I, Derek Jack MD, have reviewed the above documentation for accuracy and completeness, and I agree with the above.

## 2019-08-18 NOTE — Patient Instructions (Addendum)
[place] at [facility] Discharge Instructions  You were seen today by Dr. Delton Coombes. He went over your recent results. Keep taking a stool softener to prevent hard stools. Apply Vaseline to cracked skin as needed. Elevate your legs at night to prevent swelling. You will be scheduled for a CT scan of your chest and abdomen. Dr. Delton Coombes will see you back in 5 weeks for labs and follow up.   Thank you for choosing [place] at [facility] to provide your oncology and hematology care.  To afford each patient quality time with our provider, please arrive at least 15 minutes before your scheduled appointment time.   If you have a lab appointment with the [facility] please come in thru the Main Entrance and check in at the main information desk  You need to re-schedule your appointment should you arrive 10 or more minutes late.  We strive to give you quality time with our providers, and arriving late affects you and other patients whose appointments are after yours.  Also, if you no show three or more times for appointments you may be dismissed from the clinic at the providers discretion.     Again, thank you for choosing [facility].  Our hope is that these requests will decrease the amount of time that you wait before being seen by our physicians.       _____________________________________________________________  Should you have questions after your visit to [facility], please contact our office at [phone] between the hours of 8:00 a.m. and 4:30 p.m.  Voicemails left after 4:00 p.m. will not be returned until the following business day.  For prescription refill requests, have your pharmacy contact our office and allow 72 hours.    Cancer Center Support Programs:   > Cancer Support Group  2nd Tuesday of the month 1pm-2pm, Journey Room

## 2019-08-18 NOTE — Progress Notes (Signed)
1150 Labs reviewed with and pt seen by Dr. Delton Coombes and pt not to receive IV hydration today per MD

## 2019-08-28 ENCOUNTER — Encounter: Payer: Self-pay | Admitting: Neurology

## 2019-08-28 ENCOUNTER — Ambulatory Visit: Payer: 59 | Admitting: Neurology

## 2019-08-28 VITALS — BP 154/82 | HR 61 | Ht 71.0 in | Wt 189.0 lb

## 2019-08-28 DIAGNOSIS — R413 Other amnesia: Secondary | ICD-10-CM | POA: Diagnosis not present

## 2019-08-28 NOTE — Progress Notes (Signed)
Reason for visit: Memory disturbance  Referring physician: Dr. Dan Humphreys is a 64 y.o. male  History of present illness:  Troy Smith is a 64 year old right-handed white male who is a retired Pharmacist, community.  Troy Smith comes in with his wife today.  Troy wife indicates that Troy Smith has had some short-term memory issues over Troy last 18 months to up to 2 years.  Troy short-term memory issues significantly worsened after he was diagnosed with colon cancer in June 2020.  He started radiation therapy and chemotherapy which worsened his confusion significantly.  Troy Smith underwent MRI of Troy brain with and without contrast in August 2020 secondary to confusion at that time.  A CT of Troy head was done on 26 Jun 2019 also for confusion, Troy CT scan was done without contrast and was again unremarkable.  Troy Smith has had a 40 pound weight loss with treatment of Troy cancer.  He denies any significant family history of memory troubles, his mother did develop some dementia in her 79s.  Troy Smith is on Aricept, he has been on this medication since September 2020.  Troy Smith is sleeping well, he has a good energy level during Troy day.  He reads a lot and is able to retain what he is reading and keep up with Troy plot.  Troy Smith does receive some assistance keeping up with appointments, he manages her own medications.  He is able to operate a motor vehicle without difficulty.  Troy wife does Troy finances since he was diagnosed with cancer, but he was able to do this prior to Troy cancer diagnosis.  He does report some tingling in Troy fourth and fifth fingers of Troy hands bilaterally with chemotherapy.  He denies any balance issues or difficulty controlling Troy bowels or Troy bladder.  He does wear bilateral hearing aids.  He is sent to this office for further evaluation.  Past Medical History:  Diagnosis Date  . 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 Carpal tunnel syndrome  05/02/2014   Bilateral  . Cervical disc disorder with radiculopathy of cervical region 05/02/2014   Left C8  . Diabetes mellitus without complication ([place])   . Hypertension   . Port-A-Cath in place 08/10/2018    Past Surgical History:  Procedure Laterality Date  . BIOPSY  03/20/2016   Procedure: BIOPSY;  Surgeon: Rogene Houston, MD;  [facility];  Service: Endoscopy;;  colon  . BOWEL RESECTION N/A 03/23/2016   Procedure: LOW ANTERIOR RESECTION;  Surgeon: Aviva Signs, MD;  Location: [facility];  Service: General;  Laterality: N/A;  . COLON SURGERY  01/2019   Done at [facility]  . COLONOSCOPY N/A 03/20/2016   Procedure: COLONOSCOPY;  Surgeon: Rogene Houston, MD;  Location: [facility];  Service: Endoscopy;  Laterality: N/A;  730  . COLONOSCOPY N/A 07/10/2016   Procedure: COLONOSCOPY;  Surgeon: Rogene Houston, MD;  Location: [facility];  Service: Endoscopy;  Laterality: N/A;  1040  . NO PAST SURGERIES    . POLYPECTOMY  07/10/2016   Procedure: POLYPECTOMY;  Surgeon: Rogene Houston, MD;  [facility];  Service: Endoscopy;;  colon  . PORTACATH PLACEMENT Left 08/05/2018   Procedure: INSERTION PORT-A-CATH;  Surgeon: Aviva Signs, MD;  Location: [facility];  Service: General;  Laterality: Left;  . TONSILLECTOMY      Family History  Problem Relation Age of Onset  . COPD Mother   .  Melanoma Father     Social history:  reports that he has never smoked. He has never used smokeless tobacco. He reports current alcohol use. He reports that he does not use drugs.  Medications:  Prior to Admission medications   Medication Sig Start Date End Date Taking? Authorizing Provider  allopurinol (ZYLOPRIM) 300 MG tablet TAKE ONE TABLET BY MOUTH ONCE DAILY. 06/20/19  Yes Lockamy, Randi L, NP-C  amLODipine (NORVASC) 10 MG tablet Take 10 mg by mouth daily.   Yes [provider]  diphenoxylate-atropine (LOMOTIL) 2.5-0.025 MG tablet Take 1 tablet by mouth 4 (four) times daily as  needed for diarrhea or loose stools. Take 2 tablets at Troy first onset of diarrhea followed by 1 tablet after every watery bowel movement. 06/26/19  Yes Derek Jack, MD  donepezil (ARICEPT) 10 MG tablet TAKE (1) TABLET BY MOUTH AT BEDTIME. 06/20/19  Yes Lockamy, Randi L, NP-C  febuxostat (ULORIC) 40 MG tablet Take 1 tablet (40 mg total) by mouth daily. 06/26/19  Yes Derek Jack, MD  insulin glargine (LANTUS) 100 UNIT/ML injection Inject 30 Units into Troy skin at bedtime.    Yes [provider]  loperamide (IMODIUM A-D) 2 MG tablet Take 2 at onset of diarrhea, then 1 after each watery bowel movement. 06/15/19  Yes Derek Jack, MD  magnesium oxide (MAG-OX) 400 (241.3 Mg) MG tablet Take 1 tablet (400 mg total) by mouth 2 (two) times daily. 07/11/19  Yes Derek Jack, MD  metoprolol succinate (TOPROL-XL) 50 MG 24 hr tablet Take 50 mg by mouth daily. Take with or immediately following a meal.   Yes [provider]  prochlorperazine (COMPAZINE) 10 MG tablet Take 1 tablet (10 mg total) by mouth every 6 (six) hours as needed (Nausea or vomiting). 03/13/19  Yes Derek Jack, MD  scopolamine (TRANSDERM-SCOP) 1 MG/3DAYS Place 1 patch (1.5 mg total) onto Troy skin every 3 (three) days. 08/29/18  Yes Lockamy, Randi L, NP-C      Allergies  Allergen Reactions  . Penicillin G Nausea And Vomiting    ROS:  Out of a complete 14 system review of symptoms, Troy Smith complains only of Troy following symptoms, and all other reviewed systems are negative.  Weight loss Memory problems Hearing deficit  Blood pressure (!) 154/82, pulse 61, height 5\' 11"  (1.803 m), weight 189 lb (85.7 kg).  Physical Exam  General: Troy Smith is alert and cooperative at Troy time of Troy examination.  Eyes: Pupils are equal, round, and reactive to light. Discs are flat bilaterally.  Neck: Troy neck is supple, no carotid bruits are noted, but there appears to be bilateral radiation  of a cardiac murmur into Troy carotids.  Respiratory: Troy respiratory examination is clear.  Cardiovascular: Troy cardiovascular examination reveals a regular rate and rhythm, with a grade 3/6 systolic ejection murmur at Troy aortic area.  Skin: Extremities are with 2+ edema below Troy knees bilaterally.  Neurologic Exam  Mental status: Troy Smith is alert and oriented x 3 at Troy time of Troy examination. Troy Mini-Mental status examination done today shows a total score of 23/30.  Cranial nerves: Facial symmetry is present. There is good sensation of Troy face to pinprick and soft touch bilaterally. Troy strength of Troy facial muscles and Troy muscles to head turning and shoulder shrug are normal bilaterally. Speech is well enunciated, no aphasia or dysarthria is noted. Extraocular movements are full. Visual fields are full. Troy tongue is midline, and Troy Smith has symmetric elevation of  Troy soft palate. No obvious hearing deficits are noted.  Motor: Troy motor testing reveals 5 over 5 strength of all 4 extremities. Good symmetric motor tone is noted throughout.  Sensory: Sensory testing is intact to pinprick, soft touch, vibration sensation, and position sense on Troy upper extremities.  With Troy lower extremities there is a stocking pattern pinprick sensory deficit one half way up Troy legs bilaterally with impairment of vibration and position sense in both feet.  No evidence of extinction is noted.  Coordination: Cerebellar testing reveals good finger-nose-finger and heel-to-shin bilaterally.  Gait and station: Gait is normal. Tandem gait is slightly unsteady. Romberg is negative. No drift is seen.  Reflexes: Deep tendon reflexes are symmetric, but are somewhat depressed bilaterally. Toes are downgoing bilaterally.   CT head 06/26/19:  IMPRESSION: Negative noncontrast head CT.  * CT scan images were reviewed online. I agree with Troy written report.   MRI brain  09/16/18:  IMPRESSION: Normal appearance of Troy brain. No abnormality seen to explain Troy clinical presentation.    Assessment/Plan:  1.  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  Troy memory issues have continually worsened gradually over time, he remains on Aricept.  We will check further blood work today.  He will follow-up here in 6 months.  Mini-Mental Status Examination does suggest a significant cognitive impairment at this time.  Jill Alexanders MD 08/28/2019 11:30 AM  [facility] [address] [place], [place] [address]  Phone [phone] Fax [phone]

## 2019-09-04 LAB — SEDIMENTATION RATE: Sed Rate: 13 mm/hr (ref 0–30)

## 2019-09-04 LAB — THYROID PEROXIDASE ANTIBODY: Thyroperoxidase Ab SerPl-aCnc: <8 IU/mL (ref 0–34)

## 2019-09-04 LAB — AMMONIA: Ammonia: 55 ug/dL (ref 40–200)

## 2019-09-04 LAB — COPPER, SERUM: Copper: 92 ug/dL (ref 69–132)

## 2019-09-04 LAB — HIV ANTIBODY (ROUTINE TESTING W REFLEX): HIV Screen 4th Generation wRfx: NONREACTIVE

## 2019-09-04 LAB — VITAMIN B1: Thiamine: 68.8 nmol/L (ref 66.5–200.0)

## 2019-09-04 LAB — RPR: RPR Ser Ql: NONREACTIVE

## 2019-09-06 ENCOUNTER — Telehealth: Payer: Self-pay | Admitting: Radiation Oncology

## 2019-09-06 NOTE — Telephone Encounter (Signed)
  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  Interval Since Last Radiation:  5 weeks   06/26/19-08/03/19: The patient's rectal postoperative margin was treated to 45 Gy in 25 fractions, followed by a 5.4 Gy boost over 3 fractions.  Narrative:  The patient was contacted today for routine follow-up. During treatment he did very well with radiotherapy and did not have significant desquamation.   Impression/Plan: 1. 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 I was unable to reach the patient or his wife by phone so I left a voicemail. On the voicemail discussed that we would be happy to continue to follow him as needed, but he will also continue to follow up with Dr. Delton Coombes in medical oncology and left our contact information if they had questions about prior treatment.     Carola Rhine, PAC

## 2019-09-06 NOTE — Telephone Encounter (Deleted)
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 Not sure how he's doing 31mfu

## 2019-09-12 ENCOUNTER — Telehealth: Payer: Self-pay | Admitting: Radiation Oncology

## 2019-09-12 NOTE — Telephone Encounter (Signed)
I left a voicemail for the patient to call me back after trying to return his call.

## 2019-09-14 ENCOUNTER — Inpatient Hospital Stay (HOSPITAL_COMMUNITY): Payer: 59 | Attending: Hematology

## 2019-09-14 ENCOUNTER — Ambulatory Visit (HOSPITAL_COMMUNITY)
Admission: RE | Admit: 2019-09-14 | Discharge: 2019-09-14 | Disposition: A | Discharge status: Home / Self Care | Payer: 59 | Source: Ambulatory Visit | Attending: Hematology | Admitting: Hematology

## 2019-09-14 ENCOUNTER — Other Ambulatory Visit: Payer: Self-pay

## 2019-09-14 DIAGNOSIS — C187 Malignant neoplasm of sigmoid colon: Secondary | ICD-10-CM | POA: Diagnosis not present

## 2019-09-14 DIAGNOSIS — I2510 Atherosclerotic heart disease of native coronary artery without angina pectoris: Secondary | ICD-10-CM | POA: Insufficient documentation

## 2019-09-14 DIAGNOSIS — E1122 Type 2 diabetes mellitus with diabetic chronic kidney disease: Secondary | ICD-10-CM | POA: Diagnosis not present

## 2019-09-14 DIAGNOSIS — R197 Diarrhea, unspecified: Secondary | ICD-10-CM | POA: Diagnosis not present

## 2019-09-14 DIAGNOSIS — C787 Secondary malignant neoplasm of liver and intrahepatic bile duct: Secondary | ICD-10-CM | POA: Diagnosis not present

## 2019-09-14 DIAGNOSIS — N189 Chronic kidney disease, unspecified: Secondary | ICD-10-CM | POA: Diagnosis not present

## 2019-09-14 DIAGNOSIS — I129 Hypertensive chronic kidney disease with stage 1 through stage 4 chronic kidney disease, or unspecified chronic kidney disease: Secondary | ICD-10-CM | POA: Diagnosis not present

## 2019-09-14 DIAGNOSIS — Z79899 Other long term (current) drug therapy: Secondary | ICD-10-CM | POA: Diagnosis not present

## 2019-09-14 LAB — CBC WITH DIFFERENTIAL/PLATELET
Abs Immature Granulocytes: 0.02 10*3/uL (ref 0.00–0.07)
Basophils Absolute: 0 10*3/uL (ref 0.0–0.1)
Basophils Relative: 0 %
Eosinophils Absolute: 0.2 10*3/uL (ref 0.0–0.5)
Eosinophils Relative: 3 %
HCT: 32.4 % — ABNORMAL LOW (ref 39.0–52.0)
Hemoglobin: 10.5 g/dL — ABNORMAL LOW (ref 13.0–17.0)
Immature Granulocytes: 0 %
Lymphocytes Relative: 13 %
Lymphs Abs: 0.6 10*3/uL — ABNORMAL LOW (ref 0.7–4.0)
MCH: 30.6 pg (ref 26.0–34.0)
MCHC: 32.4 g/dL (ref 30.0–36.0)
MCV: 94.5 fL (ref 80.0–100.0)
Monocytes Absolute: 0.3 10*3/uL (ref 0.1–1.0)
Monocytes Relative: 7 %
Neutro Abs: 3.8 10*3/uL (ref 1.7–7.7)
Neutrophils Relative %: 77 %
Platelets: 133 10*3/uL — ABNORMAL LOW (ref 150–400)
RBC: 3.43 MIL/uL — ABNORMAL LOW (ref 4.22–5.81)
RDW: 13.4 % (ref 11.5–15.5)
WBC: 4.9 10*3/uL (ref 4.0–10.5)
nRBC: 0 % (ref 0.0–0.2)

## 2019-09-14 LAB — COMPREHENSIVE METABOLIC PANEL
ALT: 14 U/L (ref 0–44)
AST: 15 U/L (ref 15–41)
Albumin: 4 g/dL (ref 3.5–5.0)
Alkaline Phosphatase: 61 U/L (ref 38–126)
Anion gap: 11 (ref 5–15)
BUN: 24 mg/dL — ABNORMAL HIGH (ref 8–23)
CO2: 25 mmol/L (ref 22–32)
Calcium: 9.7 mg/dL (ref 8.9–10.3)
Chloride: 103 mmol/L (ref 98–111)
Creatinine, Ser: 1.48 mg/dL — ABNORMAL HIGH (ref 0.61–1.24)
GFR calc Af Amer: 58 mL/min — ABNORMAL LOW (ref >60)
GFR calc non Af Amer: 50 mL/min — ABNORMAL LOW (ref >60)
Glucose, Bld: 130 mg/dL — ABNORMAL HIGH (ref 70–99)
Potassium: 3.8 mmol/L (ref 3.5–5.1)
Sodium: 139 mmol/L (ref 135–145)
Total Bilirubin: 1.1 mg/dL (ref 0.3–1.2)
Total Protein: 6.6 g/dL (ref 6.5–8.1)

## 2019-09-14 MED ORDER — IOHEXOL 300 MG/ML  SOLN
100.0000 mL | Freq: Once | INTRAMUSCULAR | Status: AC | PRN
  Administered 2019-09-14: 100 mL via INTRAVENOUS

## 2019-09-15 LAB — CEA: CEA: 2.1 ng/mL (ref 0.0–4.7)

## 2019-09-17 NOTE — Progress Notes (Signed)
  Radiation Oncology         [phone] ________________________________  Name: Troy Smith MRN: [id number]  Date: 08/03/2019  DOB: [birth year]  End of Treatment Note  Diagnosis:   Rectal cancer    Cancer Staging Adenocarcinoma of sigmoid colon [facility]) Staging form: Colon and Rectum, AJCC 8th Edition - Pathologic stage from 04/01/2016: Stage IIA (pT3, pN0, cM0) - Signed by Baird Cancer, PA-C on 04/01/2016   Indication for treatment:  Curative       Radiation treatment dates:   06/26/19 - 08/03/19  Site/dose:    The patient was treated to the pelvis to a dose of 45 Gy at 1.8 Gy per fraction. This was accomplished using a 4 field 3-D conformal technique. The patient then received a boost to the tumor and adjacent high-risk regions for an additional 5.4 Gy at 1.8 gray per fraction. This was carried out using a coned-down 4 field approach. The patient's total dose was 50.4 Gy. Daily AlignRT was used on a daily basis to insure proper patient positioning and localization of critical targets/ structures. The patient received concurrent chemotherapy during the course of radiation treatment.  Narrative: The patient tolerated radiation treatment relatively well.     Plan: The patient has completed radiation treatment. The patient will return to radiation oncology clinic for routine followup in one month. I advised the patient to call or return sooner if they have any questions or concerns related to their recovery or treatment.   ------------------------------------------------  Jodelle Gross, MD, PhD

## 2019-09-20 ENCOUNTER — Other Ambulatory Visit: Payer: Self-pay

## 2019-09-20 ENCOUNTER — Inpatient Hospital Stay (HOSPITAL_COMMUNITY): Payer: 59 | Admitting: Hematology

## 2019-09-20 VITALS — BP 129/86 | HR 76 | Temp 97.5°F | Resp 18 | Wt 183.4 lb

## 2019-09-20 DIAGNOSIS — C187 Malignant neoplasm of sigmoid colon: Secondary | ICD-10-CM

## 2019-09-20 NOTE — Patient Instructions (Signed)
[place] at [facility] Discharge Instructions  You were seen today by Dr. Delton Coombes. He went over your recent results and scans. Continue drinking plenty of water daily. You will be scheduled for a CT scan of your chest and abdomen before your next visit. Dr. Delton Coombes will see you back in 3 months for labs and follow up.   Thank you for choosing [place] at [facility] to provide your oncology and hematology care.  To afford each patient quality time with our provider, please arrive at least 15 minutes before your scheduled appointment time.   If you have a lab appointment with the [facility] please come in thru the Main Entrance and check in at the main information desk  You need to re-schedule your appointment should you arrive 10 or more minutes late.  We strive to give you quality time with our providers, and arriving late affects you and other patients whose appointments are after yours.  Also, if you no show three or more times for appointments you may be dismissed from the clinic at the providers discretion.     Again, thank you for choosing [facility].  Our hope is that these requests will decrease the amount of time that you wait before being seen by our physicians.       _____________________________________________________________  Should you have questions after your visit to [facility], please contact our office at [phone] between the hours of 8:00 a.m. and 4:30 p.m.  Voicemails left after 4:00 p.m. will not be returned until the following business day.  For prescription refill requests, have your pharmacy contact our office and allow 72 hours.    Cancer Center Support Programs:   > Cancer Support Group  2nd Tuesday of the month 1pm-2pm, Journey Room

## 2019-09-20 NOTE — Progress Notes (Signed)
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 FOLFOX x 12 cycles from 08/16/2018 to 05/31/2019. 2. Xeloda and radiation therapy from 06/26/2019 to 08/03/2019.  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 Focal narrowing and wall thickening of the sigmoid colon is noted in this area most likely due to inflammation, but neoplasm cannot be excluded, and sigmoidoscopy is recommended for further evaluation.   03/20/2016 Procedure   Colonoscopy by Dr. Laural Golden- Malignant partially obstructing tumor in the distal sigmoid colon. Biopsied. - Incomplete exam.   03/23/2016 Pathology Results   Colon, biopsy, sigmoid mass - ADENOCARCINOMA.   03/23/2016 Procedure   Low anterior resection with transanal anastomosis   03/26/2016 Pathology Results   Colon, segmental resection for tumor, sigmoid - INVASIVE WELL DIFFERENTIATED ADENOCARCINOMA, SPANNING 5 CM IN GREATEST DIMENSION. - TUMOR INVADES THROUGH MUSCULARIS PROPRIA TO INVOLVE SUBSEROSAL SOFT TISSUES. - UNDERLYING MARKED ACUTE AND CHRONIC INFLAMMATION WITH GIANT CELL REACTION AND ABSCESS FORMATION EXTENDING TO THE SEROSAL SURFACE AND INVOLVING THE MESENTERIC TISSUE. - MARGINS ARE NEGATIVE FOR TUMOR. - FOURTEEN BENIGN LYMPH NODES WHICH DEMONSTRATE VARIABLE INVOLVEMENT WITH ACUTE AND CHRONIC INFLAMMATION WITH ASSOCIATED GIANT CELLS WITHOUT TUMOR IDENTIFIED (0/14).   04/08/2016 Cancer Staging   Cancer Staging Adenocarcinoma of sigmoid colon [facility]) Staging form: Colon and Rectum, AJCC 8th  Edition - Pathologic stage from 04/01/2016: Stage IIA (pT3, pN0, cM0) - Signed by Baird Cancer, PA-C on 04/01/2016    07/09/2016 Imaging   CT abd/pelvis- No evidence of metastatic disease or other acute findings.  Bilateral nonobstructing renal calculi and mild bilateral renal parenchymal scarring.  Stable mildly enlarged prostate gland and findings of chronic bladder outlet obstruction.   08/16/2018 -  Chemotherapy   The patient had palonosetron (ALOXI) injection 0.25 mg, 0.25 mg, Intravenous,  Once, 12 of 12 cycles Administration: 0.25 mg (08/16/2018), 0.25 mg (09/07/2018), 0.25 mg (10/19/2018), 0.25 mg (11/02/2018), 0.25 mg (11/16/2018), 0.25 mg (11/30/2018), 0.25 mg (03/21/2019), 0.25 mg (04/04/2019), 0.25 mg (04/18/2019), 0.25 mg (05/02/2019), 0.25 mg (05/17/2019), 0.25 mg (05/31/2019) pegfilgrastim (NEULASTA) injection 6 mg, 6 mg, Subcutaneous, Once, 12 of 12 cycles Administration: 6 mg (08/18/2018), 6 mg (09/09/2018), 6 mg (10/21/2018), 6 mg (11/04/2018), 6 mg (11/18/2018), 6 mg (12/02/2018), 6 mg (03/23/2019), 6 mg (04/06/2019), 6 mg (04/20/2019), 6 mg (05/04/2019), 6 mg (05/19/2019), 6 mg (06/02/2019) irinotecan (CAMPTOSAR) 380 mg in sodium chloride 0.9 % 500 mL chemo infusion, 165 mg/m2 = 380 mg, Intravenous,  Once, 1 of 1 cycle Administration: 380 mg (08/16/2018) leucovorin 900 mg in dextrose 5 % 250 mL infusion, 400 mg/m2 = 900 mg, Intravenous,  Once, 12 of 12 cycles Administration: 900 mg (08/16/2018), 900 mg (09/07/2018), 900 mg (10/19/2018), 900 mg (11/02/2018), 900 mg (11/16/2018), 800 mg (11/30/2018), 900 mg (03/21/2019), 900 mg (04/04/2019), 900 mg (04/18/2019), 900 mg (05/02/2019), 900 mg (05/17/2019), 800 mg (05/31/2019) oxaliplatin (ELOXATIN) 190 mg in dextrose 5 % 500 mL chemo infusion, 85 mg/m2 = 190 mg, Intravenous,  Once, 12 of 12 cycles Dose modification: 68 mg/m2 (80 % of original dose 85 mg/m2, Cycle 2, Reason: Provider Judgment), 68 mg/m2 (  80 % of original dose 85 mg/m2, Cycle 3, Reason: Provider  Judgment) Administration: 190 mg (08/16/2018), 150 mg (09/07/2018), 150 mg (10/19/2018), 150 mg (11/16/2018), 150 mg (11/02/2018), 135 mg (11/30/2018), 150 mg (03/21/2019), 150 mg (04/04/2019), 150 mg (04/18/2019), 150 mg (05/02/2019), 150 mg (05/17/2019), 130 mg (05/31/2019) fluorouracil (ADRUCIL) chemo injection 700 mg, 320 mg/m2 = 700 mg (100 % of original dose 320 mg/m2), Intravenous,  Once, 9 of 9 cycles Dose modification: 320 mg/m2 (original dose 320 mg/m2, Cycle 4) Administration: 700 mg (11/02/2018), 700 mg (11/16/2018), 650 mg (11/30/2018), 700 mg (03/21/2019), 700 mg (04/04/2019), 700 mg (04/18/2019), 700 mg (05/02/2019), 700 mg (05/17/2019), 700 mg (05/31/2019) fosaprepitant (EMEND) 150 mg, dexamethasone (DECADRON) 12 mg in sodium chloride 0.9 % 145 mL IVPB, , Intravenous,  Once, 12 of 12 cycles Administration:  (08/16/2018),  (09/07/2018),  (10/19/2018),  (11/02/2018),  (11/16/2018),  (11/30/2018),  (03/21/2019),  (04/04/2019),  (04/18/2019),  (05/02/2019),  (05/17/2019),  (05/31/2019) fluorouracil (ADRUCIL) 5,400 mg in sodium chloride 0.9 % 142 mL chemo infusion, 2,400 mg/m2 = 5,400 mg, Intravenous, 1 Day/Dose, 12 of 12 cycles Dose modification: 1,920 mg/m2 (80 % of original dose 2,400 mg/m2, Cycle 3, Reason: Provider Judgment) Administration: 5,400 mg (08/16/2018), 5,400 mg (09/07/2018), 4,300 mg (10/19/2018), 4,300 mg (11/02/2018), 4,300 mg (11/16/2018), 3,850 mg (11/30/2018), 4,300 mg (03/21/2019), 4,300 mg (04/04/2019), 4,300 mg (04/18/2019), 4,300 mg (05/02/2019), 4,300 mg (05/17/2019), 3,850 mg (05/31/2019) bevacizumab-awwb (MVASI) 500 mg in sodium chloride 0.9 % 100 mL chemo infusion, 5 mg/kg = 500 mg (100 % of original dose 5 mg/kg), Intravenous,  Once, 6 of 6 cycles Dose modification: 5 mg/kg (original dose 5 mg/kg, Cycle 1), 5 mg/kg (original dose 5 mg/kg, Cycle 2) Administration: 500 mg (08/16/2018), 500 mg (09/07/2018), 500 mg (10/19/2018), 500 mg (11/02/2018), 450 mg (11/16/2018), 400 mg (11/30/2018)  for chemotherapy treatment.       CANCER STAGING: Cancer Staging Adenocarcinoma of sigmoid colon [facility]) Staging form: Colon and Rectum, AJCC 8th Edition - Pathologic stage from 04/01/2016: Stage IIA (pT3, pN0, cM0) - Signed by Baird Cancer, PA-C on 04/01/2016   INTERVAL HISTORY:  Mr. EDEM TIEGS, a 64 y.o. 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 Arvell was last seen on 08/18/2019.   Today he reports feeling well. He complains of numbness and mobility issues with the fingers in his right hand. He continues taking Aricept daily.   REVIEW OF SYSTEMS:  Review of Systems  Constitutional: Negative for appetite change and fatigue.  Gastrointestinal: Positive for diarrhea (occasional).  All other systems reviewed and are negative.   PAST MEDICAL/SURGICAL HISTORY:  Past Medical History:  Diagnosis Date  . Adenocarcinoma of sigmoid colon ([place]) 03/23/2016  . Carpal tunnel syndrome 05/02/2014   Bilateral  . Cervical disc disorder with radiculopathy of cervical region 05/02/2014   Left C8  . Diabetes mellitus without complication ([place])   . Hypertension   . Port-A-Cath in place 08/10/2018   Past Surgical History:  Procedure Laterality Date  . BIOPSY  03/20/2016   Procedure: BIOPSY;  Surgeon: Rogene Houston, MD;  Location: [facility];  Service: Endoscopy;;  colon  . BOWEL RESECTION N/A 03/23/2016   Procedure: LOW ANTERIOR RESECTION;  Surgeon: Aviva Signs, MD;  Location: [facility] ORS;  Service: General;  Laterality: N/A;  . COLON SURGERY  01/2019   Done at [facility]  . COLONOSCOPY N/A 03/20/2016   Procedure: COLONOSCOPY;  Surgeon: Rogene Houston, MD;  Location: [facility];  Service:  Endoscopy;  Laterality: N/A;  730  . COLONOSCOPY N/A 07/10/2016   Procedure: COLONOSCOPY;  Surgeon: Rogene Houston, MD;  Location: [facility];  Service: Endoscopy;  Laterality: N/A;  1040  . NO PAST SURGERIES    . POLYPECTOMY  07/10/2016   Procedure: POLYPECTOMY;  Surgeon: Rogene Houston,  MD;  Location: [facility];  Service: Endoscopy;;  colon  . PORTACATH PLACEMENT Left 08/05/2018   Procedure: INSERTION PORT-A-CATH;  Surgeon: Aviva Signs, MD;  Location: [facility] ORS;  Service: General;  Laterality: Left;  . TONSILLECTOMY      SOCIAL HISTORY:  Social History   Socioeconomic History  . Marital status: Married    Spouse name: Not on file  . Number of children: Not on file  . Years of education: Not on file  . Highest education level: Not on file  Occupational History  . Not on file  Tobacco Use  . Smoking status: Never Smoker  . Smokeless tobacco: Never Used  Vaping Use  . Vaping Use: Never used  Substance and Sexual Activity  . Alcohol use: Yes    Comment: rarely  . Drug use: No  . Sexual activity: Not on file  Other Topics Concern  . Not on file  Social History Narrative  . Not on file   Social Determinants of Health   Financial Resource Strain:   . Difficulty of Paying Living Expenses:   Food Insecurity:   . Worried About Charity fundraiser in the Last Year:   . Arboriculturist in the Last Year:   Transportation Needs:   . Film/video editor (Medical):   Marland Kitchen Lack of Transportation (Non-Medical):   Physical Activity:   . Days of Exercise per Week:   . Minutes of Exercise per Session:   Stress:   . Feeling of Stress :   Social Connections:   . Frequency of Communication with Friends and Family:   . Frequency of Social Gatherings with Friends and Family:   . Attends Religious Services:   . Active Member of Clubs or Organizations:   . Attends Archivist Meetings:   Marland Kitchen Marital Status:   Intimate Partner Violence:   . Fear of Current or Ex-Partner:   . Emotionally Abused:   Marland Kitchen Physically Abused:   . Sexually Abused:     FAMILY HISTORY:  Family History  Problem Relation Age of Onset  . COPD Mother   . Melanoma Father     CURRENT MEDICATIONS:  Current Outpatient Medications  Medication Sig Dispense Refill  . allopurinol  (ZYLOPRIM) 300 MG tablet TAKE ONE TABLET BY MOUTH ONCE DAILY. 30 tablet 0  . amLODipine (NORVASC) 10 MG tablet Take 10 mg by mouth daily.    . diphenoxylate-atropine (LOMOTIL) 2.5-0.025 MG tablet Take 1 tablet by mouth 4 (four) times daily as needed for diarrhea or loose stools. Take 2 tablets at the first onset of diarrhea followed by 1 tablet after every watery bowel movement. 60 tablet 2  . donepezil (ARICEPT) 10 MG tablet TAKE (1) TABLET BY MOUTH AT BEDTIME. 30 tablet 0  . febuxostat (ULORIC) 40 MG tablet Take 1 tablet (40 mg total) by mouth daily. 30 tablet 2  . insulin glargine (LANTUS) 100 UNIT/ML injection Inject 30 Units into the skin at bedtime.     Marland Kitchen loperamide (IMODIUM A-D) 2 MG tablet Take 2 at onset of diarrhea, then 1 after each watery bowel movement. 100 tablet 1  . magnesium oxide (MAG-OX)  400 (241.3 Mg) MG tablet Take 1 tablet (400 mg total) by mouth 2 (two) times daily. 60 tablet 2  . metoprolol succinate (TOPROL-XL) 50 MG 24 hr tablet Take 50 mg by mouth daily. Take with or immediately following a meal.    . prochlorperazine (COMPAZINE) 10 MG tablet Take 1 tablet (10 mg total) by mouth every 6 (six) hours as needed (Nausea or vomiting). 60 tablet 1  . scopolamine (TRANSDERM-SCOP) 1 MG/3DAYS Place 1 patch (1.5 mg total) onto the skin every 3 (three) days. 10 patch 12   No current facility-administered medications for this visit.    ALLERGIES:  Allergies  Allergen Reactions  . Penicillin G Nausea And Vomiting    PHYSICAL EXAM:  Performance status (ECOG): 1 - Symptomatic but completely ambulatory  Vitals:   09/20/19 1151  BP: 129/86  Pulse: 76  Resp: 18  Temp: (!) 97.5 F (36.4 C)  SpO2: 100%   Wt Readings from Last 3 Encounters:  09/20/19 183 lb 6.4 oz (83.2 kg)  08/28/19 189 lb (85.7 kg)  08/18/19 191 lb 14.4 oz (87 kg)   Physical Exam Vitals reviewed.  Constitutional:      Appearance: Normal appearance.  Cardiovascular:     Rate and Rhythm: Normal rate  and regular rhythm.     Pulses: Normal pulses.     Heart sounds: Normal heart sounds.  Pulmonary:     Effort: Pulmonary effort is normal.     Breath sounds: Normal breath sounds.  Abdominal:     Palpations: Abdomen is soft. There is no mass.     Tenderness: There is no abdominal tenderness.  Musculoskeletal:     Right lower leg: No edema.     Left lower leg: No edema.  Neurological:     General: No focal deficit present.     Mental Status: He is alert and oriented to person, place, and time.  Psychiatric:        Mood and Affect: Mood normal.        Behavior: Behavior normal.      LABORATORY DATA:  I have reviewed the labs as listed.  CBC Latest Ref Rng & Units 09/14/2019 08/17/2019 08/10/2019  WBC 4.0 - 10.5 K/uL 4.9 4.2 4.3  Hemoglobin 13.0 - 17.0 g/dL 10.5(L) 9.5(L) 9.3(L)  Hematocrit 39 - 52 % 32.4(L) 29.2(L) 29.4(L)  Platelets 150 - 400 K/uL 133(L) 144(L) 136(L)   CMP Latest Ref Rng & Units 09/14/2019 08/17/2019 08/10/2019  Glucose 70 - 99 mg/dL 130(H) 117(H) 125(H)  BUN 8 - 23 mg/dL 24(H) 18 19  Creatinine 0.61 - 1.24 mg/dL 1.48(H) 1.35(H) 1.52(H)  Sodium 135 - 145 mmol/L 139 141 140  Potassium 3.5 - 5.1 mmol/L 3.8 3.9 3.6  Chloride 98 - 111 mmol/L 103 106 106  CO2 22 - 32 mmol/L 25 27 24   Calcium 8.9 - 10.3 mg/dL 9.7 9.0 9.2  Total Protein 6.5 - 8.1 g/dL 6.6 6.0(L) 6.0(L)  Total Bilirubin 0.3 - 1.2 mg/dL 1.1 1.0 0.7  Alkaline Phos 38 - 126 U/L 61 56 56  AST 15 - 41 U/L 15 13(L) 16  ALT 0 - 44 U/L 14 15 14     DIAGNOSTIC IMAGING:  I have independently reviewed the scans and discussed with the patient. CT Chest W Contrast  Result Date: 09/14/2019 CLINICAL DATA:  Restaging metastatic colorectal cancer, status post resection, chemotherapy EXAM: CT CHEST, ABDOMEN, AND PELVIS WITH CONTRAST TECHNIQUE: Multidetector CT imaging of the chest, abdomen and pelvis was performed following  the standard protocol during bolus administration of intravenous contrast. CONTRAST:  157mL OMNIPAQUE  IOHEXOL 300 MG/ML SOLN, additional oral enteric contrast COMPARISON:  06/16/2019, 12/07/2018 FINDINGS: CT CHEST FINDINGS Cardiovascular: Left chest port catheter. Aortic valve calcifications. Normal heart size. Scattered coronary artery calcifications. No pericardial effusion. Mediastinum/Nodes: No enlarged mediastinal, hilar, or axillary lymph nodes. Thyroid gland, trachea, and esophagus demonstrate no significant findings. Lungs/Pleura: Multiple small bilateral pulmonary nodules are stable, for example 6 mm nodule of the subpleural right lower lobe (series 3, image 100), a 2 mm nodule of the anterior right upper lobe (series 3, image 37) and a 4 mm nodule of the left lower lobe (series 3, image 107). No pleural effusion or pneumothorax. Musculoskeletal: No chest wall mass or suspicious bone lesions identified. CT ABDOMEN PELVIS FINDINGS Hepatobiliary: Status post cholecystectomy and partial right hepatectomy with resection of a previously noted mass about the gallbladder fossa. No biliary dilatation. Pancreas: Unremarkable. No pancreatic ductal dilatation or surrounding inflammatory changes. Spleen: Unchanged splenomegaly, maximum coronal span 14.6 cm. Adrenals/Urinary Tract: Adrenal glands are unremarkable. Small bilateral nonobstructive renal calculi. No hydronephrosis. Multiple small calculi in the dependent urinary bladder. Thickening of the urinary bladder wall. Stomach/Bowel: Stomach is within normal limits. Appendix appears normal. Redemonstrated postoperative findings of rectosigmoid resection and reanastomosis with an interval increase in post treatment perirectal and presacral soft tissue thickening. Vascular/Lymphatic: Aortic atherosclerosis. No enlarged abdominal or pelvic lymph nodes. Reproductive: Prostatomegaly and median lobe hypertrophy. Other: No abdominal wall hernia or abnormality. No abdominopelvic ascites. Musculoskeletal: No acute or significant osseous findings. IMPRESSION: 1.  Redemonstrated postoperative findings of rectosigmoid resection and reanastomosis with an interval increase in post treatment perirectal and presacral soft tissue thickening. Findings are consistent with developing postoperative and post radiation change. No mass, nodularity, or lymphadenopathy appreciated. Attention on follow-up. 2. Status post cholecystectomy and partial right hepatectomy with resection of a previously noted mass about the gallbladder fossa. 3. Multiple stable small bilateral pulmonary nodules, likely benign and incidental. Continued attention on follow-up. 4. No evidence of new metastatic disease within the chest, abdomen or pelvis. 5. Unchanged splenomegaly. 6. Bilateral nonobstructive nephrolithiasis and bladder calculi. 7. Prostatomegaly and median lobe hypertrophy. Thickening of the urinary bladder wall, likely due to chronic outlet obstruction. 8. Coronary artery disease.  Aortic Atherosclerosis (ICD10-I70.0). Electronically Signed   By: Eddie Candle M.D.   On: 09/14/2019 14:53   CT Abdomen Pelvis W Contrast  Result Date: 09/14/2019 CLINICAL DATA:  Restaging metastatic colorectal cancer, status post resection, chemotherapy EXAM: CT CHEST, ABDOMEN, AND PELVIS WITH CONTRAST TECHNIQUE: Multidetector CT imaging of the chest, abdomen and pelvis was performed following the standard protocol during bolus administration of intravenous contrast. CONTRAST:  156mL OMNIPAQUE IOHEXOL 300 MG/ML SOLN, additional oral enteric contrast COMPARISON:  06/16/2019, 12/07/2018 FINDINGS: CT CHEST FINDINGS Cardiovascular: Left chest port catheter. Aortic valve calcifications. Normal heart size. Scattered coronary artery calcifications. No pericardial effusion. Mediastinum/Nodes: No enlarged mediastinal, hilar, or axillary lymph nodes. Thyroid gland, trachea, and esophagus demonstrate no significant findings. Lungs/Pleura: Multiple small bilateral pulmonary nodules are stable, for example 6 mm nodule of the  subpleural right lower lobe (series 3, image 100), a 2 mm nodule of the anterior right upper lobe (series 3, image 37) and a 4 mm nodule of the left lower lobe (series 3, image 107). No pleural effusion or pneumothorax. Musculoskeletal: No chest wall mass or suspicious bone lesions identified. CT ABDOMEN PELVIS FINDINGS Hepatobiliary: Status post cholecystectomy and partial right hepatectomy with resection of a previously noted mass  about the gallbladder fossa. No biliary dilatation. Pancreas: Unremarkable. No pancreatic ductal dilatation or surrounding inflammatory changes. Spleen: Unchanged splenomegaly, maximum coronal span 14.6 cm. Adrenals/Urinary Tract: Adrenal glands are unremarkable. Small bilateral nonobstructive renal calculi. No hydronephrosis. Multiple small calculi in the dependent urinary bladder. Thickening of the urinary bladder wall. Stomach/Bowel: Stomach is within normal limits. Appendix appears normal. Redemonstrated postoperative findings of rectosigmoid resection and reanastomosis with an interval increase in post treatment perirectal and presacral soft tissue thickening. Vascular/Lymphatic: Aortic atherosclerosis. No enlarged abdominal or pelvic lymph nodes. Reproductive: Prostatomegaly and median lobe hypertrophy. Other: No abdominal wall hernia or abnormality. No abdominopelvic ascites. Musculoskeletal: No acute or significant osseous findings. IMPRESSION: 1. Redemonstrated postoperative findings of rectosigmoid resection and reanastomosis with an interval increase in post treatment perirectal and presacral soft tissue thickening. Findings are consistent with developing postoperative and post radiation change. No mass, nodularity, or lymphadenopathy appreciated. Attention on follow-up. 2. Status post cholecystectomy and partial right hepatectomy with resection of a previously noted mass about the gallbladder fossa. 3. Multiple stable small bilateral pulmonary nodules, likely benign and  incidental. Continued attention on follow-up. 4. No evidence of new metastatic disease within the chest, abdomen or pelvis. 5. Unchanged splenomegaly. 6. Bilateral nonobstructive nephrolithiasis and bladder calculi. 7. Prostatomegaly and median lobe hypertrophy. Thickening of the urinary bladder wall, likely due to chronic outlet obstruction. 8. Coronary artery disease.  Aortic Atherosclerosis (ICD10-I70.0). Electronically Signed   By: Eddie Candle M.D.   On: 09/14/2019 14:53     ASSESSMENT:  1. 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 -Laparoscopic partial hepatectomy, redo low anterior resection on 02/01/2019, pathology showing YPT3Y PN 0. Positive circumferential/radial/mesenteric margin. -6 more cycles of FOLFOX completed on 05/31/2019. -Xeloda with radiation from 06/26/2019 through 08/03/2019. -CT CAP on 09/14/2019 shows postoperative changes in the rectosigmoid resection and reanastomosis with interval increase in posttreatment perirectal and presacral soft tissue thickening consistent with radiation changes.  No adenopathy.  Multiple stable small bilateral pulmonary nodules, likely benign.  No new evidence of metastatic disease.  Unchanged splenomegaly.  PLAN:  1. Metastatic sigmoid colon cancer to the liver: -He is doing fairly well.  Discussed the lab results which showed normal LFTs.  CBC is improving.  Latest CEA level is 2.1. -Reviewed scans which are within normal limits with no evidence of malignancy. -Recommended 88-month follow-up with repeat scans and labs including CEA level.  2. Diarrhea: -This has improved.  He will use Lomotil as needed.  3. Hypomagnesemia: -Continue magnesium twice daily.  4. 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  Recently evaluated by neurology.  5.  Gout: -Continue [place] 40 mg daily.  6.  CKD: -Creatinine stable around 1.3-1.4.   Orders placed this encounter:  No orders of the defined types were placed in this encounter.    Derek Jack, MD [place] [phone]   I, Milinda Antis, am acting as a scribe for Dr. Sanda Linger.  I, Derek Jack MD, have reviewed the above documentation for accuracy and completeness, and I agree with the above.

## 2019-10-17 ENCOUNTER — Other Ambulatory Visit (HOSPITAL_COMMUNITY): Payer: Self-pay

## 2019-10-17 DIAGNOSIS — C187 Malignant neoplasm of sigmoid colon: Secondary | ICD-10-CM

## 2019-10-17 MED ORDER — DONEPEZIL HCL 10 MG PO TABS: 10.0000 mg | ORAL_TABLET | Freq: Every day | ORAL | 0 refills | Status: DC

## 2019-12-05 ENCOUNTER — Other Ambulatory Visit (HOSPITAL_COMMUNITY): Payer: Self-pay

## 2019-12-05 ENCOUNTER — Encounter (HOSPITAL_COMMUNITY): Payer: Self-pay

## 2019-12-05 DIAGNOSIS — C187 Malignant neoplasm of sigmoid colon: Secondary | ICD-10-CM

## 2019-12-05 MED ORDER — DONEPEZIL HCL 10 MG PO TABS: 10.0000 mg | ORAL_TABLET | Freq: Every day | ORAL | 3 refills | Status: DC

## 2019-12-05 NOTE — Telephone Encounter (Signed)
Aricept refilled per Dr. Delton Coombes.

## 2019-12-21 ENCOUNTER — Inpatient Hospital Stay (HOSPITAL_COMMUNITY): Payer: 59 | Attending: Hematology

## 2019-12-21 ENCOUNTER — Other Ambulatory Visit: Payer: Self-pay

## 2019-12-21 ENCOUNTER — Other Ambulatory Visit (HOSPITAL_COMMUNITY): Payer: 59

## 2019-12-21 ENCOUNTER — Ambulatory Visit (HOSPITAL_COMMUNITY)
Admission: RE | Admit: 2019-12-21 | Discharge: 2019-12-21 | Disposition: A | Discharge status: Home / Self Care | Payer: 59 | Source: Ambulatory Visit | Attending: Hematology | Admitting: Hematology

## 2019-12-21 DIAGNOSIS — Z23 Encounter for immunization: Secondary | ICD-10-CM | POA: Insufficient documentation

## 2019-12-21 DIAGNOSIS — C187 Malignant neoplasm of sigmoid colon: Secondary | ICD-10-CM

## 2019-12-21 LAB — COMPREHENSIVE METABOLIC PANEL
ALT: 13 U/L (ref 0–44)
AST: 12 U/L — ABNORMAL LOW (ref 15–41)
Albumin: 3.7 g/dL (ref 3.5–5.0)
Alkaline Phosphatase: 67 U/L (ref 38–126)
Anion gap: 7 (ref 5–15)
BUN: 21 mg/dL (ref 8–23)
CO2: 25 mmol/L (ref 22–32)
Calcium: 9.3 mg/dL (ref 8.9–10.3)
Chloride: 108 mmol/L (ref 98–111)
Creatinine, Ser: 1.38 mg/dL — ABNORMAL HIGH (ref 0.61–1.24)
GFR, Estimated: 57 mL/min — ABNORMAL LOW (ref >60)
Glucose, Bld: 125 mg/dL — ABNORMAL HIGH (ref 70–99)
Potassium: 5 mmol/L (ref 3.5–5.1)
Sodium: 140 mmol/L (ref 135–145)
Total Bilirubin: 0.8 mg/dL (ref 0.3–1.2)
Total Protein: 6.3 g/dL — ABNORMAL LOW (ref 6.5–8.1)

## 2019-12-21 LAB — CBC WITH DIFFERENTIAL/PLATELET
Abs Immature Granulocytes: 0.03 10*3/uL (ref 0.00–0.07)
Basophils Absolute: 0 10*3/uL (ref 0.0–0.1)
Basophils Relative: 0 %
Eosinophils Absolute: 0.2 10*3/uL (ref 0.0–0.5)
Eosinophils Relative: 4 %
HCT: 32.3 % — ABNORMAL LOW (ref 39.0–52.0)
Hemoglobin: 10.2 g/dL — ABNORMAL LOW (ref 13.0–17.0)
Immature Granulocytes: 1 %
Lymphocytes Relative: 11 %
Lymphs Abs: 0.6 10*3/uL — ABNORMAL LOW (ref 0.7–4.0)
MCH: 29 pg (ref 26.0–34.0)
MCHC: 31.6 g/dL (ref 30.0–36.0)
MCV: 91.8 fL (ref 80.0–100.0)
Monocytes Absolute: 0.4 10*3/uL (ref 0.1–1.0)
Monocytes Relative: 8 %
Neutro Abs: 4.1 10*3/uL (ref 1.7–7.7)
Neutrophils Relative %: 76 %
Platelets: 135 10*3/uL — ABNORMAL LOW (ref 150–400)
RBC: 3.52 MIL/uL — ABNORMAL LOW (ref 4.22–5.81)
RDW: 13.3 % (ref 11.5–15.5)
WBC: 5.3 10*3/uL (ref 4.0–10.5)
nRBC: 0 % (ref 0.0–0.2)

## 2019-12-21 LAB — MAGNESIUM: Magnesium: 2 mg/dL (ref 1.7–2.4)

## 2019-12-21 MED ORDER — IOHEXOL 300 MG/ML  SOLN
100.0000 mL | Freq: Once | INTRAMUSCULAR | Status: AC | PRN
  Administered 2019-12-21: 100 mL via INTRAVENOUS

## 2019-12-22 LAB — CEA: CEA: 6.4 ng/mL — ABNORMAL HIGH (ref 0.0–4.7)

## 2019-12-25 ENCOUNTER — Inpatient Hospital Stay (HOSPITAL_BASED_OUTPATIENT_CLINIC_OR_DEPARTMENT_OTHER): Payer: 59 | Admitting: Hematology

## 2019-12-25 ENCOUNTER — Other Ambulatory Visit: Payer: Self-pay

## 2019-12-25 VITALS — BP 116/70 | HR 66 | Temp 97.6°F | Resp 19 | Wt 206.4 lb

## 2019-12-25 DIAGNOSIS — Z23 Encounter for immunization: Secondary | ICD-10-CM | POA: Diagnosis not present

## 2019-12-25 DIAGNOSIS — M7989 Other specified soft tissue disorders: Secondary | ICD-10-CM

## 2019-12-25 DIAGNOSIS — M25661 Stiffness of right knee, not elsewhere classified: Secondary | ICD-10-CM | POA: Diagnosis not present

## 2019-12-25 DIAGNOSIS — C187 Malignant neoplasm of sigmoid colon: Secondary | ICD-10-CM

## 2019-12-25 MED ORDER — SODIUM CHLORIDE 0.9% FLUSH
10.0000 mL | INTRAVENOUS | Status: DC | PRN
  Administered 2019-12-25: 10 mL via INTRAVENOUS

## 2019-12-25 MED ORDER — HEPARIN SOD (PORK) LOCK FLUSH 100 UNIT/ML IV SOLN
500.0000 [IU] | Freq: Once | INTRAVENOUS | Status: AC
  Administered 2019-12-25: 500 [IU] via INTRAVENOUS

## 2019-12-25 MED ORDER — ALLOPURINOL 300 MG PO TABS: 300.0000 mg | ORAL_TABLET | Freq: Every day | ORAL | 6 refills | Status: DC

## 2019-12-25 MED ORDER — INFLUENZA VAC SPLIT QUAD 0.5 ML IM SUSY
0.5000 mL | PREFILLED_SYRINGE | Freq: Once | INTRAMUSCULAR | Status: AC
  Administered 2019-12-25: 0.5 mL via INTRAMUSCULAR
  Filled 2019-12-25: qty 0.5

## 2019-12-25 MED ORDER — FUROSEMIDE 20 MG PO TABS
20.0000 mg | ORAL_TABLET | Freq: Every day | ORAL | 3 refills | Status: DC | PRN
Start: 2019-12-25 — End: 2020-04-29

## 2019-12-25 NOTE — Progress Notes (Signed)
Troy Smith presents today for injection per the provider's orders.  Flu shot administration without incident; injection site WNL; see MAR for injection details.  Patient tolerated procedure well and without incident.  No questions or complaints noted at this time.

## 2019-12-25 NOTE — Progress Notes (Signed)
Patient port flushed per MD orders.  Tolerated well without incident.  Patient discharged ambulatory and in stable condition.

## 2019-12-25 NOTE — Patient Instructions (Signed)
[place] at [facility] Discharge Instructions  You were seen today by Dr. Delton Coombes. He went over your recent results and scans. You will be prescribed Lasix 20 mg to take as needed for your leg swelling. Purchase compression socks or stockings to wear once your leg swelling comes down. You will be scheduled for a CT scan of your chest and abdomen before your next visit. Dr. Delton Coombes will see you back in 3 months for labs and follow up.   Thank you for choosing [place] at [facility] to provide your oncology and hematology care.  To afford each patient quality time with our provider, please arrive at least 15 minutes before your scheduled appointment time.   If you have a lab appointment with the [facility] please come in thru the Main Entrance and check in at the main information desk  You need to re-schedule your appointment should you arrive 10 or more minutes late.  We strive to give you quality time with our providers, and arriving late affects you and other patients whose appointments are after yours.  Also, if you no show three or more times for appointments you may be dismissed from the clinic at the providers discretion.     Again, thank you for choosing [facility].  Our hope is that these requests will decrease the amount of time that you wait before being seen by our physicians.       _____________________________________________________________  Should you have questions after your visit to [facility], please contact our office at [phone] between the hours of 8:00 a.m. and 4:30 p.m.  Voicemails left after 4:00 p.m. will not be returned until the following business day.  For prescription refill requests, have your pharmacy contact our office and allow 72 hours.    Cancer Center Support Programs:   > Cancer Support Group  2nd Tuesday of the month 1pm-2pm, Journey Room

## 2019-12-25 NOTE — Progress Notes (Signed)
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 FOLFOX x 12 cycles from 08/16/2018 to 05/31/2019. 2. Laparoscopic partial hepatectomy and LAR on 02/01/2019. 3. Xeloda and radiation therapy from 06/26/2019 to 08/03/2019.  NGS Results: Foundation 1 MS--stable, RAS wild-type  CURRENT THERAPY: Observation  BRIEF ONCOLOGIC HISTORY:  Oncology History  Adenocarcinoma of sigmoid colon ([place])  11/20/2015 Imaging   CT abd/pelvis- Focal sigmoid diverticulitis is noted without abscess formation. Focal narrowing and wall thickening of the sigmoid colon is noted in this area most likely due to inflammation, but neoplasm cannot be excluded, and sigmoidoscopy is recommended for further evaluation.   03/20/2016 Procedure   Colonoscopy by Dr. Laural Golden- Malignant partially obstructing tumor in the distal sigmoid colon. Biopsied. - Incomplete exam.   03/23/2016 Pathology Results   Colon, biopsy, sigmoid mass - ADENOCARCINOMA.   03/23/2016 Procedure   Low anterior resection with transanal anastomosis   03/26/2016 Pathology Results   Colon, segmental resection for tumor, sigmoid - INVASIVE WELL DIFFERENTIATED ADENOCARCINOMA, SPANNING 5 CM IN GREATEST DIMENSION. - TUMOR INVADES THROUGH MUSCULARIS PROPRIA TO INVOLVE SUBSEROSAL SOFT TISSUES. - UNDERLYING MARKED ACUTE AND CHRONIC INFLAMMATION WITH GIANT CELL REACTION AND ABSCESS FORMATION EXTENDING TO THE SEROSAL SURFACE AND INVOLVING THE MESENTERIC TISSUE. - MARGINS ARE NEGATIVE FOR TUMOR. - FOURTEEN BENIGN LYMPH NODES WHICH DEMONSTRATE VARIABLE INVOLVEMENT WITH ACUTE AND CHRONIC INFLAMMATION WITH ASSOCIATED GIANT CELLS WITHOUT TUMOR IDENTIFIED (0/14).   04/08/2016 Cancer Staging   Cancer Staging Adenocarcinoma  of sigmoid colon [facility]) Staging form: Colon and Rectum, AJCC 8th Edition - Pathologic stage from 04/01/2016: Stage IIA (pT3, pN0, cM0) - Signed by Baird Cancer, PA-C on 04/01/2016    07/09/2016 Imaging   CT abd/pelvis- No evidence of metastatic disease or other acute findings.  Bilateral nonobstructing renal calculi and mild bilateral renal parenchymal scarring.  Stable mildly enlarged prostate gland and findings of chronic bladder outlet obstruction.   08/16/2018 -  Chemotherapy   The patient had palonosetron (ALOXI) injection 0.25 mg, 0.25 mg, Intravenous,  Once, 12 of 12 cycles Administration: 0.25 mg (08/16/2018), 0.25 mg (09/07/2018), 0.25 mg (10/19/2018), 0.25 mg (11/02/2018), 0.25 mg (11/16/2018), 0.25 mg (11/30/2018), 0.25 mg (03/21/2019), 0.25 mg (04/04/2019), 0.25 mg (04/18/2019), 0.25 mg (05/02/2019), 0.25 mg (05/17/2019), 0.25 mg (05/31/2019) pegfilgrastim (NEULASTA) injection 6 mg, 6 mg, Subcutaneous, Once, 12 of 12 cycles Administration: 6 mg (08/18/2018), 6 mg (09/09/2018), 6 mg (10/21/2018), 6 mg (11/04/2018), 6 mg (11/18/2018), 6 mg (12/02/2018), 6 mg (03/23/2019), 6 mg (04/06/2019), 6 mg (04/20/2019), 6 mg (05/04/2019), 6 mg (05/19/2019), 6 mg (06/02/2019) irinotecan (CAMPTOSAR) 380 mg in sodium chloride 0.9 % 500 mL chemo infusion, 165 mg/m2 = 380 mg, Intravenous,  Once, 1 of 1 cycle Administration: 380 mg (08/16/2018) leucovorin 900 mg in dextrose 5 % 250 mL infusion, 400 mg/m2 = 900 mg, Intravenous,  Once, 12 of 12 cycles Administration: 900 mg (08/16/2018), 900 mg (09/07/2018), 900 mg (10/19/2018), 900 mg (11/02/2018), 900 mg (11/16/2018), 800 mg (11/30/2018), 900 mg (03/21/2019), 900 mg (04/04/2019), 900 mg (04/18/2019), 900 mg (05/02/2019), 900 mg (05/17/2019), 800 mg (05/31/2019) oxaliplatin (ELOXATIN) 190 mg in dextrose 5 % 500 mL chemo infusion, 85 mg/m2 = 190 mg, Intravenous,  Once, 12 of 12 cycles Dose modification: 68 mg/m2 (80 % of original dose 85  mg/m2, Cycle 2, Reason: Provider Judgment), 68 mg/m2 (80 % of  original dose 85 mg/m2, Cycle 3, Reason: Provider Judgment) Administration: 190 mg (08/16/2018), 150 mg (09/07/2018), 150 mg (10/19/2018), 150 mg (11/16/2018), 150 mg (11/02/2018), 135 mg (11/30/2018), 150 mg (03/21/2019), 150 mg (04/04/2019), 150 mg (04/18/2019), 150 mg (05/02/2019), 150 mg (05/17/2019), 130 mg (05/31/2019) fluorouracil (ADRUCIL) chemo injection 700 mg, 320 mg/m2 = 700 mg (100 % of original dose 320 mg/m2), Intravenous,  Once, 9 of 9 cycles Dose modification: 320 mg/m2 (original dose 320 mg/m2, Cycle 4) Administration: 700 mg (11/02/2018), 700 mg (11/16/2018), 650 mg (11/30/2018), 700 mg (03/21/2019), 700 mg (04/04/2019), 700 mg (04/18/2019), 700 mg (05/02/2019), 700 mg (05/17/2019), 700 mg (05/31/2019) fosaprepitant (EMEND) 150 mg, dexamethasone (DECADRON) 12 mg in sodium chloride 0.9 % 145 mL IVPB, , Intravenous,  Once, 12 of 12 cycles Administration:  (08/16/2018),  (09/07/2018),  (10/19/2018),  (11/02/2018),  (11/16/2018),  (11/30/2018),  (03/21/2019),  (04/04/2019),  (04/18/2019),  (05/02/2019),  (05/17/2019),  (05/31/2019) fluorouracil (ADRUCIL) 5,400 mg in sodium chloride 0.9 % 142 mL chemo infusion, 2,400 mg/m2 = 5,400 mg, Intravenous, 1 Day/Dose, 12 of 12 cycles Dose modification: 1,920 mg/m2 (80 % of original dose 2,400 mg/m2, Cycle 3, Reason: Provider Judgment) Administration: 5,400 mg (08/16/2018), 5,400 mg (09/07/2018), 4,300 mg (10/19/2018), 4,300 mg (11/02/2018), 4,300 mg (11/16/2018), 3,850 mg (11/30/2018), 4,300 mg (03/21/2019), 4,300 mg (04/04/2019), 4,300 mg (04/18/2019), 4,300 mg (05/02/2019), 4,300 mg (05/17/2019), 3,850 mg (05/31/2019) bevacizumab-awwb (MVASI) 500 mg in sodium chloride 0.9 % 100 mL chemo infusion, 5 mg/kg = 500 mg (100 % of original dose 5 mg/kg), Intravenous,  Once, 6 of 6 cycles Dose modification: 5 mg/kg (original dose 5 mg/kg, Cycle 1), 5 mg/kg (original dose 5 mg/kg, Cycle 2) Administration: 500 mg (08/16/2018), 500 mg (09/07/2018), 500 mg (10/19/2018), 500 mg (11/02/2018), 450 mg (11/16/2018), 400 mg  (11/30/2018)  for chemotherapy treatment.      CANCER STAGING: Cancer Staging Adenocarcinoma of sigmoid colon [facility]) Staging form: Colon and Rectum, AJCC 8th Edition - Pathologic stage from 04/01/2016: Stage IIA (pT3, pN0, cM0) - Signed by Baird Cancer, PA-C on 04/01/2016   INTERVAL HISTORY:  [name], a 64 y.o. 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 Keyshawn was last seen on 09/20/2019.   Today he is accompanied by his wife and he reports feeling well. He denies having any nosebleeds, hematochezia or hematuria. His numbness and tingling have resolved. His appetite is excellent and he denies having diarrhea. He reports that his feet are swollen. He denies having any urinary issues including weak stream or incomplete emptying. He denies having any recent infections, including pneumonia or GI infections. His energy levels are good. He reports getting rare gout exacerbations in both knees.  He is getting his flu shot today.  REVIEW OF SYSTEMS:  Review of Systems  Constitutional: Positive for fatigue (90%). Negative for appetite change.  HENT:   Negative for nosebleeds.   Cardiovascular: Negative for leg swelling.  Gastrointestinal: Negative for blood in stool and diarrhea.  Genitourinary: Negative for difficulty urinating and hematuria.   Skin: Positive for rash (bilat ankles).  All other systems reviewed and are negative.   PAST MEDICAL/SURGICAL HISTORY:  Past Medical History:  Diagnosis Date  . Adenocarcinoma of sigmoid colon ([place]) 03/23/2016  . Carpal tunnel syndrome 05/02/2014   Bilateral  . Cervical disc disorder with radiculopathy of cervical region 05/02/2014   Left C8  . Diabetes mellitus without complication ([place])   .  Hypertension   . Port-A-Cath in place 08/10/2018   Past Surgical History:  Procedure Laterality Date  . BIOPSY  03/20/2016   Procedure: BIOPSY;  Surgeon: Rogene Houston, MD;  Location[facility];   Service: Endoscopy;;  colon  . BOWEL RESECTION N/A 03/23/2016   Procedure: LOW ANTERIOR RESECTION;  Surgeon: Aviva Signs, MD;  Location: [facility] ORS;  Service: General;  Laterality: N/A;  . COLON SURGERY  01/2019   Done at [facility]  . COLONOSCOPY N/A 03/20/2016   Procedure: COLONOSCOPY;  Surgeon: Rogene Houston, MD;  Location: [facility] END[facility];  Service: Endoscopy;  Laterality: N/A;  730  . COLONOSCOPY N/A 07/10/2016   Procedure: COLONOSCOPY;  Surgeon: Rogene Houston, MD;  Location: [facility];  Service: Endoscopy;  Laterality: N/A;  1040  . NO PAST SURGERIES    . POLYPECTOMY  07/10/2016   Procedure: POLYPECTOMY;  Surgeon: Rogene Houston, MD;  Location: [facility];  Service: Endoscopy;;  colon  . PORTACATH PLACEMENT Left 08/05/2018   Procedure: INSERTION PORT-A-CATH;  Surgeon: Aviva Signs, MD;  Location: [facility] ORS;  Service: General;  Laterality: Left;  . TONSILLECTOMY      SOCIAL HISTORY:  Social History   Socioeconomic History  . Marital status: Married    Spouse name: Not on file  . Number of children: Not on file  . Years of education: Not on file  . Highest education level: Not on file  Occupational History  . Not on file  Tobacco Use  . Smoking status: Never Smoker  . Smokeless tobacco: Never Used  Vaping Use  . Vaping Use: Never used  Substance and Sexual Activity  . Alcohol use: Yes    Comment: rarely  . Drug use: No  . Sexual activity: Not on file  Other Topics Concern  . Not on file  Social History Narrative  . Not on file   Social Determinants of Health   Financial Resource Strain:   . Difficulty of Paying Living Expenses: Not on file  Food Insecurity:   . Worried About Charity fundraiser in the Last Year: Not on file  . Ran Out of Food in the Last Year: Not on file  Transportation Needs:   . Lack of Transportation (Medical): Not on file  . Lack of Transportation (Non-Medical): Not on file  Physical Activity:   . Days of Exercise per Week: Not on file    . Minutes of Exercise per Session: Not on file  Stress:   . Feeling of Stress : Not on file  Social Connections:   . Frequency of Communication with Friends and Family: Not on file  . Frequency of Social Gatherings with Friends and Family: Not on file  . Attends Religious Services: Not on file  . Active Member of Clubs or Organizations: Not on file  . Attends Archivist Meetings: Not on file  . Marital Status: Not on file  Intimate Partner Violence:   . Fear of Current or Ex-Partner: Not on file  . Emotionally Abused: Not on file  . Physically Abused: Not on file  . Sexually Abused: Not on file    FAMILY HISTORY:  Family History  Problem Relation Age of Onset  . COPD Mother   . Melanoma Father     CURRENT MEDICATIONS:  Current Outpatient Medications  Medication Sig Dispense Refill  . allopurinol (ZYLOPRIM) 300 MG tablet TAKE ONE TABLET BY MOUTH ONCE DAILY. 30 tablet 0  . amLODipine (  NORVASC) 10 MG tablet Take 10 mg by mouth daily.    . diphenoxylate-atropine (LOMOTIL) 2.5-0.025 MG tablet Take 1 tablet by mouth 4 (four) times daily as needed for diarrhea or loose stools. Take 2 tablets at the first onset of diarrhea followed by 1 tablet after every watery bowel movement. 60 tablet 2  . donepezil (ARICEPT) 10 MG tablet Take 1 tablet (10 mg total) by mouth at bedtime. 30 tablet 3  . febuxostat (ULORIC) 40 MG tablet Take 1 tablet (40 mg total) by mouth daily. 30 tablet 2  . insulin glargine (LANTUS) 100 UNIT/ML injection Inject 30 Units into the skin at bedtime.     Marland Kitchen loperamide (IMODIUM A-D) 2 MG tablet Take 2 at onset of diarrhea, then 1 after each watery bowel movement. 100 tablet 1  . magnesium oxide (MAG-OX) 400 (241.3 Mg) MG tablet Take 1 tablet (400 mg total) by mouth 2 (two) times daily. 60 tablet 2  . metoprolol succinate (TOPROL-XL) 50 MG 24 hr tablet Take 50 mg by mouth daily. Take with or immediately following a meal.    . scopolamine (TRANSDERM-SCOP) 1  MG/3DAYS Place 1 patch (1.5 mg total) onto the skin every 3 (three) days. 10 patch 12  . prochlorperazine (COMPAZINE) 10 MG tablet Take 1 tablet (10 mg total) by mouth every 6 (six) hours as needed (Nausea or vomiting). (Patient not taking: Reported on 12/25/2019) 60 tablet 1   No current facility-administered medications for this visit.    ALLERGIES:  Allergies  Allergen Reactions  . Penicillin G Nausea And Vomiting    PHYSICAL EXAM:  Performance status (ECOG): 1 - Symptomatic but completely ambulatory  Vitals:   12/25/19 1502 12/25/19 1510  BP:  116/70  Pulse: 66   Resp: 19   Temp: 97.6 F (36.4 C)   SpO2: 99%    Wt Readings from Last 3 Encounters:  12/25/19 206 lb 6.4 oz (93.6 kg)  09/20/19 183 lb 6.4 oz (83.2 kg)  08/28/19 189 lb (85.7 kg)   Physical Exam Vitals reviewed.  Constitutional:      Appearance: Normal appearance.  Cardiovascular:     Rate and Rhythm: Normal rate and regular rhythm.     Pulses: Normal pulses.     Heart sounds: Normal heart sounds.  Pulmonary:     Effort: Pulmonary effort is normal.     Breath sounds: Normal breath sounds.  Abdominal:     Palpations: Abdomen is soft. There is no hepatomegaly, splenomegaly or mass.     Tenderness: There is no abdominal tenderness.     Hernia: No hernia is present.  Musculoskeletal:     Right lower leg: No edema.     Left lower leg: No edema.  Neurological:     General: No focal deficit present.     Mental Status: He is alert and oriented to person, place, and time.  Psychiatric:        Mood and Affect: Mood normal.        Behavior: Behavior normal.      LABORATORY DATA:  I have reviewed the labs as listed.  CBC Latest Ref Rng & Units 12/21/2019 09/14/2019 08/17/2019  WBC 4.0 - 10.5 K/uL 5.3 4.9 4.2  Hemoglobin 13.0 - 17.0 g/dL 10.2(L) 10.5(L) 9.5(L)  Hematocrit 39 - 52 % 32.3(L) 32.4(L) 29.2(L)  Platelets 150 - 400 K/uL 135(L) 133(L) 144(L)   CMP Latest Ref Rng & Units 12/21/2019 09/14/2019  08/17/2019  Glucose 70 - 99 mg/dL 125(H) 130(H) 117(H)  BUN 8 - 23 mg/dL 21 24(H) 18  Creatinine 0.61 - 1.24 mg/dL 1.38(H) 1.48(H) 1.35(H)  Sodium 135 - 145 mmol/L 140 139 141  Potassium 3.5 - 5.1 mmol/L 5.0 3.8 3.9  Chloride 98 - 111 mmol/L 108 103 106  CO2 22 - 32 mmol/L 25 25 27   Calcium 8.9 - 10.3 mg/dL 9.3 9.7 9.0  Total Protein 6.5 - 8.1 g/dL 6.3(L) 6.6 6.0(L)  Total Bilirubin 0.3 - 1.2 mg/dL 0.8 1.1 1.0  Alkaline Phos 38 - 126 U/L 67 61 56  AST 15 - 41 U/L 12(L) 15 13(L)  ALT 0 - 44 U/L 13 14 15    Lab Results  Component Value Date   CEA1 6.4 (H) 12/21/2019   CEA1 2.1 09/14/2019   CEA1 1.6 07/18/2019    DIAGNOSTIC IMAGING:  I have independently reviewed the scans and discussed with the patient. CT CHEST ABDOMEN PELVIS W CONTRAST  Result Date: 12/21/2019 CLINICAL DATA:  Colorectal carcinoma status post sigmoid colon partial resection. Liver metastasis with partial hepatectomy. EXAM: CT CHEST, ABDOMEN, AND PELVIS WITH CONTRAST TECHNIQUE: Multidetector CT imaging of the chest, abdomen and pelvis was performed following the standard protocol during bolus administration of intravenous contrast. CONTRAST:  180mL OMNIPAQUE IOHEXOL 300 MG/ML  SOLN COMPARISON:  CT 11/14/2019, 10/07/2018, PET-CT 07/15/2018 FINDINGS: CT CHEST FINDINGS Cardiovascular: Port in the anterior chest wall with tip in distal SVC. No significant vascular findings. Normal heart size. No pericardial effusion. Mediastinum/Nodes: No axillary or supraclavicular adenopathy. No mediastinal or hilar adenopathy. No pericardial fluid. Esophagus normal. Lungs/Pleura: Peripheral RIGHT lower lobe pulmonary nodule measuring 4 mm unchanged from 5 mm 07/07/2018. Small RIGHT middle lobe nodule on image 35/series 4 is also unchanged. LEFT lower lobe nodule measuring 3 mm (image 44) also unchanged. No new pulmonary nodules Musculoskeletal: No aggressive osseous lesion. CT ABDOMEN PELVIS FINDINGS Hepatobiliary: Post central liver resection  and cholecystectomy. No enhancing hepatic lesion identified. Tiny subcapsular hypodense lesion in the posterior RIGHT hepatic lobe (image 60/2) is unchanged. Pancreas: No pancreatic lesion.  No duct dilatation. Spleen: Normal spleen. Adrenals/Urinary Tract: Adrenal glands normal. Bilateral nonobstructing renal calculi. There several dependent calculi within the bladder (approximately 6). The prostate gland indents the base the bladder. Bladder is distended to level of the umbilicus. Stomach/Bowel: Stomach, duodenum small-bowel normal. Post appendectomy. Ascending and transverse colon normal.  Descending colon normal. Anastomosis in the mid sigmoid colon. No evidence obstruction or nodularity. There is mild thickening through the rectum. Mild stranding in the perirectal fat which is similar to comparison exam. No nodularity. Vascular/Lymphatic: No pelvic lymphadenopathy. No retroperitoneal periaortic adenopathy. Minimal aortic calcification. No periportal adenopathy. Reproductive: Prostatomegaly. Other: No peritoneal metastasis Musculoskeletal: Degenerative changes of the spine. No aggressive osseous lesion. IMPRESSION: Chest Impression: 1. Stable bilateral small pulmonary nodules. 2. No mediastinal lymphadenopathy. 3. Coronary artery calcification and aortic atherosclerotic calcification. Abdomen / Pelvis Impression: 1. Post hepatic resection without evidence of hepatic recurrence. 2. Post partial sigmoid resection without evidence local recurrence. 3. Haziness in the perirectal fat similar prior.  No nodularity. 4. No pelvic or retroperitoneal lymphadenopathy. 5. Bladder calculi and enlarged prostate gland. Mildly distended bladder. Electronically Signed   By: Suzy Bouchard M.D.   On: 12/21/2019 16:56     ASSESSMENT:  1. 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 -Single copy UG T1 A1*28 allele indicating increased risk of neutropenia and  other side effects from Irinotecan. -Laparoscopic partial hepatectomy, redo low anterior resection on 02/01/2019,  pathology showing YPT3Y PN 0. Positive circumferential/radial/mesenteric margin. -6 more cycles of FOLFOX completed on 05/31/2019. -Xeloda with radiation from 06/26/2019 through 08/03/2019. -CT CAP on 09/14/2019 shows postoperative changes in the rectosigmoid resection and reanastomosis with interval increase in posttreatment perirectal and presacral soft tissue thickening consistent with radiation changes.  No adenopathy.  Multiple stable small bilateral pulmonary nodules, likely benign.  No new evidence of metastatic disease.  Unchanged splenomegaly.   PLAN:  1. Metastatic sigmoid colon cancer to the liver: -He is doing very well in terms of functional status. -CEA was elevated at 6.4.  LFTs are grossly normal. -Denies any recent infections.  Reviewed CT CAP from 12/21/2019 which did not show any evidence of recurrence or metastatic disease. -CEA elevation likely false positive.  Would recommend 75-month follow-up with repeat CEA and a scan.  2. Diarrhea: -This has improved.  Not requiring Lomotil.  3. Hypomagnesemia: -Continue magnesium twice daily.  4. 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  5. Gout: -Continue allopurinol 300 mg daily.  6.  CKD: -Creatinine stable around 1.3-1.4.  7.  Lower extremity swelling: -His albumin was normal.  He has bilateral lower extremity swelling.  We will start him on Lasix 20 mg daily as needed. -Recommended wearing compression stockings.   Orders placed this encounter:  No orders of the defined types were placed in this encounter.    Derek Jack, MD [place] [phone]   I, Milinda Antis, am acting as a scribe for Dr. Sanda Linger.  I, Derek Jack MD, have reviewed the above documentation for accuracy and completeness, and I agree with the above.

## 2020-02-19 ENCOUNTER — Encounter (HOSPITAL_COMMUNITY): Payer: Self-pay

## 2020-02-19 ENCOUNTER — Other Ambulatory Visit (HOSPITAL_COMMUNITY): Payer: Self-pay

## 2020-02-19 IMAGING — DX RIGHT MIDDLE FINGER 2+V
3 series · 3 of 3 positions shown · non-contrast
Comparison: None.

CLINICAL DATA: Third digit swelling without focal injury.

EXAM:
RIGHT MIDDLE FINGER 2+V

[finger ap]
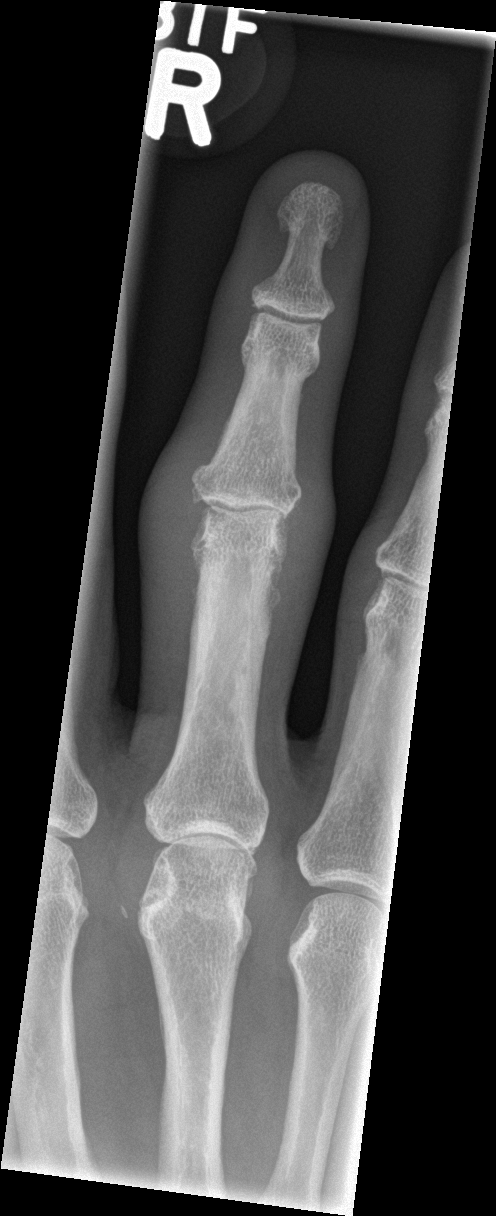

[finger obl]
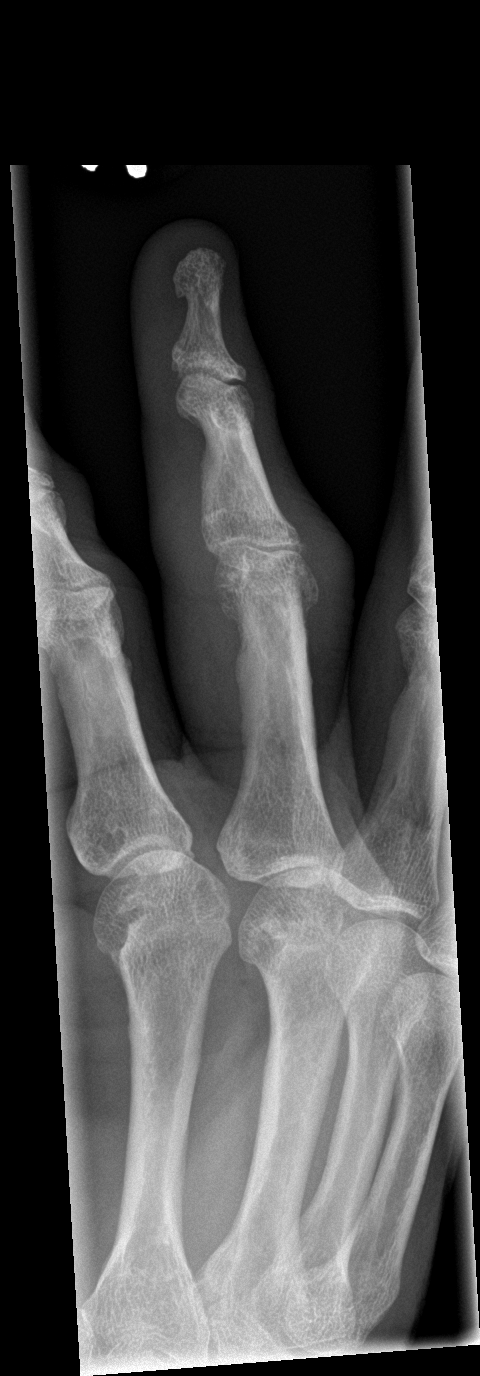

[finger lat]
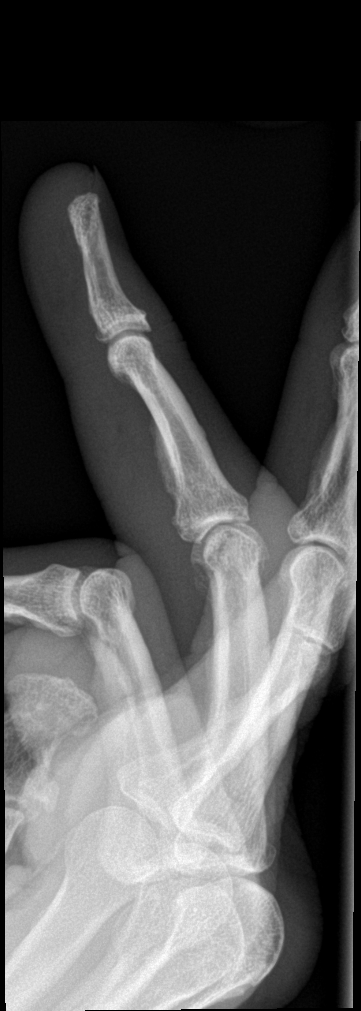

[3 of 3 positions shown; findings below may reference images not displayed]

FINDINGS: Soft tissue swelling is noted about the PIP joint. No bony erosive
changes are seen. No calcific tophi are noted. No other focal
abnormality is seen.
IMPRESSION: Soft tissue swelling without acute bony abnormality.

## 2020-02-19 IMAGING — DX RIGHT KNEE - COMPLETE 4+ VIEW
4 series · 4 of 4 positions shown · non-contrast
Comparison: None.

CLINICAL DATA: Right knee pain and swelling

EXAM:
RIGHT KNEE - COMPLETE 4+ VIEW

[knee ap (1 of 3)]
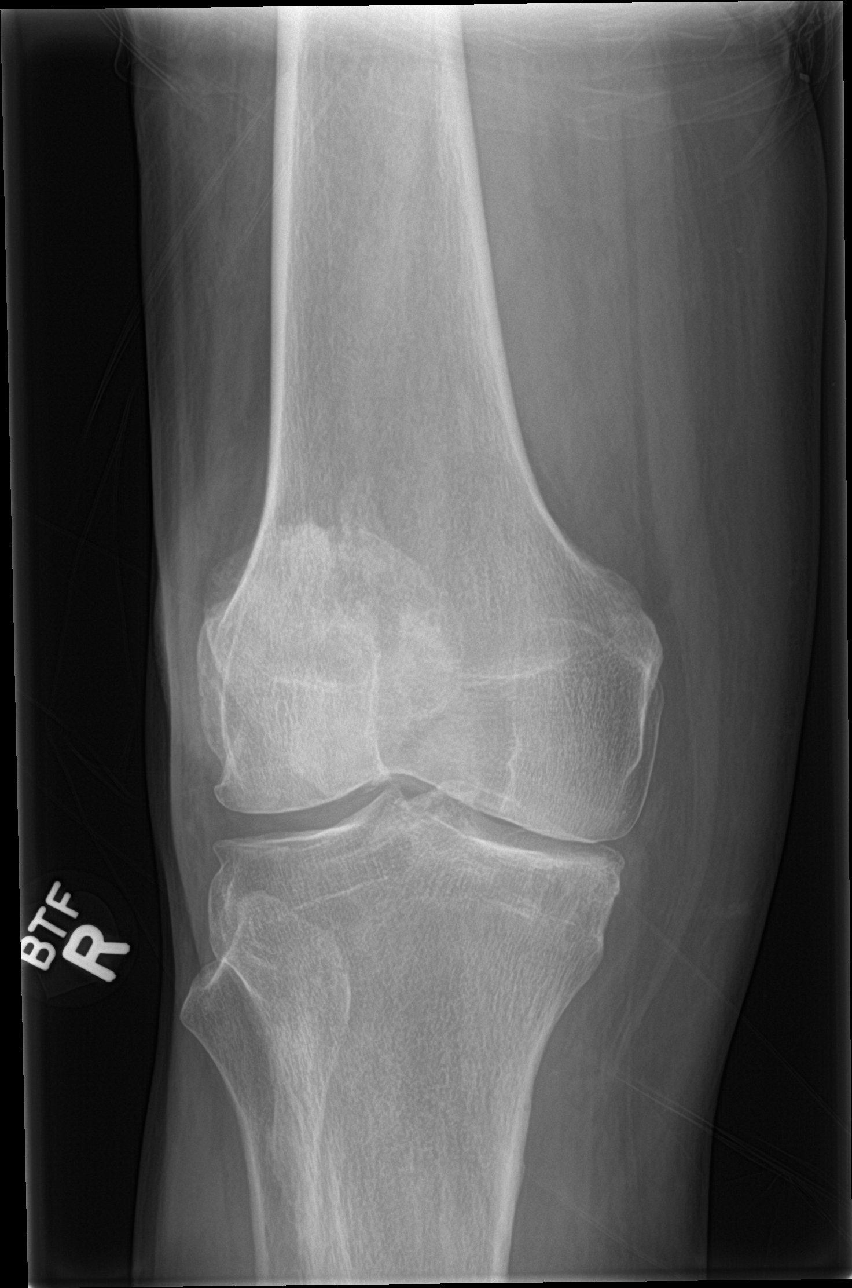

[knee ap (2 of 3)]
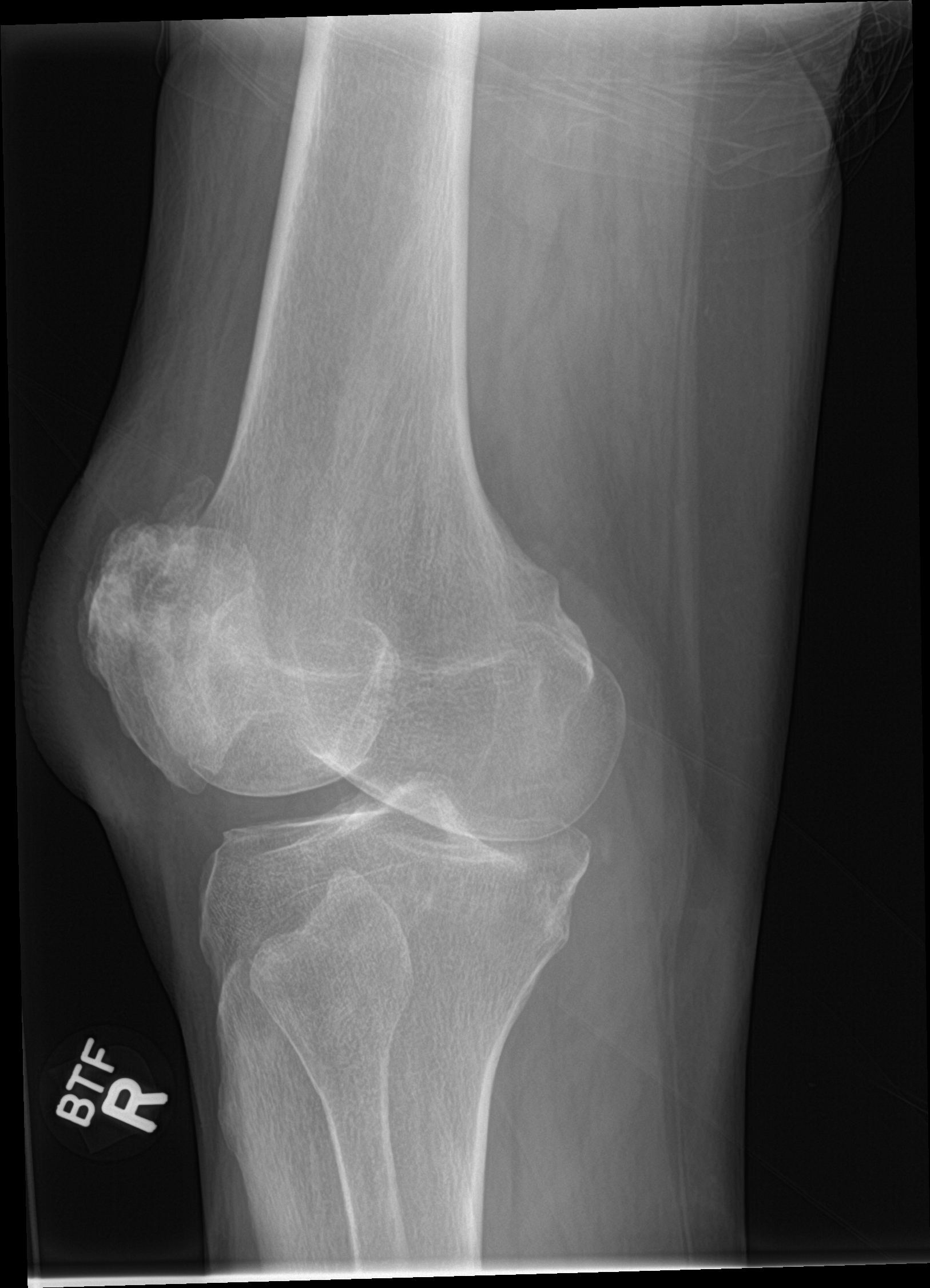

[knee ap (3 of 3)]
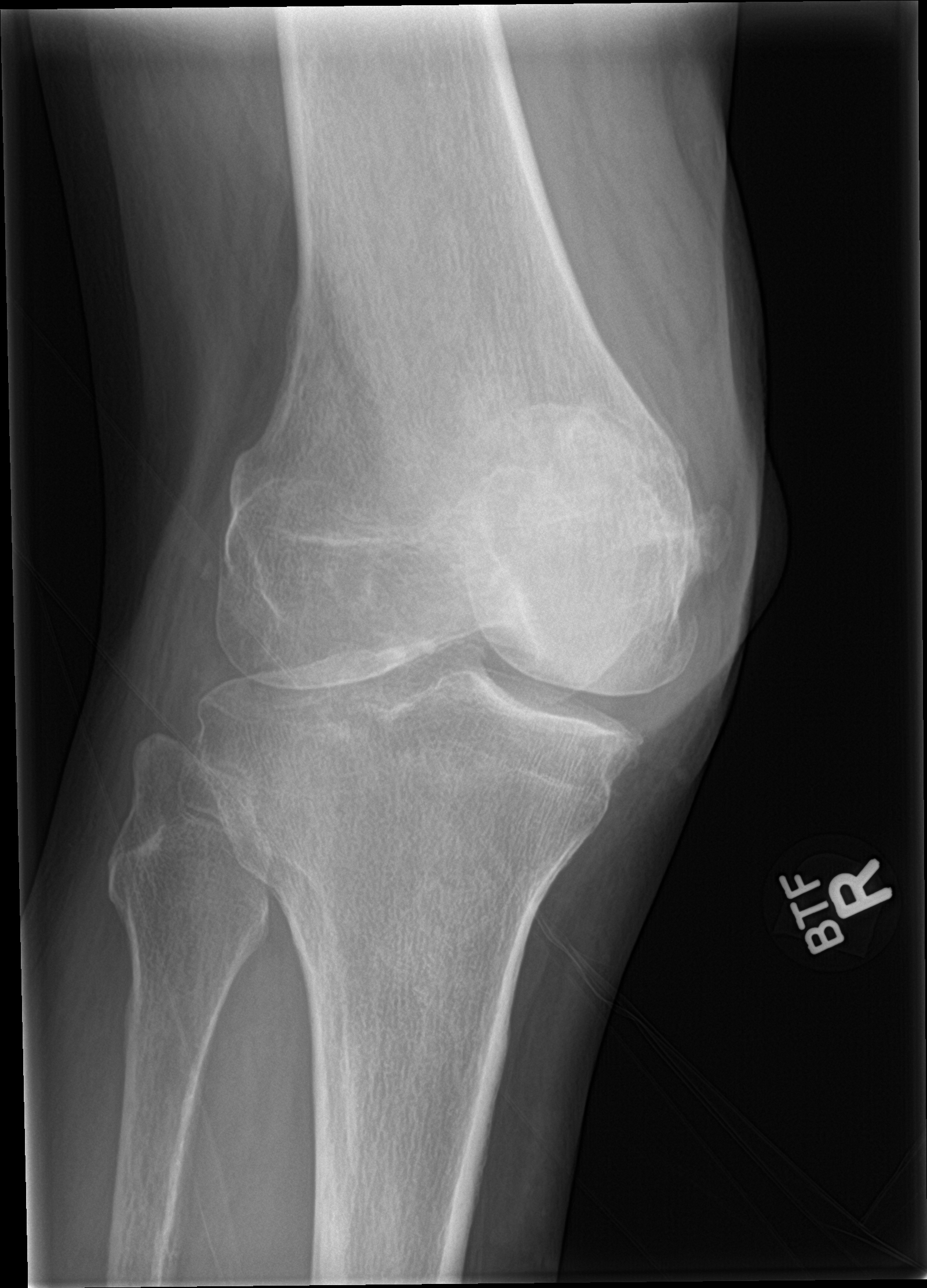

[knee lat]
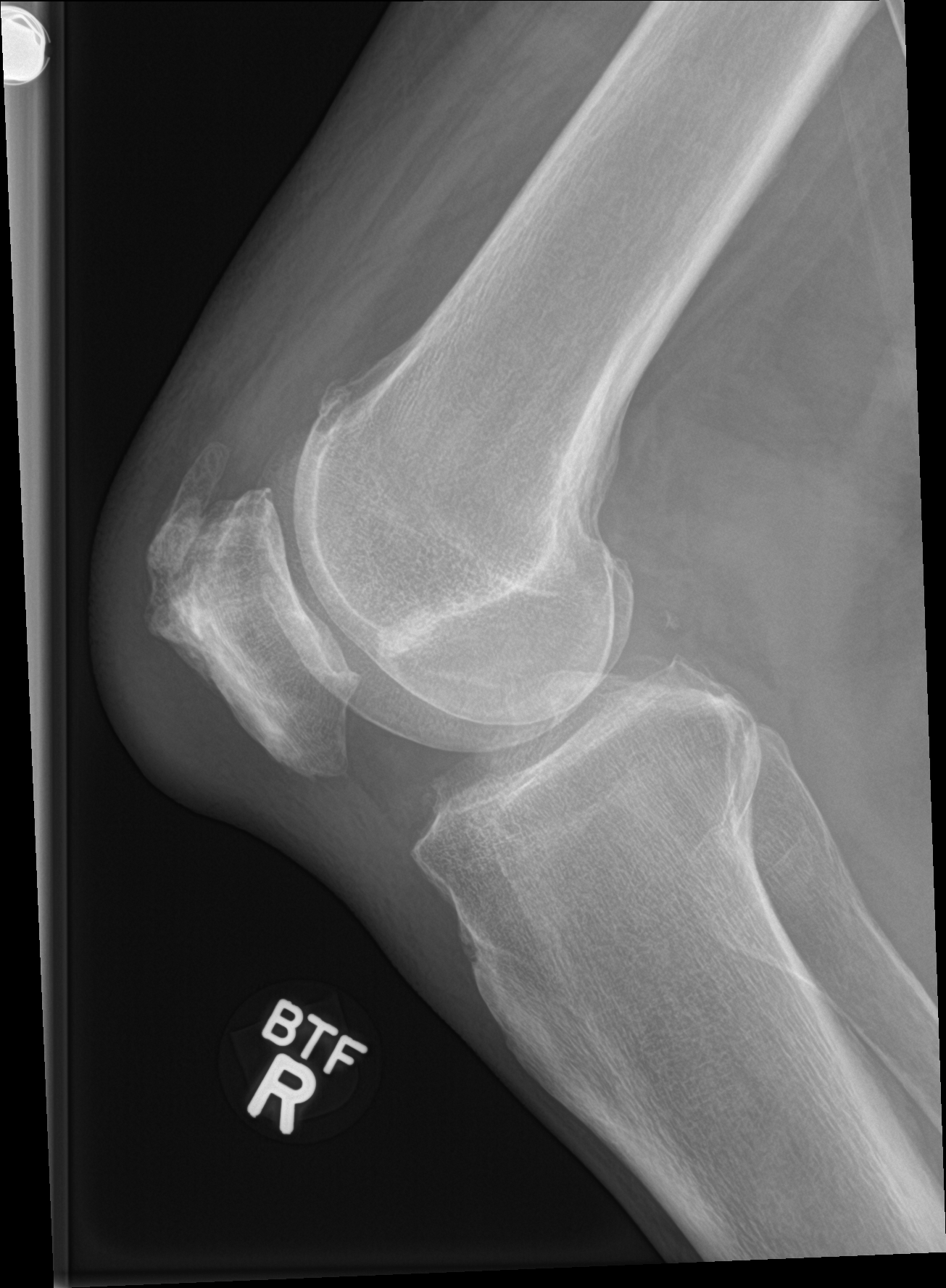

[4 of 4 positions shown; findings below may reference images not displayed]

FINDINGS: Mild medial joint space narrowing is noted. No acute fracture or
dislocation is seen. Patellofemoral degenerative changes are noted
as well. Soft tissue swelling is noted over the patella which may
represent some focal bursitis. No other focal abnormality is noted.
IMPRESSION: Soft tissue swelling over the anterior aspect of the patella which
may represent some focal bursitis.

## 2020-02-19 MED ORDER — DIPHENOXYLATE-ATROPINE 2.5-0.025 MG PO TABS: 1.0000 | ORAL_TABLET | Freq: Four times a day (QID) | ORAL | 2 refills | Status: DC | PRN

## 2020-02-21 ENCOUNTER — Other Ambulatory Visit (HOSPITAL_COMMUNITY): Payer: Self-pay | Admitting: *Deleted

## 2020-02-21 MED ORDER — DIPHENOXYLATE-ATROPINE 2.5-0.025 MG PO TABS: ORAL_TABLET | ORAL | 2 refills | Status: DC

## 2020-02-28 ENCOUNTER — Ambulatory Visit: Payer: 59 | Admitting: Neurology

## 2020-03-11 ENCOUNTER — Ambulatory Visit: Payer: 59

## 2020-03-11 ENCOUNTER — Other Ambulatory Visit: Payer: Self-pay

## 2020-03-11 ENCOUNTER — Ambulatory Visit: Payer: 59 | Admitting: Orthopedic Surgery

## 2020-03-11 VITALS — BP 105/73 | HR 73 | Ht 70.0 in | Wt 207.0 lb

## 2020-03-11 DIAGNOSIS — M25562 Pain in left knee: Secondary | ICD-10-CM

## 2020-03-11 DIAGNOSIS — G8929 Other chronic pain: Secondary | ICD-10-CM

## 2020-03-11 NOTE — Progress Notes (Signed)
Patient ID: Troy Smith, male   DOB: February 04, 1956, 65 y.o.   MRN: [id number]  Chief Complaint  Patient presents with  . Knee Pain    Left knee     65 yo male DM HTN, retired Pharmacist, community has some discomfort in his left knee noticed it was more swollen than the right knee and a little sore  Seems to have activity related pain nothing too severe.  Functional activities normal   Review of Systems  Constitutional: Negative for fever and malaise/fatigue.  Neurological: Positive for tingling.       Right hand may have some ulnar nerve symptoms    Past Medical History:  Diagnosis Date  . 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 Carpal tunnel syndrome 05/02/2014   Bilateral  . Cervical disc disorder with radiculopathy of cervical region 05/02/2014   Left C8  . Diabetes mellitus without complication ([place])   . Hypertension   . Port-A-Cath in place 08/10/2018    Past Surgical History:  Procedure Laterality Date  . BIOPSY  03/20/2016   Procedure: BIOPSY;  Surgeon: Rogene Houston, MD;  [facility];  Service: Endoscopy;;  colon  . BOWEL RESECTION N/A 03/23/2016   Procedure: LOW ANTERIOR RESECTION;  Surgeon: Aviva Signs, MD;  Location: [facility];  Service: General;  Laterality: N/A;  . COLON SURGERY  01/2019   Done at [facility]  . COLONOSCOPY N/A 03/20/2016   Procedure: COLONOSCOPY;  Surgeon: Rogene Houston, MD;  [facility];  Service: Endoscopy;  Laterality: N/A;  730  . COLONOSCOPY N/A 07/10/2016   Procedure: COLONOSCOPY;  Surgeon: Rogene Houston, MD;  [facility];  Service: Endoscopy;  Laterality: N/A;  1040  . NO PAST SURGERIES    . POLYPECTOMY  07/10/2016   Procedure: POLYPECTOMY;  Surgeon: Rogene Houston, MD;  [facility];  Service: Endoscopy;;  colon  . PORTACATH PLACEMENT Left 08/05/2018   Procedure: INSERTION PORT-A-CATH;  Surgeon: Aviva Signs, MD;  Location: [facility];  Service: General;  Laterality: Left;  . TONSILLECTOMY       PHYSICAL EXAM  BP 105/73   Pulse 73   Ht 5\' 10"  (1.778 m)   Wt 207 lb (93.9 kg)   BMI 29.70 kg/m  GENERAL appearance reveals no gross abnormalities, normal development grooming and hygiene   MENTAL STATUS we note that the patient is awake alert and oriented to person place and time MOOD/AFFECT ARE NORMAL   GAIT reveals no limp in the effected limb  Right Knee Exam   Muscle Strength  The patient has normal right knee strength.  Tenderness  The patient is experiencing no tenderness.   Range of Motion  Extension: normal  Flexion: normal   Tests  Lachman:  Anterior - negative    Posterior - negative  Other  Erythema: absent Scars: absent Sensation: normal Pulse: present Swelling: none Effusion: no effusion present   Left Knee Exam   Muscle Strength  The patient has normal left knee strength.  Tenderness  The patient is experiencing tenderness in the medial joint line.  Range of Motion  Extension: abnormal  Flexion: normal   Tests  Lachman:  Anterior - negative    Posterior - negative  Other  Erythema: absent Scars: absent Sensation: normal Pulse: present Effusion: effusion present     VASC 2+ dorsalis pedis pulse normal capillary refill excellent warmth to the extremity  NEURO normal sensation and no pathologic reflexes  LYMPH  deferred noncontributory  MEDICAL DECISION MAKING  A.  Encounter Diagnosis  Name Primary?  . Chronic pain of left knee Yes    B. DATA ANALYSED:    IMAGING: Independent interpretation of images: Internal images show some both knees but mild  Orders: no  Outside records reviewed: none  C. MANAGEMENT  Aspiration injection left knee  Procedure note injection and aspiration left knee joint  Verbal consent was obtained to aspirate and inject the left knee joint   Timeout was completed to confirm the site of aspiration and injection  An 18-gauge needle was used to aspirate the left knee joint from a  suprapatellar lateral approach.  The medications used were 40 mg of Depo-Medrol and 1% lidocaine 3 cc  Anesthesia was provided by ethyl chloride and the skin was prepped with alcohol.  After cleaning the skin with alcohol an 18-gauge needle was used to aspirate the right knee joint.  We obtained 18 cc of fluid clear  We followed this by injection of 40 mg of Depo-Medrol and 3 cc 1% lidocaine.  There were no complications. A sterile bandage was applied.   Activity modification  Continue to maintain a good weight  Continue exercise  Come back if symptoms warrant  No orders of the defined types were placed in this encounter.

## 2020-03-11 NOTE — Patient Instructions (Signed)
Joint Steroid Injection A joint steroid injection is a procedure to relieve swelling and pain in a joint. Steroids are medicines that reduce inflammation. In this procedure, your health care provider uses a syringe and a needle to inject a steroid medicine into a painful and inflamed joint. A pain-relieving medicine (anesthetic) may be injected along with the steroid. In some cases, your health care provider may use an imaging technique such as ultrasound or fluoroscopy to guide the injection. Joints that are often treated with steroid injections include the knee, shoulder, hip, and spine. These injections may also be used in the elbow, ankle, and joints of the hands or feet. You may have joint steroid injections as part of your treatment for inflammation caused by:  Gout.  Rheumatoid arthritis.  Advanced wear-and-tear arthritis (osteoarthritis).  Tendinitis.  Bursitis. Joint steroid injections may be repeated, but having them too often can damage a joint or the skin over the joint. You should not have joint steroid injections less than 6 weeks apart or more than four times a year. Tell a health care provider about:  Any allergies you have.  All medicines you are taking, including vitamins, herbs, eye drops, creams, and over-the-counter medicines.  Any problems you or family members have had with anesthetic medicines.  Any blood disorders you have.  Any surgeries you have had.  Any medical conditions you have.  Whether you are pregnant or may be pregnant. What are the risks? Generally, this is a safe treatment. However, problems may occur, including:  Infection.  Bleeding.  Allergic reactions to medicines.  Damage to the joint or tissues around the joint.  Thinning of skin or loss of skin color over the joint.  Temporary flushing of the face or chest.  Temporary increase in pain.  Temporary increase in blood sugar.  Failure to relieve inflammation or pain. What  happens before the treatment? Medicines Ask your health care provider about:  Changing or stopping your regular medicines. This is especially important if you are taking diabetes medicines or blood thinners.  Taking medicines such as aspirin and ibuprofen. These medicines can thin your blood. Do not take these medicines unless your health care provider tells you to take them.  Taking over-the-counter medicines, vitamins, herbs, and supplements. General instructions  You may have imaging tests of your joint.  Ask your health care provider if you can drive yourself home after the procedure. What happens during the treatment?  Your health care provider will position you for the injection and locate the injection site over your joint.  The skin over the joint will be cleaned with a germ-killing soap.  Your health care provider may: ? Spray a numbing solution (topical anesthetic) over the injection site. ? Inject a local anesthetic under the skin above your joint.  The needle will be placed through your skin into your joint. Your health care provider may use imaging to guide the needle to the right spot for the injection. If imaging is used, a special contrast dye may be injected to confirm that the needle is in the correct location.  The steroid medicine will be injected into your joint.  Anesthetic may be injected along with the steroid. This may be a medicine that relieves pain for a short time (short-acting anesthetic) or for a longer time (long-acting anesthetic).  The needle will be removed, and an adhesive bandage (dressing) will be placed over the injection site. The procedure may vary among health care providers and hospitals.     What can I expect after the treatment?  You will be able to go home after the treatment.  It is normal to feel slight flushing for a few days after the injection.  After the treatment, it is common to have an increase in joint pain after the  anesthetic has worn off. This may happen about an hour after a short-acting anesthetic or about 8 hours after a longer-acting anesthetic.  You should begin to feel relief from joint pain and swelling after 24 to 48 hours. Contact your health care provider if you do not begin to feel relief after 2 days. Follow these instructions at home: Injection site care  Leave the adhesive dressing over your injection site in place until your health care provider says you can remove it.  Check your injection site every day for signs of infection. Check for: ? More redness, swelling, or pain. ? Fluid or blood. ? Warmth. ? Pus or a bad smell. Activity  Return to your normal activities as told by your health care provider. Ask your health care provider what activities are safe for you. You may be asked to limit activities that put stress on the joint for a few days.  Do joint exercises as told by your health care provider.  Do not take baths, swim, or use a hot tub until your health care provider approves. Ask your health care provider if you may take showers. You may only be allowed to take sponge baths. Managing pain, stiffness, and swelling  If directed, put ice on the joint. To do this: ? Put ice in a plastic bag. ? Place a towel between your skin and the bag. ? Leave the ice on for 20 minutes, 2-3 times a day. ? Remove the ice if your skin turns bright red. This is very important. If you cannot feel pain, heat, or cold, you have a greater risk of damage to the area.  Raise (elevate) your joint above the level of your heart when you are sitting or lying down.   General instructions  Take over-the-counter and prescription medicines only as told by your health care provider.  Do not use any products that contain nicotine or tobacco, such as cigarettes, e-cigarettes, and chewing tobacco. These can delay joint healing. If you need help quitting, ask your health care provider.  If you have  diabetes, be aware that your blood sugar may be slightly elevated for several days after the injection.  Keep all follow-up visits. This is important. Contact a health care provider if you have:  Chills or a fever.  Any signs of infection at your injection site.  Increased pain or swelling or no relief after 2 days. Summary  A joint steroid injection is a treatment to relieve pain and swelling in a joint.  Steroids are medicines that reduce inflammation. Your health care provider may add an anesthetic along with the steroid.  You may have joint steroid injections as part of your arthritis treatment.  Joint steroid injections may be repeated, but having them too often can damage a joint or the skin over the joint.  Contact your health care provider if you have a fever, chills, or signs of infection, or if you get no relief from joint pain or swelling. This information is not intended to replace advice given to you by your health care provider. Make sure you discuss any questions you have with your health care provider. Document Revised: 07/07/2019 Document Reviewed: 07/07/2019 Elsevier Patient Education  [place].  Osteoarthritis  Osteoarthritis is a type of arthritis. It refers to joint pain or joint disease. Osteoarthritis affects tissue that covers the ends of bones in joints (cartilage). Cartilage acts as a cushion between the bones and helps them move smoothly. Osteoarthritis occurs when cartilage in the joints gets worn down. Osteoarthritis is sometimes called "wear and tear" arthritis. Osteoarthritis is the most common form of arthritis. It often occurs in older people. It is a condition that gets worse over time. The joints most often affected by this condition are in the fingers, toes, hips, knees, and spine, including the neck and lower back. What are the causes? This condition is caused by the wearing down of cartilage that covers the ends of bones. What increases  the risk? The following factors may make you more likely to develop this condition:  Being age 56 or older.  Obesity.  Overuse of joints.  Past injury of a joint.  Past surgery on a joint.  Family history of osteoarthritis. What are the signs or symptoms? The main symptoms of this condition are pain, swelling, and stiffness in the joint. Other symptoms may include:  An enlarged joint.  More pain and further damage caused by small pieces of bone or cartilage that break off and float inside of the joint.  Small deposits of bone (osteophytes) that grow on the edges of the joint.  A grating or scraping feeling inside the joint when you move it.  Popping or creaking sounds when you move.  Difficulty walking or exercising.  An inability to grip items, twist your hand(s), or control the movements of your hands and fingers. How is this diagnosed? This condition may be diagnosed based on:  Your medical history.  A physical exam.  Your symptoms.  X-rays of the affected joint(s).  Blood tests to rule out other types of arthritis. How is this treated? There is no cure for this condition, but treatment can help control pain and improve joint function. Treatment may include a combination of therapies, such as:  Pain relief techniques, such as: ? Applying heat and cold to the joint. ? Massage. ? A form of talk therapy called cognitive behavioral therapy (CBT). This therapy helps you set goals and follow up on the changes that you make.  Medicines for pain and inflammation. The medicines can be taken by mouth or applied to the skin. They include: ? NSAIDs, such as ibuprofen. ? Prescription medicines. ? Strong anti-inflammatory medicines (corticosteroids). ? Certain nutritional supplements.  A prescribed exercise program. You may work with a physical therapist.  Assistive devices, such as a brace, wrap, splint, specialized glove, or cane.  A weight control plan.  Surgery,  such as: ? An osteotomy. This is done to reposition the bones and relieve pain or to remove loose pieces of bone and cartilage. ? Joint replacement surgery. You may need this surgery if you have advanced osteoarthritis. Follow these instructions at home: Activity  Rest your affected joints as told by your health care provider.  Exercise as told by your health care provider. He or she may recommend specific types of exercise, such as: ? Strengthening exercises. These are done to strengthen the muscles that support joints affected by arthritis. ? Aerobic activities. These are exercises, such as brisk walking or water aerobics, that increase your heart rate. ? Range-of-motion activities. These help your joints move more easily. ? Balance and agility exercises. Managing pain, stiffness, and swelling  If directed, apply heat to  the affected area as often as told by your health care provider. Use the heat source that your health care provider recommends, such as a moist heat pack or a heating pad. ? If you have a removable assistive device, remove it as told by your health care provider. ? Place a towel between your skin and the heat source. If your health care provider tells you to keep the assistive device on while you apply heat, place a towel between the assistive device and the heat source. ? Leave the heat on for 20-30 minutes. ? Remove the heat if your skin turns bright red. This is especially important if you are unable to feel pain, heat, or cold. You may have a greater risk of getting burned.  If directed, put ice on the affected area. To do this: ? If you have a removable assistive device, remove it as told by your health care provider. ? Put ice in a plastic bag. ? Place a towel between your skin and the bag. If your health care provider tells you to keep the assistive device on during icing, place a towel between the assistive device and the bag. ? Leave the ice on for 20 minutes, 2-3  times a day. ? Move your fingers or toes often to reduce stiffness and swelling. ? Raise (elevate) the injured area above the level of your heart while you are sitting or lying down.      General instructions  Take over-the-counter and prescription medicines only as told by your health care provider.  Maintain a healthy weight. Follow instructions from your health care provider for weight control.  Do not use any products that contain nicotine or tobacco, such as cigarettes, e-cigarettes, and chewing tobacco. If you need help quitting, ask your health care provider.  Use assistive devices as told by your health care provider.  Keep all follow-up visits as told by your health care provider. This is important. Where to find more information  [employer] of Arthritis and Musculoskeletal and Skin Diseases: www.niams.SouthExposed.es  [employer] on Aging: http://kim-miller.com/  American College of Rheumatology: www.rheumatology.org Contact a health care provider if:  You have redness, swelling, or a feeling of warmth in a joint that gets worse.  You have a fever along with joint or muscle aches.  You develop a rash.  You have trouble doing your normal activities. Get help right away if:  You have pain that gets worse and is not relieved by pain medicine. Summary  Osteoarthritis is a type of arthritis that affects tissue covering the ends of bones in joints (cartilage).  This condition is caused by the wearing down of cartilage that covers the ends of bones.  The main symptom of this condition is pain, swelling, and stiffness in the joint.  There is no cure for this condition, but treatment can help control pain and improve joint function. This information is not intended to replace advice given to you by your health care provider. Make sure you discuss any questions you have with your health care provider. Document Revised: 01/23/2019 Document Reviewed: 01/23/2019 Elsevier  Patient Education  2021 [employer].

## 2020-03-13 ENCOUNTER — Ambulatory Visit: Payer: 59 | Admitting: Neurology

## 2020-03-20 ENCOUNTER — Inpatient Hospital Stay (HOSPITAL_COMMUNITY): Payer: 59 | Attending: Hematology

## 2020-03-20 ENCOUNTER — Other Ambulatory Visit: Payer: Self-pay

## 2020-03-20 DIAGNOSIS — D649 Anemia, unspecified: Secondary | ICD-10-CM | POA: Diagnosis not present

## 2020-03-20 DIAGNOSIS — R197 Diarrhea, unspecified: Secondary | ICD-10-CM | POA: Insufficient documentation

## 2020-03-20 DIAGNOSIS — Z79899 Other long term (current) drug therapy: Secondary | ICD-10-CM | POA: Diagnosis not present

## 2020-03-20 DIAGNOSIS — E1122 Type 2 diabetes mellitus with diabetic chronic kidney disease: Secondary | ICD-10-CM | POA: Insufficient documentation

## 2020-03-20 DIAGNOSIS — I129 Hypertensive chronic kidney disease with stage 1 through stage 4 chronic kidney disease, or unspecified chronic kidney disease: Secondary | ICD-10-CM | POA: Insufficient documentation

## 2020-03-20 DIAGNOSIS — N189 Chronic kidney disease, unspecified: Secondary | ICD-10-CM | POA: Diagnosis not present

## 2020-03-20 DIAGNOSIS — C187 Malignant neoplasm of sigmoid colon: Secondary | ICD-10-CM | POA: Diagnosis not present

## 2020-03-20 DIAGNOSIS — Z794 Long term (current) use of insulin: Secondary | ICD-10-CM | POA: Insufficient documentation

## 2020-03-20 DIAGNOSIS — C787 Secondary malignant neoplasm of liver and intrahepatic bile duct: Secondary | ICD-10-CM | POA: Insufficient documentation

## 2020-03-20 LAB — COMPREHENSIVE METABOLIC PANEL
ALT: 12 U/L (ref 0–44)
AST: 14 U/L — ABNORMAL LOW (ref 15–41)
Albumin: 3.7 g/dL (ref 3.5–5.0)
Alkaline Phosphatase: 87 U/L (ref 38–126)
Anion gap: 7 (ref 5–15)
BUN: 20 mg/dL (ref 8–23)
CO2: 24 mmol/L (ref 22–32)
Calcium: 9.2 mg/dL (ref 8.9–10.3)
Chloride: 105 mmol/L (ref 98–111)
Creatinine, Ser: 1.24 mg/dL (ref 0.61–1.24)
GFR, Estimated: >60 mL/min (ref >60)
Glucose, Bld: 152 mg/dL — ABNORMAL HIGH (ref 70–99)
Potassium: 3.5 mmol/L (ref 3.5–5.1)
Sodium: 136 mmol/L (ref 135–145)
Total Bilirubin: 0.9 mg/dL (ref 0.3–1.2)
Total Protein: 6.6 g/dL (ref 6.5–8.1)

## 2020-03-20 LAB — CBC WITH DIFFERENTIAL/PLATELET
Abs Immature Granulocytes: 0.03 10*3/uL (ref 0.00–0.07)
Basophils Absolute: 0 10*3/uL (ref 0.0–0.1)
Basophils Relative: 0 %
Eosinophils Absolute: 0.1 10*3/uL (ref 0.0–0.5)
Eosinophils Relative: 3 %
HCT: 33.7 % — ABNORMAL LOW (ref 39.0–52.0)
Hemoglobin: 10.9 g/dL — ABNORMAL LOW (ref 13.0–17.0)
Immature Granulocytes: 1 %
Lymphocytes Relative: 15 %
Lymphs Abs: 0.6 10*3/uL — ABNORMAL LOW (ref 0.7–4.0)
MCH: 28.9 pg (ref 26.0–34.0)
MCHC: 32.3 g/dL (ref 30.0–36.0)
MCV: 89.4 fL (ref 80.0–100.0)
Monocytes Absolute: 0.3 10*3/uL (ref 0.1–1.0)
Monocytes Relative: 7 %
Neutro Abs: 3.2 10*3/uL (ref 1.7–7.7)
Neutrophils Relative %: 74 %
Platelets: 146 10*3/uL — ABNORMAL LOW (ref 150–400)
RBC: 3.77 MIL/uL — ABNORMAL LOW (ref 4.22–5.81)
RDW: 13.9 % (ref 11.5–15.5)
WBC: 4.3 10*3/uL (ref 4.0–10.5)
nRBC: 0 % (ref 0.0–0.2)

## 2020-03-20 LAB — MAGNESIUM: Magnesium: 2 mg/dL (ref 1.7–2.4)

## 2020-03-20 MED ORDER — SODIUM CHLORIDE 0.9% FLUSH
10.0000 mL | INTRAVENOUS | Status: DC | PRN
  Administered 2020-03-20: 10 mL via INTRAVENOUS

## 2020-03-20 MED ORDER — HEPARIN SOD (PORK) LOCK FLUSH 100 UNIT/ML IV SOLN
500.0000 [IU] | Freq: Once | INTRAVENOUS | Status: AC
  Administered 2020-03-20: 500 [IU] via INTRAVENOUS

## 2020-03-20 NOTE — Progress Notes (Signed)
Troy Smith presented for Portacath access and flush.  Portacath located left chest wall accessed with  H 20 needle.  Good blood return present. Portacath flushed with 1ml NS and 500U/39ml Heparin and needle removed intact.  Procedure tolerated well and without incident.  Discharged in stable condition ambulatory.

## 2020-03-20 NOTE — Patient Instructions (Signed)
[facility] at [facility]  Discharge Instructions:   _______________________________________________________________  Thank you for choosing [facility] at [facility] to provide your oncology and hematology care.  To afford each patient quality time with our providers, please arrive at least 15 minutes before your scheduled appointment.  You need to re-schedule your appointment if you arrive 10 or more minutes late.  We strive to give you quality time with our providers, and arriving late affects you and other patients whose appointments are after yours.  Also, if you no show three or more times for appointments you may be dismissed from the clinic.  Again, thank you for choosing [facility] at [facility]. Our hope is that these requests will allow you access to exceptional care and in a timely manner. _______________________________________________________________  If you have questions after your visit, please contact our office at [phone] between the hours of 8:30 a.m. and 5:00 p.m. Voicemails left after 4:30 p.m. will not be returned until the following business day. _______________________________________________________________  For prescription refill requests, have your pharmacy contact our office. _______________________________________________________________  Recommendations made by the consultant and any test results will be sent to your referring physician. _______________________________________________________________ 

## 2020-03-21 ENCOUNTER — Ambulatory Visit (HOSPITAL_COMMUNITY)
Admission: RE | Admit: 2020-03-21 | Discharge: 2020-03-21 | Disposition: A | Discharge status: Home / Self Care | Payer: 59 | Source: Ambulatory Visit | Attending: Hematology | Admitting: Hematology

## 2020-03-21 DIAGNOSIS — C187 Malignant neoplasm of sigmoid colon: Secondary | ICD-10-CM | POA: Insufficient documentation

## 2020-03-21 LAB — CEA: CEA: 29.6 ng/mL — ABNORMAL HIGH (ref 0.0–4.7)

## 2020-03-21 MED ORDER — IOHEXOL 300 MG/ML  SOLN
100.0000 mL | Freq: Once | INTRAMUSCULAR | Status: AC | PRN
  Administered 2020-03-21: 100 mL via INTRAVENOUS

## 2020-03-22 ENCOUNTER — Other Ambulatory Visit (HOSPITAL_COMMUNITY): Payer: 59

## 2020-03-26 ENCOUNTER — Encounter: Payer: Self-pay | Admitting: Neurology

## 2020-03-26 ENCOUNTER — Ambulatory Visit: Payer: 59 | Admitting: Neurology

## 2020-03-26 DIAGNOSIS — F09 Unspecified mental disorder due to known physiological condition: Secondary | ICD-10-CM

## 2020-03-26 DIAGNOSIS — R413 Other amnesia: Secondary | ICD-10-CM | POA: Diagnosis not present

## 2020-03-26 MED ORDER — MEMANTINE HCL 5 MG PO TABS
ORAL_TABLET | ORAL | 0 refills | Status: DC
Start: 2020-03-26 — End: 2020-05-08

## 2020-03-26 NOTE — Progress Notes (Signed)
Reason for visit: Memory disturbance, dementia  Troy Smith is an 65 y.o. 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  The patient has had some cognitive worsening following chemotherapy for his colon cancer, but he has improved cognitively some after the chemotherapy has ceased.  The patient is still operating a motor vehicle without difficulty.  He will write a few checks but his wife is mainly doing the finances.  He stays active during the day.  He has not had any activities that have had to be given up because of memory.  He is tolerating the Aricept fairly well, he takes 10 mg at night.  He returns for further evaluation.  Past Medical History:  Diagnosis Date  . Adenocarcinoma of sigmoid colon ([place]) 03/23/2016  . Carpal tunnel syndrome 05/02/2014   Bilateral  . Cervical disc disorder with radiculopathy of cervical region 05/02/2014   Left C8  . Diabetes mellitus without complication ([place])   . Hypertension   . Port-A-Cath in place 08/10/2018    Past Surgical History:  Procedure Laterality Date  . BIOPSY  03/20/2016   Procedure: BIOPSY;  Surgeon: Rogene Houston, MD;  Location: [facility];  Service: Endoscopy;;  colon  . BOWEL RESECTION N/A 03/23/2016   Procedure: LOW ANTERIOR RESECTION;  Surgeon: Aviva Signs, MD;  Location: [facility];  Service: General;  Laterality: N/A;  . COLON SURGERY  01/2019   Done at [facility]  . COLONOSCOPY N/A 03/20/2016   Procedure: COLONOSCOPY;  Surgeon: Rogene Houston, MD;  Location: [facility];  Service: Endoscopy;  Laterality: N/A;  730  . COLONOSCOPY N/A 07/10/2016   Procedure: COLONOSCOPY;  Surgeon: Rogene Houston, MD;  Location: [facility];  Service: Endoscopy;  Laterality: N/A;  1040  . NO PAST SURGERIES    . POLYPECTOMY  07/10/2016   Procedure: POLYPECTOMY;  Surgeon: Rogene Houston, MD;  Location[facility];  Service: Endoscopy;;  colon  . PORTACATH PLACEMENT Left 08/05/2018   Procedure: INSERTION PORT-A-CATH;  Surgeon: Aviva Signs, MD;  Location: [facility];  Service: General;  Laterality: Left;  . TONSILLECTOMY      Family History  Problem Relation Age of Onset  . COPD Mother   . Melanoma Father     Social history:  reports that he has never smoked. He has never used smokeless tobacco. He reports current alcohol use. He reports that he does not use drugs.    Allergies  Allergen Reactions  . Penicillin G Nausea And Vomiting    Medications:  Prior to Admission medications   Medication Sig Start Date End Date Taking? Authorizing Provider  allopurinol (ZYLOPRIM) 300 MG tablet Take 1 tablet (300 mg total) by mouth daily. 12/25/19  Yes Derek Jack, MD  amLODipine (NORVASC) 10 MG tablet Take 10 mg by mouth daily.   Yes [provider]  diphenoxylate-atropine (LOMOTIL) 2.5-0.025 MG tablet Take 2 tablets at the first onset of diarrhea followed by 1 tablet after every watery bowel movement. 02/21/20  Yes Derek Jack, MD  donepezil (ARICEPT) 10 MG tablet Take 1 tablet (10 mg total) by mouth at bedtime. 12/05/19  Yes Derek Jack, MD  furosemide (LASIX) 20 MG tablet Take 1 tablet (20 mg total) by mouth daily as needed. 12/25/19  Yes Derek Jack, MD  insulin glargine (LANTUS) 100 UNIT/ML injection Inject  30 Units into the skin at bedtime.    Yes [provider]  loperamide (IMODIUM A-D) 2 MG tablet Take 2 at onset of diarrhea, then 1 after each watery bowel movement. 06/15/19  Yes Derek Jack, MD  magnesium oxide (MAG-OX) 400 (241.3 Mg) MG tablet Take 1 tablet (400 mg total) by mouth 2 (two) times daily. 07/11/19  Yes Derek Jack, MD  metoprolol succinate (TOPROL-XL) 50 MG 24 hr tablet Take 50 mg by mouth daily. Take with or immediately following a meal.   Yes [provider]  prochlorperazine (COMPAZINE) 10 MG tablet  Take 1 tablet (10 mg total) by mouth every 6 (six) hours as needed (Nausea or vomiting). 03/13/19  Yes Derek Jack, MD  scopolamine (TRANSDERM-SCOP) 1 MG/3DAYS Place 1 patch (1.5 mg total) onto the skin every 3 (three) days. 08/29/18  Yes Lockamy, Randi L, NP-C    ROS:  Out of a complete 14 system review of symptoms, the patient complains only of the following symptoms, and all other reviewed systems are negative.  Memory troubles  There were no vitals taken for this visit.  Physical Exam  General: The patient is alert and cooperative at the time of the examination.  Skin: No significant peripheral edema is noted.   Neurologic Exam  Mental status: The patient is alert and oriented x 3 at the time of the examination. The Mini-Mental status examination done today shows a total score 21/30.   Cranial nerves: Facial symmetry is present. Speech is normal, no aphasia or dysarthria is noted. Extraocular movements are full. Visual fields are full.  Motor: The patient has good strength in all 4 extremities.  Sensory examination: Soft touch sensation is symmetric on the face, arms, and legs.  Coordination: The patient has good finger-nose-finger and heel-to-shin bilaterally.  The patient does have some mild apraxia with use of the extremities however.  Gait and station: The patient has a normal gait. Tandem gait is unsteady.  Romberg is negative.  Reflexes: Deep tendon reflexes are symmetric.   Assessment/Plan:  1.  Memory disturbance, dementia  2.  Mild gait disorder, peripheral neuropathy  The patient is continue to have some ongoing issues with memory, we will add Namenda to the regimen.  He will follow up in 6 months.  He will call in 1 month if he is tolerating the Namenda and we will send in prescription for the maintenance dose.  Jill Alexanders MD 03/26/2020 1:53 PM  [facility] [address] [place], [place] [address]  Phone  [phone] Fax [phone]

## 2020-03-26 NOTE — Patient Instructions (Signed)
We will start Namenda for the memory to be taken with the aricept.

## 2020-03-27 ENCOUNTER — Other Ambulatory Visit: Payer: Self-pay

## 2020-03-27 ENCOUNTER — Inpatient Hospital Stay (HOSPITAL_COMMUNITY): Payer: 59 | Admitting: Hematology

## 2020-03-27 VITALS — BP 146/85 | HR 71 | Temp 97.3°F | Resp 18 | Wt 208.8 lb

## 2020-03-27 DIAGNOSIS — C187 Malignant neoplasm of sigmoid colon: Secondary | ICD-10-CM

## 2020-03-27 NOTE — Patient Instructions (Signed)
[place] at [facility] Discharge Instructions  You were seen today by Dr. Delton Coombes. He went over your recent results and scans. Due to the increase in your colon cancer marker, you will be scheduled to have a PET scan done. Dr. Delton Coombes will call you after the PET scan for follow up.   Thank you for choosing [place] at [facility] to provide your oncology and hematology care.  To afford each patient quality time with our provider, please arrive at least 15 minutes before your scheduled appointment time.   If you have a lab appointment with the [facility] please come in thru the Main Entrance and check in at the main information desk  You need to re-schedule your appointment should you arrive 10 or more minutes late.  We strive to give you quality time with our providers, and arriving late affects you and other patients whose appointments are after yours.  Also, if you no show three or more times for appointments you may be dismissed from the clinic at the providers discretion.     Again, thank you for choosing [facility].  Our hope is that these requests will decrease the amount of time that you wait before being seen by our physicians.       _____________________________________________________________  Should you have questions after your visit to [facility], please contact our office at [phone] between the hours of 8:00 a.m. and 4:30 p.m.  Voicemails left after 4:00 p.m. will not be returned until the following business day.  For prescription refill requests, have your pharmacy contact our office and allow 72 hours.    Cancer Center Support Programs:   > Cancer Support Group  2nd Tuesday of the month 1pm-2pm, Journey Room

## 2020-03-27 NOTE — Progress Notes (Signed)
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 FOLFOX x 12 cycles from 08/16/2018 to 05/31/2019. 2. Laparoscopic left partial hepatectomy and LAR on 02/01/2019. 3. Xeloda and radiation therapy to rectum 45 Gy in 25 fractions from 06/26/2019 to 08/03/2019.  NGS Results: Foundation 1 MS--stable, RAS wild-type  CURRENT THERAPY: Surveillance  BRIEF ONCOLOGIC HISTORY:  Oncology History  Adenocarcinoma of sigmoid colon ([place])  11/20/2015 Imaging   CT abd/pelvis- Focal sigmoid diverticulitis is noted without abscess formation. Focal narrowing and wall thickening of the sigmoid colon is noted in this area most likely due to inflammation, but neoplasm cannot be excluded, and sigmoidoscopy is recommended for further evaluation.   03/20/2016 Procedure   Colonoscopy by Dr. Laural Golden- Malignant partially obstructing tumor in the distal sigmoid colon. Biopsied. - Incomplete exam.   03/23/2016 Pathology Results   Colon, biopsy, sigmoid mass - ADENOCARCINOMA.   03/23/2016 Procedure   Low anterior resection with transanal anastomosis   03/26/2016 Pathology Results   Colon, segmental resection for tumor, sigmoid - INVASIVE WELL DIFFERENTIATED ADENOCARCINOMA, SPANNING 5 CM IN GREATEST DIMENSION. - TUMOR INVADES THROUGH MUSCULARIS PROPRIA TO INVOLVE SUBSEROSAL SOFT TISSUES. - UNDERLYING MARKED ACUTE AND CHRONIC INFLAMMATION WITH GIANT CELL REACTION AND ABSCESS FORMATION EXTENDING TO THE SEROSAL SURFACE AND INVOLVING THE MESENTERIC TISSUE. - MARGINS ARE NEGATIVE FOR TUMOR. - FOURTEEN BENIGN LYMPH NODES WHICH DEMONSTRATE VARIABLE INVOLVEMENT WITH ACUTE AND CHRONIC INFLAMMATION WITH ASSOCIATED GIANT CELLS WITHOUT TUMOR IDENTIFIED (0/14).   04/08/2016 Cancer  Staging   Cancer Staging Adenocarcinoma of sigmoid colon [facility]) Staging form: Colon and Rectum, AJCC 8th Edition - Pathologic stage from 04/01/2016: Stage IIA (pT3, pN0, cM0) - Signed by Baird Cancer, PA-C on 04/01/2016    07/09/2016 Imaging   CT abd/pelvis- No evidence of metastatic disease or other acute findings.  Bilateral nonobstructing renal calculi and mild bilateral renal parenchymal scarring.  Stable mildly enlarged prostate gland and findings of chronic bladder outlet obstruction.   08/16/2018 -  Chemotherapy   The patient had palonosetron (ALOXI) injection 0.25 mg, 0.25 mg, Intravenous,  Once, 12 of 12 cycles Administration: 0.25 mg (08/16/2018), 0.25 mg (09/07/2018), 0.25 mg (10/19/2018), 0.25 mg (11/02/2018), 0.25 mg (11/16/2018), 0.25 mg (11/30/2018), 0.25 mg (03/21/2019), 0.25 mg (04/04/2019), 0.25 mg (04/18/2019), 0.25 mg (05/02/2019), 0.25 mg (05/17/2019), 0.25 mg (05/31/2019) pegfilgrastim (NEULASTA) injection 6 mg, 6 mg, Subcutaneous, Once, 12 of 12 cycles Administration: 6 mg (08/18/2018), 6 mg (09/09/2018), 6 mg (10/21/2018), 6 mg (11/04/2018), 6 mg (11/18/2018), 6 mg (12/02/2018), 6 mg (03/23/2019), 6 mg (04/06/2019), 6 mg (04/20/2019), 6 mg (05/04/2019), 6 mg (05/19/2019), 6 mg (06/02/2019) irinotecan (CAMPTOSAR) 380 mg in sodium chloride 0.9 % 500 mL chemo infusion, 165 mg/m2 = 380 mg, Intravenous,  Once, 1 of 1 cycle Administration: 380 mg (08/16/2018) leucovorin 900 mg in dextrose 5 % 250 mL infusion, 400 mg/m2 = 900 mg, Intravenous,  Once, 12 of 12 cycles Administration: 900 mg (08/16/2018), 900 mg (09/07/2018), 900 mg (10/19/2018), 900 mg (11/02/2018), 900 mg (11/16/2018), 800 mg (11/30/2018), 900 mg (03/21/2019), 900 mg (04/04/2019), 900 mg (04/18/2019), 900 mg (05/02/2019), 900 mg (05/17/2019), 800 mg (05/31/2019) oxaliplatin (ELOXATIN) 190 mg in dextrose 5 % 500 mL chemo infusion, 85 mg/m2 = 190 mg, Intravenous,  Once, 12 of 12 cycles Dose modification:  68 mg/m2 (80 % of original dose 85 mg/m2, Cycle 2,  Reason: Provider Judgment), 68 mg/m2 (80 % of original dose 85 mg/m2, Cycle 3, Reason: Provider Judgment) Administration: 190 mg (08/16/2018), 150 mg (09/07/2018), 150 mg (10/19/2018), 150 mg (11/16/2018), 150 mg (11/02/2018), 135 mg (11/30/2018), 150 mg (03/21/2019), 150 mg (04/04/2019), 150 mg (04/18/2019), 150 mg (05/02/2019), 150 mg (05/17/2019), 130 mg (05/31/2019) fluorouracil (ADRUCIL) chemo injection 700 mg, 320 mg/m2 = 700 mg (100 % of original dose 320 mg/m2), Intravenous,  Once, 9 of 9 cycles Dose modification: 320 mg/m2 (original dose 320 mg/m2, Cycle 4) Administration: 700 mg (11/02/2018), 700 mg (11/16/2018), 650 mg (11/30/2018), 700 mg (03/21/2019), 700 mg (04/04/2019), 700 mg (04/18/2019), 700 mg (05/02/2019), 700 mg (05/17/2019), 700 mg (05/31/2019) fosaprepitant (EMEND) 150 mg, dexamethasone (DECADRON) 12 mg in sodium chloride 0.9 % 145 mL IVPB, , Intravenous,  Once, 12 of 12 cycles Administration:  (08/16/2018),  (09/07/2018),  (10/19/2018),  (11/02/2018),  (11/16/2018),  (11/30/2018),  (03/21/2019),  (04/04/2019),  (04/18/2019),  (05/02/2019),  (05/17/2019),  (05/31/2019) fluorouracil (ADRUCIL) 5,400 mg in sodium chloride 0.9 % 142 mL chemo infusion, 2,400 mg/m2 = 5,400 mg, Intravenous, 1 Day/Dose, 12 of 12 cycles Dose modification: 1,920 mg/m2 (80 % of original dose 2,400 mg/m2, Cycle 3, Reason: Provider Judgment) Administration: 5,400 mg (08/16/2018), 5,400 mg (09/07/2018), 4,300 mg (10/19/2018), 4,300 mg (11/02/2018), 4,300 mg (11/16/2018), 3,850 mg (11/30/2018), 4,300 mg (03/21/2019), 4,300 mg (04/04/2019), 4,300 mg (04/18/2019), 4,300 mg (05/02/2019), 4,300 mg (05/17/2019), 3,850 mg (05/31/2019) bevacizumab-awwb (MVASI) 500 mg in sodium chloride 0.9 % 100 mL chemo infusion, 5 mg/kg = 500 mg (100 % of original dose 5 mg/kg), Intravenous,  Once, 6 of 6 cycles Dose modification: 5 mg/kg (original dose 5 mg/kg, Cycle 1), 5 mg/kg (original dose 5 mg/kg, Cycle 2) Administration: 500 mg (08/16/2018), 500 mg (09/07/2018), 500 mg (10/19/2018), 500 mg  (11/02/2018), 450 mg (11/16/2018), 400 mg (11/30/2018)  for chemotherapy treatment.      CANCER STAGING: Cancer Staging Adenocarcinoma of sigmoid colon [facility]) Staging form: Colon and Rectum, AJCC 8th Edition - Pathologic stage from 04/01/2016: Stage IIA (pT3, pN0, cM0) - Signed by Baird Cancer, PA-C on 04/01/2016   INTERVAL HISTORY:  Troy Smith, a 65 y.o. 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 Troy Smith was last seen on 12/25/2019.   Today he is accompanied by his wife and he reports feeling well. He denies having any recent infections. He continues having diarrhea 3 times a week, with most stools being soft and formed. He continues having stiffness in the medial 3 fingers of his right hand; he went to Dr. Aline Brochure on 01/31 and had a steroid injection. He has noticed a knot under his right nipple over the past week, which was slightly tender and swollen but has come down since and denies nipple discharge.    REVIEW OF SYSTEMS:  Review of Systems  Constitutional: Positive for fatigue (75%). Negative for appetite change.  Cardiovascular: Positive for leg swelling (on Lasix).  Gastrointestinal: Positive for diarrhea (x3 per week).  All other systems reviewed and are negative.   PAST MEDICAL/SURGICAL HISTORY:  Past Medical History:  Diagnosis Date  . Adenocarcinoma of sigmoid colon ([place]) 03/23/2016  . Carpal tunnel syndrome 05/02/2014   Bilateral  . Cervical disc disorder with radiculopathy of cervical region 05/02/2014   Left C8  . Diabetes mellitus without complication ([place])   . Hypertension   . Port-A-Cath in place 08/10/2018   Past Surgical  History:  Procedure Laterality Date  . BIOPSY  03/20/2016   Procedure: BIOPSY;  Surgeon: Rogene Houston, MD;  Location: [facility];  Service: Endoscopy;;  colon  . BOWEL RESECTION N/A 03/23/2016   Procedure: LOW ANTERIOR RESECTION;  Surgeon: Aviva Signs, MD;  Location: [facility] ORS;  Service:  General;  Laterality: N/A;  . COLON SURGERY  01/2019   [facility] at [facility]  . COLONOSCOPY N/A 03/20/2016   Procedure: COLONOSCOPY;  Surgeon: Rogene Houston, MD;  Location: [facility];  Service: Endoscopy;  Laterality: N/A;  730  . COLONOSCOPY N/A 07/10/2016   Procedure: COLONOSCOPY;  Surgeon: Rogene Houston, MD;  Location: [facility];  Service: Endoscopy;  Laterality: N/A;  1040  . NO PAST SURGERIES    . POLYPECTOMY  07/10/2016   Procedure: POLYPECTOMY;  Surgeon: Rogene Houston, MD;  Location: [facility];  Service: Endoscopy;;  colon  . PORTACATH PLACEMENT Left 08/05/2018   Procedure: INSERTION PORT-A-CATH;  Surgeon: Aviva Signs, MD;  Location: [facility] ORS;  Service: General;  Laterality: Left;  . TONSILLECTOMY      SOCIAL HISTORY:  Social History   Socioeconomic History  . Marital status: Married    Spouse name: Not on file  . Number of children: Not on file  . Years of education: Not on file  . Highest education level: Not on file  Occupational History  . Not on file  Tobacco Use  . Smoking status: Never Smoker  . Smokeless tobacco: Never Used  Vaping Use  . Vaping Use: Never used  Substance and Sexual Activity  . Alcohol use: Yes    Comment: rarely  . Drug use: No  . Sexual activity: Not on file  Other Topics Concern  . Not on file  Social History Narrative  . Not on file   Social Determinants of Health   Financial Resource Strain: Not on file  Food Insecurity: Not on file  Transportation Needs: Not on file  Physical Activity: Not on file  Stress: Not on file  Social Connections: Not on file  Intimate Partner Violence: Not on file    FAMILY HISTORY:  Family History  Problem Relation Age of Onset  . COPD Mother   . Melanoma Father     CURRENT MEDICATIONS:  Current Outpatient Medications  Medication Sig Dispense Refill  . allopurinol (ZYLOPRIM) 300 MG tablet Take 1 tablet (300 mg total) by mouth daily. 30 tablet 6  . amLODipine (NORVASC) 10 MG  tablet Take 10 mg by mouth daily.    . diphenoxylate-atropine (LOMOTIL) 2.5-0.025 MG tablet Take 2 tablets at the first onset of diarrhea followed by 1 tablet after every watery bowel movement. 60 tablet 2  . donepezil (ARICEPT) 10 MG tablet Take 1 tablet (10 mg total) by mouth at bedtime. 30 tablet 3  . furosemide (LASIX) 20 MG tablet Take 1 tablet (20 mg total) by mouth daily as needed. 30 tablet 3  . insulin glargine (LANTUS) 100 UNIT/ML injection Inject 30 Units into the skin at bedtime.     Marland Kitchen loperamide (IMODIUM A-D) 2 MG tablet Take 2 at onset of diarrhea, then 1 after each watery bowel movement. 100 tablet 1  . magnesium oxide (MAG-OX) 400 (241.3 Mg) MG tablet Take 1 tablet (400 mg total) by mouth 2 (two) times daily. 60 tablet 2  . memantine (NAMENDA) 5 MG tablet Take 1 tablet daily for one week, then take 1 tablet twice daily for one week, then take  1 tablet in the morning and 2 in the evening for one week, then take 2 tablets twice daily 70 tablet 0  . metoprolol succinate (TOPROL-XL) 50 MG 24 hr tablet Take 50 mg by mouth daily. Take with or immediately following a meal.    . prochlorperazine (COMPAZINE) 10 MG tablet Take 1 tablet (10 mg total) by mouth every 6 (six) hours as needed (Nausea or vomiting). 60 tablet 1  . scopolamine (TRANSDERM-SCOP) 1 MG/3DAYS Place 1 patch (1.5 mg total) onto the skin every 3 (three) days. 10 patch 12   No current facility-administered medications for this visit.    ALLERGIES:  Allergies  Allergen Reactions  . Penicillin G Nausea And Vomiting    PHYSICAL EXAM:  Performance status (ECOG): 1 - Symptomatic but completely ambulatory  Vitals:   03/27/20 1539  BP: (!) 146/85  Pulse: 71  Resp: 18  Temp: (!) 97.3 F (36.3 C)  SpO2: 100%   Wt Readings from Last 3 Encounters:  03/27/20 208 lb 12.8 oz (94.7 kg)  03/11/20 207 lb (93.9 kg)  12/25/19 206 lb 6.4 oz (93.6 kg)   Physical Exam Vitals reviewed.  Constitutional:      Appearance: Normal  appearance.  Cardiovascular:     Rate and Rhythm: Normal rate and regular rhythm.     Pulses: Normal pulses.     Heart sounds: Normal heart sounds.  Pulmonary:     Effort: Pulmonary effort is normal.     Breath sounds: Normal breath sounds.  Chest:  Breasts:     Right: No swelling, nipple discharge, tenderness or supraclavicular adenopathy.     Left: No supraclavicular adenopathy.    Abdominal:     Palpations: Abdomen is soft. There is no hepatomegaly, splenomegaly or mass.     Tenderness: There is no abdominal tenderness.     Hernia: No hernia is present.  Musculoskeletal:     Right hand: Decreased range of motion (decreased flexion in medial 3 fingers).     Right lower leg: No edema.     Left lower leg: No edema.  Lymphadenopathy:     Cervical: No cervical adenopathy.     Upper Body:     Right upper body: No supraclavicular adenopathy.     Left upper body: No supraclavicular adenopathy.  Neurological:     General: No focal deficit present.     Mental Status: He is alert and oriented to person, place, and time.  Psychiatric:        Mood and Affect: Mood normal.        Behavior: Behavior normal.      LABORATORY DATA:  I have reviewed the labs as listed.  CBC Latest Ref Rng & Units 03/20/2020 12/21/2019 09/14/2019  WBC 4.0 - 10.5 K/uL 4.3 5.3 4.9  Hemoglobin 13.0 - 17.0 g/dL 10.9(L) 10.2(L) 10.5(L)  Hematocrit 39.0 - 52.0 % 33.7(L) 32.3(L) 32.4(L)  Platelets 150 - 400 K/uL 146(L) 135(L) 133(L)   CMP Latest Ref Rng & Units 03/20/2020 12/21/2019 09/14/2019  Glucose 70 - 99 mg/dL 152(H) 125(H) 130(H)  BUN 8 - 23 mg/dL 20 21 24(H)  Creatinine 0.61 - 1.24 mg/dL 1.24 1.38(H) 1.48(H)  Sodium 135 - 145 mmol/L 136 140 139  Potassium 3.5 - 5.1 mmol/L 3.5 5.0 3.8  Chloride 98 - 111 mmol/L 105 108 103  CO2 22 - 32 mmol/L 24 25 25   Calcium 8.9 - 10.3 mg/dL 9.2 9.3 9.7  Total Protein 6.5 - 8.1 g/dL 6.6 6.3(L) 6.6  Total Bilirubin 0.3 - 1.2 mg/dL 0.9 0.8 1.1  Alkaline Phos 38 - 126 U/L  87 67 61  AST 15 - 41 U/L 14(L) 12(L) 15  ALT 0 - 44 U/L 12 13 14    Lab Results  Component Value Date   CEA1 29.6 (H) 03/20/2020   CEA1 6.4 (H) 12/21/2019   CEA1 2.1 09/14/2019    DIAGNOSTIC IMAGING:  I have independently reviewed the scans and discussed with the patient. CT CHEST ABDOMEN PELVIS W CONTRAST  Result Date: 03/21/2020 CLINICAL DATA:  Restaging metastatic colon cancer. History of sigmoid colon resection and partial hepatectomy. EXAM: CT CHEST, ABDOMEN, AND PELVIS WITH CONTRAST TECHNIQUE: Multidetector CT imaging of the chest, abdomen and pelvis was performed following the standard protocol during bolus administration of intravenous contrast. CONTRAST:  141mL OMNIPAQUE IOHEXOL 300 MG/ML  SOLN COMPARISON:  CT scan 12/21/2019 FINDINGS: CT CHEST FINDINGS Cardiovascular: The heart is normal in size. No pericardial effusion. The aorta is normal in caliber. No dissection. Stable calcifications noted along the aortic valve and involving the coronary arteries. Mediastinum/Nodes: No mediastinal or hilar mass or adenopathy. The esophagus is grossly normal. The thyroid gland is normal. Lungs/Pleura: No worrisome pulmonary lesions. Stable 4 mm subpleural right lower lobe pulmonary nodule. No acute pulmonary findings. No pleural effusions or pleural nodules. Stable right middle lobe scarring changes. Musculoskeletal: No significant bony findings. Mild bilateral gynecomastia. CT ABDOMEN PELVIS FINDINGS Hepatobiliary: Stable surgical changes from a partial left hepatectomy. No findings for new metastatic disease. The gallbladder is surgically absent. No common bile duct dilatation. Pancreas: No mass, inflammation or ductal dilatation. Spleen: Normal size.  No focal lesions. Adrenals/Urinary Tract: Adrenal glands are normal and stable. Mild renal cortical thinning and scarring involving both kidneys. Small bilateral renal calculi again noted. No worrisome renal lesions or hydronephrosis. Stable mild  uniform wall thickening of the bladder likely due to partial bladder outlet obstruction with enlarged prostate gland and median lobe hypertrophy impressing on the base of the bladder. There are numerous dependently layering bladder calculi. Stomach/Bowel: The stomach, duodenum, small bowel and colon are unremarkable. No acute inflammatory changes, mass lesions or obstructive findings. Stable surgical changes involving the rectosigmoid area with mild diffuse wall thickening but no discrete mass is identified. Vascular/Lymphatic: Stable vascular calcifications. No aneurysm or dissection. The branch vessels are patent. The major venous structures are patent. No mesenteric or retroperitoneal mass or adenopathy. Reproductive: Enlarged prostate gland with median lobe hypertrophy impressing on the base of the bladder. The seminal vesicles are grossly normal. Other: No pelvic mass or adenopathy. No free pelvic fluid collections. No inguinal mass or adenopathy. No abdominal wall hernia or subcutaneous lesions. Musculoskeletal: No significant bony findings. Stable advanced degenerative changes involving the lumbar spine and moderate degenerative changes involving both hips. IMPRESSION: 1. Stable surgical changes from a partial left hepatectomy. No findings for recurrent or new metastatic disease. 2. Stable 4 mm subpleural right lower lobe pulmonary nodule. No new or progressive findings. 3. Stable surgical changes involving the rectosigmoid area with mild diffuse wall thickening but no discrete mass is identified. 4. Enlarged prostate gland with median lobe hypertrophy impressing on the base of the bladder. 5. Numerous dependently layering bladder calculi. 6. Stable small bilateral renal calculi. 7. Aortic atherosclerosis. Aortic Atherosclerosis (ICD10-I70.0). Electronically Signed   By: Marijo Sanes M.D.   On: 03/21/2020 16:17   DG Knee AP/LAT W/Sunrise Left  Result Date: 03/11/2020 Left knee images x3 including an AP  of both knees on one view  X-rays show joint space narrowing both knees bilaterally medial compartments with neutral tibiofemoral alignment.  We see some peripheral osteophytes around the patellofemoral joint but the patella is centered nicely On lateral x-ray we find patellofemoral arthritis and joint space narrowing some mild posterior osteophytes Impression bilateral knee arthritis    ASSESSMENT:  1. 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 -Laparoscopic partial hepatectomy, redo low anterior resection on 02/01/2019, pathology showing YPT3Y PN 0. Positive circumferential/radial/mesenteric margin. -6 more cycles of FOLFOX completed on 05/31/2019. -Xeloda with radiation from 06/26/2019 through 08/03/2019. -CT CAP on 09/14/2019 shows postoperative changes in the rectosigmoid resection and reanastomosis with interval increase in posttreatment perirectal and presacral soft tissue thickening consistent with radiation changes. No adenopathy. Multiple stable small bilateral pulmonary nodules, likely benign. No new evidence of metastatic disease. Unchanged splenomegaly.   PLAN:  1. Metastatic sigmoid colon cancer to the liver: -Denies any major changes in bowels.  No bleeding per rectum or melena. -No recent infections or hospitalizations. -Reviewed labs from 03/20/2020.  LFTs are normal.  Mild normocytic anemia with hemoglobin 10.9 is stable. -CEA is 29.6, up from 6.4 in November. -Reviewed CT CAP from 03/21/2020.  No evidence of recurrence. -Because of the big CEA jumped, I have recommended PET CT scan.  We will follow-up by phone visit. -He has mild gynecomastia on the right side.  He noticed it a week ago which has improved in size.  2. Diarrhea: -He has intermittent episodes of diarrhea once or twice a week.  Rarely  requires Lomotil.  3. Hypomagnesemia: -Continue magnesium twice daily.  4. 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  Neurologist Dr. Jannifer Smith has added Namenda yesterday.  5. Gout: -Continue allopurinol 300 mg daily.  6. CKD: -Creatinine improved to 1.24.  7.  Lower extremity swelling: -He has 1+ swelling.  Albumin is 3.7.  Continue Lasix as needed.   Orders placed this encounter:  No orders of the defined types were placed in this encounter.    Derek Jack, MD [place] [phone]   I, Milinda Antis, am acting as a scribe for Dr. Sanda Linger.  I, Derek Jack MD, have reviewed the above documentation for accuracy and completeness, and I agree with the above.

## 2020-04-08 ENCOUNTER — Ambulatory Visit (HOSPITAL_COMMUNITY)
Admission: RE | Admit: 2020-04-08 | Discharge: 2020-04-08 | Disposition: A | Discharge status: Home / Self Care | Payer: 59 | Source: Ambulatory Visit | Attending: Hematology | Admitting: Hematology

## 2020-04-08 ENCOUNTER — Other Ambulatory Visit: Payer: Self-pay

## 2020-04-08 DIAGNOSIS — C187 Malignant neoplasm of sigmoid colon: Secondary | ICD-10-CM | POA: Insufficient documentation

## 2020-04-08 MED ORDER — FLUDEOXYGLUCOSE F - 18 (FDG) INJECTION
11.9500 | Freq: Once | INTRAVENOUS | Status: AC | PRN
  Administered 2020-04-08: 11.95 via INTRAVENOUS

## 2020-04-10 ENCOUNTER — Other Ambulatory Visit: Payer: Self-pay

## 2020-04-10 ENCOUNTER — Inpatient Hospital Stay (HOSPITAL_COMMUNITY): Payer: 59 | Attending: Hematology | Admitting: Hematology

## 2020-04-10 DIAGNOSIS — C187 Malignant neoplasm of sigmoid colon: Secondary | ICD-10-CM

## 2020-04-10 NOTE — Progress Notes (Signed)
Virtual Visit via Telephone Note  I connected with Troy Smith on 04/10/20 at  4:15 PM EST by telephone and verified that I am speaking with the correct person using two identifiers.  Location: Patient: At home Provider: In office   I discussed the limitations, risks, security and privacy concerns of performing an evaluation and management service by telephone and the availability of in person appointments. I also discussed with the patient that there may be a patient responsible charge related to this service. The patient expressed understanding and agreed to proceed.   History of Present Illness: Troy Smith was treated for metastatic sigmoid colon cancer to the liver.  He underwent laparoscopic partial hepatectomy, redo low anterior resection on 02/01/2019, pathology showing [id number].  Positive circumferential/radial/mesenteric margin.   Observations/Objective: He was found to have elevated CEA of 29.6.  Denies any low back pain.  He has occasional diarrhea which is stable and controlled with Lomotil.  Appetite is 100%.  Energy levels are 80%.  Assessment and Plan:  1.  Metastatic sigmoid colon cancer to the liver: -CEA of 29.6 with recent jump from 6.40 November 2021. -We reviewed results of the PET scan from 04/08/2020.  There is a 2.3 x 2.4 cm soft tissue lesion in the presacral region, SUV 5.4.  Sclerosis involving the sacrum, SUV 6.4, suspicious for meta stasis or possibly radiation changes.  2 small left axillary lymph nodes measuring up to 6 mm short axis, SUV 23.0. -I discussed the findings with the patient and his wife. -I think the left axillary lymph nodes are reactive.  He could not recollect which side he had peripheral access for diet.  There measuring only 6 mm. -However I am concerned about the presacral mass which is highly consistent with recurrence. -I have recommended doing a pelvic MRI with and without contrast to better delineate whether sacral bone is  involved. -We will talk to him over the phone about the results. -I will also consult Dr. Jyl Heinz at [facility].   Follow Up Instructions: Follow-up visit after the MRI.   I discussed the assessment and treatment plan with the patient. The patient was provided an opportunity to ask questions and all were answered. The patient agreed with the plan and demonstrated an understanding of the instructions.   The patient was advised to call back or seek an in-person evaluation if the symptoms worsen or if the condition fails to improve as anticipated.  I provided 12 minutes of non-face-to-face time during this encounter.   Derek Jack, MD

## 2020-04-18 ENCOUNTER — Ambulatory Visit (HOSPITAL_COMMUNITY): Payer: 59

## 2020-04-22 ENCOUNTER — Telehealth (HOSPITAL_COMMUNITY): Payer: 59 | Admitting: Hematology

## 2020-04-26 ENCOUNTER — Ambulatory Visit (HOSPITAL_COMMUNITY)
Admission: RE | Admit: 2020-04-26 | Discharge: 2020-04-26 | Disposition: A | Discharge status: Home / Self Care | Payer: 59 | Source: Ambulatory Visit | Attending: Hematology | Admitting: Hematology

## 2020-04-26 DIAGNOSIS — C187 Malignant neoplasm of sigmoid colon: Secondary | ICD-10-CM | POA: Diagnosis not present

## 2020-04-26 MED ORDER — GADOBUTROL 1 MMOL/ML IV SOLN
10.0000 mL | Freq: Once | INTRAVENOUS | Status: AC | PRN
  Administered 2020-04-26: 10 mL via INTRAVENOUS

## 2020-04-27 ENCOUNTER — Other Ambulatory Visit (HOSPITAL_COMMUNITY): Payer: Self-pay | Admitting: Hematology

## 2020-04-27 DIAGNOSIS — C187 Malignant neoplasm of sigmoid colon: Secondary | ICD-10-CM

## 2020-04-30 ENCOUNTER — Telehealth (HOSPITAL_COMMUNITY): Payer: 59 | Admitting: Hematology

## 2020-05-03 ENCOUNTER — Other Ambulatory Visit: Payer: Self-pay

## 2020-05-03 ENCOUNTER — Inpatient Hospital Stay (HOSPITAL_BASED_OUTPATIENT_CLINIC_OR_DEPARTMENT_OTHER): Payer: 59 | Admitting: Hematology

## 2020-05-03 DIAGNOSIS — C787 Secondary malignant neoplasm of liver and intrahepatic bile duct: Secondary | ICD-10-CM | POA: Diagnosis not present

## 2020-05-03 DIAGNOSIS — C187 Malignant neoplasm of sigmoid colon: Secondary | ICD-10-CM | POA: Diagnosis not present

## 2020-05-03 NOTE — Progress Notes (Signed)
Virtual Visit via Telephone Note  I connected with Troy Smith on 05/03/20 at  9:00 AM EDT by telephone and verified that I am speaking with the correct person using two identifiers.  Location: Patient: At home Provider: At home   I discussed the limitations, risks, security and privacy concerns of performing an evaluation and management service by telephone and the availability of in person appointments. I also discussed with the patient that there may be a patient responsible charge related to this service. The patient expressed understanding and agreed to proceed.   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  He underwent laparoscopic partial hepatectomy, redo low anterior resection on 02/01/2019, pathology showing [id number].  Positive circumferential/radial/mesenteric margin.  He finished radiation and has been on surveillance.   Observations/Objective: He denies any rectal pain.  No change in bowel habits.  Denies any new onset pains.  Assessment and Plan:  1.  Recurrent sigmoid colon cancer: -We discussed the MRI of the pelvis with and without contrast from 04/26/2020 which showed enhancing lesion involving the S1 segment of the sacrum measuring 4.2 x 3.4 cm, extension into the left sacral wing identified.  Presacral soft tissue nodule measuring 2.4 x 2.1 cm present. -I have reached out to Dr. Deveron Furlong at [facility]. -This is to determine if he has any surgical options for resection of presacral soft tissue mass followed by focused radiation to the sacral lesion. -If Dr. Flonnie Hailstone wants to see the patient, will make CD of the scans for the patient to carry with him. -If not a surgical candidate, palliative chemotherapy/radiotherapy will be the option.   Follow Up Instructions: To be determined after talking to Dr. Flonnie Hailstone.   I discussed the assessment and treatment plan with the patient. The patient was provided an  opportunity to ask questions and all were answered. The patient agreed with the plan and demonstrated an understanding of the instructions.   The patient was advised to call back or seek an in-person evaluation if the symptoms worsen or if the condition fails to improve as anticipated.   I provided 12 minutes of non-face-to-face time during this encounter.   Doreatha Massed, MD

## 2020-05-08 ENCOUNTER — Telehealth: Payer: Self-pay | Admitting: Neurology

## 2020-05-08 MED ORDER — MEMANTINE HCL 10 MG PO TABS
10.0000 mg | ORAL_TABLET | Freq: Two times a day (BID) | ORAL | 5 refills | Status: DC
Start: 2020-05-08 — End: 2020-11-08

## 2020-05-08 NOTE — Telephone Encounter (Signed)
New memantine Rx sent in with maintenance dose per Dr Jannifer Franklin' office note.

## 2020-05-08 NOTE — Telephone Encounter (Signed)
Pt's wife, Karee Christopherson (on [place]) called, he did good with the 30 day supply of memantine (NAMENDA) 5 MG tablet. Would like for Dr. Jannifer Franklin to prescribe this medication. Would like a call from the nurse

## 2020-05-09 ENCOUNTER — Encounter (HOSPITAL_COMMUNITY): Payer: Self-pay

## 2020-05-10 ENCOUNTER — Encounter (HOSPITAL_COMMUNITY): Payer: Self-pay | Admitting: Lab

## 2020-05-10 ENCOUNTER — Encounter (HOSPITAL_COMMUNITY): Payer: Self-pay

## 2020-05-10 NOTE — Progress Notes (Unsigned)
Referral sent to Dr Crisoforo Oxford @ [phone].  Records faxed on 4/1

## 2020-06-09 DIAGNOSIS — C189 Malignant neoplasm of colon, unspecified: Secondary | ICD-10-CM

## 2020-06-09 HISTORY — DX: Malignant neoplasm of colon, unspecified: C18.9

## 2020-06-17 ENCOUNTER — Inpatient Hospital Stay (HOSPITAL_COMMUNITY): Payer: BC Managed Care – PPO | Attending: Hematology | Admitting: Hematology

## 2020-06-17 ENCOUNTER — Other Ambulatory Visit: Payer: Self-pay

## 2020-06-17 ENCOUNTER — Inpatient Hospital Stay (HOSPITAL_COMMUNITY): Payer: BC Managed Care – PPO

## 2020-06-17 VITALS — BP 128/79 | HR 64 | Temp 98.6°F | Resp 16 | Wt 213.5 lb

## 2020-06-17 DIAGNOSIS — C787 Secondary malignant neoplasm of liver and intrahepatic bile duct: Secondary | ICD-10-CM | POA: Diagnosis not present

## 2020-06-17 DIAGNOSIS — Z5111 Encounter for antineoplastic chemotherapy: Secondary | ICD-10-CM | POA: Insufficient documentation

## 2020-06-17 DIAGNOSIS — C187 Malignant neoplasm of sigmoid colon: Secondary | ICD-10-CM | POA: Diagnosis not present

## 2020-06-17 DIAGNOSIS — Z5189 Encounter for other specified aftercare: Secondary | ICD-10-CM | POA: Diagnosis not present

## 2020-06-17 LAB — CBC WITH DIFFERENTIAL/PLATELET
Abs Immature Granulocytes: 0.02 10*3/uL (ref 0.00–0.07)
Basophils Absolute: 0 10*3/uL (ref 0.0–0.1)
Basophils Relative: 1 %
Eosinophils Absolute: 0.2 10*3/uL (ref 0.0–0.5)
Eosinophils Relative: 3 %
HCT: 35.6 % — ABNORMAL LOW (ref 39.0–52.0)
Hemoglobin: 11.4 g/dL — ABNORMAL LOW (ref 13.0–17.0)
Immature Granulocytes: 0 %
Lymphocytes Relative: 17 %
Lymphs Abs: 1.1 10*3/uL (ref 0.7–4.0)
MCH: 28.9 pg (ref 26.0–34.0)
MCHC: 32 g/dL (ref 30.0–36.0)
MCV: 90.4 fL (ref 80.0–100.0)
Monocytes Absolute: 0.3 10*3/uL (ref 0.1–1.0)
Monocytes Relative: 5 %
Neutro Abs: 4.7 10*3/uL (ref 1.7–7.7)
Neutrophils Relative %: 74 %
Platelets: 159 10*3/uL (ref 150–400)
RBC: 3.94 MIL/uL — ABNORMAL LOW (ref 4.22–5.81)
RDW: 14 % (ref 11.5–15.5)
WBC: 6.4 10*3/uL (ref 4.0–10.5)
nRBC: 0 % (ref 0.0–0.2)

## 2020-06-17 LAB — COMPREHENSIVE METABOLIC PANEL
ALT: 12 U/L (ref 0–44)
AST: 14 U/L — ABNORMAL LOW (ref 15–41)
Albumin: 4.2 g/dL (ref 3.5–5.0)
Alkaline Phosphatase: 132 U/L — ABNORMAL HIGH (ref 38–126)
Anion gap: 6 (ref 5–15)
BUN: 22 mg/dL (ref 8–23)
CO2: 26 mmol/L (ref 22–32)
Calcium: 9.6 mg/dL (ref 8.9–10.3)
Chloride: 107 mmol/L (ref 98–111)
Creatinine, Ser: 1.47 mg/dL — ABNORMAL HIGH (ref 0.61–1.24)
GFR, Estimated: 53 mL/min — ABNORMAL LOW (ref >60)
Glucose, Bld: 125 mg/dL — ABNORMAL HIGH (ref 70–99)
Potassium: 4.5 mmol/L (ref 3.5–5.1)
Sodium: 139 mmol/L (ref 135–145)
Total Bilirubin: 1 mg/dL (ref 0.3–1.2)
Total Protein: 7 g/dL (ref 6.5–8.1)

## 2020-06-17 NOTE — Patient Instructions (Signed)
[place] at [facility] Discharge Instructions  You were seen today by Dr. Delton Coombes. He went over your recent results. You will be scheduled to have a CT scan of the chest and Abdomen before starting treatment. You will tentatively begin treatment this week, consisting of FOLFOX every 2 weeks. Dr. Delton Coombes will see you back in 2 weeks for labs and follow up.   Thank you for choosing [place] at [facility] to provide your oncology and hematology care.  To afford each patient quality time with our provider, please arrive at least 15 minutes before your scheduled appointment time.   If you have a lab appointment with the [facility] please come in thru the Main Entrance and check in at the main information desk  You need to re-schedule your appointment should you arrive 10 or more minutes late.  We strive to give you quality time with our providers, and arriving late affects you and other patients whose appointments are after yours.  Also, if you no show three or more times for appointments you may be dismissed from the clinic at the providers discretion.     Again, thank you for choosing [facility].  Our hope is that these requests will decrease the amount of time that you wait before being seen by our physicians.       _____________________________________________________________  Should you have questions after your visit to [facility], please contact our office at [phone] between the hours of 8:00 a.m. and 4:30 p.m.  Voicemails left after 4:00 p.m. will not be returned until the following business day.  For prescription refill requests, have your pharmacy contact our office and allow 72 hours.    Cancer Center Support Programs:   > Cancer Support Group  2nd Tuesday of the month 1pm-2pm, Journey Room

## 2020-06-17 NOTE — Progress Notes (Signed)
[name], [name]   CLINIC:  Medical Oncology/Hematology  PCP:  [name], [place] [place] [address] / [place] [address] [phone]   REASON FOR VISIT:  Follow-up for metastatic colon cancer with local recurrence  PRIOR THERAPY: Chemotherapy followed by chemoradiation   CURRENT THERAPY: Reinitiation of chemotherapy.  BRIEF ONCOLOGIC HISTORY:  Oncology History  Adenocarcinoma of sigmoid colon ([place])  11/20/2015 Imaging   CT abd/pelvis- Focal sigmoid diverticulitis is noted without abscess formation. Focal narrowing and wall thickening of the sigmoid colon is noted in this area most likely due to inflammation, but neoplasm cannot be excluded, and sigmoidoscopy is recommended for further evaluation.   03/20/2016 Procedure   Colonoscopy by Dr. Laural Golden- Malignant partially obstructing tumor in the distal sigmoid colon. Biopsied. - Incomplete exam.   03/23/2016 Pathology Results   Colon, biopsy, sigmoid mass - ADENOCARCINOMA.   03/23/2016 Procedure   Low anterior resection with transanal anastomosis   03/26/2016 Pathology Results   Colon, segmental resection for tumor, sigmoid - INVASIVE WELL DIFFERENTIATED ADENOCARCINOMA, SPANNING 5 CM IN GREATEST DIMENSION. - TUMOR INVADES THROUGH MUSCULARIS PROPRIA TO INVOLVE SUBSEROSAL SOFT TISSUES. - UNDERLYING MARKED ACUTE AND CHRONIC INFLAMMATION WITH GIANT CELL REACTION AND ABSCESS FORMATION EXTENDING TO THE SEROSAL SURFACE AND INVOLVING THE MESENTERIC TISSUE. - MARGINS ARE NEGATIVE FOR TUMOR. - FOURTEEN BENIGN LYMPH NODES WHICH DEMONSTRATE VARIABLE INVOLVEMENT WITH ACUTE AND CHRONIC INFLAMMATION WITH ASSOCIATED GIANT CELLS WITHOUT TUMOR IDENTIFIED (0/14).   04/08/2016 Cancer Staging   Cancer Staging Adenocarcinoma of sigmoid colon [facility]) Staging form: Colon and Rectum, AJCC 8th Edition - Pathologic stage from 04/01/2016: Stage IIA (pT3, pN0, cM0) - Signed by Baird Cancer, PA-C  on 04/01/2016    07/09/2016 Imaging   CT abd/pelvis- No evidence of metastatic disease or other acute findings.  Bilateral nonobstructing renal calculi and mild bilateral renal parenchymal scarring.  Stable mildly enlarged prostate gland and findings of chronic bladder outlet obstruction.   08/16/2018 -  Chemotherapy   The patient had palonosetron (ALOXI) injection 0.25 mg, 0.25 mg, Intravenous,  Once, 12 of 12 cycles Administration: 0.25 mg (08/16/2018), 0.25 mg (09/07/2018), 0.25 mg (10/19/2018), 0.25 mg (11/02/2018), 0.25 mg (11/16/2018), 0.25 mg (11/30/2018), 0.25 mg (03/21/2019), 0.25 mg (04/04/2019), 0.25 mg (04/18/2019), 0.25 mg (05/02/2019), 0.25 mg (05/17/2019), 0.25 mg (05/31/2019) pegfilgrastim (NEULASTA) injection 6 mg, 6 mg, Subcutaneous, Once, 12 of 12 cycles Administration: 6 mg (08/18/2018), 6 mg (09/09/2018), 6 mg (10/21/2018), 6 mg (11/04/2018), 6 mg (11/18/2018), 6 mg (12/02/2018), 6 mg (03/23/2019), 6 mg (04/06/2019), 6 mg (04/20/2019), 6 mg (05/04/2019), 6 mg (05/19/2019), 6 mg (06/02/2019) irinotecan (CAMPTOSAR) 380 mg in sodium chloride 0.9 % 500 mL chemo infusion, 165 mg/m2 = 380 mg, Intravenous,  Once, 1 of 1 cycle Administration: 380 mg (08/16/2018) leucovorin 900 mg in dextrose 5 % 250 mL infusion, 400 mg/m2 = 900 mg, Intravenous,  Once, 12 of 12 cycles Administration: 900 mg (08/16/2018), 900 mg (09/07/2018), 900 mg (10/19/2018), 900 mg (11/02/2018), 900 mg (11/16/2018), 800 mg (11/30/2018), 900 mg (03/21/2019), 900 mg (04/04/2019), 900 mg (04/18/2019), 900 mg (05/02/2019), 900 mg (05/17/2019), 800 mg (05/31/2019) oxaliplatin (ELOXATIN) 190 mg in dextrose 5 % 500 mL chemo infusion, 85 mg/m2 = 190 mg, Intravenous,  Once, 12 of 12 cycles Dose modification: 68 mg/m2 (80 % of original dose 85 mg/m2, Cycle 2, Reason: Provider Judgment), 68 mg/m2 (80 % of original dose 85 mg/m2, Cycle 3, Reason: Provider Judgment) Administration: 190 mg (08/16/2018), 150 mg (09/07/2018), 150  mg (10/19/2018), 150 mg (11/16/2018), 150 mg  (11/02/2018), 135 mg (11/30/2018), 150 mg (03/21/2019), 150 mg (04/04/2019), 150 mg (04/18/2019), 150 mg (05/02/2019), 150 mg (05/17/2019), 130 mg (05/31/2019) fluorouracil (ADRUCIL) chemo injection 700 mg, 320 mg/m2 = 700 mg (100 % of original dose 320 mg/m2), Intravenous,  Once, 9 of 9 cycles Dose modification: 320 mg/m2 (original dose 320 mg/m2, Cycle 4) Administration: 700 mg (11/02/2018), 700 mg (11/16/2018), 650 mg (11/30/2018), 700 mg (03/21/2019), 700 mg (04/04/2019), 700 mg (04/18/2019), 700 mg (05/02/2019), 700 mg (05/17/2019), 700 mg (05/31/2019) fosaprepitant (EMEND) 150 mg, dexamethasone (DECADRON) 12 mg in sodium chloride 0.9 % 145 mL IVPB, , Intravenous,  Once, 12 of 12 cycles Administration:  (08/16/2018),  (09/07/2018),  (10/19/2018),  (11/02/2018),  (11/16/2018),  (11/30/2018),  (03/21/2019),  (04/04/2019),  (04/18/2019),  (05/02/2019),  (05/17/2019),  (05/31/2019) fluorouracil (ADRUCIL) 5,400 mg in sodium chloride 0.9 % 142 mL chemo infusion, 2,400 mg/m2 = 5,400 mg, Intravenous, 1 Day/Dose, 12 of 12 cycles Dose modification: 1,920 mg/m2 (80 % of original dose 2,400 mg/m2, Cycle 3, Reason: Provider Judgment) Administration: 5,400 mg (08/16/2018), 5,400 mg (09/07/2018), 4,300 mg (10/19/2018), 4,300 mg (11/02/2018), 4,300 mg (11/16/2018), 3,850 mg (11/30/2018), 4,300 mg (03/21/2019), 4,300 mg (04/04/2019), 4,300 mg (04/18/2019), 4,300 mg (05/02/2019), 4,300 mg (05/17/2019), 3,850 mg (05/31/2019) bevacizumab-awwb (MVASI) 500 mg in sodium chloride 0.9 % 100 mL chemo infusion, 5 mg/kg = 500 mg (100 % of original dose 5 mg/kg), Intravenous,  Once, 6 of 6 cycles Dose modification: 5 mg/kg (original dose 5 mg/kg, Cycle 1), 5 mg/kg (original dose 5 mg/kg, Cycle 2) Administration: 500 mg (08/16/2018), 500 mg (09/07/2018), 500 mg (10/19/2018), 500 mg (11/02/2018), 450 mg (11/16/2018), 400 mg (11/30/2018)  for chemotherapy treatment.      CANCER STAGING: Cancer Staging Adenocarcinoma of sigmoid colon [facility]) Staging form: Colon and Rectum, AJCC 8th  Edition - Pathologic stage from 04/01/2016: Stage IIA (pT3, pN0, cM0) - Signed by Ellouise Newer, PA-C on 04/01/2016   INTERVAL HISTORY:  Troy Smith, a 65 y.o. 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 [place] was last contacted via telephone on 0/325/2022.   Today he reports feeling well. He denies rectal pain. He has occasion numbness and tingling in medial 3 fingertips.He reports diarrhea while undergoing the previous chemotherapy. He reports that he had no energy or appetite while undergoing previous chemotherapy. He denies current n/v/c but reports occasional diarrhea with imodium prn. He is taking lasix prn for leg swelling. He is taking allopurinol for gout.     REVIEW OF SYSTEMS:  Review of Systems  Constitutional: Negative for appetite change and fatigue.  Cardiovascular: Positive for leg swelling (on Lasix).  Gastrointestinal: Positive for diarrhea (occasional). Negative for constipation, nausea and vomiting.  All other systems reviewed and are negative.   PAST MEDICAL/SURGICAL HISTORY:  Past Medical History:  Diagnosis Date  . Adenocarcinoma of sigmoid colon (HCC) 03/23/2016  . Carpal tunnel syndrome 05/02/2014   Bilateral  . Cervical disc disorder with radiculopathy of cervical region 05/02/2014   Left C8  . Diabetes mellitus without complication (HCC)   . Hypertension   . Port-A-Cath in place 08/10/2018   Past Surgical History:  Procedure Laterality Date  . BIOPSY  03/20/2016   Procedure: BIOPSY;  Surgeon: Malissa Hippo, MD;  Location: [facility];  Service: Endoscopy;;  colon  . BOWEL RESECTION N/A 03/23/2016   Procedure: LOW ANTERIOR RESECTION;  Surgeon: Franky Macho, MD;  Location: [facility] OR[facility];  Service:  General;  Laterality: N/A;  . COLON SURGERY  01/2019   Done at [facility]  . COLONOSCOPY N/A 03/20/2016   Procedure: COLONOSCOPY;  Surgeon: Rogene Houston, MD;  Location: [facility];  Service: Endoscopy;   Laterality: N/A;  730  . COLONOSCOPY N/A 07/10/2016   Procedure: COLONOSCOPY;  Surgeon: Rogene Houston, MD;  Location: [facility];  Service: Endoscopy;  Laterality: N/A;  1040  . NO PAST SURGERIES    . POLYPECTOMY  07/10/2016   Procedure: POLYPECTOMY;  Surgeon: Rogene Houston, MD;  Location: [facility];  Service: Endoscopy;;  colon  . PORTACATH PLACEMENT Left 08/05/2018   Procedure: INSERTION PORT-A-CATH;  Surgeon: Aviva Signs, MD;  Location: [facility];  Service: General;  Laterality: Left;  . TONSILLECTOMY      SOCIAL HISTORY:  Social History   Socioeconomic History  . Marital status: Married    Spouse name: Not on file  . Number of children: Not on file  . Years of education: Not on file  . Highest education level: Not on file  Occupational History  . Not on file  Tobacco Use  . Smoking status: Never Smoker  . Smokeless tobacco: Never Used  Vaping Use  . Vaping Use: Never used  Substance and Sexual Activity  . Alcohol use: Yes    Comment: rarely  . Drug use: No  . Sexual activity: Not on file  Other Topics Concern  . Not on file  Social History Narrative  . Not on file   Social Determinants of Health   Financial Resource Strain: Not on file  Food Insecurity: Not on file  Transportation Needs: No Transportation Needs  . Lack of Transportation (Medical): No  . Lack of Transportation (Non-Medical): No  Physical Activity: Inactive  . Days of Exercise per Week: 0 days  . Minutes of Exercise per Session: 0 min  Stress: Not on file  Social Connections: Not on file  Intimate Partner Violence: Not At Risk  . Fear of Current or Ex-Partner: No  . Emotionally Abused: No  . Physically Abused: No  . Sexually Abused: No    FAMILY HISTORY:  Family History  Problem Relation Age of Onset  . COPD Mother   . Melanoma Father     CURRENT MEDICATIONS:  Current Outpatient Medications  Medication Sig Dispense Refill  . allopurinol (ZYLOPRIM) 300 MG tablet Take 1 tablet  (300 mg total) by mouth daily. 30 tablet 6  . amLODipine (NORVASC) 10 MG tablet Take 10 mg by mouth daily.    . diphenoxylate-atropine (LOMOTIL) 2.5-0.025 MG tablet Take 2 tablets at the first onset of diarrhea followed by 1 tablet after every watery bowel movement. 60 tablet 2  . donepezil (ARICEPT) 10 MG tablet TAKE (1) TABLET BY MOUTH AT BEDTIME. 30 tablet 6  . furosemide (LASIX) 20 MG tablet TAKE (1) TABLET BY MOUTH ONCE DAILY. 30 tablet 6  . insulin glargine (LANTUS) 100 UNIT/ML injection Inject 30 Units into the skin at bedtime.     Marland Kitchen loperamide (IMODIUM A-D) 2 MG tablet Take 2 at onset of diarrhea, then 1 after each watery bowel movement. 100 tablet 1  . magnesium oxide (MAG-OX) 400 (241.3 Mg) MG tablet Take 1 tablet (400 mg total) by mouth 2 (two) times daily. 60 tablet 2  . memantine (NAMENDA) 10 MG tablet Take 1 tablet (10 mg total) by mouth 2 (two) times daily. 60 tablet 5  . metoprolol succinate (TOPROL-XL) 50 MG 24 hr tablet  Take 50 mg by mouth daily. Take with or immediately following a meal.    . scopolamine (TRANSDERM-SCOP) 1 MG/3DAYS Place 1 patch (1.5 mg total) onto the skin every 3 (three) days. 10 patch 12  . prochlorperazine (COMPAZINE) 10 MG tablet Take 1 tablet (10 mg total) by mouth every 6 (six) hours as needed (Nausea or vomiting). (Patient not taking: Reported on 06/17/2020) 60 tablet 1   No current facility-administered medications for this visit.    ALLERGIES:  Allergies  Allergen Reactions  . Penicillin G Nausea And Vomiting    PHYSICAL EXAM:  Performance status (ECOG): 1 - Symptomatic but completely ambulatory  Vitals:   06/17/20 1505  BP: 128/79  Pulse: 64  Resp: 16  Temp: 98.6 F (37 C)  SpO2: 96%   Wt Readings from Last 3 Encounters:  06/17/20 213 lb 8 oz (96.8 kg)  03/27/20 208 lb 12.8 oz (94.7 kg)  03/11/20 207 lb (93.9 kg)   Physical Exam Vitals reviewed.  Constitutional:      Appearance: Normal appearance.  Cardiovascular:     Rate and  Rhythm: Normal rate and regular rhythm.     Pulses: Normal pulses.     Heart sounds: Normal heart sounds.  Pulmonary:     Effort: Pulmonary effort is normal.     Breath sounds: Normal breath sounds.  Chest:  Breasts:     Left: No axillary adenopathy.    Abdominal:     Palpations: Abdomen is soft. There is no hepatomegaly, splenomegaly or mass.     Tenderness: There is no abdominal tenderness.  Musculoskeletal:     Right lower leg: No edema.     Left lower leg: No edema.  Lymphadenopathy:     Upper Body:     Left upper body: No axillary or pectoral adenopathy.  Neurological:     General: No focal deficit present.     Mental Status: He is alert and oriented to person, place, and time.  Psychiatric:        Mood and Affect: Mood normal.        Behavior: Behavior normal.      LABORATORY DATA:  I have reviewed the labs as listed.  CBC Latest Ref Rng & Units 03/20/2020 12/21/2019 09/14/2019  WBC 4.0 - 10.5 K/uL 4.3 5.3 4.9  Hemoglobin 13.0 - 17.0 g/dL 10.9(L) 10.2(L) 10.5(L)  Hematocrit 39.0 - 52.0 % 33.7(L) 32.3(L) 32.4(L)  Platelets 150 - 400 K/uL 146(L) 135(L) 133(L)   CMP Latest Ref Rng & Units 03/20/2020 12/21/2019 09/14/2019  Glucose 70 - 99 mg/dL 152(H) 125(H) 130(H)  BUN 8 - 23 mg/dL 20 21 24(H)  Creatinine 0.61 - 1.24 mg/dL 1.24 1.38(H) 1.48(H)  Sodium 135 - 145 mmol/L 136 140 139  Potassium 3.5 - 5.1 mmol/L 3.5 5.0 3.8  Chloride 98 - 111 mmol/L 105 108 103  CO2 22 - 32 mmol/L 24 25 25   Calcium 8.9 - 10.3 mg/dL 9.2 9.3 9.7  Total Protein 6.5 - 8.1 g/dL 6.6 6.3(L) 6.6  Total Bilirubin 0.3 - 1.2 mg/dL 0.9 0.8 1.1  Alkaline Phos 38 - 126 U/L 87 67 61  AST 15 - 41 U/L 14(L) 12(L) 15  ALT 0 - 44 U/L 12 13 14     DIAGNOSTIC IMAGING:  I have independently reviewed the scans and discussed with the patient. No results found.   ASSESSMENT:  1. 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 -Single copy UG T1  A1*28 allele indicating increased risk of neutropenia and other side effects from Irinotecan. -Laparoscopic partial hepatectomy, redo low anterior resection on 02/01/2019, pathology showing YPT3Y PN 0. Positive circumferential/radial/mesenteric margin. -6 more cycles of FOLFOX completed on 05/31/2019. -Xeloda with radiation from 06/26/2019 through 08/03/2019. -CT CAP on 09/14/2019 shows postoperative changes in the rectosigmoid resection and reanastomosis with interval increase in posttreatment perirectal and presacral soft tissue thickening consistent with radiation changes. No adenopathy. Multiple stable small bilateral pulmonary nodules, likely benign. No new evidence of metastatic disease. Unchanged splenomegaly. - PET scan on 04/08/2020 done secondary to increase in CEA shows abnormal hypermetabolic soft tissue in the presacral region suspicious for recurrence.  Sclerosis involving sacrum.  Small left axillary lymph nodes favored to be reactive. - MRI pelvis on 04/26/2020 with enhancing lesion involving S1 segment of the sacrum concerning for metastatic disease.  Peripherally enhancing soft tissue nodule within the presacral soft tissues measuring 2.4 x 2.1 cm.   PLAN:  1. Metastatic sigmoid colon cancer to the liver: -He was evaluated by Dr. Crisoforo Oxford and a musculoskeletal surgeon at [facility]. - He was recommended against surgery because of the morbidity of the procedure. - He was evaluated by Dr. Oneita Kras from radiation oncology at [facility].  She has recommended few cycles of chemotherapy with rescanning in 2 to 3 months. - We discussed the chemotherapy regimen with FOLFOX and likely bevacizumab. - He had poor tolerance to Irinotecan secondary to UG T1 A1 single copy allele.  Hence I would avoid Irinotecan due to toxicity with cycle 1. - We talked about side effects of FOLFOX in detail. - We will obtain a baseline CT CAP since the last scan was done 2 and half months ago.  We will  also obtain a baseline CEA level. - Tentative we will start FOLFOX next week.  2. Diarrhea: -He has intermittent diarrhea.  Continue Imodium as needed.  3. Hypomagnesemia: -Continue magnesium twice daily.  4. Memory loss: -Continue Aricept and Namenda.  5. Gout: -Continue allopurinol 300 mg daily for prophylaxis.  6. CKD: -Creatinine today is at 1.47.  7. Lower extremity swelling: -He has 1+ edema in the legs.  Albumin is 4.2. - Continue Lasix 20 mg as needed.   Orders placed this encounter:  No orders of the defined types were placed in this encounter.    Derek Jack, MD [place] [phone]   I, Milinda Antis, am acting as a scribe for Dr. Sanda Linger.  I, Derek Jack MD, have reviewed the above documentation for accuracy and completeness, and I agree with the above.

## 2020-06-18 LAB — CEA: CEA: 126 ng/mL — ABNORMAL HIGH (ref 0.0–4.7)

## 2020-06-20 ENCOUNTER — Other Ambulatory Visit (HOSPITAL_COMMUNITY): Payer: Self-pay | Admitting: *Deleted

## 2020-06-20 DIAGNOSIS — C187 Malignant neoplasm of sigmoid colon: Secondary | ICD-10-CM

## 2020-06-21 ENCOUNTER — Ambulatory Visit (HOSPITAL_BASED_OUTPATIENT_CLINIC_OR_DEPARTMENT_OTHER): Payer: BC Managed Care – PPO

## 2020-06-26 ENCOUNTER — Inpatient Hospital Stay (HOSPITAL_COMMUNITY): Payer: BC Managed Care – PPO

## 2020-06-26 ENCOUNTER — Other Ambulatory Visit: Payer: Self-pay

## 2020-06-26 ENCOUNTER — Encounter (HOSPITAL_COMMUNITY): Payer: Self-pay

## 2020-06-26 ENCOUNTER — Other Ambulatory Visit (HOSPITAL_COMMUNITY): Payer: Self-pay

## 2020-06-26 VITALS — BP 152/87 | HR 60 | Temp 97.0°F | Resp 18

## 2020-06-26 VITALS — BP 134/80 | HR 75 | Temp 97.3°F | Resp 20 | Wt 209.4 lb

## 2020-06-26 DIAGNOSIS — C187 Malignant neoplasm of sigmoid colon: Secondary | ICD-10-CM

## 2020-06-26 DIAGNOSIS — Z5111 Encounter for antineoplastic chemotherapy: Secondary | ICD-10-CM | POA: Diagnosis not present

## 2020-06-26 DIAGNOSIS — Z95828 Presence of other vascular implants and grafts: Secondary | ICD-10-CM

## 2020-06-26 LAB — CBC WITH DIFFERENTIAL/PLATELET
Abs Immature Granulocytes: 0.02 10*3/uL (ref 0.00–0.07)
Basophils Absolute: 0 10*3/uL (ref 0.0–0.1)
Basophils Relative: 1 %
Eosinophils Absolute: 0.2 10*3/uL (ref 0.0–0.5)
Eosinophils Relative: 5 %
HCT: 33.1 % — ABNORMAL LOW (ref 39.0–52.0)
Hemoglobin: 10.8 g/dL — ABNORMAL LOW (ref 13.0–17.0)
Immature Granulocytes: 1 %
Lymphocytes Relative: 17 %
Lymphs Abs: 0.7 10*3/uL (ref 0.7–4.0)
MCH: 29.2 pg (ref 26.0–34.0)
MCHC: 32.6 g/dL (ref 30.0–36.0)
MCV: 89.5 fL (ref 80.0–100.0)
Monocytes Absolute: 0.4 10*3/uL (ref 0.1–1.0)
Monocytes Relative: 8 %
Neutro Abs: 3.1 10*3/uL (ref 1.7–7.7)
Neutrophils Relative %: 68 %
Platelets: 141 10*3/uL — ABNORMAL LOW (ref 150–400)
RBC: 3.7 MIL/uL — ABNORMAL LOW (ref 4.22–5.81)
RDW: 14.1 % (ref 11.5–15.5)
WBC: 4.4 10*3/uL (ref 4.0–10.5)
nRBC: 0 % (ref 0.0–0.2)

## 2020-06-26 LAB — COMPREHENSIVE METABOLIC PANEL
ALT: 13 U/L (ref 0–44)
AST: 14 U/L — ABNORMAL LOW (ref 15–41)
Albumin: 3.8 g/dL (ref 3.5–5.0)
Alkaline Phosphatase: 124 U/L (ref 38–126)
Anion gap: 8 (ref 5–15)
BUN: 29 mg/dL — ABNORMAL HIGH (ref 8–23)
CO2: 25 mmol/L (ref 22–32)
Calcium: 8.9 mg/dL (ref 8.9–10.3)
Chloride: 105 mmol/L (ref 98–111)
Creatinine, Ser: 1.47 mg/dL — ABNORMAL HIGH (ref 0.61–1.24)
GFR, Estimated: 53 mL/min — ABNORMAL LOW (ref >60)
Glucose, Bld: 136 mg/dL — ABNORMAL HIGH (ref 70–99)
Potassium: 4 mmol/L (ref 3.5–5.1)
Sodium: 138 mmol/L (ref 135–145)
Total Bilirubin: 0.9 mg/dL (ref 0.3–1.2)
Total Protein: 6.6 g/dL (ref 6.5–8.1)

## 2020-06-26 MED ORDER — SODIUM CHLORIDE 0.9 % IV SOLN
Freq: Once | INTRAVENOUS | Status: AC
Start: 2020-06-26 — End: 2020-06-26
  Filled 2020-06-26: qty 150

## 2020-06-26 MED ORDER — ATROPINE SULFATE 1 MG/ML IJ SOLN
0.5000 mg | Freq: Once | INTRAMUSCULAR | Status: AC
  Administered 2020-06-26: 0.5 mg via INTRAVENOUS
  Filled 2020-06-26: qty 1

## 2020-06-26 MED ORDER — SODIUM CHLORIDE 0.9% FLUSH: 10.0000 mL | INTRAVENOUS | Status: DC | PRN

## 2020-06-26 MED ORDER — OXALIPLATIN CHEMO INJECTION 100 MG/20ML
67.0000 mg/m2 | Freq: Once | INTRAVENOUS | Status: AC
  Administered 2020-06-26: 145 mg via INTRAVENOUS
  Filled 2020-06-26: qty 20

## 2020-06-26 MED ORDER — DEXTROSE 5 % IV SOLN: Freq: Once | INTRAVENOUS | Status: AC

## 2020-06-26 MED ORDER — FLUOROURACIL CHEMO INJECTION 2.5 GM/50ML
320.0000 mg/m2 | Freq: Once | INTRAVENOUS | Status: AC
  Administered 2020-06-26: 700 mg via INTRAVENOUS
  Filled 2020-06-26: qty 14

## 2020-06-26 MED ORDER — DEXTROSE 5 % IV SOLN
Freq: Once | INTRAVENOUS | Status: AC
Start: 2020-06-26 — End: 2020-06-26

## 2020-06-26 MED ORDER — SODIUM CHLORIDE 0.9 % IV SOLN
1920.0000 mg/m2 | INTRAVENOUS | Status: DC
  Administered 2020-06-26: 4200 mg via INTRAVENOUS
  Filled 2020-06-26: qty 84

## 2020-06-26 MED ORDER — PALONOSETRON HCL INJECTION 0.25 MG/5ML
0.2500 mg | Freq: Once | INTRAVENOUS | Status: AC
  Administered 2020-06-26: 0.25 mg via INTRAVENOUS
  Filled 2020-06-26: qty 5

## 2020-06-26 MED ORDER — DEXTROSE 5 % IV SOLN
400.0000 mg/m2 | Freq: Once | INTRAVENOUS | Status: AC
  Administered 2020-06-26: 900 mg via INTRAVENOUS
  Filled 2020-06-26: qty 45

## 2020-06-26 MED ORDER — SODIUM CHLORIDE 0.9 % IV SOLN
10.0000 mg | Freq: Once | INTRAVENOUS | Status: AC
  Administered 2020-06-26: 10 mg via INTRAVENOUS
  Filled 2020-06-26: qty 10

## 2020-06-26 NOTE — Progress Notes (Signed)
Patient tolerated chemotherapy with no complaints voiced.  Side effects with management reviewed with understanding verbalized.  Port site clean and dry with no bruising or swelling noted at site.  Good blood return noted before and after administration of chemotherapy.  Chemotherapy pump connected with no alarms noted.   Patient left in satisfactory condition with VSS and no s/s of distress noted.  

## 2020-06-26 NOTE — Patient Instructions (Signed)
[facility]  Discharge Instructions: Thank you for choosing [facility] to provide your oncology and hematology care.  If you have a lab appointment with the [facility], please come in thru the Main Entrance and check in at the main information desk.  Wear comfortable clothing and clothing appropriate for easy access to any Portacath or PICC line.   We strive to give you quality time with your provider. You may need to reschedule your appointment if you arrive late (15 or more minutes).  Arriving late affects you and other patients whose appointments are after yours.  Also, if you miss three or more appointments without notifying the office, you may be dismissed from the clinic at the provider's discretion.      For prescription refill requests, have your pharmacy contact our office and allow 72 hours for refills to be completed.    Today you received the following chemotherapy and/or immunotherapy agents Oxaliplatin, Leucovorin, Adruicil.       To help prevent nausea and vomiting after your treatment, we encourage you to take your nausea medication as directed.  BELOW ARE SYMPTOMS THAT SHOULD BE REPORTED IMMEDIATELY: . *FEVER GREATER THAN 100.4 F (38 C) OR HIGHER . *CHILLS OR SWEATING . *NAUSEA AND VOMITING THAT IS NOT CONTROLLED WITH YOUR NAUSEA MEDICATION . *UNUSUAL SHORTNESS OF BREATH . *UNUSUAL BRUISING OR BLEEDING . *URINARY PROBLEMS (pain or burning when urinating, or frequent urination) . *BOWEL PROBLEMS (unusual diarrhea, constipation, pain near the anus) . TENDERNESS IN MOUTH AND THROAT WITH OR WITHOUT PRESENCE OF ULCERS (sore throat, sores in mouth, or a toothache) . UNUSUAL RASH, SWELLING OR PAIN  . UNUSUAL VAGINAL DISCHARGE OR ITCHING   Items with * indicate a potential emergency and should be followed up as soon as possible or go to the Emergency Department if any problems should occur.  Please show the CHEMOTHERAPY ALERT CARD or IMMUNOTHERAPY  ALERT CARD at check-in to the Emergency Department and triage nurse.  Should you have questions after your visit or need to cancel or reschedule your appointment, please contact [facility] [phone]  and follow the prompts.  Office hours are 8:00 a.m. to 4:30 p.m. Monday - Friday. Please note that voicemails left after 4:00 p.m. may not be returned until the following business day.  We are closed weekends and major holidays. You have access to a nurse at all times for urgent questions. Please call the main number to the clinic [phone] and follow the prompts.  For any non-urgent questions, you may also contact your provider using MyChart. We now offer e-Visits for anyone 80 and older to request care online for non-urgent symptoms. For details visit [email or web address].   Also download the MyChart app! Go to the app store, search "MyChart", open the app, select [facility], and log in with your MyChart username and password.  Due to Covid, a mask is required upon entering the hospital/clinic. If you do not have a mask, one will be given to you upon arrival. For doctor visits, patients may have 1 support person aged 52 or older with them. For treatment visits, patients cannot have anyone with them due to current Covid guidelines and our immunocompromised population.

## 2020-06-26 NOTE — Progress Notes (Signed)
PA approved per Lendell Caprice.  Henreitta Leber, PharmD

## 2020-06-28 ENCOUNTER — Inpatient Hospital Stay (HOSPITAL_COMMUNITY): Payer: BC Managed Care – PPO

## 2020-06-28 ENCOUNTER — Other Ambulatory Visit: Payer: Self-pay

## 2020-06-28 VITALS — BP 114/71 | HR 61 | Temp 97.6°F | Resp 18

## 2020-06-28 DIAGNOSIS — Z5111 Encounter for antineoplastic chemotherapy: Secondary | ICD-10-CM | POA: Diagnosis not present

## 2020-06-28 DIAGNOSIS — Z95828 Presence of other vascular implants and grafts: Secondary | ICD-10-CM

## 2020-06-28 DIAGNOSIS — C187 Malignant neoplasm of sigmoid colon: Secondary | ICD-10-CM

## 2020-06-28 MED ORDER — SODIUM CHLORIDE 0.9% FLUSH
10.0000 mL | INTRAVENOUS | Status: DC | PRN
  Administered 2020-06-28: 10 mL

## 2020-06-28 MED ORDER — PEGFILGRASTIM-CBQV 6 MG/0.6ML ~~LOC~~ SOSY
PREFILLED_SYRINGE | SUBCUTANEOUS | Status: AC
  Filled 2020-06-28: qty 0.6

## 2020-06-28 MED ORDER — PEGFILGRASTIM-CBQV 6 MG/0.6ML ~~LOC~~ SOSY
6.0000 mg | PREFILLED_SYRINGE | Freq: Once | SUBCUTANEOUS | Status: AC
  Administered 2020-06-28: 6 mg via SUBCUTANEOUS

## 2020-06-28 MED ORDER — HEPARIN SOD (PORK) LOCK FLUSH 100 UNIT/ML IV SOLN
500.0000 [IU] | Freq: Once | INTRAVENOUS | Status: AC | PRN
  Administered 2020-06-28: 500 [IU]

## 2020-06-28 NOTE — Patient Instructions (Signed)
[facility]  Discharge Instructions: Thank you for choosing [facility] to provide your oncology and hematology care.  If you have a lab appointment with the [facility], please come in thru the Main Entrance and check in at the main information desk.  Wear comfortable clothing and clothing appropriate for easy access to any Portacath or PICC line.   We strive to give you quality time with your provider. You may need to reschedule your appointment if you arrive late (15 or more minutes).  Arriving late affects you and other patients whose appointments are after yours.  Also, if you miss three or more appointments without notifying the office, you may be dismissed from the clinic at the provider's discretion.      For prescription refill requests, have your pharmacy contact our office and allow 72 hours for refills to be completed.    Today your pump was deaccessed and you received the following chemotherapy and/or immunotherapy agents Udenyca.   To help prevent nausea and vomiting after your treatment, we encourage you to take your nausea medication as directed.  BELOW ARE SYMPTOMS THAT SHOULD BE REPORTED IMMEDIATELY: . *FEVER GREATER THAN 100.4 F (38 C) OR HIGHER . *CHILLS OR SWEATING . *NAUSEA AND VOMITING THAT IS NOT CONTROLLED WITH YOUR NAUSEA MEDICATION . *UNUSUAL SHORTNESS OF BREATH . *UNUSUAL BRUISING OR BLEEDING . *URINARY PROBLEMS (pain or burning when urinating, or frequent urination) . *BOWEL PROBLEMS (unusual diarrhea, constipation, pain near the anus) . TENDERNESS IN MOUTH AND THROAT WITH OR WITHOUT PRESENCE OF ULCERS (sore throat, sores in mouth, or a toothache) . UNUSUAL RASH, SWELLING OR PAIN  . UNUSUAL VAGINAL DISCHARGE OR ITCHING   Items with * indicate a potential emergency and should be followed up as soon as possible or go to the Emergency Department if any problems should occur.  Please show the CHEMOTHERAPY ALERT CARD or IMMUNOTHERAPY  ALERT CARD at check-in to the Emergency Department and triage nurse.  Should you have questions after your visit or need to cancel or reschedule your appointment, please contact [facility] [phone]  and follow the prompts.  Office hours are 8:00 a.m. to 4:30 p.m. Monday - Friday. Please note that voicemails left after 4:00 p.m. may not be returned until the following business day.  We are closed weekends and major holidays. You have access to a nurse at all times for urgent questions. Please call the main number to the clinic [phone] and follow the prompts.  For any non-urgent questions, you may also contact your provider using MyChart. We now offer e-Visits for anyone 77 and older to request care online for non-urgent symptoms. For details visit [email or web address].   Also download the MyChart app! Go to the app store, search "MyChart", open the app, select [facility], and log in with your MyChart username and password.  Due to Covid, a mask is required upon entering the hospital/clinic. If you do not have a mask, one will be given to you upon arrival. For doctor visits, patients may have 1 support person aged 88 or older with them. For treatment visits, patients cannot have anyone with them due to current Covid guidelines and our immunocompromised population.

## 2020-06-28 NOTE — Progress Notes (Signed)
Patient tolerated injection with no complaints voiced. Site clean and dry with no bruising or swelling noted at site. See MAR for details. Band aid applied.  Patient stable during and after injection.  Port deaccessed. Port flushed with good blood return noted. No bruising or swelling at site. Bandaid applied and patient discharged in satisfactory condition. VVS stable with no signs or symptoms of distressed noted.

## 2020-07-01 ENCOUNTER — Other Ambulatory Visit: Payer: Self-pay

## 2020-07-01 ENCOUNTER — Inpatient Hospital Stay (HOSPITAL_COMMUNITY): Payer: BC Managed Care – PPO

## 2020-07-01 ENCOUNTER — Ambulatory Visit (HOSPITAL_COMMUNITY)
Admission: RE | Admit: 2020-07-01 | Discharge: 2020-07-01 | Disposition: A | Discharge status: Home / Self Care | Payer: BC Managed Care – PPO | Source: Ambulatory Visit | Attending: Hematology | Admitting: Hematology

## 2020-07-01 DIAGNOSIS — C187 Malignant neoplasm of sigmoid colon: Secondary | ICD-10-CM | POA: Insufficient documentation

## 2020-07-01 MED ORDER — FLUDEOXYGLUCOSE F - 18 (FDG) INJECTION
12.2400 | Freq: Once | INTRAVENOUS | Status: AC | PRN
  Administered 2020-07-01: 12.24 via INTRAVENOUS

## 2020-07-03 ENCOUNTER — Encounter (HOSPITAL_COMMUNITY): Payer: Self-pay

## 2020-07-03 NOTE — Telephone Encounter (Signed)
Would you like to order something else?

## 2020-07-04 ENCOUNTER — Other Ambulatory Visit (HOSPITAL_COMMUNITY): Payer: Self-pay | Admitting: *Deleted

## 2020-07-04 MED ORDER — DIPHENOXYLATE-ATROPINE 2.5-0.025 MG PO TABS: 2.0000 | ORAL_TABLET | Freq: Four times a day (QID) | ORAL | 3 refills | Status: DC | PRN

## 2020-07-04 MED ORDER — METOCLOPRAMIDE HCL 10 MG PO TABS: 10.0000 mg | ORAL_TABLET | Freq: Four times a day (QID) | ORAL | 3 refills | Status: DC | PRN

## 2020-07-09 NOTE — Progress Notes (Signed)
[facility] [address], [place] [address]   CLINIC:  Medical Oncology/Hematology  PCP:  [name], [place] / [place] [address] [phone]   REASON FOR VISIT:  Follow-up for metastatic colon cancer with local recurrence  PRIOR THERAPY: Chemotherapy followed by chemoradiation  NGS Results: not done  CURRENT THERAPY: FOLFOX every 2 weeks  BRIEF ONCOLOGIC HISTORY:  Oncology History  Adenocarcinoma of sigmoid colon ([facility])  11/20/2015 Imaging   CT abd/pelvis- Focal sigmoid diverticulitis is noted without abscess formation. Focal narrowing and wall thickening of the sigmoid colon is noted in this area most likely due to inflammation, but neoplasm cannot be excluded, and sigmoidoscopy is recommended for further evaluation.   03/20/2016 Procedure   Colonoscopy by Dr. Laural Golden- Malignant partially obstructing tumor in the distal sigmoid colon. Biopsied. - Incomplete exam.   03/23/2016 Pathology Results   Colon, biopsy, sigmoid mass - ADENOCARCINOMA.   03/23/2016 Procedure   Low anterior resection with transanal anastomosis   03/26/2016 Pathology Results   Colon, segmental resection for tumor, sigmoid - INVASIVE WELL DIFFERENTIATED ADENOCARCINOMA, SPANNING 5 CM IN GREATEST DIMENSION. - TUMOR INVADES THROUGH MUSCULARIS PROPRIA TO INVOLVE SUBSEROSAL SOFT TISSUES. - UNDERLYING MARKED ACUTE AND CHRONIC INFLAMMATION WITH GIANT CELL REACTION AND ABSCESS FORMATION EXTENDING TO THE SEROSAL SURFACE AND INVOLVING THE MESENTERIC TISSUE. - MARGINS ARE NEGATIVE FOR TUMOR. - FOURTEEN BENIGN LYMPH NODES WHICH DEMONSTRATE VARIABLE INVOLVEMENT WITH ACUTE AND CHRONIC INFLAMMATION WITH ASSOCIATED GIANT CELLS WITHOUT TUMOR IDENTIFIED (0/14).   04/08/2016 Cancer Staging   Cancer Staging Adenocarcinoma of sigmoid colon [facility]) Staging form: Colon and Rectum, AJCC 8th Edition - Pathologic stage from 04/01/2016: Stage IIA (pT3, pN0, cM0) - Signed by Baird Cancer, PA-C on 04/01/2016    07/09/2016 Imaging   CT abd/pelvis- No evidence of metastatic disease or other acute findings.  Bilateral nonobstructing renal calculi and mild bilateral renal parenchymal scarring.  Stable mildly enlarged prostate gland and findings of chronic bladder outlet obstruction.   08/16/2018 -  Chemotherapy    Patient is on Treatment Plan: COLORECTAL FOLFOXIRI         CANCER STAGING: Cancer Staging Adenocarcinoma of sigmoid colon [facility]) Staging form: Colon and Rectum, AJCC 8th Edition - Pathologic stage from 04/01/2016: Stage IIA (pT3, pN0, cM0) - Signed by Baird Cancer, PA-C on 04/01/2016   INTERVAL HISTORY:  Troy Smith, a 65 y.o. male, returns for routine follow-up and consideration for next cycle of chemotherapy. Troy Smith was last seen on 06/27/2020.  Due for cycle #14 of FOLFOX today.   Overall, he tells me he has been feeling pretty well. Today he reports feeling well. He has had watery diarrhea constantly beginning after the removal of his pump; it occurs 2-3 times daily. There has been no blood in the stool. He has taken imodium daily as well as Lomodil: 2 pills 3-4x daily which has helped. He also felt nausea following his last treatment which was resolved with Reglan. He denies cold-sensitivity and tingling/numbness in hands and feet. There has been a slight decrease in energy, but his appetite is good. He has had no abdominal pain or cramping. He is no longer taking Lasix as he has not needed it.   Overall, he feels ready for next cycle of chemo today.   REVIEW OF SYSTEMS:  Review of Systems  Constitutional: Negative for appetite change and fatigue.  Gastrointestinal: Positive for diarrhea and nausea. Negative for abdominal pain and blood in stool.  Neurological: Negative for numbness.  All other systems reviewed and are negative.   PAST MEDICAL/SURGICAL HISTORY:  Past Medical History:  Diagnosis Date  . Adenocarcinoma of sigmoid  colon ([place]) 03/23/2016  . Carpal tunnel syndrome 05/02/2014   Bilateral  . Cervical disc disorder with radiculopathy of cervical region 05/02/2014   Left C8  . Diabetes mellitus without complication ([facility])   . Hypertension   . Port-A-Cath in place 08/10/2018   Past Surgical History:  Procedure Laterality Date  . BIOPSY  03/20/2016   Procedure: BIOPSY;  Surgeon: Rogene Houston, MD;  Location[facility];  Service: Endoscopy;;  colon  . BOWEL RESECTION N/A 03/23/2016   Procedure: LOW ANTERIOR RESECTION;  Surgeon: Aviva Signs, MD;  Location: [facility] ORS;  Service: General;  Laterality: N/A;  . COLON SURGERY  01/2019   Done at [facility]  . COLONOSCOPY N/A 03/20/2016   Procedure: COLONOSCOPY;  Surgeon: Rogene Houston, MD;  Location: [facility];  Service: Endoscopy;  Laterality: N/A;  730  . COLONOSCOPY N/A 07/10/2016   Procedure: COLONOSCOPY;  Surgeon: Rogene Houston, MD;  Location: [facility];  Service: Endoscopy;  Laterality: N/A;  1040  . NO PAST SURGERIES    . POLYPECTOMY  07/10/2016   Procedure: POLYPECTOMY;  Surgeon: Rogene Houston, MD;  Location: [facility];  Service: Endoscopy;;  colon  . PORTACATH PLACEMENT Left 08/05/2018   Procedure: INSERTION PORT-A-CATH;  Surgeon: Aviva Signs, MD;  Location: [facility] ORS;  Service: General;  Laterality: Left;  . TONSILLECTOMY      SOCIAL HISTORY:  Social History   Socioeconomic History  . Marital status: Married    Spouse name: Not on file  . Number of children: Not on file  . Years of education: Not on file  . Highest education level: Not on file  Occupational History  . Not on file  Tobacco Use  . Smoking status: Never Smoker  . Smokeless tobacco: Never Used  Vaping Use  . Vaping Use: Never used  Substance and Sexual Activity  . Alcohol use: Yes    Comment: rarely  . Drug use: No  . Sexual activity: Not on file  Other Topics Concern  . Not on file  Social History Narrative  . Not on file   Social Determinants of Health    Financial Resource Strain: Not on file  Food Insecurity: Not on file  Transportation Needs: No Transportation Needs  . Lack of Transportation (Medical): No  . Lack of Transportation (Non-Medical): No  Physical Activity: Inactive  . Days of Exercise per Week: 0 days  . Minutes of Exercise per Session: 0 min  Stress: Not on file  Social Connections: Not on file  Intimate Partner Violence: Not At Risk  . Fear of Current or Ex-Partner: No  . Emotionally Abused: No  . Physically Abused: No  . Sexually Abused: No    FAMILY HISTORY:  Family History  Problem Relation Age of Onset  . COPD Mother   . Melanoma Father     CURRENT MEDICATIONS:  Current Outpatient Medications  Medication Sig Dispense Refill  . allopurinol (ZYLOPRIM) 300 MG tablet Take 1 tablet (300 mg total) by mouth daily. 30 tablet 6  . amLODipine (NORVASC) 10 MG tablet Take 10 mg by mouth daily.    . diphenoxylate-atropine (LOMOTIL) 2.5-0.025 MG tablet Take 2 tablets by mouth 4 (four) times daily as needed for diarrhea or loose stools. 240 tablet 3  . donepezil (ARICEPT) 10  MG tablet TAKE (1) TABLET BY MOUTH AT BEDTIME. 30 tablet 6  . furosemide (LASIX) 20 MG tablet TAKE (1) TABLET BY MOUTH ONCE DAILY. 30 tablet 6  . insulin glargine (LANTUS) 100 UNIT/ML injection Inject 30 Units into the skin at bedtime.     Marland Kitchen loperamide (IMODIUM A-D) 2 MG tablet Take 2 at onset of diarrhea, then 1 after each watery bowel movement. 100 tablet 1  . magnesium oxide (MAG-OX) 400 (241.3 Mg) MG tablet Take 1 tablet (400 mg total) by mouth 2 (two) times daily. 60 tablet 2  . memantine (NAMENDA) 10 MG tablet Take 1 tablet (10 mg total) by mouth 2 (two) times daily. 60 tablet 5  . metoCLOPramide (REGLAN) 10 MG tablet Take 1 tablet (10 mg total) by mouth every 6 (six) hours as needed for nausea. 120 tablet 3  . metoprolol succinate (TOPROL-XL) 50 MG 24 hr tablet Take 50 mg by mouth daily. Take with or immediately following a meal.    .  prochlorperazine (COMPAZINE) 10 MG tablet Take 1 tablet (10 mg total) by mouth every 6 (six) hours as needed (Nausea or vomiting). 60 tablet 1  . scopolamine (TRANSDERM-SCOP) 1 MG/3DAYS Place 1 patch (1.5 mg total) onto the skin every 3 (three) days. 10 patch 12   No current facility-administered medications for this visit.    ALLERGIES:  Allergies  Allergen Reactions  . Penicillin G Nausea And Vomiting    PHYSICAL EXAM:  Performance status (ECOG): 1 - Symptomatic but completely ambulatory  There were no vitals filed for this visit. Wt Readings from Last 3 Encounters:  06/26/20 209 lb 6.4 oz (95 kg)  06/17/20 213 lb 8 oz (96.8 kg)  03/27/20 208 lb 12.8 oz (94.7 kg)   Physical Exam Vitals reviewed.  Constitutional:      Appearance: Normal appearance.  Cardiovascular:     Rate and Rhythm: Normal rate and regular rhythm.     Pulses: Normal pulses.     Heart sounds: Normal heart sounds.  Pulmonary:     Effort: Pulmonary effort is normal.     Breath sounds: Normal breath sounds.  Musculoskeletal:     Right lower leg: No edema.     Left lower leg: No edema.  Skin:    General: Skin is dry.  Neurological:     General: No focal deficit present.     Mental Status: He is alert and oriented to person, place, and time.  Psychiatric:        Mood and Affect: Mood normal.        Behavior: Behavior normal.     LABORATORY DATA:  I have reviewed the labs as listed.  CBC Latest Ref Rng & Units 06/26/2020 06/17/2020 03/20/2020  WBC 4.0 - 10.5 K/uL 4.4 6.4 4.3  Hemoglobin 13.0 - 17.0 g/dL 10.8(L) 11.4(L) 10.9(L)  Hematocrit 39.0 - 52.0 % 33.1(L) 35.6(L) 33.7(L)  Platelets 150 - 400 K/uL 141(L) 159 146(L)   CMP Latest Ref Rng & Units 06/26/2020 06/17/2020 03/20/2020  Glucose 70 - 99 mg/dL 136(H) 125(H) 152(H)  BUN 8 - 23 mg/dL 29(H) 22 20  Creatinine 0.61 - 1.24 mg/dL 1.47(H) 1.47(H) 1.24  Sodium 135 - 145 mmol/L 138 139 136  Potassium 3.5 - 5.1 mmol/L 4.0 4.5 3.5  Chloride 98 - 111 mmol/L  105 107 105  CO2 22 - 32 mmol/L 25 26 24   Calcium 8.9 - 10.3 mg/dL 8.9 9.6 9.2  Total Protein 6.5 - 8.1 g/dL 6.6 7.0 6.6  Total Bilirubin 0.3 - 1.2 mg/dL 0.9 1.0 0.9  Alkaline Phos 38 - 126 U/L 124 132(H) 87  AST 15 - 41 U/L 14(L) 14(L) 14(L)  ALT 0 - 44 U/L 13 12 12     DIAGNOSTIC IMAGING:  I have independently reviewed the scans and discussed with the patient. NM PET Image Restag (PS) Skull Base To Thigh  Result Date: 07/03/2020 CLINICAL DATA:  Subsequent treatment strategy for colon cancer. EXAM: NUCLEAR MEDICINE PET SKULL BASE TO THIGH TECHNIQUE: 12.2 mCi F-18 FDG was injected intravenously. Full-ring PET imaging was performed from the skull base to thigh after the radiotracer. CT data was obtained and used for attenuation correction and anatomic localization. Fasting blood glucose: 142 mg/dl COMPARISON:  04/08/2020 FINDINGS: Mediastinal blood pool activity: SUV max 2.7 Liver activity: SUV max NA NECK: No hypermetabolic lymph nodes in the neck. Incidental CT findings: none CHEST: No hypermetabolic mediastinal or hilar nodes. No suspicious pulmonary nodules on the CT scan. Incidental CT findings: Coronary artery calcification is evident. Atherosclerotic calcification is noted in the wall of the thoracic aorta. Left Port-A-Cath tip is in the mid SVC. ABDOMEN/PELVIS: No abnormal hypermetabolic activity within the liver, pancreas, adrenal glands, or spleen. No hypermetabolic lymph nodes in the abdomen or pelvis. The hypermetabolic presacral seen previously has increased in size in the interval measuring 3.6 x 3.1 cm today compared to 2.4 x 2.3 cm previously. Lesion shows SUV max = 8 today compared to 5.4 previously. Destructive changes in the sacrum persist and remain markedly hypermetabolic with SUV max = 60.4 today compared to 6.4 previously. Incidental CT findings: Bladder is markedly distended with multiple stones in the dependent lumen. Mass-effect on the bladder base secondary to median lobe  hypertrophy is similar in appearance. SKELETON: There is diffuse FDG accumulation in the bony anatomy, presumably secondary to marrow stimulatory effects. Incidental CT findings: none IMPRESSION: 1. Previously identified hypermetabolic soft tissue lesion in the presacral space shows interval progression, concerning for progression of recurrent disease. 2. Destructive changes in the sacrum also appear mildly progressive with increasing hypermetabolism. 3. Interval development of diffuse marrow uptake on today's study, presumably secondary to stimulatory effects of therapy. Electronically Signed   By: Misty Stanley M.D.   On: 07/03/2020 11:25     ASSESSMENT:  1. 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 -Laparoscopic partial hepatectomy, redo low anterior resection on 02/01/2019, pathology showing YPT3Y PN 0. Positive circumferential/radial/mesenteric margin. -6 more cycles of FOLFOX completed on 05/31/2019. -Xeloda with radiation from 06/26/2019 through 08/03/2019. -CT CAP on 09/14/2019 shows postoperative changes in the rectosigmoid resection and reanastomosis with interval increase in posttreatment perirectal and presacral soft tissue thickening consistent with radiation changes. No adenopathy. Multiple stable small bilateral pulmonary nodules, likely benign. No new evidence of metastatic disease. Unchanged splenomegaly. - PET scan on 04/08/2020 done secondary to increase in CEA shows abnormal hypermetabolic soft tissue in the presacral region suspicious for recurrence.  Sclerosis involving sacrum.  Small left axillary lymph nodes favored to be reactive. - MRI pelvis on 04/26/2020 with enhancing lesion involving S1 segment of the sacrum concerning for metastatic disease.  Peripherally enhancing soft tissue nodule within the  presacral soft tissues measuring 2.4 x 2.1 cm. - PET scan on 07/01/2020 showed hypermetabolic soft tissue lesion in the presacral space shows interval progression.  Destructive changes in the sacrum also mildly  progressive.  Interval development of diffuse marrow uptake, likely from growth factor. - I talked to Dr. Oneita Kras from radiation oncology at [facility].  She has recommended 2 to 3 months of chemotherapy followed by repeat pelvic MRI.   PLAN:  1. 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 - After pump was discontinued, he had diarrhea 2-3 times per day, watery with no blood.  Lomotil helped when he was taking it 2 tablets 4 times daily. - He had some nausea which helped with Reglan. - He denied any cold sensitivity or numbness.  He had some fatigue. - Reviewed his labs today which showed platelets are 124.  White count and hemoglobin is adequate.  Creatinine increased to 1.97.  Potassium is low at 3.2. - We will recommend 1 L of fluid with electrolytes over 2 hours.  He will proceed with cycle 2 today at the same dose. - We will also give him final normal saline on Friday when he comes back to discontinue pump.  RTC 2 weeks for follow-up.  2. Diarrhea: -Continue Lomotil 2 tablets 4 times daily.  3. Hypomagnesemia: -Continue magnesium twice daily.  4. Memory loss: -Continue Aricept and Namenda.  5. Gout: -Continue allopurinol 300 mg daily for prophylaxis.  6. CKD: -Creatinine increased to 1.97.  We will give him some fluids.  7. Lower extremity swelling: -He has trace edema in the legs.  He is not requiring Lasix.   Orders placed this encounter:  No orders of the defined types were placed in this encounter.    Derek Jack, MD [place] [phone]   I, Thana Ates, am acting as a scribe for Dr. Derek Jack.  I, Derek Jack MD, have reviewed the above  documentation for accuracy and completeness, and I agree with the above.

## 2020-07-10 ENCOUNTER — Inpatient Hospital Stay (HOSPITAL_COMMUNITY): Payer: BC Managed Care – PPO

## 2020-07-10 ENCOUNTER — Inpatient Hospital Stay (HOSPITAL_COMMUNITY): Payer: BC Managed Care – PPO | Attending: Hematology | Admitting: Hematology

## 2020-07-10 ENCOUNTER — Other Ambulatory Visit: Payer: Self-pay

## 2020-07-10 VITALS — BP 115/62 | HR 54 | Temp 97.0°F | Resp 18

## 2020-07-10 VITALS — BP 107/70 | HR 58 | Temp 97.0°F | Resp 18 | Wt 195.2 lb

## 2020-07-10 DIAGNOSIS — Z79899 Other long term (current) drug therapy: Secondary | ICD-10-CM | POA: Insufficient documentation

## 2020-07-10 DIAGNOSIS — Z5189 Encounter for other specified aftercare: Secondary | ICD-10-CM | POA: Diagnosis not present

## 2020-07-10 DIAGNOSIS — Z5111 Encounter for antineoplastic chemotherapy: Secondary | ICD-10-CM | POA: Diagnosis present

## 2020-07-10 DIAGNOSIS — Z95828 Presence of other vascular implants and grafts: Secondary | ICD-10-CM

## 2020-07-10 DIAGNOSIS — C187 Malignant neoplasm of sigmoid colon: Secondary | ICD-10-CM | POA: Diagnosis not present

## 2020-07-10 DIAGNOSIS — C787 Secondary malignant neoplasm of liver and intrahepatic bile duct: Secondary | ICD-10-CM | POA: Insufficient documentation

## 2020-07-10 DIAGNOSIS — Z5112 Encounter for antineoplastic immunotherapy: Secondary | ICD-10-CM | POA: Diagnosis present

## 2020-07-10 LAB — CBC WITH DIFFERENTIAL/PLATELET
Abs Immature Granulocytes: 0.29 10*3/uL — ABNORMAL HIGH (ref 0.00–0.07)
Basophils Absolute: 0.1 10*3/uL (ref 0.0–0.1)
Basophils Relative: 0 %
Eosinophils Absolute: 0.3 10*3/uL (ref 0.0–0.5)
Eosinophils Relative: 3 %
HCT: 34.9 % — ABNORMAL LOW (ref 39.0–52.0)
Hemoglobin: 11.5 g/dL — ABNORMAL LOW (ref 13.0–17.0)
Immature Granulocytes: 3 %
Lymphocytes Relative: 11 %
Lymphs Abs: 1.3 10*3/uL (ref 0.7–4.0)
MCH: 28.8 pg (ref 26.0–34.0)
MCHC: 33 g/dL (ref 30.0–36.0)
MCV: 87.5 fL (ref 80.0–100.0)
Monocytes Absolute: 0.5 10*3/uL (ref 0.1–1.0)
Monocytes Relative: 4 %
Neutro Abs: 9.2 10*3/uL — ABNORMAL HIGH (ref 1.7–7.7)
Neutrophils Relative %: 79 %
Platelets: 124 10*3/uL — ABNORMAL LOW (ref 150–400)
RBC: 3.99 MIL/uL — ABNORMAL LOW (ref 4.22–5.81)
RDW: 15 % (ref 11.5–15.5)
WBC: 11.6 10*3/uL — ABNORMAL HIGH (ref 4.0–10.5)
nRBC: 0 % (ref 0.0–0.2)

## 2020-07-10 LAB — URINALYSIS, DIPSTICK ONLY
Bilirubin Urine: NEGATIVE
Glucose, UA: 150 mg/dL — AB
Hgb urine dipstick: NEGATIVE
Ketones, ur: NEGATIVE mg/dL
Leukocytes,Ua: NEGATIVE
Nitrite: NEGATIVE
Protein, ur: 30 mg/dL — AB
Specific Gravity, Urine: 1.019 (ref 1.005–1.030)
pH: 5 (ref 5.0–8.0)

## 2020-07-10 LAB — COMPREHENSIVE METABOLIC PANEL
ALT: 15 U/L (ref 0–44)
AST: 14 U/L — ABNORMAL LOW (ref 15–41)
Albumin: 3.9 g/dL (ref 3.5–5.0)
Alkaline Phosphatase: 135 U/L — ABNORMAL HIGH (ref 38–126)
Anion gap: 9 (ref 5–15)
BUN: 41 mg/dL — ABNORMAL HIGH (ref 8–23)
CO2: 23 mmol/L (ref 22–32)
Calcium: 9 mg/dL (ref 8.9–10.3)
Chloride: 103 mmol/L (ref 98–111)
Creatinine, Ser: 1.97 mg/dL — ABNORMAL HIGH (ref 0.61–1.24)
GFR, Estimated: 37 mL/min — ABNORMAL LOW (ref >60)
Glucose, Bld: 132 mg/dL — ABNORMAL HIGH (ref 70–99)
Potassium: 3.2 mmol/L — ABNORMAL LOW (ref 3.5–5.1)
Sodium: 135 mmol/L (ref 135–145)
Total Bilirubin: 0.8 mg/dL (ref 0.3–1.2)
Total Protein: 6.3 g/dL — ABNORMAL LOW (ref 6.5–8.1)

## 2020-07-10 MED ORDER — DEXTROSE 5 % IV SOLN: Freq: Once | INTRAVENOUS | Status: AC

## 2020-07-10 MED ORDER — SODIUM CHLORIDE 0.9 % IV SOLN: INTRAVENOUS | Status: DC

## 2020-07-10 MED ORDER — SODIUM CHLORIDE 0.9 % IV SOLN
10.0000 mg | Freq: Once | INTRAVENOUS | Status: AC
  Administered 2020-07-10: 10 mg via INTRAVENOUS
  Filled 2020-07-10: qty 10

## 2020-07-10 MED ORDER — SODIUM CHLORIDE 0.9 % IV SOLN
450.0000 mg | Freq: Once | INTRAVENOUS | Status: AC
  Administered 2020-07-10: 450 mg via INTRAVENOUS
  Filled 2020-07-10: qty 16

## 2020-07-10 MED ORDER — MAGNESIUM SULFATE 2 GM/50ML IV SOLN
2.0000 g | Freq: Once | INTRAVENOUS | Status: AC
  Administered 2020-07-10: 2 g via INTRAVENOUS
  Filled 2020-07-10: qty 50

## 2020-07-10 MED ORDER — SODIUM CHLORIDE 0.9 % IV SOLN
1920.0000 mg/m2 | INTRAVENOUS | Status: DC
  Administered 2020-07-10: 4200 mg via INTRAVENOUS
  Filled 2020-07-10: qty 84

## 2020-07-10 MED ORDER — POTASSIUM CHLORIDE IN NACL 20-0.9 MEQ/L-% IV SOLN
Freq: Once | INTRAVENOUS | Status: AC
  Filled 2020-07-10: qty 1000

## 2020-07-10 MED ORDER — LEUCOVORIN CALCIUM INJECTION 350 MG
400.0000 mg/m2 | Freq: Once | INTRAVENOUS | Status: AC
  Administered 2020-07-10: 900 mg via INTRAVENOUS
  Filled 2020-07-10: qty 45

## 2020-07-10 MED ORDER — FLUOROURACIL CHEMO INJECTION 2.5 GM/50ML
320.0000 mg/m2 | Freq: Once | INTRAVENOUS | Status: AC
  Administered 2020-07-10: 700 mg via INTRAVENOUS
  Filled 2020-07-10: qty 14

## 2020-07-10 MED ORDER — PALONOSETRON HCL INJECTION 0.25 MG/5ML
0.2500 mg | Freq: Once | INTRAVENOUS | Status: AC
  Administered 2020-07-10: 0.25 mg via INTRAVENOUS
  Filled 2020-07-10: qty 5

## 2020-07-10 MED ORDER — OXALIPLATIN CHEMO INJECTION 100 MG/20ML
67.0000 mg/m2 | Freq: Once | INTRAVENOUS | Status: AC
  Administered 2020-07-10: 145 mg via INTRAVENOUS
  Filled 2020-07-10: qty 10

## 2020-07-10 MED ORDER — ATROPINE SULFATE 1 MG/ML IJ SOLN
0.5000 mg | Freq: Once | INTRAMUSCULAR | Status: AC
  Administered 2020-07-10: 0.5 mg via INTRAVENOUS
  Filled 2020-07-10: qty 1

## 2020-07-10 MED ORDER — FOSAPREPITANT DIMEGLUMINE INJECTION 150 MG
Freq: Once | INTRAVENOUS | Status: AC
  Filled 2020-07-10: qty 150

## 2020-07-10 NOTE — Progress Notes (Signed)
Patient has been assessed, vital signs and labs have been reviewed by Dr. Delton Coombes. ANC, Creatinine, LFTs, and Platelets are within treatment parameters per Dr. Delton Coombes. The patient is good to proceed with treatment at this time.  Will receive house fluids over 2 hours today due to Cr and K+ levels. Primary RN and pharmacy aware.

## 2020-07-10 NOTE — Progress Notes (Signed)
Adjust dose of bevacizumab with new weight 88.5 kg Restarting bevacizumab and will need baseline urine protein but do not have to wait for result to give today's dose.  T.O. Dr Guadlupe Spanish, RN/Lynne Takemoto Ronnald Ramp, PharmD

## 2020-07-10 NOTE — Progress Notes (Signed)
Urine specimen sent per orders.   Treatment given per orders. Patient tolerated it well without problems. Vitals stable and discharged home from clinic ambulatory. Follow up as scheduled.

## 2020-07-10 NOTE — Patient Instructions (Addendum)
[facility] Discharge Instructions  You were seen today by Dr. Katragadda. He went over your recent results, and you received your treatment. Dr. Katragadda will see you back in 2 weeks for labs and follow up.   Thank you for choosing  [facility] to provide your oncology and hematology care.  To afford each patient quality time with our provider, please arrive at least 15 minutes before your scheduled appointment time.   If you have a lab appointment with the Cancer Center please come in thru the Main Entrance and check in at the main information desk  You need to re-schedule your appointment should you arrive 10 or more minutes late.  We strive to give you quality time with our providers, and arriving late affects you and other patients whose appointments are after yours.  Also, if you no show three or more times for appointments you may be dismissed from the clinic at the providers discretion.     Again, thank you for choosing [facility].  Our hope is that these requests will decrease the amount of time that you wait before being seen by our physicians.       _____________________________________________________________  Should you have questions after your visit to [facility], please contact our office at [phone] between the hours of 8:00 a.m. and 4:30 p.m.  Voicemails left after 4:00 p.m. will not be returned until the following business day.  For prescription refill requests, have your pharmacy contact our office and allow 72 hours.    Cancer Center Support Programs:   > Cancer Support Group  2nd Tuesday of the month 1pm-2pm, Journey Room   

## 2020-07-10 NOTE — Patient Instructions (Signed)
[facility]  Discharge Instructions: Thank you for choosing [facility] to provide your oncology and hematology care.  If you have a lab appointment with the [facility], please come in thru the Main Entrance and check in at the main information desk.  Wear comfortable clothing and clothing appropriate for easy access to any Portacath or PICC line.   We strive to give you quality time with your provider. You may need to reschedule your appointment if you arrive late (15 or more minutes).  Arriving late affects you and other patients whose appointments are after yours.  Also, if you miss three or more appointments without notifying the office, you may be dismissed from the clinic at the provider's discretion.      For prescription refill requests, have your pharmacy contact our office and allow 72 hours for refills to be completed.    Today you received the following chemotherapy and/or immunotherapy agents: oxaliplatin; leucovorin, avastin, and fluorouracil   To help prevent nausea and vomiting after your treatment, we encourage you to take your nausea medication as directed.  BELOW ARE SYMPTOMS THAT SHOULD BE REPORTED IMMEDIATELY: . *FEVER GREATER THAN 100.4 F (38 C) OR HIGHER . *CHILLS OR SWEATING . *NAUSEA AND VOMITING THAT IS NOT CONTROLLED WITH YOUR NAUSEA MEDICATION . *UNUSUAL SHORTNESS OF BREATH . *UNUSUAL BRUISING OR BLEEDING . *URINARY PROBLEMS (pain or burning when urinating, or frequent urination) . *BOWEL PROBLEMS (unusual diarrhea, constipation, pain near the anus) . TENDERNESS IN MOUTH AND THROAT WITH OR WITHOUT PRESENCE OF ULCERS (sore throat, sores in mouth, or a toothache) . UNUSUAL RASH, SWELLING OR PAIN  . UNUSUAL VAGINAL DISCHARGE OR ITCHING   Items with * indicate a potential emergency and should be followed up as soon as possible or go to the Emergency Department if any problems should occur.  Please show the CHEMOTHERAPY ALERT CARD or  IMMUNOTHERAPY ALERT CARD at check-in to the Emergency Department and triage nurse.  Should you have questions after your visit or need to cancel or reschedule your appointment, please contact [facility] [phone]  and follow the prompts.  Office hours are 8:00 a.m. to 4:30 p.m. Monday - Friday. Please note that voicemails left after 4:00 p.m. may not be returned until the following business day.  We are closed weekends and major holidays. You have access to a nurse at all times for urgent questions. Please call the main number to the clinic [phone] and follow the prompts.  For any non-urgent questions, you may also contact your provider using MyChart. We now offer e-Visits for anyone 64 and older to request care online for non-urgent symptoms. For details visit [email or web address].   Also download the MyChart app! Go to the app store, search "MyChart", open the app, select [facility], and log in with your MyChart username and password.  Due to Covid, a mask is required upon entering the hospital/clinic. If you do not have a mask, one will be given to you upon arrival. For doctor visits, patients may have 1 support person aged 64 or older with them. For treatment visits, patients cannot have anyone with them due to current Covid guidelines and our immunocompromised population.

## 2020-07-12 ENCOUNTER — Inpatient Hospital Stay (HOSPITAL_COMMUNITY): Payer: BC Managed Care – PPO

## 2020-07-12 ENCOUNTER — Other Ambulatory Visit: Payer: Self-pay

## 2020-07-12 ENCOUNTER — Encounter (HOSPITAL_COMMUNITY): Payer: BC Managed Care – PPO

## 2020-07-12 VITALS — BP 120/69 | HR 65 | Temp 97.7°F | Resp 18

## 2020-07-12 DIAGNOSIS — C187 Malignant neoplasm of sigmoid colon: Secondary | ICD-10-CM | POA: Diagnosis not present

## 2020-07-12 DIAGNOSIS — Z95828 Presence of other vascular implants and grafts: Secondary | ICD-10-CM

## 2020-07-12 MED ORDER — SODIUM CHLORIDE 0.9 % IV SOLN: INTRAVENOUS | Status: DC

## 2020-07-12 MED ORDER — HEPARIN SOD (PORK) LOCK FLUSH 100 UNIT/ML IV SOLN
500.0000 [IU] | Freq: Once | INTRAVENOUS | Status: AC | PRN
  Administered 2020-07-12: 500 [IU]

## 2020-07-12 MED ORDER — PEGFILGRASTIM-CBQV 6 MG/0.6ML ~~LOC~~ SOSY
6.0000 mg | PREFILLED_SYRINGE | Freq: Once | SUBCUTANEOUS | Status: AC
  Administered 2020-07-12: 6 mg via SUBCUTANEOUS
  Filled 2020-07-12: qty 0.6

## 2020-07-12 MED ORDER — SODIUM CHLORIDE 0.9% FLUSH
10.0000 mL | INTRAVENOUS | Status: DC | PRN
  Administered 2020-07-12: 10 mL

## 2020-07-12 NOTE — Progress Notes (Signed)
Hydration fluids and injection  given per orders. Patient tolerated it well without problems. Vitals stable and discharged home from clinic ambulatory. Follow up as scheduled.  Troy Smith presents to have home infusion pump d/c'd and for port-a-cath deaccess with flush.  Portacath located left  chest wall accessed with  H 20 needle.  Good blood return present. Portacath flushed with NS and 500U/41ml Heparin, and needle removed intact.  Procedure tolerated well and without incident.

## 2020-07-12 NOTE — Patient Instructions (Signed)
[facility]  Discharge Instructions: Thank you for choosing [facility] to provide your oncology and hematology care.  If you have a lab appointment with the Cancer Center, please come in thru the Main Entrance and check in at the main information desk.  Wear comfortable clothing and clothing appropriate for easy access to any Portacath or PICC line.   We strive to give you quality time with your provider. You may need to reschedule your appointment if you arrive late (15 or more minutes).  Arriving late affects you and other patients whose appointments are after yours.  Also, if you miss three or more appointments without notifying the office, you may be dismissed from the clinic at the provider's discretion.      For prescription refill requests, have your pharmacy contact our office and allow 72 hours for refills to be completed.        To help prevent nausea and vomiting after your treatment, we encourage you to take your nausea medication as directed.  BELOW ARE SYMPTOMS THAT SHOULD BE REPORTED IMMEDIATELY: *FEVER GREATER THAN 100.4 F (38 C) OR HIGHER *CHILLS OR SWEATING *NAUSEA AND VOMITING THAT IS NOT CONTROLLED WITH YOUR NAUSEA MEDICATION *UNUSUAL SHORTNESS OF BREATH *UNUSUAL BRUISING OR BLEEDING *URINARY PROBLEMS (pain or burning when urinating, or frequent urination) *BOWEL PROBLEMS (unusual diarrhea, constipation, pain near the anus) TENDERNESS IN MOUTH AND THROAT WITH OR WITHOUT PRESENCE OF ULCERS (sore throat, sores in mouth, or a toothache) UNUSUAL RASH, SWELLING OR PAIN  UNUSUAL VAGINAL DISCHARGE OR ITCHING   Items with * indicate a potential emergency and should be followed up as soon as possible or go to the Emergency Department if any problems should occur.  Please show the CHEMOTHERAPY ALERT CARD or IMMUNOTHERAPY ALERT CARD at check-in to the Emergency Department and triage nurse.  Should you have questions after your visit or need to cancel  or reschedule your appointment, please contact [facility] [phone]  and follow the prompts.  Office hours are 8:00 a.m. to 4:30 p.m. Monday - Friday. Please note that voicemails left after 4:00 p.m. may not be returned until the following business day.  We are closed weekends and major holidays. You have access to a nurse at all times for urgent questions. Please call the main number to the clinic [phone] and follow the prompts.  For any non-urgent questions, you may also contact your provider using MyChart. We now offer e-Visits for anyone 18 and older to request care online for non-urgent symptoms. For details visit [email or web address].   Also download the MyChart app! Go to the app store, search "MyChart", open the app, select [facility], and log in with your MyChart username and password.  Due to Covid, a mask is required upon entering the hospital/clinic. If you do not have a mask, one will be given to you upon arrival. For doctor visits, patients may have 1 support person aged 18 or older with them. For treatment visits, patients cannot have anyone with them due to current Covid guidelines and our immunocompromised population.  

## 2020-07-20 ENCOUNTER — Other Ambulatory Visit (HOSPITAL_COMMUNITY): Payer: Self-pay | Admitting: Hematology

## 2020-07-22 ENCOUNTER — Encounter (HOSPITAL_COMMUNITY): Payer: Self-pay | Admitting: Hematology

## 2020-07-23 NOTE — Progress Notes (Signed)
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 Focal narrowing and wall thickening of the sigmoid colon is noted in this area most likely due to inflammation, but neoplasm cannot be excluded, and sigmoidoscopy is recommended for further evaluation.   03/20/2016 Procedure   Colonoscopy by Dr. Laural Golden- Malignant partially obstructing tumor in the distal sigmoid colon. Biopsied. - Incomplete exam.    03/23/2016 Pathology Results   Colon, biopsy, sigmoid mass - ADENOCARCINOMA.    03/23/2016 Procedure   Low anterior resection with transanal anastomosis    03/26/2016 Pathology Results   Colon, segmental resection for tumor, sigmoid - INVASIVE WELL DIFFERENTIATED ADENOCARCINOMA, SPANNING 5 CM IN GREATEST DIMENSION. - TUMOR INVADES THROUGH MUSCULARIS PROPRIA TO INVOLVE SUBSEROSAL SOFT TISSUES. - UNDERLYING MARKED ACUTE AND CHRONIC INFLAMMATION WITH GIANT CELL REACTION AND ABSCESS FORMATION EXTENDING TO THE SEROSAL SURFACE AND INVOLVING THE MESENTERIC TISSUE. - MARGINS ARE NEGATIVE FOR TUMOR. - FOURTEEN BENIGN LYMPH NODES WHICH DEMONSTRATE VARIABLE INVOLVEMENT WITH ACUTE AND CHRONIC INFLAMMATION WITH ASSOCIATED GIANT CELLS WITHOUT TUMOR IDENTIFIED (0/14).    04/08/2016 Cancer Staging   Cancer Staging Adenocarcinoma of sigmoid colon [facility]) Staging form: Colon and Rectum, AJCC 8th Edition - Pathologic stage from 04/01/2016: Stage IIA (pT3, pN0, cM0) - Signed by  Baird Cancer, PA-C on 04/01/2016     07/09/2016 Imaging   CT abd/pelvis- No evidence of metastatic disease or other acute findings.  Bilateral nonobstructing renal calculi and mild bilateral renal parenchymal scarring.  Stable mildly enlarged prostate gland and findings of chronic bladder outlet obstruction.    08/16/2018 -  Chemotherapy    Patient is on Treatment Plan: COLORECTAL FOLFOXIRI          CANCER STAGING: Cancer Staging Adenocarcinoma of sigmoid colon [facility]) Staging form: Colon and Rectum, AJCC 8th Edition - Pathologic stage from 04/01/2016: Stage IIA (pT3, pN0, cM0) - Signed by Baird Cancer, PA-C on 04/01/2016   INTERVAL HISTORY:  Troy Smith, a 65 y.o. male, returns for routine follow-up and consideration for next cycle of chemotherapy. [name] was last seen on 07/10/2020.  Due for cycle #15 of metastatic colon cancer with local recurrence today.   Overall, he tells me he has been feeling pretty well. Today he reports stable diarrhea that he is treating with 2 lomotil TID. With imodium his diarrhea is intermittent, occurring every other day. It is watery and typically occurs following meals in the afternoon and evening; his sleep is occasionally interrupted by diarrhea. The occurrences of the episodes of diarrhea appear unrelated to diet. This is occasionally accompanied by abdominal cramping, but this is infrequent. For the first few days following treatment his appetite is absent, and he eats very limited meals. By Wednesday his appetite gradually returns. He is not currently drinking Boost or Ensure. He has not been taking magnesium recently. He denies any numbness or tingling. He denies any back pain or n/v.   Overall, he feels ready for next cycle of chemo today.   REVIEW OF SYSTEMS:  Review  of Systems  Constitutional:  Positive for appetite change (75%) and fatigue (75%).  Gastrointestinal:  Positive for diarrhea. Negative for nausea and vomiting.   Musculoskeletal:  Negative for back pain.  Neurological:  Negative for numbness.  Psychiatric/Behavioral:  Positive for sleep disturbance.   All other systems reviewed and are negative.  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[place]) 03/23/2016   Carpal tunnel syndrome 05/02/2014   Bilateral   Cervical disc disorder with radiculopathy of cervical region 05/02/2014   Left C8   Diabetes mellitus without complication ([place])    Hypertension    Port-A-Cath in place 08/10/2018   Past Surgical History:  Procedure Laterality Date   BIOPSY  03/20/2016   Procedure: BIOPSY;  Surgeon: Rogene Houston, MD;  Location[facility];  Service: Endoscopy;;  colon   BOWEL RESECTION N/A 03/23/2016   Procedure: LOW ANTERIOR RESECTION;  Surgeon: Aviva Signs, MD;  Location: [facility] ORS;  Service: General;  Laterality: N/A;   COLON SURGERY  01/2019   Done at [place] 03/20/2016   Procedure: COLONOSCOPY;  Surgeon: Rogene Houston, MD;  Location: [facility] ENDO [facility];  Service: Endoscopy;  Laterality: N/A;  730   COLONOSCOPY N/A 07/10/2016   Procedure: COLONOSCOPY;  Surgeon: Rogene Houston, MD;  Location: [facility];  Service: Endoscopy;  Laterality: N/A;  1040   NO PAST SURGERIES     POLYPECTOMY  07/10/2016   Procedure: POLYPECTOMY;  Surgeon: Rogene Houston, MD;  Location: [facility];  Service: Endoscopy;;  colon   PORTACATH PLACEMENT Left 08/05/2018   Procedure: INSERTION PORT-A-CATH;  Surgeon: Aviva Signs, MD;  Location: [facility] ORS;  Service: General;  Laterality: Left;   TONSILLECTOMY      SOCIAL HISTORY:  Social History   Socioeconomic History   Marital status: Married    Spouse name: Not on file   Number of children: Not on file   Years of education: Not on file   Highest education level: Not on file  Occupational History   Not on file  Tobacco Use   Smoking status: Never   Smokeless tobacco: Never  Vaping Use    Vaping Use: Never used  Substance and Sexual Activity   Alcohol use: Yes    Comment: rarely   Drug use: No   Sexual activity: Not on file  Other Topics Concern   Not on file  Social History Narrative   Not on file   Social Determinants of Health   Financial Resource Strain: Not on file  Food Insecurity: Not on file  Transportation Needs: No Transportation Needs   Lack of Transportation (Medical): No   Lack of Transportation (Non-Medical): No  Physical Activity: Inactive   Days of Exercise per Week: 0 days   Minutes of Exercise per Session: 0 min  Stress: Not on file  Social Connections: Not on file  Intimate Partner Violence: Not At Risk   Fear of Current or Ex-Partner: No   Emotionally Abused: No   Physically Abused: No   Sexually Abused: No    FAMILY HISTORY:  Family History  Problem Relation Age of Onset   COPD Mother    Melanoma Father     CURRENT MEDICATIONS:  Current Outpatient Medications  Medication Sig Dispense Refill   allopurinol (ZYLOPRIM) 300 MG tablet Take 1 tablet (300 mg total) by mouth daily. 30 tablet 6   amLODipine (NORVASC) 10 MG tablet Take  10 mg by mouth daily.     diphenoxylate-atropine (LOMOTIL) 2.5-0.025 MG tablet TAKE 2 AFTER FIRST ONSET OF DIARRHEA OR LOOSE STOOL, THEN 1 AFTER EACH LOOSE STOOL. 60 tablet 0   donepezil (ARICEPT) 10 MG tablet TAKE (1) TABLET BY MOUTH AT BEDTIME. 30 tablet 6   furosemide (LASIX) 20 MG tablet TAKE (1) TABLET BY MOUTH ONCE DAILY. 30 tablet 6   insulin glargine (LANTUS) 100 UNIT/ML injection Inject 30 Units into the skin at bedtime.      loperamide (IMODIUM A-D) 2 MG tablet Take 2 at onset of diarrhea, then 1 after each watery bowel movement. 100 tablet 1   magnesium oxide (MAG-OX) 400 (241.3 Mg) MG tablet Take 1 tablet (400 mg total) by mouth 2 (two) times daily. 60 tablet 2   memantine (NAMENDA) 10 MG tablet Take 1 tablet (10 mg total) by mouth 2 (two) times daily. 60 tablet 5   metoCLOPramide (REGLAN) 10 MG  tablet Take 1 tablet (10 mg total) by mouth every 6 (six) hours as needed for nausea. 120 tablet 3   metoprolol succinate (TOPROL-XL) 50 MG 24 hr tablet Take 50 mg by mouth daily. Take with or immediately following a meal.     prochlorperazine (COMPAZINE) 10 MG tablet Take 1 tablet (10 mg total) by mouth every 6 (six) hours as needed (Nausea or vomiting). 60 tablet 1   scopolamine (TRANSDERM-SCOP) 1 MG/3DAYS Place 1 patch (1.5 mg total) onto the skin every 3 (three) days. 10 patch 12   No current facility-administered medications for this visit.    ALLERGIES:  Allergies  Allergen Reactions   Penicillin G Nausea And Vomiting    PHYSICAL EXAM:  Performance status (ECOG): 1 - Symptomatic but completely ambulatory  There were no vitals filed for this visit. Wt Readings from Last 3 Encounters:  07/10/20 195 lb 3.2 oz (88.5 kg)  06/26/20 209 lb 6.4 oz (95 kg)  06/17/20 213 lb 8 oz (96.8 kg)   Physical Exam Vitals reviewed.  Constitutional:      Appearance: Normal appearance.  Cardiovascular:     Rate and Rhythm: Normal rate and regular rhythm.     Pulses: Normal pulses.     Heart sounds: Normal heart sounds.  Pulmonary:     Effort: Pulmonary effort is normal.     Breath sounds: Normal breath sounds.  Abdominal:     Palpations: Abdomen is soft. There is no hepatomegaly, splenomegaly or mass.     Tenderness: There is no abdominal tenderness.  Musculoskeletal:     Right lower leg: No edema.     Left lower leg: No edema.  Neurological:     General: No focal deficit present.     Mental Status: He is alert and oriented to person, place, and time.  Psychiatric:        Mood and Affect: Mood normal.        Behavior: Behavior normal.    LABORATORY DATA:  I have reviewed the labs as listed.  CBC Latest Ref Rng & Units 07/10/2020 06/26/2020 06/17/2020  WBC 4.0 - 10.5 K/uL 11.6(H) 4.4 6.4  Hemoglobin 13.0 - 17.0 g/dL 11.5(L) 10.8(L) 11.4(L)  Hematocrit 39.0 - 52.0 % 34.9(L) 33.1(L)  35.6(L)  Platelets 150 - 400 K/uL 124(L) 141(L) 159   CMP Latest Ref Rng & Units 07/10/2020 06/26/2020 06/17/2020  Glucose 70 - 99 mg/dL 132(H) 136(H) 125(H)  BUN 8 - 23 mg/dL 41(H) 29(H) 22  Creatinine 0.61 - 1.24 mg/dL 1.97(H) 1.47(H) 1.47(H)  Sodium 135 - 145 mmol/L 135 138 139  Potassium 3.5 - 5.1 mmol/L 3.2(L) 4.0 4.5  Chloride 98 - 111 mmol/L 103 105 107  CO2 22 - 32 mmol/L 23 25 26   Calcium 8.9 - 10.3 mg/dL 9.0 8.9 9.6  Total Protein 6.5 - 8.1 g/dL 6.3(L) 6.6 7.0  Total Bilirubin 0.3 - 1.2 mg/dL 0.8 0.9 1.0  Alkaline Phos 38 - 126 U/L 135(H) 124 132(H)  AST 15 - 41 U/L 14(L) 14(L) 14(L)  ALT 0 - 44 U/L 15 13 12     DIAGNOSTIC IMAGING:  I have independently reviewed the scans and discussed with the patient. NM PET Image Restag (PS) Skull Base To Thigh  Result Date: 07/03/2020 CLINICAL DATA:  Subsequent treatment strategy for colon cancer. EXAM: NUCLEAR MEDICINE PET SKULL BASE TO THIGH TECHNIQUE: 12.2 mCi F-18 FDG was injected intravenously. Full-ring PET imaging was performed from the skull base to thigh after the radiotracer. CT data was obtained and used for attenuation correction and anatomic localization. Fasting blood glucose: 142 mg/dl COMPARISON:  04/08/2020 FINDINGS: Mediastinal blood pool activity: SUV max 2.7 Liver activity: SUV max NA NECK: No hypermetabolic lymph nodes in the neck. Incidental CT findings: none CHEST: No hypermetabolic mediastinal or hilar nodes. No suspicious pulmonary nodules on the CT scan. Incidental CT findings: Coronary artery calcification is evident. Atherosclerotic calcification is noted in the wall of the thoracic aorta. Left Port-A-Cath tip is in the mid SVC. ABDOMEN/PELVIS: No abnormal hypermetabolic activity within the liver, pancreas, adrenal glands, or spleen. No hypermetabolic lymph nodes in the abdomen or pelvis. The hypermetabolic presacral seen previously has increased in size in the interval measuring 3.6 x 3.1 cm today compared to 2.4 x 2.3 cm  previously. Lesion shows SUV max = 8 today compared to 5.4 previously. Destructive changes in the sacrum persist and remain markedly hypermetabolic with SUV max = 58.8 today compared to 6.4 previously. Incidental CT findings: Bladder is markedly distended with multiple stones in the dependent lumen. Mass-effect on the bladder base secondary to median lobe hypertrophy is similar in appearance. SKELETON: There is diffuse FDG accumulation in the bony anatomy, presumably secondary to marrow stimulatory effects. Incidental CT findings: none IMPRESSION: 1. Previously identified hypermetabolic soft tissue lesion in the presacral space shows interval progression, concerning for progression of recurrent disease. 2. Destructive changes in the sacrum also appear mildly progressive with increasing hypermetabolism. 3. Interval development of diffuse marrow uptake on today's study, presumably secondary to stimulatory effects of therapy. Electronically Signed   By: Misty Stanley M.D.   On: 07/03/2020 11:25     ASSESSMENT:  1.  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 -Laparoscopic partial hepatectomy, redo low anterior resection on 02/01/2019, pathology showing YPT3Y PN 0.  Positive circumferential/radial/mesenteric margin. -6 more cycles of FOLFOX completed on 05/31/2019. -Xeloda with radiation from 06/26/2019 through 08/03/2019. -CT CAP on 09/14/2019 shows postoperative changes in the rectosigmoid resection and reanastomosis with interval increase in posttreatment perirectal and presacral soft tissue thickening consistent with radiation changes.  No adenopathy.  Multiple stable small bilateral pulmonary nodules, likely benign.  No new evidence of metastatic disease.  Unchanged splenomegaly. - PET scan on 04/08/2020 done secondary to increase in CEA  shows abnormal hypermetabolic soft tissue in the presacral region suspicious for recurrence.  Sclerosis involving sacrum.  Small left axillary lymph nodes favored to  be reactive. - MRI pelvis on 04/26/2020 with enhancing lesion involving S1 segment of the sacrum concerning for metastatic disease.  Peripherally enhancing soft tissue nodule within the presacral soft tissues measuring 2.4 x 2.1 cm. - PET scan on 07/01/2020 showed hypermetabolic soft tissue lesion in the presacral space shows interval progression.  Destructive changes in the sacrum also mildly progressive.  Interval development of diffuse marrow uptake, likely from growth factor. - I talked to Dr. Oneita Kras from radiation oncology at [facility].  She has recommended 2 to 3 months of chemotherapy followed by repeat pelvic MRI.   PLAN:  1.  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  No bleeding issues reported.  No back pain. - Occasional tingling in the right fingers 3 4 and 5 stable. - He had some problems with diarrhea on and off. - Reviewed labs today which showed elevated alkaline phosphatase 145.  Other LFTs are normal.  CBC shows normal white count and slightly decreased platelet count. - Last UA did not show any significant protein. - Proceed with cycle 3 today.  We will return to clinic in 2 weeks for follow-up.   2.  Diarrhea: -.  He has occasional diarrhea after eating.  He is taking Lomotil 2 tablets 3 times a day. - He does not report diarrhea every day.  I have told him to increase Lomotil to 2 tablets 4 times daily.   3.  Hypomagnesemia: - Continue magnesium twice daily.   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   5.  Gout: - Continue allopurinol 300 mg daily.   6.  CKD: - Creatinine has improved to 1.29.  Continue aggressive hydration at home.   7.  Lower extremity swelling: - He is not requiring Lasix on a daily basis.   Orders placed this encounter:  No  orders of the defined types were placed in this encounter.    Derek Jack, MD [place] [phone]   I, Thana Ates, am acting as a scribe for Dr. Derek Jack.  I, Derek Jack MD, have reviewed the above documentation for accuracy and completeness, and I agree with the above.

## 2020-07-24 ENCOUNTER — Inpatient Hospital Stay (HOSPITAL_COMMUNITY): Payer: BC Managed Care – PPO

## 2020-07-24 ENCOUNTER — Other Ambulatory Visit: Payer: Self-pay

## 2020-07-24 ENCOUNTER — Inpatient Hospital Stay (HOSPITAL_COMMUNITY): Payer: BC Managed Care – PPO | Admitting: Hematology

## 2020-07-24 VITALS — BP 116/68 | HR 56 | Temp 96.3°F | Resp 18

## 2020-07-24 VITALS — BP 123/81 | HR 70 | Temp 97.7°F | Resp 18 | Wt 191.4 lb

## 2020-07-24 DIAGNOSIS — Z95828 Presence of other vascular implants and grafts: Secondary | ICD-10-CM

## 2020-07-24 DIAGNOSIS — C187 Malignant neoplasm of sigmoid colon: Secondary | ICD-10-CM | POA: Diagnosis not present

## 2020-07-24 LAB — CBC WITH DIFFERENTIAL/PLATELET
Abs Immature Granulocytes: 0.25 10*3/uL — ABNORMAL HIGH (ref 0.00–0.07)
Basophils Absolute: 0 10*3/uL (ref 0.0–0.1)
Basophils Relative: 1 %
Eosinophils Absolute: 0.2 10*3/uL (ref 0.0–0.5)
Eosinophils Relative: 2 %
HCT: 30.2 % — ABNORMAL LOW (ref 39.0–52.0)
Hemoglobin: 10 g/dL — ABNORMAL LOW (ref 13.0–17.0)
Immature Granulocytes: 4 %
Lymphocytes Relative: 12 %
Lymphs Abs: 0.8 10*3/uL (ref 0.7–4.0)
MCH: 29.3 pg (ref 26.0–34.0)
MCHC: 33.1 g/dL (ref 30.0–36.0)
MCV: 88.6 fL (ref 80.0–100.0)
Monocytes Absolute: 0.4 10*3/uL (ref 0.1–1.0)
Monocytes Relative: 6 %
Neutro Abs: 4.9 10*3/uL (ref 1.7–7.7)
Neutrophils Relative %: 75 %
Platelets: 123 10*3/uL — ABNORMAL LOW (ref 150–400)
RBC: 3.41 MIL/uL — ABNORMAL LOW (ref 4.22–5.81)
RDW: 16.6 % — ABNORMAL HIGH (ref 11.5–15.5)
WBC: 6.6 10*3/uL (ref 4.0–10.5)
nRBC: 0 % (ref 0.0–0.2)

## 2020-07-24 LAB — COMPREHENSIVE METABOLIC PANEL
ALT: 12 U/L (ref 0–44)
AST: 14 U/L — ABNORMAL LOW (ref 15–41)
Albumin: 3.3 g/dL — ABNORMAL LOW (ref 3.5–5.0)
Alkaline Phosphatase: 145 U/L — ABNORMAL HIGH (ref 38–126)
Anion gap: 7 (ref 5–15)
BUN: 12 mg/dL (ref 8–23)
CO2: 22 mmol/L (ref 22–32)
Calcium: 8.7 mg/dL — ABNORMAL LOW (ref 8.9–10.3)
Chloride: 108 mmol/L (ref 98–111)
Creatinine, Ser: 1.29 mg/dL — ABNORMAL HIGH (ref 0.61–1.24)
GFR, Estimated: >60 mL/min (ref >60)
Glucose, Bld: 148 mg/dL — ABNORMAL HIGH (ref 70–99)
Potassium: 3.3 mmol/L — ABNORMAL LOW (ref 3.5–5.1)
Sodium: 137 mmol/L (ref 135–145)
Total Bilirubin: 0.6 mg/dL (ref 0.3–1.2)
Total Protein: 5.8 g/dL — ABNORMAL LOW (ref 6.5–8.1)

## 2020-07-24 LAB — MAGNESIUM: Magnesium: 1.8 mg/dL (ref 1.7–2.4)

## 2020-07-24 MED ORDER — BEVACIZUMAB-AWWB CHEMO INJECTION 400 MG/16ML
5.0000 mg/kg | Freq: Once | INTRAVENOUS | Status: AC
Start: 2020-07-24 — End: 2020-07-24
  Administered 2020-07-24: 400 mg via INTRAVENOUS
  Filled 2020-07-24: qty 16

## 2020-07-24 MED ORDER — SODIUM CHLORIDE 0.9 % IV SOLN: Freq: Once | INTRAVENOUS | Status: AC

## 2020-07-24 MED ORDER — DEXTROSE 5 % IV SOLN: Freq: Once | INTRAVENOUS | Status: AC

## 2020-07-24 MED ORDER — ATROPINE SULFATE 1 MG/ML IJ SOLN
0.5000 mg | Freq: Once | INTRAMUSCULAR | Status: AC
  Administered 2020-07-24: 0.5 mg via INTRAVENOUS
  Filled 2020-07-24: qty 1

## 2020-07-24 MED ORDER — SODIUM CHLORIDE 0.9 % IV SOLN
1920.0000 mg/m2 | INTRAVENOUS | Status: DC
  Administered 2020-07-24: 4200 mg via INTRAVENOUS
  Filled 2020-07-24: qty 84

## 2020-07-24 MED ORDER — FLUOROURACIL CHEMO INJECTION 2.5 GM/50ML
320.0000 mg/m2 | Freq: Once | INTRAVENOUS | Status: AC
  Administered 2020-07-24: 700 mg via INTRAVENOUS
  Filled 2020-07-24: qty 14

## 2020-07-24 MED ORDER — LEUCOVORIN CALCIUM INJECTION 350 MG
400.0000 mg/m2 | Freq: Once | INTRAMUSCULAR | Status: AC
  Administered 2020-07-24: 900 mg via INTRAVENOUS
  Filled 2020-07-24: qty 45

## 2020-07-24 MED ORDER — SODIUM CHLORIDE 0.9 % IV SOLN
10.0000 mg | Freq: Once | INTRAVENOUS | Status: AC
  Administered 2020-07-24: 10 mg via INTRAVENOUS
  Filled 2020-07-24: qty 10

## 2020-07-24 MED ORDER — OXALIPLATIN CHEMO INJECTION 100 MG/20ML
67.0000 mg/m2 | Freq: Once | INTRAVENOUS | Status: AC
  Administered 2020-07-24: 145 mg via INTRAVENOUS
  Filled 2020-07-24: qty 20

## 2020-07-24 MED ORDER — PALONOSETRON HCL INJECTION 0.25 MG/5ML
0.2500 mg | Freq: Once | INTRAVENOUS | Status: AC
  Administered 2020-07-24: 0.25 mg via INTRAVENOUS
  Filled 2020-07-24: qty 5

## 2020-07-24 MED ORDER — SODIUM CHLORIDE 0.9 % IV SOLN: 5.0000 mg/kg | Freq: Once | INTRAVENOUS | Status: DC

## 2020-07-24 MED ORDER — SODIUM CHLORIDE 0.9 % IV SOLN
Freq: Once | INTRAVENOUS | Status: AC
Start: 2020-07-24 — End: 2020-07-24
  Filled 2020-07-24: qty 150

## 2020-07-24 MED ORDER — SODIUM CHLORIDE 0.9% FLUSH
10.0000 mL | INTRAVENOUS | Status: DC | PRN
  Administered 2020-07-24: 10 mL via INTRAVENOUS

## 2020-07-24 NOTE — Patient Instructions (Signed)
[facility]  Discharge Instructions: Thank you for choosing [facility] to provide your oncology and hematology care.  If you have a lab appointment with the [facility], please come in thru the Main Entrance and check in at the main information desk.  Wear comfortable clothing and clothing appropriate for easy access to any Portacath or PICC line.   We strive to give you quality time with your provider. You may need to reschedule your appointment if you arrive late (15 or more minutes).  Arriving late affects you and other patients whose appointments are after yours.  Also, if you miss 64 or more appointments without notifying the office, you may be dismissed from the clinic at the provider's discretion.      For prescription refill requests, have your pharmacy contact our office and allow 72 hours for refills to be completed.    Today you received the following chemotherapy and/or immunotherapy agents Folfox, Avastin.       To help prevent nausea and vomiting after your treatment, we encourage you to take your nausea medication as directed.  BELOW ARE SYMPTOMS THAT SHOULD BE REPORTED IMMEDIATELY: *FEVER GREATER THAN 100.4 F (38 C) OR HIGHER *CHILLS OR SWEATING *NAUSEA AND VOMITING THAT IS NOT CONTROLLED WITH YOUR NAUSEA MEDICATION *UNUSUAL SHORTNESS OF BREATH *UNUSUAL BRUISING OR BLEEDING *URINARY PROBLEMS (pain or burning when urinating, or frequent urination) *BOWEL PROBLEMS (unusual diarrhea, constipation, pain near the anus) TENDERNESS IN MOUTH AND THROAT WITH OR WITHOUT PRESENCE OF ULCERS (sore throat, sores in mouth, or a toothache) UNUSUAL RASH, SWELLING OR PAIN  UNUSUAL VAGINAL DISCHARGE OR ITCHING   Items with * indicate a potential emergency and should be followed up as soon as possible or go to the Emergency Department if any problems should occur.  Please show the CHEMOTHERAPY ALERT CARD or IMMUNOTHERAPY ALERT CARD at check-in to the  Emergency Department and triage nurse.  Should you have questions after your visit or need to cancel or reschedule your appointment, please contact [facility] [phone]  and follow the prompts.  Office hours are 8:00 a.m. to 4:30 p.m. Monday - Friday. Please note that voicemails left after 4:00 p.m. may not be returned until the following business day.  We are closed weekends and major holidays. You have access to a nurse at all times for urgent questions. Please call the main number to the clinic [phone] and follow the prompts.  For any non-urgent questions, you may also contact your provider using MyChart. We now offer e-Visits for anyone 64 and older to request care online for non-urgent symptoms. For details visit [email or web address].   Also download the MyChart app! Go to the app store, search "MyChart", open the app, select [facility], and log in with your MyChart username and password.  Due to Covid, a mask is required upon entering the hospital/clinic. If you do not have a mask, one will be given to you upon arrival. For doctor visits, patients may have 1 support person aged 64 or older with them. For treatment visits, patients cannot have anyone with them due to current Covid guidelines and our immunocompromised population.

## 2020-07-24 NOTE — Progress Notes (Signed)
Patient has been assessed, vital signs and labs have been reviewed by Dr. Katragadda. ANC, Creatinine, LFTs, and Platelets are within treatment parameters per Dr. Katragadda. The patient is good to proceed with treatment at this time. Primary RN and pharmacy aware.  

## 2020-07-24 NOTE — Progress Notes (Signed)
Patients port flushed without difficulty.  Good blood return noted with no bruising or swelling noted at site. Transparent dressing applied.  Patient remains accessed for treatment today.  Patient stable with no complaints voiced.

## 2020-07-24 NOTE — Progress Notes (Signed)
Patient presents today for treatment and follow up visit with Dr. Delton Coombes. Labs pending. Vital signs within parameters for treatment. MAR reviewed. Patient has no complaints of any changes since his last treatment. Patient's wife at the bedside states, " he still has diarrhea and the Lomotil really doesn't help any." No increase in severity. Patient teaching performed pertaining to Imodium. Patient instructed to inform Dr. Delton Coombes the Lomotil does not resolve his diarrhea. Understanding verbalized.   Labs within parameters for treatment. Potassium 3.3 today. Magnesium 1.8.   Message received from [facility] / Dr. Delton Coombes to proceed with treatment. Labs reviewed.    Treatment given today per MD orders. Tolerated infusion without adverse affects. Vital signs stable. No complaints at this time. RUN noted on 5FU pump screen and verified with patient. Discharged from clinic ambulatory in stable condition. Alert and oriented x 3. F/U with [facility] as scheduled.

## 2020-07-24 NOTE — Patient Instructions (Addendum)
[facility] at [facility] Discharge Instructions  You were seen today by Dr. Katragadda. He went over your recent results, and you received your treatment. Dr. Katragadda will see you back in 2 weeks for labs and follow up.   Thank you for choosing [facility] at [facility] to provide your oncology and hematology care.  To afford each patient quality time with our provider, please arrive at least 15 minutes before your scheduled appointment time.   If you have a lab appointment with the Cancer Center please come in thru the Main Entrance and check in at the main information desk  You need to re-schedule your appointment should you arrive 10 or more minutes late.  We strive to give you quality time with our providers, and arriving late affects you and other patients whose appointments are after yours.  Also, if you no show three or more times for appointments you may be dismissed from the clinic at the providers discretion.     Again, thank you for choosing [facility].  Our hope is that these requests will decrease the amount of time that you wait before being seen by our physicians.       _____________________________________________________________  Should you have questions after your visit to [facility], please contact our office at [phone] between the hours of 8:00 a.m. and 4:30 p.m.  Voicemails left after 4:00 p.m. will not be returned until the following business day.  For prescription refill requests, have your pharmacy contact our office and allow 72 hours.    Cancer Center Support Programs:   > Cancer Support Group  2nd Tuesday of the month 1pm-2pm, [facility]   

## 2020-07-25 LAB — CEA: CEA: 47.6 ng/mL — ABNORMAL HIGH (ref 0.0–4.7)

## 2020-07-26 ENCOUNTER — Inpatient Hospital Stay (HOSPITAL_COMMUNITY): Payer: BC Managed Care – PPO

## 2020-07-26 ENCOUNTER — Encounter (HOSPITAL_COMMUNITY): Payer: Self-pay

## 2020-07-26 ENCOUNTER — Other Ambulatory Visit: Payer: Self-pay

## 2020-07-26 VITALS — BP 128/73 | HR 63 | Temp 97.9°F | Resp 18

## 2020-07-26 DIAGNOSIS — C187 Malignant neoplasm of sigmoid colon: Secondary | ICD-10-CM

## 2020-07-26 DIAGNOSIS — Z95828 Presence of other vascular implants and grafts: Secondary | ICD-10-CM

## 2020-07-26 MED ORDER — PEGFILGRASTIM-CBQV 6 MG/0.6ML ~~LOC~~ SOSY
6.0000 mg | PREFILLED_SYRINGE | Freq: Once | SUBCUTANEOUS | Status: AC
  Administered 2020-07-26: 6 mg via SUBCUTANEOUS
  Filled 2020-07-26: qty 0.6

## 2020-07-26 MED ORDER — HEPARIN SOD (PORK) LOCK FLUSH 100 UNIT/ML IV SOLN: 500.0000 [IU] | Freq: Once | INTRAVENOUS | Status: AC | PRN

## 2020-07-26 MED ORDER — SODIUM CHLORIDE 0.9% FLUSH: 10.0000 mL | INTRAVENOUS | Status: AC | PRN

## 2020-07-26 NOTE — Progress Notes (Incomplete)
Patient tolerated injection with no complaints voiced. Site clean and dry with no bruising or swelling noted at site. See MAR for details. Band aid applied.  Patient stable during and after injection.   Patient came in today for pump d/c upon assessment of dressing, RN noticed white powder substance around dressing and around left side of chest/ abdomen. Patient reports no burning or itching of chest/abdomen. RN noted needle had become deaccessed at some point since discharge on 07/24/20. Patient reports that he is very active outside while continuous chemo pump is infusing. Patient's disc was around needle and dressing was loose but still covering port site. RN washed chest/abdomen with warm soapy water. Rinsed with clean water and rag. Skin has mild areas of redness at left chest and lower abdomen.  Reeducated wife and patient about importance on monitoring port site during infusion and notifying clinic during next infusion if he notices any changes with his skin or port site. Rn gave patient and wife after hours number if they have any questions or concerns over the weekend.     VSS with discharge and left in satisfactory condition with no s/s of distress noted.

## 2020-07-26 NOTE — Patient Instructions (Signed)
[place]  Discharge Instructions: Thank you for choosing [facility] to provide your oncology and hematology care.  If you have a lab appointment with the [facility], please come in thru the Main Entrance and check in at the main information desk.  Wear comfortable clothing and clothing appropriate for easy access to any Portacath or PICC line.   We strive to give you quality time with your provider. You may need to reschedule your appointment if you arrive late (15 or more minutes).  Arriving late affects you and other patients whose appointments are after yours.  Also, if you miss three or more appointments without notifying the office, you may be dismissed from the clinic at the provider's discretion.      For prescription refill requests, have your pharmacy contact our office and allow 72 hours for refills to be completed.    Today you received you Udenyca injection and your pump was deaccessed. You received the number to our after hours service line if you notice any changes in your skin over the weekend.   To help prevent nausea and vomiting after your treatment, we encourage you to take your nausea medication as directed.  BELOW ARE SYMPTOMS THAT SHOULD BE REPORTED IMMEDIATELY: *FEVER GREATER THAN 100.4 F (38 C) OR HIGHER *CHILLS OR SWEATING *NAUSEA AND VOMITING THAT IS NOT CONTROLLED WITH YOUR NAUSEA MEDICATION *UNUSUAL SHORTNESS OF BREATH *UNUSUAL BRUISING OR BLEEDING *URINARY PROBLEMS (pain or burning when urinating, or frequent urination) *BOWEL PROBLEMS (unusual diarrhea, constipation, pain near the anus) TENDERNESS IN MOUTH AND THROAT WITH OR WITHOUT PRESENCE OF ULCERS (sore throat, sores in mouth, or a toothache) UNUSUAL RASH, SWELLING OR PAIN  UNUSUAL VAGINAL DISCHARGE OR ITCHING   Items with * indicate a potential emergency and should be followed up as soon as possible or go to the Emergency Department if any problems should occur.  Please  show the CHEMOTHERAPY ALERT CARD or IMMUNOTHERAPY ALERT CARD at check-in to the Emergency Department and triage nurse.  Should you have questions after your visit or need to cancel or reschedule your appointment, please contact [facility] [phone]  and follow the prompts.  Office hours are 8:00 a.m. to 4:30 p.m. Monday - Friday. Please note that voicemails left after 4:00 p.m. may not be returned until the following business day.  We are closed weekends and major holidays. You have access to a nurse at all times for urgent questions. Please call the main number to the clinic [phone] and follow the prompts.  For any non-urgent questions, you may also contact your provider using MyChart. We now offer e-Visits for anyone 75 and older to request care online for non-urgent symptoms. For details visit [email or web address].   Also download the MyChart app! Go to the app store, search "MyChart", open the app, select [facility], and log in with your MyChart username and password.  Due to Covid, a mask is required upon entering the hospital/clinic. If you do not have a mask, one will be given to you upon arrival. For doctor visits, patients may have 1 support person aged 5 or older with them. For treatment visits, patients cannot have anyone with them due to current Covid guidelines and our immunocompromised population.

## 2020-07-31 ENCOUNTER — Other Ambulatory Visit (HOSPITAL_COMMUNITY): Payer: Self-pay | Admitting: Hematology and Oncology

## 2020-07-31 ENCOUNTER — Encounter (HOSPITAL_COMMUNITY): Payer: Self-pay

## 2020-07-31 MED ORDER — DIPHENOXYLATE-ATROPINE 2.5-0.025 MG PO TABS: ORAL_TABLET | ORAL | 0 refills | Status: AC

## 2020-07-31 NOTE — Progress Notes (Signed)
Patient's wife called reporting unresolved diarrhea with Lomotil. Dr. Alvy Bimler made aware and recommended 4mg  Imodium 4 times daily with Lomotil in between as needed, refill for lomotil sent per wife's request. Wife was also instructed to ensure that the patient stops Reglan per Dr. Alvy Bimler. Wife verbalized understanding.

## 2020-08-04 ENCOUNTER — Encounter (HOSPITAL_COMMUNITY): Payer: Self-pay

## 2020-08-07 ENCOUNTER — Encounter (HOSPITAL_COMMUNITY): Payer: Self-pay | Admitting: Hematology and Oncology

## 2020-08-07 ENCOUNTER — Inpatient Hospital Stay (HOSPITAL_COMMUNITY): Payer: BC Managed Care – PPO

## 2020-08-07 ENCOUNTER — Inpatient Hospital Stay (HOSPITAL_BASED_OUTPATIENT_CLINIC_OR_DEPARTMENT_OTHER): Payer: BC Managed Care – PPO | Admitting: Hematology and Oncology

## 2020-08-07 VITALS — BP 129/81 | HR 56 | Temp 96.7°F | Resp 18

## 2020-08-07 DIAGNOSIS — R634 Abnormal weight loss: Secondary | ICD-10-CM

## 2020-08-07 DIAGNOSIS — C187 Malignant neoplasm of sigmoid colon: Secondary | ICD-10-CM

## 2020-08-07 DIAGNOSIS — N179 Acute kidney failure, unspecified: Secondary | ICD-10-CM | POA: Insufficient documentation

## 2020-08-07 DIAGNOSIS — Z95828 Presence of other vascular implants and grafts: Secondary | ICD-10-CM

## 2020-08-07 DIAGNOSIS — R64 Cachexia: Secondary | ICD-10-CM | POA: Diagnosis not present

## 2020-08-07 DIAGNOSIS — D61818 Other pancytopenia: Secondary | ICD-10-CM | POA: Diagnosis not present

## 2020-08-07 DIAGNOSIS — K521 Toxic gastroenteritis and colitis: Secondary | ICD-10-CM | POA: Insufficient documentation

## 2020-08-07 LAB — CBC WITH DIFFERENTIAL/PLATELET
Abs Immature Granulocytes: 0.06 10*3/uL (ref 0.00–0.07)
Basophils Absolute: 0 10*3/uL (ref 0.0–0.1)
Basophils Relative: 0 %
Eosinophils Absolute: 0.1 10*3/uL (ref 0.0–0.5)
Eosinophils Relative: 1 %
HCT: 29.6 % — ABNORMAL LOW (ref 39.0–52.0)
Hemoglobin: 10 g/dL — ABNORMAL LOW (ref 13.0–17.0)
Immature Granulocytes: 1 %
Lymphocytes Relative: 14 %
Lymphs Abs: 0.8 10*3/uL (ref 0.7–4.0)
MCH: 30.4 pg (ref 26.0–34.0)
MCHC: 33.8 g/dL (ref 30.0–36.0)
MCV: 90 fL (ref 80.0–100.0)
Monocytes Absolute: 0.4 10*3/uL (ref 0.1–1.0)
Monocytes Relative: 7 %
Neutro Abs: 4.5 10*3/uL (ref 1.7–7.7)
Neutrophils Relative %: 77 %
Platelets: 133 10*3/uL — ABNORMAL LOW (ref 150–400)
RBC: 3.29 MIL/uL — ABNORMAL LOW (ref 4.22–5.81)
RDW: 17.8 % — ABNORMAL HIGH (ref 11.5–15.5)
WBC: 5.8 10*3/uL (ref 4.0–10.5)
nRBC: 0 % (ref 0.0–0.2)

## 2020-08-07 LAB — COMPREHENSIVE METABOLIC PANEL
ALT: 12 U/L (ref 0–44)
AST: 14 U/L — ABNORMAL LOW (ref 15–41)
Albumin: 3.7 g/dL (ref 3.5–5.0)
Alkaline Phosphatase: 144 U/L — ABNORMAL HIGH (ref 38–126)
Anion gap: 9 (ref 5–15)
BUN: 12 mg/dL (ref 8–23)
CO2: 23 mmol/L (ref 22–32)
Calcium: 8.7 mg/dL — ABNORMAL LOW (ref 8.9–10.3)
Chloride: 106 mmol/L (ref 98–111)
Creatinine, Ser: 1.48 mg/dL — ABNORMAL HIGH (ref 0.61–1.24)
GFR, Estimated: 53 mL/min — ABNORMAL LOW (ref >60)
Glucose, Bld: 112 mg/dL — ABNORMAL HIGH (ref 70–99)
Potassium: 3 mmol/L — ABNORMAL LOW (ref 3.5–5.1)
Sodium: 138 mmol/L (ref 135–145)
Total Bilirubin: 0.8 mg/dL (ref 0.3–1.2)
Total Protein: 6.2 g/dL — ABNORMAL LOW (ref 6.5–8.1)

## 2020-08-07 LAB — MAGNESIUM: Magnesium: 1.7 mg/dL (ref 1.7–2.4)

## 2020-08-07 MED ORDER — PALONOSETRON HCL INJECTION 0.25 MG/5ML
0.2500 mg | Freq: Once | INTRAVENOUS | Status: AC
  Administered 2020-08-07: 0.25 mg via INTRAVENOUS

## 2020-08-07 MED ORDER — ATROPINE SULFATE 1 MG/ML IJ SOLN
0.5000 mg | Freq: Once | INTRAMUSCULAR | Status: AC
  Administered 2020-08-07: 0.5 mg via INTRAVENOUS

## 2020-08-07 MED ORDER — LEUCOVORIN CALCIUM INJECTION 350 MG
400.0000 mg/m2 | Freq: Once | INTRAVENOUS | Status: AC
  Administered 2020-08-07: 808 mg via INTRAVENOUS
  Filled 2020-08-07: qty 40.4

## 2020-08-07 MED ORDER — SODIUM CHLORIDE 0.9 % IV SOLN
1440.0000 mg/m2 | INTRAVENOUS | Status: DC
  Administered 2020-08-07: 3150 mg via INTRAVENOUS
  Filled 2020-08-07: qty 63

## 2020-08-07 MED ORDER — DEXAMETHASONE 4 MG PO TABS: 4.0000 mg | ORAL_TABLET | Freq: Every day | ORAL | 0 refills | Status: DC

## 2020-08-07 MED ORDER — DEXTROSE 5 % IV SOLN: Freq: Once | INTRAVENOUS | Status: AC

## 2020-08-07 MED ORDER — SODIUM CHLORIDE 0.9 % IV SOLN: INTRAVENOUS | Status: DC

## 2020-08-07 MED ORDER — OXALIPLATIN CHEMO INJECTION 100 MG/20ML
67.0000 mg/m2 | Freq: Once | INTRAVENOUS | Status: AC
  Administered 2020-08-07: 145 mg via INTRAVENOUS
  Filled 2020-08-07: qty 10

## 2020-08-07 MED ORDER — FLUOROURACIL CHEMO INJECTION 500 MG/10ML
160.0000 mg/m2 | Freq: Once | INTRAVENOUS | Status: AC
  Administered 2020-08-07: 350 mg via INTRAVENOUS
  Filled 2020-08-07: qty 7

## 2020-08-07 MED ORDER — ATROPINE SULFATE 1 MG/ML IJ SOLN
INTRAMUSCULAR | Status: AC
  Filled 2020-08-07: qty 1

## 2020-08-07 MED ORDER — SODIUM CHLORIDE 0.9 % IV SOLN
10.0000 mg | Freq: Once | INTRAVENOUS | Status: AC
  Administered 2020-08-07: 10 mg via INTRAVENOUS
  Filled 2020-08-07: qty 10

## 2020-08-07 MED ORDER — PALONOSETRON HCL INJECTION 0.25 MG/5ML
INTRAVENOUS | Status: AC
  Filled 2020-08-07: qty 5

## 2020-08-07 MED ORDER — SODIUM CHLORIDE 0.9 % IV SOLN
Freq: Once | INTRAVENOUS | Status: AC
Start: 2020-08-07 — End: 2020-08-07
  Filled 2020-08-07: qty 150

## 2020-08-07 MED ORDER — SODIUM CHLORIDE 0.9 % IV SOLN
5.0000 mg/kg | Freq: Once | INTRAVENOUS | Status: AC
  Administered 2020-08-07: 400 mg via INTRAVENOUS
  Filled 2020-08-07: qty 16

## 2020-08-07 NOTE — Assessment & Plan Note (Signed)
He tolerated treatment very poorly I plan further dose adjustment of both bolus 5-FU infusion and continuous 5-FU infusion I explained to the patient and his wife the importance of hydration, frequent small meals and to take Imodium alternate with Lomotil on a scheduled basis

## 2020-08-07 NOTE — Assessment & Plan Note (Signed)
This is due to recent weight loss and side effects of treatment As above, I plan to reduce the dose of treatment

## 2020-08-07 NOTE — Assessment & Plan Note (Signed)
I spent a lot of time educating patient and family to take scheduled Imodium and Lomotil I have discontinue his furosemide and iron supplement We discussed the importance of frequent small meals If this remains uncontrolled despite dose adjustment, he might benefit from octreotide

## 2020-08-07 NOTE — Assessment & Plan Note (Signed)
He has slight acute on chronic renal failure, likely due to dehydration from diarrhea As above, we discussed the importance of frequent small meals and lots of fluids to stay hydrated

## 2020-08-07 NOTE — Progress Notes (Signed)
[facility] OFFICE PROGRESS NOTE  Patient Care Team: [name], MD as PCP - General (Family Medicine) Dalton-Bethea, Fabio Asa, MD as Referring Physician (Physical Medicine and Rehabilitation) Derek Jack, MD as Consulting Physician (Medical Oncology)  ASSESSMENT & PLAN:  Adenocarcinoma of sigmoid colon [facility]) He tolerated treatment very poorly I plan further dose adjustment of both bolus 5-FU infusion and continuous 5-FU infusion I explained to the patient and his wife the importance of hydration, frequent small meals and to take Imodium alternate with Lomotil on a scheduled basis  Pancytopenia, acquired ([place]) This is due to recent weight loss and side effects of treatment As above, I plan to reduce the dose of treatment  Malignant cachexia ([place]) Recommend a trial of low-dose steroids I discussed side effects with his family  Loss of weight I will reduce the dose of his treatment according to recent weight loss Discussed the importance of frequent small meals and high-protein intake  Diarrhea due to drug I spent a lot of time educating patient and family to take scheduled Imodium and Lomotil I have discontinue his furosemide and iron supplement We discussed the importance of frequent small meals If this remains uncontrolled despite dose adjustment, he might benefit from octreotide  Acute prerenal failure ([place]) He has slight acute on chronic renal failure, likely due to dehydration from diarrhea As above, we discussed the importance of frequent small meals and lots of fluids to stay hydrated  No orders of the defined types were placed in this encounter.   All questions were answered. The patient knows to call the clinic with any problems, questions or concerns. The total time spent in the appointment was 40 minutes encounter with patients including review of chart and various tests results, discussions about plan of care and coordination of care plan    Heath Lark, MD 08/07/2020 11:15 AM  INTERVAL HISTORY: Please see below for problem oriented charting. He returns for chemotherapy today He has lost a lot of weight due to frequent diarrhea 6-8 times per day According to his caregiver, he has not been taking consistent doses of Imodium or Lomotil He does have some loss of appetite No recent nausea /vomiting No mucositis or difficulties with swallowing The patient denies any recent signs or symptoms of bleeding such as spontaneous epistaxis, hematuria or hematochezia.  SUMMARY OF ONCOLOGIC HISTORY: Oncology History  Adenocarcinoma of sigmoid colon ([place])  11/20/2015 Imaging   CT abd/pelvis- Focal sigmoid diverticulitis is noted without abscess formation. Focal narrowing and wall thickening of the sigmoid colon is noted in this area most likely due to inflammation, but neoplasm cannot be excluded, and sigmoidoscopy is recommended for further evaluation.   03/20/2016 Procedure   Colonoscopy by Dr. Laural Golden- Malignant partially obstructing tumor in the distal sigmoid colon. Biopsied. - Incomplete exam.    03/23/2016 Pathology Results   Colon, biopsy, sigmoid mass - ADENOCARCINOMA.    03/23/2016 Procedure   Low anterior resection with transanal anastomosis    03/26/2016 Pathology Results   Colon, segmental resection for tumor, sigmoid - INVASIVE WELL DIFFERENTIATED ADENOCARCINOMA, SPANNING 5 CM IN GREATEST DIMENSION. - TUMOR INVADES THROUGH MUSCULARIS PROPRIA TO INVOLVE SUBSEROSAL SOFT TISSUES. - UNDERLYING MARKED ACUTE AND CHRONIC INFLAMMATION WITH GIANT CELL REACTION AND ABSCESS FORMATION EXTENDING TO THE SEROSAL SURFACE AND INVOLVING THE MESENTERIC TISSUE. - MARGINS ARE NEGATIVE FOR TUMOR. - FOURTEEN BENIGN LYMPH NODES WHICH DEMONSTRATE VARIABLE INVOLVEMENT WITH ACUTE AND CHRONIC INFLAMMATION WITH ASSOCIATED GIANT CELLS WITHOUT TUMOR IDENTIFIED (0/14).    04/08/2016  Cancer Staging   Cancer Staging Adenocarcinoma of sigmoid colon  [facility]) Staging form: Colon and Rectum, AJCC 8th Edition - Pathologic stage from 04/01/2016: Stage IIA (pT3, pN0, cM0) - Signed by Baird Cancer, PA-C on 04/01/2016     07/09/2016 Imaging   CT abd/pelvis- No evidence of metastatic disease or other acute findings.  Bilateral nonobstructing renal calculi and mild bilateral renal parenchymal scarring.  Stable mildly enlarged prostate gland and findings of chronic bladder outlet obstruction.    08/16/2018 -  Chemotherapy    Patient is on Treatment Plan: COLORECTAL FOLFOXIRI          REVIEW OF SYSTEMS:   Constitutional: Denies fevers, chills  Eyes: Denies blurriness of vision Ears, nose, mouth, throat, and face: Denies mucositis or sore throat Respiratory: Denies cough, dyspnea or wheezes Cardiovascular: Denies palpitation, chest discomfort or lower extremity swelling Skin: Denies abnormal skin rashes Lymphatics: Denies new lymphadenopathy or easy bruising Neurological:Denies numbness, tingling or new weaknesses Behavioral/Psych: Mood is stable, no new changes  All other systems were reviewed with the patient and are negative.  I have reviewed the past medical history, past surgical history, social history and family history with the patient and they are unchanged from previous note.  ALLERGIES:  is allergic to penicillin g.  MEDICATIONS:  Current Outpatient Medications  Medication Sig Dispense Refill   allopurinol (ZYLOPRIM) 300 MG tablet Take 1 tablet (300 mg total) by mouth daily. 30 tablet 6   amLODipine (NORVASC) 10 MG tablet Take 10 mg by mouth daily.     dexamethasone (DECADRON) 4 MG tablet Take 1 tablet (4 mg total) by mouth daily. Take in the morning with food 30 tablet 0   diphenoxylate-atropine (LOMOTIL) 2.5-0.025 MG tablet TAKE 2 AFTER FIRST ONSET OF DIARRHEA OR LOOSE STOOL, THEN 1 AFTER EACH LOOSE STOOL. 60 tablet 0   donepezil (ARICEPT) 10 MG tablet TAKE (1) TABLET BY MOUTH AT BEDTIME. 30 tablet 6   insulin  glargine (LANTUS) 100 UNIT/ML injection Inject 30 Units into the skin at bedtime.      loperamide (IMODIUM A-D) 2 MG tablet Take 2 at onset of diarrhea, then 1 after each watery bowel movement. 100 tablet 1   memantine (NAMENDA) 10 MG tablet Take 1 tablet (10 mg total) by mouth 2 (two) times daily. 60 tablet 5   metoprolol succinate (TOPROL-XL) 50 MG 24 hr tablet Take 50 mg by mouth daily. Take with or immediately following a meal.     prochlorperazine (COMPAZINE) 10 MG tablet Take 1 tablet (10 mg total) by mouth every 6 (six) hours as needed (Nausea or vomiting). 60 tablet 1   scopolamine (TRANSDERM-SCOP) 1 MG/3DAYS Place 1 patch (1.5 mg total) onto the skin every 3 (three) days. 10 patch 12   No current facility-administered medications for this visit.   Facility-Administered Medications Ordered in Other Visits  Medication Dose Route Frequency Provider Last Rate Last Admin   0.9 %  sodium chloride infusion   Intravenous Continuous Alvy Bimler, Malaki Koury, MD 50 mL/hr at 08/07/20 1016 New Bag at 08/07/20 1016   fluorouracil (ADRUCIL) 3,150 mg in sodium chloride 0.9 % 87 mL chemo infusion  1,440 mg/m2 (Order-Specific) Intravenous 1 day or 1 dose Santiaga Butzin, MD       fluorouracil (ADRUCIL) chemo injection 350 mg  160 mg/m2 (Order-Specific) Intravenous Once Alvy Bimler, Willies Laviolette, MD       heparin lock flush 100 unit/mL  500 Units Intracatheter Once PRN Derek Jack, MD  leucovorin 808 mg in dextrose 5 % 250 mL infusion  400 mg/m2 (Treatment Plan Recorded) Intravenous Once Alvy Bimler, Denetta Fei, MD       oxaliplatin (ELOXATIN) 145 mg in dextrose 5 % 500 mL chemo infusion  67 mg/m2 (Order-Specific) Intravenous Once Alvy Bimler, Leeam Cedrone, MD       sodium chloride flush (NS) 0.9 % injection 10 mL  10 mL Intracatheter PRN Derek Jack, MD        PHYSICAL EXAMINATION: ECOG PERFORMANCE STATUS:  GENERAL:alert, no distress and comfortable SNEURO: alert & oriented x 3 with fluent speech, no focal motor/sensory  deficits  LABORATORY DATA:  I have reviewed the data as listed    Component Value Date/Time   NA 138 08/07/2020 0817   K 3.0 (L) 08/07/2020 0817   CL 106 08/07/2020 0817   CO2 23 08/07/2020 0817   GLUCOSE 112 (H) 08/07/2020 0817   BUN 12 08/07/2020 0817   CREATININE 1.48 (H) 08/07/2020 0817   CREATININE 1.24 11/20/2015 1051   CALCIUM 8.7 (L) 08/07/2020 0817   PROT 6.2 (L) 08/07/2020 0817   ALBUMIN 3.7 08/07/2020 0817   AST 14 (L) 08/07/2020 0817   ALT 12 08/07/2020 0817   ALKPHOS 144 (H) 08/07/2020 0817   BILITOT 0.8 08/07/2020 0817   GFRNONAA 53 (L) 08/07/2020 0817   GFRAA 58 (L) 09/14/2019 0919    No results found for: SPEP, UPEP  Lab Results  Component Value Date   WBC 5.8 08/07/2020   NEUTROABS 4.5 08/07/2020   HGB 10.0 (L) 08/07/2020   HCT 29.6 (L) 08/07/2020   MCV 90.0 08/07/2020   PLT 133 (L) 08/07/2020      Chemistry      Component Value Date/Time   NA 138 08/07/2020 0817   K 3.0 (L) 08/07/2020 0817   CL 106 08/07/2020 0817   CO2 23 08/07/2020 0817   BUN 12 08/07/2020 0817   CREATININE 1.48 (H) 08/07/2020 0817   CREATININE 1.24 11/20/2015 1051      Component Value Date/Time   CALCIUM 8.7 (L) 08/07/2020 0817   ALKPHOS 144 (H) 08/07/2020 0817   AST 14 (L) 08/07/2020 0817   ALT 12 08/07/2020 0817   BILITOT 0.8 08/07/2020 0817       RADIOGRAPHIC STUDIES: I have personally reviewed the radiological images as listed and agreed with the findings in the report. No results found.

## 2020-08-07 NOTE — Progress Notes (Signed)
Labs reviewed with MD today. Dose reduction done per MD. Will continue to monitor Potassium per MD.Proceed with treatment.   Treatment given per orders. Patient tolerated it well without problems. Vitals stable and discharged home from clinic ambulatory. Follow up as scheduled.

## 2020-08-07 NOTE — Patient Instructions (Signed)
[facility]  Discharge Instructions: Thank you for choosing [facility] to provide your oncology and hematology care.  If you have a lab appointment with the Cancer Center, please come in thru the Main Entrance and check in at the main information desk.  Wear comfortable clothing and clothing appropriate for easy access to any Portacath or PICC line.   We strive to give you quality time with your provider. You may need to reschedule your appointment if you arrive late (15 or more minutes).  Arriving late affects you and other patients whose appointments are after yours.  Also, if you miss three or more appointments without notifying the office, you may be dismissed from the clinic at the provider's discretion.      For prescription refill requests, have your pharmacy contact our office and allow 72 hours for refills to be completed.        To help prevent nausea and vomiting after your treatment, we encourage you to take your nausea medication as directed.  BELOW ARE SYMPTOMS THAT SHOULD BE REPORTED IMMEDIATELY: *FEVER GREATER THAN 100.4 F (38 C) OR HIGHER *CHILLS OR SWEATING *NAUSEA AND VOMITING THAT IS NOT CONTROLLED WITH YOUR NAUSEA MEDICATION *UNUSUAL SHORTNESS OF BREATH *UNUSUAL BRUISING OR BLEEDING *URINARY PROBLEMS (pain or burning when urinating, or frequent urination) *BOWEL PROBLEMS (unusual diarrhea, constipation, pain near the anus) TENDERNESS IN MOUTH AND THROAT WITH OR WITHOUT PRESENCE OF ULCERS (sore throat, sores in mouth, or a toothache) UNUSUAL RASH, SWELLING OR PAIN  UNUSUAL VAGINAL DISCHARGE OR ITCHING   Items with * indicate a potential emergency and should be followed up as soon as possible or go to the Emergency Department if any problems should occur.  Please show the CHEMOTHERAPY ALERT CARD or IMMUNOTHERAPY ALERT CARD at check-in to the Emergency Department and triage nurse.  Should you have questions after your visit or need to cancel  or reschedule your appointment, please contact [facility] [phone]  and follow the prompts.  Office hours are 8:00 a.m. to 4:30 p.m. Monday - Friday. Please note that voicemails left after 4:00 p.m. may not be returned until the following business day.  We are closed weekends and major holidays. You have access to a nurse at all times for urgent questions. Please call the main number to the clinic [phone] and follow the prompts.  For any non-urgent questions, you may also contact your provider using MyChart. We now offer e-Visits for anyone 18 and older to request care online for non-urgent symptoms. For details visit [email or web address].   Also download the MyChart app! Go to the app store, search "MyChart", open the app, select [facility], and log in with your MyChart username and password.  Due to Covid, a mask is required upon entering the hospital/clinic. If you do not have a mask, one will be given to you upon arrival. For doctor visits, patients may have 1 support person aged 18 or older with them. For treatment visits, patients cannot have anyone with them due to current Covid guidelines and our immunocompromised population.  

## 2020-08-07 NOTE — Assessment & Plan Note (Signed)
I will reduce the dose of his treatment according to recent weight loss Discussed the importance of frequent small meals and high-protein intake

## 2020-08-07 NOTE — Assessment & Plan Note (Signed)
Recommend a trial of low-dose steroids I discussed side effects with his family

## 2020-08-08 LAB — CEA: CEA: 28.9 ng/mL — ABNORMAL HIGH (ref 0.0–4.7)

## 2020-08-09 ENCOUNTER — Other Ambulatory Visit: Payer: Self-pay

## 2020-08-09 ENCOUNTER — Inpatient Hospital Stay (HOSPITAL_COMMUNITY): Payer: BC Managed Care – PPO | Attending: Hematology

## 2020-08-09 VITALS — BP 114/72 | HR 60 | Temp 98.3°F | Resp 18

## 2020-08-09 DIAGNOSIS — Z5112 Encounter for antineoplastic immunotherapy: Secondary | ICD-10-CM | POA: Insufficient documentation

## 2020-08-09 DIAGNOSIS — Z5189 Encounter for other specified aftercare: Secondary | ICD-10-CM | POA: Insufficient documentation

## 2020-08-09 DIAGNOSIS — C787 Secondary malignant neoplasm of liver and intrahepatic bile duct: Secondary | ICD-10-CM | POA: Diagnosis not present

## 2020-08-09 DIAGNOSIS — Z95828 Presence of other vascular implants and grafts: Secondary | ICD-10-CM

## 2020-08-09 DIAGNOSIS — Z5111 Encounter for antineoplastic chemotherapy: Secondary | ICD-10-CM | POA: Diagnosis present

## 2020-08-09 DIAGNOSIS — Z79899 Other long term (current) drug therapy: Secondary | ICD-10-CM | POA: Insufficient documentation

## 2020-08-09 DIAGNOSIS — C187 Malignant neoplasm of sigmoid colon: Secondary | ICD-10-CM | POA: Diagnosis present

## 2020-08-09 MED ORDER — HEPARIN SOD (PORK) LOCK FLUSH 100 UNIT/ML IV SOLN
500.0000 [IU] | Freq: Once | INTRAVENOUS | Status: AC | PRN
  Administered 2020-08-09: 500 [IU]

## 2020-08-09 MED ORDER — SODIUM CHLORIDE 0.9% FLUSH
10.0000 mL | INTRAVENOUS | Status: DC | PRN
  Administered 2020-08-09: 10 mL

## 2020-08-09 MED ORDER — PEGFILGRASTIM-CBQV 6 MG/0.6ML ~~LOC~~ SOSY
6.0000 mg | PREFILLED_SYRINGE | Freq: Once | SUBCUTANEOUS | Status: AC
  Administered 2020-08-09: 6 mg via SUBCUTANEOUS

## 2020-08-09 MED ORDER — PEGFILGRASTIM-CBQV 6 MG/0.6ML ~~LOC~~ SOSY
PREFILLED_SYRINGE | SUBCUTANEOUS | Status: AC
  Filled 2020-08-09: qty 0.6

## 2020-08-09 NOTE — Progress Notes (Signed)
Troy Smith presents today for Udenyca injection and 5FU pump stop per the provider's orders.  Stable during injection administration without incident; injection site WNL; see MAR for injection details.  5FU pump stop after infusing 46 hours.  Patients port flushed without difficulty.  Good blood return noted with no bruising or swelling noted at site.  Band aid applied.  Patient tolerated procedure well and without incident.  VSS with discharge and left in satisfactory condition with no s/s of distress noted.  No questions or complaints noted at this time.

## 2020-08-09 NOTE — Patient Instructions (Signed)
[facility]  Discharge Instructions: Thank you for choosing [facility] to provide your oncology and hematology care.  If you have a lab appointment with the [facility], please come in thru the Main Entrance and check in at the main information desk.  Wear comfortable clothing and clothing appropriate for easy access to any Portacath or PICC line.   We strive to give you quality time with your provider. You may need to reschedule your appointment if you arrive late (15 or more minutes).  Arriving late affects you and other patients whose appointments are after yours.  Also, if you miss three or more appointments without notifying the office, you may be dismissed from the clinic at the provider's discretion.      For prescription refill requests, have your pharmacy contact our office and allow 72 hours for refills to be completed.    Today you received the following chemotherapy and/or immunotherapy agents Udenyca injection and 5FU pump stop     To help prevent nausea and vomiting after your treatment, we encourage you to take your nausea medication as directed.  BELOW ARE SYMPTOMS THAT SHOULD BE REPORTED IMMEDIATELY: *FEVER GREATER THAN 100.4 F (38 C) OR HIGHER *CHILLS OR SWEATING *NAUSEA AND VOMITING THAT IS NOT CONTROLLED WITH YOUR NAUSEA MEDICATION *UNUSUAL SHORTNESS OF BREATH *UNUSUAL BRUISING OR BLEEDING *URINARY PROBLEMS (pain or burning when urinating, or frequent urination) *BOWEL PROBLEMS (unusual diarrhea, constipation, pain near the anus) TENDERNESS IN MOUTH AND THROAT WITH OR WITHOUT PRESENCE OF ULCERS (sore throat, sores in mouth, or a toothache) UNUSUAL RASH, SWELLING OR PAIN  UNUSUAL VAGINAL DISCHARGE OR ITCHING   Items with * indicate a potential emergency and should be followed up as soon as possible or go to the Emergency Department if any problems should occur.  Please show the CHEMOTHERAPY ALERT CARD or IMMUNOTHERAPY ALERT CARD at  check-in to the Emergency Department and triage nurse.  Should you have questions after your visit or need to cancel or reschedule your appointment, please contact [facility] [phone]  and follow the prompts.  Office hours are 8:00 a.m. to 4:30 p.m. Monday - Friday. Please note that voicemails left after 4:00 p.m. may not be returned until the following business day.  We are closed weekends and major holidays. You have access to a nurse at all times for urgent questions. Please call the main number to the clinic [phone] and follow the prompts.  For any non-urgent questions, you may also contact your provider using MyChart. We now offer e-Visits for anyone 12 and older to request care online for non-urgent symptoms. For details visit [email or web address].   Also download the MyChart app! Go to the app store, search "MyChart", open the app, select [facility], and log in with your MyChart username and password.  Due to Covid, a mask is required upon entering the hospital/clinic. If you do not have a mask, one will be given to you upon arrival. For doctor visits, patients may have 1 support person aged 64 or older with them. For treatment visits, patients cannot have anyone with them due to current Covid guidelines and our immunocompromised population.

## 2020-08-20 NOTE — Progress Notes (Signed)
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 Focal narrowing and wall thickening of the sigmoid colon is noted in this area most likely due to inflammation, but neoplasm cannot be excluded, and sigmoidoscopy is recommended for further evaluation.   03/20/2016 Procedure   Colonoscopy by Dr. Laural Golden- Malignant partially obstructing tumor in the distal sigmoid colon. Biopsied. - Incomplete exam.    03/23/2016 Pathology Results   Colon, biopsy, sigmoid mass - ADENOCARCINOMA.    03/23/2016 Procedure   Low anterior resection with transanal anastomosis    03/26/2016 Pathology Results   Colon, segmental resection for tumor, sigmoid - INVASIVE WELL DIFFERENTIATED ADENOCARCINOMA, SPANNING 5 CM IN GREATEST DIMENSION. - TUMOR INVADES THROUGH MUSCULARIS PROPRIA TO INVOLVE SUBSEROSAL SOFT TISSUES. - UNDERLYING MARKED ACUTE AND CHRONIC INFLAMMATION WITH GIANT CELL REACTION AND ABSCESS FORMATION EXTENDING TO THE SEROSAL SURFACE AND INVOLVING THE MESENTERIC TISSUE. - MARGINS ARE NEGATIVE FOR TUMOR. - FOURTEEN BENIGN LYMPH NODES WHICH DEMONSTRATE VARIABLE INVOLVEMENT WITH ACUTE AND CHRONIC INFLAMMATION WITH ASSOCIATED GIANT CELLS WITHOUT TUMOR IDENTIFIED (0/14).    04/08/2016 Cancer Staging   Cancer Staging Adenocarcinoma of sigmoid colon [facility]) Staging form: Colon and Rectum, AJCC 8th Edition - Pathologic stage from 04/01/2016: Stage IIA (pT3, pN0, cM0) - Signed by  Baird Cancer, PA-C on 04/01/2016     07/09/2016 Imaging   CT abd/pelvis- No evidence of metastatic disease or other acute findings.  Bilateral nonobstructing renal calculi and mild bilateral renal parenchymal scarring.  Stable mildly enlarged prostate gland and findings of chronic bladder outlet obstruction.    08/16/2018 -  Chemotherapy    Patient is on Treatment Plan: COLORECTAL FOLFOXIRI          CANCER STAGING: Cancer Staging Adenocarcinoma of sigmoid colon [facility]) Staging form: Colon and Rectum, AJCC 8th Edition - Pathologic stage from 04/01/2016: Stage IIA (pT3, pN0, cM0) - Signed by Baird Cancer, PA-C on 04/01/2016  Virtual Visit via Video Note  I connected with Olean Ree on @TODAY @ at  9:00 AM EDT by a video enabled telemedicine application and verified that I am speaking with the correct person using two identifiers.  Location: Patient: [name] in the office Provider: Derek Jack, MD at home Other persons present: Margaretmary Lombard, RN   I discussed the limitations of evaluation and management by telemedicine and the availability of in person appointments. The patient expressed understanding and agreed to proceed.  INTERVAL HISTORY:  [name], a 65 y.o. male, returns for routine follow-up and consideration for next cycle of chemotherapy. Fernando was last seen on 07/24/20.  Due for cycle #17 of FOLFOXIRI today.   Overall, he tells me he has been feeling pretty well. He has lost around 13 lbs over the past month. He currently reports diarrhea 2-3 times per day, but denies nausea. His appetite is slightly improved but still below baseline, but he denies pain in the abdomen and rectum. He denies nosebleeds or other bleeding issues. He is not drinking Boost/Ensure, and he reports  being severely fatigued intermittently.   Overall, he feels ready for next cycle of chemo today.   REVIEW OF SYSTEMS:  Review of Systems  Constitutional:   Positive for appetite change.  HENT:   Negative for nosebleeds.   Gastrointestinal:  Positive for diarrhea. Negative for abdominal pain, blood in stool and nausea.  Hematological:  Does not bruise/bleed easily.  All other systems reviewed and are negative.  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[place]) 03/23/2016   Carpal tunnel syndrome 05/02/2014   Bilateral   Cervical disc disorder with radiculopathy of cervical region 05/02/2014   Left C8   Diabetes mellitus without complication ([place])    Hypertension    Port-A-Cath in place 08/10/2018   Past Surgical History:  Procedure Laterality Date   BIOPSY  03/20/2016   Procedure: BIOPSY;  Surgeon: Rogene Houston, MD;  Location[facility];  Service: Endoscopy;;  colon   BOWEL RESECTION N/A 03/23/2016   Procedure: LOW ANTERIOR RESECTION;  Surgeon: Aviva Signs, MD;  Location: [facility] ORS;  Service: General;  Laterality: N/A;   COLON SURGERY  01/2019   Done at [place] 03/20/2016   Procedure: COLONOSCOPY;  Surgeon: Rogene Houston, MD;  Location: [facility];  Service: Endoscopy;  Laterality: N/A;  730   COLONOSCOPY N/A 07/10/2016   Procedure: COLONOSCOPY;  Surgeon: Rogene Houston, MD;  Location: [facility];  Service: Endoscopy;  Laterality: N/A;  1040   NO PAST SURGERIES     POLYPECTOMY  07/10/2016   Procedure: POLYPECTOMY;  Surgeon: Rogene Houston, MD;  Location: [facility];  Service: Endoscopy;;  colon   PORTACATH PLACEMENT Left 08/05/2018   Procedure: INSERTION PORT-A-CATH;  Surgeon: Aviva Signs, MD;  Location: [facility] ORS;  Service: General;  Laterality: Left;   TONSILLECTOMY      SOCIAL HISTORY:  Social History   Socioeconomic History   Marital status: Married    Spouse name: Not on file   Number of children: Not on file   Years of education: Not on file   Highest education level: Not on file  Occupational History   Not on file  Tobacco Use    Smoking status: Never   Smokeless tobacco: Never  Vaping Use   Vaping Use: Never used  Substance and Sexual Activity   Alcohol use: Yes    Comment: rarely   Drug use: No   Sexual activity: Not on file  Other Topics Concern   Not on file  Social History Narrative   Not on file   Social Determinants of Health   Financial Resource Strain: Not on file  Food Insecurity: Not on file  Transportation Needs: No Transportation Needs   Lack of Transportation (Medical): No   Lack of Transportation (Non-Medical): No  Physical Activity: Inactive   Days of Exercise per Week: 0 days   Minutes of Exercise per Session: 0 min  Stress: Not on file  Social Connections: Not on file  Intimate Partner Violence: Not At Risk   Fear of Current or Ex-Partner: No   Emotionally Abused: No   Physically Abused: No   Sexually Abused: No    FAMILY HISTORY:  Family History  Problem Relation Age of Onset   COPD Mother    Melanoma Father     CURRENT MEDICATIONS:  Current Outpatient Medications  Medication Sig Dispense Refill   allopurinol (ZYLOPRIM) 300 MG tablet Take 1 tablet (  300 mg total) by mouth daily. 30 tablet 6   amLODipine (NORVASC) 10 MG tablet Take 10 mg by mouth daily.     dexamethasone (DECADRON) 4 MG tablet Take 1 tablet (4 mg total) by mouth daily. Take in the morning with food 30 tablet 0   diphenoxylate-atropine (LOMOTIL) 2.5-0.025 MG tablet TAKE 2 AFTER FIRST ONSET OF DIARRHEA OR LOOSE STOOL, THEN 1 AFTER EACH LOOSE STOOL. 60 tablet 0   donepezil (ARICEPT) 10 MG tablet TAKE (1) TABLET BY MOUTH AT BEDTIME. 30 tablet 6   insulin glargine (LANTUS) 100 UNIT/ML injection Inject 30 Units into the skin at bedtime.      loperamide (IMODIUM A-D) 2 MG tablet Take 2 at onset of diarrhea, then 1 after each watery bowel movement. 100 tablet 1   memantine (NAMENDA) 10 MG tablet Take 1 tablet (10 mg total) by mouth 2 (two) times daily. 60 tablet 5   metoprolol succinate (TOPROL-XL) 50 MG 24 hr  tablet Take 50 mg by mouth daily. Take with or immediately following a meal.     prochlorperazine (COMPAZINE) 10 MG tablet Take 1 tablet (10 mg total) by mouth every 6 (six) hours as needed (Nausea or vomiting). 60 tablet 1   scopolamine (TRANSDERM-SCOP) 1 MG/3DAYS Place 1 patch (1.5 mg total) onto the skin every 3 (three) days. 10 patch 12   No current facility-administered medications for this visit.   Facility-Administered Medications Ordered in Other Visits  Medication Dose Route Frequency Provider Last Rate Last Admin   heparin lock flush 100 unit/mL  500 Units Intracatheter Once PRN Derek Jack, MD       sodium chloride flush (NS) 0.9 % injection 10 mL  10 mL Intracatheter PRN Derek Jack, MD        ALLERGIES:  Allergies  Allergen Reactions   Penicillin G Nausea And Vomiting    Performance status (ECOG): 1 - Symptomatic but completely ambulatory  There were no vitals filed for this visit. Wt Readings from Last 3 Encounters:  08/21/20 177 lb 14.4 oz (80.7 kg)  08/07/20 181 lb 12.8 oz (82.5 kg)  07/24/20 191 lb 6.4 oz (86.8 kg)    LABORATORY DATA:  I have reviewed the labs as listed.  CBC Latest Ref Rng & Units 08/07/2020 07/24/2020 07/10/2020  WBC 4.0 - 10.5 K/uL 5.8 6.6 11.6(H)  Hemoglobin 13.0 - 17.0 g/dL 10.0(L) 10.0(L) 11.5(L)  Hematocrit 39.0 - 52.0 % 29.6(L) 30.2(L) 34.9(L)  Platelets 150 - 400 K/uL 133(L) 123(L) 124(L)   CMP Latest Ref Rng & Units 08/21/2020 08/07/2020 07/24/2020  Glucose 70 - 99 mg/dL 102(H) 112(H) 148(H)  BUN 8 - 23 mg/dL 14 12 12   Creatinine 0.61 - 1.24 mg/dL 1.39(H) 1.48(H) 1.29(H)  Sodium 135 - 145 mmol/L 137 138 137  Potassium 3.5 - 5.1 mmol/L 3.5 3.0(L) 3.3(L)  Chloride 98 - 111 mmol/L 107 106 108  CO2 22 - 32 mmol/L 22 23 22   Calcium 8.9 - 10.3 mg/dL 8.4(L) 8.7(L) 8.7(L)  Total Protein 6.5 - 8.1 g/dL 5.9(L) 6.2(L) 5.8(L)  Total Bilirubin 0.3 - 1.2 mg/dL 0.9 0.8 0.6  Alkaline Phos 38 - 126 U/L 113 144(H) 145(H)  AST 15 - 41  U/L 13(L) 14(L) 14(L)  ALT 0 - 44 U/L 13 12 12     DIAGNOSTIC IMAGING:  I have independently reviewed the scans and discussed with the patient. No results found.   ASSESSMENT:  1.  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 -Laparoscopic partial hepatectomy, redo low anterior resection on 02/01/2019, pathology showing YPT3Y PN 0.  Positive circumferential/radial/mesenteric margin. -6 more cycles of FOLFOX completed on 05/31/2019. -Xeloda with radiation from 06/26/2019 through 08/03/2019. -CT CAP on 09/14/2019 shows postoperative changes in the rectosigmoid resection and reanastomosis with interval increase in posttreatment perirectal and presacral soft tissue thickening consistent with radiation changes.  No adenopathy.  Multiple stable small bilateral pulmonary nodules, likely benign.  No new evidence of metastatic disease.  Unchanged splenomegaly. - PET scan on 04/08/2020 done secondary to increase in CEA shows abnormal hypermetabolic soft tissue in the presacral region suspicious for recurrence.  Sclerosis involving sacrum.  Small left axillary lymph nodes favored to be reactive. - MRI pelvis on 04/26/2020 with enhancing lesion involving S1 segment of the sacrum concerning for metastatic disease.  Peripherally enhancing soft tissue nodule within the presacral soft tissues measuring 2.4 x 2.1 cm. - PET scan on 07/01/2020 showed hypermetabolic soft tissue lesion in the presacral space shows interval progression.  Destructive changes in the sacrum also mildly progressive.  Interval development of diffuse marrow uptake, likely from growth factor. - I talked to Dr. Oneita Kras from [facility]ology [facility]  She has recommended 2 to 3 months of chemotherapy followed by repeat pelvic MRI. - FOLFOX and bevacizumab started on  06/26/2020.   PLAN:  1.  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  Denied any nausea or vomiting.  However he continued to have diarrhea.  No bleeding issues reported. - Reviewed his labs today which showed normal LFTs.  CBC was grossly normal with ANC and platelet count 112.  He has normocytic anemia from myelosuppression from chemo. - We will proceed with dose reduced FOLFOX and bevacizumab. - He had good response with improvement in his CEA level since the start of treatment.  He will proceed with cycle 5 today.  I plan to repeat scans after 6 cycles.  RTC 2 weeks for follow-up.   2.  Diarrhea: - He is taking 2 tablets of Lomotil 4 times daily and Imodium as needed after each watery bowel movement.  He has diarrhea after he eats.  Recommend "brat" diet.   3.  Hypomagnesemia: - Continue magnesium twice daily.   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   5.  Gout: - He has not taken allopurinol in the past few weeks.   6.  Weight loss: - He lost about 13 pounds in the last 4 to 6 weeks.  His chemotherapy was cut back 2 weeks ago.  He lost about 3 pounds since last 2 weeks. - He was started on dexamethasone 4 mg in the mornings 2 weeks ago.  He did not report any major improvement in appetite. - We will start him on Megace 400 mg twice daily.   7.  Lower extremity swelling: - He has not required Lasix on a daily basis.   Orders placed this encounter:  No orders of the defined types were placed in this encounter.  I provided 30 minutes of non-face-to-face time during this encounter.  Derek Jack, MD [place] [phone]   I, Thana Ates, am acting as a scribe for Dr. Derek Jack.  I, Derek Jack MD, have reviewed the above documentation for accuracy and completeness, and I agree with the above.

## 2020-08-21 ENCOUNTER — Inpatient Hospital Stay (HOSPITAL_COMMUNITY): Payer: BC Managed Care – PPO

## 2020-08-21 ENCOUNTER — Inpatient Hospital Stay (HOSPITAL_BASED_OUTPATIENT_CLINIC_OR_DEPARTMENT_OTHER): Payer: BC Managed Care – PPO | Admitting: Hematology

## 2020-08-21 ENCOUNTER — Other Ambulatory Visit: Payer: Self-pay

## 2020-08-21 ENCOUNTER — Encounter (HOSPITAL_COMMUNITY): Payer: Self-pay | Admitting: Hematology

## 2020-08-21 VITALS — Temp 97.0°F

## 2020-08-21 DIAGNOSIS — R634 Abnormal weight loss: Secondary | ICD-10-CM

## 2020-08-21 DIAGNOSIS — C187 Malignant neoplasm of sigmoid colon: Secondary | ICD-10-CM | POA: Diagnosis not present

## 2020-08-21 DIAGNOSIS — Z95828 Presence of other vascular implants and grafts: Secondary | ICD-10-CM

## 2020-08-21 LAB — COMPREHENSIVE METABOLIC PANEL
ALT: 13 U/L (ref 0–44)
AST: 13 U/L — ABNORMAL LOW (ref 15–41)
Albumin: 3.4 g/dL — ABNORMAL LOW (ref 3.5–5.0)
Alkaline Phosphatase: 113 U/L (ref 38–126)
Anion gap: 8 (ref 5–15)
BUN: 14 mg/dL (ref 8–23)
CO2: 22 mmol/L (ref 22–32)
Calcium: 8.4 mg/dL — ABNORMAL LOW (ref 8.9–10.3)
Chloride: 107 mmol/L (ref 98–111)
Creatinine, Ser: 1.39 mg/dL — ABNORMAL HIGH (ref 0.61–1.24)
GFR, Estimated: 57 mL/min — ABNORMAL LOW (ref >60)
Glucose, Bld: 102 mg/dL — ABNORMAL HIGH (ref 70–99)
Potassium: 3.5 mmol/L (ref 3.5–5.1)
Sodium: 137 mmol/L (ref 135–145)
Total Bilirubin: 0.9 mg/dL (ref 0.3–1.2)
Total Protein: 5.9 g/dL — ABNORMAL LOW (ref 6.5–8.1)

## 2020-08-21 LAB — URINALYSIS, DIPSTICK ONLY
Bilirubin Urine: NEGATIVE
Glucose, UA: NEGATIVE mg/dL
Hgb urine dipstick: NEGATIVE
Ketones, ur: NEGATIVE mg/dL
Leukocytes,Ua: NEGATIVE
Nitrite: NEGATIVE
Protein, ur: NEGATIVE mg/dL
Specific Gravity, Urine: 1.016 (ref 1.005–1.030)
pH: 5 (ref 5.0–8.0)

## 2020-08-21 LAB — CBC WITH DIFFERENTIAL/PLATELET
Abs Immature Granulocytes: 0.01 10*3/uL (ref 0.00–0.07)
Basophils Absolute: 0 10*3/uL (ref 0.0–0.1)
Basophils Relative: 0 %
Eosinophils Absolute: 0.1 10*3/uL (ref 0.0–0.5)
Eosinophils Relative: 1 %
HCT: 28 % — ABNORMAL LOW (ref 39.0–52.0)
Hemoglobin: 9.2 g/dL — ABNORMAL LOW (ref 13.0–17.0)
Immature Granulocytes: 0 %
Lymphocytes Relative: 13 %
Lymphs Abs: 0.8 10*3/uL (ref 0.7–4.0)
MCH: 30.7 pg (ref 26.0–34.0)
MCHC: 32.9 g/dL (ref 30.0–36.0)
MCV: 93.3 fL (ref 80.0–100.0)
Monocytes Absolute: 0.5 10*3/uL (ref 0.1–1.0)
Monocytes Relative: 8 %
Neutro Abs: 4.4 10*3/uL (ref 1.7–7.7)
Neutrophils Relative %: 78 %
Platelets: 112 10*3/uL — ABNORMAL LOW (ref 150–400)
RBC: 3 MIL/uL — ABNORMAL LOW (ref 4.22–5.81)
RDW: 19.1 % — ABNORMAL HIGH (ref 11.5–15.5)
WBC: 5.7 10*3/uL (ref 4.0–10.5)
nRBC: 0 % (ref 0.0–0.2)

## 2020-08-21 MED ORDER — ATROPINE SULFATE 1 MG/ML IJ SOLN
INTRAMUSCULAR | Status: AC
  Filled 2020-08-21: qty 1

## 2020-08-21 MED ORDER — FOSAPREPITANT DIMEGLUMINE INJECTION 150 MG
Freq: Once | INTRAVENOUS | Status: AC
Start: 2020-08-21 — End: 2020-08-21
  Filled 2020-08-21: qty 150

## 2020-08-21 MED ORDER — FLUOROURACIL CHEMO INJECTION 500 MG/10ML
160.0000 mg/m2 | Freq: Once | INTRAVENOUS | Status: AC
  Administered 2020-08-21: 300 mg via INTRAVENOUS
  Filled 2020-08-21: qty 6

## 2020-08-21 MED ORDER — LEUCOVORIN CALCIUM INJECTION 350 MG
400.0000 mg/m2 | Freq: Once | INTRAVENOUS | Status: AC
  Administered 2020-08-21: 808 mg via INTRAVENOUS
  Filled 2020-08-21: qty 40.4

## 2020-08-21 MED ORDER — PALONOSETRON HCL INJECTION 0.25 MG/5ML
0.2500 mg | Freq: Once | INTRAVENOUS | Status: AC
  Administered 2020-08-21: 0.25 mg via INTRAVENOUS

## 2020-08-21 MED ORDER — HEPARIN SOD (PORK) LOCK FLUSH 100 UNIT/ML IV SOLN: 500.0000 [IU] | Freq: Once | INTRAVENOUS | Status: DC | PRN

## 2020-08-21 MED ORDER — SODIUM CHLORIDE 0.9 % IV SOLN
1440.0000 mg/m2 | INTRAVENOUS | Status: DC
  Administered 2020-08-21: 2900 mg via INTRAVENOUS
  Filled 2020-08-21: qty 58

## 2020-08-21 MED ORDER — OXALIPLATIN CHEMO INJECTION 100 MG/20ML
67.0000 mg/m2 | Freq: Once | INTRAVENOUS | Status: AC
  Administered 2020-08-21: 135 mg via INTRAVENOUS
  Filled 2020-08-21: qty 20

## 2020-08-21 MED ORDER — PALONOSETRON HCL INJECTION 0.25 MG/5ML
INTRAVENOUS | Status: AC
  Filled 2020-08-21: qty 5

## 2020-08-21 MED ORDER — SODIUM CHLORIDE 0.9 % IV SOLN
10.0000 mg | Freq: Once | INTRAVENOUS | Status: AC
  Administered 2020-08-21: 10 mg via INTRAVENOUS
  Filled 2020-08-21: qty 10

## 2020-08-21 MED ORDER — SODIUM CHLORIDE 0.9% FLUSH: 10.0000 mL | INTRAVENOUS | Status: DC | PRN

## 2020-08-21 MED ORDER — ATROPINE SULFATE 1 MG/ML IJ SOLN
0.5000 mg | Freq: Once | INTRAMUSCULAR | Status: AC
  Administered 2020-08-21: 0.5 mg via INTRAVENOUS

## 2020-08-21 MED ORDER — SODIUM CHLORIDE 0.9 % IV SOLN
5.0000 mg/kg | Freq: Once | INTRAVENOUS | Status: AC
  Administered 2020-08-21: 400 mg via INTRAVENOUS
  Filled 2020-08-21: qty 16

## 2020-08-21 MED ORDER — SODIUM CHLORIDE 0.9 % IV SOLN: Freq: Once | INTRAVENOUS | Status: AC

## 2020-08-21 MED ORDER — DEXTROSE 5 % IV SOLN: Freq: Once | INTRAVENOUS | Status: AC

## 2020-08-21 MED ORDER — MEGESTROL ACETATE 400 MG/10ML PO SUSP: ORAL | 0 refills | Status: DC

## 2020-08-21 NOTE — Progress Notes (Signed)
Patient presents today for Avastin/FOLFOX with 5FU pump start per providers orders.  Vital signs within parameters for treatment.  Labs pending.  Patient complains of diarrhea after last treatment and belching.  Labs reviewed by MD, message received from Renda Rolls RN/Dr. Delton Coombes patient okay for treatment.   Avastion/FOLFOX given today per MD orders.  Tolerated infusion without adverse affects.  5FU pump start and verified RUN on the screen with the patient.  Vital signs stable.  No complaints at this time.  Discharge from clinic ambulatory in stable condition.  Alert and oriented X 3.  Follow up with [facility] as scheduled.

## 2020-08-21 NOTE — Progress Notes (Signed)
Patients port flushed without difficulty.  Good blood return noted with no bruising or swelling noted at site.  Stable during access and blood draw.  Patient to remain accessed for treatment. 

## 2020-08-21 NOTE — Patient Instructions (Signed)
[facility]  Discharge Instructions: Thank you for choosing [facility] to provide your oncology and hematology care.  If you have a lab appointment with the [facility], please come in thru the Main Entrance and check in at the main information desk.  Wear comfortable clothing and clothing appropriate for easy access to any Portacath or PICC line.   We strive to give you quality time with your provider. You may need to reschedule your appointment if you arrive late (15 or more minutes).  Arriving late affects you and other patients whose appointments are after yours.  Also, if you miss three or more appointments without notifying the office, you may be dismissed from the clinic at the provider's discretion.      For prescription refill requests, have your pharmacy contact our office and allow 72 hours for refills to be completed.    Today you received the following chemotherapy and/or immunotherapy agents Avastin, FOLFOX, 5FU pump start      To help prevent nausea and vomiting after your treatment, we encourage you to take your nausea medication as directed.  BELOW ARE SYMPTOMS THAT SHOULD BE REPORTED IMMEDIATELY: *FEVER GREATER THAN 100.4 F (38 C) OR HIGHER *CHILLS OR SWEATING *NAUSEA AND VOMITING THAT IS NOT CONTROLLED WITH YOUR NAUSEA MEDICATION *UNUSUAL SHORTNESS OF BREATH *UNUSUAL BRUISING OR BLEEDING *URINARY PROBLEMS (pain or burning when urinating, or frequent urination) *BOWEL PROBLEMS (unusual diarrhea, constipation, pain near the anus) TENDERNESS IN MOUTH AND THROAT WITH OR WITHOUT PRESENCE OF ULCERS (sore throat, sores in mouth, or a toothache) UNUSUAL RASH, SWELLING OR PAIN  UNUSUAL VAGINAL DISCHARGE OR ITCHING   Items with * indicate a potential emergency and should be followed up as soon as possible or go to the Emergency Department if any problems should occur.  Please show the CHEMOTHERAPY ALERT CARD or IMMUNOTHERAPY ALERT CARD at check-in  to the Emergency Department and triage nurse.  Should you have questions after your visit or need to cancel or reschedule your appointment, please contact [facility] [phone]  and follow the prompts.  Office hours are 8:00 a.m. to 4:30 p.m. Monday - Friday. Please note that voicemails left after 4:00 p.m. may not be returned until the following business day.  We are closed weekends and major holidays. You have access to a nurse at all times for urgent questions. Please call the main number to the clinic [phone] and follow the prompts.  For any non-urgent questions, you may also contact your provider using MyChart. We now offer e-Visits for anyone 60 and older to request care online for non-urgent symptoms. For details visit [email or web address].   Also download the MyChart app! Go to the app store, search "MyChart", open the app, select [place] [facility], and log in with your MyChart username and password.  Due to Covid, a mask is required upon entering the hospital/clinic. If you do not have a mask, one will be given to you upon arrival. For doctor visits, patients may have 1 support person aged 50 or older with them. For treatment visits, patients cannot have anyone with them due to current Covid guidelines and our immunocompromised population.

## 2020-08-21 NOTE — Progress Notes (Signed)
Patient has been assessed, vital signs and labs have been reviewed by Dr. Katragadda. ANC, Creatinine, LFTs, and Platelets are within treatment parameters per Dr. Katragadda. The patient is good to proceed with treatment at this time. Primary RN and pharmacy aware.  

## 2020-08-21 NOTE — Patient Instructions (Addendum)
[facility] Discharge Instructions  You were seen today by Dr. Katragadda. He went over your recent results, and you received your treatment. Dr. Katragadda will see you back in 2 weeks for labs and follow up.   Thank you for choosing [facility] at [facility] to provide your oncology and hematology care.  To afford each patient quality time with our provider, please arrive at least 15 minutes before your scheduled appointment time.   If you have a lab appointment with the Cancer Center please come in thru the Main Entrance and check in at the main information desk  You need to re-schedule your appointment should you arrive 10 or more minutes late.  We strive to give you quality time with our providers, and arriving late affects you and other patients whose appointments are after yours.  Also, if you no show three or more times for appointments you may be dismissed from the clinic at the providers discretion.     Again, thank you for choosing [facility].  Our hope is that these requests will decrease the amount of time that you wait before being seen by our physicians.       _____________________________________________________________  Should you have questions after your visit to [facility], please contact our office at [phone] between the hours of 8:00 a.m. and 4:30 p.m.  Voicemails left after 4:00 p.m. will not be returned until the following business day.  For prescription refill requests, have your pharmacy contact our office and allow 72 hours.    Cancer Center Support Programs:   > Cancer Support Group  2nd Tuesday of the month 1pm-2pm, Journey Room   

## 2020-08-23 ENCOUNTER — Encounter (HOSPITAL_COMMUNITY): Payer: Self-pay

## 2020-08-23 ENCOUNTER — Inpatient Hospital Stay (HOSPITAL_COMMUNITY): Payer: BC Managed Care – PPO

## 2020-08-23 ENCOUNTER — Other Ambulatory Visit: Payer: Self-pay

## 2020-08-23 VITALS — BP 125/70 | HR 64 | Temp 97.9°F | Resp 18

## 2020-08-23 DIAGNOSIS — C187 Malignant neoplasm of sigmoid colon: Secondary | ICD-10-CM | POA: Diagnosis not present

## 2020-08-23 DIAGNOSIS — Z95828 Presence of other vascular implants and grafts: Secondary | ICD-10-CM

## 2020-08-23 MED ORDER — PEGFILGRASTIM-CBQV 6 MG/0.6ML ~~LOC~~ SOSY
PREFILLED_SYRINGE | SUBCUTANEOUS | Status: AC
  Filled 2020-08-23: qty 0.6

## 2020-08-23 MED ORDER — SODIUM CHLORIDE 0.9% FLUSH
10.0000 mL | INTRAVENOUS | Status: DC | PRN
  Administered 2020-08-23: 10 mL

## 2020-08-23 MED ORDER — PEGFILGRASTIM-CBQV 6 MG/0.6ML ~~LOC~~ SOSY
6.0000 mg | PREFILLED_SYRINGE | Freq: Once | SUBCUTANEOUS | Status: AC
  Administered 2020-08-23: 6 mg via SUBCUTANEOUS

## 2020-08-23 MED ORDER — HEPARIN SOD (PORK) LOCK FLUSH 100 UNIT/ML IV SOLN
500.0000 [IU] | Freq: Once | INTRAVENOUS | Status: AC | PRN
  Administered 2020-08-23: 500 [IU]

## 2020-08-23 NOTE — Progress Notes (Signed)
Troy Smith presents to have home infusion pump d/c'd and for port-a-cath deaccess with flush.  Portacath located left chest wall accessed with  H 20 needle.  Good blood return present. Portacath flushed with NS and 500U/59ml Heparin, and needle removed intact.  Procedure tolerated well and without incident. Troy Smith presents today for injection per the provider's orders.  Udenyca administration without incident; injection site WNL; see MAR for injection details.  Patient tolerated procedure well and without incident.  No questions or complaints noted at this time. Pt remained stable throughout both procedures.  Discharged ambulatory in stable condition.

## 2020-08-23 NOTE — Patient Instructions (Signed)
[facility]  Discharge Instructions: Thank you for choosing [facility] to provide your oncology and hematology care.  If you have a lab appointment with the [facility], please come in thru the Main Entrance and check in at the main information desk.  Wear comfortable clothing and clothing appropriate for easy access to any Portacath or PICC line.   We strive to give you quality time with your provider. You may need to reschedule your appointment if you arrive late (15 or more minutes).  Arriving late affects you and other patients whose appointments are after yours.  Also, if you miss three or more appointments without notifying the office, you may be dismissed from the clinic at the provider's discretion.      For prescription refill requests, have your pharmacy contact our office and allow 72 hours for refills to be completed.    Today you received the following:  5FU ambulatory pump removal      To help prevent nausea and vomiting after your treatment, we encourage you to take your nausea medication as directed.  BELOW ARE SYMPTOMS THAT SHOULD BE REPORTED IMMEDIATELY: *FEVER GREATER THAN 100.4 F (38 C) OR HIGHER *CHILLS OR SWEATING *NAUSEA AND VOMITING THAT IS NOT CONTROLLED WITH YOUR NAUSEA MEDICATION *UNUSUAL SHORTNESS OF BREATH *UNUSUAL BRUISING OR BLEEDING *URINARY PROBLEMS (pain or burning when urinating, or frequent urination) *BOWEL PROBLEMS (unusual diarrhea, constipation, pain near the anus) TENDERNESS IN MOUTH AND THROAT WITH OR WITHOUT PRESENCE OF ULCERS (sore throat, sores in mouth, or a toothache) UNUSUAL RASH, SWELLING OR PAIN  UNUSUAL VAGINAL DISCHARGE OR ITCHING   Items with * indicate a potential emergency and should be followed up as soon as possible or go to the Emergency Department if any problems should occur.  Please show the CHEMOTHERAPY ALERT CARD or IMMUNOTHERAPY ALERT CARD at check-in to the Emergency Department and triage  nurse.  Should you have questions after your visit or need to cancel or reschedule your appointment, please contact [facility] [phone]  and follow the prompts.  Office hours are 8:00 a.m. to 4:30 p.m. Monday - Friday. Please note that voicemails left after 4:00 p.m. may not be returned until the following business day.  We are closed weekends and major holidays. You have access to a nurse at all times for urgent questions. Please call the main number to the clinic [phone] and follow the prompts.  For any non-urgent questions, you may also contact your provider using MyChart. We now offer e-Visits for anyone 64 and older to request care online for non-urgent symptoms. For details visit [email or web address].   Also download the MyChart app! Go to the app store, search "MyChart", open the app, select [place], and log in with your MyChart username and password.  Due to Covid, a mask is required upon entering the hospital/clinic. If you do not have a mask, one will be given to you upon arrival. For doctor visits, patients may have 1 support person aged 64 or older with them. For treatment visits, patients cannot have anyone with them due to current Covid guidelines and our immunocompromised population.

## 2020-09-03 NOTE — Progress Notes (Signed)
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 Focal narrowing and wall thickening of the sigmoid colon is noted in this area most likely due to inflammation, but neoplasm cannot be excluded, and sigmoidoscopy is recommended for further evaluation.   03/20/2016 Procedure   Colonoscopy by Dr. Laural Golden- Malignant partially obstructing tumor in the distal sigmoid colon. Biopsied. - Incomplete exam.    03/23/2016 Pathology Results   Colon, biopsy, sigmoid mass - ADENOCARCINOMA.    03/23/2016 Procedure   Low anterior resection with transanal anastomosis    03/26/2016 Pathology Results   Colon, segmental resection for tumor, sigmoid - INVASIVE WELL DIFFERENTIATED ADENOCARCINOMA, SPANNING 5 CM IN GREATEST DIMENSION. - TUMOR INVADES THROUGH MUSCULARIS PROPRIA TO INVOLVE SUBSEROSAL SOFT TISSUES. - UNDERLYING MARKED ACUTE AND CHRONIC INFLAMMATION WITH GIANT CELL REACTION AND ABSCESS FORMATION EXTENDING TO THE SEROSAL SURFACE AND INVOLVING THE MESENTERIC TISSUE. - MARGINS ARE NEGATIVE FOR TUMOR. - FOURTEEN BENIGN LYMPH NODES WHICH DEMONSTRATE VARIABLE INVOLVEMENT WITH ACUTE AND CHRONIC INFLAMMATION WITH ASSOCIATED GIANT CELLS WITHOUT TUMOR IDENTIFIED (0/14).    04/08/2016 Cancer Staging   Cancer Staging Adenocarcinoma of sigmoid colon [facility]) Staging form: Colon and Rectum, AJCC 8th Edition - Pathologic stage from 04/01/2016: Stage IIA (pT3, pN0, cM0) - Signed by  Baird Cancer, PA-C on 04/01/2016     07/09/2016 Imaging   CT abd/pelvis- No evidence of metastatic disease or other acute findings.  Bilateral nonobstructing renal calculi and mild bilateral renal parenchymal scarring.  Stable mildly enlarged prostate gland and findings of chronic bladder outlet obstruction.    08/16/2018 -  Chemotherapy    Patient is on Treatment Plan: COLORECTAL FOLFOXIRI          CANCER STAGING: Cancer Staging Adenocarcinoma of sigmoid colon [facility]) Staging form: Colon and Rectum, AJCC 8th Edition - Pathologic stage from 04/01/2016: Stage IIA (pT3, pN0, cM0) - Signed by Baird Cancer, PA-C on 04/01/2016  Virtual Visit via Video Note  I connected with Olean Ree on '@TODAY'$ @ at  9:00 AM EDT by a video enabled telemedicine application and verified that I am speaking with the correct person using two identifiers.  Location: Patient: clinic  Provider: home  Others present: Satira Anis, LPN   I discussed the limitations of evaluation and management by telemedicine and the availability of in person appointments. The patient expressed understanding and agreed to proceed.  INTERVAL HISTORY:  [name], a 65 y.o. male, returns for routine follow-up and consideration for next cycle of chemotherapy. Britt was last seen on 07/24/20.  Due for cycle #18 of FOLFOX today.   Overall, he tells me he has been feeling pretty well. He reports severely watery diarrhea occurring about 6-9 times daily. He is taking 2 Lomotil tablets 4 times daily which has not helped. He has lost 6 lbs since his last visit, and he is easily fatigued. He denies nausea, vomiting, bleeding issues, and leg swellings.    Overall, he does not feel ready for next cycle of chemo today.  REVIEW OF SYSTEMS:  Review of Systems  Constitutional:  Positive for appetite change (75%) and fatigue (75%).  HENT:   Negative for nosebleeds.   Cardiovascular:  Negative for leg  swelling.  Gastrointestinal:  Positive for diarrhea. Negative for blood in stool, nausea and vomiting.  Genitourinary:  Negative for hematuria.   Hematological:  Does not bruise/bleed easily.  All other systems reviewed and are negative.  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[place]) 03/23/2016   Carpal tunnel syndrome 05/02/2014   Bilateral   Cervical disc disorder with radiculopathy of cervical region 05/02/2014   Left C8   Diabetes mellitus without complication ([place])    Hypertension    Port-A-Cath in place 08/10/2018   Past Surgical History:  Procedure Laterality Date   BIOPSY  03/20/2016   Procedure: BIOPSY;  Surgeon: Rogene Houston, MD;  Location[facility];  Service: Endoscopy;;  colon   BOWEL RESECTION N/A 03/23/2016   Procedure: LOW ANTERIOR RESECTION;  Surgeon: Aviva Signs, MD;  Location: [facility] ORS;  Service: General;  Laterality: N/A;   COLON SURGERY  01/2019   Done at [place] 03/20/2016   Procedure: COLONOSCOPY;  Surgeon: Rogene Houston, MD;  Location: [facility];  Service: Endoscopy;  Laterality: N/A;  730   COLONOSCOPY N/A 07/10/2016   Procedure: COLONOSCOPY;  Surgeon: Rogene Houston, MD;  Location: [facility];  Service: Endoscopy;  Laterality: N/A;  1040   NO PAST SURGERIES     POLYPECTOMY  07/10/2016   Procedure: POLYPECTOMY;  Surgeon: Rogene Houston, MD;  Location: [facility];  Service: Endoscopy;;  colon   PORTACATH PLACEMENT Left 08/05/2018   Procedure: INSERTION PORT-A-CATH;  Surgeon: Aviva Signs, MD;  Location: [facility] ORS;  Service: General;  Laterality: Left;   TONSILLECTOMY      SOCIAL HISTORY:  Social History   Socioeconomic History   Marital status: Married    Spouse name: Not on file   Number of children: Not on file   Years of education: Not on file   Highest education level: Not on file  Occupational History   Not on file  Tobacco Use   Smoking status:  Never   Smokeless tobacco: Never  Vaping Use   Vaping Use: Never used  Substance and Sexual Activity   Alcohol use: Yes    Comment: rarely   Drug use: No   Sexual activity: Not on file  Other Topics Concern   Not on file  Social History Narrative   Not on file   Social Determinants of Health   Financial Resource Strain: Not on file  Food Insecurity: Not on file  Transportation Needs: No Transportation Needs   Lack of Transportation (Medical): No   Lack of Transportation (Non-Medical): No  Physical Activity: Inactive   Days of Exercise per Week: 0 days   Minutes of Exercise per Session: 0 min  Stress: Not on file  Social Connections: Not on file  Intimate Partner Violence: Not At Risk   Fear of Current or Ex-Partner: No   Emotionally Abused: No   Physically Abused: No   Sexually Abused: No    FAMILY HISTORY:  Family History  Problem Relation Age of Onset   COPD Mother    Melanoma Father     CURRENT MEDICATIONS:  Current Outpatient Medications  Medication Sig Dispense Refill   allopurinol (ZYLOPRIM) 300 MG tablet Take 1 tablet (300  mg total) by mouth daily. 30 tablet 6   amLODipine (NORVASC) 10 MG tablet Take 10 mg by mouth daily.     dexamethasone (DECADRON) 4 MG tablet Take 1 tablet (4 mg total) by mouth daily. Take in the morning with food 30 tablet 0   diphenoxylate-atropine (LOMOTIL) 2.5-0.025 MG tablet TAKE 2 AFTER FIRST ONSET OF DIARRHEA OR LOOSE STOOL, THEN 1 AFTER EACH LOOSE STOOL. 60 tablet 0   donepezil (ARICEPT) 10 MG tablet TAKE (1) TABLET BY MOUTH AT BEDTIME. 30 tablet 6   insulin glargine (LANTUS) 100 UNIT/ML injection Inject 30 Units into the skin at bedtime.      loperamide (IMODIUM A-D) 2 MG tablet Take 2 at onset of diarrhea, then 1 after each watery bowel movement. 100 tablet 1   megestrol (MEGACE) 400 MG/10ML suspension 2 teaspoons in the morning and 2 teaspoons in the evening 240 mL 0   memantine (NAMENDA) 10 MG tablet Take 1 tablet (10 mg total)  by mouth 2 (two) times daily. 60 tablet 5   metoprolol succinate (TOPROL-XL) 50 MG 24 hr tablet Take 50 mg by mouth daily. Take with or immediately following a meal.     prochlorperazine (COMPAZINE) 10 MG tablet Take 1 tablet (10 mg total) by mouth every 6 (six) hours as needed (Nausea or vomiting). 60 tablet 1   scopolamine (TRANSDERM-SCOP) 1 MG/3DAYS Place 1 patch (1.5 mg total) onto the skin every 3 (three) days. 10 patch 12   No current facility-administered medications for this visit.   Facility-Administered Medications Ordered in Other Visits  Medication Dose Route Frequency Provider Last Rate Last Admin   heparin lock flush 100 unit/mL  500 Units Intracatheter Once PRN Derek Jack, MD       sodium chloride flush (NS) 0.9 % injection 10 mL  10 mL Intracatheter PRN Derek Jack, MD        ALLERGIES:  Allergies  Allergen Reactions   Penicillin G Nausea And Vomiting   Performance status (ECOG): 1 - Symptomatic but completely ambulatory  There were no vitals filed for this visit. Wt Readings from Last 3 Encounters:  08/21/20 177 lb 14.4 oz (80.7 kg)  08/07/20 181 lb 12.8 oz (82.5 kg)  07/24/20 191 lb 6.4 oz (86.8 kg)    LABORATORY DATA:  I have reviewed the labs as listed.  CBC Latest Ref Rng & Units 08/21/2020 08/07/2020 07/24/2020  WBC 4.0 - 10.5 K/uL 5.7 5.8 6.6  Hemoglobin 13.0 - 17.0 g/dL 9.2(L) 10.0(L) 10.0(L)  Hematocrit 39.0 - 52.0 % 28.0(L) 29.6(L) 30.2(L)  Platelets 150 - 400 K/uL 112(L) 133(L) 123(L)   CMP Latest Ref Rng & Units 08/21/2020 08/07/2020 07/24/2020  Glucose 70 - 99 mg/dL 102(H) 112(H) 148(H)  BUN 8 - 23 mg/dL '14 12 12  '$ Creatinine 0.61 - 1.24 mg/dL 1.39(H) 1.48(H) 1.29(H)  Sodium 135 - 145 mmol/L 137 138 137  Potassium 3.5 - 5.1 mmol/L 3.5 3.0(L) 3.3(L)  Chloride 98 - 111 mmol/L 107 106 108  CO2 22 - 32 mmol/L '22 23 22  '$ Calcium 8.9 - 10.3 mg/dL 8.4(L) 8.7(L) 8.7(L)  Total Protein 6.5 - 8.1 g/dL 5.9(L) 6.2(L) 5.8(L)  Total Bilirubin 0.3  - 1.2 mg/dL 0.9 0.8 0.6  Alkaline Phos 38 - 126 U/L 113 144(H) 145(H)  AST 15 - 41 U/L 13(L) 14(L) 14(L)  ALT 0 - 44 U/L '13 12 12    '$ DIAGNOSTIC IMAGING:  I have independently reviewed the scans and discussed with the patient. No results found.  ASSESSMENT:  1.  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 -Laparoscopic partial hepatectomy, redo low anterior resection on 02/01/2019, pathology showing YPT3Y PN 0.  Positive circumferential/radial/mesenteric margin. -6 more cycles of FOLFOX completed on 05/31/2019. -Xeloda with radiation from 06/26/2019 through 08/03/2019. -CT CAP on 09/14/2019 shows postoperative changes in the rectosigmoid resection and reanastomosis with interval increase in posttreatment perirectal and presacral soft tissue thickening consistent with radiation changes.  No adenopathy.  Multiple stable small bilateral pulmonary nodules, likely benign.  No new evidence of metastatic disease.  Unchanged splenomegaly. - PET scan on 04/08/2020 done secondary to increase in CEA shows abnormal hypermetabolic soft tissue in the presacral region suspicious for recurrence.  Sclerosis involving sacrum.  Small left axillary lymph nodes favored to be reactive. - MRI pelvis on 04/26/2020 with enhancing lesion involving S1 segment of the sacrum concerning for metastatic disease.  Peripherally enhancing soft tissue nodule within the presacral soft tissues measuring 2.4 x 2.1 cm. - PET scan on 07/01/2020 showed hypermetabolic soft tissue lesion in the presacral space shows interval progression.  Destructive changes in the sacrum also mildly progressive.  Interval development of diffuse marrow uptake, likely from growth factor. - I talked to Dr. Oneita Kras from radiation oncology at [facility].  She has recommended 2 to 3 months of  chemotherapy followed by repeat pelvic MRI.   PLAN:  1.  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 - Last CEA improved to 28.  His LFTs today are normal.  Creatinine is elevated at 1.27.  Potassium was low at 2.9 and magnesium at 1.5. - We will hold off on his treatment today as he lost about 6 pounds in the last 2 weeks. - He has completed 5 cycles with improvement in CEA levels. - He is losing weight and having a lot of diarrhea.  I will hold off on cycle 6 at this time.  We will order MRI of the pelvis with and without contrast and CT of the chest and abdomen with contrast. - We will start him on potassium 20 mEq daily. - We will also contact Dr. Alfonso Ramus after the scans.   2.  Diarrhea: - He had severe diarrhea after last treatment.  He has 2-3 episodes of watery diarrhea after eating every time.  He will continue Lomotil 2 tablets 4 times daily.  I am holding his treatment which will likely improve his diarrhea.   3.  Hypomagnesemia: - His magnesium today is low at 1.5.  He will receive magnesium through IV.  He apparently stopped taking magnesium.  He will restart magnesium twice daily.   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   5.  Gout: - Continue allopurinol 300 mg daily.  No gout episodes.   6.  CKD: - Creatinine today is 1.27.  Continue aggressive hydration at home.   7.  Lower extremity swelling: - He is not requiring Lasix on a daily basis.  8.  Weight loss: - He started Megace 2 weeks back. - He lost 6 pounds in the last 2 weeks.  We are holding his treatment.   Orders placed this encounter:  No orders of the defined types were placed in this encounter.  I provided 30 minutes of non-face-to-face time during this encounter.  Derek Jack, MD Dene[facility] Lever  [place] [phone]   I, Thana Ates, am acting as a scribe for Dr. Derek Jack.  I, Derek Jack MD, have reviewed  the above documentation for accuracy and completeness, and I agree with the above.

## 2020-09-04 ENCOUNTER — Inpatient Hospital Stay (HOSPITAL_COMMUNITY): Payer: BC Managed Care – PPO | Admitting: Hematology

## 2020-09-04 ENCOUNTER — Other Ambulatory Visit: Payer: Self-pay

## 2020-09-04 ENCOUNTER — Encounter (HOSPITAL_COMMUNITY): Payer: Self-pay | Admitting: Hematology

## 2020-09-04 ENCOUNTER — Inpatient Hospital Stay (HOSPITAL_COMMUNITY): Payer: BC Managed Care – PPO

## 2020-09-04 VITALS — BP 118/73 | HR 60 | Temp 97.7°F | Resp 18

## 2020-09-04 DIAGNOSIS — C187 Malignant neoplasm of sigmoid colon: Secondary | ICD-10-CM

## 2020-09-04 DIAGNOSIS — R634 Abnormal weight loss: Secondary | ICD-10-CM

## 2020-09-04 LAB — CBC WITH DIFFERENTIAL/PLATELET
Abs Immature Granulocytes: 0.06 10*3/uL (ref 0.00–0.07)
Basophils Absolute: 0 10*3/uL (ref 0.0–0.1)
Basophils Relative: 0 %
Eosinophils Absolute: 0 10*3/uL (ref 0.0–0.5)
Eosinophils Relative: 1 %
HCT: 28.1 % — ABNORMAL LOW (ref 39.0–52.0)
Hemoglobin: 9.3 g/dL — ABNORMAL LOW (ref 13.0–17.0)
Immature Granulocytes: 1 %
Lymphocytes Relative: 11 %
Lymphs Abs: 0.7 10*3/uL (ref 0.7–4.0)
MCH: 31.2 pg (ref 26.0–34.0)
MCHC: 33.1 g/dL (ref 30.0–36.0)
MCV: 94.3 fL (ref 80.0–100.0)
Monocytes Absolute: 0.4 10*3/uL (ref 0.1–1.0)
Monocytes Relative: 6 %
Neutro Abs: 5.4 10*3/uL (ref 1.7–7.7)
Neutrophils Relative %: 81 %
Platelets: 110 10*3/uL — ABNORMAL LOW (ref 150–400)
RBC: 2.98 MIL/uL — ABNORMAL LOW (ref 4.22–5.81)
RDW: 18.9 % — ABNORMAL HIGH (ref 11.5–15.5)
Smear Review: DECREASED
WBC: 6.6 10*3/uL (ref 4.0–10.5)
nRBC: 0 % (ref 0.0–0.2)

## 2020-09-04 LAB — COMPREHENSIVE METABOLIC PANEL
ALT: 11 U/L (ref 0–44)
AST: 14 U/L — ABNORMAL LOW (ref 15–41)
Albumin: 3.2 g/dL — ABNORMAL LOW (ref 3.5–5.0)
Alkaline Phosphatase: 107 U/L (ref 38–126)
Anion gap: 6 (ref 5–15)
BUN: 11 mg/dL (ref 8–23)
CO2: 24 mmol/L (ref 22–32)
Calcium: 8.7 mg/dL — ABNORMAL LOW (ref 8.9–10.3)
Chloride: 106 mmol/L (ref 98–111)
Creatinine, Ser: 1.27 mg/dL — ABNORMAL HIGH (ref 0.61–1.24)
GFR, Estimated: >60 mL/min (ref >60)
Glucose, Bld: 92 mg/dL (ref 70–99)
Potassium: 2.9 mmol/L — ABNORMAL LOW (ref 3.5–5.1)
Sodium: 136 mmol/L (ref 135–145)
Total Bilirubin: 1.1 mg/dL (ref 0.3–1.2)
Total Protein: 5.7 g/dL — ABNORMAL LOW (ref 6.5–8.1)

## 2020-09-04 LAB — MAGNESIUM: Magnesium: 1.5 mg/dL — ABNORMAL LOW (ref 1.7–2.4)

## 2020-09-04 MED ORDER — POTASSIUM CHLORIDE IN NACL 20-0.9 MEQ/L-% IV SOLN
Freq: Once | INTRAVENOUS | Status: AC
Start: 2020-09-04 — End: 2020-09-04
  Filled 2020-09-04: qty 1000

## 2020-09-04 MED ORDER — MAGNESIUM SULFATE 2 GM/50ML IV SOLN
2.0000 g | Freq: Once | INTRAVENOUS | Status: AC
  Administered 2020-09-04: 2 g via INTRAVENOUS
  Filled 2020-09-04: qty 50

## 2020-09-04 MED ORDER — SODIUM CHLORIDE 0.9% FLUSH
10.0000 mL | Freq: Once | INTRAVENOUS | Status: AC
  Administered 2020-09-04: 10 mL via INTRAVENOUS

## 2020-09-04 MED ORDER — HEPARIN SOD (PORK) LOCK FLUSH 100 UNIT/ML IV SOLN
500.0000 [IU] | Freq: Once | INTRAVENOUS | Status: AC
Start: 2020-09-04 — End: 2020-09-04
  Administered 2020-09-04: 500 [IU] via INTRAVENOUS

## 2020-09-04 MED ORDER — POTASSIUM CHLORIDE CRYS ER 20 MEQ PO TBCR: 20.0000 meq | EXTENDED_RELEASE_TABLET | Freq: Every day | ORAL | 3 refills | Status: AC

## 2020-09-04 MED ORDER — POTASSIUM CHLORIDE CRYS ER 20 MEQ PO TBCR
40.0000 meq | EXTENDED_RELEASE_TABLET | Freq: Once | ORAL | Status: AC
  Administered 2020-09-04: 40 meq via ORAL
  Filled 2020-09-04: qty 2

## 2020-09-04 NOTE — Patient Instructions (Signed)
[facility]  Discharge Instructions: Thank you for choosing [facility] to provide your oncology and hematology care.  If you have a lab appointment with the [facility], please come in thru the Main Entrance and check in at the main information desk.  Wear comfortable clothing and clothing appropriate for easy access to any Portacath or PICC line.   We strive to give you quality time with your provider. You may need to reschedule your appointment if you arrive late (15 or more minutes).  Arriving late affects you and other patients whose appointments are after yours.  Also, if you miss three or more appointments without notifying the office, you may be dismissed from the clinic at the provider's discretion.      For prescription refill requests, have your pharmacy contact our office and allow 72 hours for refills to be completed.    Today you received the following: Hydration fluids. Return as scheduled.   To help prevent nausea and vomiting after your treatment, we encourage you to take your nausea medication as directed.  BELOW ARE SYMPTOMS THAT SHOULD BE REPORTED IMMEDIATELY: *FEVER GREATER THAN 100.4 F (38 C) OR HIGHER *CHILLS OR SWEATING *NAUSEA AND VOMITING THAT IS NOT CONTROLLED WITH YOUR NAUSEA MEDICATION *UNUSUAL SHORTNESS OF BREATH *UNUSUAL BRUISING OR BLEEDING *URINARY PROBLEMS (pain or burning when urinating, or frequent urination) *BOWEL PROBLEMS (unusual diarrhea, constipation, pain near the anus) TENDERNESS IN MOUTH AND THROAT WITH OR WITHOUT PRESENCE OF ULCERS (sore throat, sores in mouth, or a toothache) UNUSUAL RASH, SWELLING OR PAIN  UNUSUAL VAGINAL DISCHARGE OR ITCHING   Items with * indicate a potential emergency and should be followed up as soon as possible or go to the Emergency Department if any problems should occur.  Please show the CHEMOTHERAPY ALERT CARD or IMMUNOTHERAPY ALERT CARD at check-in to the Emergency Department and  triage nurse.  Should you have questions after your visit or need to cancel or reschedule your appointment, please contact [facility] [phone]  and follow the prompts.  Office hours are 8:00 a.m. to 4:30 p.m. Monday - Friday. Please note that voicemails left after 4:00 p.m. may not be returned until the following business day.  We are closed weekends and major holidays. You have access to a nurse at all times for urgent questions. Please call the main number to the clinic [phone] and follow the prompts.  For any non-urgent questions, you may also contact your provider using MyChart. We now offer e-Visits for anyone 5 and older to request care online for non-urgent symptoms. For details visit [email or web address].   Also download the MyChart app! Go to the app store, search "MyChart", open the app, select [facility], and log in with your MyChart username and password.  Due to Covid, a mask is required upon entering the hospital/clinic. If you do not have a mask, one will be given to you upon arrival. For doctor visits, patients may have 1 support person aged 94 or older with them. For treatment visits, patients cannot have anyone with them due to current Covid guidelines and our immunocompromised population.

## 2020-09-04 NOTE — Patient Instructions (Addendum)
[facility] at [facility] Discharge Instructions  You were seen today by Dr. Delton Coombes. He went over your recent results. You will be scheduled for an MRI scan of your pelvis and a CT scan of your chest and abdomen prior to your next appointment. Dr. Delton Coombes will see you back in after your scans for labs and follow up.   Thank you for choosing [facility] at [facility] to provide your oncology and hematology care.  To afford each patient quality time with our provider, please arrive at least 15 minutes before your scheduled appointment time.   If you have a lab appointment with the [facility] please come in thru the Main Entrance and check in at the main information desk  You need to re-schedule your appointment should you arrive 10 or more minutes late.  We strive to give you quality time with our providers, and arriving late affects you and other patients whose appointments are after yours.  Also, if you no show three or more times for appointments you may be dismissed from the clinic at the providers discretion.     Again, thank you for choosing [facility].  Our hope is that these requests will decrease the amount of time that you wait before being seen by our physicians.       _____________________________________________________________  Should you have questions after your visit to [facility], please contact our office at [phone] between the hours of 8:00 a.m. and 4:30 p.m.  Voicemails left after 4:00 p.m. will not be returned until the following business day.  For prescription refill requests, have your pharmacy contact our office and allow 72 hours.    Cancer Center Support Programs:   > Cancer Support Group  2nd Tuesday of the month 1pm-2pm, Journey Room

## 2020-09-04 NOTE — Progress Notes (Signed)
Patient presents today for treatment port flushed and labs drawn. Potassium 2.9 & Magnesium 1.5. Patient assessed by Dr. Delton Coombes and received orders for 2g IV Magnesium, House fluids and 40 mEq PO potassium. No treatment today per Dr. Delton Coombes.   Patient tolerated Hydration fluids with no complaints voiced. Side effects with management reviewed understanding verbalized. Port site clean and dry with no bruising or swelling noted at site. Good blood return noted before and after administration of fluids. Band aid applied. Patient left in satisfactory condition with VSS and no s/s of distress noted.

## 2020-09-04 NOTE — Progress Notes (Signed)
Patient has been assessed, vital signs and labs have been reviewed by Dr. Delton Coombes. ANC, Creatinine, LFTs, and Platelets are within treatment parameters per Dr. Delton Coombes. Potassium is 2.9 today Dr. Raliegh Ip is starting him on potassium once daily. Magnesium is 1.5 today  he will start magnesium twice daily. Dr. Delton Coombes is going to hold treatment today due to diarrhea and weight loss. Primary RN and pharmacy aware.

## 2020-09-05 LAB — CEA: CEA: 21.9 ng/mL — ABNORMAL HIGH (ref 0.0–4.7)

## 2020-09-06 ENCOUNTER — Encounter (HOSPITAL_COMMUNITY): Payer: BC Managed Care – PPO

## 2020-09-16 ENCOUNTER — Ambulatory Visit (HOSPITAL_COMMUNITY)
Admission: RE | Admit: 2020-09-16 | Discharge: 2020-09-16 | Disposition: A | Discharge status: Home / Self Care | Payer: BC Managed Care – PPO | Source: Ambulatory Visit | Attending: Hematology | Admitting: Hematology

## 2020-09-16 ENCOUNTER — Other Ambulatory Visit: Payer: Self-pay

## 2020-09-16 ENCOUNTER — Encounter (HOSPITAL_COMMUNITY): Payer: Self-pay

## 2020-09-16 DIAGNOSIS — C187 Malignant neoplasm of sigmoid colon: Secondary | ICD-10-CM

## 2020-09-16 MED ORDER — GADOBUTROL 1 MMOL/ML IV SOLN
8.0000 mL | Freq: Once | INTRAVENOUS | Status: AC | PRN
  Administered 2020-09-16: 8 mL via INTRAVENOUS

## 2020-09-16 MED ORDER — IOHEXOL 350 MG/ML SOLN
80.0000 mL | Freq: Once | INTRAVENOUS | Status: AC | PRN
  Administered 2020-09-16: 80 mL via INTRAVENOUS

## 2020-09-17 NOTE — Progress Notes (Signed)
Troy Smith,  [id number]   CLINIC:  Medical Oncology/Hematology  PCP:  Troy Smith, Troy Smith / Troy Smith [id number] [phone]   REASON FOR VISIT:  Follow-up for metastatic colon Smith with local recurrence  PRIOR THERAPY: Chemotherapy followed by chemoradiation  NGS Results: not done  CURRENT THERAPY: FOLFOX every 2 weeks  BRIEF ONCOLOGIC HISTORY:  Oncology History  Adenocarcinoma of sigmoid colon (Troy Smith)  11/20/2015 Imaging   CT abd/pelvis- Focal sigmoid diverticulitis is noted without abscess formation. Focal narrowing and wall thickening of the sigmoid colon is noted in this area most likely due to inflammation, but neoplasm cannot be excluded, and sigmoidoscopy is recommended for further evaluation.   03/20/2016 Procedure   Colonoscopy by Dr. Laural Smith- Malignant partially obstructing tumor in the distal sigmoid colon. Biopsied. - Incomplete exam.    03/23/2016 Pathology Results   Colon, biopsy, sigmoid mass - ADENOCARCINOMA.    03/23/2016 Procedure   Low anterior resection with transanal anastomosis    03/26/2016 Pathology Results   Colon, segmental resection for tumor, sigmoid - INVASIVE WELL DIFFERENTIATED ADENOCARCINOMA, SPANNING 5 CM IN GREATEST DIMENSION. - TUMOR INVADES THROUGH MUSCULARIS PROPRIA TO INVOLVE SUBSEROSAL SOFT TISSUES. - UNDERLYING MARKED ACUTE AND CHRONIC INFLAMMATION WITH GIANT CELL REACTION AND ABSCESS FORMATION EXTENDING TO THE SEROSAL SURFACE AND INVOLVING THE MESENTERIC TISSUE. - MARGINS ARE NEGATIVE FOR TUMOR. - FOURTEEN BENIGN LYMPH NODES WHICH DEMONSTRATE VARIABLE INVOLVEMENT WITH ACUTE AND CHRONIC INFLAMMATION WITH ASSOCIATED GIANT CELLS WITHOUT TUMOR IDENTIFIED (0/14).    04/08/2016 Smith Staging   Smith Staging Adenocarcinoma of sigmoid colon Troy Smith) Staging form: Colon and Rectum, AJCC 8th Edition - Pathologic stage from 04/01/2016: Stage IIA (pT3, pN0, cM0) - Signed by  Troy Smith on 04/01/2016     07/09/2016 Imaging   CT abd/pelvis- No evidence of metastatic disease or other acute findings.  Bilateral nonobstructing renal calculi and mild bilateral renal parenchymal scarring.  Stable mildly enlarged prostate gland and findings of chronic bladder outlet obstruction.    08/16/2018 -  Chemotherapy    Patient is on Treatment Plan: COLORECTAL FOLFOXIRI          Smith STAGING: Smith Staging Adenocarcinoma of sigmoid colon [facility]) Staging form: Colon and Rectum, AJCC 8th Edition - Pathologic stage from 04/01/2016: Stage IIA (pT3, pN0, cM0) - Signed by Troy Smith on 04/01/2016   INTERVAL HISTORY:  Troy Smith, a 65 y.o. male, returns for routine follow-up. Troy Smith was last seen on 09/04/20.  Overall, he tells me he has been feeling very well. His appetite and energy levels are much improved. He denies tingling and numbness in his hands and feet and leg swellings. He has not needed Megace recently.   REVIEW OF SYSTEMS:  Review of Systems  Constitutional:  Negative for appetite change (75%) and fatigue (75%).  Cardiovascular:  Negative for leg swelling.  Gastrointestinal:  Diarrhea: improved.  Neurological:  Negative for numbness.  All other systems reviewed and are negative.  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[place]) 03/23/2016   Carpal tunnel syndrome 05/02/2014   Bilateral   Cervical disc disorder with radiculopathy of cervical region 05/02/2014   Left C8   Diabetes mellitus without complication ([place])    Hypertension    Port-A-Cath in place 08/10/2018   Past Surgical History:  Procedure Laterality Date   BIOPSY  03/20/2016   Procedure: BIOPSY;  Surgeon: Troy Smith  Troy Golden, MD;  Location: [facility];  Service: Endoscopy;;  colon   BOWEL RESECTION N/A 03/23/2016   Procedure: LOW ANTERIOR RESECTION;  Surgeon: Troy Signs, MD;  Location: [facility] ORS;   Service: General;  Laterality: N/A;   COLON SURGERY  01/2019   Done at [place] 03/20/2016   Procedure: COLONOSCOPY;  Surgeon: Rogene Houston, MD;  Location: [facility];  Service: Endoscopy;  Laterality: N/A;  730   COLONOSCOPY N/A 07/10/2016   Procedure: COLONOSCOPY;  Surgeon: Rogene Houston, MD;  Location: [facility];  Service: Endoscopy;  Laterality: N/A;  1040   NO PAST SURGERIES     POLYPECTOMY  07/10/2016   Procedure: POLYPECTOMY;  Surgeon: Rogene Houston, MD;  Location: [facility];  Service: Endoscopy;;  colon   PORTACATH PLACEMENT Left 08/05/2018   Procedure: INSERTION PORT-A-CATH;  Surgeon: Troy Signs, MD;  Location: [facility] OR[facility];  Service: General;  Laterality: Left;   TONSILLECTOMY      SOCIAL HISTORY:  Social History   Socioeconomic History   Marital status: Married    Spouse name: Not on file   Number of children: Not on file   Years of education: Not on file   Highest education level: Not on file  Occupational History   Not on file  Tobacco Use   Smoking status: Never   Smokeless tobacco: Never  Vaping Use   Vaping Use: Never used  Substance and Sexual Activity   Alcohol use: Yes    Comment: rarely   Drug use: No   Sexual activity: Not on file  Other Topics Concern   Not on file  Social History Narrative   Not on file   Social Determinants of Health   Financial Resource Strain: Not on file  Food Insecurity: Not on file  Transportation Needs: No Transportation Needs   Lack of Transportation (Medical): No   Lack of Transportation (Non-Medical): No  Physical Activity: Inactive   Days of Exercise per Week: 0 days   Minutes of Exercise per Session: 0 min  Stress: Not on file  Social Connections: Not on file  Intimate Partner Violence: Not At Risk   Fear of Current or Ex-Partner: No   Emotionally Abused: No   Physically Abused: No   Sexually Abused: No    FAMILY HISTORY:  Family History  Problem Relation Age of Onset    COPD Mother    Melanoma Father     CURRENT MEDICATIONS:  Current Outpatient Medications  Medication Sig Dispense Refill   allopurinol (ZYLOPRIM) 300 MG tablet Take 1 tablet (300 mg total) by mouth daily. 30 tablet 6   amLODipine (NORVASC) 10 MG tablet Take 10 mg by mouth daily.     dexamethasone (DECADRON) 4 MG tablet Take 1 tablet (4 mg total) by mouth daily. Take in the morning with food 30 tablet 0   diphenoxylate-atropine (LOMOTIL) 2.5-0.025 MG tablet TAKE 2 AFTER FIRST ONSET OF DIARRHEA OR LOOSE STOOL, THEN 1 AFTER EACH LOOSE STOOL. 60 tablet 0   donepezil (ARICEPT) 10 MG tablet TAKE (1) TABLET BY MOUTH AT BEDTIME. 30 tablet 6   insulin glargine (LANTUS) 100 UNIT/ML injection Inject 30 Units into the skin at bedtime.      megestrol (MEGACE) 400 MG/10ML suspension 2 teaspoons in the morning and 2 teaspoons in the evening 240 mL 0   memantine (NAMENDA) 10 MG tablet Take 1 tablet (10 mg total) by mouth 2 (two) times daily. 60 tablet  5   metoprolol succinate (TOPROL-XL) 50 MG 24 hr tablet Take 50 mg by mouth daily. Take with or immediately following a meal.     potassium chloride SA (KLOR-CON) 20 MEQ tablet Take 1 tablet (20 mEq total) by mouth daily. 30 tablet 3   prochlorperazine (COMPAZINE) 10 MG tablet Take 1 tablet (10 mg total) by mouth every 6 (six) hours as needed (Nausea or vomiting). 60 tablet 1   scopolamine (TRANSDERM-SCOP) 1 MG/3DAYS Place 1 patch (1.5 mg total) onto the skin every 3 (three) days. 10 patch 12   loperamide (IMODIUM A-D) 2 MG tablet Take 2 at onset of diarrhea, then 1 after each watery bowel movement. 100 tablet 1   No current facility-administered medications for this visit.   Facility-Administered Medications Ordered in Other Visits  Medication Dose Route Frequency Provider Last Rate Last Admin   heparin lock flush 100 unit/mL  500 Units Intracatheter Once PRN Derek Jack, MD       sodium chloride flush (NS) 0.9 % injection 10 mL  10 mL Intracatheter  PRN Derek Jack, MD        ALLERGIES:  Allergies  Allergen Reactions   Penicillin G Nausea And Vomiting    PHYSICAL EXAM:  Performance status (ECOG): 1 - Symptomatic but completely ambulatory  Vitals:   09/18/20 1032  BP: (!) 145/86  Pulse: 74  Resp: 18  Temp: 98.2 F (36.8 C)  SpO2: 100%   Wt Readings from Last 3 Encounters:  09/04/20 171 lb 12.8 oz (77.9 kg)  08/21/20 177 lb 14.4 oz (80.7 kg)  08/07/20 181 lb 12.8 oz (82.5 kg)   Physical Exam Vitals reviewed.  Constitutional:      Appearance: Normal appearance.  Cardiovascular:     Rate and Rhythm: Normal rate and regular rhythm.     Pulses: Normal pulses.     Heart sounds: Normal heart sounds.  Pulmonary:     Effort: Pulmonary effort is normal.     Breath sounds: Normal breath sounds.  Neurological:     General: No focal deficit present.     Mental Status: He is alert and oriented to person, place, and time.  Psychiatric:        Mood and Affect: Mood normal.        Behavior: Behavior normal.    LABORATORY DATA:  I have reviewed the labs as listed.  CBC Latest Ref Rng & Units 09/04/2020 08/21/2020 08/07/2020  WBC 4.0 - 10.5 K/uL 6.6 5.7 5.8  Hemoglobin 13.0 - 17.0 g/dL 9.3(L) 9.2(L) 10.0(L)  Hematocrit 39.0 - 52.0 % 28.1(L) 28.0(L) 29.6(L)  Platelets 150 - 400 K/uL 110(L) 112(L) 133(L)   CMP Latest Ref Rng & Units 09/04/2020 08/21/2020 08/07/2020  Glucose 70 - 99 mg/dL 92 102(H) 112(H)  BUN 8 - 23 mg/dL '11 14 12  '$ Creatinine 0.61 - 1.24 mg/dL 1.27(H) 1.39(H) 1.48(H)  Sodium 135 - 145 mmol/L 136 137 138  Potassium 3.5 - 5.1 mmol/L 2.9(L) 3.5 3.0(L)  Chloride 98 - 111 mmol/L 106 107 106  CO2 22 - 32 mmol/L '24 22 23  '$ Calcium 8.9 - 10.3 mg/dL 8.7(L) 8.4(L) 8.7(L)  Total Protein 6.5 - 8.1 g/dL 5.7(L) 5.9(L) 6.2(L)  Total Bilirubin 0.3 - 1.2 mg/dL 1.1 0.9 0.8  Alkaline Phos 38 - 126 U/L 107 113 144(H)  AST 15 - 41 U/L 14(L) 13(L) 14(L)  ALT 0 - 44 U/L '11 13 12    '$ DIAGNOSTIC IMAGING:  I have  independently reviewed the scans and  discussed with the patient. CT Chest W Contrast  Addendum Date: 09/17/2020   ADDENDUM REPORT: 09/17/2020 13:12 ADDENDUM: Addendum is made to correct an error in the original report. Comparison was made to prior PET-CT dated 07/01/2020 as well as diagnostic CT chest abdomen pelvis dated 03/21/2020, original report incorrectly stated no comparison. Electronically Signed   By: Eddie Candle M.D.   On: 09/17/2020 13:12   Result Date: 09/17/2020 CLINICAL DATA:  Rectal Smith restaging EXAM: CT CHEST AND ABDOMEN WITH CONTRAST TECHNIQUE: Multidetector CT imaging of the chest and abdomen was performed following the standard protocol during bolus administration of intravenous contrast. CONTRAST:  61m OMNIPAQUE IOHEXOL 350 MG/ML SOLN, additional oral enteric contrast COMPARISON:  None. FINDINGS: CT CHEST FINDINGS Cardiovascular: Left chest port catheter. Normal heart size. Left coronary artery calcifications. No pericardial effusion. Mediastinum/Nodes: No enlarged mediastinal, hilar, or axillary lymph nodes. Thyroid gland, trachea, and esophagus demonstrate no significant findings. Lungs/Pleura: Unchanged 4 mm nodule of the peripheral right lower lobe (series 7, image 99). Unchanged 3 mm nodule of the peripheral left lower lobe (series 7, image 105). Unchanged 3 mm fissural nodule of the anterior right middle lobe (series 7, image 89). No pleural effusion or pneumothorax. Musculoskeletal: No chest wall mass or suspicious bone lesions identified. CT ABDOMEN FINDINGS Hepatobiliary: Unchanged postoperative findings of partial right hepatectomy medial to the gallbladder fossa (series 5, image 53). Status post cholecystectomy. No biliary dilatation. Pancreas: Unremarkable. No pancreatic ductal dilatation or surrounding inflammatory changes. Spleen: No splenic injury or perisplenic hematoma. Adrenals/Urinary Tract: Adrenal glands are unremarkable. Small bilateral nonobstructive renal calculi.  No ureteral calculi or hydronephrosis. Bladder is unremarkable. Stomach/Bowel: Stomach is within normal limits. Status post appendectomy. No evidence of bowel wall thickening, distention, or inflammatory changes. Vascular/Lymphatic: Aortic atherosclerosis. No enlarged abdominal or pelvic lymph nodes. Other: No abdominal wall hernia or abnormality. No abdominopelvic ascites. Musculoskeletal: No acute or significant osseous findings. IMPRESSION: 1. Unchanged postoperative findings of partial hepatectomy. 2. Stable small bilateral pulmonary nodules, measuring 4 mm and smaller, almost certainly benign, incidental sequelae of prior infection or inflammation. Attention on follow-up. 3. No evidence of lymphadenopathy or metastatic disease in the chest or abdomen. 4. Nonobstructive bilateral nephrolithiasis. 5. Coronary artery disease. Aortic Atherosclerosis (ICD10-I70.0). Electronically Signed: By: AEddie CandleM.D. On: 09/17/2020 12:53   CT Abdomen W Contrast  Addendum Date: 09/17/2020   ADDENDUM REPORT: 09/17/2020 13:12 ADDENDUM: Addendum is made to correct an error in the original report. Comparison was made to prior PET-CT dated 07/01/2020 as well as diagnostic CT chest abdomen pelvis dated 03/21/2020, original report incorrectly stated no comparison. Electronically Signed   By: AEddie CandleM.D.   On: 09/17/2020 13:12   Result Date: 09/17/2020 CLINICAL DATA:  Rectal Smith restaging EXAM: CT CHEST AND ABDOMEN WITH CONTRAST TECHNIQUE: Multidetector CT imaging of the chest and abdomen was performed following the standard protocol during bolus administration of intravenous contrast. CONTRAST:  888mOMNIPAQUE IOHEXOL 350 MG/ML SOLN, additional oral enteric contrast COMPARISON:  None. FINDINGS: CT CHEST FINDINGS Cardiovascular: Left chest port catheter. Normal heart size. Left coronary artery calcifications. No pericardial effusion. Mediastinum/Nodes: No enlarged mediastinal, hilar, or axillary lymph nodes. Thyroid  gland, trachea, and esophagus demonstrate no significant findings. Lungs/Pleura: Unchanged 4 mm nodule of the peripheral right lower lobe (series 7, image 99). Unchanged 3 mm nodule of the peripheral left lower lobe (series 7, image 105). Unchanged 3 mm fissural nodule of the anterior right middle lobe (series 7, image 89). No pleural effusion or pneumothorax.  Musculoskeletal: No chest wall mass or suspicious bone lesions identified. CT ABDOMEN FINDINGS Hepatobiliary: Unchanged postoperative findings of partial right hepatectomy medial to the gallbladder fossa (series 5, image 53). Status post cholecystectomy. No biliary dilatation. Pancreas: Unremarkable. No pancreatic ductal dilatation or surrounding inflammatory changes. Spleen: No splenic injury or perisplenic hematoma. Adrenals/Urinary Tract: Adrenal glands are unremarkable. Small bilateral nonobstructive renal calculi. No ureteral calculi or hydronephrosis. Bladder is unremarkable. Stomach/Bowel: Stomach is within normal limits. Status post appendectomy. No evidence of bowel wall thickening, distention, or inflammatory changes. Vascular/Lymphatic: Aortic atherosclerosis. No enlarged abdominal or pelvic lymph nodes. Other: No abdominal wall hernia or abnormality. No abdominopelvic ascites. Musculoskeletal: No acute or significant osseous findings. IMPRESSION: 1. Unchanged postoperative findings of partial hepatectomy. 2. Stable small bilateral pulmonary nodules, measuring 4 mm and smaller, almost certainly benign, incidental sequelae of prior infection or inflammation. Attention on follow-up. 3. No evidence of lymphadenopathy or metastatic disease in the chest or abdomen. 4. Nonobstructive bilateral nephrolithiasis. 5. Coronary artery disease. Aortic Atherosclerosis (ICD10-I70.0). Electronically Signed: By: Eddie Candle M.D. On: 09/17/2020 12:53   MR PELVIS W WO CONTRAST  Result Date: 09/17/2020 CLINICAL DATA:  Evaluate treatment response in the setting of  colorectal Smith. Rectal Smith, status post surgery and radiation therapy. EXAM: MRI PELVIS WITHOUT AND WITH CONTRAST TECHNIQUE: Multiplanar multisequence MR imaging of the pelvis was performed both before and after administration of intravenous contrast. CONTRAST:  12m GADAVIST GADOBUTROL 1 MMOL/ML IV SOLN COMPARISON:  Multiple priors, including the pelvic MRI of 04/26/2020 and the PET of 07/01/2020. FINDINGS: Urinary Tract: Impression by the enlarged prostate into the urinary bladder. Bowel: Normal pelvic small bowel. No bowel obstruction. The rectum appears mildly thick walled with mild mucosal hyperenhancement, including on 42/20. No dominant residual or recurrent mass. Vascular/Lymphatic: No pelvic aneurysm or sidewall adenopathy. Reproductive:  Median lobe impression into the urinary bladder. Other: The pre sacral lesion with possible cortical involvement of the anterior cortex at the S2 level measures 3.0 x 2.3 cm on 27/20 versus 2.4 x 2.1 cm on the prior MRI. Slightly eccentric left. Musculoskeletal: The S1 infiltrative sacral lesion is progressive since the prior MRI. Example on the order of 9.9 x 5.8 cm on 16/20, extending extensively into the right hemi sacrum. On the prior exam, this was primarily left-sided and measured on the order of 5.8 x 5.2 cm (when remeasured). Relatively linear T2 hyperintensity and hyperenhancement within the left acetabulum including on 17/15 and 25/21 are new. Redemonstration of nonspecific relatively diffuse heterogeneous marrow signal/enhancement. IMPRESSION: 1. Since the MRI of 04/26/2020, progression of sacral and presacral metastasis as detailed above. 2. Linear signal abnormality in the left acetabulum, suspicious for insufficiency fracture. 3. Mild diffuse rectal wall thickening, possibly related to prior radiation. No dominant well-defined focal mass. 4. Similar diffuse heterogeneous marrow enhancement is nonspecific but could represent diffuse metastasis. 5.  Prostatomegaly Electronically Signed   By: KAbigail MiyamotoM.D.   On: 09/17/2020 13:30     ASSESSMENT:  1.  Metastatic sigmoid colon Smith to the liver: -6 cycles of FOLFOX with bevacizumab completed on 11/30/2018. 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 -Laparoscopic partial hepatectomy, redo low anterior resection on 02/01/2019, pathology showing YPT3Y PN 0.  Positive circumferential/radial/mesenteric margin. -6 more cycles of FOLFOX completed on 05/31/2019. -Xeloda with radiation from 06/26/2019 through 08/03/2019. -CT CAP on 09/14/2019 shows postoperative changes in the rectosigmoid resection and reanastomosis with interval increase in posttreatment perirectal and presacral  soft tissue thickening consistent with radiation changes.  No adenopathy.  Multiple stable small bilateral pulmonary nodules, likely benign.  No new evidence of metastatic disease.  Unchanged splenomegaly. - PET scan on 04/08/2020 done secondary to increase in CEA shows abnormal hypermetabolic soft tissue in the presacral region suspicious for recurrence.  Sclerosis involving sacrum.  Small left axillary lymph nodes favored to be reactive. - MRI pelvis on 04/26/2020 with enhancing lesion involving S1 segment of the sacrum concerning for metastatic disease.  Peripherally enhancing soft tissue nodule within the presacral soft tissues measuring 2.4 x 2.1 cm. - PET scan on 07/01/2020 showed hypermetabolic soft tissue lesion in the presacral space shows interval progression.  Destructive changes in the sacrum also mildly progressive.  Interval development of diffuse marrow uptake, likely from growth factor. - I talked to Dr. Oneita Kras from radiation oncology at [facility].  She has recommended 2 to 3 months of chemotherapy followed by repeat pelvic MRI.   PLAN:  1.  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 -  CEA has decreased from 47.6-21.9. - Reviewed CT chest and abdomen from 09/16/2020 which showed stable small bilateral lung nodules measuring 4 mm and smaller, most likely benign.  No evidence of metastatic disease in the chest or abdomen. - Reviewed MRI pelvis with and without contrast from 09/16/2024.  Although it is reported that there is progression of the presacral mass, this MRI was compared to MRI from 04/26/2020.  He started treatment towards the end of May.  The presacral mass has slightly improved compared to the PET scan which was done around the time of starting chemotherapy in May.  However the sacral lesion has increased in size measuring 9.9 x 5.8 cm. - He is tolerating chemotherapy poorly due to diarrhea and weight loss. - I have talked to Dr. Oneita Kras at [facility].  She will see the patient soon and talk to him about radiation therapy.  We will also plan to give Xeloda at radiation sensitizing doses twice daily during the course of radiation. - I have called and talked to patient's wife and informed plan.  We will send prescription for Xeloda. - We will see him 1 week after the start of treatment with labs.   2.  Diarrhea: - Diarrhea has improved as we discontinued his chemotherapy.  He has not required Lomotil more than 2-3 times per week.   3.  Hypomagnesemia: - Continue magnesium twice daily.   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   5.  Gout: - Continue allopurinol 300 mg daily.  No acute gout episodes.   6.  CKD: - Creatinine is stable around 1.27.   7.  Lower extremity swelling: - He is not requiring Lasix on daily basis.  8.  Weight loss: - He is not taking Megace.  However his appetite has improved and he gained 8 pounds.   Orders placed this encounter:  No orders of the defined types were placed in this encounter.    Derek Jack, MD [place] [phone]   I, Thana Ates, am acting as a scribe for Dr. Derek Jack.  I, Derek Jack MD, have reviewed the above documentation for accuracy and completeness, and I agree with the above.

## 2020-09-18 ENCOUNTER — Inpatient Hospital Stay (HOSPITAL_COMMUNITY): Payer: BC Managed Care – PPO | Attending: Hematology | Admitting: Hematology

## 2020-09-18 ENCOUNTER — Other Ambulatory Visit: Payer: Self-pay

## 2020-09-18 ENCOUNTER — Encounter (HOSPITAL_COMMUNITY): Payer: Self-pay | Admitting: Hematology

## 2020-09-18 ENCOUNTER — Other Ambulatory Visit (HOSPITAL_COMMUNITY): Payer: Self-pay | Admitting: *Deleted

## 2020-09-18 VITALS — BP 145/86 | HR 74 | Temp 98.2°F | Resp 18

## 2020-09-18 DIAGNOSIS — Z95828 Presence of other vascular implants and grafts: Secondary | ICD-10-CM

## 2020-09-18 DIAGNOSIS — C187 Malignant neoplasm of sigmoid colon: Secondary | ICD-10-CM | POA: Diagnosis present

## 2020-09-18 DIAGNOSIS — C787 Secondary malignant neoplasm of liver and intrahepatic bile duct: Secondary | ICD-10-CM | POA: Insufficient documentation

## 2020-09-18 DIAGNOSIS — C7951 Secondary malignant neoplasm of bone: Secondary | ICD-10-CM | POA: Diagnosis not present

## 2020-09-18 MED ORDER — LOPERAMIDE HCL 2 MG PO TABS: ORAL_TABLET | ORAL | 1 refills | Status: DC

## 2020-09-18 MED ORDER — CAPECITABINE 500 MG PO TABS
1500.0000 mg | ORAL_TABLET | Freq: Two times a day (BID) | ORAL | 1 refills | Status: DC
  Filled 2020-09-18: qty 60, 10d supply, fill #0

## 2020-09-18 NOTE — Patient Instructions (Addendum)
[place] at [facility] Discharge Instructions  You were seen today by Dr. Delton Coombes. He went over your recent results and scans. Dr. Delton Coombes will see you back in 1 week for labs and follow up.   Thank you for choosing [place] at [facility] to provide your oncology and hematology care.  To afford each patient quality time with our provider, please arrive at least 15 minutes before your scheduled appointment time.   If you have a lab appointment with the [facility] please come in thru the Main Entrance and check in at the main information desk  You need to re-schedule your appointment should you arrive 10 or more minutes late.  We strive to give you quality time with our providers, and arriving late affects you and other patients whose appointments are after yours.  Also, if you no show three or more times for appointments you may be dismissed from the clinic at the providers discretion.     Again, thank you for choosing [facility].  Our hope is that these requests will decrease the amount of time that you wait before being seen by our physicians.       _____________________________________________________________  Should you have questions after your visit to [facility], please contact our office at [phone] between the hours of 8:00 a.m. and 4:30 p.m.  Voicemails left after 4:00 p.m. will not be returned until the following business day.  For prescription refill requests, have your pharmacy contact our office and allow 72 hours.    Cancer Center Support Programs:   > Cancer Support Group  2nd Tuesday of the month 1pm-2pm, Journey Room

## 2020-09-19 ENCOUNTER — Other Ambulatory Visit (HOSPITAL_COMMUNITY): Payer: Self-pay

## 2020-09-19 ENCOUNTER — Encounter (HOSPITAL_COMMUNITY): Payer: Self-pay | Admitting: Hematology

## 2020-09-19 ENCOUNTER — Telehealth (HOSPITAL_COMMUNITY): Payer: Self-pay | Admitting: Pharmacy Technician

## 2020-09-19 NOTE — Telephone Encounter (Signed)
Oral Oncology Patient Advocate Encounter   Received notification from [facility] that prior authorization for Xeloda is required.   PA submitted on CoverMyMeds Key [id number] Status is pending   Oral Oncology Clinic will continue to follow.  [place] Patient [place] Phone [phone] Fax [phone] 09/19/2020 3:48 PM

## 2020-09-20 ENCOUNTER — Telehealth (HOSPITAL_COMMUNITY): Payer: Self-pay | Admitting: Pharmacist

## 2020-09-20 ENCOUNTER — Other Ambulatory Visit (HOSPITAL_COMMUNITY): Payer: Self-pay

## 2020-09-20 DIAGNOSIS — C187 Malignant neoplasm of sigmoid colon: Secondary | ICD-10-CM

## 2020-09-20 NOTE — Telephone Encounter (Signed)
Oral Oncology Pharmacist Encounter  Received new prescription for Xeloda (capecitabine) for the treatment of colon cancer in conjunction with XRT, planned duration until the end of radiation.  CMP from 09/04/20 assessed, SCr elevated at 1.27 mg/dL, CrCl ~67m/min. No capecitabine dose adjustment needed at this time. Prescription dose and frequency assessed.   Current medication list in Epic reviewed, no DDIs with capecitabine identified: **Allopurinol was previously on his medication list but a called the patient's wife who manages his medication and she reported he does not take the allopurinol. Allopurinol removed from his medication list.  Evaluated chart and no patient barriers to medication adherence identified.   [facility].  Oral Oncology Clinic will continue to follow for insurance authorization, copayment issues, initial counseling and start date.   ADarl Pikes PharmD, BCPS, BCOP, CPP Hematology/Oncology Clinical Pharmacist Practitioner [phone] 09/20/2020 12:01 PM

## 2020-09-23 ENCOUNTER — Encounter (HOSPITAL_COMMUNITY): Payer: Self-pay | Admitting: Hematology

## 2020-09-24 ENCOUNTER — Ambulatory Visit: Payer: 59 | Admitting: Neurology

## 2020-09-25 ENCOUNTER — Other Ambulatory Visit (HOSPITAL_COMMUNITY): Payer: Self-pay

## 2020-09-25 NOTE — Telephone Encounter (Signed)
Oral Oncology Patient Advocate Encounter  Prior Authorization for Xeloda has been approved.    PA# [id number] Effective dates: 09/19/20 through 09/18/21  Patients co-pay is $0.00  Oral Oncology Clinic will continue to follow.   [place] [facility] [place] Phone [phone] Fax [phone] 09/25/2020 2:13 PM

## 2020-09-27 ENCOUNTER — Other Ambulatory Visit (HOSPITAL_COMMUNITY): Payer: Self-pay

## 2020-09-27 MED ORDER — CAPECITABINE 500 MG PO TABS
1500.0000 mg | ORAL_TABLET | Freq: Two times a day (BID) | ORAL | 0 refills | Status: DC
  Filled 2020-09-27: qty 120, 28d supply, fill #0

## 2020-09-27 NOTE — Telephone Encounter (Signed)
Oral Chemotherapy Pharmacist Encounter  Troy Smith report that [name] has an appt with [facility] Rad[facility] on Thursday 10/03/20.  Patient Education I spoke with patient's wife Troy Smith for overview of new oral chemotherapy medication: Xeloda (capecitabine) for the treatment of colon cancer in conjunction with XRT, planned duration until the end of radiation.   Counseled Troy Smith on administration, dosing, side effects, monitoring, drug-food interactions, safe handling, storage, and disposal. Patient will take 3 tablets (1,500 mg total) by mouth 2 (two) times daily after a meal. Take Monday through Friday. Take only on days of radiation.  Side effects include but not limited to: diarrhea, hand-foot syndrome, mouth sores, edema, decreased wbc, fatigue, N/V.   Diarrhea: Mr. Poarch has had trouble with diarrhea in the past. They know to call the office if they have trouble with diarrhea management using his home medications Hand-foot syndrome: Suggested the use of Udderly Smooth Extra Care 20  Reviewed with Troy Smith importance of keeping a medication schedule and plan for any missed doses.  After discussion with Troy Smith no patient barriers to medication adherence identified. Troy Smith manages his medication.  Troy Smith voiced understanding and appreciation. All questions answered. Medication handout provided.  Provided Troy Smith with [facility]otherapy [facility] phone number. Troy Smith knows to call the office with questions or concerns. Oral Chemotherapy Navigation Clinic will continue to follow.  Troy Smith, PharmD, BCPS, BCOP, CPP Hematology/Oncology Clinical Pharmacist Practitioner [facility] [phone]  09/27/2020 10:48 AM

## 2020-09-30 ENCOUNTER — Other Ambulatory Visit (HOSPITAL_COMMUNITY): Payer: Self-pay

## 2020-10-02 ENCOUNTER — Other Ambulatory Visit (HOSPITAL_COMMUNITY): Payer: Self-pay

## 2020-10-28 ENCOUNTER — Other Ambulatory Visit (HOSPITAL_COMMUNITY): Payer: Self-pay

## 2020-10-29 ENCOUNTER — Other Ambulatory Visit (HOSPITAL_COMMUNITY): Payer: Self-pay

## 2020-11-01 ENCOUNTER — Encounter (HOSPITAL_COMMUNITY): Payer: Self-pay | Admitting: Hematology

## 2020-11-04 ENCOUNTER — Encounter (HOSPITAL_COMMUNITY): Payer: Self-pay | Admitting: Hematology

## 2020-11-05 ENCOUNTER — Inpatient Hospital Stay (HOSPITAL_COMMUNITY): Payer: BC Managed Care – PPO

## 2020-11-05 ENCOUNTER — Other Ambulatory Visit: Payer: Self-pay

## 2020-11-05 DIAGNOSIS — Z23 Encounter for immunization: Secondary | ICD-10-CM | POA: Insufficient documentation

## 2020-11-05 DIAGNOSIS — C187 Malignant neoplasm of sigmoid colon: Secondary | ICD-10-CM | POA: Insufficient documentation

## 2020-11-05 DIAGNOSIS — C787 Secondary malignant neoplasm of liver and intrahepatic bile duct: Secondary | ICD-10-CM | POA: Insufficient documentation

## 2020-11-05 LAB — CBC WITH DIFFERENTIAL/PLATELET
Abs Immature Granulocytes: 0.02 10*3/uL (ref 0.00–0.07)
Basophils Absolute: 0 10*3/uL (ref 0.0–0.1)
Basophils Relative: 1 %
Eosinophils Absolute: 0.1 10*3/uL (ref 0.0–0.5)
Eosinophils Relative: 3 %
HCT: 30.9 % — ABNORMAL LOW (ref 39.0–52.0)
Hemoglobin: 9.8 g/dL — ABNORMAL LOW (ref 13.0–17.0)
Immature Granulocytes: 1 %
Lymphocytes Relative: 18 %
Lymphs Abs: 0.7 10*3/uL (ref 0.7–4.0)
MCH: 30 pg (ref 26.0–34.0)
MCHC: 31.7 g/dL (ref 30.0–36.0)
MCV: 94.5 fL (ref 80.0–100.0)
Monocytes Absolute: 0.2 10*3/uL (ref 0.1–1.0)
Monocytes Relative: 6 %
Neutro Abs: 2.8 10*3/uL (ref 1.7–7.7)
Neutrophils Relative %: 71 %
Platelets: 178 10*3/uL (ref 150–400)
RBC: 3.27 MIL/uL — ABNORMAL LOW (ref 4.22–5.81)
RDW: 13.8 % (ref 11.5–15.5)
WBC: 3.9 10*3/uL — ABNORMAL LOW (ref 4.0–10.5)
nRBC: 0 % (ref 0.0–0.2)

## 2020-11-05 LAB — COMPREHENSIVE METABOLIC PANEL
ALT: 8 U/L (ref 0–44)
AST: 11 U/L — ABNORMAL LOW (ref 15–41)
Albumin: 3.5 g/dL (ref 3.5–5.0)
Alkaline Phosphatase: 88 U/L (ref 38–126)
Anion gap: 6 (ref 5–15)
BUN: 15 mg/dL (ref 8–23)
CO2: 26 mmol/L (ref 22–32)
Calcium: 9.1 mg/dL (ref 8.9–10.3)
Chloride: 109 mmol/L (ref 98–111)
Creatinine, Ser: 1.07 mg/dL (ref 0.61–1.24)
GFR, Estimated: >60 mL/min (ref >60)
Glucose, Bld: 115 mg/dL — ABNORMAL HIGH (ref 70–99)
Potassium: 4 mmol/L (ref 3.5–5.1)
Sodium: 141 mmol/L (ref 135–145)
Total Bilirubin: 0.5 mg/dL (ref 0.3–1.2)
Total Protein: 6.4 g/dL — ABNORMAL LOW (ref 6.5–8.1)

## 2020-11-05 NOTE — Progress Notes (Signed)
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 Focal narrowing and wall thickening of the sigmoid colon is noted in this area most likely due to inflammation, but neoplasm cannot be excluded, and sigmoidoscopy is recommended for further evaluation.   03/20/2016 Procedure   Colonoscopy by Dr. Laural Golden- Malignant partially obstructing tumor in the distal sigmoid colon. Biopsied. - Incomplete exam.   03/23/2016 Pathology Results   Colon, biopsy, sigmoid mass - ADENOCARCINOMA.   03/23/2016 Procedure   Low anterior resection with transanal anastomosis   03/26/2016 Pathology Results   Colon, segmental resection for tumor, sigmoid - INVASIVE WELL DIFFERENTIATED ADENOCARCINOMA, SPANNING 5 CM IN GREATEST DIMENSION. - TUMOR INVADES THROUGH MUSCULARIS PROPRIA TO INVOLVE SUBSEROSAL SOFT TISSUES. - UNDERLYING MARKED ACUTE AND CHRONIC INFLAMMATION WITH GIANT CELL REACTION AND ABSCESS FORMATION EXTENDING TO THE SEROSAL SURFACE AND INVOLVING THE MESENTERIC TISSUE. - MARGINS ARE NEGATIVE FOR TUMOR. - FOURTEEN BENIGN LYMPH NODES WHICH DEMONSTRATE VARIABLE INVOLVEMENT WITH ACUTE AND CHRONIC INFLAMMATION WITH ASSOCIATED GIANT CELLS WITHOUT TUMOR IDENTIFIED (0/14).   04/08/2016 Cancer Staging   Cancer Staging Adenocarcinoma of sigmoid colon [facility]) Staging form: Colon and Rectum, AJCC 8th Edition - Pathologic stage from 04/01/2016: Stage IIA (pT3, pN0, cM0) - Signed by Baird Cancer, PA-C on 04/01/2016    07/09/2016 Imaging   CT abd/pelvis- No evidence of metastatic disease or other acute findings.  Bilateral nonobstructing renal calculi and mild bilateral renal parenchymal scarring.  Stable mildly enlarged prostate gland and findings of chronic bladder outlet obstruction.   08/16/2018 -  Chemotherapy    Patient is on Treatment Plan: COLORECTAL FOLFOXIRI          CANCER STAGING: Cancer Staging Adenocarcinoma of sigmoid colon [facility]) Staging form: Colon and Rectum, AJCC 8th Edition - Pathologic stage from 04/01/2016: Stage IIA (pT3, pN0, cM0) - Signed by Baird Cancer, PA-C on 04/01/2016   INTERVAL HISTORY:  [name], a 65 y.o. male, returns for routine follow-up of his FOLFOX every 2 weeks. Troy Smith was last seen on 09/18/2020.   Today he reports feeling good. He reports occasional diarrhea occurring 2-3 times a week which is helped by lomotil, and he reports good appetite. He is scheduled to complete radiation 10/14. He denies tingling/numbness, skin peeling, mouth sores, nausea, and vomiting.   REVIEW OF SYSTEMS:  Review of Systems  Constitutional:  Negative for appetite change and fatigue.  HENT:   Negative for mouth sores.   Gastrointestinal:  Positive for diarrhea. Negative for nausea and vomiting.  Neurological:  Negative for numbness.  All other systems reviewed and are negative.  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[place]) 03/23/2016   Carpal tunnel syndrome 05/02/2014   Bilateral   Cervical disc disorder with radiculopathy of cervical region 05/02/2014   Left C8   Diabetes mellitus without complication ([place])    Hypertension    Port-A-Cath in place 08/10/2018   Past Surgical History:  Procedure Laterality Date  BIOPSY  03/20/2016   Procedure: BIOPSY;  Surgeon: Rogene Houston, MD;  Location[facility];  Service: Endoscopy;;  colon   BOWEL RESECTION N/A  03/23/2016   Procedure: LOW ANTERIOR RESECTION;  Surgeon: Aviva Signs, MD;  Location: [facility] ORS;  Service: General;  Laterality: N/A;   COLON SURGERY  01/2019   Done at [place] 03/20/2016   Procedure: COLONOSCOPY;  Surgeon: Rogene Houston, MD;  Location: [facility] ENDO [facility];  Service: Endoscopy;  Laterality: N/A;  730   COLONOSCOPY N/A 07/10/2016   Procedure: COLONOSCOPY;  Surgeon: Rogene Houston, MD;  Location: [facility];  Service: Endoscopy;  Laterality: N/A;  1040   NO PAST SURGERIES     POLYPECTOMY  07/10/2016   Procedure: POLYPECTOMY;  Surgeon: Rogene Houston, MD;  Location: [facility];  Service: Endoscopy;;  colon   PORTACATH PLACEMENT Left 08/05/2018   Procedure: INSERTION PORT-A-CATH;  Surgeon: Aviva Signs, MD;  Location: [facility] ORS;  Service: General;  Laterality: Left;   TONSILLECTOMY      SOCIAL HISTORY:  Social History   Socioeconomic History   Marital status: Married    Spouse name: Not on file   Number of children: Not on file   Years of education: Not on file   Highest education level: Not on file  Occupational History   Not on file  Tobacco Use   Smoking status: Never   Smokeless tobacco: Never  Vaping Use   Vaping Use: Never used  Substance and Sexual Activity   Alcohol use: Yes    Comment: rarely   Drug use: No   Sexual activity: Not on file  Other Topics Concern   Not on file  Social History Narrative   Not on file   Social Determinants of Health   Financial Resource Strain: Not on file  Food Insecurity: Not on file  Transportation Needs: No Transportation Needs   Lack of Transportation (Medical): No   Lack of Transportation (Non-Medical): No  Physical Activity: Inactive   Days of Exercise per Week: 0 days   Minutes of Exercise per Session: 0 min  Stress: Not on file  Social Connections: Not on file  Intimate Partner Violence: Not At Risk   Fear of Current or Ex-Partner: No   Emotionally Abused: No   Physically Abused: No    Sexually Abused: No    FAMILY HISTORY:  Family History  Problem Relation Age of Onset   COPD Mother    Melanoma Father     CURRENT MEDICATIONS:  Current Outpatient Medications  Medication Sig Dispense Refill   amLODipine (NORVASC) 10 MG tablet Take 10 mg by mouth daily.     capecitabine (XELODA) 500 MG tablet Take 3 tablets (1,500 mg total) by mouth 2 (two) times daily after a meal. Take Monday through Friday. Take only on days of radiation. 168 tablet 0   dexamethasone (DECADRON) 4 MG tablet Take 1 tablet (4 mg total) by mouth daily. Take in the morning with food 30 tablet 0   diphenoxylate-atropine (LOMOTIL) 2.5-0.025 MG tablet TAKE 2 AFTER FIRST ONSET OF DIARRHEA OR LOOSE STOOL, THEN 1 AFTER EACH LOOSE STOOL. 60 tablet 0   donepezil (ARICEPT) 10 MG tablet TAKE (1) TABLET BY MOUTH AT BEDTIME. 30 tablet 6   insulin glargine (LANTUS) 100 UNIT/ML injection Inject 30 Units into the skin at bedtime.      loperamide (IMODIUM A-D) 2 MG tablet Take 2 at onset of diarrhea, then  1 after each watery bowel movement. 100 tablet 1   megestrol (MEGACE) 400 MG/10ML suspension 2 teaspoons in the morning and 2 teaspoons in the evening 240 mL 0   memantine (NAMENDA) 10 MG tablet Take 1 tablet (10 mg total) by mouth 2 (two) times daily. 60 tablet 5   metoprolol succinate (TOPROL-XL) 50 MG 24 hr tablet Take 50 mg by mouth daily. Take with or immediately following a meal.     potassium chloride SA (KLOR-CON) 20 MEQ tablet Take 1 tablet (20 mEq total) by mouth daily. 30 tablet 3   prochlorperazine (COMPAZINE) 10 MG tablet Take 1 tablet (10 mg total) by mouth every 6 (six) hours as needed (Nausea or vomiting). 60 tablet 1   scopolamine (TRANSDERM-SCOP) 1 MG/3DAYS Place 1 patch (1.5 mg total) onto the skin every 3 (three) days. 10 patch 12   No current facility-administered medications for this visit.   Facility-Administered Medications Ordered in Other Visits  Medication Dose Route Frequency Provider Last  Rate Last Admin   heparin lock flush 100 unit/mL  500 Units Intracatheter Once PRN Derek Jack, MD       sodium chloride flush (NS) 0.9 % injection 10 mL  10 mL Intracatheter PRN Derek Jack, MD        ALLERGIES:  Allergies  Allergen Reactions   Penicillin G Nausea And Vomiting    PHYSICAL EXAM:  Performance status (ECOG): 1 - Symptomatic but completely ambulatory  There were no vitals filed for this visit. Wt Readings from Last 3 Encounters:  09/04/20 171 lb 12.8 oz (77.9 kg)  08/21/20 177 lb 14.4 oz (80.7 kg)  08/07/20 181 lb 12.8 oz (82.5 kg)   Physical Exam Vitals reviewed.  Constitutional:      Appearance: Normal appearance.  Cardiovascular:     Rate and Rhythm: Normal rate and regular rhythm.     Pulses: Normal pulses.     Heart sounds: Normal heart sounds.  Pulmonary:     Effort: Pulmonary effort is normal.     Breath sounds: Normal breath sounds.  Neurological:     General: No focal deficit present.     Mental Status: He is alert and oriented to person, place, and time.  Psychiatric:        Mood and Affect: Mood normal.        Behavior: Behavior normal.     LABORATORY DATA:  I have reviewed the labs as listed.  CBC Latest Ref Rng & Units 11/05/2020 09/04/2020 08/21/2020  WBC 4.0 - 10.5 K/uL 3.9(L) 6.6 5.7  Hemoglobin 13.0 - 17.0 g/dL 9.8(L) 9.3(L) 9.2(L)  Hematocrit 39.0 - 52.0 % 30.9(L) 28.1(L) 28.0(L)  Platelets 150 - 400 K/uL 178 110(L) 112(L)   CMP Latest Ref Rng & Units 11/05/2020 09/04/2020 08/21/2020  Glucose 70 - 99 mg/dL 115(H) 92 102(H)  BUN 8 - 23 mg/dL 15 11 14   Creatinine 0.61 - 1.24 mg/dL 1.07 1.27(H) 1.39(H)  Sodium 135 - 145 mmol/L 141 136 137  Potassium 3.5 - 5.1 mmol/L 4.0 2.9(L) 3.5  Chloride 98 - 111 mmol/L 109 106 107  CO2 22 - 32 mmol/L 26 24 22   Calcium 8.9 - 10.3 mg/dL 9.1 8.7(L) 8.4(L)  Total Protein 6.5 - 8.1 g/dL 6.4(L) 5.7(L) 5.9(L)  Total Bilirubin 0.3 - 1.2 mg/dL 0.5 1.1 0.9  Alkaline Phos 38 - 126 U/L 88 107  113  AST 15 - 41 U/L 11(L) 14(L) 13(L)  ALT 0 - 44 U/L 8 11 13  DIAGNOSTIC IMAGING:  I have independently reviewed the scans and discussed with the patient. No results found.   ASSESSMENT:  1.  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 -Laparoscopic partial hepatectomy, redo low anterior resection on 02/01/2019, pathology showing YPT3Y PN 0.  Positive circumferential/radial/mesenteric margin. -6 more cycles of FOLFOX completed on 05/31/2019. -Xeloda with radiation from 06/26/2019 through 08/03/2019. -CT CAP on 09/14/2019 shows postoperative changes in the rectosigmoid resection and reanastomosis with interval increase in posttreatment perirectal and presacral soft tissue thickening consistent with radiation changes.  No adenopathy.  Multiple stable small bilateral pulmonary nodules, likely benign.  No new evidence of metastatic disease.  Unchanged splenomegaly. - PET scan on 04/08/2020 done secondary to increase in CEA shows abnormal hypermetabolic soft tissue in the presacral region suspicious for recurrence.  Sclerosis involving sacrum.  Small left axillary lymph nodes favored to be reactive. - MRI pelvis on 04/26/2020 with enhancing lesion involving S1 segment of the sacrum concerning for metastatic disease.  Peripherally enhancing soft tissue nodule within the presacral soft tissues measuring 2.4 x 2.1 cm. - PET scan on 07/01/2020 showed hypermetabolic soft tissue lesion in the presacral space shows interval progression.  Destructive changes in the sacrum also mildly progressive.  Interval development of diffuse marrow uptake, likely from growth factor. - I talked to Dr. Oneita Kras from radiation oncology at [facility].  She has recommended 2 to 3 months of chemotherapy followed by repeat pelvic MRI. - Reviewed CT chest and  abdomen from 09/16/2020 which showed stable small bilateral lung nodules measuring 4 mm and smaller, most likely benign.  No evidence of metastatic disease in the chest or abdomen. - Reviewed MRI pelvis with and without contrast from 09/16/2024.  Although it is reported that there is progression of the presacral mass, this MRI was compared to MRI from 04/26/2020.  He started treatment towards the end of May.  The presacral mass has slightly improved compared to the PET scan which was done around the time of starting chemotherapy in May.  However the sacral lesion has increased in size measuring 9.9 x 5.8 cm.  PLAN:  1.  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 Oneita Kras at [facility]. - He is taking Xeloda 3 tablets twice daily Monday through Friday. - He reported occasional diarrhea which is well controlled with Lomotil.  No nausea or vomiting.  No tingling or numbness.  No findings of mucositis or hand-foot skin reaction. - Reviewed labs today which showed LFTs were grossly normal.  CBC shows white count is low at 3.9 but ANC was normal. - He will receive the flu shot today.  Continue Xeloda at the same dose.  No dose modifications necessary. - He will reportedly complete XRT on 11/22/2020.  We will plan to reevaluate next week with labs and toxicity assessment.   2.  Diarrhea: - Diarrhea has improved.  He has diarrhea episodes 2-3 times per week.  Lomotil helps.   3.  Hypomagnesemia: - Continue magnesium twice daily.   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   5.  Gout: - Not requiring allopurinol at this time.  No acute gout episodes.   6.  CKD: - Creatinine has improved to 1.07 from 1.27 previously.   7.  Lower  extremity swelling: - He is not requiring Lasix on a daily basis.  8.  Weight loss: - He is no longer taking Megace.  Weight is stable and appetite is good.   Orders placed this  encounter:  No orders of the defined types were placed in this encounter.    Derek Jack, MD [place] [phone]   I, Thana Ates, am acting as a scribe for Dr. Derek Jack.  I, Derek Jack MD, have reviewed the above documentation for accuracy and completeness, and I agree with the above.

## 2020-11-06 ENCOUNTER — Inpatient Hospital Stay (HOSPITAL_COMMUNITY): Payer: BC Managed Care – PPO | Attending: Hematology | Admitting: Hematology

## 2020-11-06 ENCOUNTER — Other Ambulatory Visit (HOSPITAL_COMMUNITY): Payer: BC Managed Care – PPO

## 2020-11-06 VITALS — BP 128/78 | HR 65 | Temp 97.7°F | Resp 18 | Wt 181.8 lb

## 2020-11-06 DIAGNOSIS — Z23 Encounter for immunization: Secondary | ICD-10-CM

## 2020-11-06 DIAGNOSIS — C187 Malignant neoplasm of sigmoid colon: Secondary | ICD-10-CM | POA: Diagnosis not present

## 2020-11-06 MED ORDER — INFLUENZA VAC SPLIT QUAD 0.5 ML IM SUSY
0.5000 mL | PREFILLED_SYRINGE | Freq: Once | INTRAMUSCULAR | Status: AC
  Administered 2020-11-06: 0.5 mL via INTRAMUSCULAR
  Filled 2020-11-06: qty 0.5

## 2020-11-06 NOTE — Patient Instructions (Signed)
[facility] at [facility] Discharge Instructions  You were seen today by Dr. Katragadda. He went over your recent results. Dr. Katragadda will see you back in 1 week for labs and follow up.   Thank you for choosing [facility] at [facility] to provide your oncology and hematology care.  To afford each patient quality time with our provider, please arrive at least 15 minutes before your scheduled appointment time.   If you have a lab appointment with the Cancer Center please come in thru the Main Entrance and check in at the main information desk  You need to re-schedule your appointment should you arrive 10 or more minutes late.  We strive to give you quality time with our providers, and arriving late affects you and other patients whose appointments are after yours.  Also, if you no show three or more times for appointments you may be dismissed from the clinic at the providers discretion.     Again, thank you for choosing [facility].  Our hope is that these requests will decrease the amount of time that you wait before being seen by our physicians.       _____________________________________________________________  Should you have questions after your visit to [facility], please contact our office at [phone] between the hours of 8:00 a.m. and 4:30 p.m.  Voicemails left after 4:00 p.m. will not be returned until the following business day.  For prescription refill requests, have your pharmacy contact our office and allow 72 hours.    Cancer Center Support Programs:   > Cancer Support Group  2nd Tuesday of the month 1pm-2pm, Journey Room   

## 2020-11-06 NOTE — Progress Notes (Signed)
Patient is taking Xeloda as prescribed.  They have not missed any doses and report no side effects at this time.

## 2020-11-06 NOTE — Progress Notes (Signed)
Patient to have flu shot today per Dr. Delton Coombes. Patient reports no prior allergies or complications with previous flu shot. Flu shot given via IM injection into L deltoid, site covered with a bandaid. Patient tolerated injection well and discharged from clinic ambulatory and in satisfactory condition with his wife.

## 2020-11-07 ENCOUNTER — Ambulatory Visit (HOSPITAL_COMMUNITY): Payer: BC Managed Care – PPO | Admitting: Hematology

## 2020-11-08 ENCOUNTER — Encounter: Payer: Self-pay | Admitting: Neurology

## 2020-11-08 ENCOUNTER — Encounter (HOSPITAL_COMMUNITY): Payer: Self-pay | Admitting: Hematology

## 2020-11-08 ENCOUNTER — Ambulatory Visit (INDEPENDENT_AMBULATORY_CARE_PROVIDER_SITE_OTHER): Payer: BC Managed Care – PPO | Admitting: Neurology

## 2020-11-08 DIAGNOSIS — F0280 Dementia in other diseases classified elsewhere without behavioral disturbance: Secondary | ICD-10-CM

## 2020-11-08 DIAGNOSIS — G300 Alzheimer's disease with early onset: Secondary | ICD-10-CM | POA: Diagnosis not present

## 2020-11-08 DIAGNOSIS — C187 Malignant neoplasm of sigmoid colon: Secondary | ICD-10-CM

## 2020-11-08 DIAGNOSIS — F0390 Unspecified dementia without behavioral disturbance: Secondary | ICD-10-CM

## 2020-11-08 HISTORY — DX: Unspecified dementia, unspecified severity, without behavioral disturbance, psychotic disturbance, mood disturbance, and anxiety: F03.90

## 2020-11-08 MED ORDER — MEMANTINE HCL 10 MG PO TABS: 10.0000 mg | ORAL_TABLET | Freq: Two times a day (BID) | ORAL | 3 refills | Status: DC

## 2020-11-08 MED ORDER — DONEPEZIL HCL 10 MG PO TABS: ORAL_TABLET | ORAL | 3 refills | Status: DC

## 2020-11-08 NOTE — Progress Notes (Signed)
Reason for visit: Memory disorder  Troy Smith is an 65 y.o. 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  He has a history of rectal carcinoma, he had recurrence of cancer and had to go through chemotherapy again in May 2022 and he is currently getting radiation treatments with 1 more treatment left.  The patient has had some cognitive decline around the time of the chemotherapy but this is now improving.  He is on Namenda and Aricept.  He is having occasional episodes of diarrhea but this is improving off of the chemotherapy.  He is sleeping fairly well, he does report some occasional vivid dreams.  The patient tolerates the Namenda well.  He comes in with his wife.  He has not altered any activities of daily living since last seen.  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  He requires assistance keeping up with medications and appointments.  He denies any headaches or any numbness or weakness or balance changes that have occurred since last seen.  He returns for further evaluation.  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   Carpal tunnel syndrome 05/02/2014   Bilateral   Cervical disc disorder with radiculopathy of cervical region 05/02/2014   Left C8   Colon cancer ([place]) 06/2020   Dementia ([place]) 11/08/2020   Diabetes mellitus without complication ([place])    Hypertension    Port-A-Cath in place 08/10/2018    Past Surgical History:  Procedure Laterality Date   BIOPSY  03/20/2016   Procedure: BIOPSY;  Surgeon: Troy Houston, MD;  [facility];  Service: Endoscopy;;  colon   BOWEL RESECTION N/A 03/23/2016   Procedure: LOW ANTERIOR RESECTION;  Surgeon: Troy Signs, MD;  Location: [facility];  Service: General;  Laterality: N/A;   COLON SURGERY  01/2019   Done at [place] 03/20/2016    Procedure: COLONOSCOPY;  Surgeon: Troy Houston, MD;  Location: [facility];  Service: Endoscopy;  Laterality: N/A;  730   COLONOSCOPY N/A 07/10/2016   Procedure: COLONOSCOPY;  Surgeon: Troy Houston, MD;  [facility];  Service: Endoscopy;  Laterality: N/A;  1040   NO PAST SURGERIES     POLYPECTOMY  07/10/2016   Procedure: POLYPECTOMY;  Surgeon: Troy Houston, MD;  [facility];  Service: Endoscopy;;  colon   PORTACATH PLACEMENT Left 08/05/2018   Procedure: INSERTION PORT-A-CATH;  Surgeon: Troy Signs, MD;  Location: [facility];  Service: General;  Laterality: Left;   TONSILLECTOMY      Family History  Problem Relation Age of Onset   COPD Mother    Melanoma Father     Social history:  reports that he has never smoked. He has never used smokeless tobacco. He reports current alcohol use. He reports that he does not use drugs.    Allergies  Allergen Reactions   Penicillin G Nausea And Vomiting    Medications:  Prior to Admission medications   Medication Sig Start Date End Date Taking? Authorizing Provider  amLODipine (NORVASC) 10 MG tablet Take 10 mg by mouth daily.    [provider]  capecitabine (XELODA) 500 MG tablet Take 3 tablets (1,500 mg total) by mouth 2 (two) times daily after a meal. Take Monday through Friday. Take only on days of  radiation. 09/27/20   Troy Jack, MD  dexamethasone (DECADRON) 4 MG tablet Take 1 tablet (4 mg total) by mouth daily. Take in the morning with food 08/07/20   Troy Lark, MD  diphenoxylate-atropine (LOMOTIL) 2.5-0.025 MG tablet TAKE 2 AFTER FIRST ONSET OF DIARRHEA OR LOOSE STOOL, THEN 1 AFTER EACH LOOSE STOOL. 07/31/20   Troy Lark, MD  donepezil (ARICEPT) 10 MG tablet TAKE (1) TABLET BY MOUTH AT BEDTIME. 04/29/20   Troy Jack, MD  insulin glargine (LANTUS) 100 UNIT/ML injection Inject 30 Units into the skin at bedtime.     [provider]  loperamide (IMODIUM A-D) 2 MG tablet Take 2 at  onset of diarrhea, then 1 after each watery bowel movement. 09/18/20   Troy Jack, MD  megestrol (MEGACE) 400 MG/10ML suspension 2 teaspoons in the morning and 2 teaspoons in the evening 08/21/20   Troy Jack, MD  memantine (NAMENDA) 10 MG tablet Take 1 tablet (10 mg total) by mouth 2 (two) times daily. 05/08/20   Troy Ducking, MD  metoprolol succinate (TOPROL-XL) 50 MG 24 hr tablet Take 50 mg by mouth daily. Take with or immediately following a meal.    [provider]  potassium chloride SA (KLOR-CON) 20 MEQ tablet Take 1 tablet (20 mEq total) by mouth daily. 09/04/20   Troy Jack, MD  prochlorperazine (COMPAZINE) 10 MG tablet Take 1 tablet (10 mg total) by mouth every 6 (six) hours as needed (Nausea or vomiting). 03/13/19   Troy Jack, MD  scopolamine (TRANSDERM-SCOP) 1 MG/3DAYS Place 1 patch (1.5 mg total) onto the skin every 3 (three) days. 08/29/18   Lockamy, Theresia Lo, NP-C    ROS:  Out of a complete 14 system review of symptoms, the patient complains only of the following symptoms, and all other reviewed systems are negative.  Memory problems Diarrhea  Blood pressure 128/75, pulse 64, height 5\' 11"  (1.803 m), weight 180 lb (81.6 kg).  Physical Exam  General: The patient is alert and cooperative at the time of the examination.  Skin: No significant peripheral edema is noted.   Neurologic Exam  Mental status: The patient is alert and oriented x 3 at the time of the examination. The Mini-Mental status examination done today shows a total score of 16/30 the patient is able to name 3 animals in 60 seconds.   Cranial nerves: Facial symmetry is present. Speech is normal, no aphasia or dysarthria is noted. Extraocular movements are full. Visual fields are full.  Motor: The patient has good strength in all 4 extremities.  Sensory examination: Soft touch sensation is symmetric on the face, arms, and legs.  Coordination: The patient has  good finger-nose-finger and heel-to-shin bilaterally.  Apraxia is noted with finger-nose-finger bilaterally, worse with the left hand.  Gait and station: The patient has a normal gait. Tandem gait is unsteady. Romberg is negative. No drift is seen.  Reflexes: Deep tendon reflexes are symmetric.   Assessment/Plan:  1.  Dementia, probable Alzheimer's disease  2.  Rectal carcinoma  The patient will continue the Aricept and Namenda.  He continues to have some decline in cognitive functioning.  He will follow-up in 6 months, in the future he can be seen through Dr. Leta Baptist.  Jill Alexanders MD 11/08/2020 9:32 AM  [facility] [address] [place], [place] [address]  Phone [phone] Fax [phone]

## 2020-11-11 ENCOUNTER — Other Ambulatory Visit (HOSPITAL_COMMUNITY): Payer: Self-pay | Admitting: *Deleted

## 2020-11-11 DIAGNOSIS — C187 Malignant neoplasm of sigmoid colon: Secondary | ICD-10-CM

## 2020-11-12 ENCOUNTER — Ambulatory Visit: Payer: 59 | Admitting: Neurology

## 2020-11-12 NOTE — Progress Notes (Signed)
[place], [place] [address]   CLINIC:  Medical Oncology/Hematology  PCP:  [name], [place] / [place] [address] [phone]   REASON FOR VISIT:  Follow-up for metastatic colon cancer with local recurrence  PRIOR THERAPY: Chemotherapy followed by chemoradiation  NGS Results: not done  CURRENT THERAPY: FOLFOX every 2 weeks  BRIEF ONCOLOGIC HISTORY:  Oncology History  Adenocarcinoma of sigmoid colon ([place])  11/20/2015 Imaging   CT abd/pelvis- Focal sigmoid diverticulitis is noted without abscess formation. Focal narrowing and wall thickening of the sigmoid colon is noted in this area most likely due to inflammation, but neoplasm cannot be excluded, and sigmoidoscopy is recommended for further evaluation.   03/20/2016 Procedure   Colonoscopy by Dr. Laural Golden- Malignant partially obstructing tumor in the distal sigmoid colon. Biopsied. - Incomplete exam.   03/23/2016 Pathology Results   Colon, biopsy, sigmoid mass - ADENOCARCINOMA.   03/23/2016 Procedure   Low anterior resection with transanal anastomosis   03/26/2016 Pathology Results   Colon, segmental resection for tumor, sigmoid - INVASIVE WELL DIFFERENTIATED ADENOCARCINOMA, SPANNING 5 CM IN GREATEST DIMENSION. - TUMOR INVADES THROUGH MUSCULARIS PROPRIA TO INVOLVE SUBSEROSAL SOFT TISSUES. - UNDERLYING MARKED ACUTE AND CHRONIC INFLAMMATION WITH GIANT CELL REACTION AND ABSCESS FORMATION EXTENDING TO THE SEROSAL SURFACE AND INVOLVING THE MESENTERIC TISSUE. - MARGINS ARE NEGATIVE FOR TUMOR. - FOURTEEN BENIGN LYMPH NODES WHICH DEMONSTRATE VARIABLE INVOLVEMENT WITH ACUTE AND CHRONIC INFLAMMATION WITH ASSOCIATED GIANT CELLS WITHOUT TUMOR IDENTIFIED (0/14).   04/08/2016 Cancer Staging   Cancer Staging Adenocarcinoma of sigmoid colon [facility]) Staging form: Colon and Rectum, AJCC 8th Edition - Pathologic stage from 04/01/2016: Stage IIA (pT3, pN0, cM0) - Signed by Baird Cancer, PA-C on 04/01/2016    07/09/2016 Imaging   CT abd/pelvis- No evidence of metastatic disease or other acute findings.  Bilateral nonobstructing renal calculi and mild bilateral renal parenchymal scarring.  Stable mildly enlarged prostate gland and findings of chronic bladder outlet obstruction.   08/16/2018 -  Chemotherapy    Patient is on Treatment Plan: COLORECTAL FOLFOXIRI          CANCER STAGING: Cancer Staging Adenocarcinoma of sigmoid colon [facility]) Staging form: Colon and Rectum, AJCC 8th Edition - Pathologic stage from 04/01/2016: Stage IIA (pT3, pN0, cM0) - Signed by Baird Cancer, PA-C on 04/01/2016   INTERVAL HISTORY:  Troy Smith, a 65 y.o. male, returns for routine follow-up of his metastatic colon cancer with local recurrence. Troy Smith was last seen on 11/06/2020.   Today he reports feeling good. He reports diarrhea 2-3 days a week occurring 1-2 times during the day. He take 2 lomotil prn. His appetite is good, and he denies fatigue. He denies nausea and vomiting.   REVIEW OF SYSTEMS:  Review of Systems  Constitutional:  Negative for appetite change and fatigue.  Gastrointestinal:  Positive for diarrhea. Negative for nausea and vomiting.  All other systems reviewed and are negative.  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[place]) 03/23/2016   Carpal tunnel syndrome 05/02/2014   Bilateral   Cervical disc disorder with radiculopathy of cervical region 05/02/2014   Left C8   Colon cancer ([place]) 06/2020   Dementia ([address]) 11/08/2020   Diabetes mellitus without complication ([place])    Hypertension    Port-A-Cath in place 08/10/2018   Past Surgical History:  Procedure Laterality Date   BIOPSY  03/20/2016   Procedure: BIOPSY;  Surgeon: Rogene Houston, MD;  [facility];  Service: Endoscopy;;  colon   BOWEL RESECTION N/A 03/23/2016   Procedure: LOW ANTERIOR RESECTION;  Surgeon:  Aviva Signs, MD;  Location: [facility];  Service: General;  Laterality: N/A;   COLON SURGERY  01/2019   Done at [place] 03/20/2016   Procedure: COLONOSCOPY;  Surgeon: Rogene Houston, MD;  Location[facility];  Service: Endoscopy;  Laterality: N/A;  730   COLONOSCOPY N/A 07/10/2016   Procedure: COLONOSCOPY;  Surgeon: Rogene Houston, MD;  [facility];  Service: Endoscopy;  Laterality: N/A;  1040   NO PAST SURGERIES     POLYPECTOMY  07/10/2016   Procedure: POLYPECTOMY;  Surgeon: Rogene Houston, MD;  [facility];  Service: Endoscopy;;  colon   PORTACATH PLACEMENT Left 08/05/2018   Procedure: INSERTION PORT-A-CATH;  Surgeon: Aviva Signs, MD;  Location: [facility];  Service: General;  Laterality: Left;   TONSILLECTOMY      SOCIAL HISTORY:  Social History   Socioeconomic History   Marital status: Married    Spouse name: Not on file   Number of children: Not on file   Years of education: Not on file   Highest education level: Not on file  Occupational History   Not on file  Tobacco Use   Smoking status: Never   Smokeless tobacco: Never  Vaping Use   Vaping Use: Never used  Substance and Sexual Activity   Alcohol use: Yes    Comment: rarely   Drug use: No   Sexual activity: Not on file  Other Topics Concern   Not on file  Social History Narrative   Not on file   Social Determinants of Health   Financial Resource Strain: Not on file  Food Insecurity: Not on file  Transportation Needs: No Transportation Needs   Lack of Transportation (Medical): No   Lack of Transportation (Non-Medical): No  Physical Activity: Inactive   Days of Exercise per Week: 0 days   Minutes of Exercise per Session: 0 min  Stress: Not on file  Social Connections: Not on file  Intimate Partner Violence: Not At Risk   Fear of Current or Ex-Partner: No   Emotionally Abused: No   Physically Abused: No   Sexually Abused: No    FAMILY HISTORY:  Family History   Problem Relation Age of Onset   COPD Mother    Melanoma Father     CURRENT MEDICATIONS:  Current Outpatient Medications  Medication Sig Dispense Refill   amLODipine (NORVASC) 10 MG tablet Take 10 mg by mouth daily.     capecitabine (XELODA) 500 MG tablet Take 3 tablets (1,500 mg total) by mouth 2 (two) times daily after a meal. Take Monday through Friday. Take only on days of radiation. 168 tablet 0   dexamethasone (DECADRON) 4 MG tablet Take 1 tablet (4 mg total) by mouth daily. Take in the morning with food 30 tablet 0   diphenoxylate-atropine (LOMOTIL) 2.5-0.025 MG tablet TAKE 2 AFTER FIRST ONSET OF DIARRHEA OR LOOSE STOOL, THEN 1 AFTER EACH LOOSE STOOL. 60 tablet 0   donepezil (ARICEPT) 10 MG tablet TAKE (1) TABLET BY MOUTH AT BEDTIME. 90 tablet 3   insulin glargine (LANTUS) 100 UNIT/ML injection Inject 30 Units into the skin at bedtime.      loperamide (IMODIUM A-D) 2 MG tablet Take 2 at onset of diarrhea, then 1 after each watery bowel movement. 100 tablet  1   megestrol (MEGACE) 400 MG/10ML suspension 2 teaspoons in the morning and 2 teaspoons in the evening 240 mL 0   memantine (NAMENDA) 10 MG tablet Take 1 tablet (10 mg total) by mouth 2 (two) times daily. 180 tablet 3   metoprolol succinate (TOPROL-XL) 50 MG 24 hr tablet Take 50 mg by mouth daily. Take with or immediately following a meal.     potassium chloride SA (KLOR-CON) 20 MEQ tablet Take 1 tablet (20 mEq total) by mouth daily. 30 tablet 3   prochlorperazine (COMPAZINE) 10 MG tablet Take 1 tablet (10 mg total) by mouth every 6 (six) hours as needed (Nausea or vomiting). 60 tablet 1   scopolamine (TRANSDERM-SCOP) 1 MG/3DAYS Place 1 patch (1.5 mg total) onto the skin every 3 (three) days. 10 patch 12   No current facility-administered medications for this visit.   Facility-Administered Medications Ordered in Other Visits  Medication Dose Route Frequency Provider Last Rate Last Admin   heparin lock flush 100 unit/mL  500 Units  Intracatheter Once PRN Derek Jack, MD       sodium chloride flush (NS) 0.9 % injection 10 mL  10 mL Intracatheter PRN Derek Jack, MD        ALLERGIES:  Allergies  Allergen Reactions   Penicillin G Nausea And Vomiting    PHYSICAL EXAM:  Performance status (ECOG): 1 - Symptomatic but completely ambulatory  There were no vitals filed for this visit. Wt Readings from Last 3 Encounters:  11/08/20 180 lb (81.6 kg)  11/06/20 181 lb 12.8 oz (82.5 kg)  09/04/20 171 lb 12.8 oz (77.9 kg)   Physical Exam Vitals reviewed.  Constitutional:      Appearance: Normal appearance.  HENT:     Mouth/Throat:     Palate: Lesions (sore present on soft palate L side) present.  Cardiovascular:     Rate and Rhythm: Normal rate and regular rhythm.     Pulses: Normal pulses.     Heart sounds: Normal heart sounds.  Pulmonary:     Effort: Pulmonary effort is normal.     Breath sounds: Normal breath sounds.  Musculoskeletal:     Right lower leg: No edema.     Left lower leg: No edema.  Neurological:     General: No focal deficit present.     Mental Status: He is alert and oriented to person, place, and time.  Psychiatric:        Mood and Affect: Mood normal.        Behavior: Behavior normal.     LABORATORY DATA:  I have reviewed the labs as listed.  CBC Latest Ref Rng & Units 11/05/2020 09/04/2020 08/21/2020  WBC 4.0 - 10.5 K/uL 3.9(L) 6.6 5.7  Hemoglobin 13.0 - 17.0 g/dL 9.8(L) 9.3(L) 9.2(L)  Hematocrit 39.0 - 52.0 % 30.9(L) 28.1(L) 28.0(L)  Platelets 150 - 400 K/uL 178 110(L) 112(L)   CMP Latest Ref Rng & Units 11/05/2020 09/04/2020 08/21/2020  Glucose 70 - 99 mg/dL 115(H) 92 102(H)  BUN 8 - 23 mg/dL 15 11 14   Creatinine 0.61 - 1.24 mg/dL 1.07 1.27(H) 1.39(H)  Sodium 135 - 145 mmol/L 141 136 137  Potassium 3.5 - 5.1 mmol/L 4.0 2.9(L) 3.5  Chloride 98 - 111 mmol/L 109 106 107  CO2 22 - 32 mmol/L 26 24 22   Calcium 8.9 - 10.3 mg/dL 9.1 8.7(L) 8.4(L)  Total Protein 6.5 - 8.1  g/dL 6.4(L) 5.7(L) 5.9(L)  Total Bilirubin 0.3 - 1.2 mg/dL 0.5 1.1 0.9  Alkaline Phos 38 - 126 U/L 88 107 113  AST 15 - 41 U/L 11(L) 14(L) 13(L)  ALT 0 - 44 U/L 8 11 13     DIAGNOSTIC IMAGING:  I have independently reviewed the scans and discussed with the patient. No results found.   ASSESSMENT:  1.  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 -Laparoscopic partial hepatectomy, redo low anterior resection on 02/01/2019, pathology showing YPT3Y PN 0.  Positive circumferential/radial/mesenteric margin. -6 more cycles of FOLFOX completed on 05/31/2019. -Xeloda with radiation from 06/26/2019 through 08/03/2019. -CT CAP on 09/14/2019 shows postoperative changes in the rectosigmoid resection and reanastomosis with interval increase in posttreatment perirectal and presacral soft tissue thickening consistent with radiation changes.  No adenopathy.  Multiple stable small bilateral pulmonary nodules, likely benign.  No new evidence of metastatic disease.  Unchanged splenomegaly. - PET scan on 04/08/2020 done secondary to increase in CEA shows abnormal hypermetabolic soft tissue in the presacral region suspicious for recurrence.  Sclerosis involving sacrum.  Small left axillary lymph nodes favored to be reactive. - MRI pelvis on 04/26/2020 with enhancing lesion involving S1 segment of the sacrum concerning for metastatic disease.  Peripherally enhancing soft tissue nodule within the presacral soft tissues measuring 2.4 x 2.1 cm. - PET scan on 07/01/2020 showed hypermetabolic soft tissue lesion in the presacral space shows interval progression.  Destructive changes in the sacrum also mildly progressive.  Interval development of diffuse marrow uptake, likely from growth factor. - I talked to Dr. Oneita Kras from radiation oncology at [facility].   She has recommended 2 to 3 months of chemotherapy followed by repeat pelvic MRI. - Reviewed CT chest and abdomen from 09/16/2020 which showed stable small bilateral lung nodules measuring 4 mm and smaller, most likely benign.  No evidence of metastatic disease in the chest or abdomen. - Reviewed MRI pelvis with and without contrast from 09/16/2024.  Although it is reported that there is progression of the presacral mass, this MRI was compared to MRI from 04/26/2020.  He started treatment towards the end of May.  The presacral mass has slightly improved compared to the PET scan which was done around the time of starting chemotherapy in May.  However the sacral lesion has increased in size measuring 9.9 x 5.8 cm.   PLAN:  1.  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 Oneita Kras at [facility] - He had 1 ulcer on the soft palate on the left side.  No other lesions seen. - No hand-foot skin reaction. - Reviewed labs today which showed white count 3.5 with normal ANC.  Total bilirubin is 1.3 and the rest of the LFTs are normal. - Continue Xeloda 3 tablets twice daily Monday through Friday. - He reports that XRT will conclude on 11/18/2020. - Recommend restaging CT CAP with contrast in 4 weeks.  We will also check CEA level along with routine labs at that time. - We will likely wait at least 2 to 3 months prior to repeating a pelvis MRI.   2.  Diarrhea: - He has diarrhea 2 days since last week.  At least 2 times per day. - He is taking 2 Lomotil at the onset of diarrhea which is helping.   3.  Hypomagnesemia: - Continue magnesium twice daily.  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   5.  Gout: - No recent acute gout episodes.  Not requiring allopurinol.       Orders placed this encounter:  No orders of the defined types were placed in this encounter.    Derek Jack, MD [place] [phone]   I, Thana Ates, am acting as a scribe for Dr. Derek Jack.  I, Derek Jack MD, have reviewed the above documentation for accuracy and completeness, and I agree with the above.

## 2020-11-13 ENCOUNTER — Other Ambulatory Visit: Payer: Self-pay

## 2020-11-13 ENCOUNTER — Inpatient Hospital Stay (HOSPITAL_COMMUNITY): Payer: BC Managed Care – PPO

## 2020-11-13 ENCOUNTER — Inpatient Hospital Stay (HOSPITAL_COMMUNITY): Payer: BC Managed Care – PPO | Attending: Hematology | Admitting: Hematology

## 2020-11-13 VITALS — BP 137/97 | HR 58 | Temp 96.8°F | Resp 18 | Wt 181.6 lb

## 2020-11-13 DIAGNOSIS — C187 Malignant neoplasm of sigmoid colon: Secondary | ICD-10-CM | POA: Diagnosis not present

## 2020-11-13 DIAGNOSIS — C787 Secondary malignant neoplasm of liver and intrahepatic bile duct: Secondary | ICD-10-CM | POA: Diagnosis not present

## 2020-11-13 DIAGNOSIS — M109 Gout, unspecified: Secondary | ICD-10-CM | POA: Diagnosis not present

## 2020-11-13 LAB — CBC WITH DIFFERENTIAL/PLATELET
Abs Immature Granulocytes: 0.01 10*3/uL (ref 0.00–0.07)
Basophils Absolute: 0 10*3/uL (ref 0.0–0.1)
Basophils Relative: 0 %
Eosinophils Absolute: 0.1 10*3/uL (ref 0.0–0.5)
Eosinophils Relative: 3 %
HCT: 32.3 % — ABNORMAL LOW (ref 39.0–52.0)
Hemoglobin: 10.2 g/dL — ABNORMAL LOW (ref 13.0–17.0)
Immature Granulocytes: 0 %
Lymphocytes Relative: 18 %
Lymphs Abs: 0.6 10*3/uL — ABNORMAL LOW (ref 0.7–4.0)
MCH: 29.4 pg (ref 26.0–34.0)
MCHC: 31.6 g/dL (ref 30.0–36.0)
MCV: 93.1 fL (ref 80.0–100.0)
Monocytes Absolute: 0.2 10*3/uL (ref 0.1–1.0)
Monocytes Relative: 6 %
Neutro Abs: 2.5 10*3/uL (ref 1.7–7.7)
Neutrophils Relative %: 73 %
Platelets: 157 10*3/uL (ref 150–400)
RBC: 3.47 MIL/uL — ABNORMAL LOW (ref 4.22–5.81)
RDW: 15.2 % (ref 11.5–15.5)
WBC: 3.5 10*3/uL — ABNORMAL LOW (ref 4.0–10.5)
nRBC: 0 % (ref 0.0–0.2)

## 2020-11-13 LAB — COMPREHENSIVE METABOLIC PANEL
ALT: 7 U/L (ref 0–44)
AST: 13 U/L — ABNORMAL LOW (ref 15–41)
Albumin: 3.8 g/dL (ref 3.5–5.0)
Alkaline Phosphatase: 78 U/L (ref 38–126)
Anion gap: 7 (ref 5–15)
BUN: 16 mg/dL (ref 8–23)
CO2: 27 mmol/L (ref 22–32)
Calcium: 9.1 mg/dL (ref 8.9–10.3)
Chloride: 106 mmol/L (ref 98–111)
Creatinine, Ser: 1.19 mg/dL (ref 0.61–1.24)
GFR, Estimated: >60 mL/min (ref >60)
Glucose, Bld: 117 mg/dL — ABNORMAL HIGH (ref 70–99)
Potassium: 4 mmol/L (ref 3.5–5.1)
Sodium: 140 mmol/L (ref 135–145)
Total Bilirubin: 1.3 mg/dL — ABNORMAL HIGH (ref 0.3–1.2)
Total Protein: 6.4 g/dL — ABNORMAL LOW (ref 6.5–8.1)

## 2020-11-13 NOTE — Patient Instructions (Signed)
Troy Smith at [facility] Discharge Instructions  You were seen and examined today by Dr. Delton Coombes. Return to clinic for lab work and office visit as scheduled.    Thank you for choosing [place] at [facility] to provide your oncology and hematology care.  To afford each patient quality time with our provider, please arrive at least 15 minutes before your scheduled appointment time.   If you have a lab appointment with the [facility] please come in thru the Main Entrance and check in at the main information desk.  You need to re-schedule your appointment should you arrive 10 or more minutes late.  We strive to give you quality time with our providers, and arriving late affects you and other patients whose appointments are after yours.  Also, if you no show three or more times for appointments you may be dismissed from the clinic at the providers discretion.     Again, thank you for choosing [facility].  Our hope is that these requests will decrease the amount of time that you wait before being seen by our physicians.       _____________________________________________________________  Should you have questions after your visit to [facility], please contact our office at [phone] and follow the prompts.  Our office hours are 8:00 a.m. and 4:30 p.m. Monday - Friday.  Please note that voicemails left after 4:00 p.m. may not be returned until the following business day.  We are closed weekends and major holidays.  You do have access to a nurse 24-7, just call the main number to the clinic [phone] and do not press any options, hold on the line and a nurse will answer the phone.    For prescription refill requests, have your pharmacy contact our office and allow 72 hours.    Due to Covid, you will need to wear a mask upon entering the hospital. If you do not have a mask, a mask will be given to you at the Main  Entrance upon arrival. For doctor visits, patients may have 1 support person age 57 or older with them. For treatment visits, patients can not have anyone with them due to social distancing guidelines and our immunocompromised population.

## 2020-11-14 LAB — CEA: CEA: 39.5 ng/mL — ABNORMAL HIGH (ref 0.0–4.7)

## 2020-11-20 ENCOUNTER — Other Ambulatory Visit (HOSPITAL_COMMUNITY): Payer: Self-pay

## 2020-12-13 ENCOUNTER — Ambulatory Visit (HOSPITAL_COMMUNITY)
Admission: RE | Admit: 2020-12-13 | Discharge: 2020-12-13 | Disposition: A | Discharge status: Home / Self Care | Payer: BC Managed Care – PPO | Source: Ambulatory Visit | Attending: Hematology | Admitting: Hematology

## 2020-12-13 ENCOUNTER — Inpatient Hospital Stay (HOSPITAL_COMMUNITY): Payer: BC Managed Care – PPO | Attending: Hematology

## 2020-12-13 ENCOUNTER — Other Ambulatory Visit: Payer: Self-pay

## 2020-12-13 DIAGNOSIS — C187 Malignant neoplasm of sigmoid colon: Secondary | ICD-10-CM | POA: Insufficient documentation

## 2020-12-13 DIAGNOSIS — C787 Secondary malignant neoplasm of liver and intrahepatic bile duct: Secondary | ICD-10-CM | POA: Insufficient documentation

## 2020-12-13 LAB — MAGNESIUM: Magnesium: 1.9 mg/dL (ref 1.7–2.4)

## 2020-12-13 LAB — CBC WITH DIFFERENTIAL/PLATELET
Abs Immature Granulocytes: 0.02 10*3/uL (ref 0.00–0.07)
Basophils Absolute: 0 10*3/uL (ref 0.0–0.1)
Basophils Relative: 0 %
Eosinophils Absolute: 0.2 10*3/uL (ref 0.0–0.5)
Eosinophils Relative: 4 %
HCT: 33.9 % — ABNORMAL LOW (ref 39.0–52.0)
Hemoglobin: 10.8 g/dL — ABNORMAL LOW (ref 13.0–17.0)
Immature Granulocytes: 1 %
Lymphocytes Relative: 13 %
Lymphs Abs: 0.5 10*3/uL — ABNORMAL LOW (ref 0.7–4.0)
MCH: 29.9 pg (ref 26.0–34.0)
MCHC: 31.9 g/dL (ref 30.0–36.0)
MCV: 93.9 fL (ref 80.0–100.0)
Monocytes Absolute: 0.3 10*3/uL (ref 0.1–1.0)
Monocytes Relative: 7 %
Neutro Abs: 3 10*3/uL (ref 1.7–7.7)
Neutrophils Relative %: 75 %
Platelets: 143 10*3/uL — ABNORMAL LOW (ref 150–400)
RBC: 3.61 MIL/uL — ABNORMAL LOW (ref 4.22–5.81)
RDW: 15.3 % (ref 11.5–15.5)
WBC: 3.9 10*3/uL — ABNORMAL LOW (ref 4.0–10.5)
nRBC: 0 % (ref 0.0–0.2)

## 2020-12-13 LAB — COMPREHENSIVE METABOLIC PANEL
ALT: 10 U/L (ref 0–44)
AST: 13 U/L — ABNORMAL LOW (ref 15–41)
Albumin: 3.9 g/dL (ref 3.5–5.0)
Alkaline Phosphatase: 70 U/L (ref 38–126)
Anion gap: 8 (ref 5–15)
BUN: 14 mg/dL (ref 8–23)
CO2: 29 mmol/L (ref 22–32)
Calcium: 9.7 mg/dL (ref 8.9–10.3)
Chloride: 106 mmol/L (ref 98–111)
Creatinine, Ser: 1.15 mg/dL (ref 0.61–1.24)
GFR, Estimated: >60 mL/min (ref >60)
Glucose, Bld: 137 mg/dL — ABNORMAL HIGH (ref 70–99)
Potassium: 4.5 mmol/L (ref 3.5–5.1)
Sodium: 143 mmol/L (ref 135–145)
Total Bilirubin: 1.4 mg/dL — ABNORMAL HIGH (ref 0.3–1.2)
Total Protein: 6.7 g/dL (ref 6.5–8.1)

## 2020-12-13 MED ORDER — IOHEXOL 300 MG/ML  SOLN
100.0000 mL | Freq: Once | INTRAMUSCULAR | Status: AC | PRN
  Administered 2020-12-13: 100 mL via INTRAVENOUS

## 2020-12-14 LAB — CEA: CEA: 30 ng/mL — ABNORMAL HIGH (ref 0.0–4.7)

## 2020-12-14 NOTE — Progress Notes (Signed)
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 Focal narrowing and wall thickening of the sigmoid colon is noted in this area most likely due to inflammation, but neoplasm cannot be excluded, and sigmoidoscopy is recommended for further evaluation.   03/20/2016 Procedure   Colonoscopy by Dr. Laural Smith- Malignant partially obstructing tumor in the distal sigmoid colon. Biopsied. - Incomplete exam.   03/23/2016 Pathology Results   Colon, biopsy, sigmoid mass - ADENOCARCINOMA.   03/23/2016 Procedure   Low anterior resection with transanal anastomosis   03/26/2016 Pathology Results   Colon, segmental resection for tumor, sigmoid - INVASIVE WELL DIFFERENTIATED ADENOCARCINOMA, SPANNING 5 CM IN GREATEST DIMENSION. - TUMOR INVADES THROUGH MUSCULARIS PROPRIA TO INVOLVE SUBSEROSAL SOFT TISSUES. - UNDERLYING MARKED ACUTE AND CHRONIC INFLAMMATION WITH GIANT CELL REACTION AND ABSCESS FORMATION EXTENDING TO THE SEROSAL SURFACE AND INVOLVING THE MESENTERIC TISSUE. - MARGINS ARE NEGATIVE FOR TUMOR. - FOURTEEN BENIGN LYMPH NODES WHICH DEMONSTRATE VARIABLE INVOLVEMENT WITH ACUTE AND CHRONIC INFLAMMATION WITH ASSOCIATED GIANT CELLS WITHOUT TUMOR IDENTIFIED (0/14).   04/08/2016 Smith Staging   Smith Staging Adenocarcinoma of sigmoid colon [facility]) Staging form: Colon and Rectum, AJCC 8th Edition - Pathologic stage from 04/01/2016: Stage IIA (pT3, pN0, cM0) - Signed by Troy Cancer, PA-C on 04/01/2016    07/09/2016 Imaging   CT abd/pelvis- No evidence of metastatic disease or other acute findings.  Bilateral nonobstructing renal calculi and mild bilateral renal parenchymal scarring.  Stable mildly enlarged prostate gland and findings of chronic bladder outlet obstruction.   08/16/2018 -  Chemotherapy    Patient is on Treatment Plan: COLORECTAL FOLFOXIRI          Smith STAGING: Smith Staging Adenocarcinoma of sigmoid colon [facility]) Staging form: Colon and Rectum, AJCC 8th Edition - Pathologic stage from 04/01/2016: Stage IIA (pT3, pN0, cM0) - Signed by Troy Cancer, PA-C on 04/01/2016   INTERVAL HISTORY:  [name], a 65 y.o. male, returns for routine follow-up of his metastatic colon Smith with local recurrence. [name] was last seen on 11/13/2020.   Today he reports feeling good. He reports occasional diarrhea. He denies numbness/tingling, difficulty with urination, and back pain. His appetite is good. He reports continued skin breakdown on his left outer foot which has been improving since stopping Xeloda.   REVIEW OF SYSTEMS:  Review of Systems  Constitutional:  Negative for appetite change and fatigue.  Gastrointestinal:  Positive for diarrhea.  Genitourinary:  Negative for difficulty urinating.   Musculoskeletal:  Negative for back pain.  Neurological:  Negative for numbness.  All other systems reviewed and are negative.  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[place]) 03/23/2016   Carpal tunnel syndrome 05/02/2014   Bilateral   Cervical disc disorder with radiculopathy of cervical region 05/02/2014   Left C8   Colon Smith ([place]) 06/2020   Dementia ([place]) 11/08/2020   Diabetes mellitus without complication ([place])    Hypertension    Port-A-Cath in place 08/10/2018  Past Surgical History:  Procedure Laterality Date   BIOPSY  03/20/2016   Procedure: BIOPSY;   Surgeon: Troy Houston, MD;  Location[facility];  Service: Endoscopy;;  colon   BOWEL RESECTION N/A 03/23/2016   Procedure: LOW ANTERIOR RESECTION;  Surgeon: Troy Signs, MD;  Location: [facility] ORS;  Service: General;  Laterality: N/A;   COLON SURGERY  01/2019   Done at [place] 03/20/2016   Procedure: COLONOSCOPY;  Surgeon: Troy Houston, MD;  Location: [facility] END[facility];  Service: Endoscopy;  Laterality: N/A;  730   COLONOSCOPY N/A 07/10/2016   Procedure: COLONOSCOPY;  Surgeon: Troy Houston, MD;  Location: [facility];  Service: Endoscopy;  Laterality: N/A;  1040   NO PAST SURGERIES     POLYPECTOMY  07/10/2016   Procedure: POLYPECTOMY;  Surgeon: Troy Houston, MD;  Location: [facility];  Service: Endoscopy;;  colon   PORTACATH PLACEMENT Left 08/05/2018   Procedure: INSERTION PORT-A-CATH;  Surgeon: Troy Signs, MD;  Location: [facility] ORS;  Service: General;  Laterality: Left;   TONSILLECTOMY      SOCIAL HISTORY:  Social History   Socioeconomic History   Marital status: Married    Spouse name: Not on file   Number of children: Not on file   Years of education: Not on file   Highest education level: Not on file  Occupational History   Not on file  Tobacco Use   Smoking status: Never   Smokeless tobacco: Never  Vaping Use   Vaping Use: Never used  Substance and Sexual Activity   Alcohol use: Yes    Comment: rarely   Drug use: No   Sexual activity: Not on file  Other Topics Concern   Not on file  Social History Narrative   Not on file   Social Determinants of Health   Financial Resource Strain: Not on file  Food Insecurity: Not on file  Transportation Needs: No Transportation Needs   Lack of Transportation (Medical): No   Lack of Transportation (Non-Medical): No  Physical Activity: Inactive   Days of Exercise per Week: 0 days   Minutes of Exercise per Session: 0 min  Stress: Not on file  Social Connections: Not on file  Intimate Partner  Violence: Not At Risk   Fear of Current or Ex-Partner: No   Emotionally Abused: No   Physically Abused: No   Sexually Abused: No    FAMILY HISTORY:  Family History  Problem Relation Age of Onset   COPD Mother    Melanoma Father     CURRENT MEDICATIONS:  Current Outpatient Medications  Medication Sig Dispense Refill   amLODipine (NORVASC) 10 MG tablet Take 10 mg by mouth daily.     capecitabine (XELODA) 500 MG tablet Take 3 tablets (1,500 mg total) by mouth 2 (two) times daily after a meal. Take Monday through Friday. Take only on days of radiation. 168 tablet 0   dexamethasone (DECADRON) 4 MG tablet Take 1 tablet (4 mg total) by mouth daily. Take in the morning with food 30 tablet 0   diphenoxylate-atropine (LOMOTIL) 2.5-0.025 MG tablet TAKE 2 AFTER FIRST ONSET OF DIARRHEA OR LOOSE STOOL, THEN 1 AFTER EACH LOOSE STOOL. 60 tablet 0   donepezil (ARICEPT) 10 MG tablet TAKE (1) TABLET BY MOUTH AT BEDTIME. 90 tablet 3   insulin glargine (LANTUS) 100 UNIT/ML injection Inject 30 Units into the skin at bedtime.      loperamide (IMODIUM A-D) 2  MG tablet Take 2 at onset of diarrhea, then 1 after each watery bowel movement. 100 tablet 1   megestrol (MEGACE) 400 MG/10ML suspension 2 teaspoons in the morning and 2 teaspoons in the evening 240 mL 0   memantine (NAMENDA) 10 MG tablet Take 1 tablet (10 mg total) by mouth 2 (two) times daily. 180 tablet 3   metoprolol succinate (TOPROL-XL) 50 MG 24 hr tablet Take 50 mg by mouth daily. Take with or immediately following a meal.     potassium chloride SA (KLOR-CON) 20 MEQ tablet Take 1 tablet (20 mEq total) by mouth daily. 30 tablet 3   prochlorperazine (COMPAZINE) 10 MG tablet Take 1 tablet (10 mg total) by mouth every 6 (six) hours as needed (Nausea or vomiting). (Patient not taking: Reported on 11/13/2020) 60 tablet 1   scopolamine (TRANSDERM-SCOP) 1 MG/3DAYS Place 1 patch (1.5 mg total) onto the skin every 3 (three) days. (Patient not taking: Reported  on 11/13/2020) 10 patch 12   No current facility-administered medications for this visit.   Facility-Administered Medications Ordered in Other Visits  Medication Dose Route Frequency Provider Last Rate Last Admin   heparin lock flush 100 unit/mL  500 Units Intracatheter Once PRN Derek Jack, MD       sodium chloride flush (NS) 0.9 % injection 10 mL  10 mL Intracatheter PRN Derek Jack, MD        ALLERGIES:  Allergies  Allergen Reactions   Penicillin G Nausea And Vomiting    PHYSICAL EXAM:  Performance status (ECOG): 1 - Symptomatic but completely ambulatory  There were no vitals filed for this visit. Wt Readings from Last 3 Encounters:  11/13/20 181 lb 9.6 oz (82.4 kg)  11/08/20 180 lb (81.6 kg)  11/06/20 181 lb 12.8 oz (82.5 kg)   Physical Exam Vitals reviewed.  Constitutional:      Appearance: Normal appearance.  Cardiovascular:     Rate and Rhythm: Normal rate and regular rhythm.     Pulses: Normal pulses.     Heart sounds: Normal heart sounds.  Pulmonary:     Effort: Pulmonary effort is normal.     Breath sounds: Normal breath sounds.  Skin:    Comments: Skin breakdown L outer foot  Neurological:     General: No focal deficit present.     Mental Status: He is alert and oriented to person, place, and time.  Psychiatric:        Mood and Affect: Mood normal.        Behavior: Behavior normal.     LABORATORY DATA:  I have reviewed the labs as listed.  CBC Latest Ref Rng & Units 12/13/2020 11/13/2020 11/05/2020  WBC 4.0 - 10.5 K/uL 3.9(L) 3.5(L) 3.9(L)  Hemoglobin 13.0 - 17.0 g/dL 10.8(L) 10.2(L) 9.8(L)  Hematocrit 39.0 - 52.0 % 33.9(L) 32.3(L) 30.9(L)  Platelets 150 - 400 K/uL 143(L) 157 178   CMP Latest Ref Rng & Units 12/13/2020 11/13/2020 11/05/2020  Glucose 70 - 99 mg/dL 137(H) 117(H) 115(H)  BUN 8 - 23 mg/dL 14 16 15   Creatinine 0.61 - 1.24 mg/dL 1.15 1.19 1.07  Sodium 135 - 145 mmol/L 143 140 141  Potassium 3.5 - 5.1 mmol/L 4.5 4.0 4.0   Chloride 98 - 111 mmol/L 106 106 109  CO2 22 - 32 mmol/L 29 27 26   Calcium 8.9 - 10.3 mg/dL 9.7 9.1 9.1  Total Protein 6.5 - 8.1 g/dL 6.7 6.4(L) 6.4(L)  Total Bilirubin 0.3 - 1.2 mg/dL 1.4(H) 1.3(H) 0.5  Alkaline Phos 38 - 126 U/L 70 78 88  AST 15 - 41 U/L 13(L) 13(L) 11(L)  ALT 0 - 44 U/L 10 7 8     DIAGNOSTIC IMAGING:  I have independently reviewed the scans and discussed with the patient. No results found.   ASSESSMENT:  1.  Metastatic sigmoid colon Smith to the liver: -6 cycles of FOLFOX with bevacizumab completed on 11/30/2018. 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 -Laparoscopic partial hepatectomy, redo low anterior resection on 02/01/2019, pathology showing YPT3Y PN 0.  Positive circumferential/radial/mesenteric margin. -6 more cycles of FOLFOX completed on 05/31/2019. -Xeloda with radiation from 06/26/2019 through 08/03/2019. -CT CAP on 09/14/2019 shows postoperative changes in the rectosigmoid resection and reanastomosis with interval increase in posttreatment perirectal and presacral soft tissue thickening consistent with radiation changes.  No adenopathy.  Multiple stable small bilateral pulmonary nodules, likely benign.  No new evidence of metastatic disease.  Unchanged splenomegaly. - PET scan on 04/08/2020 done secondary to increase in CEA shows abnormal hypermetabolic soft tissue in the presacral region suspicious for recurrence.  Sclerosis involving sacrum.  Small left axillary lymph nodes favored to be reactive. - MRI pelvis on 04/26/2020 with enhancing lesion involving S1 segment of the sacrum concerning for metastatic disease.  Peripherally enhancing soft tissue nodule within the presacral soft tissues measuring 2.4 x 2.1 cm. - PET scan on 07/01/2020 showed hypermetabolic soft tissue lesion in the presacral space shows interval progression.  Destructive changes in the sacrum also mildly progressive.   Interval development of diffuse marrow uptake, likely from growth factor. - I talked to Dr. Oneita Kras from radiation oncology at [facility].  She has recommended 2 to 3 months of chemotherapy followed by repeat pelvic MRI. - Reviewed CT chest and abdomen from 09/16/2020 which showed stable small bilateral lung nodules measuring 4 mm and smaller, most likely benign.  No evidence of metastatic disease in the chest or abdomen. - Reviewed MRI pelvis with and without contrast from 09/16/2024.  Although it is reported that there is progression of the presacral mass, this MRI was compared to MRI from 04/26/2020.  He started treatment towards the end of May.  The presacral mass has slightly improved compared to the PET scan which was done around the time of starting chemotherapy in May.  However the sacral lesion has increased in size measuring 9.9 x 5.8 cm. - XRT to the pelvis from 10/28/2020 through 11/18/2020 with Xeloda.   PLAN:  1.  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 - Interval progression of the lytic lesion in the sacrum was noted when the scan was compared to prior PET scan from 07/01/2020.  Markedly enlarged prostate gland was also discussed with him.  He does not have any prostatism symptoms. - CEA has slightly improved to 30 from 39.5. - He does not report any pains.  Labs reviewed showed mild leukopenia and improving hemoglobin.  LFTs are grossly normal. - He still has some breakdown of the left foot lateral skin breakdown from Xeloda which is improving. - He has a pelvic MRI and follow-up with Dr. Alfonso Ramus on 01/24/2021. - I plan to repeat CT CAP and CEA in 3 months.   2.  Diarrhea: - Continue Lomotil as needed.   3.  Hypomagnesemia: - Continue magnesium twice daily.   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   5.  Gout: -  No recent acute gout episodes.  Not taking allopurinol.   Orders placed this encounter:  No orders of  the defined types were placed in this encounter.    Derek Jack, MD [place] [phone]   I, Thana Ates, am acting as a scribe for Dr. Derek Jack.  I, Derek Jack MD, have reviewed the above documentation for accuracy and completeness, and I agree with the above.

## 2020-12-16 ENCOUNTER — Other Ambulatory Visit (HOSPITAL_COMMUNITY): Payer: Self-pay

## 2020-12-16 ENCOUNTER — Other Ambulatory Visit: Payer: Self-pay

## 2020-12-16 ENCOUNTER — Inpatient Hospital Stay (HOSPITAL_COMMUNITY): Payer: BC Managed Care – PPO | Admitting: Hematology

## 2020-12-16 VITALS — BP 132/78 | HR 83 | Temp 98.1°F | Resp 18 | Wt 185.0 lb

## 2020-12-16 DIAGNOSIS — C187 Malignant neoplasm of sigmoid colon: Secondary | ICD-10-CM

## 2020-12-16 DIAGNOSIS — C787 Secondary malignant neoplasm of liver and intrahepatic bile duct: Secondary | ICD-10-CM | POA: Diagnosis not present

## 2020-12-16 NOTE — Patient Instructions (Signed)
[place] at [facility] Discharge Instructions  You were seen and examined today by Dr. Delton Coombes. He reviewed your most recent labs and scans and everything looks good. Please follow up in 3 months as scheduled.   Thank you for choosing [place] at [facility] to provide your oncology and hematology care.  To afford each patient quality time with our provider, please arrive at least 15 minutes before your scheduled appointment time.   If you have a lab appointment with the [place] please come in thru the Main Entrance and check in at the main information desk.  You need to re-schedule your appointment should you arrive 10 or more minutes late.  We strive to give you quality time with our providers, and arriving late affects you and other patients whose appointments are after yours.  Also, if you no show three or more times for appointments you may be dismissed from the clinic at the providers discretion.     Again, thank you for choosing [facility].  Our hope is that these requests will decrease the amount of time that you wait before being seen by our physicians.       _____________________________________________________________  Should you have questions after your visit to [facility], please contact our office at [phone] and follow the prompts.  Our office hours are 8:00 a.m. and 4:30 p.m. Monday - Friday.  Please note that voicemails left after 4:00 p.m. may not be returned until the following business day.  We are closed weekends and major holidays.  You do have access to a nurse 24-7, just call the main number to the clinic [phone] and do not press any options, hold on the line and a nurse will answer the phone.    For prescription refill requests, have your pharmacy contact our office and allow 72 hours.    Due to Covid, you will need to wear a mask upon entering the hospital. If you do not  have a mask, a mask will be given to you at the Main Entrance upon arrival. For doctor visits, patients may have 1 support person age 74 or older with them. For treatment visits, patients can not have anyone with them due to social distancing guidelines and our immunocompromised population.

## 2020-12-23 ENCOUNTER — Other Ambulatory Visit: Payer: Self-pay

## 2020-12-23 ENCOUNTER — Inpatient Hospital Stay (HOSPITAL_COMMUNITY): Payer: BC Managed Care – PPO

## 2020-12-23 VITALS — BP 136/84 | HR 64 | Temp 98.0°F | Resp 18

## 2020-12-23 DIAGNOSIS — Z95828 Presence of other vascular implants and grafts: Secondary | ICD-10-CM

## 2020-12-23 DIAGNOSIS — C187 Malignant neoplasm of sigmoid colon: Secondary | ICD-10-CM

## 2020-12-23 MED ORDER — SODIUM CHLORIDE 0.9% FLUSH
10.0000 mL | INTRAVENOUS | Status: DC | PRN
  Administered 2020-12-23: 10 mL via INTRAVENOUS

## 2020-12-23 MED ORDER — HEPARIN SOD (PORK) LOCK FLUSH 100 UNIT/ML IV SOLN
500.0000 [IU] | Freq: Once | INTRAVENOUS | Status: AC
  Administered 2020-12-23: 500 [IU] via INTRAVENOUS

## 2020-12-23 NOTE — Patient Instructions (Signed)
[facility]  Discharge Instructions: Thank you for choosing [facility] to provide your oncology and hematology care.  If you have a lab appointment with the Cancer Center, please come in thru the Main Entrance and check in at the main information desk.  Wear comfortable clothing and clothing appropriate for easy access to any Portacath or PICC line.   We strive to give you quality time with your provider. You may need to reschedule your appointment if you arrive late (15 or more minutes).  Arriving late affects you and other patients whose appointments are after yours.  Also, if you miss three or more appointments without notifying the office, you may be dismissed from the clinic at the provider's discretion.      For prescription refill requests, have your pharmacy contact our office and allow 72 hours for refills to be completed.        To help prevent nausea and vomiting after your treatment, we encourage you to take your nausea medication as directed.  BELOW ARE SYMPTOMS THAT SHOULD BE REPORTED IMMEDIATELY: *FEVER GREATER THAN 100.4 F (38 C) OR HIGHER *CHILLS OR SWEATING *NAUSEA AND VOMITING THAT IS NOT CONTROLLED WITH YOUR NAUSEA MEDICATION *UNUSUAL SHORTNESS OF BREATH *UNUSUAL BRUISING OR BLEEDING *URINARY PROBLEMS (pain or burning when urinating, or frequent urination) *BOWEL PROBLEMS (unusual diarrhea, constipation, pain near the anus) TENDERNESS IN MOUTH AND THROAT WITH OR WITHOUT PRESENCE OF ULCERS (sore throat, sores in mouth, or a toothache) UNUSUAL RASH, SWELLING OR PAIN  UNUSUAL VAGINAL DISCHARGE OR ITCHING   Items with * indicate a potential emergency and should be followed up as soon as possible or go to the Emergency Department if any problems should occur.  Please show the CHEMOTHERAPY ALERT CARD or IMMUNOTHERAPY ALERT CARD at check-in to the Emergency Department and triage nurse.  Should you have questions after your visit or need to cancel  or reschedule your appointment, please contact [facility] [phone]  and follow the prompts.  Office hours are 8:00 a.m. to 4:30 p.m. Monday - Friday. Please note that voicemails left after 4:00 p.m. may not be returned until the following business day.  We are closed weekends and major holidays. You have access to a nurse at all times for urgent questions. Please call the main number to the clinic [phone] and follow the prompts.  For any non-urgent questions, you may also contact your provider using MyChart. We now offer e-Visits for anyone 18 and older to request care online for non-urgent symptoms. For details visit [email or web address].   Also download the MyChart app! Go to the app store, search "MyChart", open the app, select [facility], and log in with your MyChart username and password.  Due to Covid, a mask is required upon entering the hospital/clinic. If you do not have a mask, one will be given to you upon arrival. For doctor visits, patients may have 1 support person aged 18 or older with them. For treatment visits, patients cannot have anyone with them due to current Covid guidelines and our immunocompromised population.  

## 2020-12-23 NOTE — Progress Notes (Signed)
Patients port flushed without difficulty.  Good blood return noted with no bruising or swelling noted at site.  Band aid applied.  VSS with discharge and left in satisfactory condition with no s/s of distress noted.   

## 2021-02-13 ENCOUNTER — Ambulatory Visit (INDEPENDENT_AMBULATORY_CARE_PROVIDER_SITE_OTHER): Payer: BC Managed Care – PPO | Admitting: Podiatry

## 2021-02-13 ENCOUNTER — Ambulatory Visit: Payer: BC Managed Care – PPO | Admitting: Podiatry

## 2021-02-13 ENCOUNTER — Encounter: Payer: Self-pay | Admitting: Podiatry

## 2021-02-13 ENCOUNTER — Other Ambulatory Visit: Payer: Self-pay

## 2021-02-13 DIAGNOSIS — L603 Nail dystrophy: Secondary | ICD-10-CM | POA: Diagnosis not present

## 2021-02-13 DIAGNOSIS — L853 Xerosis cutis: Secondary | ICD-10-CM | POA: Diagnosis not present

## 2021-02-13 DIAGNOSIS — L84 Corns and callosities: Secondary | ICD-10-CM

## 2021-02-17 ENCOUNTER — Encounter: Payer: Self-pay | Admitting: Podiatry

## 2021-02-17 NOTE — Progress Notes (Signed)
°  Subjective:  Patient ID: Troy Smith, male    DOB: 06-20-1955,  MRN: [id number]  Chief Complaint  Patient presents with   Tinea Pedis    np-Cracked and peeling skin on feet.    66 y.o. male presents with the above complaint. History confirmed with patient.  Here with his wife today.  He is a retired Pharmacist, community.  Started getting dry cracking skin after starting Xeloda for colon cancer.  He also has thickening of the nails  Objective:  Physical Exam: warm, good capillary refill, no trophic changes or ulcerative lesions, normal DP and PT pulses, normal sensory exam, and dry cracking skin on the tops of both feet. Callus, submetatarsal 5 as well as Dystrophy and thickening of the nails c Assessment:   1. Nail dystrophy   2. Callus of foot   3. Xerosis cutis      Plan:  Patient was evaluated and treated and all questions answered.  Unclear if the thickening the nails is fungal or dystrophy.  Sample of the nail was taken.  This will be sent to [facility] pathology and we will analyze this.  We will let him know treatment options after I get the results back.  Callus was debrided and I discussed offloading this with a dancers pad treated today.  The dry skin seems to be improving since he has stopped the medication and I recommended continue to moisturize it.  Also discussed using the urea cream which should help with the callus and the dry skin to exfoliate it.  Could consider using topical corticosteroids if not improving   Return if symptoms worsen or fail to improve.

## 2021-03-17 ENCOUNTER — Encounter (HOSPITAL_COMMUNITY): Payer: Self-pay

## 2021-03-17 ENCOUNTER — Inpatient Hospital Stay (HOSPITAL_COMMUNITY): Payer: BC Managed Care – PPO | Attending: Hematology

## 2021-03-17 ENCOUNTER — Other Ambulatory Visit: Payer: Self-pay

## 2021-03-17 DIAGNOSIS — Z79899 Other long term (current) drug therapy: Secondary | ICD-10-CM | POA: Diagnosis not present

## 2021-03-17 DIAGNOSIS — C787 Secondary malignant neoplasm of liver and intrahepatic bile duct: Secondary | ICD-10-CM | POA: Diagnosis not present

## 2021-03-17 DIAGNOSIS — C187 Malignant neoplasm of sigmoid colon: Secondary | ICD-10-CM | POA: Diagnosis present

## 2021-03-17 DIAGNOSIS — C7951 Secondary malignant neoplasm of bone: Secondary | ICD-10-CM | POA: Insufficient documentation

## 2021-03-17 LAB — COMPREHENSIVE METABOLIC PANEL
ALT: 9 U/L (ref 0–44)
AST: 13 U/L — ABNORMAL LOW (ref 15–41)
Albumin: 3.6 g/dL (ref 3.5–5.0)
Alkaline Phosphatase: 83 U/L (ref 38–126)
Anion gap: 8 (ref 5–15)
BUN: 21 mg/dL (ref 8–23)
CO2: 24 mmol/L (ref 22–32)
Calcium: 9.1 mg/dL (ref 8.9–10.3)
Chloride: 107 mmol/L (ref 98–111)
Creatinine, Ser: 1.32 mg/dL — ABNORMAL HIGH (ref 0.61–1.24)
GFR, Estimated: >60 mL/min (ref >60)
Glucose, Bld: 121 mg/dL — ABNORMAL HIGH (ref 70–99)
Potassium: 3.9 mmol/L (ref 3.5–5.1)
Sodium: 139 mmol/L (ref 135–145)
Total Bilirubin: 0.8 mg/dL (ref 0.3–1.2)
Total Protein: 6.4 g/dL — ABNORMAL LOW (ref 6.5–8.1)

## 2021-03-17 LAB — CBC WITH DIFFERENTIAL/PLATELET
Abs Immature Granulocytes: 0.01 10*3/uL (ref 0.00–0.07)
Basophils Absolute: 0 10*3/uL (ref 0.0–0.1)
Basophils Relative: 0 %
Eosinophils Absolute: 0.3 10*3/uL (ref 0.0–0.5)
Eosinophils Relative: 7 %
HCT: 29.7 % — ABNORMAL LOW (ref 39.0–52.0)
Hemoglobin: 9.4 g/dL — ABNORMAL LOW (ref 13.0–17.0)
Immature Granulocytes: 0 %
Lymphocytes Relative: 12 %
Lymphs Abs: 0.6 10*3/uL — ABNORMAL LOW (ref 0.7–4.0)
MCH: 28.3 pg (ref 26.0–34.0)
MCHC: 31.6 g/dL (ref 30.0–36.0)
MCV: 89.5 fL (ref 80.0–100.0)
Monocytes Absolute: 0.3 10*3/uL (ref 0.1–1.0)
Monocytes Relative: 5 %
Neutro Abs: 3.7 10*3/uL (ref 1.7–7.7)
Neutrophils Relative %: 76 %
Platelets: 171 10*3/uL (ref 150–400)
RBC: 3.32 MIL/uL — ABNORMAL LOW (ref 4.22–5.81)
RDW: 15.1 % (ref 11.5–15.5)
WBC: 4.9 10*3/uL (ref 4.0–10.5)
nRBC: 0 % (ref 0.0–0.2)

## 2021-03-17 MED ORDER — HEPARIN SOD (PORK) LOCK FLUSH 100 UNIT/ML IV SOLN
500.0000 [IU] | Freq: Once | INTRAVENOUS | Status: AC
  Administered 2021-03-17: 500 [IU] via INTRAVENOUS

## 2021-03-17 MED ORDER — SODIUM CHLORIDE 0.9% FLUSH
10.0000 mL | Freq: Once | INTRAVENOUS | Status: AC
  Administered 2021-03-17: 10 mL via INTRAVENOUS

## 2021-03-17 NOTE — Patient Instructions (Signed)
[facility]  Discharge Instructions: Thank you for choosing [facility] to provide your oncology and hematology care.  If you have a lab appointment with the Cancer Center, please come in thru the Main Entrance and check in at the main information desk.  Wear comfortable clothing and clothing appropriate for easy access to any Portacath or PICC line.   We strive to give you quality time with your provider. You may need to reschedule your appointment if you arrive late (15 or more minutes).  Arriving late affects you and other patients whose appointments are after yours.  Also, if you miss three or more appointments without notifying the office, you may be dismissed from the clinic at the provider's discretion.      For prescription refill requests, have your pharmacy contact our office and allow 72 hours for refills to be completed.        To help prevent nausea and vomiting after your treatment, we encourage you to take your nausea medication as directed.  BELOW ARE SYMPTOMS THAT SHOULD BE REPORTED IMMEDIATELY: *FEVER GREATER THAN 100.4 F (38 C) OR HIGHER *CHILLS OR SWEATING *NAUSEA AND VOMITING THAT IS NOT CONTROLLED WITH YOUR NAUSEA MEDICATION *UNUSUAL SHORTNESS OF BREATH *UNUSUAL BRUISING OR BLEEDING *URINARY PROBLEMS (pain or burning when urinating, or frequent urination) *BOWEL PROBLEMS (unusual diarrhea, constipation, pain near the anus) TENDERNESS IN MOUTH AND THROAT WITH OR WITHOUT PRESENCE OF ULCERS (sore throat, sores in mouth, or a toothache) UNUSUAL RASH, SWELLING OR PAIN  UNUSUAL VAGINAL DISCHARGE OR ITCHING   Items with * indicate a potential emergency and should be followed up as soon as possible or go to the Emergency Department if any problems should occur.  Please show the CHEMOTHERAPY ALERT CARD or IMMUNOTHERAPY ALERT CARD at check-in to the Emergency Department and triage nurse.  Should you have questions after your visit or need to cancel  or reschedule your appointment, please contact [facility] [phone]  and follow the prompts.  Office hours are 8:00 a.m. to 4:30 p.m. Monday - Friday. Please note that voicemails left after 4:00 p.m. may not be returned until the following business day.  We are closed weekends and major holidays. You have access to a nurse at all times for urgent questions. Please call the main number to the clinic [phone] and follow the prompts.  For any non-urgent questions, you may also contact your provider using MyChart. We now offer e-Visits for anyone 18 and older to request care online for non-urgent symptoms. For details visit [email or web address].   Also download the MyChart app! Go to the app store, search "MyChart", open the app, select [facility], and log in with your MyChart username and password.  Due to Covid, a mask is required upon entering the hospital/clinic. If you do not have a mask, one will be given to you upon arrival. For doctor visits, patients may have 1 support person aged 18 or older with them. For treatment visits, patients cannot have anyone with them due to current Covid guidelines and our immunocompromised population.  

## 2021-03-17 NOTE — Progress Notes (Signed)
Labs drawn from port.  Patients port flushed without difficulty.  Good blood return noted with no bruising or swelling noted at site.  Band aid applied.  VSS with discharge and left in satisfactory condition with no s/s of distress noted.  

## 2021-03-18 LAB — CEA: CEA: 133 ng/mL — ABNORMAL HIGH (ref 0.0–4.7)

## 2021-03-19 ENCOUNTER — Other Ambulatory Visit: Payer: Self-pay

## 2021-03-19 ENCOUNTER — Ambulatory Visit (HOSPITAL_COMMUNITY)
Admission: RE | Admit: 2021-03-19 | Discharge: 2021-03-19 | Disposition: A | Discharge status: Home / Self Care | Payer: BC Managed Care – PPO | Source: Ambulatory Visit | Attending: Hematology | Admitting: Hematology

## 2021-03-19 DIAGNOSIS — C187 Malignant neoplasm of sigmoid colon: Secondary | ICD-10-CM | POA: Insufficient documentation

## 2021-03-19 MED ORDER — IOHEXOL 300 MG/ML  SOLN
100.0000 mL | Freq: Once | INTRAMUSCULAR | Status: AC | PRN
  Administered 2021-03-19: 100 mL via INTRAVENOUS

## 2021-03-26 ENCOUNTER — Other Ambulatory Visit: Payer: Self-pay

## 2021-03-26 ENCOUNTER — Encounter (HOSPITAL_COMMUNITY): Payer: Self-pay | Admitting: Hematology

## 2021-03-26 ENCOUNTER — Inpatient Hospital Stay (HOSPITAL_BASED_OUTPATIENT_CLINIC_OR_DEPARTMENT_OTHER): Payer: BC Managed Care – PPO | Admitting: Hematology

## 2021-03-26 VITALS — BP 151/78 | HR 63 | Temp 98.0°F | Resp 18 | Ht 68.0 in | Wt 195.1 lb

## 2021-03-26 DIAGNOSIS — C187 Malignant neoplasm of sigmoid colon: Secondary | ICD-10-CM

## 2021-03-26 NOTE — Progress Notes (Signed)
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 Focal narrowing and wall thickening of the sigmoid colon is noted in this area most likely due to inflammation, but neoplasm cannot be excluded, and sigmoidoscopy is recommended for further evaluation.   03/20/2016 Procedure   Colonoscopy by Dr. Laural Golden- Malignant partially obstructing tumor in the distal sigmoid colon. Biopsied. - Incomplete exam.   03/23/2016 Pathology Results   Colon, biopsy, sigmoid mass - ADENOCARCINOMA.   03/23/2016 Procedure   Low anterior resection with transanal anastomosis   03/26/2016 Pathology Results   Colon, segmental resection for tumor, sigmoid - INVASIVE WELL DIFFERENTIATED ADENOCARCINOMA, SPANNING 5 CM IN GREATEST DIMENSION. - TUMOR INVADES THROUGH MUSCULARIS PROPRIA TO INVOLVE SUBSEROSAL SOFT TISSUES. - UNDERLYING MARKED ACUTE AND CHRONIC INFLAMMATION WITH GIANT CELL REACTION AND ABSCESS FORMATION EXTENDING TO THE SEROSAL SURFACE AND INVOLVING THE MESENTERIC TISSUE. - MARGINS ARE NEGATIVE FOR TUMOR. - FOURTEEN BENIGN LYMPH NODES WHICH DEMONSTRATE VARIABLE INVOLVEMENT WITH ACUTE AND CHRONIC INFLAMMATION WITH ASSOCIATED GIANT CELLS WITHOUT TUMOR IDENTIFIED (0/14).   04/08/2016 Cancer Staging   Cancer Staging Adenocarcinoma of sigmoid colon [facility]) Staging form: Colon and Rectum, AJCC 8th Edition - Pathologic stage from 04/01/2016: Stage IIA (pT3, pN0, cM0) - Signed by Baird Cancer,  PA-C on 04/01/2016    07/09/2016 Imaging   CT abd/pelvis- No evidence of metastatic disease or other acute findings.  Bilateral nonobstructing renal calculi and mild bilateral renal parenchymal scarring.  Stable mildly enlarged prostate gland and findings of chronic bladder outlet obstruction.   08/16/2018 -  Chemotherapy    Patient is on Treatment Plan: COLORECTAL FOLFOXIRI          CANCER STAGING: Cancer Staging  Adenocarcinoma of sigmoid colon [facility]) Staging form: Colon and Rectum, AJCC 8th Edition - Pathologic stage from 04/01/2016: Stage IIA (pT3, pN0, cM0) - Signed by Baird Cancer, PA-C on 04/01/2016   INTERVAL HISTORY:  [name], a 66 y.o. male, returns for routine follow-up of his metastatic colon cancer with local recurrence. Troy Smith was last seen on 12/16/2020.   Today he reports feeling good. He report occasional diarrhea which has improved with Imodium. He has gained 10 lbs since his last visit. He denies left-sided sacral pain. He denies difficulty urinating. He denies nausea, vomiting, and abdominal pain. He denies/tingling numbness.   REVIEW OF SYSTEMS:  Review of Systems  Constitutional:  Negative for appetite change, fatigue and unexpected weight change (+10 lbs).  Gastrointestinal:  Positive for diarrhea (occasional). Negative for abdominal pain, nausea and vomiting.  Genitourinary:  Negative for difficulty urinating and pelvic pain.   Neurological:  Negative for numbness.  All other systems reviewed and are negative.  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[place]) 03/23/2016   Carpal tunnel syndrome 05/02/2014   Bilateral   Cervical disc disorder with radiculopathy of cervical region 05/02/2014   Left C8   Colon cancer ([place]) 06/2020   Dementia ([place]) 11/08/2020   Diabetes mellitus without complication ([place])  Hypertension    Port-A-Cath in place 08/10/2018   Past Surgical  History:  Procedure Laterality Date   BIOPSY  03/20/2016   Procedure: BIOPSY;  Surgeon: Rogene Houston, MD;  Location: [facility] ENDO [facility];  Service: Endoscopy;;  colon   BOWEL RESECTION N/A 03/23/2016   Procedure: LOW ANTERIOR RESECTION;  Surgeon: Aviva Signs, MD;  Location: [facility] ORS;  Service: General;  Laterality: N/A;   COLON SURGERY  01/2019   Done at [place] 03/20/2016   Procedure: COLONOSCOPY;  Surgeon: Rogene Houston, MD;  Location: [facility] ENDO SUITE;  Service: Endoscopy;  Laterality: N/A;  730   COLONOSCOPY N/A 07/10/2016   Procedure: COLONOSCOPY;  Surgeon: Rogene Houston, MD;  Location: [facility] END[facility] SU[facility];  Service: Endoscopy;  Laterality: N/A;  1040   NO PAST SURGERIES     POLYPECTOMY  07/10/2016   Procedure: POLYPECTOMY;  Surgeon: Rogene Houston, MD;  Location: [facility];  Service: Endoscopy;;  colon   PORTACATH PLACEMENT Left 08/05/2018   Procedure: INSERTION PORT-A-CATH;  Surgeon: Aviva Signs, MD;  Location: [facility] ORS;  Service: General;  Laterality: Left;   TONSILLECTOMY      SOCIAL HISTORY:  Social History   Socioeconomic History   Marital status: Married    Spouse name: Not on file   Number of children: Not on file   Years of education: Not on file   Highest education level: Not on file  Occupational History   Not on file  Tobacco Use   Smoking status: Never   Smokeless tobacco: Never  Vaping Use   Vaping Use: Never used  Substance and Sexual Activity   Alcohol use: Yes    Comment: rarely   Drug use: No   Sexual activity: Not on file  Other Topics Concern   Not on file  Social History Narrative   Not on file   Social Determinants of Health   Financial Resource Strain: Not on file  Food Insecurity: Not on file  Transportation Needs: No Transportation Needs   Lack of Transportation (Medical): No   Lack of Transportation (Non-Medical): No  Physical Activity: Inactive   Days of Exercise per Week: 0 days   Minutes of Exercise per Session: 0 min   Stress: Not on file  Social Connections: Not on file  Intimate Partner Violence: Not At Risk   Fear of Current or Ex-Partner: No   Emotionally Abused: No   Physically Abused: No   Sexually Abused: No    FAMILY HISTORY:  Family History  Problem Relation Age of Onset   COPD Mother    Melanoma Father     CURRENT MEDICATIONS:  Current Outpatient Medications  Medication Sig Dispense Refill   amLODipine (NORVASC) 10 MG tablet Take 10 mg by mouth daily.     capecitabine (XELODA) 500 MG tablet Take 3 tablets (1,500 mg total) by mouth 2 (two) times daily after a meal. Take Monday through Friday. Take only on days of radiation. 168 tablet 0   dexamethasone (DECADRON) 4 MG tablet Take 1 tablet (4 mg total) by mouth daily. Take in the morning with food 30 tablet 0   diphenoxylate-atropine (LOMOTIL) 2.5-0.025 MG tablet TAKE 2 AFTER FIRST ONSET OF DIARRHEA OR LOOSE STOOL, THEN 1 AFTER EACH LOOSE STOOL. 60 tablet 0   donepezil (ARICEPT) 10 MG tablet TAKE (1) TABLET BY MOUTH AT BEDTIME. 90 tablet 3   insulin glargine (LANTUS) 100 UNIT/ML injection Inject 30 Units into the skin at  bedtime.      loperamide (IMODIUM A-D) 2 MG tablet Take 2 at onset of diarrhea, then 1 after each watery bowel movement. 100 tablet 1   megestrol (MEGACE) 400 MG/10ML suspension 2 teaspoons in the morning and 2 teaspoons in the evening 240 mL 0   memantine (NAMENDA) 10 MG tablet Take 1 tablet (10 mg total) by mouth 2 (two) times daily. 180 tablet 3   metoprolol succinate (TOPROL-XL) 50 MG 24 hr tablet Take 50 mg by mouth daily. Take with or immediately following a meal.     potassium chloride SA (KLOR-CON) 20 MEQ tablet Take 1 tablet (20 mEq total) by mouth daily. 30 tablet 3   prochlorperazine (COMPAZINE) 10 MG tablet Take 1 tablet (10 mg total) by mouth every 6 (six) hours as needed (Nausea or vomiting). 60 tablet 1   scopolamine (TRANSDERM-SCOP) 1 MG/3DAYS Place 1 patch (1.5 mg total) onto the skin every 3 (three) days.  10 patch 12   No current facility-administered medications for this visit.   Facility-Administered Medications Ordered in Other Visits  Medication Dose Route Frequency Provider Last Rate Last Admin   heparin lock flush 100 unit/mL  500 Units Intracatheter Once PRN Derek Jack, MD       sodium chloride flush (NS) 0.9 % injection 10 mL  10 mL Intracatheter PRN Derek Jack, MD        ALLERGIES:  Allergies  Allergen Reactions   Penicillin G Nausea And Vomiting    PHYSICAL EXAM:  Performance status (ECOG): 1 - Symptomatic but completely ambulatory  There were no vitals filed for this visit. Wt Readings from Last 3 Encounters:  12/16/20 185 lb (83.9 kg)  11/13/20 181 lb 9.6 oz (82.4 kg)  11/08/20 180 lb (81.6 kg)   Physical Exam Vitals reviewed.  Constitutional:      Appearance: Normal appearance.  Cardiovascular:     Rate and Rhythm: Normal rate and regular rhythm.     Pulses: Normal pulses.     Heart sounds: Normal heart sounds.  Pulmonary:     Effort: Pulmonary effort is normal.     Breath sounds: Normal breath sounds.  Neurological:     General: No focal deficit present.     Mental Status: He is alert and oriented to person, place, and time.  Psychiatric:        Mood and Affect: Mood normal.        Behavior: Behavior normal.     LABORATORY DATA:  I have reviewed the labs as listed.  CBC Latest Ref Rng & Units 03/17/2021 12/13/2020 11/13/2020  WBC 4.0 - 10.5 K/uL 4.9 3.9(L) 3.5(L)  Hemoglobin 13.0 - 17.0 g/dL 9.4(L) 10.8(L) 10.2(L)  Hematocrit 39.0 - 52.0 % 29.7(L) 33.9(L) 32.3(L)  Platelets 150 - 400 K/uL 171 143(L) 157   CMP Latest Ref Rng & Units 03/17/2021 12/13/2020 11/13/2020  Glucose 70 - 99 mg/dL 121(H) 137(H) 117(H)  BUN 8 - 23 mg/dL 21 14 16   Creatinine 0.61 - 1.24 mg/dL 1.32(H) 1.15 1.19  Sodium 135 - 145 mmol/L 139 143 140  Potassium 3.5 - 5.1 mmol/L 3.9 4.5 4.0  Chloride 98 - 111 mmol/L 107 106 106  CO2 22 - 32 mmol/L 24 29 27   Calcium  8.9 - 10.3 mg/dL 9.1 9.7 9.1  Total Protein 6.5 - 8.1 g/dL 6.4(L) 6.7 6.4(L)  Total Bilirubin 0.3 - 1.2 mg/dL 0.8 1.4(H) 1.3(H)  Alkaline Phos 38 - 126 U/L 83 70 78  AST 15 - 41 U/L  13(L) 13(L) 13(L)  ALT 0 - 44 U/L 9 10 7     DIAGNOSTIC IMAGING:  I have independently reviewed the scans and discussed with the patient. CT CHEST ABDOMEN PELVIS W CONTRAST  Result Date: 03/20/2021 CLINICAL DATA:  66 year old male with history of malignant neoplasm of the sigmoid colon status post bowel resection. Follow-up study. EXAM: CT CHEST, ABDOMEN, AND PELVIS WITH CONTRAST TECHNIQUE: Multidetector CT imaging of the chest, abdomen and pelvis was performed following the standard protocol during bolus administration of intravenous contrast. RADIATION DOSE REDUCTION: This exam was performed according to the departmental dose-optimization program which includes automated exposure control, adjustment of the mA and/or kV according to patient size and/or use of iterative reconstruction technique. CONTRAST:  174mL OMNIPAQUE IOHEXOL 300 MG/ML  SOLN COMPARISON:  Multiple priors, most recently 12/13/2020. FINDINGS: CT CHEST FINDINGS Cardiovascular: Heart size is normal. There is no significant pericardial fluid, thickening or pericardial calcification. There is aortic atherosclerosis, as well as atherosclerosis of the great vessels of the mediastinum and the coronary arteries, including calcified atherosclerotic plaque in the left anterior descending and left circumflex coronary arteries. Severe calcifications of the aortic valve. Moderate calcifications of the mitral annulus. Mediastinum/Nodes: No pathologically enlarged mediastinal or hilar lymph nodes. Esophagus is unremarkable in appearance. No axillary lymphadenopathy. Lungs/Pleura: No suspicious appearing pulmonary nodules or masses are noted. No acute consolidative airspace disease. No pleural effusions. Musculoskeletal: There are no aggressive appearing lytic or blastic  lesions noted in the visualized portions of the skeleton. CT ABDOMEN PELVIS FINDINGS Hepatobiliary: Tiny subcentimeter low-attenuation lesion in the inferior aspect of segment 6 of the liver (axial image 66 of series 2), too small to characterize, but stable compared to the prior study and favored to represent a tiny cyst. No other new suspicious cystic or solid hepatic lesions. No intra or extrahepatic biliary ductal dilatation. Status post cholecystectomy. Pancreas: No pancreatic mass. No pancreatic ductal dilatation. No pancreatic or peripancreatic fluid collections or inflammatory changes. Spleen: Unremarkable. Adrenals/Urinary Tract: 4 mm nonobstructive calculus in the lower pole collecting system of the left kidney. 3 mm nonobstructive calculus and smaller 1-2 mm nonobstructive calculi in the upper pole collecting system of the right kidney. Mild bilateral cortical atrophy in both kidneys. Bilateral adrenal glands are normal in appearance. Mild left hydroureteronephrosis, similar to the prior study. Minimal fullness in the right ureter, without frank right hydronephrosis. Urinary bladder is moderately distended with diffuse wall thickening. Several nonobstructive calculi are noted lying dependently in the lumen of the urinary bladder, largest of which measures 8 x 6 mm. Stomach/Bowel: Normal appearance of the stomach. No pathologic dilatation of small bowel or colon. Suture line near the rectosigmoid junction from prior partial colectomy. There continues to be some soft tissue thickening in the rectum distal to the suture line, with spiculations extending into the overlying mesorectal fat which also demonstrates some hazy infiltration. Chronic soft tissue thickening in the presacral soft tissues, similar to the prior examination inferiorly, but clearly increased superiorly best demonstrated on coronal image 109 of series 5 and sagittal image 116 of series 6 where this centrally low-attenuation peripherally  enhancing soft tissue measures up to 6.3 x 2.0 x 3.2 cm on today's examination, increased compared to the prior study. Status post appendectomy. Vascular/Lymphatic: Aortic atherosclerosis, without evidence of aneurysm or dissection in the abdominal or pelvic vasculature. Soft tissue mass in the upper presacral space increased compared to the prior study, as detailed below. No other definite lymphadenopathy confidently identified elsewhere in the abdomen or pelvis.  Reproductive: Prostate gland is enlarged and heterogeneous in appearance with severe median lobe hypertrophy measuring 6.2 x 6.0 x 9.3 cm. Other: No significant volume of ascites.  No pneumoperitoneum. Musculoskeletal: Lytic lesion in the left side of the sacrum extending into the left sacroiliac joint (axial image 95 of series 2) measuring 2.6 x 2.3 cm, similar to the prior study. Extensive mottled mixed lytic and sclerotic appearance throughout remaining upper portions of the sacrum, similar to the prior examination. IMPRESSION: 1. Metastatic disease to the sacrum redemonstrated, with prominent lytic lesion in the left side which has eroded into the left sacroiliac joint, similar to the prior study. 2. Persistent rectal thickening and prominence of the perirectal soft tissues and haziness in the mesorectal fat which is concerning for residual neoplasm with local infiltration of soft tissues. In addition, in the superior aspect of the presacral space there is an enlarging peripherally enhancing soft tissue mass which may represent nodal metastasis or other soft tissue metastasis. 3. Prostatomegaly with median lobe hypertrophy. This is again associated with a distended and thick-walled urinary bladder, suggesting chronic bladder outlet obstruction. Once again, there is mild left hydroureteronephrosis and mild right ureteral fullness associated with this. 4. Multiple nonobstructive calculi in the collecting systems of both kidneys and lying dependently in  the lumen of the urinary bladder. No definite ureteral stones are noted at this time. 5. Aortic atherosclerosis, in addition to 2 vessel coronary artery disease. Please note that although the presence of coronary artery calcium documents the presence of coronary artery disease, the severity of this disease and any potential stenosis cannot be assessed on this non-gated CT examination. Assessment for potential risk factor modification, dietary therapy or pharmacologic therapy may be warranted, if clinically indicated. 6. There are severe calcifications of the aortic valve and moderate calcifications of the mitral annulus. Echocardiographic correlation for evaluation of potential valvular dysfunction may be warranted if clinically indicated. Electronically Signed   By: Vinnie Langton M.D.   On: 03/20/2021 09:16     ASSESSMENT:  1.  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 -Laparoscopic partial hepatectomy, redo low anterior resection on 02/01/2019, pathology showing YPT3Y PN 0.  Positive circumferential/radial/mesenteric margin. -6 more cycles of FOLFOX completed on 05/31/2019. -Xeloda with radiation from 06/26/2019 through 08/03/2019. -CT CAP on 09/14/2019 shows postoperative changes in the rectosigmoid resection and reanastomosis with interval increase in posttreatment perirectal and presacral soft tissue thickening consistent with radiation changes.  No adenopathy.  Multiple stable small bilateral pulmonary nodules, likely benign.  No new evidence of metastatic disease.  Unchanged splenomegaly. - PET scan on 04/08/2020 done secondary to increase in CEA shows abnormal hypermetabolic soft tissue in the presacral region suspicious for recurrence.  Sclerosis involving sacrum.  Small left axillary lymph nodes favored to be reactive. -  MRI pelvis on 04/26/2020 with enhancing lesion involving S1 segment of the sacrum concerning for metastatic disease.  Peripherally enhancing soft tissue nodule within the presacral soft tissues measuring 2.4 x 2.1 cm. - PET scan on 07/01/2020 showed hypermetabolic soft tissue lesion in the presacral space shows interval progression.  Destructive changes in the sacrum also mildly progressive.  Interval development of diffuse marrow uptake, likely from growth factor. - I talked to Dr. Oneita Kras from radiation oncology at [facility].  She has recommended 2 to 3 months of chemotherapy followed by repeat  pelvic MRI. - Reviewed CT chest and abdomen from 09/16/2020 which showed stable small bilateral lung nodules measuring 4 mm and smaller, most likely benign.  No evidence of metastatic disease in the chest or abdomen. - Reviewed MRI pelvis with and without contrast from 09/16/2024.  Although it is reported that there is progression of the presacral mass, this MRI was compared to MRI from 04/26/2020.  He started treatment towards the end of May.  The presacral mass has slightly improved compared to the PET scan which was done around the time of starting chemotherapy in May.  However the sacral lesion has increased in size measuring 9.9 x 5.8 cm. - XRT to the pelvis from 10/28/2020 through 11/18/2020 with Xeloda.   PLAN:  1.  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 - Reviewed labs from 03/17/2021 which showed normal LFTs.  CBC shows normocytic anemia with hemoglobin 9.4.  CEA was elevated at 133, up from 30 on 12/13/2020. - We reviewed CT CAP from 03/19/2021 which showed metastatic disease to the sacrum with prominent lytic lesion on the left side which has eroded into the left sacroiliac joint.  Persistent rectal thickening and prominence of perirectal soft tissues and haziness in the mesorectal fat concerning for residual neoplasm with local infiltration of soft  tissues.  In addition in the superior aspect of the presacral space there is an enlarging peripherally enhancing soft tissue mass which may represent nodal metastasis or other soft tissue metastasis. - We have reviewed MRI of the pelvis with and without contrast from 02/17/2021 done at [facility] which showed decreased size and interval signal change of bulky aspect of the presacral mass suggesting response to treatment.  Similar presacral soft tissue extending more inferiorly.  Similar tethering of the posterior wall of the rectum near the LAR anastomosis.  Metastatic bone lesions remain unchanged. - He has a follow-up with Dr. Alfonso Ramus in May. - As his CEA has significantly increased, I have recommended PET CT scan evaluation. - We will do virtual visit to discuss results.   2.  Diarrhea: - Continue Lomotil as needed.   3.  Hypomagnesemia: - Continue magnesium twice daily.   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     Orders placed this encounter:  No orders of the defined types were placed in this encounter.    Derek Jack, MD [place] [phone]   I, Thana Ates, am acting as a scribe for Dr. Derek Jack.  I, Derek Jack MD, have reviewed the above documentation for accuracy and completeness, and I agree with the above.

## 2021-03-26 NOTE — Patient Instructions (Signed)
[place] at [facility] Discharge Instructions  You were seen and examined today by Dr. Delton Coombes. He reviewed your most recent labs and scan. The scan is showing that you have some stones in your kidneys, prostate is enlarged and bladder is thickened but nothing to explain the increase in the CEA numbers. We will schedule you for a PET scan to check for reasons that the CEA may be elevated. Please keep follow up phone appointment.    Thank you for choosing [place] at [facility] to provide your oncology and hematology care.  To afford each patient quality time with our provider, please arrive at least 15 minutes before your scheduled appointment time.   If you have a lab appointment with the [facility] please come in thru the Main Entrance and check in at the main information desk.  You need to re-schedule your appointment should you arrive 10 or more minutes late.  We strive to give you quality time with our providers, and arriving late affects you and other patients whose appointments are after yours.  Also, if you no show three or more times for appointments you may be dismissed from the clinic at the providers discretion.     Again, thank you for choosing [facility].  Our hope is that these requests will decrease the amount of time that you wait before being seen by our physicians.       _____________________________________________________________  Should you have questions after your visit to [facility], please contact our office at [phone] and follow the prompts.  Our office hours are 8:00 a.m. and 4:30 p.m. Monday - Friday.  Please note that voicemails left after 4:00 p.m. may not be returned until the following business day.  We are closed weekends and major holidays.  You do have access to a nurse 24-7, just call the main number to the clinic [phone] and do not press any options, hold on the line  and a nurse will answer the phone.    For prescription refill requests, have your pharmacy contact our office and allow 72 hours.    Due to Covid, you will need to wear a mask upon entering the hospital. If you do not have a mask, a mask will be given to you at the Main Entrance upon arrival. For doctor visits, patients may have 1 support person age 79 or older with them. For treatment visits, patients can not have anyone with them due to social distancing guidelines and our immunocompromised population.

## 2021-04-03 ENCOUNTER — Other Ambulatory Visit: Payer: Self-pay

## 2021-04-03 ENCOUNTER — Ambulatory Visit (HOSPITAL_COMMUNITY)
Admission: RE | Admit: 2021-04-03 | Discharge: 2021-04-03 | Disposition: A | Discharge status: Home / Self Care | Payer: BC Managed Care – PPO | Source: Ambulatory Visit | Attending: Hematology | Admitting: Hematology

## 2021-04-03 DIAGNOSIS — C187 Malignant neoplasm of sigmoid colon: Secondary | ICD-10-CM | POA: Insufficient documentation

## 2021-04-03 MED ORDER — FLUDEOXYGLUCOSE F - 18 (FDG) INJECTION
8.3700 | Freq: Once | INTRAVENOUS | Status: AC | PRN
  Administered 2021-04-03: 8.37 via INTRAVENOUS

## 2021-04-08 ENCOUNTER — Other Ambulatory Visit: Payer: Self-pay

## 2021-04-08 ENCOUNTER — Inpatient Hospital Stay (HOSPITAL_BASED_OUTPATIENT_CLINIC_OR_DEPARTMENT_OTHER): Payer: BC Managed Care – PPO | Admitting: Hematology

## 2021-04-08 DIAGNOSIS — C187 Malignant neoplasm of sigmoid colon: Secondary | ICD-10-CM | POA: Diagnosis not present

## 2021-04-08 NOTE — Progress Notes (Signed)
Virtual Visit via Telephone Note  I connected with Troy Smith on 04/08/21 at  3:45 PM EST by telephone and verified that I am speaking with the correct person using two identifiers.  Location: Patient: At home Provider: In the office   I discussed the limitations, risks, security and privacy concerns of performing an evaluation and management service by telephone and the availability of in person appointments. I also discussed with the patient that there may be a patient responsible charge related to this service. The patient expressed understanding and agreed to proceed.   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  His last treatment was XRT to the pelvis from 10/29/2018 through 11/18/2020 with Xeloda.   Observations/Objective: He does not report any lower back pain or pelvic pains.  No urinary symptoms.  He has occasional diarrhea for which he takes Lomotil.  Assessment and Plan:  1.  Metastatic sigmoid colon cancer to the liver: - Labs from 03/17/2021 showed CEA elevated at 133, up from 30 in November 2022. - CT CAP from 03/19/2021 showed metastatic disease to the sacrum with prominent lytic lesion on the left side which has eroded into the left sacroiliac joint.  Persistent rectal thickening and prominence of perirectal soft tissues and haziness in the mesorectal fat concerning for residual neoplasm with local infiltration of soft tissues. - We have discussed results of the PET scan from 04/07/2021.  Soft tissue thickening and rounded fluid collection in the presacral space with improved metabolic activity with SUV 3.5 decreased from SUV 8.0.  Mild radiotracer activity through the sacrum with increased sclerosis.  Metabolic activity decreased with SUV 4.3, decreased from 11.5.  No evidence of progression.  No evidence of liver metastasis. - He has follow-up with Dr. Alfonso Ramus in May when MRI of the pelvis will be  repeated. - I have recommended follow-up in 4 months with repeat PET scan and CEA level.   Follow Up Instructions: RTC 4 months with repeat PET scan and CEA level.   I discussed the assessment and treatment plan with the patient. The patient was provided an opportunity to ask questions and all were answered. The patient agreed with the plan and demonstrated an understanding of the instructions.   The patient was advised to call back or seek an in-person evaluation if the symptoms worsen or if the condition fails to improve as anticipated.  I provided 21 minutes of non-face-to-face time during this encounter.   Derek Jack, MD

## 2021-04-09 ENCOUNTER — Other Ambulatory Visit (HOSPITAL_COMMUNITY): Payer: Self-pay

## 2021-04-09 DIAGNOSIS — C187 Malignant neoplasm of sigmoid colon: Secondary | ICD-10-CM

## 2021-04-09 NOTE — Progress Notes (Signed)
Orders placed per Dr. Geroge Baseman. Please order PET CT scan, CEA, CBCD, CMP, magnesium in 4 months to be done 1 week prior.  RTC 4 months.  Thank you. ?

## 2021-05-14 ENCOUNTER — Encounter: Payer: Self-pay | Admitting: Neurology

## 2021-05-14 ENCOUNTER — Ambulatory Visit (INDEPENDENT_AMBULATORY_CARE_PROVIDER_SITE_OTHER): Payer: BC Managed Care – PPO | Admitting: Neurology

## 2021-05-14 VITALS — Ht 68.0 in | Wt 178.0 lb

## 2021-05-14 DIAGNOSIS — F0280 Dementia in other diseases classified elsewhere without behavioral disturbance: Secondary | ICD-10-CM

## 2021-05-14 DIAGNOSIS — G300 Alzheimer's disease with early onset: Secondary | ICD-10-CM

## 2021-05-14 DIAGNOSIS — C187 Malignant neoplasm of sigmoid colon: Secondary | ICD-10-CM

## 2021-05-14 MED ORDER — MEMANTINE HCL 10 MG PO TABS: 10.0000 mg | ORAL_TABLET | Freq: Two times a day (BID) | ORAL | 3 refills | Status: AC

## 2021-05-14 MED ORDER — DONEPEZIL HCL 10 MG PO TABS: ORAL_TABLET | ORAL | 3 refills | Status: AC

## 2021-05-14 NOTE — Progress Notes (Signed)
? ? ?Patient: Troy Smith ?Date of Birth: [birth year] ? ?Reason for Visit: Follow up for dementia ?History from: Patient, wife Shirlean Mylar ?Primary Neurologist: Dr. Leta Baptist  ? ?ASSESSMENT AND PLAN ?66 y.o. year old male  ? ?1.  Dementia, probable Alzheimer's disease ?2.  Rectal carcinoma ? ?-Continue to have decline, MOCA 8/30 ?-Continue Aricept 10 mg daily, Namenda 10 mg twice daily ?-At this point, recommend against driving, have given his wife information about driver's rehabilitation program for evaluation  ?-Referral to speech therapy for evaluation of cognitive training ?-Follow-up in 6 months or sooner if needed ? 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 On Aricept and Namenda. Short term memory is poor. Has trouble with directions out of town of [place]. Does short distance driving.  Does ADLs, housework, lawn work. Had COVID in December, was a setback for memory. Hasn't given up anything. Wife keeps up with appointments. 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 Seeing oncology for rectal carcinoma, had round of chemo last year and radiation, monitoring. Eats well, sleeps well. Weight stable since last visit.  ? ?HISTORY  ?11/08/2020 Dr. 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  He has a history of rectal carcinoma, he had recurrence of cancer and had to go through chemotherapy again in May 2022 and he is currently getting radiation treatments with 1 more treatment left.  The patient has had some cognitive decline around the time of the chemotherapy but this is now improving.  He is on Namenda and Aricept.  He is having occasional episodes of diarrhea but this is improving off of the chemotherapy.  He is sleeping fairly well, he does report some occasional vivid dreams.  The patient tolerates the Namenda well.  He comes in with his wife.  He  has not altered any activities of daily living since last seen.  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  He requires assistance keeping up with medications and appointments.  He denies any headaches or any numbness or weakness or balance changes that have occurred since last seen.  He returns for further evaluation. ? ?REVIEW OF SYSTEMS: Out of a complete 14 system review of symptoms, the patient complains only of the following symptoms, and all other reviewed systems are negative. ? ?See HPI ? ?ALLERGIES: ?Allergies  ?Allergen Reactions  ? Penicillin G Nausea And Vomiting  ? ? ?HOME MEDICATIONS: ?Outpatient Medications Prior to Visit  ?Medication Sig Dispense Refill  ? amLODipine (NORVASC) 10 MG tablet Take 10 mg by mouth daily.    ? diphenoxylate-atropine (LOMOTIL) 2.5-0.025 MG tablet TAKE 2 AFTER FIRST ONSET OF DIARRHEA OR LOOSE STOOL, THEN 1 AFTER EACH LOOSE STOOL. 60 tablet 0  ? donepezil (ARICEPT) 10 MG tablet TAKE (1) TABLET BY MOUTH AT BEDTIME. 90 tablet 3  ? insulin glargine (LANTUS) 100 UNIT/ML injection Inject 30 Units into the skin at bedtime.     ? loperamide (IMODIUM A-D) 2 MG tablet Take 2 at onset of diarrhea, then 1 after each watery bowel movement. 100 tablet 1  ? memantine (NAMENDA) 10 MG tablet Take 1 tablet (10 mg total) by mouth 2 (two) times daily. 180 tablet 3  ? metoprolol succinate (TOPROL-XL) 50 MG 24 hr tablet Take 50 mg by mouth daily. Take with or immediately following a meal.    ? potassium chloride  SA (KLOR-CON) 20 MEQ tablet Take 1 tablet (20 mEq total) by mouth daily. 30 tablet 3  ? capecitabine (XELODA) 500 MG tablet Take 3 tablets (1,500 mg total) by mouth 2 (two) times daily after a meal. Take Monday through Friday. Take only on days of radiation. 168 tablet 0  ? dexamethasone (DECADRON) 4 MG tablet Take 1 tablet (4 mg total) by mouth daily. Take in the morning with food 30 tablet 0  ? megestrol (MEGACE) 400 MG/10ML suspension 2 teaspoons in the  morning and 2 teaspoons in the evening 240 mL 0  ? prochlorperazine (COMPAZINE) 10 MG tablet Take 1 tablet (10 mg total) by mouth every 6 (six) hours as needed (Nausea or vomiting). 60 tablet 1  ? scopolamine (TRANSDERM-SCOP) 1 MG/3DAYS Place 1 patch (1.5 mg total) onto the skin every 3 (three) days. 10 patch 12  ? ?Facility-Administered Medications Prior to Visit  ?Medication Dose Route Frequency Provider Last Rate Last Admin  ? heparin lock flush 100 unit/mL  500 Units Intracatheter Once PRN Derek Jack, MD      ? sodium chloride flush (NS) 0.9 % injection 10 mL  10 mL Intracatheter PRN Derek Jack, MD      ? ? ?PAST MEDICAL HISTORY: ?Past Medical History:  ?Diagnosis Date  ? Adenocarcinoma of sigmoid colon ([place]) 03/23/2016  ? Carpal tunnel syndrome 05/02/2014  ? Bilateral  ? Cervical disc disorder with radiculopathy of cervical region 05/02/2014  ? Left C8  ? Colon cancer ([place]) 06/2020  ? Dementia ([place]) 11/08/2020  ? Diabetes mellitus without complication ([place])   ? Hypertension   ? Port-A-Cath in place 08/10/2018  ? ? ?PAST SURGICAL HISTORY: ?Past Surgical History:  ?Procedure Laterality Date  ? BIOPSY  03/20/2016  ? Procedure: BIOPSY;  Surgeon: Rogene Houston, MD;  Location: [facility];  Service: Endoscopy;;  colon  ? BOWEL RESECTION N/A 03/23/2016  ? Procedure: LOW ANTERIOR RESECTION;  Surgeon: Aviva Signs, MD;  Location: [facility] OR[facility];  Service: General;  Laterality: N/A;  ? COLON SURGERY  01/2019  ? [facility] at [facility]  ? COLONOSCOPY N/A 03/20/2016  ? Procedure: COLONOSCOPY;  Surgeon: Rogene Houston, MD;  Location: [facility];  Service: Endoscopy;  Laterality: N/A;  730  ? COLONOSCOPY N/A 07/10/2016  ? Procedure: COLONOSCOPY;  Surgeon: Rogene Houston, MD;  Location: [facility];  Service: Endoscopy;  Laterality: N/A;  1040  ? NO PAST SURGERIES    ? POLYPECTOMY  07/10/2016  ? Procedure: POLYPECTOMY;  Surgeon: Rogene Houston, MD;  [facility]: [facility];  Service: Endoscopy;;  colon  ?  PORTACATH PLACEMENT Left 08/05/2018  ? Procedure: INSERTION PORT-A-CATH;  Surgeon: Aviva Signs, MD;  Location: [facility];  Service: General;  Laterality: Left;  ? TONSILLECTOMY    ? ? ?FAMILY HISTORY: ?Family History  ?Problem Relation Age of Onset  ? COPD Mother   ? Melanoma Father   ? ? ?SOCIAL HISTORY: ?Social History  ? ?Socioeconomic History  ? Marital status: Married  ?  Spouse name: Not on file  ? Number of children: Not on file  ? Years of education: Not on file  ? Highest education level: Not on file  ?Occupational History  ? Not on file  ?Tobacco Use  ? Smoking status: Never  ? Smokeless tobacco: Never  ?Vaping Use  ? Vaping Use: Never used  ?Substance and Sexual Activity  ? Alcohol use: Yes  ?  Comment: rarely  ? Drug use: No  ?  Sexual activity: Not on file  ?Other Topics Concern  ? Not on file  ?Social History Narrative  ? Not on file  ? ?Social Determinants of Health  ? ?Financial Resource Strain: Not on file  ?Food Insecurity: Not on file  ?Transportation Needs: Not on file  ?Physical Activity: Not on file  ?Stress: Not on file  ?Social Connections: Not on file  ?Intimate Partner Violence: Not on file  ? ? ?PHYSICAL EXAM ? ?Vitals:  ? 05/14/21 1232  ?Weight: 178 lb (80.7 kg)  ?Height: '5\' 8"'$  (1.727 m)  ? ?Body mass index is 27.06 kg/m?. ? ?  05/14/2021  ? 12:39 PM  ?Montreal Cognitive Assessment   ?Visuospatial/ Executive (0/5) 1  ?Naming (0/3) 3  ?Attention: Read list of digits (0/2) 1  ?Attention: Read list of letters (0/1) 0  ?Attention: Serial 7 subtraction starting at 100 (0/3) 0  ?Language: Repeat phrase (0/2) 0  ?Language : Fluency (0/1) 0  ?Abstraction (0/2) 1  ?Delayed Recall (0/5) 0  ?Orientation (0/6) 2  ?Total 8  ?Adjusted Score (based on education) 8  ? ? ?Generalized: Well developed, in no acute distress  ?Neurological examination  ?Mentation: Alert, most history is provided by his wife, there is some repetition noted, patient is very pleasant, cooperative, mild word finding difficulty, mild  difficulty following exam commands ?Cranial nerve II-XII: Pupils were equal round reactive to light. Extraocular movements were full, visual field were full on confrontational test. Facial sensation and strength w

## 2021-05-14 NOTE — Patient Instructions (Signed)
Great to meet you today  ?Continue current medications ?Close monitoring of driving, would recommend against independent living, consider drivers rehab evaluation  ?Return back in 6 months ?

## 2021-05-15 ENCOUNTER — Other Ambulatory Visit (HOSPITAL_COMMUNITY): Payer: Self-pay

## 2021-05-15 ENCOUNTER — Encounter (HOSPITAL_COMMUNITY): Payer: Self-pay

## 2021-05-15 MED ORDER — HYDROCODONE-ACETAMINOPHEN 5-325 MG PO TABS: 1.0000 | ORAL_TABLET | Freq: Three times a day (TID) | ORAL | 0 refills | Status: DC | PRN

## 2021-05-15 NOTE — Progress Notes (Signed)
Patient's wife called today reporting intermittent pain in the back and tail bone pain that is not relieved by OTC pain medications. States that pain is intermittent and there are no precipitating factors. Dr. Delton Coombes made aware. Order received for hydrocodone-acetaminophen 5-'325mg'$  every 8 hours PRN per Dr. Delton Coombes. Patient's wife made aware, verbalized understanding. ?

## 2021-05-22 ENCOUNTER — Encounter (HOSPITAL_COMMUNITY): Payer: Self-pay

## 2021-05-22 ENCOUNTER — Encounter (HOSPITAL_COMMUNITY)
Admission: EM | Disposition: A | Discharge status: Home / Self Care | Payer: Self-pay | Source: Home / Self Care | Attending: Internal Medicine

## 2021-05-22 ENCOUNTER — Inpatient Hospital Stay (HOSPITAL_COMMUNITY)
Admission: EM | Admit: 2021-05-22 | Discharge: 2021-05-24 | DRG: 378 | Disposition: A | Discharge status: Home / Self Care | Payer: BC Managed Care – PPO | Attending: Internal Medicine | Admitting: Internal Medicine

## 2021-05-22 ENCOUNTER — Emergency Department (HOSPITAL_COMMUNITY): Payer: BC Managed Care – PPO

## 2021-05-22 ENCOUNTER — Other Ambulatory Visit: Payer: Self-pay

## 2021-05-22 ENCOUNTER — Inpatient Hospital Stay (HOSPITAL_COMMUNITY): Payer: BC Managed Care – PPO | Admitting: Anesthesiology

## 2021-05-22 DIAGNOSIS — Z808 Family history of malignant neoplasm of other organs or systems: Secondary | ICD-10-CM

## 2021-05-22 DIAGNOSIS — K269 Duodenal ulcer, unspecified as acute or chronic, without hemorrhage or perforation: Secondary | ICD-10-CM | POA: Diagnosis present

## 2021-05-22 DIAGNOSIS — R0602 Shortness of breath: Secondary | ICD-10-CM | POA: Diagnosis present

## 2021-05-22 DIAGNOSIS — T39395A Adverse effect of other nonsteroidal anti-inflammatory drugs [NSAID], initial encounter: Secondary | ICD-10-CM | POA: Diagnosis present

## 2021-05-22 DIAGNOSIS — Z923 Personal history of irradiation: Secondary | ICD-10-CM | POA: Diagnosis not present

## 2021-05-22 DIAGNOSIS — I10 Essential (primary) hypertension: Secondary | ICD-10-CM | POA: Diagnosis present

## 2021-05-22 DIAGNOSIS — D649 Anemia, unspecified: Secondary | ICD-10-CM | POA: Diagnosis not present

## 2021-05-22 DIAGNOSIS — Z79899 Other long term (current) drug therapy: Secondary | ICD-10-CM | POA: Diagnosis not present

## 2021-05-22 DIAGNOSIS — E1169 Type 2 diabetes mellitus with other specified complication: Secondary | ICD-10-CM | POA: Diagnosis not present

## 2021-05-22 DIAGNOSIS — K254 Chronic or unspecified gastric ulcer with hemorrhage: Secondary | ICD-10-CM | POA: Diagnosis present

## 2021-05-22 DIAGNOSIS — K922 Gastrointestinal hemorrhage, unspecified: Secondary | ICD-10-CM | POA: Diagnosis not present

## 2021-05-22 DIAGNOSIS — Z880 Allergy status to penicillin: Secondary | ICD-10-CM | POA: Diagnosis not present

## 2021-05-22 DIAGNOSIS — N289 Disorder of kidney and ureter, unspecified: Secondary | ICD-10-CM

## 2021-05-22 DIAGNOSIS — G300 Alzheimer's disease with early onset: Secondary | ICD-10-CM | POA: Diagnosis not present

## 2021-05-22 DIAGNOSIS — Z825 Family history of asthma and other chronic lower respiratory diseases: Secondary | ICD-10-CM

## 2021-05-22 DIAGNOSIS — K449 Diaphragmatic hernia without obstruction or gangrene: Secondary | ICD-10-CM | POA: Diagnosis present

## 2021-05-22 DIAGNOSIS — E119 Type 2 diabetes mellitus without complications: Secondary | ICD-10-CM

## 2021-05-22 DIAGNOSIS — C7951 Secondary malignant neoplasm of bone: Secondary | ICD-10-CM | POA: Diagnosis present

## 2021-05-22 DIAGNOSIS — F02B0 Dementia in other diseases classified elsewhere, moderate, without behavioral disturbance, psychotic disturbance, mood disturbance, and anxiety: Secondary | ICD-10-CM | POA: Diagnosis not present

## 2021-05-22 DIAGNOSIS — G893 Neoplasm related pain (acute) (chronic): Secondary | ICD-10-CM | POA: Diagnosis present

## 2021-05-22 DIAGNOSIS — K921 Melena: Secondary | ICD-10-CM

## 2021-05-22 DIAGNOSIS — C787 Secondary malignant neoplasm of liver and intrahepatic bile duct: Secondary | ICD-10-CM | POA: Diagnosis present

## 2021-05-22 DIAGNOSIS — K264 Chronic or unspecified duodenal ulcer with hemorrhage: Secondary | ICD-10-CM | POA: Diagnosis not present

## 2021-05-22 DIAGNOSIS — D62 Acute posthemorrhagic anemia: Secondary | ICD-10-CM

## 2021-05-22 DIAGNOSIS — F0390 Unspecified dementia without behavioral disturbance: Secondary | ICD-10-CM | POA: Diagnosis present

## 2021-05-22 DIAGNOSIS — I248 Other forms of acute ischemic heart disease: Secondary | ICD-10-CM | POA: Diagnosis present

## 2021-05-22 DIAGNOSIS — Z9221 Personal history of antineoplastic chemotherapy: Secondary | ICD-10-CM

## 2021-05-22 DIAGNOSIS — Z794 Long term (current) use of insulin: Secondary | ICD-10-CM

## 2021-05-22 HISTORY — PX: ESOPHAGOGASTRODUODENOSCOPY (EGD) WITH PROPOFOL: SHX5813

## 2021-05-22 LAB — CBC
HCT: 24.4 % — ABNORMAL LOW (ref 39.0–52.0)
Hemoglobin: 8.1 g/dL — ABNORMAL LOW (ref 13.0–17.0)
MCH: 29.2 pg (ref 26.0–34.0)
MCHC: 33.2 g/dL (ref 30.0–36.0)
MCV: 88.1 fL (ref 80.0–100.0)
Platelets: 152 10*3/uL (ref 150–400)
RBC: 2.77 MIL/uL — ABNORMAL LOW (ref 4.22–5.81)
RDW: 17 % — ABNORMAL HIGH (ref 11.5–15.5)
WBC: 5.9 10*3/uL (ref 4.0–10.5)
nRBC: 0.5 % — ABNORMAL HIGH (ref 0.0–0.2)

## 2021-05-22 LAB — CBC WITH DIFFERENTIAL/PLATELET
Abs Immature Granulocytes: 0.33 10*3/uL — ABNORMAL HIGH (ref 0.00–0.07)
Abs Immature Granulocytes: 0.36 10*3/uL — ABNORMAL HIGH (ref 0.00–0.07)
Basophils Absolute: 0 10*3/uL (ref 0.0–0.1)
Basophils Absolute: 0 10*3/uL (ref 0.0–0.1)
Basophils Relative: 0 %
Basophils Relative: 0 %
Eosinophils Absolute: 0 10*3/uL (ref 0.0–0.5)
Eosinophils Absolute: 0 10*3/uL (ref 0.0–0.5)
Eosinophils Relative: 0 %
Eosinophils Relative: 1 %
HCT: 15 % — ABNORMAL LOW (ref 39.0–52.0)
HCT: 21.6 % — ABNORMAL LOW (ref 39.0–52.0)
Hemoglobin: 4.6 g/dL — CL (ref 13.0–17.0)
Hemoglobin: 7 g/dL — ABNORMAL LOW (ref 13.0–17.0)
Immature Granulocytes: 4 %
Immature Granulocytes: 5 %
Lymphocytes Relative: 9 %
Lymphocytes Relative: 8 %
Lymphs Abs: 0.7 10*3/uL (ref 0.7–4.0)
Lymphs Abs: 0.6 10*3/uL — ABNORMAL LOW (ref 0.7–4.0)
MCH: 28.2 pg (ref 26.0–34.0)
MCH: 29.2 pg (ref 26.0–34.0)
MCHC: 30.7 g/dL (ref 30.0–36.0)
MCHC: 32.4 g/dL (ref 30.0–36.0)
MCV: 92 fL (ref 80.0–100.0)
MCV: 90 fL (ref 80.0–100.0)
Monocytes Absolute: 0.6 10*3/uL (ref 0.1–1.0)
Monocytes Absolute: 0.6 10*3/uL (ref 0.1–1.0)
Monocytes Relative: 7 %
Monocytes Relative: 8 %
Neutro Abs: 6.4 10*3/uL (ref 1.7–7.7)
Neutro Abs: 5.8 10*3/uL (ref 1.7–7.7)
Neutrophils Relative %: 80 %
Neutrophils Relative %: 78 %
Platelets: 173 10*3/uL (ref 150–400)
Platelets: 167 10*3/uL (ref 150–400)
RBC: 1.63 MIL/uL — ABNORMAL LOW (ref 4.22–5.81)
RBC: 2.4 MIL/uL — ABNORMAL LOW (ref 4.22–5.81)
RDW: 18.1 % — ABNORMAL HIGH (ref 11.5–15.5)
RDW: 17.2 % — ABNORMAL HIGH (ref 11.5–15.5)
WBC: 8.1 10*3/uL (ref 4.0–10.5)
WBC: 7.4 10*3/uL (ref 4.0–10.5)
nRBC: 0.9 % — ABNORMAL HIGH (ref 0.0–0.2)
nRBC: 0.7 % — ABNORMAL HIGH (ref 0.0–0.2)

## 2021-05-22 LAB — HEMOGLOBIN A1C
Hgb A1c MFr Bld: 8.3 % — ABNORMAL HIGH (ref 4.8–5.6)
Mean Plasma Glucose: 191.51 mg/dL

## 2021-05-22 LAB — COMPREHENSIVE METABOLIC PANEL
ALT: 11 U/L (ref 0–44)
ALT: 11 U/L (ref 0–44)
AST: 13 U/L — ABNORMAL LOW (ref 15–41)
AST: 13 U/L — ABNORMAL LOW (ref 15–41)
Albumin: 2.7 g/dL — ABNORMAL LOW (ref 3.5–5.0)
Albumin: 2.9 g/dL — ABNORMAL LOW (ref 3.5–5.0)
Alkaline Phosphatase: 112 U/L (ref 38–126)
Alkaline Phosphatase: 124 U/L (ref 38–126)
Anion gap: 9 (ref 5–15)
Anion gap: 8 (ref 5–15)
BUN: 34 mg/dL — ABNORMAL HIGH (ref 8–23)
BUN: 30 mg/dL — ABNORMAL HIGH (ref 8–23)
CO2: 21 mmol/L — ABNORMAL LOW (ref 22–32)
CO2: 24 mmol/L (ref 22–32)
Calcium: 8.6 mg/dL — ABNORMAL LOW (ref 8.9–10.3)
Calcium: 9.1 mg/dL (ref 8.9–10.3)
Chloride: 109 mmol/L (ref 98–111)
Chloride: 110 mmol/L (ref 98–111)
Creatinine, Ser: 1.42 mg/dL — ABNORMAL HIGH (ref 0.61–1.24)
Creatinine, Ser: 1.19 mg/dL (ref 0.61–1.24)
GFR, Estimated: 55 mL/min — ABNORMAL LOW (ref >60)
GFR, Estimated: >60 mL/min (ref >60)
Glucose, Bld: 242 mg/dL — ABNORMAL HIGH (ref 70–99)
Glucose, Bld: 102 mg/dL — ABNORMAL HIGH (ref 70–99)
Potassium: 3.8 mmol/L (ref 3.5–5.1)
Potassium: 4.1 mmol/L (ref 3.5–5.1)
Sodium: 139 mmol/L (ref 135–145)
Sodium: 142 mmol/L (ref 135–145)
Total Bilirubin: 0.6 mg/dL (ref 0.3–1.2)
Total Bilirubin: 1.1 mg/dL (ref 0.3–1.2)
Total Protein: 5.1 g/dL — ABNORMAL LOW (ref 6.5–8.1)
Total Protein: 5.5 g/dL — ABNORMAL LOW (ref 6.5–8.1)

## 2021-05-22 LAB — POC OCCULT BLOOD, ED: Fecal Occult Bld: POSITIVE — AB

## 2021-05-22 LAB — PREPARE RBC (CROSSMATCH)

## 2021-05-22 LAB — TROPONIN I (HIGH SENSITIVITY)
Troponin I (High Sensitivity): 17 ng/L (ref <18)
Troponin I (High Sensitivity): 34 ng/L — ABNORMAL HIGH (ref <18)

## 2021-05-22 LAB — BLOOD GAS, VENOUS
Acid-Base Excess: 0.5 mmol/L (ref 0.0–2.0)
Bicarbonate: 25.4 mmol/L (ref 20.0–28.0)
Drawn by: 53361
FIO2: 21 %
O2 Saturation: 27.2 %
Patient temperature: 37
pCO2, Ven: 41 mmHg — ABNORMAL LOW (ref 44–60)
pH, Ven: 7.4 (ref 7.25–7.43)
pO2, Ven: <31 mmHg — CL (ref 32–45)

## 2021-05-22 LAB — MAGNESIUM
Magnesium: 1.7 mg/dL (ref 1.7–2.4)
Magnesium: 2 mg/dL (ref 1.7–2.4)

## 2021-05-22 LAB — GLUCOSE, CAPILLARY
Glucose-Capillary: 138 mg/dL — ABNORMAL HIGH (ref 70–99)
Glucose-Capillary: 122 mg/dL — ABNORMAL HIGH (ref 70–99)
Glucose-Capillary: 132 mg/dL — ABNORMAL HIGH (ref 70–99)
Glucose-Capillary: 141 mg/dL — ABNORMAL HIGH (ref 70–99)

## 2021-05-22 LAB — BRAIN NATRIURETIC PEPTIDE: B Natriuretic Peptide: 107 pg/mL — ABNORMAL HIGH (ref 0.0–100.0)

## 2021-05-22 LAB — PROTIME-INR
INR: 1 (ref 0.8–1.2)
Prothrombin Time: 13.4 seconds (ref 11.4–15.2)

## 2021-05-22 LAB — CBG MONITORING, ED
Glucose-Capillary: 164 mg/dL — ABNORMAL HIGH (ref 70–99)
Glucose-Capillary: 113 mg/dL — ABNORMAL HIGH (ref 70–99)

## 2021-05-22 LAB — HEMOGLOBIN AND HEMATOCRIT, BLOOD
HCT: 24 % — ABNORMAL LOW (ref 39.0–52.0)
Hemoglobin: 8 g/dL — ABNORMAL LOW (ref 13.0–17.0)

## 2021-05-22 LAB — HIV ANTIBODY (ROUTINE TESTING W REFLEX): HIV Screen 4th Generation wRfx: NONREACTIVE

## 2021-05-22 SURGERY — ESOPHAGOGASTRODUODENOSCOPY (EGD) WITH PROPOFOL
Anesthesia: General

## 2021-05-22 MED ORDER — ONDANSETRON HCL 4 MG/2ML IJ SOLN: 4.0000 mg | Freq: Four times a day (QID) | INTRAMUSCULAR | Status: DC | PRN

## 2021-05-22 MED ORDER — SODIUM CHLORIDE 0.9 % IV SOLN
10.0000 mL/h | Freq: Once | INTRAVENOUS | Status: AC
  Administered 2021-05-22: 10 mL/h via INTRAVENOUS

## 2021-05-22 MED ORDER — CHLORHEXIDINE GLUCONATE CLOTH 2 % EX PADS
6.0000 | MEDICATED_PAD | Freq: Every day | CUTANEOUS | Status: DC
  Administered 2021-05-22 – 2021-05-24 (×3): 6 via TOPICAL

## 2021-05-22 MED ORDER — ACETAMINOPHEN 325 MG PO TABS
650.0000 mg | ORAL_TABLET | Freq: Four times a day (QID) | ORAL | Status: DC | PRN
  Administered 2021-05-22: 650 mg via ORAL
  Filled 2021-05-22: qty 2

## 2021-05-22 MED ORDER — INSULIN ASPART 100 UNIT/ML IJ SOLN
0.0000 [IU] | Freq: Every day | INTRAMUSCULAR | Status: DC
  Administered 2021-05-22: 2 [IU] via SUBCUTANEOUS

## 2021-05-22 MED ORDER — PROPOFOL 10 MG/ML IV BOLUS
INTRAVENOUS | Status: DC | PRN
  Administered 2021-05-22: 50 mg via INTRAVENOUS
  Administered 2021-05-22: 100 mg via INTRAVENOUS
  Administered 2021-05-22: 50 mg via INTRAVENOUS

## 2021-05-22 MED ORDER — LACTATED RINGERS IV SOLN: INTRAVENOUS | Status: DC

## 2021-05-22 MED ORDER — SODIUM CHLORIDE 0.9 % IV SOLN: INTRAVENOUS | Status: DC

## 2021-05-22 MED ORDER — MORPHINE SULFATE (PF) 2 MG/ML IV SOLN: 2.0000 mg | INTRAVENOUS | Status: DC | PRN

## 2021-05-22 MED ORDER — INSULIN ASPART 100 UNIT/ML IJ SOLN
0.0000 [IU] | Freq: Three times a day (TID) | INTRAMUSCULAR | Status: DC
  Administered 2021-05-22: 3 [IU] via SUBCUTANEOUS
  Administered 2021-05-22 – 2021-05-24 (×2): 2 [IU] via SUBCUTANEOUS
  Filled 2021-05-22: qty 1

## 2021-05-22 MED ORDER — HEPARIN SOD (PORK) LOCK FLUSH 100 UNIT/ML IV SOLN
500.0000 [IU] | INTRAVENOUS | Status: AC | PRN
  Administered 2021-05-22: 500 [IU]

## 2021-05-22 MED ORDER — ACETAMINOPHEN 325 MG PO TABS
650.0000 mg | ORAL_TABLET | Freq: Once | ORAL | Status: AC
  Administered 2021-05-22: 650 mg via ORAL
  Filled 2021-05-22: qty 2

## 2021-05-22 MED ORDER — SODIUM CHLORIDE 0.9 % IV SOLN: 10.0000 mL/h | Freq: Once | INTRAVENOUS | Status: DC

## 2021-05-22 MED ORDER — PANTOPRAZOLE SODIUM 40 MG IV SOLR
40.0000 mg | Freq: Two times a day (BID) | INTRAVENOUS | Status: DC
  Administered 2021-05-22: 40 mg via INTRAVENOUS
  Filled 2021-05-22: qty 10

## 2021-05-22 MED ORDER — ACETAMINOPHEN 650 MG RE SUPP: 650.0000 mg | Freq: Four times a day (QID) | RECTAL | Status: DC | PRN

## 2021-05-22 MED ORDER — MEMANTINE HCL 10 MG PO TABS
10.0000 mg | ORAL_TABLET | Freq: Two times a day (BID) | ORAL | Status: DC
  Administered 2021-05-22 – 2021-05-24 (×4): 10 mg via ORAL
  Filled 2021-05-22 (×5): qty 1

## 2021-05-22 MED ORDER — DONEPEZIL HCL 5 MG PO TABS
10.0000 mg | ORAL_TABLET | Freq: Every day | ORAL | Status: DC
  Administered 2021-05-22 – 2021-05-23 (×2): 10 mg via ORAL
  Filled 2021-05-22 (×2): qty 2

## 2021-05-22 MED ORDER — LIDOCAINE HCL (CARDIAC) PF 50 MG/5ML IV SOSY
PREFILLED_SYRINGE | INTRAVENOUS | Status: DC | PRN
  Administered 2021-05-22: 50 mg via INTRAVENOUS

## 2021-05-22 MED ORDER — METOPROLOL SUCCINATE ER 50 MG PO TB24
50.0000 mg | ORAL_TABLET | Freq: Every day | ORAL | Status: DC
  Administered 2021-05-23 – 2021-05-24 (×2): 50 mg via ORAL
  Filled 2021-05-22 (×3): qty 1

## 2021-05-22 MED ORDER — PANTOPRAZOLE INFUSION (NEW) - SIMPLE MED
8.0000 mg/h | INTRAVENOUS | Status: DC
Start: 2021-05-22 — End: 2021-05-24
  Administered 2021-05-22 – 2021-05-24 (×4): 8 mg/h via INTRAVENOUS
  Filled 2021-05-22: qty 100
  Filled 2021-05-22 (×2): qty 80
  Filled 2021-05-22: qty 100
  Filled 2021-05-22: qty 80
  Filled 2021-05-22 (×2): qty 100
  Filled 2021-05-22: qty 80

## 2021-05-22 MED ORDER — ONDANSETRON HCL 4 MG PO TABS: 4.0000 mg | ORAL_TABLET | Freq: Four times a day (QID) | ORAL | Status: DC | PRN

## 2021-05-22 MED ORDER — HYDROCODONE-ACETAMINOPHEN 5-325 MG PO TABS: 1.0000 | ORAL_TABLET | Freq: Three times a day (TID) | ORAL | Status: DC | PRN

## 2021-05-22 MED ORDER — DIPHENHYDRAMINE HCL 25 MG PO CAPS
25.0000 mg | ORAL_CAPSULE | Freq: Once | ORAL | Status: AC
  Administered 2021-05-22: 25 mg via ORAL
  Filled 2021-05-22: qty 1

## 2021-05-22 MED ORDER — SUCRALFATE 1 GM/10ML PO SUSP
1.0000 g | Freq: Three times a day (TID) | ORAL | Status: DC
  Administered 2021-05-22 – 2021-05-24 (×8): 1 g via ORAL
  Filled 2021-05-22 (×9): qty 10

## 2021-05-22 MED ORDER — SODIUM CHLORIDE 0.9% IV SOLUTION
Freq: Once | INTRAVENOUS | Status: AC
Start: 2021-05-22 — End: 2021-05-22

## 2021-05-22 MED ORDER — HEPARIN SOD (PORK) LOCK FLUSH 100 UNIT/ML IV SOLN
INTRAVENOUS | Status: AC
  Filled 2021-05-22: qty 5

## 2021-05-22 MED ORDER — INSULIN DETEMIR 100 UNIT/ML ~~LOC~~ SOLN
15.0000 [IU] | Freq: Every day | SUBCUTANEOUS | Status: DC
  Administered 2021-05-22 – 2021-05-23 (×2): 15 [IU] via SUBCUTANEOUS
  Filled 2021-05-22 (×3): qty 0.15

## 2021-05-22 MED ORDER — SODIUM CHLORIDE 0.9% FLUSH
10.0000 mL | INTRAVENOUS | Status: AC | PRN
  Administered 2021-05-22: 10 mL

## 2021-05-22 NOTE — Assessment & Plan Note (Addendum)
-  Continue Aricept and Namenda ?-Continue constant reorientation. ? ?

## 2021-05-22 NOTE — Assessment & Plan Note (Addendum)
-  Fairly well-controlled blood pressure and rising. ?-Safe to resume home antihypertensive agents at discharge. ?-Advised to follow heart healthy diet. ? ?

## 2021-05-22 NOTE — Assessment & Plan Note (Addendum)
-   Hemoglobin 4.6 ?-In the setting of upper GI bleed due to NSAIDs usage. ?-Patient received 3 units of PRBCs on 05/22/2021; reports improvement in his dyspnea feelings; no chest pain and demonstrating good oxygen saturation on room air.. ?-Patient reports no further overt bleeding; nursing staff expressed that patient is still having some melanotic loose stools (but significantly less and improved in quality) ?-Hemoglobin 8.1 at time of discharge; improved and trending up. ?-Patient has completed 72 hours of IV PPI infusion as recommended by GI service and feels ready to go home. ?-Discharged on PPI twice a day along with Carafate. ?-Outpatient follow-up with gastroenterology service ?-Repeat CBC to follow hemoglobin trend as an outpatient ?-Advised to avoid the use of NSAIDs and to maintain adequate hydration.  ?

## 2021-05-22 NOTE — ED Notes (Signed)
Date and time results received: 05/22/21 10:16 AM ? ? ?Test: PO2 ?Critical Value: Less than 31 ? ?Name of Provider Notified:  ? ?Orders Received? Or Actions Taken?: see orders ?

## 2021-05-22 NOTE — Op Note (Signed)
[facility] ?Patient Name: Troy Smith ?Procedure Date: 05/22/2021 2:16 PM ?MRN: [id number] ?Date of Birth: [birth year] ?Attending MD: Hildred Laser , MD ?CSN: [id number] ?Age: 66 ?Admit Type: Outpatient ?Procedure:                Upper GI endoscopy ?Indications:              Melena ?Providers:                Hildred Laser, MD, Rosina Lowenstein, RN, Caprice Kluver,  ?                          Randa Spike, Technician ?Referring MD:             Barton Dubois, MD ?Medicines:                Propofol per Anesthesia ?Complications:            No immediate complications. ?Estimated Blood Loss:     Estimated blood loss: none. ?Procedure:                Pre-Anesthesia Assessment: ?                          - Prior to the procedure, a History and Physical  ?                          was performed, and patient medications and  ?                          allergies were reviewed. The patient's tolerance of  ?                          previous anesthesia was also reviewed. The risks  ?                          and benefits of the procedure and the sedation  ?                          options and risks were discussed with the patient.  ?                          All questions were answered, and informed consent  ?                          was obtained. Prior Anticoagulants: The patient has  ?                          taken no previous anticoagulant or antiplatelet  ?                          agents except for NSAID medication. ASA Grade  ?                          Assessment: III - A patient with severe systemic  ?  disease. After reviewing the risks and benefits,  ?                          the patient was deemed in satisfactory condition to  ?                          undergo the procedure. ?                          After obtaining informed consent, the endoscope was  ?                          passed under direct vision. Throughout the  ?                          procedure, the patient's blood  pressure, pulse, and  ?                          oxygen saturations were monitored continuously. The  ?                          GIF-H190 ([id number]) scope was introduced through the  ?                          mouth, and advanced to the second part of duodenum.  ?                          The upper GI endoscopy was accomplished without  ?                          difficulty. The patient tolerated the procedure  ?                          well. ?Scope In: 2:37:03 PM ?Scope Out: 2:45:06 PM ?Total Procedure Duration: 0 hours 8 minutes 3 seconds  ?Findings: ?     The hypopharynx was normal. ?     The examined esophagus was normal. ?     The Z-line was regular and was found 37 cm from the incisors. ?     A 3 cm hiatal hernia was present. ?     Three non-bleeding cratered gastric ulcers with adherent clot were found  ?     in the gastric body and on the greater curvature of the gastric body.  ?     The largest lesion was 5 mm in largest dimension. To prevent bleeding  ?     post-intervention, one hemostatic clip was successfully placed (MR  ?     conditional). There was no bleeding during, or at the end, of the  ?     procedure. ?     The exam of the stomach was otherwise normal. ?     Two non-bleeding cratered and superficial duodenal ulcers with no  ?     stigmata of bleeding were found in the duodenal bulb. The largest lesion  ?     was 20 mm in largest dimension. ?     The second portion of the duodenum was normal. ?Impression:               -  Normal hypopharynx. ?                          - Normal esophagus. ?                          - Z-line regular, 37 cm from the incisors. ?                          - 3 cm hiatal hernia. ?                          - Non-bleeding gastric ulcers with adherent clot.  ?                          Clip (MR conditional) was placed. ?                          - Non-bleeding duodenal ulcers with no stigmata of  ?                          bleeding. ?                          - Normal second  portion of the duodenum. ?                          - No specimens collected. ?Moderate Sedation: ?     Per Anesthesia Care ?Recommendation:           - Return patient to hospital ward for ongoing care. ?                          - Full liquid diet today. ?                          - Continue present medications. ?                          - No aspirin, ibuprofen, naproxen, or other  ?                          non-steroidal anti-inflammatory drugs. ?                          - Repeat upper endoscopy in 10 weeks to check  ?                          healing. ?                          - Sucralfate 1 g p.o. before meals and nightly  ?                          during hospitalization. ?                          - H. pylori serology. ?                          -  Repeat upper endoscopy in 10 weeks to check  ?                          healing. ?Procedure Code(s):        --- Professional --- ?                          [phone], Esophagogastroduodenoscopy, flexible,  ?                          transoral; diagnostic, including collection of  ?                          specimen(s) by brushing or washing, when performed  ?                          (separate procedure) ?Diagnosis Code(s):        --- Professional --- ?                          K44.9, Diaphragmatic hernia without obstruction or  ?                          gangrene ?                          K25.4, Chronic or unspecified gastric ulcer with  ?                          hemorrhage ?                          K26.9, Duodenal ulcer, unspecified as acute or  ?                          chronic, without hemorrhage or perforation ?                          K92.1, Melena (includes Hematochezia) ?CPT copyright 2019 American Medical Association. All rights reserved. ?The codes documented in this report are preliminary and upon coder review may  ?be revised to meet current compliance requirements. ?Hildred Laser, MD ?Hildred Laser, MD ?05/22/2021 2:59:13 PM ?This report has been  signed electronically. ?Number of Addenda: 0 ?

## 2021-05-22 NOTE — Anesthesia Procedure Notes (Signed)
Date/Time: 05/22/2021 2:27 PM ?Performed by: Vista Deck, CRNA ?Pre-anesthesia Checklist: Patient identified, Emergency Drugs available, Suction available, Timeout performed and Patient being monitored ?Patient Re-evaluated:Patient Re-evaluated prior to induction ?Oxygen Delivery Method: Nasal Cannula ? ? ? ? ?

## 2021-05-22 NOTE — Assessment & Plan Note (Addendum)
-  Upper GI bleed in the setting of stomach and duodenal ulcers due to NSAID usage. ?-Status post endoscopic evaluation by GI service ?-Patient will follow-up with GI as an outpatient ?-Continue PPI twice a day and the use of Carafate for 10 days ? ?

## 2021-05-22 NOTE — Anesthesia Preprocedure Evaluation (Signed)
Anesthesia Evaluation  ?Patient identified by MRN, date of birth, ID band ?Patient awake ? ? ? ?Reviewed: ?Allergy & Precautions, NPO status , Patient's Chart, lab work & pertinent test results ? ?Airway ?Mallampati: II ? ?TM Distance: >3 FB ?Neck ROM: Full ? ? ?Comment: Cervical disc disease with radiculopathy  Dental ? ?(+) Dental Advisory Given, Missing ?  ?Pulmonary ?neg pulmonary ROS,  ?  ?Pulmonary exam normal ?breath sounds clear to auscultation ? ? ? ? ? ? Cardiovascular ?Exercise Tolerance: Good ?hypertension, Pt. on medications ?+ Past MI (high troponins, possible demand ischemia from severe anemia)  ?Normal cardiovascular exam ?Rhythm:Regular Rate:Normal ? ? ?  ?Neuro/Psych ?PSYCHIATRIC DISORDERS Dementia  Neuromuscular disease (Cervical disc disease with radiculopathy )   ? GI/Hepatic ?Liver mets? ?Sigmoid colon cancer, upper GI Bleeding ?  ?Endo/Other  ?diabetes, Well Controlled, Type 2, Oral Hypoglycemic Agents ? Renal/GU ?Renal InsufficiencyRenal disease (acute prerenal failure)  ?negative genitourinary ?  ?Musculoskeletal ?negative musculoskeletal ROS ?(+)  ? Abdominal ?  ?Peds ?negative pediatric ROS ?(+)  Hematology ? ?(+) Blood dyscrasia, anemia ,   ?Anesthesia Other Findings ?Malignant cachexia  ? Reproductive/Obstetrics ?negative OB ROS ? ?  ? ? ? ? ? ? ? ? ? ? ? ? ? ?  ?  ? ? ? ? ? ? ? ? ?Anesthesia Physical ?Anesthesia Plan ? ?ASA: 4 ? ?Anesthesia Plan: General  ? ?Post-op Pain Management: Minimal or no pain anticipated  ? ?Induction: Intravenous ? ?PONV Risk Score and Plan: TIVA and Propofol infusion ? ?Airway Management Planned: Nasal Cannula and Natural Airway ? ?Additional Equipment:  ? ?Intra-op Plan:  ? ?Post-operative Plan: Possible Post-op intubation/ventilation ? ?Informed Consent: I have reviewed the patients History and Physical, chart, labs and discussed the procedure including the risks, benefits and alternatives for the proposed anesthesia with  the patient or authorized representative who has indicated his/her understanding and acceptance.  ? ? ? ?Dental advisory given ? ?Plan Discussed with: CRNA and Surgeon ? ?Anesthesia Plan Comments:   ? ? ? ? ? ? ?Anesthesia Quick Evaluation ? ?

## 2021-05-22 NOTE — Assessment & Plan Note (Addendum)
-  A1c 8.3 ?-Advised to follow modified carbohydrate diet ?-Resume home hypoglycemic agents. ?

## 2021-05-22 NOTE — Consult Note (Addendum)
? ? ?Gastroenterology Consult  ? ?Referring Provider: No ref. provider found ?Primary Care Physician:  [name], MD ?Primary Gastroenterologist: Dr. Laural Golden ? ?Patient ID: Troy Smith; [id number]; Mar 14, 1955  ? ?Admit date: 05/22/2021 ? LOS: 0 days  ? ?Date of Consultation: 05/22/2021 ? ?Reason for Consultation:  GI Bleed ?  ? ?History of Present Illness  ? ?Troy Smith is a 66 y.o. 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 Delton Coombes, presenting with GI bleeding. He was originally diagnosed with sigmoid colon adenocarcinoma in 03/2016 underwent LAR with transanal anastomosis. He was noted to have metastatic disease to the liver with recurrence in 2020 requiring neoadjuvant chemotherapy, laparoscopic partial hepatectomy, redo low anterior resection December 2020.  Subsequent metastatic disease to the sacrum.   Has underwent XRT to the pelvis last year with Xeloda.  Last chemotherapy in 2020.  Patient also with DM, early dementia. ? ?Patient presented to the ED with complaint of shortness of breath, noted with minimal ambulation.  Patient not able to provide detailed history but wife states that she noted he was significantly short of breath on Tuesday.  Seem to be mostly with exertion.  Had made an appointment to see primary care but last night she noted large amount of melena.  She states his weight has been fairly stable over the past 6 months.  He does not eat very much, only a few bites at a time.  No notable issues swallowing.  No vomiting.  Patient denies heartburn.  He denies abdominal pain but has had sacral pain related to metastatic disease.  He has intermittent diarrhea, usually medicates with Imodium.  No reported fresh blood per rectum.  Wife states that patient had used Aleve off and on for quite some time but over the past 2 months more regularly due to sacral pain.  Denies aspirin products.   ? ?In the ED: Hemoglobin 4.6, had been 9.4 on February 6.   MCV 92, platelets 173,000.  INR 1.0.  Heme positive stool. BUN 34, creatinine 1.42, A1c 8.3.  Patient noted to have large amount of maroon stool, heme positive in the ED.  Prior to my visit he had large amount of black stool.  He has received 2 units of packed red blood cells, posttransfusion H&H pending.  Vital signs have been stable. ? ?Sigmoidoscopy December 2020 with Dr. Jerene Pitch: For purpose of tattooing, op note not available. ? ?Rectal ultrasound with FNA November 2020 by Dr. Jerene Pitch, op note not available.  Pathology showed invasive well to moderately differentiated adenocarcinoma. ? ?Colonoscopy 07/2016: ?- Two small polyps at the splenic flexure and in the transverse colon. Biopsied. ?- Four small polyps in the descending colon and in the transverse colon, removed with a cold snare. Resected and retrieved. ?- Patent end-to-end colo-colonic anastomosis, characterized by healthy appearing mucosa. ?- External hemorrhoids. ? ?No prior upper endoscopy. ?  ?Prior to Admission medications   ?Medication Sig Start Date End Date Taking? Authorizing Provider  ?amLODipine (NORVASC) 10 MG tablet Take 10 mg by mouth daily.    [provider]  ?diphenoxylate-atropine (LOMOTIL) 2.5-0.025 MG tablet TAKE 2 AFTER FIRST ONSET OF DIARRHEA OR LOOSE STOOL, THEN 1 AFTER EACH LOOSE STOOL. 07/31/20   Heath Lark, MD  ?donepezil (ARICEPT) 10 MG tablet TAKE (1) TABLET BY MOUTH AT BEDTIME. 05/14/21   Suzzanne Cloud, NP  ?HYDROcodone-acetaminophen (NORCO/VICODIN) 5-325 MG tablet Take 1 tablet by mouth every 8 (eight) hours as needed for moderate pain. 05/15/21  Derek Jack, MD  ?insulin glargine (LANTUS) 100 UNIT/ML injection Inject 30 Units into the skin at bedtime.     [provider]  ?loperamide (IMODIUM A-D) 2 MG tablet Take 2 at onset of diarrhea, then 1 after each watery bowel movement. 09/18/20   Derek Jack, MD  ?memantine (NAMENDA) 10 MG tablet Take 1 tablet (10 mg total) by mouth 2 (two) times  daily. 05/14/21   Suzzanne Cloud, NP  ?metoprolol succinate (TOPROL-XL) 50 MG 24 hr tablet Take 50 mg by mouth daily. Take with or immediately following a meal.    [provider]  ?potassium chloride SA (KLOR-CON) 20 MEQ tablet Take 1 tablet (20 mEq total) by mouth daily. 09/04/20   Derek Jack, MD  ?Aleve as needed. ? ?Current Facility-Administered Medications  ?Medication Dose Route Frequency Provider Last Rate Last Admin  ? 0.9 %  sodium chloride infusion  10 mL/hr Intravenous Once Zierle-Ghosh, Asia B, DO      ? acetaminophen (TYLENOL) tablet 650 mg  650 mg Oral Q6H PRN Zierle-Ghosh, Asia B, DO      ? Or  ? acetaminophen (TYLENOL) suppository 650 mg  650 mg Rectal Q6H PRN Zierle-Ghosh, Asia B, DO      ? donepezil (ARICEPT) tablet 10 mg  10 mg Oral QHS Zierle-Ghosh, Asia B, DO      ? HYDROcodone-acetaminophen (NORCO/VICODIN) 5-325 MG per tablet 1 tablet  1 tablet Oral Q8H PRN Zierle-Ghosh, Asia B, DO      ? insulin aspart (novoLOG) injection 0-15 Units  0-15 Units Subcutaneous TID WC Zierle-Ghosh, Asia B, DO   3 Units at 05/22/21 0815  ? insulin aspart (novoLOG) injection 0-5 Units  0-5 Units Subcutaneous QHS Zierle-Ghosh, Asia B, DO      ? insulin detemir (LEVEMIR) injection 15 Units  15 Units Subcutaneous QHS Zierle-Ghosh, Asia B, DO      ? memantine (NAMENDA) tablet 10 mg  10 mg Oral BID Zierle-Ghosh, Asia B, DO      ? metoprolol succinate (TOPROL-XL) 24 hr tablet 50 mg  50 mg Oral Daily Zierle-Ghosh, Asia B, DO      ? morphine (PF) 2 MG/ML injection 2 mg  2 mg Intravenous Q2H PRN Zierle-Ghosh, Asia B, DO      ? ondansetron (ZOFRAN) tablet 4 mg  4 mg Oral Q6H PRN Zierle-Ghosh, Asia B, DO      ? Or  ? ondansetron (ZOFRAN) injection 4 mg  4 mg Intravenous Q6H PRN Zierle-Ghosh, Asia B, DO      ? pantoprazole (PROTONIX) injection 40 mg  40 mg Intravenous Q12H Zierle-Ghosh, Asia B, DO   40 mg at 05/22/21 0500  ? ?Current Outpatient Medications  ?Medication Sig Dispense Refill  ? amLODipine (NORVASC)  10 MG tablet Take 10 mg by mouth daily.    ? donepezil (ARICEPT) 10 MG tablet TAKE (1) TABLET BY MOUTH AT BEDTIME. (Patient taking differently: Take 10 mg by mouth at bedtime.) 90 tablet 3  ? HYDROcodone-acetaminophen (NORCO/VICODIN) 5-325 MG tablet Take 1 tablet by mouth every 8 (eight) hours as needed for moderate pain. 30 tablet 0  ? insulin glargine (LANTUS) 100 UNIT/ML injection Inject 30 Units into the skin at bedtime.     ? memantine (NAMENDA) 10 MG tablet Take 1 tablet (10 mg total) by mouth 2 (two) times daily. 180 tablet 3  ? metoprolol succinate (TOPROL-XL) 50 MG 24 hr tablet Take 50 mg by mouth daily. Take with or immediately following a meal.    ?  potassium chloride SA (KLOR-CON) 20 MEQ tablet Take 1 tablet (20 mEq total) by mouth daily. 30 tablet 3  ? diphenoxylate-atropine (LOMOTIL) 2.5-0.025 MG tablet TAKE 2 AFTER FIRST ONSET OF DIARRHEA OR LOOSE STOOL, THEN 1 AFTER EACH LOOSE STOOL. (Patient not taking: Reported on 05/22/2021) 60 tablet 0  ? loperamide (IMODIUM A-D) 2 MG tablet Take 2 at onset of diarrhea, then 1 after each watery bowel movement. (Patient not taking: Reported on 05/22/2021) 100 tablet 1  ? ?Facility-Administered Medications Ordered in Other Encounters  ?Medication Dose Route Frequency Provider Last Rate Last Admin  ? heparin lock flush 100 unit/mL  500 Units Intracatheter Once PRN Derek Jack, MD      ? sodium chloride flush (NS) 0.9 % injection 10 mL  10 mL Intracatheter PRN Derek Jack, MD      ? ? ?Allergies as of 05/22/2021 - Review Complete 05/22/2021  ?Allergen Reaction Noted  ? Penicillin g Nausea And Vomiting 07/29/2018  ? ? ?Past Medical History:  ?Diagnosis Date  ? Adenocarcinoma of sigmoid colon ([place]) 03/23/2016  ? Carpal tunnel syndrome 05/02/2014  ? Bilateral  ? Cervical disc disorder with radiculopathy of cervical region 05/02/2014  ? Left C8  ? Colon cancer ([place]) 06/2020  ? Dementia ([place]) 11/08/2020  ? Diabetes mellitus without complication ([place])   ?  Hypertension   ? Port-A-Cath in place 08/10/2018  ? ? ?Past Surgical History:  ?Procedure Laterality Date  ? appendectomy  2012  ? BIOPSY  03/20/2016  ? Procedure: BIOPSY;  Surgeon: Rogene Houston, MD;  L

## 2021-05-22 NOTE — ED Provider Notes (Signed)
?[facility] ?Provider Note ? ? ?CSN: [id number] ?Arrival date & time: 05/22/21  0054 ? ?  ? ?History ? ?Chief Complaint  ?Patient presents with  ? Shortness of Breath  ? ? ?Troy Smith is a 66 y.o. male. ? ?The history is provided by the patient and the spouse.  ?Shortness of Breath ?He has history of hypertension, diabetes, colon cancer, dementia and comes in because of shortness of breath which started yesterday, rectal bleeding which started today.  He denies any abdominal pain or chest pain.  He denies any fever, chills, sweats.  He denies any cough.  Dyspnea is worse with exertion, not with lying flat.  He denies prior history of rectal bleeding.  He is not currently getting any treatment for his colon cancer. ?  ?Home Medications ?Prior to Admission medications   ?Medication Sig Start Date End Date Taking? Authorizing Provider  ?amLODipine (NORVASC) 10 MG tablet Take 10 mg by mouth daily.    [provider]  ?diphenoxylate-atropine (LOMOTIL) 2.5-0.025 MG tablet TAKE 2 AFTER FIRST ONSET OF DIARRHEA OR LOOSE STOOL, THEN 1 AFTER EACH LOOSE STOOL. 07/31/20   Heath Lark, MD  ?donepezil (ARICEPT) 10 MG tablet TAKE (1) TABLET BY MOUTH AT BEDTIME. 05/14/21   Suzzanne Cloud, NP  ?HYDROcodone-acetaminophen (NORCO/VICODIN) 5-325 MG tablet Take 1 tablet by mouth every 8 (eight) hours as needed for moderate pain. 05/15/21   Derek Jack, MD  ?insulin glargine (LANTUS) 100 UNIT/ML injection Inject 30 Units into the skin at bedtime.     [provider]  ?loperamide (IMODIUM A-D) 2 MG tablet Take 2 at onset of diarrhea, then 1 after each watery bowel movement. 09/18/20   Derek Jack, MD  ?memantine (NAMENDA) 10 MG tablet Take 1 tablet (10 mg total) by mouth 2 (two) times daily. 05/14/21   Suzzanne Cloud, NP  ?metoprolol succinate (TOPROL-XL) 50 MG 24 hr tablet Take 50 mg by mouth daily. Take with or immediately following a meal.    [provider]  ?potassium  chloride SA (KLOR-CON) 20 MEQ tablet Take 1 tablet (20 mEq total) by mouth daily. 09/04/20   Derek Jack, MD  ?   ? ?Allergies    ?Penicillin g   ? ?Review of Systems   ?Review of Systems  ?Respiratory:  Positive for shortness of breath.   ?All other systems reviewed and are negative. ? ?Physical Exam ?Updated Vital Signs ?BP (!) 143/91   Pulse (!) 102   Temp 98.1 ?F (36.7 ?C) (Oral)   Resp (!) 24   Ht '5\' 8"'$  (1.727 m)   Wt 80 kg   SpO2 99%   BMI 26.82 kg/m?  ?Physical Exam ?Vitals and nursing note reviewed.  ?66 year old male, resting comfortably and in no acute distress. Vital signs are significant for borderline elevated blood pressure and borderline elevated heart rate. Oxygen saturation is 99%, which is normal. ?Head is normocephalic and atraumatic. PERRLA, EOMI. Oropharynx is clear.  Conjunctivae are pale. ?Neck is nontender and supple without adenopathy or JVD. ?Back is nontender and there is no CVA tenderness. ?Lungs are clear without rales, wheezes, or rhonchi. ?Chest is nontender. ?Heart has regular rate and rhythm with 2/6 systolic ejection murmur. ?Abdomen is soft, flat, nontender. ?Rectal: Slightly decreased sphincter tone, large amount of maroon stool present which is strongly Hemoccult positive. ?Extremities have 1+ edema, full range of motion is present. ?Skin is warm and dry without rash. ?Neurologic: Awake and oriented and conversant, cranial nerves  are intact, moves all extremities equally. ? ?ED Results / Procedures / Treatments   ?Labs ?(all labs ordered are listed, but only abnormal results are displayed) ?Labs Reviewed  ?CBC WITH DIFFERENTIAL/PLATELET - Abnormal; Notable for the following components:  ?    Result Value  ? RBC 1.63 (*)   ? Hemoglobin 4.6 (*)   ? HCT 15.0 (*)   ? RDW 18.1 (*)   ? nRBC 0.9 (*)   ? Abs Immature Granulocytes 0.33 (*)   ? All other components within normal limits  ?COMPREHENSIVE METABOLIC PANEL - Abnormal; Notable for the following components:  ? CO2  21 (*)   ? Glucose, Bld 242 (*)   ? BUN 34 (*)   ? Creatinine, Ser 1.42 (*)   ? Calcium 8.6 (*)   ? Total Protein 5.1 (*)   ? Albumin 2.7 (*)   ? AST 13 (*)   ? GFR, Estimated 55 (*)   ? All other components within normal limits  ?BRAIN NATRIURETIC PEPTIDE - Abnormal; Notable for the following components:  ? B Natriuretic Peptide 107.0 (*)   ? All other components within normal limits  ?POC OCCULT BLOOD, ED - Abnormal; Notable for the following components:  ? Fecal Occult Bld POSITIVE (*)   ? All other components within normal limits  ?MAGNESIUM  ?PROTIME-INR  ?HEMOGLOBIN A1C  ?HIV ANTIBODY (ROUTINE TESTING W REFLEX)  ?COMPREHENSIVE METABOLIC PANEL  ?MAGNESIUM  ?CBC WITH DIFFERENTIAL/PLATELET  ?BLOOD GAS, VENOUS  ?TYPE AND SCREEN  ?PREPARE RBC (CROSSMATCH)  ?TROPONIN I (HIGH SENSITIVITY)  ?TROPONIN I (HIGH SENSITIVITY)  ? ? ?EKG ?EKG Interpretation ? ?Date/Time:  Thursday May 22 2021 01:09:42 EDT ?Ventricular Rate:  100 ?PR Interval:  169 ?QRS Duration: 85 ?QT Interval:  326 ?QTC Calculation: 421 ?R Axis:   3 ?Text Interpretation: Fast sinus arrhythmia Baseline wander in lead(s) V2 Otherwise within normal limits When compared with ECG of 06/26/2019, No significant change was found Confirmed by Delora Fuel ([id number]) on 05/22/2021 1:16:06 AM ? ?Radiology ?DG Chest Port 1 View ? ?Result Date: 05/22/2021 ?CLINICAL DATA:  Shortness of breath, history of colorectal cancer EXAM: PORTABLE CHEST 1 VIEW COMPARISON:  PET-CT from 04/03/2021 FINDINGS: Cardiac shadow is within normal limits. Left-sided chest wall port is noted in satisfactory position. No focal infiltrate or effusion is noted. Degenerative changes of the thoracic spine are noted. IMPRESSION: No acute abnormality noted. Electronically Signed   By: Inez Catalina M.D.   On: 05/22/2021 02:08   ? ?Procedures ?Procedures  ?Cardiac monitor shows normal sinus rhythm, per my interpretation. ? ?Medications Ordered in ED ?Medications - No data to display ? ?ED Course/ Medical  Decision Making/ A&P ?  ?                        ?Medical Decision Making ?Amount and/or Complexity of Data Reviewed ?Labs: ordered. ?Radiology: ordered. ? ?Risk ?Prescription drug management. ?Decision regarding hospitalization. ? ? ?Rectal bleeding.  Possible sources include, but are not limited to, peptic ulcer disease, gastritis, AV malformation, diverticulosis.  Old records are reviewed confirming history of colon cancer with colon resection.  Colonoscopy in 2018 did not mention diverticulosis, a CT scan of chest, abdomen, pelvis 2 months ago did not mention diverticulosis.  He will clearly need to be admitted for monitoring hemoglobins and transfuse as necessary, GI consultation. ? ?Chest x-ray shows no acute process.  Labs show renal insufficiency with creatinine only slightly increased compared with baseline, BUN moderately  increased compared with baseline.  This could represent an upper GI bleed.  Hemoglobin is markedly low at 4.6.  Blood is ordered for transfusion.  Case is discussed with Dr.  Clearence Ped of [facility], who agrees to admit the patient. ? ?CRITICAL CARE ?Performed by: Delora Fuel ?Total critical care time: 90 minutes ?Critical care time was exclusive of separately billable procedures and treating other patients. ?Critical care was necessary to treat or prevent imminent or life-threatening deterioration. ?Critical care was time spent personally by me on the following activities: development of treatment plan with patient and/or surrogate as well as nursing, discussions with consultants, evaluation of patient's response to treatment, examination of patient, obtaining history from patient or surrogate, ordering and performing treatments and interventions, ordering and review of laboratory studies, ordering and review of radiographic studies, pulse oximetry and re-evaluation of patient's condition. ? ?Final Clinical Impression(s) / ED Diagnoses ?Final diagnoses:  ?Gastrointestinal  hemorrhage, unspecified gastrointestinal hemorrhage type  ?Anemia associated with acute blood loss  ?Shortness of breath  ?Renal insufficiency  ? ? ?Rx / DC Orders ?ED Discharge Orders   ? ? None  ? ?  ? ? ?  ?Roxanne Mins,

## 2021-05-22 NOTE — Progress Notes (Signed)
?   05/22/21 2217  ?Vitals  ?Temp (!) 101.4 ?F (38.6 ?C)  ?Temp Source Oral  ?MEWS COLOR  ?MEWS Score Color Yellow  ?MEWS Score  ?MEWS Temp 1  ?MEWS Systolic 0  ?MEWS Pulse 1  ?MEWS RR 1  ?MEWS LOC 0  ?MEWS Score 3  ? ? ?Patient felt hot/clammy. Temperature checked. Tylenol given. Will continue to monitor.  ?

## 2021-05-22 NOTE — Progress Notes (Signed)
Patient seen and examined. Admitted after midnight secondary to weakness and SOB (especially with exertion). Found to have a Hgb 4.6 and positive FOBT. Vital signs are stable overall and patient denies CP. Status post colon cancer status post resection in 2020; no prior hx of transfusion in the past. Please refer to H&P written by Dr. Carmin Muskrat for further info/details on admission. ? ?Plan: ?-follow Hgb trend and further transfuse as needed  ?-follow GI service recommendations ?-continue supportive care. ? ?Barton Dubois MD ?[phone] ? ?

## 2021-05-22 NOTE — TOC Progression Note (Signed)
?  Transition of Care (TOC) Screening Note ? ? ?Patient Details  ?Name: Troy Smith ?Date of Birth: Apr 04, 1955 ? ? ?Transition of Care (TOC) CM/SW Contact:    ?Boneta Lucks, RN ?Phone Number: ?05/22/2021, 1:14 PM ? ? ? ?Transition of Care Department [facility]) has reviewed patient and no TOC needs have been identified at this time. We will continue to monitor patient advancement through interdisciplinary progression rounds. If new patient transition needs arise, please place a TOC consult. ? ? ? ? ?  ?Barriers to Discharge: Continued Medical Work up ? ?

## 2021-05-22 NOTE — Transfer of Care (Signed)
Immediate Anesthesia Transfer of Care Note ? ?Patient: Troy Smith ? ?Procedure(s) Performed: ESOPHAGOGASTRODUODENOSCOPY (EGD) WITH PROPOFOL ? ?Patient Location: PACU ? ?Anesthesia Type:General ? ?Level of Consciousness: drowsy and patient cooperative ? ?Airway & Oxygen Therapy: Patient Spontanous Breathing ? ?Post-op Assessment: Report given to RN and Post -op Vital signs reviewed and stable ? ?Post vital signs: Reviewed and stable ? ?Last Vitals:  ?Vitals Value Taken Time  ?BP 105/69 1453  ?Temp 98 1453  ?Pulse 83 1453  ?Resp 16 1453  ?SpO2 98 1453  ? ? ?Last Pain:  ?Vitals:  ? 05/22/21 1428  ?TempSrc:   ?PainSc: 0-No pain  ?   ? ?  ? ?Complications: No notable events documented. ?

## 2021-05-22 NOTE — ED Notes (Signed)
Patient to same day surgery at this time.  ?

## 2021-05-22 NOTE — Anesthesia Postprocedure Evaluation (Signed)
Anesthesia Post Note ? ?Patient: XADEN KAUFMAN ? ?Procedure(s) Performed: ESOPHAGOGASTRODUODENOSCOPY (EGD) WITH PROPOFOL ? ?Patient location during evaluation: PACU ?Anesthesia Type: General ?Level of consciousness: awake and alert and oriented ?Pain management: pain level controlled ?Vital Signs Assessment: post-procedure vital signs reviewed and stable ?Respiratory status: spontaneous breathing, nonlabored ventilation and respiratory function stable ?Cardiovascular status: blood pressure returned to baseline and stable ?Postop Assessment: no apparent nausea or vomiting ?Anesthetic complications: no ? ? ?No notable events documented. ? ? ?Last Vitals:  ?Vitals:  ? 05/22/21 1415 05/22/21 1451  ?BP: (!) 156/90 105/69  ?Pulse: 82 84  ?Resp: 18 17  ?Temp: 37 ?C 36.7 ?C  ?SpO2: 100% 99%  ?  ?Last Pain:  ?Vitals:  ? 05/22/21 1451  ?TempSrc:   ?PainSc: Asleep  ? ? ?  ?  ?  ?  ?  ?  ? ?Neev Mcmains C Kaiyon Hynes ? ? ? ? ?

## 2021-05-22 NOTE — Progress Notes (Signed)
Brief EGD note. ? ?Normal esophageal mucosa. ?3 cm sliding hiatal hernia. ?3 ulcers at gastric body along the greater curvature.  1 with stigmata of bleed.  1 Hemoclip applied. ?2 bulbar ulcers.  Both with clean base.  Bulbar ulcer along posterior wall was about 2 cm in diameter. ?Post bulbar mucosa normal. ?

## 2021-05-22 NOTE — H&P (Signed)
?History and Physical  ? ? ?Patient: Troy Smith HKV:[id number] DOB: 1956/01/29 ?DOA: 05/22/2021 ?DOS: the patient was seen and examined on 05/22/2021 ?PCP: [name], MD  ?Patient coming from: Home ? ?Chief Complaint:  ?Chief Complaint  ?Patient presents with  ? Shortness of Breath  ? ?HPI: Troy Smith is a 66 y.o. male with medical history significant of adenocarcinoma of the sigmoid colon, Port-A-Cath in place, dementia, diabetes mellitus, hypertension, and more presents ED with a chief complaint of dyspnea and fatigue.  Patient reports that started about 3 days ago.  Been constant, and is not getting progressively worse.  Patient reports no associated cough, chest pain, dizziness, paresthesias.  He denies melena, hematochezia, epistaxis, bleeding from gums, large bruises.  Patient has never had a blood transfusion in the past.  He denies any pain or fever.  Patient reports his dyspnea is worse with exertion, better with rest.  He has no other complaints at this time. ? ?Patient does not smoke, does not drink alcohol, does not use illicit drugs.  He is vaccinated for COVID.  Patient is full code. ?Review of Systems: As mentioned in the history of present illness. All other systems reviewed and are negative. ?Past Medical History:  ?Diagnosis Date  ? Adenocarcinoma of sigmoid colon ([place]) 03/23/2016  ? Carpal tunnel syndrome 05/02/2014  ? Bilateral  ? Cervical disc disorder with radiculopathy of cervical region 05/02/2014  ? Left C8  ? Colon cancer ([place]) 06/2020  ? Dementia ([place]) 11/08/2020  ? Diabetes mellitus without complication ([place])   ? Hypertension   ? Port-A-Cath in place 08/10/2018  ? ?Past Surgical History:  ?Procedure Laterality Date  ? BIOPSY  03/20/2016  ? Procedure: BIOPSY;  Surgeon: Rogene Houston, MD;  Location: [facility];  Service: Endoscopy;;  colon  ? BOWEL RESECTION N/A 03/23/2016  ? Procedure: LOW ANTERIOR RESECTION;  Surgeon: Aviva Signs, MD;  Location: [facility] OR[facility];  Service: General;   Laterality: N/A;  ? COLON SURGERY  01/2019  ? Done at [facility]  ? COLONOSCOPY N/A 03/20/2016  ? Procedure: COLONOSCOPY;  Surgeon: Rogene Houston, MD;  Location: [facility];  Service: Endoscopy;  Laterality: N/A;  730  ? COLONOSCOPY N/A 07/10/2016  ? Procedure: COLONOSCOPY;  Surgeon: Rogene Houston, MD;  Location: [facility];  Service: Endoscopy;  Laterality: N/A;  1040  ? NO PAST SURGERIES    ? POLYPECTOMY  07/10/2016  ? Procedure: POLYPECTOMY;  Surgeon: Rogene Houston, MD;  Location: [facility];  Service: Endoscopy;;  colon  ? PORTACATH PLACEMENT Left 08/05/2018  ? Procedure: INSERTION PORT-A-CATH;  Surgeon: Aviva Signs, MD;  Location: [facility];  Service: General;  Laterality: Left;  ? TONSILLECTOMY    ? ?Social History:  reports that he has never smoked. He has never used smokeless tobacco. He reports current alcohol use. He reports that he does not use drugs. ? ?Allergies  ?Allergen Reactions  ? Penicillin G Nausea And Vomiting  ? ? ?Family History  ?Problem Relation Age of Onset  ? COPD Mother   ? Melanoma Father   ? ? ?Prior to Admission medications   ?Medication Sig Start Date End Date Taking? Authorizing Provider  ?amLODipine (NORVASC) 10 MG tablet Take 10 mg by mouth daily.    [provider]  ?diphenoxylate-atropine (LOMOTIL) 2.5-0.025 MG tablet TAKE 2 AFTER FIRST ONSET OF DIARRHEA OR LOOSE STOOL, THEN 1 AFTER EACH LOOSE STOOL. 07/31/20   Heath Lark, MD  ?donepezil (ARICEPT) 10  MG tablet TAKE (1) TABLET BY MOUTH AT BEDTIME. 05/14/21   Suzzanne Cloud, NP  ?HYDROcodone-acetaminophen (NORCO/VICODIN) 5-325 MG tablet Take 1 tablet by mouth every 8 (eight) hours as needed for moderate pain. 05/15/21   Derek Jack, MD  ?insulin glargine (LANTUS) 100 UNIT/ML injection Inject 30 Units into the skin at bedtime.     [provider]  ?loperamide (IMODIUM A-D) 2 MG tablet Take 2 at onset of diarrhea, then 1 after each watery bowel movement. 09/18/20   Derek Jack, MD  ?memantine  (NAMENDA) 10 MG tablet Take 1 tablet (10 mg total) by mouth 2 (two) times daily. 05/14/21   Suzzanne Cloud, NP  ?metoprolol succinate (TOPROL-XL) 50 MG 24 hr tablet Take 50 mg by mouth daily. Take with or immediately following a meal.    [provider]  ?potassium chloride SA (KLOR-CON) 20 MEQ tablet Take 1 tablet (20 mEq total) by mouth daily. 09/04/20   Derek Jack, MD  ? ? ?Physical Exam: ?Vitals:  ? 05/22/21 0400 05/22/21 0440 05/22/21 0502 05/22/21 0518  ?BP: (!) 152/78 (!) 146/89 (!) 151/87 (!) 144/78  ?Pulse: 83 81 81 74  ?Resp: '18 18 18 17  '$ ?Temp:  98.4 ?F (36.9 ?C) 98.1 ?F (36.7 ?C) 98.3 ?F (36.8 ?C)  ?TempSrc:   Oral Oral  ?SpO2: 93% 93% 100% 97%  ?Weight:      ?Height:      ? ?1.  General: ?Patient lying supine in bed,  no acute distress ?  ?2. Psychiatric: ?Alert and oriented x 3, mood and behavior normal for situation, pleasant and cooperative with exam ?  ?3. Neurologic: ?Speech and language are normal, face is symmetric, moves all 4 extremities voluntarily, at baseline without acute deficits on limited exam ?  ?4. HEENMT:  ?Head is atraumatic, normocephalic, pupils reactive to light, neck is supple, trachea is midline, mucous membranes are moist ?  ?5. Respiratory : ?Lungs are clear to auscultation bilaterally without wheezing, rhonchi, rales, no cyanosis, no increase in work of breathing or accessory muscle use ?  ?6. Cardiovascular : ?Heart rate normal, rhythm is regular, murmur present, rubs or gallops, no peripheral edema, peripheral pulses palpated ?  ?7. Gastrointestinal:  ?Abdomen is soft, nondistended, nontender to palpation bowel sounds active, no masses or organomegaly palpated ?  ?8. Skin:  ?Skin is warm, dry and intact without rashes, acute lesions, or ulcers on limited exam ?  ?9.Musculoskeletal:  ?No acute deformities or trauma, no asymmetry in tone, no peripheral edema, peripheral pulses palpated, no tenderness to palpation in the extremities ? ?Data Reviewed: ?In the  ED ?Temp 98.3, heart rate 63-102, respiratory rate 18-24, blood pressure 132/86, satting at 98% ?No leukocytosis with a white blood cell count of 8.1, hemoglobin 4.6, platelets 173 ?Chemistry reveals a slight bump in creatinine at 1.42 up from 1.3 ?Decreased bicarb 21 -likely secondary to some lactic acidosis secondary to severe anemia ?Hyperglycemia 242 ?FOBT positive with maroon stool per ED provider report ?Chest x-ray shows nothing acute ?EKG shows a heart rate of 100, QTc 421 ?Troponin pending ?Admission requested for further management and work-up of symptomatic anemia ?Assessment and Plan: ?* Symptomatic anemia ?- Hemoglobin 4.6 ?-Dyspnea, fatigue ?-2 units of blood ordered in the ED with 2 on standby ?-Trend CBC after second unit ?-Continue to monitor ? ?GI bleed ?- FOBT positive ?-Hemoglobin down to 4.6 ?-N.p.o. ?-Protonix twice daily ?-Consult GI ? ?Type 2 diabetes mellitus (HCC) ?- 30 units of basal insulin at home ?-Reduce  to 15 units basal insulin here ?-Currently n.p.o. ?-Sliding scale coverage every 8 hours ?-Update hemoglobin A1c ?-Continue to monitor ? ?Dementia ([place]) ?Continue Aricept and Namenda ? ? ?Essential hypertension ?- Holding antihypertensives in the setting of GI bleed ?-Continue to monitor ? ? ? ? ? Advance Care Planning:   Code Status: Full Code  ? ?Consults: GI ? ?Family Communication: Wife at bedside ? ?Severity of Illness: ?The appropriate patient status for this patient is INPATIENT. Inpatient status is judged to be reasonable and necessary in order to provide the required intensity of service to ensure the patient's safety. The patient's presenting symptoms, physical exam findings, and initial radiographic and laboratory data in the context of their chronic comorbidities is felt to place them at high risk for further clinical deterioration. Furthermore, it is not anticipated that the patient will be medically stable for discharge from the hospital within 2 midnights of admission.   ? ?* I certify that at the point of admission it is my clinical judgment that the patient will require inpatient hospital care spanning beyond 2 midnights from the point of admission due to high intens

## 2021-05-22 NOTE — ED Triage Notes (Signed)
Pt c/o sob since yesterday and rectal bleeding since yesterday.  ?

## 2021-05-22 NOTE — ED Notes (Signed)
Wife states that PA verbalized to her that patient possibly having procedure today, PO meds held at this time.  ?

## 2021-05-22 NOTE — Progress Notes (Signed)
Patient having blood and stool from rectum. MD Zierle-Ghosh notified. Order for STAT CBC. Blood drawn from port and given to lab. Results told to MD. Will continue to monitor.  ?

## 2021-05-23 ENCOUNTER — Encounter (HOSPITAL_COMMUNITY): Payer: Self-pay | Admitting: Internal Medicine

## 2021-05-23 DIAGNOSIS — K264 Chronic or unspecified duodenal ulcer with hemorrhage: Secondary | ICD-10-CM

## 2021-05-23 DIAGNOSIS — G300 Alzheimer's disease with early onset: Secondary | ICD-10-CM | POA: Diagnosis not present

## 2021-05-23 DIAGNOSIS — I10 Essential (primary) hypertension: Secondary | ICD-10-CM

## 2021-05-23 DIAGNOSIS — K921 Melena: Secondary | ICD-10-CM

## 2021-05-23 DIAGNOSIS — K922 Gastrointestinal hemorrhage, unspecified: Secondary | ICD-10-CM

## 2021-05-23 DIAGNOSIS — D649 Anemia, unspecified: Secondary | ICD-10-CM | POA: Diagnosis not present

## 2021-05-23 DIAGNOSIS — F02B0 Dementia in other diseases classified elsewhere, moderate, without behavioral disturbance, psychotic disturbance, mood disturbance, and anxiety: Secondary | ICD-10-CM

## 2021-05-23 LAB — BPAM RBC
Blood Product Expiration Date: 202304252359
Blood Product Expiration Date: 202304252359
Blood Product Expiration Date: 202305172359
ISSUE DATE / TIME: 202304130233
ISSUE DATE / TIME: 202304130454
ISSUE DATE / TIME: 202304131149
Unit Type and Rh: 6200
Unit Type and Rh: 6200
Unit Type and Rh: 6200

## 2021-05-23 LAB — TYPE AND SCREEN
ABO/RH(D): A POS
Antibody Screen: NEGATIVE
Unit division: 0
Unit division: 0
Unit division: 0

## 2021-05-23 LAB — GLUCOSE, CAPILLARY
Glucose-Capillary: 104 mg/dL — ABNORMAL HIGH (ref 70–99)
Glucose-Capillary: 138 mg/dL — ABNORMAL HIGH (ref 70–99)
Glucose-Capillary: 196 mg/dL — ABNORMAL HIGH (ref 70–99)
Glucose-Capillary: 82 mg/dL (ref 70–99)
Glucose-Capillary: 84 mg/dL (ref 70–99)

## 2021-05-23 LAB — CBC WITH DIFFERENTIAL/PLATELET
Abs Immature Granulocytes: 0.15 10*3/uL — ABNORMAL HIGH (ref 0.00–0.07)
Basophils Absolute: 0 10*3/uL (ref 0.0–0.1)
Basophils Relative: 0 %
Eosinophils Absolute: 0 10*3/uL (ref 0.0–0.5)
Eosinophils Relative: 0 %
HCT: 23.9 % — ABNORMAL LOW (ref 39.0–52.0)
Hemoglobin: 7.6 g/dL — ABNORMAL LOW (ref 13.0–17.0)
Immature Granulocytes: 3 %
Lymphocytes Relative: 2 %
Lymphs Abs: 0.1 10*3/uL — ABNORMAL LOW (ref 0.7–4.0)
MCH: 28 pg (ref 26.0–34.0)
MCHC: 31.8 g/dL (ref 30.0–36.0)
MCV: 88.2 fL (ref 80.0–100.0)
Monocytes Absolute: 0.3 10*3/uL (ref 0.1–1.0)
Monocytes Relative: 4 %
Neutro Abs: 5.5 10*3/uL (ref 1.7–7.7)
Neutrophils Relative %: 91 %
Platelets: 146 10*3/uL — ABNORMAL LOW (ref 150–400)
RBC: 2.71 MIL/uL — ABNORMAL LOW (ref 4.22–5.81)
RDW: 16.9 % — ABNORMAL HIGH (ref 11.5–15.5)
WBC: 6.1 10*3/uL (ref 4.0–10.5)
nRBC: 0.3 % — ABNORMAL HIGH (ref 0.0–0.2)

## 2021-05-23 LAB — CBC
HCT: 24 % — ABNORMAL LOW (ref 39.0–52.0)
Hemoglobin: 7.6 g/dL — ABNORMAL LOW (ref 13.0–17.0)
MCH: 28.9 pg (ref 26.0–34.0)
MCHC: 31.7 g/dL (ref 30.0–36.0)
MCV: 91.3 fL (ref 80.0–100.0)
Platelets: 141 10*3/uL — ABNORMAL LOW (ref 150–400)
RBC: 2.63 MIL/uL — ABNORMAL LOW (ref 4.22–5.81)
RDW: 17.7 % — ABNORMAL HIGH (ref 11.5–15.5)
WBC: 5.3 10*3/uL (ref 4.0–10.5)
nRBC: 0 % (ref 0.0–0.2)

## 2021-05-23 MED ORDER — LORAZEPAM 2 MG/ML IJ SOLN
1.0000 mg | Freq: Once | INTRAMUSCULAR | Status: AC
  Administered 2021-05-23: 1 mg via INTRAVENOUS
  Filled 2021-05-23: qty 1

## 2021-05-23 NOTE — Progress Notes (Signed)
? ?Gastroenterology Progress Note  ? ?Referring Provider: No ref. provider found ?Primary Care Physician:  [name], MD ?Primary Gastroenterologist:  Dr. Jerene Pitch ? ?Patient ID: Troy Smith; [id number]; 02-22-1955  ? ? ?Subjective  ? ?Patient denies any abdominal pain, N/V. Denies dysphagia, heartburn, shortness of breath. He reports feeling much better than yesterday. He states he is excited that it is a beautiful day outside. He reports he has had some bowel movements but does not believe it has had blood in it.  Nursing notes moderate amount of melanotic bleeding and multiple stools today.  ? ?Objective  ? ?Vital signs in last 24 hours ?Temp:  [98 ?F (36.7 ?C)-101.4 ?F (38.6 ?C)] 99.6 ?F (37.6 ?C) (04/14 1225) ?Pulse Rate:  [82-104] 97 (04/14 1225) ?Resp:  [15-21] 18 (04/14 1225) ?BP: (105-156)/(56-97) 121/75 (04/14 1225) ?SpO2:  [93 %-100 %] 100 % (04/14 1225) ?Last BM Date : 05/23/21 ? ?Physical Exam ?General:   Alert and oriented x2, pleasant ?Head:  Normocephalic and atraumatic. ?Eyes:  No icterus, sclera clear. Conjuctiva pink.  ?Neck:  Supple, without thyromegaly or masses.  ?Heart:  S1, S2 present, no murmurs noted.  ?Lungs: Clear to auscultation bilaterally, without wheezing, rales, or rhonchi.  ?Abdomen:  Bowel sounds present, soft, non-tender, non-distended. No HSM or hernias noted. No rebound or guarding. No masses appreciated  ?Extremities:  Without clubbing or edema. ?Neurologic:  Alert and  oriented x4;  grossly normal neurologically. ?Skin:  Warm and dry, intact without significant lesions.  ?Psych:  Alert and cooperative. Normal mood and affect. ? ?Intake/Output from previous day: ?04/13 0701 - 04/14 0700 ?In: 1985.8 [P.O.:100; I.V.:983.8; Blood:902] ?Out: -  ?Intake/Output this shift: ?Total I/O ?In: 240 [P.O.:240] ?Out: -  ? ?Lab Results ? ?Recent Labs  ?  05/22/21 ?0955 05/22/21 ?1731 05/22/21 ?2215 05/23/21 ?0246  ?WBC 7.4  --  5.9 6.1  ?HGB 7.0* 8.0* 8.1* 7.6*  ?HCT 21.6* 24.0* 24.4*  23.9*  ?PLT 167  --  152 146*  ? ?BMET ?Recent Labs  ?  05/22/21 ?0135 05/22/21 ?0955  ?NA 139 142  ?K 3.8 4.1  ?CL 109 110  ?CO2 21* 24  ?GLUCOSE 242* 102*  ?BUN 34* 30*  ?CREATININE 1.42* 1.19  ?CALCIUM 8.6* 9.1  ? ?LFT ?Recent Labs  ?  05/22/21 ?0135 05/22/21 ?0955  ?PROT 5.1* 5.5*  ?ALBUMIN 2.7* 2.9*  ?AST 13* 13*  ?ALT 11 11  ?ALKPHOS 112 124  ?BILITOT 0.6 1.1  ? ?PT/INR ?Recent Labs  ?  05/22/21 ?0610  ?LABPROT 13.4  ?INR 1.0  ? ?Hepatitis Panel ?No results for input(s): HEPBSAG, HCVAB, HEPAIGM, HEPBIGM in the last 72 hours. ? ? ?Studies/Results ?DG Chest Port 1 View ? ?Result Date: 05/22/2021 ?CLINICAL DATA:  Shortness of breath, history of colorectal cancer EXAM: PORTABLE CHEST 1 VIEW COMPARISON:  PET-CT from 04/03/2021 FINDINGS: Cardiac shadow is within normal limits. Left-sided chest wall port is noted in satisfactory position. No focal infiltrate or effusion is noted. Degenerative changes of the thoracic spine are noted. IMPRESSION: No acute abnormality noted. Electronically Signed   By: Inez Catalina M.D.   On: 05/22/2021 02:08   ? ?Assessment  ?66 y.o. male with a history of metastatic colon cancer with recurrence followed by Dr. Delton Coombes, diabetes, dementia who presented to the ED with acute onset dyspnea on exertion GI bleeding.  Originally diagnosed with sigmoid colon adenocarcinoma in February 2018 and underwent LAR with transanal anastomosis.  He was noted to have metastatic disease to the liver  with recurrence in 2020 requiring neoadjuvant chemotherapy, laparoscopic partial hepatectomy, redo low anterior resection in December 2020.  Subsequent metastatic disease to the sacrum, has underwent XRT to the pelvis last year with Xeloda, last chemotherapy in 2020.  ? ?Symptomatic normocytic anemia/GI bleed: In February hemoglobin 9.4, was 4.6 on presentation now s/p 3 units PRBCs.  MCV normal, BUN elevated.  Patient presented with dyspnea on exertion with minimal activity.  Wife noted large melanotic  stool Wednesday evening and reports intermittent diarrhea that she medicates with Imodium.  Had denied any bright red blood per rectum.  Has used Aleve off-and-on for quite some time however frequency has increased over the last 2 months due to sacral pain.  No prior upper endoscopies.  Last colonoscopy June 2018 with 2 small polyps at the splenic flexure and transverse colon, 4 small polyps in the descending and transverse colon, patent end-to-end colocolonic anastomosis with healthy-appearing mucosa and external hemorrhoids.  He underwent EGD 4/13 with normal esophagus, 3 cm hiatal hernia, nonbleeding gastric ulcer with adherent clot s/p clip placement, nonbleeding duodenal ulcer with no stigmata of bleeding.  Hemoglobin improved to 8.1 yesterday, slightly down trended to 7.6 this morning.  He has ongoing bowel movements reported as melanotic or dark red with multiple this morning. Symptomatic anemia due to peptic ulcer disease secondary to NSAID use.Will continue pantoprazole infusion for total of 72 hours then may transition to IV PPI twice daily, continue Carafate 1 g 3 times daily with meals and at bedtime, and will repeat EGD in 10 weeks.  We will follow-up H. pylori serology.  Continue to monitor H&H, transfuse for hemoglobin less than 7. Due to multiple bowel movements today will recheck CBC this afternoon.  ? ? ?Plan / Recommendations  ?Avoid all NSAID containing products including aspirin, ibuprofen, naproxen ?Repeat EGD in 10 weeks ?Continue pantoprazole infusion for total of 72 hours, then transition to IV PPI 40 mg twice daily ?Continue Carafate 1 g 3 times daily with meals and at bedtime ?CBC this afternoon ?Trend H/H, transfuse for Hgb < 7 ?Follow-up H. pylori serology ?Continue full liquid diet ? ? ? ? LOS: 1 day  ? ? 05/23/2021, 12:35 PM ? ? ?Venetia Night, MSN, FNP-BC, AGACNP-BC ?[facility] ? ? ?

## 2021-05-23 NOTE — Progress Notes (Signed)
?Progress Note ? ? ?Patient: CASWELL ALVILLAR PPI:[id number] DOB: September 09, 1955 DOA: 05/22/2021     1 ?DOS: the patient was seen and examined on 05/23/2021 ?  ?Brief hospital course: ?As per H&P written by Dr. Clearence Ped on 05/22/21 ?RONELL DUFFUS is a 66 y.o. male with medical history significant of adenocarcinoma of the sigmoid colon, Port-A-Cath in place, dementia, diabetes mellitus, hypertension, and more presents ED with a chief complaint of dyspnea and fatigue.  Patient reports that started about 3 days ago.  Been constant, and is not getting progressively worse.  Patient reports no associated cough, chest pain, dizziness, paresthesias.  He denies melena, hematochezia, epistaxis, bleeding from gums, large bruises.  Patient has never had a blood transfusion in the past.  He denies any pain or fever.  Patient reports his dyspnea is worse with exertion, better with rest.  He has no other complaints at this time. ?  ?Patient does not smoke, does not drink alcohol, does not use illicit drugs.  He is vaccinated for COVID.  Patient is full code. ? ?Assessment and Plan: ?* Symptomatic anemia ?- Hemoglobin 4.6 ?-In the setting of upper GI bleed due to NSAIDs usage. ?-Patient received 3 units of PRBCs on 05/22/2021; reports improvement in his dyspnea feelings. ?-Patient with good oxygen saturation on room air and expressing no chest pain. ?-Wife reported he still ongoing melanotic loose stools episodes. ?-Endoscopic evaluation on 05/22/2021 demonstrated positive for multiple stomach and duodenal ulcers without active bleeding. ?-Recommendations to continue PPI infusion for a total of 72 hours along with Carafate.  ?-Continue liquid diet ?-Continue to follow hemoglobin trend.   ?-Hemoglobin currently 7.6. ? ?GI bleed ?-Upper GI bleed in the setting of stomach and duodenal ulcers due to NSAID usage. ?-Status post endoscopic evaluation by GI service ?-Continue IV PPI and carafate  ? ?Type 2 diabetes mellitus ([place]) ?-Continue  sliding scale insulin and long-acting. ?-Allowed to have clear liquid diet. ?-A1c 8.3 ? ?Dementia ([place]) ?-Continue Aricept and Namenda ?-Continue constant reorientation. ? ? ?Melena ?- In the setting of upper GI bleed ?-Endoscopic evaluation not demonstrating active bleeding; melanotic stools could be due to still in transit blotting his intestines. ?-Continue to follow hemoglobin trend; further transfuse as needed. ?-Will follow GI service recommendations. ? ?Essential hypertension ?-Continue holding antihypertensives in the setting of GI bleed ?-Follow-up vital signs. ? ?Hx of colon cancer: ?-continue outpatient follow-up with oncology service as an outpatient. ?-Status post surgical resection in 2020. ? ?Elevated troponin ?-In the setting of demand ischemia from anemia ?-No chest pain, no shortness of breath.   ?-EKG and telemetry without ischemic changes. ? ? ?Subjective:  ?Afebrile, no nausea or vomiting.  Continues to experience loose melanotic stools.  Denies chest pain or shortness of breath. ? ?Physical Exam: ?Vitals:  ? 05/23/21 0050 05/23/21 0058 05/23/21 0515 05/23/21 1225  ?BP: (!) 117/56 (!) 117/56 135/88 121/75  ?Pulse: (!) 104 (!) 102 91 97  ?Resp:  '17 18 18  '$ ?Temp: 98 ?F (36.7 ?C) 98 ?F (36.7 ?C) 99.1 ?F (37.3 ?C) 99.6 ?F (37.6 ?C)  ?TempSrc: Oral Oral Axillary Oral  ?SpO2: 97% 97% 97% 100%  ?Weight:      ?Height:      ? ?General exam: Alert, awake, oriented x 2; intermittently confused but able to follow simple commands. ?Respiratory system: Clear to auscultation. Respiratory effort normal.  Good saturation on room air.  No using accessory muscles. ?Cardiovascular system:RRR. No rubs or gallops; no JVD. ?Gastrointestinal system: Abdomen is nondistended, soft  and nontender. No organomegaly or masses felt. Normal bowel sounds heard. ?Central nervous system: Alert and oriented. No focal neurological deficits. ?Extremities: No cyanosis clubbing. ?Skin: No petechiae. ?Psychiatry: Judgement and insight  appear normal. Mood & affect appropriate.  ? ? ?Data Reviewed: ?CBC with hemoglobin of 7.6;  WBC 6.1, platelet count 146 K. ?A1c 8.3 ?H. pylori antibody IgG pending. ? ?Family Communication: Wife at bedside. ? ?Disposition: ?Status is: Inpatient ?Remains inpatient appropriate because: Needing PPI infusion for 72 hours based on endoscopic findings.  High risk for rebleeding.  Continue to follow hemoglobin trend and transfuse as needed. ? ? Planned Discharge Destination: Home ? ?Author: ?Barton Dubois, MD ?05/23/2021 3:00 PM ? ?For on call review [email or web address].  ?

## 2021-05-23 NOTE — Progress Notes (Signed)
Patient having moderate amount of blood only coming from rectum. MD Zierle-Ghosh notified.  ?

## 2021-05-23 NOTE — Progress Notes (Signed)
Patient continues to have rectal bleeding.  ?

## 2021-05-23 NOTE — Assessment & Plan Note (Addendum)
-  In the setting of upper GI bleed ?-Endoscopic evaluation demonstrated not active bleeding; melanotic stools could be due to still in transit blood in his intestines. ?-Continue to follow hemoglobin trend; further transfuse as needed. ?-We will follow-up with GI service as an outpatient. ?

## 2021-05-24 ENCOUNTER — Telehealth: Payer: Self-pay | Admitting: Internal Medicine

## 2021-05-24 DIAGNOSIS — E1169 Type 2 diabetes mellitus with other specified complication: Secondary | ICD-10-CM

## 2021-05-24 DIAGNOSIS — Z794 Long term (current) use of insulin: Secondary | ICD-10-CM

## 2021-05-24 DIAGNOSIS — D62 Acute posthemorrhagic anemia: Secondary | ICD-10-CM

## 2021-05-24 LAB — CBC
HCT: 26.3 % — ABNORMAL LOW (ref 39.0–52.0)
Hemoglobin: 8.1 g/dL — ABNORMAL LOW (ref 13.0–17.0)
MCH: 27.9 pg (ref 26.0–34.0)
MCHC: 30.8 g/dL (ref 30.0–36.0)
MCV: 90.7 fL (ref 80.0–100.0)
Platelets: 169 10*3/uL (ref 150–400)
RBC: 2.9 MIL/uL — ABNORMAL LOW (ref 4.22–5.81)
RDW: 17.5 % — ABNORMAL HIGH (ref 11.5–15.5)
WBC: 8.2 10*3/uL (ref 4.0–10.5)
nRBC: 0 % (ref 0.0–0.2)

## 2021-05-24 LAB — GLUCOSE, CAPILLARY
Glucose-Capillary: 56 mg/dL — ABNORMAL LOW (ref 70–99)
Glucose-Capillary: 149 mg/dL — ABNORMAL HIGH (ref 70–99)
Glucose-Capillary: 112 mg/dL — ABNORMAL HIGH (ref 70–99)

## 2021-05-24 MED ORDER — PANTOPRAZOLE SODIUM 40 MG PO TBEC: 40.0000 mg | DELAYED_RELEASE_TABLET | Freq: Two times a day (BID) | ORAL | 2 refills | Status: AC

## 2021-05-24 MED ORDER — HEPARIN SOD (PORK) LOCK FLUSH 100 UNIT/ML IV SOLN
500.0000 [IU] | Freq: Once | INTRAVENOUS | Status: DC
  Filled 2021-05-24: qty 5

## 2021-05-24 MED ORDER — SUCRALFATE 1 GM/10ML PO SUSP
1.0000 g | Freq: Three times a day (TID) | ORAL | 0 refills | Status: AC
Start: 2021-05-24 — End: 2021-06-03

## 2021-05-24 NOTE — Assessment & Plan Note (Signed)
-   In the setting of upper GI bleed due to NSAIDs usage. ?-Stabilized with 3 units of PRBC transfusion during hospitalization ?-Hemoglobin 8.6 at discharge ?-No active bleeding appreciated from gastric and duodenal ulcers during endoscopic evaluation. ?-Patient advised to avoid the use of NSAIDs. ?-Safe to go home and follow-up as an outpatient with GI service ?

## 2021-05-24 NOTE — Telephone Encounter (Signed)
Please arrange hospital follow-up visit for this patient at [place].  Needs repeat EGD in 10 to 12 weeks.  Thank you ?

## 2021-05-24 NOTE — Discharge Summary (Signed)
?Physician Discharge Summary ?  ?Patient: Troy Smith MRN: [id number] DOB: [birth year]  ?Admit date:     05/22/2021  ?Discharge date: 05/24/21  ?Discharge Physician: Barton Dubois  ? ?PCP: [name], MD  ? ?Recommendations at discharge:  ?Repeat CBC to follow hemoglobin trend and stability ?Reassess blood pressure and adjust antihypertensive treatment as needed ?Make sure patient follow-up with gastroenterology service as instructed ?Repeat basic metabolic panel to follow electrolytes and renal function. ? ?Discharge Diagnoses: ?Principal Problem: ?  Symptomatic anemia ?Active Problems: ?  Essential hypertension ?  Melena ?  Dementia ([place]) ?  Type 2 diabetes mellitus ([place]) ?  GI bleed ?  Anemia associated with acute blood loss ? ?Brief hospital course: ?As per H&P written by Dr. Clearence Ped on 05/22/21 ?DORAN NESTLE is a 66 y.o. male with medical history significant of adenocarcinoma of the sigmoid colon, Port-A-Cath in place, dementia, diabetes mellitus, hypertension, and more presents ED with a chief complaint of dyspnea and fatigue.  Patient reports that started about 3 days ago.  Been constant, and is not getting progressively worse.  Patient reports no associated cough, chest pain, dizziness, paresthesias.  He denies melena, hematochezia, epistaxis, bleeding from gums, large bruises.  Patient has never had a blood transfusion in the past.  He denies any pain or fever.  Patient reports his dyspnea is worse with exertion, better with rest.  He has no other complaints at this time. ?  ?Patient does not smoke, does not drink alcohol, does not use illicit drugs.  He is vaccinated for COVID.  Patient is full code. ? ?Assessment and Plan: ?* Symptomatic anemia ?- Hemoglobin 4.6 ?-In the setting of upper GI bleed due to NSAIDs usage. ?-Patient received 3 units of PRBCs on 05/22/2021; reports improvement in his dyspnea feelings; no chest pain and demonstrating good oxygen saturation on room air.. ?-Patient  reports no further overt bleeding; nursing staff expressed that patient is still having some melanotic loose stools (but significantly less and improved in quality) ?-Hemoglobin 8.1 at time of discharge; improved and trending up. ?-Patient has completed 72 hours of IV PPI infusion as recommended by GI service and feels ready to go home. ?-Discharged on PPI twice a day along with Carafate. ?-Outpatient follow-up with gastroenterology service ?-Repeat CBC to follow hemoglobin trend as an outpatient ?-Advised to avoid the use of NSAIDs and to maintain adequate hydration.  ? ?Anemia associated with acute blood loss ?- In the setting of upper GI bleed due to NSAIDs usage. ?-Stabilized with 3 units of PRBC transfusion during hospitalization ?-Hemoglobin 8.6 at discharge ?-No active bleeding appreciated from gastric and duodenal ulcers during endoscopic evaluation. ?-Patient advised to avoid the use of NSAIDs. ?-Safe to go home and follow-up as an outpatient with GI service ? ?GI bleed ?-Upper GI bleed in the setting of stomach and duodenal ulcers due to NSAID usage. ?-Status post endoscopic evaluation by GI service ?-Patient will follow-up with GI as an outpatient ?-Continue PPI twice a day and the use of Carafate for 10 days ? ? ?Type 2 diabetes mellitus ([facility]) ?-A1c 8.3 ?-Advised to follow modified carbohydrate diet ?-Resume home hypoglycemic agents. ? ?Dementia ([place]) ?-Continue Aricept and Namenda ?-Continue constant reorientation. ? ? ?Melena ?-In the setting of upper GI bleed ?-Endoscopic evaluation demonstrated not active bleeding; melanotic stools could be due to still in transit blood in his intestines. ?-Continue to follow hemoglobin trend; further transfuse as needed. ?-We will follow-up with GI service as an outpatient. ? ?Essential hypertension ?-  Fairly well-controlled blood pressure and rising. ?-Safe to resume home antihypertensive agents at discharge. ?-Advised to follow heart healthy  diet. ? ? ? ?Consultants: Gastroenterology service ?Procedures performed: Endoscopic evaluation demonstrating gastric and duodenal ulcers most likely secondary to the use of NSAIDs. ?Disposition: Discharged home with family care and instruction to follow-up with PCP in 10 days. ? ?Diet recommendation:  ?Heart healthy modified carbohydrate diet. ? ?DISCHARGE MEDICATION: ?Allergies as of 05/24/2021   ? ?   Reactions  ? Penicillin G Nausea And Vomiting  ? ?  ? ?  ?Medication List  ?  ? ?STOP taking these medications   ? ?amLODipine 10 MG tablet ?Commonly known as: NORVASC ?  ?loperamide 2 MG tablet ?Commonly known as: Imodium A-D ?  ? ?  ? ?TAKE these medications   ? ?diphenoxylate-atropine 2.5-0.025 MG tablet ?Commonly known as: LOMOTIL ?TAKE 2 AFTER FIRST ONSET OF DIARRHEA OR LOOSE STOOL, THEN 1 AFTER EACH LOOSE STOOL. ?  ?donepezil 10 MG tablet ?Commonly known as: ARICEPT ?TAKE (1) TABLET BY MOUTH AT BEDTIME. ?What changed:  ?how much to take ?how to take this ?when to take this ?additional instructions ?  ?HYDROcodone-acetaminophen 5-325 MG tablet ?Commonly known as: NORCO/VICODIN ?Take 1 tablet by mouth every 8 (eight) hours as needed for moderate pain. ?  ?insulin glargine 100 UNIT/ML injection ?Commonly known as: LANTUS ?Inject 30 Units into the skin at bedtime. ?  ?memantine 10 MG tablet ?Commonly known as: Namenda ?Take 1 tablet (10 mg total) by mouth 2 (two) times daily. ?  ?metoprolol succinate 50 MG 24 hr tablet ?Commonly known as: TOPROL-XL ?Take 50 mg by mouth daily. Take with or immediately following a meal. ?  ?pantoprazole 40 MG tablet ?Commonly known as: Protonix ?Take 1 tablet (40 mg total) by mouth 2 (two) times daily. ?  ?potassium chloride SA 20 MEQ tablet ?Commonly known as: KLOR-CON M ?Take 1 tablet (20 mEq total) by mouth daily. ?  ?sucralfate 1 GM/10ML suspension ?Commonly known as: CARAFATE ?Take 10 mLs (1 g total) by mouth in the morning, at noon, and at bedtime for 10 days. ?  ? ?  ? ?  Follow-up Information   ? ? [name], MD. Schedule an appointment as soon as possible for a visit in 10 day(s).   ?Specialty: Family Medicine ?Contact information: ?[address] ?[place] [address] ?[phone] ? ? ?  ?  ? ?  ?  ? ?  ? ?Discharge Exam: ?Filed Weights  ? 05/22/21 0105  ?Weight: 80 kg  ? ?General exam: Alert, awake and able to follow commands appropriately; oriented to person and place.  No chest pain, no nausea, no vomiting, no shortness of breath.  Patient reports no abdominal pain. ?Respiratory system: Clear to auscultation. Respiratory effort normal.  Good saturation on room air. ?Cardiovascular system:RRR. No murmurs, rubs, gallops.  No JVD. ?Gastrointestinal system: Abdomen is nondistended, soft and nontender. No organomegaly or masses felt. Normal bowel sounds heard. ?Central nervous system: Alert and oriented. No focal neurological deficits. ?Extremities: No cyanosis or clubbing. ?Skin: No petechiae. ?Psychiatry: Judgement and insight appear normal. Mood & affect appropriate.  ? ? ?Condition at discharge: Stable and improved. ? ?The results of significant diagnostics from this hospitalization (including imaging, microbiology, ancillary and laboratory) are listed below for reference.  ? ?Imaging Studies: ?DG Chest Port 1 View ? ?Result Date: 05/22/2021 ?CLINICAL DATA:  Shortness of breath, history of colorectal cancer EXAM: PORTABLE CHEST 1 VIEW COMPARISON:  PET-CT from 04/03/2021 FINDINGS: Cardiac  shadow is within normal limits. Left-sided chest wall port is noted in satisfactory position. No focal infiltrate or effusion is noted. Degenerative changes of the thoracic spine are noted. IMPRESSION: No acute abnormality noted. Electronically Signed   By: Inez Catalina M.D.   On: 05/22/2021 02:08   ? ?Microbiology: ?Results for orders placed or performed during the hospital encounter of 06/26/19  ?Urine culture     Status: None  ? Collection Time: 06/26/19  8:31 PM  ? Specimen: Urine,  Clean Catch  ?Result Value Ref Range Status  ? Specimen Description   Final  ?  URINE, CLEAN CATCH ?Performed at [facility], [address] [place], [place] [address] ?  ? Special Requests   Final  ?  NONE ?Performed at [facility]

## 2021-05-24 NOTE — Progress Notes (Signed)
Discussed discharge with patients wife. No further questions at this time.  ?

## 2021-05-26 LAB — H. PYLORI ANTIBODY, IGG: H Pylori IgG: 0.07 Index Value (ref 0.00–0.79)

## 2021-05-26 NOTE — Telephone Encounter (Signed)
Office apt schd 07/03/21 at 345, apt letter reminder mailed to patient ?

## 2021-06-05 ENCOUNTER — Other Ambulatory Visit (HOSPITAL_COMMUNITY): Payer: Self-pay

## 2021-06-05 MED ORDER — HYDROCODONE-ACETAMINOPHEN 5-325 MG PO TABS
1.0000 | ORAL_TABLET | Freq: Three times a day (TID) | ORAL | 0 refills | Status: AC | PRN
Start: 2021-06-05 — End: ?

## 2021-06-10 DIAGNOSIS — G9389 Other specified disorders of brain: Secondary | ICD-10-CM | POA: Diagnosis not present

## 2021-06-10 DIAGNOSIS — C20 Malignant neoplasm of rectum: Secondary | ICD-10-CM | POA: Diagnosis not present

## 2021-06-18 ENCOUNTER — Emergency Department (HOSPITAL_COMMUNITY)
Admission: EM | Admit: 2021-06-18 | Discharge: 2021-06-18 | Disposition: A | Discharge status: Home / Self Care | Payer: Medicare Other | Attending: Emergency Medicine | Admitting: Emergency Medicine

## 2021-06-18 ENCOUNTER — Encounter (HOSPITAL_COMMUNITY): Payer: Self-pay | Admitting: Hematology

## 2021-06-18 ENCOUNTER — Encounter (HOSPITAL_COMMUNITY): Payer: Self-pay

## 2021-06-18 ENCOUNTER — Emergency Department (HOSPITAL_COMMUNITY): Payer: Medicare Other

## 2021-06-18 ENCOUNTER — Other Ambulatory Visit: Payer: Self-pay

## 2021-06-18 DIAGNOSIS — R000 Tachycardia, unspecified: Secondary | ICD-10-CM | POA: Diagnosis not present

## 2021-06-18 DIAGNOSIS — Z794 Long term (current) use of insulin: Secondary | ICD-10-CM | POA: Diagnosis not present

## 2021-06-18 DIAGNOSIS — E119 Type 2 diabetes mellitus without complications: Secondary | ICD-10-CM | POA: Insufficient documentation

## 2021-06-18 DIAGNOSIS — F0390 Unspecified dementia without behavioral disturbance: Secondary | ICD-10-CM | POA: Insufficient documentation

## 2021-06-18 DIAGNOSIS — I10 Essential (primary) hypertension: Secondary | ICD-10-CM | POA: Insufficient documentation

## 2021-06-18 DIAGNOSIS — Z20822 Contact with and (suspected) exposure to covid-19: Secondary | ICD-10-CM | POA: Diagnosis not present

## 2021-06-18 DIAGNOSIS — R531 Weakness: Secondary | ICD-10-CM | POA: Diagnosis not present

## 2021-06-18 DIAGNOSIS — R791 Abnormal coagulation profile: Secondary | ICD-10-CM | POA: Diagnosis not present

## 2021-06-18 DIAGNOSIS — Z79899 Other long term (current) drug therapy: Secondary | ICD-10-CM | POA: Diagnosis not present

## 2021-06-18 DIAGNOSIS — Z85038 Personal history of other malignant neoplasm of large intestine: Secondary | ICD-10-CM | POA: Insufficient documentation

## 2021-06-18 LAB — CBC WITH DIFFERENTIAL/PLATELET
Abs Immature Granulocytes: 0.03 10*3/uL (ref 0.00–0.07)
Basophils Absolute: 0 10*3/uL (ref 0.0–0.1)
Basophils Relative: 0 %
Eosinophils Absolute: 0 10*3/uL (ref 0.0–0.5)
Eosinophils Relative: 1 %
HCT: 28.6 % — ABNORMAL LOW (ref 39.0–52.0)
Hemoglobin: 9.5 g/dL — ABNORMAL LOW (ref 13.0–17.0)
Immature Granulocytes: 1 %
Lymphocytes Relative: 9 %
Lymphs Abs: 0.5 10*3/uL — ABNORMAL LOW (ref 0.7–4.0)
MCH: 29.3 pg (ref 26.0–34.0)
MCHC: 33.2 g/dL (ref 30.0–36.0)
MCV: 88.3 fL (ref 80.0–100.0)
Monocytes Absolute: 0.5 10*3/uL (ref 0.1–1.0)
Monocytes Relative: 9 %
Neutro Abs: 4.5 10*3/uL (ref 1.7–7.7)
Neutrophils Relative %: 80 %
Platelets: 226 10*3/uL (ref 150–400)
RBC: 3.24 MIL/uL — ABNORMAL LOW (ref 4.22–5.81)
RDW: 14.7 % (ref 11.5–15.5)
WBC: 5.5 10*3/uL (ref 4.0–10.5)
nRBC: 0 % (ref 0.0–0.2)

## 2021-06-18 LAB — COMPREHENSIVE METABOLIC PANEL
ALT: 9 U/L (ref 0–44)
AST: 12 U/L — ABNORMAL LOW (ref 15–41)
Albumin: 2.8 g/dL — ABNORMAL LOW (ref 3.5–5.0)
Alkaline Phosphatase: 123 U/L (ref 38–126)
Anion gap: 7 (ref 5–15)
BUN: 14 mg/dL (ref 8–23)
CO2: 26 mmol/L (ref 22–32)
Calcium: 8.9 mg/dL (ref 8.9–10.3)
Chloride: 105 mmol/L (ref 98–111)
Creatinine, Ser: 1.27 mg/dL — ABNORMAL HIGH (ref 0.61–1.24)
GFR, Estimated: >60 mL/min (ref >60)
Glucose, Bld: 137 mg/dL — ABNORMAL HIGH (ref 70–99)
Potassium: 3.5 mmol/L (ref 3.5–5.1)
Sodium: 138 mmol/L (ref 135–145)
Total Bilirubin: 0.9 mg/dL (ref 0.3–1.2)
Total Protein: 5.8 g/dL — ABNORMAL LOW (ref 6.5–8.1)

## 2021-06-18 LAB — URINALYSIS, ROUTINE W REFLEX MICROSCOPIC
Bilirubin Urine: NEGATIVE
Glucose, UA: NEGATIVE mg/dL
Hgb urine dipstick: NEGATIVE
Ketones, ur: NEGATIVE mg/dL
Leukocytes,Ua: NEGATIVE
Nitrite: NEGATIVE
Protein, ur: 30 mg/dL — AB
Specific Gravity, Urine: 1.013 (ref 1.005–1.030)
pH: 5 (ref 5.0–8.0)

## 2021-06-18 LAB — RESP PANEL BY RT-PCR (FLU A&B, COVID) ARPGX2
Influenza A by PCR: NEGATIVE
Influenza B by PCR: NEGATIVE
SARS Coronavirus 2 by RT PCR: NEGATIVE

## 2021-06-18 LAB — TYPE AND SCREEN
ABO/RH(D): A POS
Antibody Screen: NEGATIVE

## 2021-06-18 LAB — PROTIME-INR
INR: 1.1 (ref 0.8–1.2)
Prothrombin Time: 13.8 seconds (ref 11.4–15.2)

## 2021-06-18 LAB — POC OCCULT BLOOD, ED: Fecal Occult Bld: NEGATIVE

## 2021-06-18 LAB — LACTIC ACID, PLASMA: Lactic Acid, Venous: 0.9 mmol/L (ref 0.5–1.9)

## 2021-06-18 LAB — LIPASE, BLOOD: Lipase: 20 U/L (ref 11–51)

## 2021-06-18 MED ORDER — SODIUM CHLORIDE 0.9 % IV BOLUS
1000.0000 mL | Freq: Once | INTRAVENOUS | Status: AC
  Administered 2021-06-18: 1000 mL via INTRAVENOUS

## 2021-06-18 NOTE — ED Provider Notes (Signed)
?Troy ?Provider Smith ? ? ?CSN: [id number] ?Arrival date & time: 06/18/21  1124 ? ?  ? ?History ? ?Chief Complaint  ?Patient presents with  ? Weakness  ? ? ?Troy Smith is a 66 y.o. male. ? ?HPI ? ?With medical history including hypertension, adenocarcinoma of the sigmoid colon, Port-A-Cath in place, diabetes, dementia presents with complaints of generalized weakness.  Patient is a poor historian but states that he is having no complaints at this time. ? ?Patient's wife is at bedside she endorses that since he got out of the hospital last month due to an upper GI bleed he has been having declining health, states that he has been very tired and fatigued,  last week he was not getting much out of bed and has been sleeping more, she states that he has not been eating or drinking very much states that he is just not angry, he has had no nausea vomiting constipation or worsening diarrhea, she denies hematochezia, melena, hematemesis or coffee-ground emesis.  She denies any abnormal bleeding or bruising, she states that he has not had any fevers chills cough congestion general body aches no recent sick contacts.  She endorses that he is currently not receiving any radiation/chemotherapy at this time.  She states that he has dementia at baseline but he seems more confused than usual states that he cannot remember how long he has been living in his house or what her name was, she denies any unilateral weakness or difficulty speaking falls or off balance. ? ?I reviewed patient's chart he was seen last month for anemia due to upper GI bleed, he received 3 units of blood, underwent endoscopy revealed nonbleeding ulcer, and was later sent home.  On Protonix and Carafate. ? ?Home Medications ?Prior to Admission medications   ?Medication Sig Start Date End Date Taking? Authorizing Provider  ?donepezil (ARICEPT) 10 MG tablet TAKE (1) TABLET BY MOUTH AT BEDTIME. ?Patient taking differently: Take 10 mg  by mouth at bedtime. 05/14/21  Yes Suzzanne Cloud, NP  ?HYDROcodone-acetaminophen (NORCO/VICODIN) 5-325 MG tablet Take 1 tablet by mouth every 8 (eight) hours as needed for moderate pain. 06/05/21  Yes Derek Jack, MD  ?insulin glargine (LANTUS) 100 UNIT/ML injection Inject 30 Units into the skin at bedtime.    Yes [provider]  ?memantine (NAMENDA) 10 MG tablet Take 1 tablet (10 mg total) by mouth 2 (two) times daily. 05/14/21  Yes Suzzanne Cloud, NP  ?metoprolol succinate (TOPROL-XL) 50 MG 24 hr tablet Take 50 mg by mouth daily. Take with or immediately following a meal.   Yes [provider]  ?pantoprazole (PROTONIX) 40 MG tablet Take 1 tablet (40 mg total) by mouth 2 (two) times daily. 05/24/21 05/24/22 Yes Barton Dubois, MD  ?diphenoxylate-atropine (LOMOTIL) 2.5-0.025 MG tablet TAKE 2 AFTER FIRST ONSET OF DIARRHEA OR LOOSE STOOL, THEN 1 AFTER EACH LOOSE STOOL. ?Patient not taking: Reported on 05/22/2021 07/31/20   Heath Lark, MD  ?potassium chloride SA (KLOR-CON) 20 MEQ tablet Take 1 tablet (20 mEq total) by mouth daily. ?Patient taking differently: Take 20 mEq by mouth daily as needed. 09/04/20   Derek Jack, MD  ?sucralfate (CARAFATE) 1 GM/10ML suspension Take 10 mLs (1 g total) by mouth in the morning, at noon, and at bedtime for 10 days. ?Patient not taking: Reported on 06/18/2021 05/24/21 06/03/21  Barton Dubois, MD  ?   ? ?Allergies    ?Penicillin g   ? ?Review of Systems   ?  Review of Systems  ?Unable to perform ROS: Dementia  ? ?Physical Exam ?Updated Vital Signs ?BP (!) 141/90   Pulse 83   Temp 99.5 ?F (37.5 ?C) (Rectal)   Resp 18   Ht '5\' 11"'$  (1.803 m)   Wt 79.4 kg   SpO2 96%   BMI 24.41 kg/m?  ?Physical Exam ?Vitals and nursing Smith reviewed. Exam conducted with a chaperone present.  ?Constitutional:   ?   General: He is not in acute distress. ?   Appearance: He is ill-appearing.  ?   Comments: Chronically ill-appearing, pale but no acute distress  ?HENT:  ?   Head:  Normocephalic and atraumatic.  ?   Nose: No congestion.  ?   Mouth/Throat:  ?   Mouth: Mucous membranes are dry.  ?   Pharynx: Oropharynx is clear. No oropharyngeal exudate or posterior oropharyngeal erythema.  ?Eyes:  ?   Extraocular Movements: Extraocular movements intact.  ?   Conjunctiva/sclera: Conjunctivae normal.  ?   Pupils: Pupils are equal, round, and reactive to light.  ?Cardiovascular:  ?   Rate and Rhythm: Regular rhythm. Tachycardia present.  ?   Pulses: Normal pulses.  ?   Heart sounds: No murmur heard. ?  No friction rub. No gallop.  ?Pulmonary:  ?   Effort: No respiratory distress.  ?   Breath sounds: No wheezing, rhonchi or rales.  ?   Comments: No evidence of respiratory distress nontachypneic nonhypoxic lung sounds are clear bilaterally. ?Abdominal:  ?   Palpations: Abdomen is soft.  ?   Tenderness: There is no abdominal tenderness. There is no right CVA tenderness or left CVA tenderness.  ?Genitourinary: ?   Comments: With chaperone present rectal exam was performed no noted external hemorrhoids present, digital rectal exam was performed no palpable mass or large stool burden, no frank hematochezia no melena Hemoccult negative rectal temperature was 99.5 ?Skin: ?   General: Skin is warm and dry.  ?Neurological:  ?   Mental Status: He is alert.  ?   Comments: Alert and orient x3, no facial asymmetry no difficulty with word finding following two-step commands no unilateral weakness present.  ?Psychiatric:     ?   Mood and Affect: Mood normal.  ? ? ?ED Results / Procedures / Treatments   ?Labs ?(all labs ordered are listed, but only abnormal results are displayed) ?Labs Reviewed  ?COMPREHENSIVE METABOLIC PANEL - Abnormal; Notable for the following components:  ?    Result Value  ? Glucose, Bld 137 (*)   ? Creatinine, Ser 1.27 (*)   ? Total Protein 5.8 (*)   ? Albumin 2.8 (*)   ? AST 12 (*)   ? All other components within normal limits  ?CBC WITH DIFFERENTIAL/PLATELET - Abnormal; Notable for the  following components:  ? RBC 3.24 (*)   ? Hemoglobin 9.5 (*)   ? HCT 28.6 (*)   ? Lymphs Abs 0.5 (*)   ? All other components within normal limits  ?URINALYSIS, ROUTINE W REFLEX MICROSCOPIC - Abnormal; Notable for the following components:  ? Protein, ur 30 (*)   ? Bacteria, UA RARE (*)   ? All other components within normal limits  ?CULTURE, BLOOD (ROUTINE X 2)  ?CULTURE, BLOOD (ROUTINE X 2)  ?RESP PANEL BY RT-PCR (FLU A&B, COVID) [id number]  ?LIPASE, BLOOD  ?LACTIC ACID, PLASMA  ?PROTIME-INR  ?POC OCCULT BLOOD, ED  ?TYPE AND SCREEN  ? ? ?EKG ?EKG Interpretation ? ?Date/Time:  Wednesday Jun 18 2021  12:51:47 EDT ?Ventricular Rate:  96 ?PR Interval:  158 ?QRS Duration: 91 ?QT Interval:  361 ?QTC Calculation: 457 ?R Axis:   -10 ?Text Interpretation: Sinus arrhythmia Minimal ST depression, lateral leads Baseline wander in lead(s) I II aVR V2 No significant change since prior 4/23 Confirmed by Aletta Edouard [phone]) on 06/18/2021 12:59:26 PM ? ?Radiology ?CT Head Wo Contrast ? ?Result Date: 06/18/2021 ?CLINICAL DATA:  Mental status change, unknown cause EXAM: CT HEAD WITHOUT CONTRAST TECHNIQUE: Contiguous axial images were obtained from the base of the skull through the vertex without intravenous contrast. RADIATION DOSE REDUCTION: This exam was performed according to the departmental dose-optimization program which includes automated exposure control, adjustment of the mA and/or kV according to patient size and/or use of iterative reconstruction technique. COMPARISON:  CT head Jun 26, 2019. FINDINGS: Brain: No evidence of acute large vascular territory infarction, hemorrhage, hydrocephalus, extra-axial collection or mass lesion/mass effect. Chronic microvascular ischemic disease and cerebral atrophy. Vascular: Calcific intracranial atherosclerosis. No hyperdense vessel identified. Skull: No acute fracture. Sinuses/Orbits: Near complete opacification of the left sphenoid sinus with surrounding osteitis, suggestive chronic  sinusitis. There is also near complete opacification of left anterior ethmoid air cells and left frontal sinus as well as the left sphenoid sinus. Other: No mastoid effusions. IMPRESSION: 1. No evidence of acute

## 2021-06-18 NOTE — Discharge Instructions (Addendum)
Lab work imaging were all reassuring, please continue with all home medications. ? ?Please follow-up with PCP for further evaluation. ? ?Come back to the emergency department if you develop chest pain, shortness of breath, severe abdominal pain, uncontrolled nausea, vomiting, diarrhea. ? ?

## 2021-06-18 NOTE — ED Notes (Signed)
EDP at bedside  

## 2021-06-18 NOTE — ED Notes (Signed)
Pt provided a warm blanket 

## 2021-06-18 NOTE — ED Triage Notes (Addendum)
Per EMS, Pt, from home, c/o increasing weakness and nausea x several days.  Denies pain.  Hx of bleeding ulcer and colon CA.  No CA treatment since Oct 2022.  Pt has chronic diarrhea.   ? ?Pt's wife reports Pt has been confused this morning.  ?

## 2021-06-18 NOTE — ED Notes (Signed)
Pt attempted to provide a urine sample w/o success.  Pt provided ice water per request.  ?

## 2021-06-23 ENCOUNTER — Telehealth (INDEPENDENT_AMBULATORY_CARE_PROVIDER_SITE_OTHER): Payer: Self-pay

## 2021-06-23 ENCOUNTER — Encounter (HOSPITAL_COMMUNITY): Payer: BC Managed Care – PPO

## 2021-06-23 ENCOUNTER — Telehealth (HOSPITAL_COMMUNITY): Payer: Self-pay | Admitting: *Deleted

## 2021-06-23 LAB — CULTURE, BLOOD (ROUTINE X 2)
Culture: NO GROWTH
Culture: NO GROWTH
Special Requests: ADEQUATE
Special Requests: ADEQUATE

## 2021-06-23 NOTE — Telephone Encounter (Signed)
I spoke with the patient wife she states she did not really need [place] as of now as she has the patient an appointment scheduled May 16,2023 with his Onc due to weakness. If they decide they need [place] they will call the office back.  ?

## 2021-06-23 NOTE — Telephone Encounter (Signed)
Wife Shirlean Mylar called to advise that his condition continues to decline and he is not eating, sleeping a lot and has difficulty ambulating.  Has been to the ER multiple times over the last month and would like to talk to Dr. Delton Coombes about options for him, at this point.  Appointment made for port flush/lab, as he missed appt today due to weakness.  Made her aware to call 911 if symptoms progress and have him evaluated in the emergency room.  Verbalized understanding. ?

## 2021-06-24 ENCOUNTER — Inpatient Hospital Stay (HOSPITAL_COMMUNITY): Payer: Medicare Other

## 2021-06-24 ENCOUNTER — Inpatient Hospital Stay (HOSPITAL_COMMUNITY): Payer: Medicare Other | Attending: Hematology | Admitting: Hematology

## 2021-06-24 ENCOUNTER — Encounter (HOSPITAL_COMMUNITY): Payer: Self-pay | Admitting: Hematology

## 2021-06-24 VITALS — Wt 158.8 lb

## 2021-06-24 DIAGNOSIS — C787 Secondary malignant neoplasm of liver and intrahepatic bile duct: Secondary | ICD-10-CM | POA: Diagnosis not present

## 2021-06-24 DIAGNOSIS — C187 Malignant neoplasm of sigmoid colon: Secondary | ICD-10-CM | POA: Insufficient documentation

## 2021-06-24 DIAGNOSIS — C7951 Secondary malignant neoplasm of bone: Secondary | ICD-10-CM | POA: Insufficient documentation

## 2021-06-24 DIAGNOSIS — Z79899 Other long term (current) drug therapy: Secondary | ICD-10-CM | POA: Diagnosis not present

## 2021-06-24 LAB — COMPREHENSIVE METABOLIC PANEL
ALT: 12 U/L (ref 0–44)
AST: 12 U/L — ABNORMAL LOW (ref 15–41)
Albumin: 3.2 g/dL — ABNORMAL LOW (ref 3.5–5.0)
Alkaline Phosphatase: 107 U/L (ref 38–126)
Anion gap: 7 (ref 5–15)
BUN: 10 mg/dL (ref 8–23)
CO2: 26 mmol/L (ref 22–32)
Calcium: 9.1 mg/dL (ref 8.9–10.3)
Chloride: 104 mmol/L (ref 98–111)
Creatinine, Ser: 1.2 mg/dL (ref 0.61–1.24)
GFR, Estimated: >60 mL/min (ref >60)
Glucose, Bld: 143 mg/dL — ABNORMAL HIGH (ref 70–99)
Potassium: 3.4 mmol/L — ABNORMAL LOW (ref 3.5–5.1)
Sodium: 137 mmol/L (ref 135–145)
Total Bilirubin: 1.1 mg/dL (ref 0.3–1.2)
Total Protein: 6.3 g/dL — ABNORMAL LOW (ref 6.5–8.1)

## 2021-06-24 LAB — CBC WITH DIFFERENTIAL/PLATELET
Abs Immature Granulocytes: 0.03 10*3/uL (ref 0.00–0.07)
Basophils Absolute: 0 10*3/uL (ref 0.0–0.1)
Basophils Relative: 0 %
Eosinophils Absolute: 0.2 10*3/uL (ref 0.0–0.5)
Eosinophils Relative: 3 %
HCT: 29.7 % — ABNORMAL LOW (ref 39.0–52.0)
Hemoglobin: 9.4 g/dL — ABNORMAL LOW (ref 13.0–17.0)
Immature Granulocytes: 1 %
Lymphocytes Relative: 12 %
Lymphs Abs: 0.7 10*3/uL (ref 0.7–4.0)
MCH: 28 pg (ref 26.0–34.0)
MCHC: 31.6 g/dL (ref 30.0–36.0)
MCV: 88.4 fL (ref 80.0–100.0)
Monocytes Absolute: 0.4 10*3/uL (ref 0.1–1.0)
Monocytes Relative: 7 %
Neutro Abs: 4.4 10*3/uL (ref 1.7–7.7)
Neutrophils Relative %: 77 %
Platelets: 253 10*3/uL (ref 150–400)
RBC: 3.36 MIL/uL — ABNORMAL LOW (ref 4.22–5.81)
RDW: 14.2 % (ref 11.5–15.5)
WBC: 5.6 10*3/uL (ref 4.0–10.5)
nRBC: 0 % (ref 0.0–0.2)

## 2021-06-24 MED ORDER — HEPARIN SOD (PORK) LOCK FLUSH 100 UNIT/ML IV SOLN
500.0000 [IU] | Freq: Once | INTRAVENOUS | Status: AC
  Administered 2021-06-24: 500 [IU] via INTRAVENOUS

## 2021-06-24 MED ORDER — SODIUM CHLORIDE 0.9% FLUSH
10.0000 mL | Freq: Once | INTRAVENOUS | Status: AC
  Administered 2021-06-24: 10 mL via INTRAVENOUS

## 2021-06-24 NOTE — Progress Notes (Signed)
? ?[facility] ?[address] ?[place], [place] [address] ? ? ?CLINIC:  ?Medical Oncology/Hematology ? ?PCP:  ?Troy Sites, MD ?[address] / [place] [address] ?[phone] ? ? ?REASON FOR VISIT:  ?Follow-up for metastatic colon cancer with local recurrence ? ?PRIOR THERAPY: Chemotherapy followed by chemoradiation ? ?NGS Results: not done ? ?CURRENT THERAPY: surveillance ? 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 CT abd/pelvis- Focal sigmoid diverticulitis is noted without abscess formation. ?Focal narrowing and wall thickening of the sigmoid colon is noted in ?this area most likely due to inflammation, but neoplasm cannot be ?excluded, and sigmoidoscopy is recommended for further evaluation. ?  ?03/20/2016 Procedure  ? Colonoscopy by Dr. Laural Golden- Malignant partially obstructing tumor in the distal sigmoid colon. Biopsied. ?- Incomplete exam. ? ?  ?03/23/2016 Pathology Results  ? Colon, biopsy, sigmoid mass ?- ADENOCARCINOMA. ? ?  ?03/23/2016 Procedure  ? Low anterior resection with transanal anastomosis ? ?  ?03/26/2016 Pathology Results  ? Colon, segmental resection for tumor, sigmoid ?- INVASIVE WELL DIFFERENTIATED ADENOCARCINOMA, SPANNING 5 CM IN GREATEST DIMENSION. ?- TUMOR INVADES THROUGH MUSCULARIS PROPRIA TO INVOLVE SUBSEROSAL SOFT TISSUES. ?- UNDERLYING MARKED ACUTE AND CHRONIC INFLAMMATION WITH GIANT CELL REACTION AND ABSCESS ?FORMATION EXTENDING TO THE SEROSAL SURFACE AND INVOLVING THE MESENTERIC TISSUE. ?- MARGINS ARE NEGATIVE FOR TUMOR. ?- FOURTEEN BENIGN LYMPH NODES WHICH DEMONSTRATE VARIABLE INVOLVEMENT WITH ACUTE AND CHRONIC ?INFLAMMATION WITH ASSOCIATED GIANT CELLS WITHOUT TUMOR IDENTIFIED (0/14). ? ?  ?04/08/2016 Cancer Staging  ? 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 ?  ?07/09/2016 Imaging  ? CT abd/pelvis- No evidence of metastatic disease or other acute findings. ? ?Bilateral nonobstructing renal calculi and mild bilateral renal ?parenchymal scarring. ? ?Stable mildly enlarged prostate gland and findings of chronic ?bladder outlet obstruction. ? ?  ?08/16/2018 -  Chemotherapy  ?  Patient is on Treatment Plan: COLORECTAL FOLFOXIRI  ? ?  ? ?  ? ? ?CANCER STAGING: ? 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 ? ?INTERVAL HISTORY:  ?[name], a 66 y.o. male, returns for routine follow-up of his metastatic colon cancer with local recurrence. [name] was last seen on 03/26/2021.  ? ?Today he reports feeling fair. He reports fatigue, SOB, and low appetite. He reports pain in his ankles, legs, and sacrum. He denies nausea and vomiting. He is sleeping for a majority of the day. He has lost 37 lbs since 2/15. He has 1 fall in April and hit his knee. He requires assistance of stand, and walking any distance causes SOB.  ? ?REVIEW OF SYSTEMS:  ?Review of Systems  ?Constitutional:  Positive for appetite change and fatigue.  ?Respiratory:  Positive for shortness of breath.   ?Gastrointestinal:  Positive for diarrhea. Negative for nausea and vomiting.  ?Musculoskeletal:  Positive for arthralgias (ankles, legs, and sacrum).  ?All other systems reviewed and are negative. ? ?PAST MEDICAL/SURGICAL HISTORY:  ?Past Medical History:  ?Diagnosis Date  ? 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 Carpal tunnel syndrome 05/02/2014  ? Bilateral  ? Cervical disc disorder with radiculopathy of cervical region 05/02/2014  ?  Left C8  ? Colon cancer ([place]) 06/2020  ? Dementia ([place]) 11/08/2020  ? Diabetes mellitus without complication ([place])   ? Hypertension   ? Port-A-Cath in place 08/10/2018   ? ?Past Surgical History:  ?Procedure Laterality Date  ? appendectomy  2012  ? BIOPSY  03/20/2016  ? Procedure: BIOPSY;  Surgeon: Rogene Houston, MD;  [facility];  Service: Endoscopy;;  colon  ? BOWEL RESECTION N/A 03/23/2016  ? Procedure: LOW ANTERIOR RESECTION;  Surgeon: Aviva Signs, MD;  Location: [facility] OR[facility];  Service: General;  Laterality: N/A;  ? COLON SURGERY  01/2019  ? [facility] at [facility]  ? COLONOSCOPY N/A 03/20/2016  ? Procedure: COLONOSCOPY;  Surgeon: Rogene Houston, MD;  [facility]: [facility];  Service: Endoscopy;  Laterality: N/A;  730  ? COLONOSCOPY N/A 07/10/2016  ? Procedure: COLONOSCOPY;  Surgeon: Rogene Houston, MD;  [facility];  Service: Endoscopy;  Laterality: N/A;  1040  ? ESOPHAGOGASTRODUODENOSCOPY (EGD) WITH PROPOFOL N/A 05/22/2021  ? Procedure: ESOPHAGOGASTRODUODENOSCOPY (EGD) WITH PROPOFOL;  Surgeon: Rogene Houston, MD;  [facility];  Service: Endoscopy;  Laterality: N/A;  ? NO PAST SURGERIES    ? POLYPECTOMY  07/10/2016  ? Procedure: POLYPECTOMY;  Surgeon: Rogene Houston, MD;  [facility];  Service: Endoscopy;;  colon  ? PORTACATH PLACEMENT Left 08/05/2018  ? Procedure: INSERTION PORT-A-CATH;  Surgeon: Aviva Signs, MD;  Location: [facility];  Service: General;  Laterality: Left;  ? TONSILLECTOMY    ? ? ?SOCIAL HISTORY:  ?Social History  ? ?Socioeconomic History  ? Marital status: Married  ?  Spouse name: Not on file  ? Number of children: Not on file  ? Years of education: Not on file  ? Highest education level: Not on file  ?Occupational History  ? Not on file  ?Tobacco Use  ? Smoking status: Never  ? Smokeless tobacco: Never  ?Vaping Use  ? Vaping Use: Never used  ?Substance and Sexual Activity  ? Alcohol use: Yes  ?  Comment: rarely  ? Drug use: No  ? Sexual activity: Not on file  ?Other Topics Concern  ? Not on file  ?Social History Narrative  ? Not on file  ? ?Social Determinants of Health  ? ?Financial Resource Strain: Not on file   ?Food Insecurity: Not on file  ?Transportation Needs: Not on file  ?Physical Activity: Not on file  ?Stress: Not on file  ?Social Connections: Not on file  ?Intimate Partner Violence: Not on file  ? ? ?FAMILY HISTORY:  ?Family History  ?Problem Relation Age of Onset  ? COPD Mother   ? Melanoma Father   ? ? ?CURRENT MEDICATIONS:  ?Current Outpatient Medications  ?Medication Sig Dispense Refill  ? diphenoxylate-atropine (LOMOTIL) 2.5-0.025 MG tablet TAKE 2 AFTER FIRST ONSET OF DIARRHEA OR LOOSE STOOL, THEN 1 AFTER EACH LOOSE STOOL. 60 tablet 0  ? donepezil (ARICEPT) 10 MG tablet TAKE (1) TABLET BY MOUTH AT BEDTIME. (Patient taking differently: Take 10 mg by mouth at bedtime.) 90 tablet 3  ? HYDROcodone-acetaminophen (NORCO/VICODIN) 5-325 MG tablet Take 1 tablet by mouth every 8 (eight) hours as needed for moderate pain. 30 tablet 0  ? insulin glargine (LANTUS) 100 UNIT/ML injection Inject 30 Units into the skin at bedtime.     ? memantine (NAMENDA) 10 MG tablet Take 1 tablet (10 mg total) by mouth 2 (two) times daily. 180 tablet 3  ? metoprolol succinate (TOPROL-XL) 50 MG 24  hr tablet Take 50 mg by mouth daily. Take with or immediately following a meal.    ? pantoprazole (PROTONIX) 40 MG tablet Take 1 tablet (40 mg total) by mouth 2 (two) times daily. 60 tablet 2  ? potassium chloride SA (KLOR-CON) 20 MEQ tablet Take 1 tablet (20 mEq total) by mouth daily. (Patient taking differently: Take 20 mEq by mouth daily as needed.) 30 tablet 3  ? sucralfate (CARAFATE) 1 GM/10ML suspension Take 10 mLs (1 g total) by mouth in the morning, at noon, and at bedtime for 10 days. (Patient not taking: Reported on 06/18/2021) 300 mL 0  ? ?No current facility-administered medications for this visit.  ? ?Facility-Administered Medications Ordered in Other Visits  ?Medication Dose Route Frequency Provider Last Rate Last Admin  ? heparin lock flush 100 unit/mL  500 Units Intracatheter Once PRN Derek Jack, MD      ? heparin lock  flush 100 unit/mL  500 Units Intravenous Once Derek Jack, MD      ? sodium chloride flush (NS) 0.9 % injection 10 mL  10 mL Intracatheter PRN Derek Jack, MD      ? ? ?ALLERGIES:

## 2021-06-24 NOTE — Progress Notes (Signed)
Patients port flushed without difficulty.  Good blood return noted with no bruising or swelling noted at site.  Band aid applied.  VSS with discharge and left in satisfactory condition with no s/s of distress noted.   

## 2021-06-24 NOTE — Patient Instructions (Addendum)
[facility] ?Discharge Instructions ? ?You were seen and examined today by Dr. Delton Coombes. ? ?Dr. Delton Coombes discussed how you have been feeling recently. Your hemoglobin is stable, there is no need for a transfusion. ? ?Dr. Delton Coombes is concerned that it seems like you have had a recent decline in your health status. This is possibly related to the cancer. Dr. Delton Coombes has ordered a CEA lab check and a PET scan to assess the current stage of the cancer. ? ?Please follow-up as scheduled after the PET scan. ? ? ? ?Thank you for choosing [facility] at [facility] to provide your oncology and hematology care.  To afford each patient quality time with our provider, please arrive at least 15 minutes before your scheduled appointment time.  ? ?If you have a lab appointment with the [facility] please come in thru the Main Entrance and check in at the main information desk. ? ?You need to re-schedule your appointment should you arrive 10 or more minutes late.  We strive to give you quality time with our providers, and arriving late affects you and other patients whose appointments are after yours.  Also, if you no show three or more times for appointments you may be dismissed from the clinic at the providers discretion.     ?Again, thank you for choosing [facility].  Our hope is that these requests will decrease the amount of time that you wait before being seen by our physicians.       ?_____________________________________________________________ ? ?Should you have questions after your visit to [facility], please contact our office at [phone] and follow the prompts.  Our office hours are 8:00 a.m. and 4:30 p.m. Monday - Friday.  Please note that voicemails left after 4:00 p.m. may not be returned until the following business day.  We are closed weekends and major holidays.  You do have access to a nurse 24-7, just call the  main number to the clinic [phone] and do not press any options, hold on the line and a nurse will answer the phone.   ? ?For prescription refill requests, have your pharmacy contact our office and allow 72 hours.   ? ?Due to Covid, you will need to wear a mask upon entering the hospital. If you do not have a mask, a mask will be given to you at the Main Entrance upon arrival. For doctor visits, patients may have 1 support person age 108 or older with them. For treatment visits, patients can not have anyone with them due to social distancing guidelines and our immunocompromised population.  ? ? ? ?

## 2021-06-24 NOTE — Patient Instructions (Signed)
[facility]  Discharge Instructions: °Thank you for choosing [facility] to provide your oncology and hematology care.  °If you have a lab appointment with the Cancer Center, please come in thru the Main Entrance and check in at the main information desk. ° °Wear comfortable clothing and clothing appropriate for easy access to any Portacath or PICC line.  ° °We strive to give you quality time with your provider. You may need to reschedule your appointment if you arrive late (15 or more minutes).  Arriving late affects you and other patients whose appointments are after yours.  Also, if you miss three or more appointments without notifying the office, you may be dismissed from the clinic at the provider’s discretion.    °  °For prescription refill requests, have your pharmacy contact our office and allow 72 hours for refills to be completed.   ° °Today you received the following chemotherapy and/or immunotherapy agents PORT flush    °  °To help prevent nausea and vomiting after your treatment, we encourage you to take your nausea medication as directed. ° °BELOW ARE SYMPTOMS THAT SHOULD BE REPORTED IMMEDIATELY: °*FEVER GREATER THAN 100.4 F (38 °C) OR HIGHER °*CHILLS OR SWEATING °*NAUSEA AND VOMITING THAT IS NOT CONTROLLED WITH YOUR NAUSEA MEDICATION °*UNUSUAL SHORTNESS OF BREATH °*UNUSUAL BRUISING OR BLEEDING °*URINARY PROBLEMS (pain or burning when urinating, or frequent urination) °*BOWEL PROBLEMS (unusual diarrhea, constipation, pain near the anus) °TENDERNESS IN MOUTH AND THROAT WITH OR WITHOUT PRESENCE OF ULCERS (sore throat, sores in mouth, or a toothache) °UNUSUAL RASH, SWELLING OR PAIN  °UNUSUAL VAGINAL DISCHARGE OR ITCHING  ° °Items with * indicate a potential emergency and should be followed up as soon as possible or go to the Emergency Department if any problems should occur. ° °Please show the CHEMOTHERAPY ALERT CARD or IMMUNOTHERAPY ALERT CARD at check-in to the Emergency  Department and triage nurse. ° °Should you have questions after your visit or need to cancel or reschedule your appointment, please contact [facility] [phone]  and follow the prompts.  Office hours are 8:00 a.m. to 4:30 p.m. Monday - Friday. Please note that voicemails left after 4:00 p.m. may not be returned until the following business day.  We are closed weekends and major holidays. You have access to a nurse at all times for urgent questions. Please call the main number to the clinic [phone] and follow the prompts. ° °For any non-urgent questions, you may also contact your provider using MyChart. We now offer e-Visits for anyone 18 and older to request care online for non-urgent symptoms. For details visit [email or web address]. °  °Also download the MyChart app! Go to the app store, search "MyChart", open the app, select [facility], and log in with your MyChart username and password. ° °Due to Covid, a mask is required upon entering the hospital/clinic. If you do not have a mask, one will be given to you upon arrival. For doctor visits, patients may have 1 support person aged 18 or older with them. For treatment visits, patients cannot have anyone with them due to current Covid guidelines and our immunocompromised population.  °

## 2021-06-25 LAB — CEA: CEA: 225 ng/mL — ABNORMAL HIGH (ref 0.0–4.7)

## 2021-06-26 ENCOUNTER — Encounter (HOSPITAL_COMMUNITY): Payer: Self-pay

## 2021-06-26 ENCOUNTER — Encounter (HOSPITAL_COMMUNITY)
Admission: RE | Admit: 2021-06-26 | Discharge: 2021-06-26 | Disposition: A | Discharge status: Home / Self Care | Payer: Medicare Other | Source: Ambulatory Visit | Attending: Hematology | Admitting: Hematology

## 2021-06-26 DIAGNOSIS — C187 Malignant neoplasm of sigmoid colon: Secondary | ICD-10-CM | POA: Diagnosis not present

## 2021-06-26 DIAGNOSIS — C189 Malignant neoplasm of colon, unspecified: Secondary | ICD-10-CM | POA: Diagnosis not present

## 2021-06-26 MED ORDER — FLUDEOXYGLUCOSE F - 18 (FDG) INJECTION
11.9000 | Freq: Once | INTRAVENOUS | Status: AC | PRN
  Administered 2021-06-26: 8.8 via INTRAVENOUS

## 2021-06-30 ENCOUNTER — Telehealth (HOSPITAL_COMMUNITY): Payer: Self-pay | Admitting: Dietician

## 2021-06-30 ENCOUNTER — Inpatient Hospital Stay (HOSPITAL_COMMUNITY): Payer: Medicare Other | Admitting: Dietician

## 2021-06-30 NOTE — Telephone Encounter (Signed)
Nutrition  Patient identified on MST (malnutrition screening tool report) for weight loss.   Spoke with wife of patient via telephone. Introduced self and services available at [facility]. Wife appreciative of call. She reports patient is currently sleeping but wife agreeable to telephone visit. Wife reports patient has eaten very little in the last 2 weeks. Patient sleeping most of the day. This morning he one bite of a biscuit before returning to bed. Patient has tried oral nutrition supplements in the past, but does not like these. Wife is familiar with CIB as she use to drink them. RD offered to leave samples and handouts for pick up at scheduled MD visit tomorrow (5/23). Wife appreciative, but reports she has left a message with request to change mode of visit as patient is too weak to come into office.   Message sent to scheduling and navigator about changing 5/23 appointment Discussed strategies for poor appetite and ways to add calories/protein to foods, making the most of every bite Suggested offering small bites/sips q2h - will mail handout with ideas and shake recipes  Provided contact information, encouraged to contact with questions or concerns  Lajuan Lines, RD, [place] [phone]

## 2021-07-01 ENCOUNTER — Inpatient Hospital Stay (HOSPITAL_BASED_OUTPATIENT_CLINIC_OR_DEPARTMENT_OTHER): Payer: Medicare Other | Admitting: Hematology

## 2021-07-01 VITALS — BP 102/78 | HR 93 | Temp 98.4°F | Resp 18

## 2021-07-01 DIAGNOSIS — C187 Malignant neoplasm of sigmoid colon: Secondary | ICD-10-CM

## 2021-07-01 MED ORDER — TAMSULOSIN HCL 0.4 MG PO CAPS: 0.4000 mg | ORAL_CAPSULE | Freq: Every day | ORAL | 6 refills | Status: AC

## 2021-07-01 NOTE — Progress Notes (Signed)
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 Focal narrowing and wall thickening of the sigmoid colon is noted in this area most likely due to inflammation, but neoplasm cannot be excluded, and sigmoidoscopy is recommended for further evaluation.   03/20/2016 Procedure   Colonoscopy by Dr. Laural Golden- Malignant partially obstructing tumor in the distal sigmoid colon. Biopsied. - Incomplete exam.    03/23/2016 Pathology Results   Colon, biopsy, sigmoid mass - ADENOCARCINOMA.    03/23/2016 Procedure   Low anterior resection with transanal anastomosis    03/26/2016 Pathology Results   Colon, segmental resection for tumor, sigmoid - INVASIVE WELL DIFFERENTIATED ADENOCARCINOMA, SPANNING 5 CM IN GREATEST DIMENSION. - TUMOR INVADES THROUGH MUSCULARIS PROPRIA TO INVOLVE SUBSEROSAL SOFT TISSUES. - UNDERLYING MARKED ACUTE AND CHRONIC INFLAMMATION WITH GIANT CELL REACTION AND ABSCESS FORMATION EXTENDING TO THE SEROSAL SURFACE AND INVOLVING THE MESENTERIC TISSUE. - MARGINS ARE NEGATIVE FOR TUMOR. - FOURTEEN BENIGN LYMPH NODES WHICH DEMONSTRATE VARIABLE INVOLVEMENT WITH ACUTE AND CHRONIC INFLAMMATION WITH ASSOCIATED GIANT CELLS WITHOUT TUMOR IDENTIFIED (0/14).    04/08/2016 Cancer Staging   Cancer Staging Adenocarcinoma of sigmoid colon [facility]) Staging form: Colon and Rectum, AJCC 8th Edition - Pathologic stage from 04/01/2016: Stage IIA (pT3, pN0, cM0) - Signed by Baird Cancer, PA-C on 04/01/2016    07/09/2016 Imaging   CT abd/pelvis- No evidence of metastatic disease or other acute findings.  Bilateral nonobstructing renal calculi and mild bilateral renal parenchymal scarring.  Stable mildly enlarged prostate gland and findings of chronic bladder outlet obstruction.    08/16/2018 -  Chemotherapy    Patient is on Treatment Plan: COLORECTAL FOLFOXIRI          CANCER STAGING:  Cancer Staging  Adenocarcinoma of sigmoid colon [facility]) Staging form: Colon and Rectum, AJCC 8th Edition - Pathologic stage from 04/01/2016: Stage IIA (pT3, pN0, cM0) - Signed by Baird Cancer, PA-C on 04/01/2016   INTERVAL HISTORY:  Troy Smith, a 66 y.o. male, returns for routine follow-up of his metastatic colon cancer with local recurrence. Peng was last seen on 06/24/2021.   Today he reports feeling fair. He reports severe fatigue and SOB. He is not eating well, and his appetite is poor. He reports pain in his buttock when seated. He denies new pains. He denies difficulty with urination.   REVIEW OF SYSTEMS:  Review of Systems  Constitutional:  Positive for appetite change and fatigue.  Respiratory:  Positive for shortness of breath.   Gastrointestinal:  Positive for diarrhea.  Genitourinary:  Negative for difficulty urinating.   Musculoskeletal:  Positive for myalgias (buttock).  All other systems reviewed and are negative.  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[place]) 03/23/2016   Carpal tunnel syndrome 05/02/2014   Bilateral   Cervical disc disorder with radiculopathy of cervical region 05/02/2014   Left C8   Colon cancer ([place]) 06/2020   Dementia ([place]) 11/08/2020   Diabetes mellitus without complication ([place])    Hypertension  Port-A-Cath in place 08/10/2018   Past Surgical History:  Procedure Laterality Date   appendectomy  2012   BIOPSY  03/20/2016   Procedure: BIOPSY;   Surgeon: Rogene Houston, MD;  Location[facility];  Service: Endoscopy;;  colon   BOWEL RESECTION N/A 03/23/2016   Procedure: LOW ANTERIOR RESECTION;  Surgeon: Aviva Signs, MD;  Location: [facility] ORS;  Service: General;  Laterality: N/A;   COLON SURGERY  01/2019   Done at [facility] 03/20/2016   Procedure: COLONOSCOPY;  Surgeon: Rogene Houston, MD;  Location: [facility] END[facility] SU[facility];  Service: Endoscopy;  Laterality: N/A;  730   COLONOSCOPY N/A 07/10/2016   Procedure: COLONOSCOPY;  Surgeon: Rogene Houston, MD;  Location: [facility];  Service: Endoscopy;  Laterality: N/A;  1040   ESOPHAGOGASTRODUODENOSCOPY (EGD) WITH PROPOFOL N/A 05/22/2021   Procedure: ESOPHAGOGASTRODUODENOSCOPY (EGD) WITH PROPOFOL;  Surgeon: Rogene Houston, MD;  Location[facility];  Service: Endoscopy;  Laterality: N/A;   NO PAST SURGERIES     POLYPECTOMY  07/10/2016   Procedure: POLYPECTOMY;  Surgeon: Rogene Houston, MD;  Location[facility];  Service: Endoscopy;;  colon   PORTACATH PLACEMENT Left 08/05/2018   Procedure: INSERTION PORT-A-CATH;  Surgeon: Aviva Signs, MD;  Location: [facility] ORS;  Service: General;  Laterality: Left;   TONSILLECTOMY      SOCIAL HISTORY:  Social History   Socioeconomic History   Marital status: Married    Spouse name: Not on file   Number of children: Not on file   Years of education: Not on file   Highest education level: Not on file  Occupational History   Not on file  Tobacco Use   Smoking status: Never   Smokeless tobacco: Never  Vaping Use   Vaping Use: Never used  Substance and Sexual Activity   Alcohol use: Yes    Comment: rarely   Drug use: No   Sexual activity: Not on file  Other Topics Concern   Not on file  Social History Narrative   Not on file   Social Determinants of Health   Financial Resource Strain: Not on file  Food Insecurity: Not on file  Transportation Needs: Not on file  Physical Activity: Not on file  Stress: Not on  file  Social Connections: Not on file  Intimate Partner Violence: Not on file    FAMILY HISTORY:  Family History  Problem Relation Age of Onset   COPD Mother    Melanoma Father     CURRENT MEDICATIONS:  Current Outpatient Medications  Medication Sig Dispense Refill   diphenoxylate-atropine (LOMOTIL) 2.5-0.025 MG tablet TAKE 2 AFTER FIRST ONSET OF DIARRHEA OR LOOSE STOOL, THEN 1 AFTER EACH LOOSE STOOL. 60 tablet 0   donepezil (ARICEPT) 10 MG tablet TAKE (1) TABLET BY MOUTH AT BEDTIME. (Patient taking differently: Take 10 mg by mouth at bedtime.) 90 tablet 3   HYDROcodone-acetaminophen (NORCO/VICODIN) 5-325 MG tablet Take 1 tablet by mouth every 8 (eight) hours as needed for moderate pain. 30 tablet 0   insulin glargine (LANTUS) 100 UNIT/ML injection Inject 30 Units into the skin at bedtime.      memantine (NAMENDA) 10 MG tablet Take 1 tablet (10 mg total) by mouth 2 (two) times daily. 180 tablet 3   metoprolol succinate (TOPROL-XL) 50 MG 24 hr tablet Take 50 mg by mouth daily. Take with or immediately following a meal.     pantoprazole (PROTONIX) 40 MG tablet Take 1 tablet (  40 mg total) by mouth 2 (two) times daily. 60 tablet 2   potassium chloride SA (KLOR-CON) 20 MEQ tablet Take 1 tablet (20 mEq total) by mouth daily. (Patient taking differently: Take 20 mEq by mouth daily as needed.) 30 tablet 3   sucralfate (CARAFATE) 1 GM/10ML suspension Take 10 mLs (1 g total) by mouth in the morning, at noon, and at bedtime for 10 days. (Patient not taking: Reported on 06/18/2021) 300 mL 0   No current facility-administered medications for this visit.   Facility-Administered Medications Ordered in Other Visits  Medication Dose Route Frequency Provider Last Rate Last Admin   heparin lock flush 100 unit/mL  500 Units Intracatheter Once PRN Derek Jack, MD       sodium chloride flush (NS) 0.9 % injection 10 mL  10 mL Intracatheter PRN Derek Jack, MD        ALLERGIES:  Allergies   Allergen Reactions   Penicillin G Nausea And Vomiting    PHYSICAL EXAM:  Performance status (ECOG): 1 - Symptomatic but completely ambulatory  Vitals:   07/01/21 1043  BP: 102/78  Pulse: 93  Resp: 18  Temp: 98.4 F (36.9 C)  SpO2: 99%   Wt Readings from Last 3 Encounters:  06/24/21 158 lb 12.8 oz (72 kg)  06/18/21 175 lb (79.4 kg)  05/22/21 176 lb 5.9 oz (80 kg)   Physical Exam Vitals reviewed.  Constitutional:      Appearance: Normal appearance.     Comments: In wheelchair  Cardiovascular:     Rate and Rhythm: Normal rate and regular rhythm.     Pulses: Normal pulses.     Heart sounds: Normal heart sounds.  Pulmonary:     Effort: Pulmonary effort is normal.     Breath sounds: Normal breath sounds.  Neurological:     General: No focal deficit present.     Mental Status: He is alert and oriented to person, place, and time.  Psychiatric:        Mood and Affect: Mood normal.        Behavior: Behavior normal.     LABORATORY DATA:  I have reviewed the labs as listed.     Latest Ref Rng & Units 06/24/2021   11:05 AM 06/18/2021   12:43 PM 05/24/2021    5:11 AM  CBC  WBC 4.0 - 10.5 K/uL 5.6   5.5   8.2    Hemoglobin 13.0 - 17.0 g/dL 9.4   9.5   8.1    Hematocrit 39.0 - 52.0 % 29.7   28.6   26.3    Platelets 150 - 400 K/uL 253   226   169        Latest Ref Rng & Units 06/24/2021   11:05 AM 06/18/2021   12:43 PM 05/22/2021    9:55 AM  CMP  Glucose 70 - 99 mg/dL 143   137   102    BUN 8 - 23 mg/dL '10   14   30    '$ Creatinine 0.61 - 1.24 mg/dL 1.20   1.27   1.19    Sodium 135 - 145 mmol/L 137   138   142    Potassium 3.5 - 5.1 mmol/L 3.4   3.5   4.1    Chloride 98 - 111 mmol/L 104   105   110    CO2 22 - 32 mmol/L '26   26   24    '$ Calcium 8.9 - 10.3 mg/dL 9.1  8.9   9.1    Total Protein 6.5 - 8.1 g/dL 6.3   5.8   5.5    Total Bilirubin 0.3 - 1.2 mg/dL 1.1   0.9   1.1    Alkaline Phos 38 - 126 U/L 107   123   124    AST 15 - 41 U/L '12   12   13    '$ ALT 0 - 44 U/L  '12   9   11      '$ DIAGNOSTIC IMAGING:  I have independently reviewed the scans and discussed with the patient. CT Head Wo Contrast  Result Date: 06/18/2021 CLINICAL DATA:  Mental status change, unknown cause EXAM: CT HEAD WITHOUT CONTRAST TECHNIQUE: Contiguous axial images were obtained from the base of the skull through the vertex without intravenous contrast. RADIATION DOSE REDUCTION: This exam was performed according to the departmental dose-optimization program which includes automated exposure control, adjustment of the mA and/or kV according to patient size and/or use of iterative reconstruction technique. COMPARISON:  CT head Jun 26, 2019. FINDINGS: Brain: No evidence of acute large vascular territory infarction, hemorrhage, hydrocephalus, extra-axial collection or mass lesion/mass effect. Chronic microvascular ischemic disease and cerebral atrophy. Vascular: Calcific intracranial atherosclerosis. No hyperdense vessel identified. Skull: No acute fracture. Sinuses/Orbits: Near complete opacification of the left sphenoid sinus with surrounding osteitis, suggestive chronic sinusitis. There is also near complete opacification of left anterior ethmoid air cells and left frontal sinus as well as the left sphenoid sinus. Other: No mastoid effusions. IMPRESSION: 1. No evidence of acute intracranial abnormality. 2. Findings suggestive of left ostiomeatal unit pattern and left sphenoid sinusitis, described above. Electronically Signed   By: Margaretha Sheffield M.D.   On: 06/18/2021 13:35   NM PET Image Restag (PS) Skull Base To Thigh  Result Date: 06/27/2021 CLINICAL DATA:  Subsequent treatment strategy for colon cancer metastatic to liver. EXAM: NUCLEAR MEDICINE PET SKULL BASE TO THIGH TECHNIQUE: 8.8 mCi F-18 FDG was injected intravenously. Full-ring PET imaging was performed from the skull base to thigh after the radiotracer. CT data was obtained and used for attenuation correction and anatomic localization.  Fasting blood glucose: 153 mg/dl COMPARISON:  04/03/2021. FINDINGS: Mediastinal blood pool activity: SUV max 2.4 Liver activity: SUV max NA NECK: No abnormal hypermetabolism. Incidental CT findings: Mucosal thickening and an air-fluid level in the left maxillary sinus. CHEST: No abnormal hypermetabolism. Incidental CT findings: Left IJ Port-A-Cath terminates in the low SVC. Atherosclerotic calcification of the aortic valve and coronary arteries. Heart is at the upper limits of normal in size. Trace pericardial effusion. No pleural effusion. A few scattered tiny pulmonary nodules are unchanged and too small for PET resolution. ABDOMEN/PELVIS: New low-attenuation lesion in the dome of the right hepatic lobe measures 1.7 x 2.2 cm (3/126), SUV max 8 4. No additional abnormal hypermetabolism in the liver, adrenal glands, spleen or pancreas. No hypermetabolic lymph nodes. Mild haziness and nodularity in the anterior left iliac fossa does not show metabolism above blood pool. Soft tissue thickening anterior to the upper sacrum, in the region of surgical clips (3/219), SUV max 4.9, compared to 3.6 previously. Amorphous nature makes measurement difficult. It does not appear to be extension of soft tissue new now to the posterior bladder wall (3/221). Incidental CT findings: Liver is otherwise unremarkable. Cholecystectomy. Adrenal glands are normal. Stones in the kidneys bilaterally. New moderate bilateral hydronephrosis, likely secondary to extrinsic compression or encasement by marked stents of presacral soft tissue, which now extends to the  posterior bladder wall (3/221). Stones are seen dependently in the bladder. A nodular enlarged prostate indents the bladder. Spleen, pancreas and stomach are grossly unremarkable. Postoperative changes in the distal colon. Lower presacral edema. SKELETON: Destruction and sclerosis involving the S1 vertebral body with an associated fracture and increased metabolism, SUV max 6.7,  compared to 4.3 previously. New focal hypermetabolism in the L5 vertebral body, SUV max 9.2. Incidental CT findings: Degenerative changes in the spine. IMPRESSION: 1. New hepatic metastasis. 2. Progressive S1 metastasis.  New L5 metastasis. 3. Slight increase in extent and hypermetabolism involving presacral soft tissue thickening, indicative of disease progression. New bilateral hydronephrosis is likely associated. 4. New trace pericardial effusion. 5. Bilateral renal stones and bladder stones. 6. Nodular enlarged prostate indents the bladder. 7. Aortic atherosclerosis (ICD10-I70.0). Coronary artery calcification. Electronically Signed   By: Lorin Picket M.D.   On: 06/27/2021 15:10   DG Chest Portable 1 View  Result Date: 06/18/2021 CLINICAL DATA:  Lethargic.  Bibasilar rales. EXAM: PORTABLE CHEST 1 VIEW COMPARISON:  AP chest 05/22/2021 FINDINGS: Left chest wall porta catheter is again seen with single lead overlying the mid superior vena cava. Cardiac silhouette and mediastinal contours are within normal limits with mild calcification within aortic arch. Mildly decreased lung volumes, unchanged. Mild bibasilar bronchovascular crowding. No new focal airspace opacity to indicate pneumonia. No pleural effusion or pneumothorax. Unchanged partial visualization of proximal right femoral metadiaphyseal bone infarct versus enchondroma. Mild-to-moderate multilevel degenerative disc changes of the thoracic spine. Cholecystectomy clips. IMPRESSION: No significant change from prior. No acute lung process. Mild hypoinflation with mild bibasilar subsegmental atelectasis versus scarring. Electronically Signed   By: Yvonne Kendall M.D.   On: 06/18/2021 15:00     ASSESSMENT:  1.  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 -Laparoscopic partial  hepatectomy, redo low anterior resection on 02/01/2019, pathology showing YPT3Y PN 0.  Positive circumferential/radial/mesenteric margin. -6 more cycles of FOLFOX completed on 05/31/2019. -Xeloda with radiation from 06/26/2019 through 08/03/2019. -CT CAP on 09/14/2019 shows postoperative changes in the rectosigmoid resection and reanastomosis with interval increase in posttreatment perirectal and presacral soft tissue thickening consistent with radiation changes.  No adenopathy.  Multiple stable small bilateral pulmonary nodules, likely benign.  No new evidence of metastatic disease.  Unchanged splenomegaly. - PET scan on 04/08/2020 done secondary to increase in CEA shows abnormal hypermetabolic soft tissue in the presacral region suspicious for recurrence.  Sclerosis involving sacrum.  Small left axillary lymph nodes favored to be reactive. - MRI pelvis on 04/26/2020 with enhancing lesion involving S1 segment of the sacrum concerning for metastatic disease.  Peripherally enhancing soft tissue nodule within the presacral soft tissues measuring 2.4 x 2.1 cm. - PET scan on 07/01/2020 showed hypermetabolic soft tissue lesion in the presacral space shows interval progression.  Destructive changes in the sacrum also mildly progressive.  Interval development of diffuse marrow uptake, likely from growth factor. - I talked to Dr. Oneita Kras from radiation oncology at [facility].  She has recommended 2 to 3 months of chemotherapy followed by repeat pelvic MRI. - Reviewed CT chest and abdomen from 09/16/2020 which showed stable small bilateral lung nodules measuring 4 mm and smaller, most likely benign.  No evidence of metastatic disease in the chest or abdomen. - Reviewed MRI pelvis with and without contrast from 09/16/2024.  Although it is reported  that there is progression of the presacral mass, this MRI was compared to MRI from 04/26/2020.  He started treatment towards the end of May.  The presacral mass has slightly improved  compared to the PET scan which was done around the time of starting chemotherapy in May.  However the sacral lesion has increased in size measuring 9.9 x 5.8 cm. - XRT to the pelvis from 10/28/2020 through 11/18/2020 with Xeloda.   PLAN:  1.  Metastatic sigmoid colon cancer to the liver: - Reviewed labs which showed CEA of 225. - Reviewed images of the PET scan from 06/26/2021 which showed new liver metastasis and progressive S1 metastasis and new L5 metastasis. - He is confused at times. - He has poor performance status.  Not a candidate for any therapy. - We discussed best supportive care in the form of hospice.  Patient and wife agreeable.  We will make referral to [facility]    Orders placed this encounter:  No orders of the defined types were placed in this encounter.    Derek Jack, MD [place] [phone]   I, Thana Ates, am acting as a scribe for Dr. Derek Jack.  I, Derek Jack MD, have reviewed the above documentation for accuracy and completeness, and I agree with the above.

## 2021-07-01 NOTE — Patient Instructions (Addendum)
[place] at [facility] Discharge Instructions   You were seen and examined today by Dr. Delton Coombes.  He reviewed the results of your PET scan. The results show that the cancer has spread in your liver, and there are also spread in your sacrum.   Dr. Raliegh Ip recommends hospice/comfort care as you are too weak to endure any more treatments.   Please call the clinic should you have any questions or concerns in the future.    Thank you for choosing [place] at [facility] to provide your oncology and hematology care.  To afford each patient quality time with our provider, please arrive at least 15 minutes before your scheduled appointment time.   If you have a lab appointment with the [facility] please come in thru the Main Entrance and check in at the main information desk.  You need to re-schedule your appointment should you arrive 10 or more minutes late.  We strive to give you quality time with our providers, and arriving late affects you and other patients whose appointments are after yours.  Also, if you no show three or more times for appointments you may be dismissed from the clinic at the providers discretion.     Again, thank you for choosing [facility].  Our hope is that these requests will decrease the amount of time that you wait before being seen by our physicians.       _____________________________________________________________  Should you have questions after your visit to [facility], please contact our office at [phone] and follow the prompts.  Our office hours are 8:00 a.m. and 4:30 p.m. Monday - Friday.  Please note that voicemails left after 4:00 p.m. may not be returned until the following business day.  We are closed weekends and major holidays.  You do have access to a nurse 24-7, just call the main number to the clinic [phone] and do not press any options, hold on the line and a nurse  will answer the phone.    For prescription refill requests, have your pharmacy contact our office and allow 72 hours.    Due to Covid, you will need to wear a mask upon entering the hospital. If you do not have a mask, a mask will be given to you at the Main Entrance upon arrival. For doctor visits, patients may have 1 support person age 20 or older with them. For treatment visits, patients can not have anyone with them due to social distancing guidelines and our immunocompromised population.

## 2021-07-03 ENCOUNTER — Ambulatory Visit (INDEPENDENT_AMBULATORY_CARE_PROVIDER_SITE_OTHER): Payer: BC Managed Care – PPO | Admitting: Gastroenterology

## 2021-07-31 ENCOUNTER — Other Ambulatory Visit (HOSPITAL_COMMUNITY): Payer: BC Managed Care – PPO

## 2021-08-05 ENCOUNTER — Ambulatory Visit (HOSPITAL_COMMUNITY): Payer: BC Managed Care – PPO | Admitting: Hematology

## 2021-08-09 DEATH — deceased

## 2021-08-15 ENCOUNTER — Encounter (HOSPITAL_COMMUNITY): Payer: Self-pay | Admitting: Hematology

## 2021-09-01 ENCOUNTER — Other Ambulatory Visit: Payer: Self-pay

## 2021-09-09 ENCOUNTER — Other Ambulatory Visit: Payer: Self-pay

## 2021-11-19 ENCOUNTER — Ambulatory Visit: Payer: BC Managed Care – PPO | Admitting: Neurology
